# Patient Record
Sex: Male | Born: 1940
Health system: Southern US, Community
[De-identification: ages and names within clinical notes are randomized; demographics above are authoritative.]

## PROBLEM LIST (undated history)

## (undated) DIAGNOSIS — N183 Chronic kidney disease, stage 3 unspecified: Secondary | ICD-10-CM

## (undated) DIAGNOSIS — E785 Hyperlipidemia, unspecified: Secondary | ICD-10-CM

## (undated) DIAGNOSIS — I2510 Atherosclerotic heart disease of native coronary artery without angina pectoris: Secondary | ICD-10-CM

## (undated) DIAGNOSIS — T7840XA Allergy, unspecified, initial encounter: Secondary | ICD-10-CM

## (undated) DIAGNOSIS — I4819 Other persistent atrial fibrillation: Secondary | ICD-10-CM

## (undated) DIAGNOSIS — I10 Essential (primary) hypertension: Secondary | ICD-10-CM

## (undated) DIAGNOSIS — R945 Abnormal results of liver function studies: Secondary | ICD-10-CM

## (undated) DIAGNOSIS — E669 Obesity, unspecified: Secondary | ICD-10-CM

## (undated) DIAGNOSIS — I5042 Chronic combined systolic (congestive) and diastolic (congestive) heart failure: Secondary | ICD-10-CM

## (undated) DIAGNOSIS — S2249XA Multiple fractures of ribs, unspecified side, initial encounter for closed fracture: Secondary | ICD-10-CM

## (undated) DIAGNOSIS — J189 Pneumonia, unspecified organism: Secondary | ICD-10-CM

## (undated) HISTORY — DX: Multiple fractures of ribs, unspecified side, initial encounter for closed fracture: S22.49XA

## (undated) HISTORY — DX: Hyperlipidemia, unspecified: E78.5

## (undated) HISTORY — DX: Atherosclerotic heart disease of native coronary artery without angina pectoris: I25.10

## (undated) HISTORY — DX: Allergy, unspecified, initial encounter: T78.40XA

## (undated) HISTORY — DX: Hypocalcemia: E83.51

## (undated) HISTORY — DX: Pneumonia, unspecified organism: J18.9

## (undated) HISTORY — DX: Obesity, unspecified: E66.9

## (undated) HISTORY — DX: Essential (primary) hypertension: I10

## (undated) HISTORY — DX: Abnormal results of liver function studies: R94.5

---
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
## 1973-05-15 HISTORY — PX: APPENDECTOMY: SHX54

## 1983-05-16 HISTORY — PX: RETINAL LASER PROCEDURE: SHX2339

## 2000-12-06 ENCOUNTER — Encounter: Admission: RE | Admit: 2000-12-06 | Discharge: 2000-12-06 | Payer: Self-pay | Admitting: Family Medicine

## 2000-12-06 ENCOUNTER — Encounter: Payer: Self-pay | Admitting: Family Medicine

## 2004-12-14 ENCOUNTER — Ambulatory Visit: Payer: Self-pay

## 2005-03-28 ENCOUNTER — Ambulatory Visit (HOSPITAL_COMMUNITY): Admission: RE | Admit: 2005-03-28 | Discharge: 2005-03-29 | Payer: Self-pay | Admitting: Ophthalmology

## 2007-10-01 ENCOUNTER — Encounter (INDEPENDENT_AMBULATORY_CARE_PROVIDER_SITE_OTHER): Payer: Self-pay | Admitting: Ophthalmology

## 2007-10-01 ENCOUNTER — Ambulatory Visit (HOSPITAL_COMMUNITY): Admission: RE | Admit: 2007-10-01 | Discharge: 2007-10-02 | Payer: Self-pay | Admitting: Ophthalmology

## 2008-05-15 HISTORY — PX: CATARACT EXTRACTION: SUR2

## 2009-05-23 ENCOUNTER — Emergency Department (HOSPITAL_COMMUNITY): Admission: EM | Admit: 2009-05-23 | Discharge: 2009-05-23 | Payer: Self-pay | Admitting: Emergency Medicine

## 2010-07-27 ENCOUNTER — Encounter: Payer: Self-pay | Admitting: Family Medicine

## 2010-08-02 NOTE — Letter (Signed)
Summary: Records Dated 10-10 thru 7-11/[facility]  Records Dated 10-10 thru 7-11/[facility]   Imported By: Lanelle Bal 07/27/2010 14:19:42  _____________________________________________________________________  External Attachment:    Type:   Image     Comment:   External Document

## 2010-08-04 ENCOUNTER — Ambulatory Visit (INDEPENDENT_AMBULATORY_CARE_PROVIDER_SITE_OTHER): Payer: Medicare Other | Admitting: Family Medicine

## 2010-08-04 ENCOUNTER — Encounter: Payer: Self-pay | Admitting: Family Medicine

## 2010-08-04 DIAGNOSIS — R0989 Other specified symptoms and signs involving the circulatory and respiratory systems: Secondary | ICD-10-CM

## 2010-08-04 DIAGNOSIS — E785 Hyperlipidemia, unspecified: Secondary | ICD-10-CM

## 2010-08-04 DIAGNOSIS — I10 Essential (primary) hypertension: Secondary | ICD-10-CM

## 2010-08-04 DIAGNOSIS — E669 Obesity, unspecified: Secondary | ICD-10-CM

## 2010-08-04 NOTE — Patient Instructions (Addendum)
Diabetes, Type 2 Diabetes is a lasting (chronic) disease. In type 2 diabetes, the pancreas does not make enough insulin (a hormone), and the body does not respond normally to the insulin that is made. This type of diabetes was also previously called adult onset diabetes. About 90% of all those who have diabetes have type 2. It usually occurs after the age of 22 but can occur at any age. CAUSES Unlike type 1 diabetes, which happens because insulin is no longer being made, type 2 diabetes happens because the body is making less insulin and has trouble using the insulin properly. SYMPTOMS  Drinking more than usual.   Urinating more than usual.   Blurred vision.   Dry, itchy skin.   Frequent infection like yeast infections in women.   More tired than usual (fatigue).  TREATMENT  Healthy eating.   Exercise.   Medication, if needed.   Monitoring blood glucose (sugar).   Seeing your caregiver regularly.  HOME CARE INSTRUCTIONS  Check your blood glucose (sugar) at least once daily. More frequent monitoring may be necessary, depending on your medications and on how well your diabetes is controlled. Your caregiver will advise you.   Take your medicine as directed by your caregiver.   Do not smoke.   Make wise food choices. Ask your caregiver for information. Weight loss can improve your diabetes.   Learn about low blood glucose (hypoglycemia) and how to treat it.   Get your eyes checked regularly.   Have a yearly physical exam. Have your blood pressure checked. Get your blood and urine tested.   Wear a pendant or bracelet saying that you have diabetes.   Check your feet every night for sores. Let your caregiver know if you have sores that are not healing.  SEEK MEDICAL CARE IF:  You are having problems keeping your blood glucose at target range.   You feel you might be having problems with your medicines.   You have symptoms of an illness that is not improving after 24  hours.   You have a sore or wound that is not healing.   You notice a change in vision or a new problem with your vision.   You develop a fever of more than 104F.  Document Released: 05/01/2005 Document Re-Released: 05/23/2009 [facility] Patient Information 2011 [place], [place].  Check BP upon arrival home and takes meds, check BP daily for the rest of the week and if it remains elevated over 150/90 call for further instructions Check BS daily and as needed Release of Records Maryruth Hancock Mezawski (sp?) Release of Records Dr Jimmey Ralph, Optometry, last visit.  Release of Records Dr Thayer Headings  Will check insurance and administer Zostavax at next visit.

## 2010-08-05 ENCOUNTER — Encounter: Payer: Self-pay | Admitting: Family Medicine

## 2010-08-05 ENCOUNTER — Other Ambulatory Visit (INDEPENDENT_AMBULATORY_CARE_PROVIDER_SITE_OTHER): Payer: BLUE CROSS/BLUE SHIELD

## 2010-08-05 DIAGNOSIS — J4530 Mild persistent asthma, uncomplicated: Secondary | ICD-10-CM | POA: Insufficient documentation

## 2010-08-05 DIAGNOSIS — T7840XA Allergy, unspecified, initial encounter: Secondary | ICD-10-CM | POA: Insufficient documentation

## 2010-08-05 DIAGNOSIS — E669 Obesity, unspecified: Secondary | ICD-10-CM | POA: Insufficient documentation

## 2010-08-05 DIAGNOSIS — E785 Hyperlipidemia, unspecified: Secondary | ICD-10-CM | POA: Insufficient documentation

## 2010-08-05 DIAGNOSIS — R0989 Other specified symptoms and signs involving the circulatory and respiratory systems: Secondary | ICD-10-CM | POA: Insufficient documentation

## 2010-08-05 DIAGNOSIS — E119 Type 2 diabetes mellitus without complications: Secondary | ICD-10-CM | POA: Insufficient documentation

## 2010-08-05 DIAGNOSIS — I10 Essential (primary) hypertension: Secondary | ICD-10-CM

## 2010-08-05 HISTORY — DX: Other specified symptoms and signs involving the circulatory and respiratory systems: R09.89

## 2010-08-05 MED ORDER — METFORMIN HCL 1000 MG PO TABS: 1000.0000 mg | ORAL_TABLET | Freq: Two times a day (BID) | ORAL | Status: DC

## 2010-08-05 MED ORDER — ALBUTEROL SULFATE HFA 108 (90 BASE) MCG/ACT IN AERS: 2.0000 | INHALATION_SPRAY | RESPIRATORY_TRACT | Status: DC | PRN

## 2010-08-05 MED ORDER — ROSUVASTATIN CALCIUM 40 MG PO TABS: 40.0000 mg | ORAL_TABLET | Freq: Every day | ORAL | Status: DC

## 2010-08-05 MED ORDER — LISINOPRIL-HYDROCHLOROTHIAZIDE 20-12.5 MG PO TABS: 2.0000 | ORAL_TABLET | Freq: Every day | ORAL | Status: DC

## 2010-08-05 MED ORDER — CHOLINE FENOFIBRATE 135 MG PO CPDR: 135.0000 mg | DELAYED_RELEASE_CAPSULE | Freq: Every day | ORAL | Status: DC

## 2010-08-05 MED ORDER — AMLODIPINE BESYLATE 5 MG PO TABS: 5.0000 mg | ORAL_TABLET | Freq: Every day | ORAL | Status: DC

## 2010-08-05 NOTE — Assessment & Plan Note (Signed)
Very infrequent use of Albuterol at this time, will provide refill

## 2010-08-05 NOTE — Assessment & Plan Note (Signed)
Patient did not take his meds this am. He agrees to go home and take his meds and then monitor his BP, notify us if still stays hi. Minimize sodium. Reassess at next visit

## 2010-08-05 NOTE — Assessment & Plan Note (Signed)
Patient asked to consider the DASH diet, maintain a hi fiber, lowfat diet with small, frequent meals and increased exercise

## 2010-08-05 NOTE — Progress Notes (Addendum)
Subjective:    Patient ID: David Huynh, male    DOB: 1940/12/05, 70 y.o.   MRN: [id number]  HPI patient is in today to establish care for a new patient appointment. He has a complicated past medical history which includes hypertension, hyperlipidemia, diabetes and obesity. He had been in generally good health until about 2 weeks ago when he developed increasing nasal congestion, postnasal drip and malaise. His nasal congestion is occasionally productive but generally is not, he has not tried any OTC meds for his symptoms to date. He denies any fevers, headache, chills, chest pain, palpitations, shortness of breath, GI or GU complaints. Patient reports his hemoglobin A1c tends to range in the 6-7 area. He notes his blood sugars tend to be labile some mornings they're good another one if they're above 150 normal he however they are below 150. No recent change in medications immunologist she is not good about following a diabetic diet and will often eat too many carbohydrates. He is not avoiding sodium which controlled his blood pressure but he does take his blood pressure medications regularly. He also not as he does not follow any dietary restrictions as regards his hyperlipidemia although he does try to eat a relatively well-developed light and minimize his saturated fats.    Review of Systems  Constitutional: Negative for fever, chills, activity change, appetite change and fatigue.  HENT: Positive for congestion and postnasal drip. Negative for ear pain, nosebleeds, rhinorrhea, sneezing, neck pain and neck stiffness.   Eyes: Negative for pain and discharge.  Respiratory: Negative for apnea, cough, choking, chest tightness, shortness of breath and wheezing.   Cardiovascular: Negative for chest pain, palpitations and leg swelling.  Gastrointestinal: Negative for nausea, vomiting, abdominal pain, diarrhea, constipation, blood in stool and abdominal distention.  Genitourinary: Negative for dysuria,  urgency, frequency, hematuria, flank pain, enuresis and difficulty urinating.  Musculoskeletal: Negative for myalgias, back pain and arthralgias.  Skin: Negative for color change, pallor and rash.  Neurological: Negative for dizziness, seizures, syncope, light-headedness and headaches.  Psychiatric/Behavioral: Negative for confusion and agitation.   Past Medical History  Diagnosis Date  . Hypertension   . Hyperlipidemia   . Obesity   . Diabetes mellitus     type 2  . Asthma     mild, intermittent  . Allergy     seasonal   Past Surgical History  Procedure Date  . Appendectomy 1975  . Tonsillectomy and adenoidectomy 1947  . Cataract extraction 2010    b/l  . Retinal laser procedure 1985  . History   Social History  . Marital Status: Married    Spouse Name: N/A    Number of Children: N/A  . Years of Education: N/A   Occupational History  . Not on file.   Social History Main Topics  . Smoking status: Never Smoker   . Smokeless tobacco: Never Used  . Alcohol Use: Yes     wine on weekends w/dinner  . Drug Use: No  . Sexually Active: Yes -- Male partner(s)   Other Topics Concern  . Not on file   Social History Narrative  . No narrative on file  No Known Allergies      Objective:   Physical Exam  Constitutional: He is oriented to person, place, and time. He appears well-developed.  HENT:  Head: Normocephalic and atraumatic.  Left Ear: External ear normal.  Nose: Nose normal.  Mouth/Throat: Oropharynx is clear and moist.  Eyes: Conjunctivae and EOM are normal. Pupils  are equal, round, and reactive to light. No scleral icterus.  Neck: Normal range of motion. No JVD present. No thyromegaly present.  Cardiovascular: Normal rate and normal heart sounds.  Exam reveals no gallop.   No murmur heard. Pulses:      Carotid pulses are 0 on the right side, and 1+ on the left side with bruit. Pulmonary/Chest: Effort normal and breath sounds normal. No respiratory  distress. He has no wheezes. He has no rales. He exhibits no tenderness.  Abdominal: Soft. He exhibits no distension and no mass. There is no tenderness. There is no rebound and no guarding.  Musculoskeletal: Normal range of motion. He exhibits no edema and no tenderness.  Lymphadenopathy:    He has no cervical adenopathy.  Neurological: He is alert and oriented to person, place, and time. He has normal reflexes. He displays normal reflexes.  Skin: Skin is warm and dry. No rash noted. No erythema.  Psychiatric: He has a normal mood and affect. Judgment and thought content normal.          Assessment & Plan:  Obesity Patient asked to consider the DASH diet, maintain a hi fiber, lowfat diet with small, frequent meals and increased exercise  Hypertension Patient did not take his meds this am. He agrees to go home and take his meds and then monitor his BP, notify us if still stays hi. Minimize sodium. Reassess at next visit  Hyperlipidemia Patient is encouraged to avoid trans fats and increase exercise. Continue current meds and check FLP next week.  Diabetes mellitus Patient reports good HGBA1Cs ranging in the 6-7 range. Will check HGBA1C next week and reassess  Asthma Very infrequent use of Albuterol at this time, will provide refill

## 2010-08-05 NOTE — Assessment & Plan Note (Signed)
Patient reports good HGBA1Cs ranging in the 6-7 range. Will check HGBA1C next week and reassess

## 2010-08-05 NOTE — Telephone Encounter (Signed)
RX sent to pharmacy per md

## 2010-08-05 NOTE — Assessment & Plan Note (Signed)
Patient with previously undiagnosed carotid bruit on left will check an ultrasound and reevaluate

## 2010-08-05 NOTE — Assessment & Plan Note (Signed)
Patient is encouraged to avoid trans fats and increase exercise. Continue current meds and check FLP next week.

## 2010-08-22 ENCOUNTER — Encounter: Payer: BLUE CROSS/BLUE SHIELD | Admitting: Cardiology

## 2010-08-30 ENCOUNTER — Other Ambulatory Visit: Payer: Self-pay | Admitting: Family Medicine

## 2010-08-30 DIAGNOSIS — R0989 Other specified symptoms and signs involving the circulatory and respiratory systems: Secondary | ICD-10-CM

## 2010-08-31 ENCOUNTER — Encounter (INDEPENDENT_AMBULATORY_CARE_PROVIDER_SITE_OTHER): Payer: Medicare Other | Admitting: Cardiology

## 2010-08-31 DIAGNOSIS — R0989 Other specified symptoms and signs involving the circulatory and respiratory systems: Secondary | ICD-10-CM

## 2010-09-05 ENCOUNTER — Encounter: Payer: Self-pay | Admitting: Family Medicine

## 2010-09-05 ENCOUNTER — Ambulatory Visit: Payer: Medicare Other | Admitting: Family Medicine

## 2010-09-05 ENCOUNTER — Ambulatory Visit (INDEPENDENT_AMBULATORY_CARE_PROVIDER_SITE_OTHER): Payer: Medicare Other | Admitting: Family Medicine

## 2010-09-05 DIAGNOSIS — E119 Type 2 diabetes mellitus without complications: Secondary | ICD-10-CM

## 2010-09-05 DIAGNOSIS — Z0000 Encounter for general adult medical examination without abnormal findings: Secondary | ICD-10-CM

## 2010-09-05 DIAGNOSIS — E669 Obesity, unspecified: Secondary | ICD-10-CM

## 2010-09-05 DIAGNOSIS — R0989 Other specified symptoms and signs involving the circulatory and respiratory systems: Secondary | ICD-10-CM

## 2010-09-05 DIAGNOSIS — N400 Enlarged prostate without lower urinary tract symptoms: Secondary | ICD-10-CM

## 2010-09-05 DIAGNOSIS — T7840XA Allergy, unspecified, initial encounter: Secondary | ICD-10-CM

## 2010-09-05 DIAGNOSIS — E785 Hyperlipidemia, unspecified: Secondary | ICD-10-CM

## 2010-09-05 DIAGNOSIS — I10 Essential (primary) hypertension: Secondary | ICD-10-CM

## 2010-09-05 LAB — CBC WITH DIFFERENTIAL/PLATELET
Eosinophils Absolute: 0.3 10*3/uL (ref 0.0–0.7)
Eosinophils Relative: 2.6 % (ref 0.0–5.0)
MCV: 92.3 fl (ref 78.0–100.0)
Monocytes Absolute: 1 10*3/uL (ref 0.1–1.0)
Neutrophils Relative %: 58.4 % (ref 43.0–77.0)
Platelets: 184 10*3/uL (ref 150.0–400.0)
WBC: 9.9 10*3/uL (ref 4.5–10.5)

## 2010-09-05 LAB — LIPID PANEL
Cholesterol: 156 mg/dL (ref 0–200)
Triglycerides: 290 mg/dL — ABNORMAL HIGH (ref 0.0–149.0)

## 2010-09-05 LAB — RENAL FUNCTION PANEL
Albumin: 4.4 g/dL (ref 3.5–5.2)
BUN: 19 mg/dL (ref 6–23)
CO2: 32 mEq/L (ref 19–32)
Calcium: 9.6 mg/dL (ref 8.4–10.5)
Creatinine, Ser: 1.2 mg/dL (ref 0.4–1.5)

## 2010-09-05 LAB — HEPATIC FUNCTION PANEL
ALT: 93 U/L — ABNORMAL HIGH (ref 0–53)
AST: 79 U/L — ABNORMAL HIGH (ref 0–37)
Total Bilirubin: 0.7 mg/dL (ref 0.3–1.2)

## 2010-09-05 LAB — MICROALBUMIN / CREATININE URINE RATIO: Microalb, Ur: 15 mg/dL — ABNORMAL HIGH (ref 0.0–1.9)

## 2010-09-05 LAB — LDL CHOLESTEROL, DIRECT: Direct LDL: 69.8 mg/dL

## 2010-09-05 LAB — TSH: TSH: 1.47 u[IU]/mL (ref 0.35–5.50)

## 2010-09-05 LAB — HEMOGLOBIN A1C: Hgb A1c MFr Bld: 8.3 % — ABNORMAL HIGH (ref 4.6–6.5)

## 2010-09-05 NOTE — Assessment & Plan Note (Signed)
Well controlled, no change in meds 

## 2010-09-05 NOTE — Assessment & Plan Note (Signed)
No recent flare, has not needed Albuterol recently

## 2010-09-05 NOTE — Assessment & Plan Note (Signed)
Mild and treated with OTC meds prn, no changes

## 2010-09-05 NOTE — Assessment & Plan Note (Signed)
Avoiding trans fats, continue Crestor and check lipid panel

## 2010-09-05 NOTE — Assessment & Plan Note (Signed)
Carotid doppler results reviewed with patient, no concerns identified

## 2010-09-05 NOTE — Patient Instructions (Signed)
Diabetes, Type 2 Diabetes is a lasting (chronic) disease. In type 2 diabetes, the pancreas does not make enough insulin (a hormone), and the body does not respond normally to the insulin that is made. This type of diabetes was also previously called adult onset diabetes. About 90% of all those who have diabetes have type 2. It usually occurs after the age of 40 but can occur at any age. CAUSES Unlike type 1 diabetes, which happens because insulin is no longer being made, type 2 diabetes happens because the body is making less insulin and has trouble using the insulin properly. SYMPTOMS  Drinking more than usual.   Urinating more than usual.   Blurred vision.   Dry, itchy skin.   Frequent infection like yeast infections in women.   More tired than usual (fatigue).  TREATMENT  Healthy eating.   Exercise.   Medication, if needed.   Monitoring blood glucose (sugar).   Seeing your caregiver regularly.  HOME CARE INSTRUCTIONS  Check your blood glucose (sugar) at least once daily. More frequent monitoring may be necessary, depending on your medications and on how well your diabetes is controlled. Your caregiver will advise you.   Take your medicine as directed by your caregiver.   Do not smoke.   Make wise food choices. Ask your caregiver for information. Weight loss can improve your diabetes.   Learn about low blood glucose (hypoglycemia) and how to treat it.   Get your eyes checked regularly.   Have a yearly physical exam. Have your blood pressure checked. Get your blood and urine tested.   Wear a pendant or bracelet saying that you have diabetes.   Check your feet every night for sores. Let your caregiver know if you have sores that are not healing.  SEEK MEDICAL CARE IF:  You are having problems keeping your blood glucose at target range.   You feel you might be having problems with your medicines.   You have symptoms of an illness that is not improving after 24  hours.   You have a sore or wound that is not healing.   You notice a change in vision or a new problem with your vision.   You develop a fever of more than 104.  Document Released: 05/01/2005 Document Re-Released: 05/23/2009 [employer] Patient Information 2011 [employer]. 

## 2010-09-05 NOTE — Progress Notes (Signed)
David Huynh [id number] 04/01/1941 09/05/2010      Progress Note-Follow Up  Subjective  Chief Complaint  Chief Complaint  Patient presents with  . Follow-up    follow up    HPI  Patient is a 70 year old Caucasian male in today for followup on multiple medical problems including diabetes and hypertension. He reports recent good control of his blood sugars his a.m. sugars ranging between 90 and 110. He denies polyuria polydipsia, fatigue. He reports his breathing and asthma well controlled he has not needed his albuterol since his last visit. He denied any chest pain, palpitations, GI or GU complaints. He denies any chest chin that is out of the ordinary. He does have low-grade allergies is not taking any medications for this at this time.  Past Medical History  Diagnosis Date  . Hyperlipidemia   . Obesity   . Allergy     seasonal  . Asthma     mild, intermittent  . Diabetes mellitus     type 2  . Hypertension     Past Surgical History  Procedure Date  . Appendectomy 1975  . Tonsillectomy and adenoidectomy 1947  . Cataract extraction 2010    b/l  . Retinal laser procedure 1985    History reviewed. No pertinent family history.  History   Social History  . Marital Status: Married    Spouse Name: N/A    Number of Children: N/A  . Years of Education: N/A   Occupational History  . Not on file.   Social History Main Topics  . Smoking status: Never Smoker   . Smokeless tobacco: Never Used  . Alcohol Use: Yes     wine on weekends w/dinner  . Drug Use: No  . Sexually Active: Yes -- Male partner(s)   Other Topics Concern  . Not on file   Social History Narrative  . No narrative on file    Current Outpatient Prescriptions on File Prior to Visit  Medication Sig Dispense Refill  . albuterol (VENTOLIN HFA) 108 (90 BASE) MCG/ACT inhaler Inhale 2 puffs into the lungs as needed.  1 Inhaler  3  . amLODipine (NORVASC) 5 MG tablet Take 1 tablet (5 mg total) by  mouth daily.  30 tablet  3  . Choline Fenofibrate (TRILIPIX) 135 MG capsule Take 1 capsule (135 mg total) by mouth daily.  30 capsule  3  . lisinopril-hydrochlorothiazide (PRINZIDE,ZESTORETIC) 20-12.5 MG per tablet Take 2 tablets by mouth daily.  60 tablet  3  . metFORMIN (GLUCOPHAGE) 1000 MG tablet Take 1 tablet (1,000 mg total) by mouth 2 (two) times daily with a meal.  60 tablet  3  . rosuvastatin (CRESTOR) 40 MG tablet Take 1 tablet (40 mg total) by mouth daily.  30 tablet  3    No Known Allergies  Review of Systems  Review of Systems  Constitutional: Negative for fever and malaise/fatigue.  HENT: Negative for congestion.   Eyes: Negative for discharge.  Respiratory: Negative for shortness of breath.   Cardiovascular: Negative for chest pain, palpitations and leg swelling.  Gastrointestinal: Negative for nausea, abdominal pain and diarrhea.  Genitourinary: Negative for dysuria.  Musculoskeletal: Negative for falls.  Skin: Negative for rash.  Neurological: Negative for loss of consciousness and headaches.  Endo/Heme/Allergies: Negative for polydipsia.  Psychiatric/Behavioral: Negative for depression and suicidal ideas. The patient is not nervous/anxious and does not have insomnia.     Objective  BP 134/77  Pulse 84  Temp(Src) 97.9 F (36.6 C) (  Oral)  Ht 6' (1.829 m)  Wt 283 lb 6.4 oz (128.549 kg)  BMI 38.44 kg/m2  SpO2 97%  Physical Exam  Physical Exam  Constitutional: He is oriented to person, place, and time and well-developed, well-nourished, and in no distress. No distress.  HENT:  Head: Normocephalic and atraumatic.  Eyes: Conjunctivae are normal.  Neck: Neck supple. No thyromegaly present.  Cardiovascular: Normal rate, regular rhythm and normal heart sounds.   No murmur heard. Pulmonary/Chest: Effort normal and breath sounds normal. No respiratory distress.  Abdominal: He exhibits no distension and no mass. There is no tenderness.  Musculoskeletal: He  exhibits no edema.  Neurological: He is alert and oriented to person, place, and time.  Skin: Skin is warm.  Psychiatric: Memory, affect and judgment normal.       Assessment & Plan  Obesity Patient has been trying to stay more active and avoiding trans fats, encouraged small, frequent meals  Hypertension Well controlled, no change in meds  Hyperlipidemia Avoiding trans fats, continue Crestor and check lipid panel  Diabetes mellitus Doing well, fasting sugars at some ranging from 90 to 110, avoid simple carbs, small frequent meals. Check a hgba1c  Carotid bruit Carotid doppler results reviewed with patient, no concerns identified  Asthma No recent flare, has not needed Albuterol recently  Allergic state Mild and treated with OTC meds prn, no changes

## 2010-09-05 NOTE — Assessment & Plan Note (Signed)
Doing well, fasting sugars at some ranging from 90 to 110, avoid simple carbs, small frequent meals. Check a hgba1c

## 2010-09-05 NOTE — Assessment & Plan Note (Signed)
Patient has been trying to stay more active and avoiding trans fats, encouraged small, frequent meals

## 2010-09-13 ENCOUNTER — Telehealth: Payer: Self-pay

## 2010-09-13 DIAGNOSIS — K760 Fatty (change of) liver, not elsewhere classified: Secondary | ICD-10-CM

## 2010-09-13 DIAGNOSIS — K769 Liver disease, unspecified: Secondary | ICD-10-CM

## 2010-09-13 DIAGNOSIS — E669 Obesity, unspecified: Secondary | ICD-10-CM

## 2010-09-13 NOTE — Telephone Encounter (Signed)
Patient called back and is unsure if [place] has been done in the recent past, will set up testing

## 2010-09-13 NOTE — Telephone Encounter (Signed)
Pt spoke with Diane and made appt for labs and would now like to proceed with [place]. Please send referral    Result Notes     Notes Recorded by Josph Macho, MA on 09/07/2010 at 9:18 AM Pt informed and states he has already done an ultrasound and everything was fine. Pt doesn't want to proceed with the [place]. ------  Notes Recorded by Danise Edge on 09/06/2010 at 3:27 PM Notify hgba1c is 8.3, minimize simple carbs, increase exercise and add Metformin 500mg  po q noon to his other doses. Cholesterol is still up some, would add Niacin ER 500 mg po qhs with a lowfat snack and a baby aspirin 1/2 hour before. Would also recommend he start MegaRed fish oil caps 1 daily, then recheck fasting labs in 3 months. Finally his LFTs are up some, would recommend a RUQ [place] to look for fatty liver or gallstones and in 1 mn recheck lfts and an acute hepatitis panel. Could make that check an appt to further discuss labs and sugars if he would like.

## 2010-09-16 ENCOUNTER — Other Ambulatory Visit: Payer: Medicare Other

## 2010-09-19 ENCOUNTER — Ambulatory Visit: Payer: Medicare Other | Admitting: Family Medicine

## 2010-09-20 ENCOUNTER — Other Ambulatory Visit: Payer: Self-pay

## 2010-09-20 MED ORDER — METFORMIN HCL 1000 MG PO TABS: 1000.0000 mg | ORAL_TABLET | Freq: Three times a day (TID) | ORAL | Status: DC

## 2010-09-27 NOTE — Op Note (Signed)
NAME:  David Huynh, David Huynh             ACCOUNT NO.:  [id number]   MEDICAL RECORD NO.:  [id number]          PATIENT TYPE:  AMB   LOCATION:  SDS                          FACILITY:  [facility]   PHYSICIAN:  John D. Ashley Royalty, M.D. DATE OF BIRTH:  Dec 05, 1940   DATE OF PROCEDURE:  09/30/2007  DATE OF DISCHARGE:                               OPERATIVE REPORT   ADMISSION DIAGNOSES:  Retained lens material, left eye; dislocated  intraocular lens into the vitreous, left eye; and capsular remnants,  left eye.   PROCEDURES:  Pars plana vitrectomy with 25-gauge system, removal of  intraocular lens from vitreous, placement of secondary intraocular lens  with suture, posterior capsulectomy and gas injection, all in the left  eye.   SURGEON:  Beulah Gandy. Ashley Royalty, M.D..   ASSISTANT:  Rosalie Doctor, MA.   ANESTHESIA:  General.   DETAILS:  After proper endotracheal anesthesia, the usual prep and drape  was performed.  Conjunctival peritomy from 8 o'clock around to 4 o'clock  with blunt and sharp dissection.  Diathermy for hemostasis.  Half-  thickness scleral flaps were raised at 3 o'clock and 9 o'clock in  anticipation of IOL suture, 25-gauge trocars placed tangentially at 10,  2, and infusion at 4 o'clock.  Pars plana vitrectomy was begun just  behind the pupillary axis.  Some capsular remnants were seen and  carefully removed out with the vitreous cutting unit.  The vitrectomy  was carried into the anterior chamber where debris was removed from the  angle.  The vitrectomy was carried posteriorly and vitreous opacities  and debris were encountered and removed.  The intraocular lens was  encountered and it was ensnared in vitreous, this was carefully removed  with the vitreous cutter and the lighted pick.  The lens was freed from  its attachments to the vitreous.  The vitrectomy was stopped and a  diamond knife was used to form a three-layered corneoscleral wound from  10 o'clock to 2 o'clock, 7.5 mm in  length.  The wound was opened into  the anterior chamber with the diamond knife.  The vitrectomy was  restarted and additional vitreous was removed.  The 25-gauge forceps  were used to grasp the intraocular lens and passed it into the anterior  chamber.  It was then removed from the eye with 20-gauge serrated  forceps through the corneoscleral wound.  This was sent for an  identification.  Additional vitrectomy was performed at this time so  that all remnants were removed.  Two 10-0 Prolene sutures were passed  from 3 o'clock to 9 o'clock through the ciliary sulcus behind the iris  and anterior to the capsular remnants.  These sutures were passed, they  were originated and ended up beneath the scleral flaps.  The sutures  were externalized through the corneoscleral wound.  A new intraocular  lens model [employer] CR 70BU power 6.0 D, length 13.5 mm, optic  70.0 mm; serial numbers [id number], expiration date March, 2014, was  brought onto the field.  It was inspected and cleaned.  The sutures were  attached to the eyelets of the  lens.  The lens was passed into the  anterior chamber then into the posterior chamber.  It was dialed into  place and the Prolene sutures were drawn securely beneath the scleral  flaps.  They were knotted and the free ends removed.  The scleral flaps  were allowed to lie on top of the knots.  Additional vitrectomy was  carried out again to ensure that debris was removed from the vitreous  cavity.  Once the vitreous was totally cleaned, the instruments and  trocars were removed.  The wounds were tested and found to be tight.  The corneoscleral wound was closed with four interrupted 10-0 nylon  sutures.  It was tested and found to be tight.  A 210 mL injection of  gas obtained a closing pressure of 10/10 with a Barraquer tonometer.  The conjunctiva was closed with 7-0 chromic suture.  Polymyxin and  gentamicin were irrigated in the tenon space.  Decadron 10  mg was  injected into the lower subconjunctival space.  Marcaine was  injected around the globe for postop pain.  TobraDex ophthalmic  ointment, patch and sheath were placed.  The complications were none.  Operative time, 1 hour 40 minutes.  The patient was awakened and taken  to recovery in satisfactory condition.      Beulah Gandy. Ashley Royalty, M.D.  Electronically Signed     JDM/MEDQ  D:  10/01/2007  T:  10/02/2007  Job:  [id number]

## 2010-09-30 NOTE — Op Note (Signed)
NAME:  David Huynh, David Huynh             ACCOUNT NO.:  [id number]   MEDICAL RECORD NO.:  [id number]          PATIENT TYPE:  AMB   LOCATION:  SDS                          FACILITY:  [facility]   PHYSICIAN:  John D. Ashley Royalty, M.D. 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   PROCEDURE:  Pars plana vitrectomy, membrane peel, retinal photocoagulation,  gas fluid exchange in the right eye.   SURGEON:  Alan Mulder.   ASSISTANT:  Rosalie Doctor.   ANESTHESIA:  General.   DETAILS:  Usual prep and drape, peritomies at 10, 2 and 4 o'clock.  5 mm  infusion port at 4 o'clock with internal verification.  Contact lens ring  anchored into place at 6 and 12 o'clock. Methylcellulose placed on the  corneal surface and the flat contact lens was placed. Pars plana vitrectomy  was begun just behind the cataractous lens. The posterior subcapsular  cataract was seen.  Vitreous membranes were encountered. These were  carefully removed under low suction and rapid cutting for 360 degrees.  Attention was carried to the macular surface where a glistening taut  membrane was seen bridging a gap on the temporal side of the fovea and  causing elevation of the foveal tissue and temporal tissue. There was only a  very slight elevation of this membrane, but it was glistening and was  mobile.  27-gauge pick was used to engage the membrane and strip it from its  attachment to the temporal fovea.  It was then peeled over the macular  region and up to the superior arcade. Once the membrane was pulled outside  of the arcade, it was removed with the vitreous cutter.  Kenalog was then  placed on the macular surface to stain any remaining membranes.  This was  carefully removed with the [place] ophthalmics brush and the diamond dusted  membrane scraper.  All  membranes were carefully removed. Vitrectomy is  carried out to the far periphery and down to the vitreous base for 360  degrees.  The indirect ophthalmoscope laser was used to inspect the retinal  periphery and place two rows of laser in the periphery.  627 burns were  placed with the power 400 milliwatts, 1000 microns each and 0.07 seconds  each and washout procedure was performed of vitreous. The instruments  removed from the eye and 9-0 nylon was used to close the sclerotomy sites.  They were tested and found to be tight.  0.2 mL of air was injected in the  vitreous cavity to reinflate the globe to a pressure of 15 with a Baer  keratometer.  The conjunctiva was closed with wet-field cautery. Polymyxin  and gentamicin were irrigated into Tenon's space, Decadron 10 milligrams was  injected to the lower subconjunctival space. Marcaine was injected around  the globe for postop pain. Tobradex ophthalmic ointment, patch and shield  were placed. The patient is awakened, taken to recovery in satisfactory  condition.  COMPLICATIONS:  None.   DURATION:  1 hour.      Beulah Gandy. Ashley Royalty, M.D.  Electronically Signed     JDM/MEDQ  D:  03/28/2005  T:  03/29/2005  Job:  [id number]

## 2010-10-06 ENCOUNTER — Other Ambulatory Visit: Payer: Medicare Other

## 2010-10-07 ENCOUNTER — Other Ambulatory Visit: Payer: Medicare Other

## 2010-10-17 ENCOUNTER — Ambulatory Visit
Admission: RE | Admit: 2010-10-17 | Discharge: 2010-10-17 | Disposition: A | Discharge status: Home / Self Care | Payer: Medicare Other | Source: Ambulatory Visit | Attending: Family Medicine | Admitting: Family Medicine

## 2010-10-17 DIAGNOSIS — K760 Fatty (change of) liver, not elsewhere classified: Secondary | ICD-10-CM

## 2010-10-18 ENCOUNTER — Other Ambulatory Visit (INDEPENDENT_AMBULATORY_CARE_PROVIDER_SITE_OTHER): Payer: Medicare Other

## 2010-10-18 DIAGNOSIS — R7989 Other specified abnormal findings of blood chemistry: Secondary | ICD-10-CM

## 2010-10-18 LAB — HEPATIC FUNCTION PANEL
ALT: 101 U/L — ABNORMAL HIGH (ref 0–53)
Bilirubin, Direct: 0.2 mg/dL (ref 0.0–0.3)
Total Bilirubin: 0.8 mg/dL (ref 0.3–1.2)

## 2010-11-02 ENCOUNTER — Telehealth: Payer: Self-pay | Admitting: Family Medicine

## 2010-11-02 MED ORDER — METFORMIN HCL 1000 MG PO TABS: 1000.0000 mg | ORAL_TABLET | Freq: Three times a day (TID) | ORAL | Status: DC

## 2010-11-02 NOTE — Telephone Encounter (Signed)
OK to fill with same sig, #90, 5 rf

## 2010-11-02 NOTE — Telephone Encounter (Signed)
Faxed rec'd from [facility] Metformin 1000MG  Tablets

## 2011-01-17 ENCOUNTER — Other Ambulatory Visit: Payer: Self-pay

## 2011-01-17 MED ORDER — LISINOPRIL-HYDROCHLOROTHIAZIDE 20-12.5 MG PO TABS: 2.0000 | ORAL_TABLET | Freq: Every day | ORAL | Status: DC

## 2011-02-08 LAB — BASIC METABOLIC PANEL
CO2: 22
Calcium: 9.1
Chloride: 103
GFR calc Af Amer: >60
Glucose, Bld: 158 — ABNORMAL HIGH
Potassium: 3.6
Sodium: 135

## 2011-02-08 LAB — CBC
Hemoglobin: 14.5
RBC: 4.62
RDW: 12

## 2011-03-22 ENCOUNTER — Telehealth: Payer: Self-pay | Admitting: Family Medicine

## 2011-03-23 NOTE — Telephone Encounter (Signed)
Yes have him do a hgba1c, lipids, hepatic, cbc, tsh, renal, microalb and acute hepatitis panel for hyperlipidemia, DM, elevated LFT's

## 2011-03-23 NOTE — Telephone Encounter (Signed)
Patient wife called to see if patient needs labwork.  Please advise and leave message on patients home telephone voicemail.

## 2011-03-23 NOTE — Telephone Encounter (Signed)
Please advise 

## 2011-03-24 NOTE — Telephone Encounter (Signed)
Patients spouse informed and call transferred to [facility] to schedule

## 2011-03-29 ENCOUNTER — Other Ambulatory Visit: Payer: Medicare Other

## 2011-04-03 ENCOUNTER — Other Ambulatory Visit: Payer: Self-pay

## 2011-04-03 ENCOUNTER — Ambulatory Visit: Payer: Medicare Other | Admitting: Family Medicine

## 2011-04-03 MED ORDER — AMLODIPINE BESYLATE 5 MG PO TABS: 5.0000 mg | ORAL_TABLET | Freq: Every day | ORAL | Status: DC

## 2011-04-17 ENCOUNTER — Other Ambulatory Visit (INDEPENDENT_AMBULATORY_CARE_PROVIDER_SITE_OTHER): Payer: Medicare Other

## 2011-04-17 ENCOUNTER — Other Ambulatory Visit: Payer: Self-pay | Admitting: Family Medicine

## 2011-04-17 DIAGNOSIS — E119 Type 2 diabetes mellitus without complications: Secondary | ICD-10-CM

## 2011-04-17 DIAGNOSIS — I10 Essential (primary) hypertension: Secondary | ICD-10-CM

## 2011-04-17 DIAGNOSIS — R7989 Other specified abnormal findings of blood chemistry: Secondary | ICD-10-CM

## 2011-04-17 DIAGNOSIS — E785 Hyperlipidemia, unspecified: Secondary | ICD-10-CM

## 2011-04-17 DIAGNOSIS — N400 Enlarged prostate without lower urinary tract symptoms: Secondary | ICD-10-CM

## 2011-04-17 LAB — RENAL FUNCTION PANEL
Calcium: 9.4 mg/dL (ref 8.4–10.5)
GFR: 89.88 mL/min (ref >60.00)
Glucose, Bld: 182 mg/dL — ABNORMAL HIGH (ref 70–99)
Potassium: 3.7 mEq/L (ref 3.5–5.1)
Sodium: 134 mEq/L — ABNORMAL LOW (ref 135–145)

## 2011-04-17 LAB — CBC
MCHC: 34.1 g/dL (ref 30.0–36.0)
MCV: 94 fl (ref 78.0–100.0)
RBC: 4.82 Mil/uL (ref 4.22–5.81)
RDW: 12.3 % (ref 11.5–14.6)

## 2011-04-17 LAB — HEPATIC FUNCTION PANEL
ALT: 130 U/L — ABNORMAL HIGH (ref 0–53)
Alkaline Phosphatase: 37 U/L — ABNORMAL LOW (ref 39–117)
Bilirubin, Direct: 0.1 mg/dL (ref 0.0–0.3)
Total Protein: 7.5 g/dL (ref 6.0–8.3)

## 2011-04-17 LAB — LIPID PANEL: HDL: 55.2 mg/dL (ref >39.00)

## 2011-04-17 LAB — TSH: TSH: 0.61 u[IU]/mL (ref 0.35–5.50)

## 2011-04-17 LAB — HEMOGLOBIN A1C: Hgb A1c MFr Bld: 7.3 % — ABNORMAL HIGH (ref 4.6–6.5)

## 2011-04-18 LAB — MICROALBUMIN / CREATININE URINE RATIO
Creatinine,U: 193 mg/dL
Microalb, Ur: 17.6 mg/dL — ABNORMAL HIGH (ref 0.0–1.9)

## 2011-04-24 ENCOUNTER — Ambulatory Visit: Payer: Medicare Other | Admitting: Family Medicine

## 2011-04-26 ENCOUNTER — Ambulatory Visit (INDEPENDENT_AMBULATORY_CARE_PROVIDER_SITE_OTHER): Payer: Medicare Other | Admitting: Family Medicine

## 2011-04-26 ENCOUNTER — Encounter: Payer: Self-pay | Admitting: Family Medicine

## 2011-04-26 VITALS — BP 152/96 | HR 100 | Temp 98.4°F | Ht 72.0 in | Wt 288.0 lb

## 2011-04-26 DIAGNOSIS — Z23 Encounter for immunization: Secondary | ICD-10-CM

## 2011-04-26 DIAGNOSIS — R945 Abnormal results of liver function studies: Secondary | ICD-10-CM

## 2011-04-26 DIAGNOSIS — E785 Hyperlipidemia, unspecified: Secondary | ICD-10-CM

## 2011-04-26 DIAGNOSIS — I10 Essential (primary) hypertension: Secondary | ICD-10-CM

## 2011-04-26 HISTORY — DX: Abnormal results of liver function studies: R94.5

## 2011-04-26 MED ORDER — COLESEVELAM HCL 625 MG PO TABS: 1875.0000 mg | ORAL_TABLET | Freq: Two times a day (BID) | ORAL | Status: DC

## 2011-04-26 NOTE — Assessment & Plan Note (Signed)
Elevated bp today but he did not take his meds today. He reports at home seeing systolics in the 120 to 130 range and diastolics between 70 to 90. Minimize sodium and recheck bp at next visit or sooner if numbers remain elevated on meds

## 2011-04-26 NOTE — Assessment & Plan Note (Signed)
Trending up, minimize alcohol, fatty foods and simple carbs, likely related to fatty liver disease, patient given educational handout and we will recheck levels in 3 months

## 2011-04-26 NOTE — Assessment & Plan Note (Signed)
hgba1c has dropped a point since increasing his metformin, continue same dosing and minimize carbs, calories and monitor sugars

## 2011-04-26 NOTE — Patient Instructions (Signed)
Fatty Liver Fatty liver is the accumulation of fat in liver cells. It is also called hepatosteatosis or steatohepatitis. It is normal for your liver to contain some fat. If fat is more than 5 to 10% of your liver's weight, you have fatty liver.  There are often no symptoms (problems) for years while damage is still occurring. People often learn about their fatty liver when they have medical tests for other reasons. Fat can damage your liver for years or even decades without causing problems. When it becomes severe, it can cause fatigue, weight loss, weakness, and confusion. This makes you more likely to develop more serious liver problems. The liver is the largest organ in the body. It does a lot of work and often gives no warning signs when it is sick until late in a disease. The liver has many important jobs including:  Breaking down foods.   Storing vitamins, iron, and other minerals.   Making proteins.   Making bile for food digestion.   Breaking down many products including medications, alcohol and some poisons.  CAUSES  There are a number of different conditions, medications, and poisons that can cause a fatty liver. Eating too many calories causes fat to build up in the liver. Not processing and breaking fats down normally may also cause this. Certain conditions, such as obesity, diabetes, and high triglycerides also cause this. Most fatty liver patients tend to be middle-aged and over weight.  Some causes of fatty liver are:  Alcohol over consumption.   Malnutrition.   Steroid use.   Valproic acid toxicity.   Obesity.   Cushing's syndrome.   Poisons.   Tetracycline in high dosages.   Pregnancy.   Diabetes.   Hyperlipidemia.   Rapid weight loss.  Some people develop fatty liver even having none of these conditions. SYMPTOMS  Fatty liver most often causes no problems. This is called asymptomatic.  It can be diagnosed with blood tests and also by a liver biopsy.    It is one of the most common causes of minor elevations of liver enzymes on routine blood tests.   Specialized Imaging of the liver using ultrasound, CT (computed tomography) scan, or MRI (magnetic resonance imaging) can suggest a fatty liver but a biopsy is needed to confirm it.   A biopsy involves taking a small sample of liver tissue. This is done by using a needle. It is then looked at under a microscope by a specialist.  TREATMENT  It is important to treat the cause. Simple fatty liver without a medical reason may not need treatment.  Weight loss, fat restriction, and exercise in overweight patients produces inconsistent results but is worth trying.   Fatty liver due to alcohol toxicity may not improve even with stopping drinking.   Good control of diabetes may reduce fatty liver.   Lower your triglycerides through diet, medication or both.   Eat a balanced, healthy diet.   Increase your physical activity.   Get regular checkups from a liver specialist.   There are no medical or surgical treatments for a fatty liver or NASH, but improving your diet and increasing your exercise may help prevent or reverse some of the damage.  PROGNOSIS  Fatty liver may cause no damage or it can lead to an inflammation of the liver. This is, called steatohepatitis. When it is linked to alcohol abuse, it is called alcoholic steatohepatitis. It often is not linked to alcohol. It is then called nonalcoholic steatohepatitis, or NASH. Over  time the liver may become scarred and hardened. This condition is called cirrhosis. Cirrhosis is serious and may lead to liver failure or cancer. NASH is one of the leading causes of cirrhosis. About 10-20% of Americans have fatty liver and a smaller 2-5% has NASH. Document Released: 06/16/2005 Document Revised: 01/11/2011 Document Reviewed: 08/09/2005 [facility] Patient Information 2012 [place], [place].

## 2011-04-26 NOTE — Assessment & Plan Note (Signed)
Numbers have worsened. He has been on the Crestor and Trilipix a while, will add Welchol, he is given samples of both the capsules and the powder to see which one he likes better and he will notify us which he would prefer a prescription for. Avoid trans fats and minimize saturated fats

## 2011-04-26 NOTE — Progress Notes (Signed)
David Huynh [id number] Apr 22, 1941 04/26/2011      Progress Note-Follow Up  Subjective  Chief Complaint  Chief Complaint  Patient presents with  . Follow-up    7 month follow up    HPI  Patient is a 70 year old Caucasian male who is in today for followup of multiple medical problems. He reports he feels well. He's had no recent illness, fevers or breathing difficulties. He denies chest pain, palpitations, reflux, shortness of breath, GI or GU complaints. He does acknowledge she's been doing that job of following his diet recently eating more saturated fat and carbs. He is doing some breast-feeding taste testing for his daughter who writes about food. He has been tolerating his increased Metformin dosin  Past Medical History  Diagnosis Date  . Hyperlipidemia   . Obesity   . Allergy     seasonal  . Asthma     mild, intermittent  . Diabetes mellitus     type 2  . Hypertension   . Liver function study, abnormal 04/26/2011    Past Surgical History  Procedure Date  . Appendectomy 1975  . Tonsillectomy and adenoidectomy 1947  . Cataract extraction 2010    b/l  . Retinal laser procedure 1985    History reviewed. No pertinent family history.  History   Social History  . Marital Status: Married    Spouse Name: N/A    Number of Children: N/A  . Years of Education: N/A   Occupational History  . Not on file.   Social History Main Topics  . Smoking status: Never Smoker   . Smokeless tobacco: Never Used  . Alcohol Use: Yes     wine on weekends w/dinner  . Drug Use: No  . Sexually Active: Yes -- Male partner(s)   Other Topics Concern  . Not on file   Social History Narrative  . No narrative on file    Current Outpatient Prescriptions on File Prior to Visit  Medication Sig Dispense Refill  . albuterol (VENTOLIN HFA) 108 (90 BASE) MCG/ACT inhaler Inhale 2 puffs into the lungs as needed.  1 Inhaler  3  . amLODipine (NORVASC) 5 MG tablet Take 1 tablet (5 mg  total) by mouth daily.  30 tablet  0  . Choline Fenofibrate (TRILIPIX) 135 MG capsule Take 1 capsule (135 mg total) by mouth daily.  30 capsule  3  . lisinopril-hydrochlorothiazide (PRINZIDE,ZESTORETIC) 20-12.5 MG per tablet Take 2 tablets by mouth daily.  60 tablet  3  . metFORMIN (GLUCOPHAGE) 1000 MG tablet Take 1 tablet (1,000 mg total) by mouth 3 (three) times daily with meals.  90 tablet  3  . rosuvastatin (CRESTOR) 40 MG tablet Take 1 tablet (40 mg total) by mouth daily.  30 tablet  3    No Known Allergies  Review of Systems  Review of Systems  Constitutional: Negative for fever and malaise/fatigue.  HENT: Negative for congestion.   Eyes: Negative for discharge.  Respiratory: Negative for shortness of breath.   Cardiovascular: Negative for chest pain, palpitations and leg swelling.  Gastrointestinal: Negative for nausea, abdominal pain and diarrhea.  Genitourinary: Negative for dysuria.  Musculoskeletal: Negative for falls.  Skin: Negative for rash.  Neurological: Negative for loss of consciousness and headaches.  Endo/Heme/Allergies: Negative for polydipsia.  Psychiatric/Behavioral: Negative for depression and suicidal ideas. The patient is not nervous/anxious and does not have insomnia.     Objective  BP 152/96  Pulse 100  Temp(Src) 98.4 F (36.9 C) (Oral)  Ht 6' (1.829 m)  Wt 288 lb (130.636 kg)  BMI 39.06 kg/m2  SpO2 98%  Physical Exam  Physical Exam  Constitutional: He is oriented to person, place, and time and well-developed, well-nourished, and in no distress. No distress.  HENT:  Head: Normocephalic and atraumatic.  Eyes: Conjunctivae are normal.  Neck: Neck supple. No thyromegaly present.  Cardiovascular: Normal rate and regular rhythm.   Murmur heard.      1/6 systolic murmur  Pulmonary/Chest: Effort normal and breath sounds normal. No respiratory distress.  Abdominal: He exhibits no distension and no mass. There is no tenderness.  Musculoskeletal: He  exhibits no edema.  Neurological: He is alert and oriented to person, place, and time.  Skin: Skin is warm.  Psychiatric: Memory, affect and judgment normal.    Lab Results  Component Value Date   TSH 0.61 04/17/2011   Lab Results  Component Value Date   WBC 7.4 04/17/2011   HGB 15.4 04/17/2011   HCT 45.3 04/17/2011   MCV 94.0 04/17/2011   PLT 137.0* 04/17/2011   Lab Results  Component Value Date   CREATININE 0.9 04/17/2011   BUN 14 04/17/2011   NA 134* 04/17/2011   K 3.7 04/17/2011   CL 93* 04/17/2011   CO2 28 04/17/2011   Lab Results  Component Value Date   ALT 130* 04/17/2011   AST 85* 04/17/2011   ALKPHOS 37* 04/17/2011   BILITOT 1.1 04/17/2011   Lab Results  Component Value Date   CHOL 299* 04/17/2011   Lab Results  Component Value Date   HDL 55.20 04/17/2011   No results found for this basename: LDLCALC   Lab Results  Component Value Date   TRIG 374.0* 04/17/2011   Lab Results  Component Value Date   CHOLHDL 5 04/17/2011     Assessment & Plan  Diabetes mellitus hgba1c has dropped a point since increasing his metformin, continue same dosing and minimize carbs, calories and monitor sugars  Hypertension Elevated bp today but he did not take his meds today. He reports at home seeing systolics in the 120 to 130 range and diastolics between 70 to 90. Minimize sodium and recheck bp at next visit or sooner if numbers remain elevated on meds  Hyperlipidemia Numbers have worsened. He has been on the Crestor and Trilipix a while, will add Welchol, he is given samples of both the capsules and the powder to see which one he likes better and he will notify us which he would prefer a prescription for. Avoid trans fats and minimize saturated fats  Liver function study, abnormal Trending up, minimize alcohol, fatty foods and simple carbs, likely related to fatty liver disease, patient given educational handout and we will recheck levels in 3 months

## 2011-06-08 ENCOUNTER — Other Ambulatory Visit: Payer: Self-pay

## 2011-06-08 MED ORDER — AMLODIPINE BESYLATE 5 MG PO TABS: 5.0000 mg | ORAL_TABLET | Freq: Every day | ORAL | Status: DC

## 2011-06-08 MED ORDER — METFORMIN HCL 1000 MG PO TABS: 1000.0000 mg | ORAL_TABLET | Freq: Three times a day (TID) | ORAL | Status: DC

## 2011-06-19 ENCOUNTER — Other Ambulatory Visit: Payer: Self-pay

## 2011-06-19 MED ORDER — LISINOPRIL-HYDROCHLOROTHIAZIDE 20-12.5 MG PO TABS: 2.0000 | ORAL_TABLET | Freq: Every day | ORAL | Status: DC

## 2011-09-11 ENCOUNTER — Other Ambulatory Visit: Payer: Self-pay

## 2011-09-11 MED ORDER — ALBUTEROL SULFATE HFA 108 (90 BASE) MCG/ACT IN AERS: 2.0000 | INHALATION_SPRAY | RESPIRATORY_TRACT | Status: DC | PRN

## 2012-09-27 ENCOUNTER — Encounter (INDEPENDENT_AMBULATORY_CARE_PROVIDER_SITE_OTHER): Payer: Self-pay | Admitting: Ophthalmology

## 2012-10-29 ENCOUNTER — Telehealth: Payer: Self-pay | Admitting: Family Medicine

## 2012-10-29 MED ORDER — ALBUTEROL SULFATE HFA 108 (90 BASE) MCG/ACT IN AERS: 2.0000 | INHALATION_SPRAY | RESPIRATORY_TRACT | Status: DC | PRN

## 2012-10-29 NOTE — Telephone Encounter (Signed)
VENTOLIN HFA CTR 200 PUFFS IN HALE 2 PUFFS BY MOUTH AS NEEDED

## 2012-10-29 NOTE — Telephone Encounter (Signed)
Rx request to pharmacy, 30-day supply only; **Office Visit Needed Prior to Future Refills**SLS

## 2015-01-23 ENCOUNTER — Emergency Department (HOSPITAL_BASED_OUTPATIENT_CLINIC_OR_DEPARTMENT_OTHER): Payer: Medicare Other

## 2015-01-23 ENCOUNTER — Encounter (HOSPITAL_BASED_OUTPATIENT_CLINIC_OR_DEPARTMENT_OTHER): Payer: Self-pay | Admitting: Emergency Medicine

## 2015-01-23 ENCOUNTER — Inpatient Hospital Stay (HOSPITAL_BASED_OUTPATIENT_CLINIC_OR_DEPARTMENT_OTHER)
Admission: EM | Admit: 2015-01-23 | Discharge: 2015-01-29 | DRG: 186 | Disposition: A | Discharge status: Home / Self Care | Payer: Medicare Other | Attending: Surgery | Admitting: Surgery

## 2015-01-23 DIAGNOSIS — S2231XA Fracture of one rib, right side, initial encounter for closed fracture: Secondary | ICD-10-CM

## 2015-01-23 DIAGNOSIS — R0602 Shortness of breath: Secondary | ICD-10-CM | POA: Diagnosis not present

## 2015-01-23 DIAGNOSIS — S2241XA Multiple fractures of ribs, right side, initial encounter for closed fracture: Secondary | ICD-10-CM | POA: Diagnosis present

## 2015-01-23 DIAGNOSIS — Z9181 History of falling: Secondary | ICD-10-CM

## 2015-01-23 DIAGNOSIS — J942 Hemothorax: Secondary | ICD-10-CM | POA: Diagnosis not present

## 2015-01-23 DIAGNOSIS — I714 Abdominal aortic aneurysm, without rupture: Secondary | ICD-10-CM | POA: Diagnosis not present

## 2015-01-23 DIAGNOSIS — E785 Hyperlipidemia, unspecified: Secondary | ICD-10-CM | POA: Diagnosis not present

## 2015-01-23 DIAGNOSIS — S271XXA Traumatic hemothorax, initial encounter: Secondary | ICD-10-CM | POA: Diagnosis present

## 2015-01-23 DIAGNOSIS — J9811 Atelectasis: Secondary | ICD-10-CM | POA: Diagnosis present

## 2015-01-23 DIAGNOSIS — R339 Retention of urine, unspecified: Secondary | ICD-10-CM | POA: Diagnosis not present

## 2015-01-23 DIAGNOSIS — Z9889 Other specified postprocedural states: Secondary | ICD-10-CM

## 2015-01-23 DIAGNOSIS — J939 Pneumothorax, unspecified: Secondary | ICD-10-CM | POA: Diagnosis not present

## 2015-01-23 DIAGNOSIS — E669 Obesity, unspecified: Secondary | ICD-10-CM | POA: Diagnosis present

## 2015-01-23 DIAGNOSIS — I700 Atherosclerosis of aorta: Secondary | ICD-10-CM | POA: Diagnosis present

## 2015-01-23 DIAGNOSIS — E1165 Type 2 diabetes mellitus with hyperglycemia: Secondary | ICD-10-CM | POA: Diagnosis not present

## 2015-01-23 DIAGNOSIS — Z4682 Encounter for fitting and adjustment of non-vascular catheter: Secondary | ICD-10-CM | POA: Diagnosis not present

## 2015-01-23 DIAGNOSIS — J45909 Unspecified asthma, uncomplicated: Secondary | ICD-10-CM | POA: Diagnosis present

## 2015-01-23 DIAGNOSIS — Z6833 Body mass index (BMI) 33.0-33.9, adult: Secondary | ICD-10-CM | POA: Diagnosis not present

## 2015-01-23 DIAGNOSIS — N401 Enlarged prostate with lower urinary tract symptoms: Secondary | ICD-10-CM | POA: Diagnosis present

## 2015-01-23 DIAGNOSIS — R338 Other retention of urine: Secondary | ICD-10-CM | POA: Diagnosis not present

## 2015-01-23 DIAGNOSIS — J90 Pleural effusion, not elsewhere classified: Secondary | ICD-10-CM | POA: Diagnosis not present

## 2015-01-23 DIAGNOSIS — K5730 Diverticulosis of large intestine without perforation or abscess without bleeding: Secondary | ICD-10-CM | POA: Diagnosis present

## 2015-01-23 DIAGNOSIS — I10 Essential (primary) hypertension: Secondary | ICD-10-CM | POA: Diagnosis not present

## 2015-01-23 DIAGNOSIS — R000 Tachycardia, unspecified: Secondary | ICD-10-CM | POA: Diagnosis present

## 2015-01-23 DIAGNOSIS — E119 Type 2 diabetes mellitus without complications: Secondary | ICD-10-CM | POA: Diagnosis present

## 2015-01-23 DIAGNOSIS — Z23 Encounter for immunization: Secondary | ICD-10-CM

## 2015-01-23 DIAGNOSIS — W1830XA Fall on same level, unspecified, initial encounter: Secondary | ICD-10-CM | POA: Diagnosis present

## 2015-01-23 HISTORY — DX: Hemothorax: J94.2

## 2015-01-23 LAB — GLUCOSE, CAPILLARY: Glucose-Capillary: 207 mg/dL — ABNORMAL HIGH (ref 65–99)

## 2015-01-23 LAB — CBC WITH DIFFERENTIAL/PLATELET
Basophils Absolute: 0.1 10*3/uL (ref 0.0–0.1)
Basophils Relative: 1 % (ref 0–1)
EOS ABS: 0.7 10*3/uL (ref 0.0–0.7)
EOS PCT: 7 % — AB (ref 0–5)
HCT: 44 % (ref 39.0–52.0)
HEMOGLOBIN: 14.7 g/dL (ref 13.0–17.0)
LYMPHS ABS: 2.1 10*3/uL (ref 0.7–4.0)
Lymphocytes Relative: 21 % (ref 12–46)
MCH: 30.9 pg (ref 26.0–34.0)
MCHC: 33.4 g/dL (ref 30.0–36.0)
MCV: 92.4 fL (ref 78.0–100.0)
MONOS PCT: 10 % (ref 3–12)
Monocytes Absolute: 1 10*3/uL (ref 0.1–1.0)
Neutro Abs: 6.4 10*3/uL (ref 1.7–7.7)
Neutrophils Relative %: 63 % (ref 43–77)
PLATELETS: 332 10*3/uL (ref 150–400)
RBC: 4.76 MIL/uL (ref 4.22–5.81)
RDW: 11.7 % (ref 11.5–15.5)
WBC: 10.1 10*3/uL (ref 4.0–10.5)

## 2015-01-23 LAB — BASIC METABOLIC PANEL
Anion gap: 10 (ref 5–15)
BUN: 12 mg/dL (ref 6–20)
CHLORIDE: 100 mmol/L — AB (ref 101–111)
CO2: 28 mmol/L (ref 22–32)
CREATININE: 0.85 mg/dL (ref 0.61–1.24)
Calcium: 9.2 mg/dL (ref 8.9–10.3)
GFR calc Af Amer: >60 mL/min (ref >60)
GFR calc non Af Amer: >60 mL/min (ref >60)
Glucose, Bld: 248 mg/dL — ABNORMAL HIGH (ref 65–99)
Potassium: 3.8 mmol/L (ref 3.5–5.1)
SODIUM: 138 mmol/L (ref 135–145)

## 2015-01-23 LAB — TROPONIN I: TROPONIN I: 0.09 ng/mL — AB (ref <0.031)

## 2015-01-23 LAB — CBG MONITORING, ED: GLUCOSE-CAPILLARY: 210 mg/dL — AB (ref 65–99)

## 2015-01-23 LAB — BRAIN NATRIURETIC PEPTIDE: B Natriuretic Peptide: 134.3 pg/mL — ABNORMAL HIGH (ref 0.0–100.0)

## 2015-01-23 MED ORDER — ALBUTEROL SULFATE (2.5 MG/3ML) 0.083% IN NEBU
2.5000 mg | INHALATION_SOLUTION | Freq: Four times a day (QID) | RESPIRATORY_TRACT | Status: DC | PRN
  Administered 2015-01-23 – 2015-01-24 (×2): 2.5 mg via RESPIRATORY_TRACT
  Filled 2015-01-23 (×2): qty 3

## 2015-01-23 MED ORDER — INFLUENZA VAC SPLIT QUAD 0.5 ML IM SUSY
0.5000 mL | PREFILLED_SYRINGE | INTRAMUSCULAR | Status: DC
  Filled 2015-01-23: qty 0.5

## 2015-01-23 MED ORDER — SODIUM CHLORIDE 0.9 % IV SOLN
Freq: Once | INTRAVENOUS | Status: AC
  Administered 2015-01-23: 16:00:00 via INTRAVENOUS

## 2015-01-23 MED ORDER — LABETALOL HCL 5 MG/ML IV SOLN
INTRAVENOUS | Status: AC
  Administered 2015-01-23: 10 mg
  Filled 2015-01-23: qty 4

## 2015-01-23 MED ORDER — IOHEXOL 300 MG/ML  SOLN
25.0000 mL | Freq: Once | INTRAMUSCULAR | Status: AC | PRN
  Administered 2015-01-23: 25 mL via ORAL

## 2015-01-23 MED ORDER — LABETALOL HCL 5 MG/ML IV SOLN
5.0000 mg | INTRAVENOUS | Status: DC | PRN
Start: 2015-01-23 — End: 2015-01-24
  Administered 2015-01-24 (×3): 10 mg via INTRAVENOUS
  Filled 2015-01-23 (×3): qty 4

## 2015-01-23 MED ORDER — IOHEXOL 300 MG/ML  SOLN
100.0000 mL | Freq: Once | INTRAMUSCULAR | Status: AC | PRN
  Administered 2015-01-23: 100 mL via INTRAVENOUS

## 2015-01-23 MED ORDER — DEXTROSE 5 % IV SOLN
1.5000 g | INTRAVENOUS | Status: AC
  Administered 2015-01-24: 1.5 g via INTRAVENOUS
  Filled 2015-01-23 (×2): qty 1.5

## 2015-01-23 MED ORDER — LORAZEPAM 2 MG/ML IJ SOLN
0.5000 mg | Freq: Once | INTRAMUSCULAR | Status: AC
  Administered 2015-01-23: 0.5 mg via INTRAVENOUS
  Filled 2015-01-23: qty 1

## 2015-01-23 MED ORDER — INSULIN ASPART 100 UNIT/ML ~~LOC~~ SOLN
0.0000 [IU] | SUBCUTANEOUS | Status: DC
  Administered 2015-01-23: 2 [IU] via SUBCUTANEOUS
  Administered 2015-01-24: 3 [IU] via SUBCUTANEOUS

## 2015-01-23 MED ORDER — ALBUTEROL SULFATE (2.5 MG/3ML) 0.083% IN NEBU
INHALATION_SOLUTION | RESPIRATORY_TRACT | Status: AC
  Administered 2015-01-23: 2.5 mg via RESPIRATORY_TRACT
  Filled 2015-01-23: qty 3

## 2015-01-23 MED ORDER — ALBUTEROL SULFATE (2.5 MG/3ML) 0.083% IN NEBU
2.5000 mg | INHALATION_SOLUTION | Freq: Once | RESPIRATORY_TRACT | Status: AC
  Administered 2015-01-23: 2.5 mg via RESPIRATORY_TRACT

## 2015-01-23 MED ORDER — PNEUMOCOCCAL VAC POLYVALENT 25 MCG/0.5ML IJ INJ: 0.5000 mL | INJECTION | INTRAMUSCULAR | Status: DC

## 2015-01-23 MED ORDER — ALBUTEROL SULFATE HFA 108 (90 BASE) MCG/ACT IN AERS: 2.0000 | INHALATION_SPRAY | Freq: Four times a day (QID) | RESPIRATORY_TRACT | Status: DC | PRN

## 2015-01-23 NOTE — Consult Note (Signed)
[address]       [place] [address]             [id number]      Cardiothoracic Surgery Consultation  Reason for Consult: Large right pleural effusion with lung collapse Referring Physician: Dr. Jennette Kettle  David Huynh is an 74 y.o. male.  HPI:   The patient is a 74 year old gentleman with hyperlipidemia, hypertension, DM and asthma who fell on right side about 2 weeks ago and had severe right chest wall pain for about a week. He had some wheezing and shortness of breath improved with inhaler. Then about 5 days ago he began having more shortness of breath that has progressed to severe today. He presented to [facility] and CXR and CT chest shows right rib fractures and large right effusion/hemothorax with complete collapse of the lower and middle lobes and partial collapse of the upper lobe.  Past Medical History  Diagnosis Date  . Hyperlipidemia   . Obesity   . Allergy     seasonal  . Asthma     mild, intermittent  . Diabetes mellitus     type 2  . Hypertension   . Liver function study, abnormal 04/26/2011    Past Surgical History  Procedure Laterality Date  . Appendectomy  1975  . Tonsillectomy and adenoidectomy  1947  . Cataract extraction  2010    b/l  . Retinal laser procedure  1985    History reviewed. No pertinent family history.  Social History:  reports that he has never smoked. He has never used smokeless tobacco. He reports that he drinks alcohol. He reports that he does not use illicit drugs.  Allergies: No Known Allergies  Medications:  I have reviewed the patient's current medications. Prior to Admission:  Prescriptions prior to admission  Medication Sig Dispense Refill Last Dose  . acetaminophen (TYLENOL) 500 MG tablet Take 500 mg by mouth 3 (three) times daily as needed (pain).   3 days ago  . albuterol (VENTOLIN HFA) 108 (90 BASE) MCG/ACT inhaler Inhale 2 puffs into the lungs as needed. (Patient taking  differently: Inhale 2 puffs into the lungs every 6 (six) hours as needed for wheezing or shortness of breath. ) 1 Inhaler 0 01/23/2015 at noon  . cetirizine (ZYRTEC) 10 MG tablet Take 10 mg by mouth at bedtime.    01/22/2015 at Unknown time  . CINNAMON PO Take 1 capsule by mouth at bedtime.    2 days ago  . Multiple Vitamin (MULTIVITAMIN WITH MINERALS) TABS tablet Take 1 tablet by mouth at bedtime.    2 days ago  . TURMERIC PO Take 1 capsule by mouth at bedtime.    2 days ago  . colesevelam (WELCHOL) 625 MG tablet Take 3 tablets (1,875 mg total) by mouth 2 (two) times daily with a meal. 72 tablet 0   . [DISCONTINUED] amLODipine (NORVASC) 5 MG tablet Take 1 tablet (5 mg total) by mouth daily. (Patient not taking: Reported on 01/23/2015) 90 tablet 1 Not Taking at Unknown time  . [DISCONTINUED] Choline Fenofibrate (TRILIPIX) 135 MG capsule Take 1 capsule (135 mg total) by mouth daily. (Patient not taking: Reported on 01/23/2015) 30 capsule 3 Not Taking at Unknown time  . [DISCONTINUED] lisinopril-hydrochlorothiazide (PRINZIDE,ZESTORETIC) 20-12.5 MG per tablet Take 2 tablets by mouth daily. (Patient not taking: Reported on 01/23/2015) 60 tablet 3 Not Taking at Unknown time  . [DISCONTINUED] metFORMIN (GLUCOPHAGE) 1000  MG tablet Take 1 tablet (1,000 mg total) by mouth 3 (three) times daily with meals. (Patient not taking: Reported on 01/23/2015) 270 tablet 1 Not Taking at Unknown time  . [DISCONTINUED] rosuvastatin (CRESTOR) 40 MG tablet Take 1 tablet (40 mg total) by mouth daily. (Patient not taking: Reported on 01/23/2015) 30 tablet 3 Not Taking at Unknown time   Scheduled: . [START ON 01/24/2015] cefUROXime (ZINACEF)  IV  1.5 g Intravenous On Call to OR  . [START ON 01/24/2015] Influenza vac split quadrivalent PF  0.5 mL Intramuscular Tomorrow-1000  . insulin aspart  0-9 Units Subcutaneous 6 times per day  . [START ON 01/24/2015] pneumococcal 23 valent vaccine  0.5 mL Intramuscular Tomorrow-1000   Continuous:    PXT:GGYIRSWNI, labetalol Anti-infectives    Start     Dose/Rate Route Frequency Ordered Stop   01/24/15 0600  cefUROXime (ZINACEF) 1.5 g in dextrose 5 % 50 mL IVPB     1.5 g 100 mL/hr over 30 Minutes Intravenous On call to O.R. 01/23/15 2316 01/25/15 0559      Results for orders placed or performed during the hospital encounter of 01/23/15 (from the past 48 hour(s))  CBC with Differential/Platelet     Status: Abnormal   Collection Time: 01/23/15  3:15 PM  Result Value Ref Range   WBC 10.1 4.0 - 10.5 K/uL   RBC 4.76 4.22 - 5.81 MIL/uL   Hemoglobin 14.7 13.0 - 17.0 g/dL   HCT 44.0 39.0 - 52.0 %   MCV 92.4 78.0 - 100.0 fL   MCH 30.9 26.0 - 34.0 pg   MCHC 33.4 30.0 - 36.0 g/dL   RDW 11.7 11.5 - 15.5 %   Platelets 332 150 - 400 K/uL   Neutrophils Relative % 63 43 - 77 %   Neutro Abs 6.4 1.7 - 7.7 K/uL   Lymphocytes Relative 21 12 - 46 %   Lymphs Abs 2.1 0.7 - 4.0 K/uL   Monocytes Relative 10 3 - 12 %   Monocytes Absolute 1.0 0.1 - 1.0 K/uL   Eosinophils Relative 7 (H) 0 - 5 %   Eosinophils Absolute 0.7 0.0 - 0.7 K/uL   Basophils Relative 1 0 - 1 %   Basophils Absolute 0.1 0.0 - 0.1 K/uL  Basic metabolic panel     Status: Abnormal   Collection Time: 01/23/15  3:15 PM  Result Value Ref Range   Sodium 138 135 - 145 mmol/L   Potassium 3.8 3.5 - 5.1 mmol/L   Chloride 100 (L) 101 - 111 mmol/L   CO2 28 22 - 32 mmol/L   Glucose, Bld 248 (H) 65 - 99 mg/dL   BUN 12 6 - 20 mg/dL   Creatinine, Ser 0.85 0.61 - 1.24 mg/dL   Calcium 9.2 8.9 - 10.3 mg/dL   GFR calc non Af Amer >60 >60 mL/min   GFR calc Af Amer >60 >60 mL/min    Comment: (NOTE) The eGFR has been calculated using the CKD EPI equation. This calculation has not been validated in all clinical situations. eGFR's persistently <60 mL/min signify possible Chronic Kidney Disease.    Anion gap 10 5 - 15  Troponin I     Status: Abnormal   Collection Time: 01/23/15  3:15 PM  Result Value Ref Range   Troponin I 0.09 (H) <0.031  ng/mL    Comment:        PERSISTENTLY INCREASED TROPONIN VALUES IN THE RANGE OF 0.04-0.49 ng/mL CAN BE SEEN IN:       -  UNSTABLE ANGINA       -CONGESTIVE HEART FAILURE       -MYOCARDITIS       -CHEST TRAUMA       -ARRYHTHMIAS       -LATE PRESENTING MYOCARDIAL INFARCTION       -COPD   CLINICAL FOLLOW-UP RECOMMENDED.   Brain natriuretic peptide     Status: Abnormal   Collection Time: 01/23/15  3:15 PM  Result Value Ref Range   B Natriuretic Peptide 134.3 (H) 0.0 - 100.0 pg/mL  CBG monitoring, ED     Status: Abnormal   Collection Time: 01/23/15  5:14 PM  Result Value Ref Range   Glucose-Capillary 210 (H) 65 - 99 mg/dL  Glucose, capillary     Status: Abnormal   Collection Time: 01/23/15  9:58 PM  Result Value Ref Range   Glucose-Capillary 207 (H) 65 - 99 mg/dL    Dg Chest 2 View  01/23/2015   CLINICAL DATA:  Shortness of breath for 1 week. History of asthma and diabetes. No reported history of trauma.  EXAM: CHEST  2 VIEW  COMPARISON:  10/01/2007.  FINDINGS: Dense opacity in the RIGHT mid to lower lung zone associated with a fracture of the RIGHT eighth rib laterally. Suspected hemothorax. No visible pneumothorax. Compressive atelectasis at the RIGHT base with or without consolidation. Normal LEFT lung. Cardiomediastinal silhouette may be slightly displaced RIGHT to LEFT. Thoracic atherosclerosis.  The central RIGHT hilar structures are poorly visualized, as is the RIGHT mainstem bronchus, and a central obstructing lesion as a cause for the RIGHT lung opacity is not excluded although is less likely. Consider CT chest with contrast for further evaluation.  IMPRESSION: Acute RIGHT lateral eighth rib fracture, although no reported history of trauma.  Significant Opacity of the RIGHT hemithorax, query hemothorax and compressive atelectasis. See discussion above.  Recommend CT chest with contrast for further evaluation.   Electronically Signed   By: Staci Righter M.D.   On: 01/23/2015 15:06   Ct  Chest W Contrast  01/23/2015   CLINICAL DATA:  Shortness of breath.  Right rib fracture.  EXAM: CT CHEST, ABDOMEN, AND PELVIS WITH CONTRAST  TECHNIQUE: Multidetector CT imaging of the chest, abdomen and pelvis was performed following the standard protocol during bolus administration of intravenous contrast.  CONTRAST:  147mL OMNIPAQUE IOHEXOL 300 MG/ML SOLN, 25mL OMNIPAQUE IOHEXOL 300 MG/ML SOLN  COMPARISON:  Radiograph of same day.  FINDINGS: CT CHEST FINDINGS  Acute mildly displaced fractures are seen involving the lateral portions of the right sixth, seventh and eighth ribs. No pneumothorax is noted. Large right pleural effusion is noted with complete atelectasis of the right lower lobe and right middle lobe. Left lung is clear. This results in mild mediastinal shift to the left. There is no evidence of thoracic aortic dissection or aneurysm. Mildly enlarged lymph nodes are noted in the mediastinum which most likely are reactive or inflammatory in origin as none appear to be greater than 1 cm in size.  CT ABDOMEN AND PELVIS FINDINGS  No gallstones are noted. The liver, spleen and pancreas appear normal. Adrenal glands appear normal. No hydronephrosis or renal obstruction is noted. No renal or ureteral calculi are noted. 4.3 cm exophytic cyst arises posteriorly from the upper pole of left kidney. Right kidney appears normal. Sigmoid diverticulosis is noted without inflammation. Urinary bladder appears normal. No significant adenopathy is noted. Atherosclerosis of abdominal aorta is noted without aneurysm formation.  IMPRESSION: Mildly displaced fractures are seen involving the  lateral portions of the right sixth, seventh and eighth ribs. Large right pleural effusion is noted with complete atelectasis of the right middle and lower lobes. This results in mild mediastinal shift to the left.  Atherosclerosis of abdominal aorta is noted without aneurysm formation.  Sigmoid diverticulosis is noted without inflammation.   No acute abnormality is noted in the abdomen or pelvis.   Electronically Signed   By: Marijo Conception, M.D.   On: 01/23/2015 17:20   Ct Abdomen Pelvis W Contrast  01/23/2015   CLINICAL DATA:  Shortness of breath.  Right rib fracture.  EXAM: CT CHEST, ABDOMEN, AND PELVIS WITH CONTRAST  TECHNIQUE: Multidetector CT imaging of the chest, abdomen and pelvis was performed following the standard protocol during bolus administration of intravenous contrast.  CONTRAST:  161mL OMNIPAQUE IOHEXOL 300 MG/ML SOLN, 61mL OMNIPAQUE IOHEXOL 300 MG/ML SOLN  COMPARISON:  Radiograph of same day.  FINDINGS: CT CHEST FINDINGS  Acute mildly displaced fractures are seen involving the lateral portions of the right sixth, seventh and eighth ribs. No pneumothorax is noted. Large right pleural effusion is noted with complete atelectasis of the right lower lobe and right middle lobe. Left lung is clear. This results in mild mediastinal shift to the left. There is no evidence of thoracic aortic dissection or aneurysm. Mildly enlarged lymph nodes are noted in the mediastinum which most likely are reactive or inflammatory in origin as none appear to be greater than 1 cm in size.  CT ABDOMEN AND PELVIS FINDINGS  No gallstones are noted. The liver, spleen and pancreas appear normal. Adrenal glands appear normal. No hydronephrosis or renal obstruction is noted. No renal or ureteral calculi are noted. 4.3 cm exophytic cyst arises posteriorly from the upper pole of left kidney. Right kidney appears normal. Sigmoid diverticulosis is noted without inflammation. Urinary bladder appears normal. No significant adenopathy is noted. Atherosclerosis of abdominal aorta is noted without aneurysm formation.  IMPRESSION: Mildly displaced fractures are seen involving the lateral portions of the right sixth, seventh and eighth ribs. Large right pleural effusion is noted with complete atelectasis of the right middle and lower lobes. This results in mild  mediastinal shift to the left.  Atherosclerosis of abdominal aorta is noted without aneurysm formation.  Sigmoid diverticulosis is noted without inflammation.  No acute abnormality is noted in the abdomen or pelvis.   Electronically Signed   By: Marijo Conception, M.D.   On: 01/23/2015 17:20    Review of Systems  Constitutional: Negative for fever, chills, weight loss and malaise/fatigue.  HENT: Negative.   Eyes: Negative.   Respiratory: Positive for cough and shortness of breath. Negative for hemoptysis and sputum production.   Cardiovascular: Positive for leg swelling. Negative for chest pain, orthopnea and PND.  Gastrointestinal: Negative.   Genitourinary: Negative.   Musculoskeletal:       Right chest wall pain  Skin: Negative.   Neurological: Negative.   Endo/Heme/Allergies: Negative.   Psychiatric/Behavioral: Negative.    Blood pressure 165/78, pulse 102, temperature 98.2 F (36.8 C), temperature source Oral, resp. rate 36, height 6' (1.829 m), weight 123.7 kg (272 lb 11.3 oz), SpO2 96 %. Physical Exam  Constitutional: He is oriented to person, place, and time. He appears well-developed and well-nourished. No distress.  HENT:  Head: Normocephalic and atraumatic.  Eyes: EOM are normal. Pupils are equal, round, and reactive to light.  Neck: Normal range of motion. Neck supple. No JVD present.  Cardiovascular: Normal rate, regular rhythm, normal heart  sounds and intact distal pulses.   No murmur heard. Respiratory: Effort normal. He exhibits tenderness.  Marked decrease in breath sounds on the right  GI: Soft. Bowel sounds are normal. He exhibits no distension and no mass. There is no tenderness.  Musculoskeletal: Normal range of motion. He exhibits edema.  Lymphadenopathy:    He has no cervical adenopathy.  Neurological: He is alert and oriented to person, place, and time.  Skin: Skin is warm and dry.  Psychiatric: He has a normal mood and affect.   CLINICAL DATA: Shortness  of breath. Right rib fracture.  EXAM: CT CHEST, ABDOMEN, AND PELVIS WITH CONTRAST  TECHNIQUE: Multidetector CT imaging of the chest, abdomen and pelvis was performed following the standard protocol during bolus administration of intravenous contrast.  CONTRAST: 133mL OMNIPAQUE IOHEXOL 300 MG/ML SOLN, 55mL OMNIPAQUE IOHEXOL 300 MG/ML SOLN  COMPARISON: Radiograph of same day.  FINDINGS: CT CHEST FINDINGS  Acute mildly displaced fractures are seen involving the lateral portions of the right sixth, seventh and eighth ribs. No pneumothorax is noted. Large right pleural effusion is noted with complete atelectasis of the right lower lobe and right middle lobe. Left lung is clear. This results in mild mediastinal shift to the left. There is no evidence of thoracic aortic dissection or aneurysm. Mildly enlarged lymph nodes are noted in the mediastinum which most likely are reactive or inflammatory in origin as none appear to be greater than 1 cm in size.  CT ABDOMEN AND PELVIS FINDINGS  No gallstones are noted. The liver, spleen and pancreas appear normal. Adrenal glands appear normal. No hydronephrosis or renal obstruction is noted. No renal or ureteral calculi are noted. 4.3 cm exophytic cyst arises posteriorly from the upper pole of left kidney. Right kidney appears normal. Sigmoid diverticulosis is noted without inflammation. Urinary bladder appears normal. No significant adenopathy is noted. Atherosclerosis of abdominal aorta is noted without aneurysm formation.  IMPRESSION: Mildly displaced fractures are seen involving the lateral portions of the right sixth, seventh and eighth ribs. Large right pleural effusion is noted with complete atelectasis of the right middle and lower lobes. This results in mild mediastinal shift to the left.  Atherosclerosis of abdominal aorta is noted without aneurysm formation.  Sigmoid diverticulosis is noted without  inflammation.  No acute abnormality is noted in the abdomen or pelvis.   Electronically Signed  By: Marijo Conception, M.D.  On: 01/23/2015 17:20   Assessment/Plan:  He has a large right pleural effusion with near complete collapse of the right lung and progressive shortness of breath. He fell and suffered 3 right rib fractures 2 weeks ago and probably had some bleeding in the pleural space followed by progressive effusion. I think the best treatment is right VATS for complete drainage and reexpansion of the lung. I discussed the operative procedure with the patient including alternatives, benefits and risks; including but not limited to bleeding, blood transfusion, infection, recurrent effusion and failure of the lung to completely expand.   David Huynh understands and agrees to proceed.  We will schedule surgery for the morning.  Gaye Pollack 01/23/2015, 11:16 PM

## 2015-01-23 NOTE — ED Notes (Signed)
Call wife when [place] is being transferred [phone] Home [phone] Cell

## 2015-01-23 NOTE — ED Provider Notes (Signed)
CSN: [id number]     Arrival date & time 01/23/15  1415 History  This chart was scribed for David Octave, MD by [name], ED Scribe. This patient was seen in room MH10/MH10 and the patient's care was started 3:08 PM.   Chief Complaint  Patient presents with  . Shortness of Breath   The history is provided by the patient. No language interpreter was used.   HPI Comments: David Huynh is a 74 y.o. male, with a PMhx of asthma, DM, and HTN, who presents to the Emergency Department complaining of gradually worsening, constant SOB onset 5 days ago. Pt reports associated wheezing and BLE edema. He states his SOB is exacerbated with exertion. The pt has asthma and notes it is exacerbated with seasonal allergies. He has used his albuterol inhaler at onset of worsening SOB with no relief of symptoms. Per wife, the pt fell on his right side 2 weeks ago but he denies current pain attributable to the fall. Pt not taking blood thinning medication.The pt has not been hospitalized in the past for asthma exacerbation. He has a history of DM which he is not currently taking medication for. He denies a productive cough, CP, fevers, back pain, abdominal pain, or a history of cardiomyopathies. The pt is not a current smoker. NKDA  Past Medical History  Diagnosis Date  . Hyperlipidemia   . Obesity   . Allergy     seasonal  . Asthma     mild, intermittent  . Diabetes mellitus     type 2  . Hypertension   . Liver function study, abnormal 04/26/2011   Past Surgical History  Procedure Laterality Date  . Appendectomy  1975  . Tonsillectomy and adenoidectomy  1947  . Cataract extraction  2010    b/l  . Retinal laser procedure  1985   No family history on file. Social History  Substance Use Topics  . Smoking status: Never Smoker   . Smokeless tobacco: Never Used  . Alcohol Use: Yes     Comment: wine on weekends w/dinner    Review of Systems  Constitutional: Negative for fever.   Respiratory: Positive for shortness of breath and wheezing. Negative for cough.   Cardiovascular: Positive for leg swelling. Negative for chest pain.  Gastrointestinal: Negative for abdominal pain.  Musculoskeletal: Negative for back pain.  Hematological: Does not bruise/bleed easily.  A complete 10 system review of systems was obtained and is otherwise negative except at noted in the HPI and PMH.  Allergies  Review of patient's allergies indicates no known allergies.  Home Medications   Prior to Admission medications   Medication Sig Start Date End Date Taking? Authorizing Provider  cetirizine (ZYRTEC) 10 MG tablet Take 10 mg by mouth daily.   Yes Historical Provider, MD  albuterol (VENTOLIN HFA) 108 (90 BASE) MCG/ACT inhaler Inhale 2 puffs into the lungs as needed. 10/29/12   Bradd Canary, MD  amLODipine (NORVASC) 5 MG tablet Take 1 tablet (5 mg total) by mouth daily. 06/08/11   Bradd Canary, MD  Choline Fenofibrate (TRILIPIX) 135 MG capsule Take 1 capsule (135 mg total) by mouth daily. 08/05/10   Bradd Canary, MD  colesevelam [facility]) 625 MG tablet Take 3 tablets (1,875 mg total) by mouth 2 (two) times daily with a meal. 04/26/11 04/25/12  Bradd Canary, MD  lisinopril-hydrochlorothiazide (PRINZIDE,ZESTORETIC) 20-12.5 MG per tablet Take 2 tablets by mouth daily. 06/19/11   Bradd Canary, MD  metFORMIN (GLUCOPHAGE)  1000 MG tablet Take 1 tablet (1,000 mg total) by mouth 3 (three) times daily with meals. 06/08/11   Bradd Canary, MD  rosuvastatin (CRESTOR) 40 MG tablet Take 1 tablet (40 mg total) by mouth daily. 08/05/10   Bradd Canary, MD   BP 167/80 mmHg  Pulse 106  Temp(Src) 97.6 F (36.4 C) (Oral)  Ht 6' (1.829 m)  Wt 245 lb (111.131 kg)  BMI 33.22 kg/m2  SpO2 96% Physical Exam  Constitutional: He is oriented to person, place, and time. He appears well-developed and well-nourished. No distress.  HENT:  Head: Normocephalic and atraumatic.  Mouth/Throat: Oropharynx is  clear and moist. No oropharyngeal exudate.  Eyes: Conjunctivae and EOM are normal. Pupils are equal, round, and reactive to light.  Neck: Normal range of motion. Neck supple.  No meningismus.  Cardiovascular: Normal rate, regular rhythm, normal heart sounds and intact distal pulses.   No murmur heard. Pulmonary/Chest: Effort normal and breath sounds normal. No respiratory distress.  Increased work of breathing; dyspneic with conversation; decreased breath sounds right, inferior lung field; 2+ pedal edema.  Abdominal: Soft. There is no tenderness. There is no rebound and no guarding.  Musculoskeletal: Normal range of motion. He exhibits no edema or tenderness.  Neurological: He is alert and oriented to person, place, and time. No cranial nerve deficit. He exhibits normal muscle tone. Coordination normal.  No ataxia on finger to nose bilaterally. No pronator drift. 5/5 strength throughout. CN 2-12 intact. Negative Romberg. Equal grip strength. Sensation intact. Gait is normal.   Skin: Skin is warm.  Psychiatric: He has a normal mood and affect. His behavior is normal.  Nursing note and vitals reviewed.   ED Course  Procedures  DIAGNOSTIC STUDIES: Oxygen Saturation is 96% on RA, adequate by my interpretation.    COORDINATION OF CARE: 3:12 PM Discussed treatment plan with pt. Discussed chest Xray results with pt. Pt acknowledges and agrees to plan.   Labs Review Labs Reviewed  CBC WITH DIFFERENTIAL/PLATELET - Abnormal; Notable for the following:    Eosinophils Relative 7 (*)    All other components within normal limits  BASIC METABOLIC PANEL - Abnormal; Notable for the following:    Chloride 100 (*)    Glucose, Bld 248 (*)    All other components within normal limits  TROPONIN I - Abnormal; Notable for the following:    Troponin I 0.09 (*)    All other components within normal limits  BRAIN NATRIURETIC PEPTIDE - Abnormal; Notable for the following:    B Natriuretic Peptide 134.3 (*)     All other components within normal limits  CBG MONITORING, ED - Abnormal; Notable for the following:    Glucose-Capillary 210 (*)    All other components within normal limits    Imaging Review Dg Chest 2 View  01/23/2015   CLINICAL DATA:  Shortness of breath for 1 week. History of asthma and diabetes. No reported history of trauma.  EXAM: CHEST  2 VIEW  COMPARISON:  10/01/2007.  FINDINGS: Dense opacity in the RIGHT mid to lower lung zone associated with a fracture of the RIGHT eighth rib laterally. Suspected hemothorax. No visible pneumothorax. Compressive atelectasis at the RIGHT base with or without consolidation. Normal LEFT lung. Cardiomediastinal silhouette may be slightly displaced RIGHT to LEFT. Thoracic atherosclerosis.  The central RIGHT hilar structures are poorly visualized, as is the RIGHT mainstem bronchus, and a central obstructing lesion as a cause for the RIGHT lung opacity is not excluded although  is less likely. Consider CT chest with contrast for further evaluation.  IMPRESSION: Acute RIGHT lateral eighth rib fracture, although no reported history of trauma.  Significant Opacity of the RIGHT hemithorax, query hemothorax and compressive atelectasis. See discussion above.  Recommend CT chest with contrast for further evaluation.   Electronically Signed   By: Elsie Stain M.D.   On: 01/23/2015 15:06   Ct Chest W Contrast  01/23/2015   CLINICAL DATA:  Shortness of breath.  Right rib fracture.  EXAM: CT CHEST, ABDOMEN, AND PELVIS WITH CONTRAST  TECHNIQUE: Multidetector CT imaging of the chest, abdomen and pelvis was performed following the standard protocol during bolus administration of intravenous contrast.  CONTRAST:  OMNIPAQUE IOHEXOL 300 MG/ML SOLN, 25mL OMNIPAQUE IOHEXOL 300 MG/ML SOLN  COMPARISON:  Radiograph of same day.  FINDINGS: CT CHEST FINDINGS  Acute mildly displaced fractures are seen involving the lateral portions of the right sixth, seventh and eighth ribs. No  pneumothorax is noted. Large right pleural effusion is noted with complete atelectasis of the right lower lobe and right middle lobe. Left lung is clear. This results in mild mediastinal shift to the left. There is no evidence of thoracic aortic dissection or aneurysm. Mildly enlarged lymph nodes are noted in the mediastinum which most likely are reactive or inflammatory in origin as none appear to be greater than 1 cm in size.  CT ABDOMEN AND PELVIS FINDINGS  No gallstones are noted. The liver, spleen and pancreas appear normal. Adrenal glands appear normal. No hydronephrosis or renal obstruction is noted. No renal or ureteral calculi are noted. 4.3 cm exophytic cyst arises posteriorly from the upper pole of left kidney. Right kidney appears normal. Sigmoid diverticulosis is noted without inflammation. Urinary bladder appears normal. No significant adenopathy is noted. Atherosclerosis of abdominal aorta is noted without aneurysm formation.  IMPRESSION: Mildly displaced fractures are seen involving the lateral portions of the right sixth, seventh and eighth ribs. Large right pleural effusion is noted with complete atelectasis of the right middle and lower lobes. This results in mild mediastinal shift to the left.  Atherosclerosis of abdominal aorta is noted without aneurysm formation.  Sigmoid diverticulosis is noted without inflammation.  No acute abnormality is noted in the abdomen or pelvis.   Electronically Signed   By: Lupita Raider, M.D.   On: 01/23/2015 17:20   Ct Abdomen Pelvis W Contrast  01/23/2015   CLINICAL DATA:  Shortness of breath.  Right rib fracture.  EXAM: CT CHEST, ABDOMEN, AND PELVIS WITH CONTRAST  TECHNIQUE: Multidetector CT imaging of the chest, abdomen and pelvis was performed following the standard protocol during bolus administration of intravenous contrast.  CONTRAST:  OMNIPAQUE IOHEXOL 300 MG/ML SOLN, 25mL OMNIPAQUE IOHEXOL 300 MG/ML SOLN  COMPARISON:  Radiograph of same day.   FINDINGS: CT CHEST FINDINGS  Acute mildly displaced fractures are seen involving the lateral portions of the right sixth, seventh and eighth ribs. No pneumothorax is noted. Large right pleural effusion is noted with complete atelectasis of the right lower lobe and right middle lobe. Left lung is clear. This results in mild mediastinal shift to the left. There is no evidence of thoracic aortic dissection or aneurysm. Mildly enlarged lymph nodes are noted in the mediastinum which most likely are reactive or inflammatory in origin as none appear to be greater than 1 cm in size.  CT ABDOMEN AND PELVIS FINDINGS  No gallstones are noted. The liver, spleen and pancreas appear normal. Adrenal glands appear normal.  No hydronephrosis or renal obstruction is noted. No renal or ureteral calculi are noted. 4.3 cm exophytic cyst arises posteriorly from the upper pole of left kidney. Right kidney appears normal. Sigmoid diverticulosis is noted without inflammation. Urinary bladder appears normal. No significant adenopathy is noted. Atherosclerosis of abdominal aorta is noted without aneurysm formation.  IMPRESSION: Mildly displaced fractures are seen involving the lateral portions of the right sixth, seventh and eighth ribs. Large right pleural effusion is noted with complete atelectasis of the right middle and lower lobes. This results in mild mediastinal shift to the left.  Atherosclerosis of abdominal aorta is noted without aneurysm formation.  Sigmoid diverticulosis is noted without inflammation.  No acute abnormality is noted in the abdomen or pelvis.   Electronically Signed   By: Lupita Raider, M.D.   On: 01/23/2015 17:20   I have personally reviewed and evaluated these images and lab results as part of my medical decision-making.   EKG Interpretation   Date/Time:  Saturday January 23 2015 15:19:18 EDT Ventricular Rate:  110 PR Interval:  152 QRS Duration: 94 QT Interval:  350 QTC Calculation: 473 R Axis:    81 Text Interpretation:  Sinus tachycardia Otherwise normal ECG Rate faster  Confirmed by Fields Oros  MD, Madysin Crisp ([id number]) on 01/23/2015 3:21:34 PM      MDM   Final diagnoses:  Pleural effusion  Rib fracture, right, closed, initial encounter   5 days of worsening shortness of breath unrelieved by albuterol at home. No chest pain or fever. Nonproductive cough. Endorses fall 2 weeks ago onto right side. No blood thinner use.  Increased work of breathing, dyspnea with conversation, no hypoxia. Mild tachycardia. Decreased breath sounds on the right.  Chest x-ray shows a eighth rib fracture with suspected hemothorax.  CXR d/w Dr. Dwain Sarna.  He does not recommend chest tube as this fluid collection is likely 98 weeks old and likely clotted blood. Recommend CT scan, medical admission and thoracic surgery consult as needed.  CT with multiple rib fractures and large effusion/hemothorax with lung collapse.  Admission d.w Dr. Jerral Ralph.  Message left with Dr. Sharee Pimple nurse as he is in OR. Patient remains dyspneic but stable on Melvin without hypoxia and appears stable for transfer to [facility].   CRITICAL CARE Performed by: David Huynh Total critical care time: 45 Critical care time was exclusive of separately billable procedures and treating other patients. Critical care was necessary to treat or prevent imminent or life-threatening deterioration. Critical care was time spent personally by me on the following activities: development of treatment plan with patient and/or surrogate as well as nursing, discussions with consultants, evaluation of patient's response to treatment, examination of patient, obtaining history from patient or surrogate, ordering and performing treatments and interventions, ordering and review of laboratory studies, ordering and review of radiographic studies, pulse oximetry and re-evaluation of patient's condition.  I personally performed the services described in this  documentation, which was scribed in my presence. The recorded information has been reviewed and is accurate.   David Octave, MD 01/24/15 [phone]

## 2015-01-23 NOTE — ED Notes (Signed)
Patient is diabetic. Patient requested blood glucose reading. CBG 210

## 2015-01-23 NOTE — ED Notes (Signed)
Pt c/o increasing SOB over past few days, unrelieved by rescue inhaler.

## 2015-01-23 NOTE — ED Notes (Signed)
New onset pitting edema noted to bilateral ankles and feet.

## 2015-01-23 NOTE — Progress Notes (Signed)
Called by Dr. Nelida Meuse and [facility]. 74 year old male with diabetes, hypertension, history of bronchial asthma (not on inhalers regularly) presented with five-day history of shortness of breath. Not wheezing on exam, x-ray showed a right-sided moderate/large pleural effusion along with a right eighth rib fracture. Patient did fall apparently 2 weeks back. Case was then discussed with trauma surgeon-Dr. Maryjo Rochester did not recommend a chest tube as this was likely 22 weeks old and likely a hemothorax. He recommended a CT chest, and admission to the hospitalist service. CT chest showed mildly displaced fractures of the right sixth, seventh and eighth rib, along with a large pleural effusion. Patient stable except for tachycardia, accepted to a step down bed. Will require cardiothoracic consult, Dr. Manus Gunning will call and give CT surgery heads up. Please call CT surgery when patient gets to [facility].

## 2015-01-23 NOTE — Progress Notes (Signed)
Md notified of new pt.  sbp 200 new orders received. Will continue to monitor David Huynh T

## 2015-01-23 NOTE — H&P (Signed)
[facility] History and Physical  [name]:[id number] DOB: [birth year] DOA: 01/23/2015  Referring physician: EDP PCP: Danise Edge, MD   Chief Complaint: SOB   HPI: David Huynh is a 74 y.o. male with h/o Asthma.  Patient presents to the ED at [facility] with 1-1.5 week onset of gradually worsening SOB.  Is improved somewhat with albuterol inhaler.  2 weeks ago he fell on R side and had severe rib pain after that for about a week.  Pain has resolved but SOB continues to worsen.  Review of Systems: Systems reviewed.  As above, otherwise negative  Past Medical History  Diagnosis Date  . Hyperlipidemia   . Obesity   . Allergy     seasonal  . Asthma     mild, intermittent  . Diabetes mellitus     type 2  . Hypertension   . Liver function study, abnormal 04/26/2011   Past Surgical History  Procedure Laterality Date  . Appendectomy  1975  . Tonsillectomy and adenoidectomy  1947  . Cataract extraction  2010    b/l  . Retinal laser procedure  1985   Social History:  reports that he has never smoked. He has never used smokeless tobacco. He reports that he drinks alcohol. He reports that he does not use illicit drugs.  No Known Allergies  History reviewed. No pertinent family history.   Prior to Admission medications   Medication Sig Start Date End Date Taking? Authorizing Provider  acetaminophen (TYLENOL) 500 MG tablet Take 500 mg by mouth 3 (three) times daily as needed (pain).   Yes Historical Provider, MD  albuterol (VENTOLIN HFA) 108 (90 BASE) MCG/ACT inhaler Inhale 2 puffs into the lungs as needed. Patient taking differently: Inhale 2 puffs into the lungs every 6 (six) hours as needed for wheezing or shortness of breath.  10/29/12  Yes Bradd Canary, MD  cetirizine (ZYRTEC) 10 MG tablet Take 10 mg by mouth at bedtime.    Yes Historical Provider, MD  CINNAMON PO Take 1 capsule by mouth at bedtime.    Yes Historical Provider, MD  Multiple Vitamin  (MULTIVITAMIN WITH MINERALS) TABS tablet Take 1 tablet by mouth at bedtime.    Yes Historical Provider, MD  TURMERIC PO Take 1 capsule by mouth at bedtime.    Yes Historical Provider, MD  colesevelam [facility]) 625 MG tablet Take 3 tablets (1,875 mg total) by mouth 2 (two) times daily with a meal. 04/26/11 04/25/12  Bradd Canary, MD   Physical Exam: Filed Vitals:   01/23/15 2100  BP: 202/113  Pulse: 112  Temp:   Resp: 19    BP 202/113 mmHg  Pulse 112  Temp(Src) 97.4 F (36.3 C) (Oral)  Resp 19  Ht 6' (1.829 m)  Wt 123.7 kg (272 lb 11.3 oz)  BMI 36.98 kg/m2  SpO2 97%  General Appearance:    Alert, oriented, no distress, appears stated age  Head:    Normocephalic, atraumatic  Eyes:    PERRL, EOMI, sclera non-icteric        Nose:   Nares without drainage or epistaxis. Mucosa, turbinates normal  Throat:   Moist mucous membranes. Oropharynx without erythema or exudate.  Neck:   Supple. No carotid bruits.  No thyromegaly.  No lymphadenopathy.   Back:     No CVA tenderness, no spinal tenderness  Lungs:     Diminished on right  Chest wall:    No tenderness to palpitation  Heart:  Regular rate and rhythm without murmurs, gallops, rubs  Abdomen:     Soft, non-tender, nondistended, normal bowel sounds, no organomegaly  Genitalia:    deferred  Rectal:    deferred  Extremities:   No clubbing, cyanosis or edema.  Pulses:   2+ and symmetric all extremities  Skin:   Skin color, texture, turgor normal, no rashes or lesions  Lymph nodes:   Cervical, supraclavicular, and axillary nodes normal  Neurologic:   CNII-XII intact. Normal strength, sensation and reflexes      throughout    Labs on Admission:  Basic Metabolic Panel:  Recent Labs Lab 01/23/15 1515  NA 138  K 3.8  CL 100*  CO2 28  GLUCOSE 248*  BUN 12  CREATININE 0.85  CALCIUM 9.2   Liver Function Tests: No results for input(s): AST, ALT, ALKPHOS, BILITOT, PROT, ALBUMIN in the last 168 hours. No results for  input(s): LIPASE, AMYLASE in the last 168 hours. No results for input(s): AMMONIA in the last 168 hours. CBC:  Recent Labs Lab 01/23/15 1515  WBC 10.1  NEUTROABS 6.4  HGB 14.7  HCT 44.0  MCV 92.4  PLT 332   Cardiac Enzymes:  Recent Labs Lab 01/23/15 1515  TROPONINI 0.09*    BNP (last 3 results) No results for input(s): PROBNP in the last 8760 hours. CBG:  Recent Labs Lab 01/23/15 1714  GLUCAP 210*    Radiological Exams on Admission: Dg Chest 2 View  01/23/2015   CLINICAL DATA:  Shortness of breath for 1 week. History of asthma and diabetes. No reported history of trauma.  EXAM: CHEST  2 VIEW  COMPARISON:  10/01/2007.  FINDINGS: Dense opacity in the RIGHT mid to lower lung zone associated with a fracture of the RIGHT eighth rib laterally. Suspected hemothorax. No visible pneumothorax. Compressive atelectasis at the RIGHT base with or without consolidation. Normal LEFT lung. Cardiomediastinal silhouette may be slightly displaced RIGHT to LEFT. Thoracic atherosclerosis.  The central RIGHT hilar structures are poorly visualized, as is the RIGHT mainstem bronchus, and a central obstructing lesion as a cause for the RIGHT lung opacity is not excluded although is less likely. Consider CT chest with contrast for further evaluation.  IMPRESSION: Acute RIGHT lateral eighth rib fracture, although no reported history of trauma.  Significant Opacity of the RIGHT hemithorax, query hemothorax and compressive atelectasis. See discussion above.  Recommend CT chest with contrast for further evaluation.   Electronically Signed   By: Elsie Stain M.D.   On: 01/23/2015 15:06   Ct Chest W Contrast  01/23/2015   CLINICAL DATA:  Shortness of breath.  Right rib fracture.  EXAM: CT CHEST, ABDOMEN, AND PELVIS WITH CONTRAST  TECHNIQUE: Multidetector CT imaging of the chest, abdomen and pelvis was performed following the standard protocol during bolus administration of intravenous contrast.  CONTRAST:   OMNIPAQUE IOHEXOL 300 MG/ML SOLN, 25mL OMNIPAQUE IOHEXOL 300 MG/ML SOLN  COMPARISON:  Radiograph of same day.  FINDINGS: CT CHEST FINDINGS  Acute mildly displaced fractures are seen involving the lateral portions of the right sixth, seventh and eighth ribs. No pneumothorax is noted. Large right pleural effusion is noted with complete atelectasis of the right lower lobe and right middle lobe. Left lung is clear. This results in mild mediastinal shift to the left. There is no evidence of thoracic aortic dissection or aneurysm. Mildly enlarged lymph nodes are noted in the mediastinum which most likely are reactive or inflammatory in origin as none appear to be greater  than 1 cm in size.  CT ABDOMEN AND PELVIS FINDINGS  No gallstones are noted. The liver, spleen and pancreas appear normal. Adrenal glands appear normal. No hydronephrosis or renal obstruction is noted. No renal or ureteral calculi are noted. 4.3 cm exophytic cyst arises posteriorly from the upper pole of left kidney. Right kidney appears normal. Sigmoid diverticulosis is noted without inflammation. Urinary bladder appears normal. No significant adenopathy is noted. Atherosclerosis of abdominal aorta is noted without aneurysm formation.  IMPRESSION: Mildly displaced fractures are seen involving the lateral portions of the right sixth, seventh and eighth ribs. Large right pleural effusion is noted with complete atelectasis of the right middle and lower lobes. This results in mild mediastinal shift to the left.  Atherosclerosis of abdominal aorta is noted without aneurysm formation.  Sigmoid diverticulosis is noted without inflammation.  No acute abnormality is noted in the abdomen or pelvis.   Electronically Signed   By: Lupita Raider, M.D.   On: 01/23/2015 17:20   Ct Abdomen Pelvis W Contrast  01/23/2015   CLINICAL DATA:  Shortness of breath.  Right rib fracture.  EXAM: CT CHEST, ABDOMEN, AND PELVIS WITH CONTRAST  TECHNIQUE: Multidetector CT  imaging of the chest, abdomen and pelvis was performed following the standard protocol during bolus administration of intravenous contrast.  CONTRAST:  OMNIPAQUE IOHEXOL 300 MG/ML SOLN, 25mL OMNIPAQUE IOHEXOL 300 MG/ML SOLN  COMPARISON:  Radiograph of same day.  FINDINGS: CT CHEST FINDINGS  Acute mildly displaced fractures are seen involving the lateral portions of the right sixth, seventh and eighth ribs. No pneumothorax is noted. Large right pleural effusion is noted with complete atelectasis of the right lower lobe and right middle lobe. Left lung is clear. This results in mild mediastinal shift to the left. There is no evidence of thoracic aortic dissection or aneurysm. Mildly enlarged lymph nodes are noted in the mediastinum which most likely are reactive or inflammatory in origin as none appear to be greater than 1 cm in size.  CT ABDOMEN AND PELVIS FINDINGS  No gallstones are noted. The liver, spleen and pancreas appear normal. Adrenal glands appear normal. No hydronephrosis or renal obstruction is noted. No renal or ureteral calculi are noted. 4.3 cm exophytic cyst arises posteriorly from the upper pole of left kidney. Right kidney appears normal. Sigmoid diverticulosis is noted without inflammation. Urinary bladder appears normal. No significant adenopathy is noted. Atherosclerosis of abdominal aorta is noted without aneurysm formation.  IMPRESSION: Mildly displaced fractures are seen involving the lateral portions of the right sixth, seventh and eighth ribs. Large right pleural effusion is noted with complete atelectasis of the right middle and lower lobes. This results in mild mediastinal shift to the left.  Atherosclerosis of abdominal aorta is noted without aneurysm formation.  Sigmoid diverticulosis is noted without inflammation.  No acute abnormality is noted in the abdomen or pelvis.   Electronically Signed   By: Lupita Raider, M.D.   On: 01/23/2015 17:20    EKG: Independently  reviewed.  Assessment/Plan Principal Problem:   Hemothorax on right Active Problems:   Hypertension   Diabetes mellitus   1. R hemothorax - due to rib fractures and from fall 2 weeks ago 1. Has been slowly worsening since then most likely 2. Now is very large, CTS consulted for possible intervention 3. NPO and only SCDs for DVT PPx 2. HTN - PRN labetalol (patient takes none of his BP meds at home) 3. DM2 - low dose SSI q4h  Dr. Laneta Simmers consulted, he is currently in OR.  Code Status: Full Code  Family Communication: Family at bedside Disposition Plan: Admit to inpatient  Time spent: 70 min  GARDNER, JARED M. [facility] Pager [phone]  If 7AM-7PM, please contact the day team taking care of the patient [email or web address] Password [facility] 01/23/2015, 9:37 PM

## 2015-01-23 NOTE — ED Notes (Signed)
MD at bedside. 

## 2015-01-23 NOTE — ED Notes (Signed)
Patient transported to CT 

## 2015-01-24 ENCOUNTER — Inpatient Hospital Stay (HOSPITAL_COMMUNITY): Payer: Medicare Other | Admitting: Anesthesiology

## 2015-01-24 ENCOUNTER — Inpatient Hospital Stay (HOSPITAL_COMMUNITY): Payer: Medicare Other

## 2015-01-24 ENCOUNTER — Encounter (HOSPITAL_COMMUNITY)
Admission: EM | Disposition: A | Discharge status: Home / Self Care | Payer: Self-pay | Source: Home / Self Care | Attending: Surgery

## 2015-01-24 ENCOUNTER — Encounter (HOSPITAL_COMMUNITY): Payer: Self-pay | Admitting: Anesthesiology

## 2015-01-24 DIAGNOSIS — E1165 Type 2 diabetes mellitus with hyperglycemia: Secondary | ICD-10-CM

## 2015-01-24 DIAGNOSIS — I10 Essential (primary) hypertension: Secondary | ICD-10-CM | POA: Insufficient documentation

## 2015-01-24 DIAGNOSIS — S2231XA Fracture of one rib, right side, initial encounter for closed fracture: Secondary | ICD-10-CM

## 2015-01-24 DIAGNOSIS — J90 Pleural effusion, not elsewhere classified: Secondary | ICD-10-CM | POA: Insufficient documentation

## 2015-01-24 DIAGNOSIS — J942 Hemothorax: Secondary | ICD-10-CM | POA: Diagnosis present

## 2015-01-24 DIAGNOSIS — E785 Hyperlipidemia, unspecified: Secondary | ICD-10-CM | POA: Insufficient documentation

## 2015-01-24 HISTORY — PX: VIDEO ASSISTED THORACOSCOPY (VATS)/THOROCOTOMY: SHX6173

## 2015-01-24 LAB — POCT I-STAT 3, ART BLOOD GAS (G3+)
ACID-BASE EXCESS: 2 mmol/L (ref 0.0–2.0)
ACID-BASE EXCESS: 2 mmol/L (ref 0.0–2.0)
BICARBONATE: 25.4 meq/L — AB (ref 20.0–24.0)
BICARBONATE: 26.6 meq/L — AB (ref 20.0–24.0)
BICARBONATE: 25.7 meq/L — AB (ref 20.0–24.0)
O2 Saturation: 97 %
O2 Saturation: 98 %
O2 Saturation: 97 %
PCO2 ART: 40.5 mmHg (ref 35.0–45.0)
PCO2 ART: 37 mmHg (ref 35.0–45.0)
PH ART: 7.423 (ref 7.350–7.450)
PH ART: 7.448 (ref 7.350–7.450)
PO2 ART: 85 mmHg (ref 80.0–100.0)
PO2 ART: 99 mmHg (ref 80.0–100.0)
PO2 ART: 80 mmHg (ref 80.0–100.0)
Patient temperature: 97.5
Patient temperature: 97.6
TCO2: 27 mmol/L (ref 0–100)
TCO2: 28 mmol/L (ref 0–100)
TCO2: 27 mmol/L (ref 0–100)
pCO2 arterial: 41.7 mmHg (ref 35.0–45.0)
pH, Arterial: 7.39 (ref 7.350–7.450)

## 2015-01-24 LAB — MRSA PCR SCREENING: MRSA by PCR: NEGATIVE

## 2015-01-24 LAB — GLUCOSE, CAPILLARY
GLUCOSE-CAPILLARY: 222 mg/dL — AB (ref 65–99)
Glucose-Capillary: 198 mg/dL — ABNORMAL HIGH (ref 65–99)
Glucose-Capillary: 199 mg/dL — ABNORMAL HIGH (ref 65–99)
Glucose-Capillary: 208 mg/dL — ABNORMAL HIGH (ref 65–99)
Glucose-Capillary: 229 mg/dL — ABNORMAL HIGH (ref 65–99)
Glucose-Capillary: 262 mg/dL — ABNORMAL HIGH (ref 65–99)

## 2015-01-24 LAB — GRAM STAIN

## 2015-01-24 LAB — POCT I-STAT, CHEM 8
BUN: 10 mg/dL (ref 6–20)
CALCIUM ION: 1.13 mmol/L (ref 1.13–1.30)
CHLORIDE: 101 mmol/L (ref 101–111)
CREATININE: 0.7 mg/dL (ref 0.61–1.24)
GLUCOSE: 265 mg/dL — AB (ref 65–99)
HCT: 35 % — ABNORMAL LOW (ref 39.0–52.0)
HEMOGLOBIN: 11.9 g/dL — AB (ref 13.0–17.0)
Potassium: 4.1 mmol/L (ref 3.5–5.1)
Sodium: 139 mmol/L (ref 135–145)
TCO2: 24 mmol/L (ref 0–100)

## 2015-01-24 LAB — CBC
HCT: 44.2 % (ref 39.0–52.0)
HEMOGLOBIN: 15 g/dL (ref 13.0–17.0)
MCH: 31.8 pg (ref 26.0–34.0)
MCHC: 33.9 g/dL (ref 30.0–36.0)
MCV: 93.6 fL (ref 78.0–100.0)
PLATELETS: 333 10*3/uL (ref 150–400)
RBC: 4.72 MIL/uL (ref 4.22–5.81)
RDW: 11.9 % (ref 11.5–15.5)
WBC: 12.5 10*3/uL — ABNORMAL HIGH (ref 4.0–10.5)

## 2015-01-24 LAB — BASIC METABOLIC PANEL
Anion gap: 11 (ref 5–15)
BUN: 7 mg/dL (ref 6–20)
CHLORIDE: 105 mmol/L (ref 101–111)
CO2: 25 mmol/L (ref 22–32)
CREATININE: 0.8 mg/dL (ref 0.61–1.24)
Calcium: 9.3 mg/dL (ref 8.9–10.3)
GFR calc Af Amer: >60 mL/min (ref >60)
GFR calc non Af Amer: >60 mL/min (ref >60)
GLUCOSE: 195 mg/dL — AB (ref 65–99)
POTASSIUM: 3.9 mmol/L (ref 3.5–5.1)
Sodium: 141 mmol/L (ref 135–145)

## 2015-01-24 LAB — SURGICAL PCR SCREEN
MRSA, PCR: NEGATIVE
STAPHYLOCOCCUS AUREUS: NEGATIVE

## 2015-01-24 LAB — ABO/RH: ABO/RH(D): A POS

## 2015-01-24 LAB — PREPARE RBC (CROSSMATCH)

## 2015-01-24 SURGERY — VIDEO ASSISTED THORACOSCOPY (VATS)/THOROCOTOMY
Anesthesia: General | Site: Chest | Laterality: Right

## 2015-01-24 MED ORDER — BUPIVACAINE-EPINEPHRINE (PF) 0.25% -1:200000 IJ SOLN
INTRAMUSCULAR | Status: AC
  Filled 2015-01-24: qty 30

## 2015-01-24 MED ORDER — MORPHINE SULFATE (PF) 4 MG/ML IV SOLN
4.0000 mg | INTRAVENOUS | Status: DC | PRN
  Administered 2015-01-24 – 2015-01-25 (×3): 4 mg via INTRAVENOUS
  Filled 2015-01-24 (×2): qty 1

## 2015-01-24 MED ORDER — DEXMEDETOMIDINE HCL IN NACL 200 MCG/50ML IV SOLN
INTRAVENOUS | Status: DC | PRN
  Administered 2015-01-24: .7 ug/kg/h via INTRAVENOUS

## 2015-01-24 MED ORDER — HYDROMORPHONE HCL 1 MG/ML IJ SOLN
0.2500 mg | INTRAMUSCULAR | Status: DC | PRN
  Administered 2015-01-24 (×4): 0.5 mg via INTRAVENOUS

## 2015-01-24 MED ORDER — HYDROMORPHONE HCL 1 MG/ML IJ SOLN
INTRAMUSCULAR | Status: AC
  Administered 2015-01-24: 0.5 mg via INTRAVENOUS
  Filled 2015-01-24: qty 2

## 2015-01-24 MED ORDER — METOCLOPRAMIDE HCL 5 MG/ML IJ SOLN
10.0000 mg | Freq: Four times a day (QID) | INTRAMUSCULAR | Status: DC
Start: 2015-01-24 — End: 2015-01-28
  Administered 2015-01-24 – 2015-01-28 (×15): 10 mg via INTRAVENOUS
  Filled 2015-01-24 (×19): qty 2

## 2015-01-24 MED ORDER — ALBUTEROL SULFATE (2.5 MG/3ML) 0.083% IN NEBU: 2.5000 mg | INHALATION_SOLUTION | RESPIRATORY_TRACT | Status: DC

## 2015-01-24 MED ORDER — ACETAMINOPHEN 160 MG/5ML PO SOLN: 1000.0000 mg | Freq: Four times a day (QID) | ORAL | Status: DC

## 2015-01-24 MED ORDER — OXYCODONE HCL 5 MG PO TABS
5.0000 mg | ORAL_TABLET | ORAL | Status: DC | PRN
  Administered 2015-01-25 (×4): 5 mg via ORAL
  Filled 2015-01-24 (×4): qty 1

## 2015-01-24 MED ORDER — MORPHINE SULFATE (PF) 4 MG/ML IV SOLN
INTRAVENOUS | Status: AC
  Administered 2015-01-24: 4 mg via INTRAVENOUS
  Filled 2015-01-24: qty 1

## 2015-01-24 MED ORDER — ACETAMINOPHEN 500 MG PO TABS
1000.0000 mg | ORAL_TABLET | Freq: Four times a day (QID) | ORAL | Status: DC
  Administered 2015-01-24 – 2015-01-25 (×3): 1000 mg via ORAL
  Filled 2015-01-24 (×6): qty 2

## 2015-01-24 MED ORDER — ROCURONIUM BROMIDE 100 MG/10ML IV SOLN
INTRAVENOUS | Status: DC | PRN
  Administered 2015-01-24 (×2): 50 mg via INTRAVENOUS

## 2015-01-24 MED ORDER — INSULIN DETEMIR 100 UNIT/ML ~~LOC~~ SOLN
15.0000 [IU] | Freq: Every day | SUBCUTANEOUS | Status: DC
  Administered 2015-01-24: 15 [IU] via SUBCUTANEOUS
  Filled 2015-01-24 (×2): qty 0.15

## 2015-01-24 MED ORDER — SODIUM CHLORIDE 0.9 % IV SOLN
INTRAVENOUS | Status: DC
Start: 2015-01-24 — End: 2015-01-25
  Administered 2015-01-24: 21:00:00 via INTRAVENOUS

## 2015-01-24 MED ORDER — MIDAZOLAM HCL 5 MG/5ML IJ SOLN
INTRAMUSCULAR | Status: DC | PRN
  Administered 2015-01-24: 2 mg via INTRAVENOUS

## 2015-01-24 MED ORDER — IPRATROPIUM-ALBUTEROL 0.5-2.5 (3) MG/3ML IN SOLN
3.0000 mL | RESPIRATORY_TRACT | Status: DC
  Administered 2015-01-24 – 2015-01-25 (×4): 3 mL via RESPIRATORY_TRACT
  Filled 2015-01-24 (×4): qty 3

## 2015-01-24 MED ORDER — DEXMEDETOMIDINE HCL IN NACL 200 MCG/50ML IV SOLN
INTRAVENOUS | Status: AC
  Filled 2015-01-24: qty 50

## 2015-01-24 MED ORDER — IPRATROPIUM BROMIDE 0.02 % IN SOLN: 0.5000 mg | RESPIRATORY_TRACT | Status: DC

## 2015-01-24 MED ORDER — BISACODYL 5 MG PO TBEC
10.0000 mg | DELAYED_RELEASE_TABLET | Freq: Every day | ORAL | Status: DC
  Administered 2015-01-25: 10 mg via ORAL
  Filled 2015-01-24 (×4): qty 2

## 2015-01-24 MED ORDER — LUNG SURGERY BOOK
Freq: Once | Status: AC
  Administered 2015-01-24: 1
  Filled 2015-01-24: qty 1

## 2015-01-24 MED ORDER — SODIUM CHLORIDE 0.9 % IV SOLN
10.0000 mg | INTRAVENOUS | Status: DC | PRN
  Administered 2015-01-24: 50 ug/min via INTRAVENOUS

## 2015-01-24 MED ORDER — CEFUROXIME SODIUM 750 MG IJ SOLR
INTRAMUSCULAR | Status: AC
Start: 2015-01-24 — End: 2015-01-24
  Filled 2015-01-24: qty 750

## 2015-01-24 MED ORDER — DEXTROSE 5 % IV SOLN
1.5000 g | Freq: Two times a day (BID) | INTRAVENOUS | Status: DC
  Administered 2015-01-24: 1.5 g via INTRAVENOUS
  Filled 2015-01-24: qty 1.5

## 2015-01-24 MED ORDER — HYDRALAZINE HCL 20 MG/ML IJ SOLN
10.0000 mg | Freq: Four times a day (QID) | INTRAMUSCULAR | Status: DC | PRN
  Administered 2015-01-26: 10 mg via INTRAVENOUS
  Filled 2015-01-24: qty 1

## 2015-01-24 MED ORDER — SUCCINYLCHOLINE CHLORIDE 20 MG/ML IJ SOLN
INTRAMUSCULAR | Status: DC | PRN
  Administered 2015-01-24: 100 mg via INTRAVENOUS

## 2015-01-24 MED ORDER — PROPOFOL 10 MG/ML IV BOLUS
INTRAVENOUS | Status: DC | PRN
  Administered 2015-01-24: 125 mg via INTRAVENOUS

## 2015-01-24 MED ORDER — LACTATED RINGERS IV SOLN
INTRAVENOUS | Status: DC | PRN
  Administered 2015-01-24: 08:00:00 via INTRAVENOUS

## 2015-01-24 MED ORDER — PROPOFOL 10 MG/ML IV BOLUS
INTRAVENOUS | Status: AC
  Filled 2015-01-24: qty 20

## 2015-01-24 MED ORDER — FENTANYL CITRATE (PF) 100 MCG/2ML IJ SOLN
INTRAMUSCULAR | Status: DC | PRN
  Administered 2015-01-24 (×2): 50 ug via INTRAVENOUS

## 2015-01-24 MED ORDER — ALBUMIN HUMAN 5 % IV SOLN
INTRAVENOUS | Status: DC | PRN
  Administered 2015-01-24: 09:00:00 via INTRAVENOUS

## 2015-01-24 MED ORDER — SENNOSIDES-DOCUSATE SODIUM 8.6-50 MG PO TABS
1.0000 | ORAL_TABLET | Freq: Every day | ORAL | Status: DC
  Administered 2015-01-25 – 2015-01-28 (×4): 1 via ORAL
  Filled 2015-01-24 (×6): qty 1

## 2015-01-24 MED ORDER — DEXMEDETOMIDINE HCL IN NACL 200 MCG/50ML IV SOLN: 0.4000 ug/kg/h | INTRAVENOUS | Status: DC

## 2015-01-24 MED ORDER — POTASSIUM CHLORIDE 10 MEQ/50ML IV SOLN
10.0000 meq | Freq: Every day | INTRAVENOUS | Status: DC | PRN
  Administered 2015-01-25 (×3): 10 meq via INTRAVENOUS
  Filled 2015-01-24 (×3): qty 50

## 2015-01-24 MED ORDER — FENTANYL CITRATE (PF) 250 MCG/5ML IJ SOLN
INTRAMUSCULAR | Status: AC
  Filled 2015-01-24: qty 5

## 2015-01-24 MED ORDER — LIDOCAINE HCL (CARDIAC) 20 MG/ML IV SOLN
INTRAVENOUS | Status: DC | PRN
  Administered 2015-01-24: 50 mg via INTRAVENOUS

## 2015-01-24 MED ORDER — ANTISEPTIC ORAL RINSE SOLUTION (CORINZ)
7.0000 mL | Freq: Four times a day (QID) | OROMUCOSAL | Status: DC
  Administered 2015-01-24 – 2015-01-25 (×3): 7 mL via OROMUCOSAL

## 2015-01-24 MED ORDER — DEXMEDETOMIDINE HCL IN NACL 400 MCG/100ML IV SOLN
0.4000 ug/kg/h | INTRAVENOUS | Status: DC
  Administered 2015-01-24: 0.7 ug/kg/h via INTRAVENOUS
  Administered 2015-01-24: 0.4 ug/kg/h via INTRAVENOUS
  Filled 2015-01-24 (×3): qty 100

## 2015-01-24 MED ORDER — INSULIN ASPART 100 UNIT/ML ~~LOC~~ SOLN
0.0000 [IU] | SUBCUTANEOUS | Status: DC
  Administered 2015-01-24 (×2): 12 [IU] via SUBCUTANEOUS
  Administered 2015-01-25 (×2): 4 [IU] via SUBCUTANEOUS

## 2015-01-24 MED ORDER — MIDAZOLAM HCL 2 MG/2ML IJ SOLN
INTRAMUSCULAR | Status: AC
  Filled 2015-01-24: qty 4

## 2015-01-24 MED ORDER — ONDANSETRON HCL 4 MG/2ML IJ SOLN: 4.0000 mg | Freq: Four times a day (QID) | INTRAMUSCULAR | Status: DC | PRN

## 2015-01-24 MED ORDER — CHLORHEXIDINE GLUCONATE 0.12% ORAL RINSE (MEDLINE KIT)
15.0000 mL | Freq: Two times a day (BID) | OROMUCOSAL | Status: DC
  Administered 2015-01-24: 15 mL via OROMUCOSAL

## 2015-01-24 MED ORDER — 0.9 % SODIUM CHLORIDE (POUR BTL) OPTIME
TOPICAL | Status: DC | PRN
  Administered 2015-01-24: 2000 mL

## 2015-01-24 SURGICAL SUPPLY — 58 items
APPLICATOR TIP EXT COSEAL (VASCULAR PRODUCTS) IMPLANT
CANISTER SUCTION 2500CC (MISCELLANEOUS) ×3 IMPLANT
CATH KIT ON Q 5IN SLV (PAIN MANAGEMENT) IMPLANT
CATH THORACIC 28FR (CATHETERS) IMPLANT
CATH THORACIC 36FR (CATHETERS) IMPLANT
CATH THORACIC 36FR RT ANG (CATHETERS) IMPLANT
CLIP TI MEDIUM 6 (CLIP) ×3 IMPLANT
CONT SPEC 4OZ CLIKSEAL STRL BL (MISCELLANEOUS) ×6 IMPLANT
COVER SURGICAL LIGHT HANDLE (MISCELLANEOUS) ×3 IMPLANT
DERMABOND ADVANCED (GAUZE/BANDAGES/DRESSINGS) ×2
DERMABOND ADVANCED .7 DNX12 (GAUZE/BANDAGES/DRESSINGS) ×1 IMPLANT
DRAPE LAPAROSCOPIC ABDOMINAL (DRAPES) ×3 IMPLANT
DRAPE WARM FLUID 44X44 (DRAPE) ×6 IMPLANT
ELECT BLADE 6.5 EXT (BLADE) ×3 IMPLANT
ELECT REM PT RETURN 9FT ADLT (ELECTROSURGICAL) ×3
ELECTRODE REM PT RTRN 9FT ADLT (ELECTROSURGICAL) ×1 IMPLANT
GAUZE SPONGE 4X4 12PLY STRL (GAUZE/BANDAGES/DRESSINGS) ×3 IMPLANT
GLOVE EUDERMIC 7 POWDERFREE (GLOVE) ×6 IMPLANT
GOWN STRL REUS W/ TWL LRG LVL3 (GOWN DISPOSABLE) ×2 IMPLANT
GOWN STRL REUS W/ TWL XL LVL3 (GOWN DISPOSABLE) ×1 IMPLANT
GOWN STRL REUS W/TWL LRG LVL3 (GOWN DISPOSABLE) ×4
GOWN STRL REUS W/TWL XL LVL3 (GOWN DISPOSABLE) ×2
HEMOSTAT SURGICEL 2X14 (HEMOSTASIS) IMPLANT
KIT BASIN OR (CUSTOM PROCEDURE TRAY) ×3 IMPLANT
KIT ROOM TURNOVER OR (KITS) ×3 IMPLANT
NS IRRIG 1000ML POUR BTL (IV SOLUTION) ×12 IMPLANT
PACK CHEST (CUSTOM PROCEDURE TRAY) ×3 IMPLANT
PAD ARMBOARD 7.5X6 YLW CONV (MISCELLANEOUS) ×6 IMPLANT
SEALANT SURG COSEAL 4ML (VASCULAR PRODUCTS) IMPLANT
SEALANT SURG COSEAL 8ML (VASCULAR PRODUCTS) IMPLANT
SOLUTION ANTI FOG 6CC (MISCELLANEOUS) ×3 IMPLANT
SUT PROLENE 3 0 SH DA (SUTURE) IMPLANT
SUT PROLENE 4 0 RB 1 (SUTURE)
SUT PROLENE 4-0 RB1 .5 CRCL 36 (SUTURE) IMPLANT
SUT SILK  1 MH (SUTURE) ×4
SUT SILK 1 MH (SUTURE) ×2 IMPLANT
SUT SILK 1 TIES 10X30 (SUTURE) IMPLANT
SUT SILK 2 0 SH (SUTURE) IMPLANT
SUT SILK 2 0SH CR/8 30 (SUTURE) IMPLANT
SUT VIC AB 1 CTX 18 (SUTURE) ×3 IMPLANT
SUT VIC AB 1 CTX 36 (SUTURE)
SUT VIC AB 1 CTX36XBRD ANBCTR (SUTURE) IMPLANT
SUT VIC AB 2-0 CTX 36 (SUTURE) ×6 IMPLANT
SUT VIC AB 2-0 UR6 27 (SUTURE) IMPLANT
SUT VIC AB 3-0 MH 27 (SUTURE) IMPLANT
SUT VIC AB 3-0 X1 27 (SUTURE) ×6 IMPLANT
SUT VICRYL 2 TP 1 (SUTURE) IMPLANT
SWAB COLLECTION DEVICE MRSA (MISCELLANEOUS) ×3 IMPLANT
SYSTEM SAHARA CHEST DRAIN RE-I (WOUND CARE) ×3 IMPLANT
TAPE CLOTH 4X10 WHT NS (GAUZE/BANDAGES/DRESSINGS) ×3 IMPLANT
TAPE CLOTH SURG 4X10 WHT LF (GAUZE/BANDAGES/DRESSINGS) ×3 IMPLANT
TIP APPLICATOR SPRAY EXTEND 16 (VASCULAR PRODUCTS) IMPLANT
TOWEL OR 17X24 6PK STRL BLUE (TOWEL DISPOSABLE) ×6 IMPLANT
TOWEL OR 17X26 10 PK STRL BLUE (TOWEL DISPOSABLE) ×6 IMPLANT
TRAP SPECIMEN MUCOUS 40CC (MISCELLANEOUS) ×3 IMPLANT
TRAY FOLEY CATH 14FRSI W/METER (CATHETERS) ×3 IMPLANT
TUBE ANAEROBIC SPECIMEN COL (MISCELLANEOUS) ×3 IMPLANT
WATER STERILE IRR 1000ML POUR (IV SOLUTION) ×3 IMPLANT

## 2015-01-24 NOTE — Anesthesia Procedure Notes (Addendum)
Procedure Name: Intubation Date/Time: 01/24/2015 8:02 AM Performed by: Carmela Rima Pre-anesthesia Checklist: Patient being monitored, Suction available, Patient identified, Emergency Drugs available and Timeout performed Patient Re-evaluated:Patient Re-evaluated prior to inductionOxygen Delivery Method: Circle system utilized Preoxygenation: Pre-oxygenation with 100% oxygen Intubation Type: IV induction and Rapid sequence Ventilation: Mask ventilation without difficulty Laryngoscope Size: Glidescope and 3 Grade View: Grade II Tube type: Oral Tube size: 8.0 mm Number of attempts: 2 Placement Confirmation: breath sounds checked- equal and bilateral,  positive ETCO2 and ETT inserted through vocal cords under direct vision Secured at: 23 cm Tube secured with: Tape Dental Injury: Teeth and Oropharynx as per pre-operative assessment     Procedure Name: Intubation Date/Time: 01/24/2015 8:09 AM Performed by: Carmela Rima Pre-anesthesia Checklist: Timeout performed, Patient being monitored, Suction available, Emergency Drugs available and Patient identified Oxygen Delivery Method: Circle system utilized Endobronchial tube: Left, EBT position confirmed by fiberoptic bronchoscope, Double lumen EBT and EBT position confirmed by auscultation and 41 Fr Intubation method: Tube exchanger used. Placement Confirmation: breath sounds checked- equal and bilateral,  positive ETCO2 and ETT inserted through vocal cords under direct vision Tube secured with: Tape Dental Injury: Teeth and Oropharynx as per pre-operative assessment     Procedure Name: Intubation Date/Time: 01/24/2015 9:30 AM Performed by: Carmela Rima Pre-anesthesia Checklist: Timeout performed, Patient being monitored, Suction available, Emergency Drugs available and Patient identified Patient Re-evaluated:Patient Re-evaluated prior to inductionOxygen Delivery Method: Circle system utilized Laryngoscope Size: Glidescope  (in conjunctioin with tube exchanger) Grade View: Grade II Tube type: Subglottic suction tube Tube size: 8.0 mm Number of attempts: 1 Placement Confirmation: breath sounds checked- equal and bilateral,  positive ETCO2 and ETT inserted through vocal cords under direct vision Secured at: 22 cm Tube secured with: Tape Dental Injury: Teeth and Oropharynx as per pre-operative assessment

## 2015-01-24 NOTE — Op Note (Signed)
CARDIOTHORACIC SURGERY OPERATIVE NOTE:  [name] [id number] 01/24/2015   Preoperative Dx:  Large right hemothorax  Postoperative Dx: same   Procedure: Right video-assisted thoracoscopy, drainage of hemothorax  Surgeon: Dr. Alleen Borne   Assistant: none  Anesthesia: GET   Clinical History:   The patient is a 74 year old gentleman with hyperlipidemia, hypertension, DM and asthma who fell on right side about 2 weeks ago and had severe right chest wall pain for about a week. He had some wheezing and shortness of breath improved with inhaler. Then about 5 days ago he began having more shortness of breath that has progressed to severe today. He presented to [facility] and CXR and CT chest shows right rib fractures and large right effusion/hemothorax with complete collapse of the lower and middle lobes and partial collapse of the upper lobe.  He has a large right pleural effusion with near complete collapse of the right lung and progressive shortness of breath. He fell and suffered 3 right rib fractures 2 weeks ago and probably had some bleeding in the pleural space followed by progressive effusion. I think the best treatment is right VATS for complete drainage and reexpansion of the lung. I discussed the operative procedure with the patient including alternatives, benefits and risks; including but not limited to bleeding, blood transfusion, infection, recurrent effusion and failure of the lung to completely expand. [name] understands and agrees to proceed.  Operative procedure:   The patient was brought to the preoperative holding area. He was in distress with tachypnea, very short of breath and had low oxygen sats. He was therefore taken directly back to the OR and intubated with a single lumen tube by anesthesia. A central line and arterial line were inserted by anesthesia. The proper patient, proper operative side, proper operation were confirmed after  reviewing his history and chest x-ray. The right side of the chest was signed by me. Preoperative intravenous antibiotics were given. The single lumen tube was converted to a double-lumen tube and a Foley catheter was placed in the bladder using sterile technique. Lower extremity sequential compression devices were used. The patient was positioned in the left lateral decubitus position with the right side up. The right side of the chest was prepped with Betadine soap and solution and draped in the usual sterile manner. A timeout was taken and the proper patient, proper operation, and proper operative side were confirmed with nursing and anesthesia staff. A 1 cm incision was made in the midaxillary line at about the eighth intercostal space. The right pleural space was entered bluntly with a hemostat and an 8 mm trocar was inserted. A long suction was inserted and 4L of old bloody fluid was removed. A sample was sent for culture.  The 30 thoracoscope was inserted and the pleural space inspected. The pleural space was unremarkable. All of the fluid was drained and there was no clot. I did not see any displaced rib fracture entering the pleural space. There was some adhesion of the upper lobe to the chest wall laterally and this could have been the site of the previous injury. I did not disturb these adhesions because they appeared vascular. A 28 F chest tube was then inserted through the trocar site and sutured to the skin. The chest tube was connected to Pleur-evac suction. The sponge needle and instrument counts were correct according to the scrub nurse. The patient was then turned into the supine position, the double lumen  tube converted to a single lumen tube and he was transported to the post anesthesia care unit in satisfactory and stable condition.

## 2015-01-24 NOTE — Progress Notes (Signed)
[facility] PROGRESS NOTE  David Huynh ZOX:[id number] DOB: Jun 13, 1940 DOA: 01/23/2015 PCP: Danise Edge, MD  Assessment/Plan: 1-right side CP and hemothorax/pleural effusion: patient right lung almost completely collapsed -plan is for VATS later today -continue oxygen supplementation -supportive care and PRN analgesics -will follow CVTS rec's  2-ribs fractures: will continue PRN pain meds  -will encourage use of IS/ flutter valve  3-HTN: uncontrolled -patient not compliant with antihypertensive drugs at home -counseling about compliance provided -will use PRN hydralazine and labetalol  -resume and adjust antihypertensive drugs when taking PO's again  4-HLD: will resume statins when taking PO's again  5-DM type 2: will check A1c -holding oral hypoglycemic agents while inpatient -will use SSI  Code Status: Full Family Communication: no family at bedside Disposition Plan: to be determine; remains inpatient    Consultants:  CVTS  Procedures:  Right lung VATS: planned for today 9/11  Antibiotics:  None   HPI/Subjective: Complaining of right side CP and with SOB. No fever  Objective: Filed Vitals:   01/24/15 0725  BP: 185/114  Pulse: 96  Temp:   Resp: 30    Intake/Output Summary (Last 24 hours) at 01/24/15 0920 Last data filed at 01/24/15 0917  Gross per 24 hour  Intake    370 ml  Output   4950 ml  Net  -4580 ml   Filed Weights   01/23/15 1426 01/23/15 2058  Weight: 111.131 kg (245 lb) 123.7 kg (272 lb 11.3 oz)    Exam:   General:  Afebrile, complaining of right side chest pain  Cardiovascular: mild sinus tachycardia, no rubs or gallops  Respiratory: tachypneic and with shallow breath, no wheezing, no crackles  Abdomen: soft, NT, ND, positive BS  Musculoskeletal: no edema or cyanosis   Data Reviewed: Basic Metabolic Panel:  Recent Labs Lab 01/23/15 1515 01/24/15 0256  NA 138 141  K 3.8 3.9  CL 100* 105  CO2 28 25   GLUCOSE 248* 195*  BUN 12 7  CREATININE 0.85 0.80  CALCIUM 9.2 9.3   CBC:  Recent Labs Lab 01/23/15 1515 01/24/15 0256  WBC 10.1 12.5*  NEUTROABS 6.4  --   HGB 14.7 15.0  HCT 44.0 44.2  MCV 92.4 93.6  PLT 332 333   Cardiac Enzymes:  Recent Labs Lab 01/23/15 1515  TROPONINI 0.09*   BNP (last 3 results)  Recent Labs  01/23/15 1515  BNP 134.3*   CBG:  Recent Labs Lab 01/23/15 1714 01/23/15 2158 01/23/15 2351 01/24/15 0405 01/24/15 0658  GLUCAP 210* 207* 199* 208* 198*    Recent Results (from the past 240 hour(s))  MRSA PCR Screening     Status: None   Collection Time: 01/23/15 10:54 PM  Result Value Ref Range Status   MRSA by PCR NEGATIVE NEGATIVE Final    Comment:        The GeneXpert MRSA Assay (FDA approved for NASAL specimens only), is one component of a comprehensive MRSA colonization surveillance program. It is not intended to diagnose MRSA infection nor to guide or monitor treatment for MRSA infections.   Surgical pcr screen     Status: None   Collection Time: 01/24/15  4:04 AM  Result Value Ref Range Status   MRSA, PCR NEGATIVE NEGATIVE Final   Staphylococcus aureus NEGATIVE NEGATIVE Final    Comment:        The Xpert SA Assay (FDA approved for NASAL specimens in patients over 71 years of age), is one component of a comprehensive surveillance  program.  Test performance has been validated by [facility] for patients greater than or equal to 75 year old. It is not intended to diagnose infection nor to guide or monitor treatment.      Studies: Dg Chest 2 View  01/23/2015   CLINICAL DATA:  Shortness of breath for 1 week. History of asthma and diabetes. No reported history of trauma.  EXAM: CHEST  2 VIEW  COMPARISON:  10/01/2007.  FINDINGS: Dense opacity in the RIGHT mid to lower lung zone associated with a fracture of the RIGHT eighth rib laterally. Suspected hemothorax. No visible pneumothorax. Compressive atelectasis at the RIGHT  base with or without consolidation. Normal LEFT lung. Cardiomediastinal silhouette may be slightly displaced RIGHT to LEFT. Thoracic atherosclerosis.  The central RIGHT hilar structures are poorly visualized, as is the RIGHT mainstem bronchus, and a central obstructing lesion as a cause for the RIGHT lung opacity is not excluded although is less likely. Consider CT chest with contrast for further evaluation.  IMPRESSION: Acute RIGHT lateral eighth rib fracture, although no reported history of trauma.  Significant Opacity of the RIGHT hemithorax, query hemothorax and compressive atelectasis. See discussion above.  Recommend CT chest with contrast for further evaluation.   Electronically Signed   By: Elsie Stain M.D.   On: 01/23/2015 15:06   Ct Chest W Contrast  01/23/2015   CLINICAL DATA:  Shortness of breath.  Right rib fracture.  EXAM: CT CHEST, ABDOMEN, AND PELVIS WITH CONTRAST  TECHNIQUE: Multidetector CT imaging of the chest, abdomen and pelvis was performed following the standard protocol during bolus administration of intravenous contrast.  CONTRAST:  OMNIPAQUE IOHEXOL 300 MG/ML SOLN, 81mL OMNIPAQUE IOHEXOL 300 MG/ML SOLN  COMPARISON:  Radiograph of same day.  FINDINGS: CT CHEST FINDINGS  Acute mildly displaced fractures are seen involving the lateral portions of the right sixth, seventh and eighth ribs. No pneumothorax is noted. Large right pleural effusion is noted with complete atelectasis of the right lower lobe and right middle lobe. Left lung is clear. This results in mild mediastinal shift to the left. There is no evidence of thoracic aortic dissection or aneurysm. Mildly enlarged lymph nodes are noted in the mediastinum which most likely are reactive or inflammatory in origin as none appear to be greater than 1 cm in size.  CT ABDOMEN AND PELVIS FINDINGS  No gallstones are noted. The liver, spleen and pancreas appear normal. Adrenal glands appear normal. No hydronephrosis or renal  obstruction is noted. No renal or ureteral calculi are noted. 4.3 cm exophytic cyst arises posteriorly from the upper pole of left kidney. Right kidney appears normal. Sigmoid diverticulosis is noted without inflammation. Urinary bladder appears normal. No significant adenopathy is noted. Atherosclerosis of abdominal aorta is noted without aneurysm formation.  IMPRESSION: Mildly displaced fractures are seen involving the lateral portions of the right sixth, seventh and eighth ribs. Large right pleural effusion is noted with complete atelectasis of the right middle and lower lobes. This results in mild mediastinal shift to the left.  Atherosclerosis of abdominal aorta is noted without aneurysm formation.  Sigmoid diverticulosis is noted without inflammation.  No acute abnormality is noted in the abdomen or pelvis.   Electronically Signed   By: Lupita Raider, M.D.   On: 01/23/2015 17:20   Ct Abdomen Pelvis W Contrast  01/23/2015   CLINICAL DATA:  Shortness of breath.  Right rib fracture.  EXAM: CT CHEST, ABDOMEN, AND PELVIS WITH CONTRAST  TECHNIQUE: Multidetector CT imaging of  the chest, abdomen and pelvis was performed following the standard protocol during bolus administration of intravenous contrast.  CONTRAST:  OMNIPAQUE IOHEXOL 300 MG/ML SOLN, 25mL OMNIPAQUE IOHEXOL 300 MG/ML SOLN  COMPARISON:  Radiograph of same day.  FINDINGS: CT CHEST FINDINGS  Acute mildly displaced fractures are seen involving the lateral portions of the right sixth, seventh and eighth ribs. No pneumothorax is noted. Large right pleural effusion is noted with complete atelectasis of the right lower lobe and right middle lobe. Left lung is clear. This results in mild mediastinal shift to the left. There is no evidence of thoracic aortic dissection or aneurysm. Mildly enlarged lymph nodes are noted in the mediastinum which most likely are reactive or inflammatory in origin as none appear to be greater than 1 cm in size.  CT ABDOMEN  AND PELVIS FINDINGS  No gallstones are noted. The liver, spleen and pancreas appear normal. Adrenal glands appear normal. No hydronephrosis or renal obstruction is noted. No renal or ureteral calculi are noted. 4.3 cm exophytic cyst arises posteriorly from the upper pole of left kidney. Right kidney appears normal. Sigmoid diverticulosis is noted without inflammation. Urinary bladder appears normal. No significant adenopathy is noted. Atherosclerosis of abdominal aorta is noted without aneurysm formation.  IMPRESSION: Mildly displaced fractures are seen involving the lateral portions of the right sixth, seventh and eighth ribs. Large right pleural effusion is noted with complete atelectasis of the right middle and lower lobes. This results in mild mediastinal shift to the left.  Atherosclerosis of abdominal aorta is noted without aneurysm formation.  Sigmoid diverticulosis is noted without inflammation.  No acute abnormality is noted in the abdomen or pelvis.   Electronically Signed   By: Lupita Raider, M.D.   On: 01/23/2015 17:20    Scheduled Meds: . cefUROXime (ZINACEF)  IV  1.5 g Intravenous To SS-Surg  . [MAR Hold] Influenza vac split quadrivalent PF  0.5 mL Intramuscular Tomorrow-1000  . [MAR Hold] insulin aspart  0-9 Units Subcutaneous 6 times per day  . [MAR Hold] pneumococcal 23 valent vaccine  0.5 mL Intramuscular Tomorrow-1000   Continuous Infusions:   Principal Problem:   Hemothorax on right Active Problems:   Hypertension   Diabetes mellitus    Time spent: 25 minutes    Vassie Loll  [facility] Pager [phone]. If 7PM-7AM, please contact night-coverage at [email or web address], password [email or web address], 9:20 AM  LOS: 1 day

## 2015-01-24 NOTE — Procedures (Signed)
Extubation Procedure Note  Patient Details:   Name: DEMARCOS PALOMARES DOB: [birth year] MRN: [id number]   Airway Documentation:  Airway 8 mm (Active)  Secured at (cm) 23 cm 01/24/2015  7:36 PM  Measured From Teeth 01/24/2015  7:36 PM  Secured Location Right 01/24/2015  7:36 PM  Secured By [employer] 01/24/2015  7:36 PM  Tube Holder Repositioned Yes 01/24/2015  7:36 PM  Site Condition Dry 01/24/2015  7:36 PM    Evaluation  O2 sats: stable throughout Complications: No apparent complications Patient did tolerate procedure well. Bilateral Breath Sounds: Clear, Diminished Suctioning: Oral, Airway Yes  Patient was extubated to 4 LPM nasal cannula. Patient SPO2 100% and tolerating well. Patient had good cuff leak prior to extubation. NIF was -25 and VC was 3.1 L with good effort. Able to lift head of bed, follow commands. Able to speak name with clear BBS and in upper airways. Patient tolerated being extubated. RN at bedside and working with IS. RT will continue to monitor.   Tyaira Heward M 01/24/2015, 9:12 PM

## 2015-01-24 NOTE — Brief Op Note (Signed)
01/23/2015 - 01/24/2015  9:03 AM  PATIENT:  David Huynh  74 y.o. male  PRE-OPERATIVE DIAGNOSIS:  hemothorax  POST-OPERATIVE DIAGNOSIS:  hemothorax  PROCEDURE:  Procedure(s): VIDEO ASSISTED THORACOSCOPY (VATS) FOR DRAINAGE OF RIGHT HEMOTHORAX (Right)  SURGEON:  Surgeon(s) and Role:    * Alleen Borne, MD - Primary  PHYSICIAN ASSISTANT: none  ASSISTANTS: none   ANESTHESIA:   general  EBL:     BLOOD ADMINISTERED:none  DRAINS: 1 28 F Chest Tube(s) in the right pleural space   LOCAL MEDICATIONS USED:  NONE  SPECIMEN:  Source of Specimen:  right pleural fluid  DISPOSITION OF SPECIMEN:  micro  COUNTS:  YES  TOURNIQUET:  * No tourniquets in log *  DICTATION: .Note written in EPIC  PLAN OF CARE: Admit to inpatient   PATIENT DISPOSITION:  ICU - intubated and hemodynamically stable.   Delay start of Pharmacological VTE agent (>24hrs) due to surgical blood loss or risk of bleeding: yes

## 2015-01-24 NOTE — Anesthesia Preprocedure Evaluation (Addendum)
Anesthesia Evaluation  Patient identified by MRN, date of birth, ID band Patient awake    Reviewed: Allergy & Precautions, H&P , NPO status , Patient's Chart, lab work & pertinent test results  Airway Mallampati: III  TM Distance: >3 FB Neck ROM: Full    Dental no notable dental hx. (+) Teeth Intact, Dental Advisory Given   Pulmonary shortness of breath, asthma ,     + decreased breath sounds unstable     Cardiovascular hypertension,  Rhythm:Regular Rate:Tachycardia     Neuro/Psych negative neurological ROS  negative psych ROS   GI/Hepatic negative GI ROS, Neg liver ROS,   Endo/Other  diabetes, Type 2, Oral Hypoglycemic Agents  Renal/GU negative Renal ROS  negative genitourinary   Musculoskeletal   Abdominal   Peds  Hematology negative hematology ROS (+)   Anesthesia Other Findings   Reproductive/Obstetrics negative OB ROS                            Anesthesia Physical Anesthesia Plan  ASA: III  Anesthesia Plan: General   Post-op Pain Management:    Induction: Intravenous  Airway Management Planned: Double Lumen EBT  Additional Equipment: Arterial line and CVP  Intra-op Plan:   Post-operative Plan: Post-operative intubation/ventilation  Informed Consent: I have reviewed the patients History and Physical, chart, labs and discussed the procedure including the risks, benefits and alternatives for the proposed anesthesia with the patient or authorized representative who has indicated his/her understanding and acceptance.   Dental advisory given  Plan Discussed with: CRNA  Anesthesia Plan Comments:        Anesthesia Quick Evaluation

## 2015-01-24 NOTE — Transfer of Care (Signed)
Immediate Anesthesia Transfer of Care Note  Patient: David Huynh  Procedure(s) Performed: Procedure(s): VIDEO ASSISTED THORACOSCOPY (VATS) FOR DRAINAGE OF RIGHT HEMOTHORAX (Right)  Patient Location: PACU  Anesthesia Type:General  Level of Consciousness: Patient remains intubated per anesthesia plan  Airway & Oxygen Therapy: Patient remains intubated per anesthesia plan and Patient placed on Ventilator (see vital sign flow sheet for setting)  Post-op Assessment: Report given to RN and Post -op Vital signs reviewed and stable  Post vital signs: Reviewed and stable  Last Vitals:  Filed Vitals:   01/24/15 0725  BP: 185/114  Pulse: 96  Temp:   Resp: 30    Complications: No apparent anesthesia complications

## 2015-01-24 NOTE — Anesthesia Postprocedure Evaluation (Signed)
  Anesthesia Post-op Note  Patient: David Huynh  Procedure(s) Performed: Procedure(s): VIDEO ASSISTED THORACOSCOPY (VATS) FOR DRAINAGE OF RIGHT HEMOTHORAX (Right)  Patient Location: ICU  Anesthesia Type:General  Level of Consciousness: sedated and unresponsive  Airway and Oxygen Therapy: Patient remains intubated and on ventilator  Post-op Pain: none  Post-op Assessment: Post-op Vital signs reviewed, Patient's Cardiovascular Status Stable and Respiratory Function Stable  Post-op Vital Signs: Reviewed  Filed Vitals:   01/24/15 1055  BP: 95/52  Pulse: 66  Temp:   Resp: 14    Complications: No apparent anesthesia complications

## 2015-01-24 NOTE — Progress Notes (Signed)
Utilization review completed.  

## 2015-01-24 NOTE — Progress Notes (Signed)
Spoke with Dr. Laneta Simmers about plan for extubation after being told by PACU RN that we were not to wean/extubate. Dr. Laneta Simmers states to proceed with wean and extubation per TCTS rapid wean protocol. Selena Batten, RT notified.   Evern Bio 01/24/2015 6:28 PM

## 2015-01-25 ENCOUNTER — Inpatient Hospital Stay (HOSPITAL_COMMUNITY): Payer: Medicare Other

## 2015-01-25 ENCOUNTER — Encounter (HOSPITAL_COMMUNITY): Payer: Self-pay | Admitting: Surgery

## 2015-01-25 LAB — POCT I-STAT 3, ART BLOOD GAS (G3+)
ACID-BASE EXCESS: 3 mmol/L — AB (ref 0.0–2.0)
Bicarbonate: 27.5 mEq/L — ABNORMAL HIGH (ref 20.0–24.0)
O2 Saturation: 96 %
PH ART: 7.434 (ref 7.350–7.450)
TCO2: 29 mmol/L (ref 0–100)
pCO2 arterial: 40.9 mmHg (ref 35.0–45.0)
pO2, Arterial: 81 mmHg (ref 80.0–100.0)

## 2015-01-25 LAB — GLUCOSE, CAPILLARY
GLUCOSE-CAPILLARY: 166 mg/dL — AB (ref 65–99)
GLUCOSE-CAPILLARY: 184 mg/dL — AB (ref 65–99)
GLUCOSE-CAPILLARY: 188 mg/dL — AB (ref 65–99)
GLUCOSE-CAPILLARY: 82 mg/dL (ref 65–99)
Glucose-Capillary: 261 mg/dL — ABNORMAL HIGH (ref 65–99)
Glucose-Capillary: 105 mg/dL — ABNORMAL HIGH (ref 65–99)
Glucose-Capillary: 127 mg/dL — ABNORMAL HIGH (ref 65–99)

## 2015-01-25 LAB — BASIC METABOLIC PANEL
ANION GAP: 6 (ref 5–15)
BUN: 10 mg/dL (ref 6–20)
CALCIUM: 8.5 mg/dL — AB (ref 8.9–10.3)
CO2: 27 mmol/L (ref 22–32)
Chloride: 108 mmol/L (ref 101–111)
Creatinine, Ser: 0.76 mg/dL (ref 0.61–1.24)
GFR calc Af Amer: >60 mL/min (ref >60)
GLUCOSE: 89 mg/dL (ref 65–99)
Potassium: 3.7 mmol/L (ref 3.5–5.1)
SODIUM: 141 mmol/L (ref 135–145)

## 2015-01-25 LAB — CBC
HCT: 35.6 % — ABNORMAL LOW (ref 39.0–52.0)
Hemoglobin: 12 g/dL — ABNORMAL LOW (ref 13.0–17.0)
MCH: 31 pg (ref 26.0–34.0)
MCHC: 33.7 g/dL (ref 30.0–36.0)
MCV: 92 fL (ref 78.0–100.0)
PLATELETS: 290 10*3/uL (ref 150–400)
RBC: 3.87 MIL/uL — AB (ref 4.22–5.81)
RDW: 11.7 % (ref 11.5–15.5)
WBC: 12.4 10*3/uL — AB (ref 4.0–10.5)

## 2015-01-25 LAB — HEMOGLOBIN A1C
Hgb A1c MFr Bld: 9.9 % — ABNORMAL HIGH (ref 4.8–5.6)
MEAN PLASMA GLUCOSE: 237 mg/dL

## 2015-01-25 MED ORDER — VANCOMYCIN HCL 10 G IV SOLR
2500.0000 mg | Freq: Once | INTRAVENOUS | Status: AC
  Administered 2015-01-25: 2500 mg via INTRAVENOUS
  Filled 2015-01-25 (×2): qty 2500

## 2015-01-25 MED ORDER — DOCUSATE SODIUM 100 MG PO CAPS
200.0000 mg | ORAL_CAPSULE | Freq: Every day | ORAL | Status: DC
Start: 2015-01-25 — End: 2015-01-28
  Administered 2015-01-25 – 2015-01-26 (×2): 200 mg via ORAL
  Filled 2015-01-25 (×2): qty 2

## 2015-01-25 MED ORDER — SODIUM CHLORIDE 0.9 % IJ SOLN
3.0000 mL | Freq: Two times a day (BID) | INTRAMUSCULAR | Status: DC
  Administered 2015-01-25 – 2015-01-27 (×6): 3 mL via INTRAVENOUS

## 2015-01-25 MED ORDER — INSULIN ASPART 100 UNIT/ML ~~LOC~~ SOLN
0.0000 [IU] | Freq: Three times a day (TID) | SUBCUTANEOUS | Status: DC
Start: 2015-01-25 — End: 2015-01-28
  Administered 2015-01-25: 2 [IU] via SUBCUTANEOUS
  Administered 2015-01-25: 4 [IU] via SUBCUTANEOUS
  Administered 2015-01-26: 5 [IU] via SUBCUTANEOUS
  Administered 2015-01-26: 4 [IU] via SUBCUTANEOUS
  Administered 2015-01-26 (×2): 2 [IU] via SUBCUTANEOUS
  Administered 2015-01-27: 4 [IU] via SUBCUTANEOUS
  Administered 2015-01-27 (×2): 2 [IU] via SUBCUTANEOUS
  Administered 2015-01-27: 8 [IU] via SUBCUTANEOUS
  Administered 2015-01-28: 4 [IU] via SUBCUTANEOUS

## 2015-01-25 MED ORDER — SODIUM CHLORIDE 0.9 % IJ SOLN: 3.0000 mL | INTRAMUSCULAR | Status: DC | PRN

## 2015-01-25 MED ORDER — ALBUTEROL SULFATE (2.5 MG/3ML) 0.083% IN NEBU: 2.5000 mg | INHALATION_SOLUTION | RESPIRATORY_TRACT | Status: DC | PRN

## 2015-01-25 MED ORDER — VANCOMYCIN HCL 10 G IV SOLR
1250.0000 mg | Freq: Two times a day (BID) | INTRAVENOUS | Status: DC
  Administered 2015-01-26 – 2015-01-28 (×5): 1250 mg via INTRAVENOUS
  Filled 2015-01-25 (×6): qty 1250

## 2015-01-25 MED ORDER — SODIUM CHLORIDE 0.9 % IV SOLN: 250.0000 mL | INTRAVENOUS | Status: DC | PRN

## 2015-01-25 MED ORDER — TRAMADOL HCL 50 MG PO TABS
50.0000 mg | ORAL_TABLET | ORAL | Status: DC | PRN
  Administered 2015-01-27 – 2015-01-28 (×2): 50 mg via ORAL
  Filled 2015-01-25 (×2): qty 1

## 2015-01-25 MED ORDER — METFORMIN HCL 500 MG PO TABS
500.0000 mg | ORAL_TABLET | Freq: Two times a day (BID) | ORAL | Status: DC
  Administered 2015-01-25 – 2015-01-27 (×6): 500 mg via ORAL
  Filled 2015-01-25 (×9): qty 1

## 2015-01-25 MED ORDER — IPRATROPIUM-ALBUTEROL 0.5-2.5 (3) MG/3ML IN SOLN
3.0000 mL | Freq: Three times a day (TID) | RESPIRATORY_TRACT | Status: DC
  Administered 2015-01-25 – 2015-01-29 (×10): 3 mL via RESPIRATORY_TRACT
  Filled 2015-01-25 (×12): qty 3

## 2015-01-25 NOTE — Progress Notes (Addendum)
TCTS DAILY ICU PROGRESS NOTE                   [address]            [name] [address]          [phone]   1 Day Post-Op Procedure(s) (LRB): VIDEO ASSISTED THORACOSCOPY (VATS) FOR DRAINAGE OF RIGHT HEMOTHORAX (Right)  Total Length of Stay:  LOS: 2 days   Subjective: OOB in chair. Feels much better since surgery, breathing significantly improved. Minimal pain.   Objective: Vital signs in last 24 hours: Temp:  [97.2 F (36.2 C)-97.9 F (36.6 C)] 97.9 F (36.6 C) (09/12 0400) Pulse Rate:  [64-94] 94 (09/12 0700) Cardiac Rhythm:  [-] Normal sinus rhythm (09/12 0600) Resp:  [11-29] 29 (09/12 0700) BP: (83-133)/(44-84) 119/58 mmHg (09/12 0600) SpO2:  [95 %-100 %] 100 % (09/12 0801) Arterial Line BP: (77-166)/(40-79) 156/59 mmHg (09/12 0700) FiO2 (%):  [40 %-50 %] 40 % (09/11 2032) Weight:  [265 lb 10.5 oz (120.5 kg)] 265 lb 10.5 oz (120.5 kg) (09/12 0600)  Filed Weights   01/23/15 1426 01/23/15 2058 01/25/15 0600  Weight: 245 lb (111.131 kg) 272 lb 11.3 oz (123.7 kg) 265 lb 10.5 oz (120.5 kg)    Weight change: 20 lb 10.5 oz (9.369 kg)   Hemodynamic parameters for last 24 hours:    Intake/Output from previous day: 09/11 0701 - 09/12 0700 In: 2108.1 [P.O.:120; I.V.:1598.1; NG/GT:90; IV Piggyback:300] Out: 5490 [Urine:990; Chest Tube:500]  Intake/Output this shift:    Current Meds: Scheduled Meds: . acetaminophen  1,000 mg Oral 4 times per day   Or  . acetaminophen (TYLENOL) oral liquid 160 mg/5 mL  1,000 mg Oral 4 times per day  . antiseptic oral rinse  7 mL Mouth Rinse QID  . bisacodyl  10 mg Oral Daily  . cefUROXime (ZINACEF)  IV  1.5 g Intravenous Q12H  . chlorhexidine gluconate  15 mL Mouth Rinse BID  . insulin aspart  0-24 Units Subcutaneous 6 times per day  . insulin detemir  15 Units Subcutaneous QHS  . ipratropium-albuterol  3 mL Nebulization Q4H WA  . metoCLOPramide (REGLAN) injection  10 mg Intravenous 4 times per day  .  senna-docusate  1 tablet Oral QHS   Continuous Infusions: . sodium chloride 10 mL/hr at 01/25/15 0700  . dexmedetomidine 0.7 mcg/kg/hr (01/24/15 1600)  . dexmedetomidine Stopped (01/24/15 2000)   PRN Meds:.hydrALAZINE, morphine injection, ondansetron (ZOFRAN) IV, oxyCODONE, potassium chloride  Physical Exam: General appearance: alert, cooperative and no distress Heart: regular rate and rhythm Lungs: Slightly decreased BS R base, overall clear Extremities: no edema, redness or tenderness in the calves or thighs Wound: Dressed and dry  Chest tube: No air leak  Lab Results: CBC: Recent Labs  01/24/15 0256 01/24/15 1703 01/25/15 0414  WBC 12.5*  --  12.4*  HGB 15.0 11.9* 12.0*  HCT 44.2 35.0* 35.6*  PLT 333  --  290   BMET:  Recent Labs  01/24/15 0256 01/24/15 1703 01/25/15 0414  NA 141 139 141  K 3.9 4.1 3.7  CL 105 101 108  CO2 25  --  27  GLUCOSE 195* 265* 89  BUN CREATININE 0.80 0.70 0.76  CALCIUM 9.3  --  8.5*    PT/INR: No results for input(s): LABPROT, INR in the last 72 hours. Radiology: Dg Chest Port 1 View  01/25/2015   CLINICAL DATA:  Recent drainage of hemothorax  EXAM: PORTABLE CHEST - 1 VIEW  COMPARISON:  January 24, 2015  FINDINGS: Central catheter tip is in the superior vena cava. Chest tube is present on the right. No appreciable pneumothorax. There is slight atelectasis in the right base. Lungs elsewhere clear. Heart is mildly enlarged with pulmonary vascularity within normal limits, stable. There are mildly displaced rib fractures on the right, stable.  IMPRESSION: Mild right base atelectasis. No appreciable pleural effusion. Tube and catheter positions as described without pneumothorax. There are displaced fractures of the right sixth, seventh, and eighth ribs.   Electronically Signed   By: Bretta Bang III M.D.   On: 01/25/2015 07:56   Dg Chest Port 1 View  01/24/2015   CLINICAL DATA:  Hemothorax  EXAM: PORTABLE CHEST - 1 VIEW   COMPARISON:  01/23/2015  FINDINGS: Significant decrease in right pleural effusion noted with right-sided chest tube in place terminating over the medial right lung apex. Multiple right-sided rib fractures are reidentified. No pneumothorax. Patchy left basilar airspace opacity is noted. Mild cardiomegaly. Trace left pleural effusion or obscuration by soft tissue artifact.  IMPRESSION: Significant decrease in right pleural effusion since previously with chest tube in place.   Electronically Signed   By: Christiana Pellant M.D.   On: 01/24/2015 10:20     Assessment/Plan: S/P Procedure(s) (LRB): VIDEO ASSISTED THORACOSCOPY (VATS) FOR DRAINAGE OF RIGHT HEMOTHORAX (Right)  CXR stable, CT with no air leak. Will place CT to water seal.  Still with around 500 ml output.  Mobliize, work on [employer]. D/c A-line, Foley, decrease IVF.  Transfer to stepdown.  [name],[name] 01/25/2015 8:11 AM   I have seen and examined the patient and agree with the assessment and plan as outlined.  CXR looks good.  Mobilize.  Transfer stepdown.  Purcell Nails 01/25/2015 8:26 AM

## 2015-01-25 NOTE — Progress Notes (Signed)
Pt had episode of rapid hr with attempting to void, denied any SOB or CP. Pt resumed SR/ST without any interventions.

## 2015-01-25 NOTE — Progress Notes (Signed)
Patient ID: David Huynh, male   DOB: 08-08-1940, 74 y.o.   MRN: [id number] EVENING ROUNDS NOTE :     [address]       [place] [address]             [phone]                 1 Day Post-Op Procedure(s) (LRB): VIDEO ASSISTED THORACOSCOPY (VATS) FOR DRAINAGE OF RIGHT HEMOTHORAX (Right)  Total Length of Stay:  LOS: 2 days  BP 129/76 mmHg  Pulse 98  Temp(Src) 98.3 F (36.8 C) (Oral)  Resp 25  Ht 6' (1.829 m)  Wt 265 lb 10.5 oz (120.5 kg)  BMI 36.02 kg/m2  SpO2 96%  .Intake/Output      09/11 0701 - 09/12 0700 09/12 0701 - 09/13 0700   P.O. 120 720   I.V. (mL/kg) 1598.1 (13.3) 60 (0.5)   NG/GT 90    IV Piggyback 300    Total Intake(mL/kg) 2108.1 (17.5) 780 (6.5)   Urine (mL/kg/hr) 990 (0.3) 60 (0)   Emesis/NG output 0 (0)    Other 4000 (1.4)    Stool     Chest Tube 500 (0.2) 100 (0.1)   Total Output 5490 160   Net -3382 +620              Lab Results  Component Value Date   WBC 12.4* 01/25/2015   HGB 12.0* 01/25/2015   HCT 35.6* 01/25/2015   PLT 290 01/25/2015   GLUCOSE 89 01/25/2015   CHOL 299* 04/17/2011   TRIG 374.0* 04/17/2011   HDL 55.20 04/17/2011   LDLDIRECT 199.5 04/17/2011   ALT 130* 04/17/2011   AST 85* 04/17/2011   NA 141 01/25/2015   K 3.7 01/25/2015   CL 108 01/25/2015   CREATININE 0.76 01/25/2015   BUN 10 01/25/2015   CO2 27 01/25/2015   TSH 0.61 04/17/2011   HGBA1C 9.9* 01/24/2015   MICROALBUR 17.6* 04/17/2011   Stable day Foley out,  has not voided yet  Delight Ovens MD  Beeper [phone] Office [phone] 01/25/2015 5:26 PM

## 2015-01-25 NOTE — Progress Notes (Signed)
Called 3S at 22:20 to give report due to bed placement stating room was clean.  I called back at 22:40 and was told nurse is unavailable to take report. I called and asked to speak to charge nurse and he was unable to get report.

## 2015-01-25 NOTE — Progress Notes (Signed)
Inpatient Diabetes Program Recommendations  AACE/ADA: New Consensus Statement on Inpatient Glycemic Control (2013)  Target Ranges:  Prepandial:   less than 140 mg/dL      Peak postprandial:   less than 180 mg/dL (1-2 hours)      Critically ill patients:  140 - 180 mg/dL   Review of Glycemic Control:  Results for JAMION, REAGAN (MRN [id number]) as of 01/25/2015 09:23  Ref. Range 01/24/2015 15:16 01/24/2015 17:02 01/24/2015 19:38 01/25/2015 00:23 01/25/2015 03:52  Glucose-Capillary Latest Ref Range: 65-99 mg/dL 884 (H) 166 (H) 063 (H) 184 (H) 82    Diabetes history: Type 2 diabetes Outpatient Diabetes medications: Metformin 1000 mg tid-Medication reconciliation states that patient is not taking  Current orders for Inpatient glycemic control:  TCTS correction q 4 hours  Please consider checking A1C to determine pre-hospitalization glycemic control.  Consider changing Novolog correction to moderate tid with meals and HS scale.  May need to continue Levemir 15 units daily.   Thanks, Beryl Meager, RN, BC-ADM Inpatient Diabetes Coordinator Pager [phone] (8a-5p)

## 2015-01-25 NOTE — Consult Note (Signed)
ANTIBIOTIC CONSULT NOTE - INITIAL  Pharmacy Consult for vancomycin Indication: lung abscess  No Known Allergies  Patient Measurements: Height: 6' (182.9 cm) Weight: 265 lb 10.5 oz (120.5 kg) IBW/kg (Calculated) : 77.6  Vital Signs: Temp: 98.4 F (36.9 C) (09/12 1100) Temp Source: Oral (09/12 0400) BP: 116/63 mmHg (09/12 1400) Pulse Rate: 105 (09/12 1400) Intake/Output from previous day: 09/11 0701 - 09/12 0700 In: 2108.1 [P.O.:120; I.V.:1598.1; NG/GT:90; IV Piggyback:300] Out: 5490 [Urine:990; Chest Tube:500] Intake/Output from this shift: Total I/O In: 780 [P.O.:720; I.V.:60] Out: 160 [Urine:60; Chest Tube:100]  Labs:  Recent Labs  01/23/15 1515 01/24/15 0256 01/24/15 1703 01/25/15 0414  WBC 10.1 12.5*  --  12.4*  HGB 14.7 15.0 11.9* 12.0*  PLT 332 333  --  290  CREATININE 0.85 0.80 0.70 0.76   Estimated Creatinine Clearance: 110.3 mL/min (by C-G formula based on Cr of 0.76). No results for input(s): VANCOTROUGH, VANCOPEAK, VANCORANDOM, GENTTROUGH, GENTPEAK, GENTRANDOM, TOBRATROUGH, TOBRAPEAK, TOBRARND, AMIKACINPEAK, AMIKACINTROU, AMIKACIN in the last 72 hours.   Microbiology: Recent Results (from the past 720 hour(s))  MRSA PCR Screening     Status: None   Collection Time: 01/23/15 10:54 PM  Result Value Ref Range Status   MRSA by PCR NEGATIVE NEGATIVE Final    Comment:        The GeneXpert MRSA Assay (FDA approved for NASAL specimens only), is one component of a comprehensive MRSA colonization surveillance program. It is not intended to diagnose MRSA infection nor to guide or monitor treatment for MRSA infections.   Surgical pcr screen     Status: None   Collection Time: 01/24/15  4:04 AM  Result Value Ref Range Status   MRSA, PCR NEGATIVE NEGATIVE Final   Staphylococcus aureus NEGATIVE NEGATIVE Final    Comment:        The Xpert SA Assay (FDA approved for NASAL specimens in patients over 46 years of age), is one component of a comprehensive  surveillance program.  Test performance has been validated by [facility] for patients greater than or equal to 65 year old. It is not intended to diagnose infection nor to guide or monitor treatment.   Culture, body fluid-bottle     Status: None (Preliminary result)   Collection Time: 01/24/15  8:53 AM  Result Value Ref Range Status   Specimen Description FLUID PLEURAL RIGHT  Final   Special Requests NONE  Final   Gram Stain   Final    GRAM POSITIVE COCCI IN CLUSTERS IN BOTH AEROBIC AND ANAEROBIC BOTTLES CRITICAL RESULT CALLED TO, READ BACK BY AND VERIFIED WITH: S HARDY,RN AT 4782 01/25/15 BY L BENFIELD    Culture GRAM POSITIVE COCCI  Final   Report Status PENDING  Incomplete  Gram stain     Status: None   Collection Time: 01/24/15  8:53 AM  Result Value Ref Range Status   Specimen Description FLUID PLEURAL RIGHT  Final   Special Requests NONE  Final   Gram Stain   Final    FEW WBC PRESENT,BOTH PMN AND MONONUCLEAR NO ORGANISMS SEEN    Report Status 01/24/2015 FINAL  Final    Medical History: Past Medical History  Diagnosis Date  . Hyperlipidemia   . Obesity   . Allergy     seasonal  . Asthma     mild, intermittent  . Diabetes mellitus     type 2  . Hypertension   . Liver function study, abnormal 04/26/2011   Assessment: 74 yo male admitted with  SOB on 9/10. Gradual onset over 1 - 2 weeks. Recent fall 2 weeks ago with R hemothorax  PMH: HLD, Asthma, DM2, HTN  ID: ABX for lung abscess/infected pleural effusion. WBC 12.4, AF  Vancomycin 9/12>>  Pleural Fluid 9/11>> GPC   Renal: SCr 0.76  Goal of Therapy:  Vancomycin trough level 15-20 mcg/ml  Plan:  -Vancomycin 2500 mg x1 then 1250 mg q 12h -Monitor clinical status, culture results, VT prn  Isaac Bliss, PharmD, [facility] Clinical Pharmacist Pager [phone] 01/25/2015 3:43 PM

## 2015-01-25 NOTE — Progress Notes (Signed)
eLink Physician-Brief Progress Note Patient Name: David Huynh DOB: 12-04-1940 MRN: [id number]   Date of Service  01/25/2015  HPI/Events of Note  reviewing cultures, NOTED cocci clusters pleural space. Reviewed am CVT S noted. Output remains up. Consider addition Vanc   eICU Interventions  D/w CVTS. Will add vanc, they will follow ID and sens pattern and clinical significance     Intervention Category Major Interventions: Infection - evaluation and management  FEINSTEIN,DANIEL J. 01/25/2015, 3:32 PM

## 2015-01-26 ENCOUNTER — Inpatient Hospital Stay (HOSPITAL_COMMUNITY): Payer: Medicare Other

## 2015-01-26 LAB — TYPE AND SCREEN
ABO/RH(D): A POS
Antibody Screen: NEGATIVE
UNIT DIVISION: 0
UNIT DIVISION: 0

## 2015-01-26 LAB — BASIC METABOLIC PANEL
ANION GAP: 10 (ref 5–15)
BUN: 9 mg/dL (ref 6–20)
CALCIUM: 8.6 mg/dL — AB (ref 8.9–10.3)
CO2: 25 mmol/L (ref 22–32)
Chloride: 101 mmol/L (ref 101–111)
Creatinine, Ser: 0.92 mg/dL (ref 0.61–1.24)
GFR calc Af Amer: >60 mL/min (ref >60)
GLUCOSE: 169 mg/dL — AB (ref 65–99)
POTASSIUM: 3.6 mmol/L (ref 3.5–5.1)
SODIUM: 136 mmol/L (ref 135–145)

## 2015-01-26 LAB — CBC
HCT: 40.4 % (ref 39.0–52.0)
Hemoglobin: 13.4 g/dL (ref 13.0–17.0)
MCH: 30.7 pg (ref 26.0–34.0)
MCHC: 33.2 g/dL (ref 30.0–36.0)
MCV: 92.7 fL (ref 78.0–100.0)
PLATELETS: 345 10*3/uL (ref 150–400)
RBC: 4.36 MIL/uL (ref 4.22–5.81)
RDW: 11.8 % (ref 11.5–15.5)
WBC: 11.9 10*3/uL — AB (ref 4.0–10.5)

## 2015-01-26 LAB — GLUCOSE, CAPILLARY
GLUCOSE-CAPILLARY: 144 mg/dL — AB (ref 65–99)
Glucose-Capillary: 175 mg/dL — ABNORMAL HIGH (ref 65–99)
Glucose-Capillary: 160 mg/dL — ABNORMAL HIGH (ref 65–99)
Glucose-Capillary: 217 mg/dL — ABNORMAL HIGH (ref 65–99)

## 2015-01-26 LAB — POCT I-STAT 7, (LYTES, BLD GAS, ICA,H+H)
ACID-BASE DEFICIT: 1 mmol/L (ref 0.0–2.0)
Bicarbonate: 27.5 mEq/L — ABNORMAL HIGH (ref 20.0–24.0)
CALCIUM ION: 1.17 mmol/L (ref 1.13–1.30)
HEMATOCRIT: 44 % (ref 39.0–52.0)
HEMOGLOBIN: 15 g/dL (ref 13.0–17.0)
O2 Saturation: 100 %
POTASSIUM: 4.3 mmol/L (ref 3.5–5.1)
Patient temperature: 36.5
SODIUM: 137 mmol/L (ref 135–145)
TCO2: 29 mmol/L (ref 0–100)
pCO2 arterial: 61.6 mmHg (ref 35.0–45.0)
pH, Arterial: 7.255 — ABNORMAL LOW (ref 7.350–7.450)
pO2, Arterial: 215 mmHg — ABNORMAL HIGH (ref 80.0–100.0)

## 2015-01-26 LAB — POCT I-STAT 4, (NA,K, GLUC, HGB,HCT)
GLUCOSE: 232 mg/dL — AB (ref 65–99)
HCT: 57 % — ABNORMAL HIGH (ref 39.0–52.0)
Hemoglobin: 19.4 g/dL — ABNORMAL HIGH (ref 13.0–17.0)
Potassium: 4.3 mmol/L (ref 3.5–5.1)
Sodium: 139 mmol/L (ref 135–145)

## 2015-01-26 MED ORDER — ROSUVASTATIN CALCIUM 40 MG PO TABS
40.0000 mg | ORAL_TABLET | Freq: Every day | ORAL | Status: DC
  Administered 2015-01-26 – 2015-01-28 (×3): 40 mg via ORAL
  Filled 2015-01-26 (×4): qty 1

## 2015-01-26 MED ORDER — TAMSULOSIN HCL 0.4 MG PO CAPS
0.4000 mg | ORAL_CAPSULE | Freq: Every day | ORAL | Status: DC
  Administered 2015-01-26 – 2015-01-29 (×4): 0.4 mg via ORAL
  Filled 2015-01-26 (×5): qty 1

## 2015-01-26 MED ORDER — HYDROCHLOROTHIAZIDE 12.5 MG PO CAPS
12.5000 mg | ORAL_CAPSULE | Freq: Every day | ORAL | Status: DC
  Administered 2015-01-26 – 2015-01-29 (×4): 12.5 mg via ORAL
  Filled 2015-01-26 (×4): qty 1

## 2015-01-26 MED ORDER — POTASSIUM CHLORIDE CRYS ER 20 MEQ PO TBCR
40.0000 meq | EXTENDED_RELEASE_TABLET | Freq: Once | ORAL | Status: AC
  Administered 2015-01-26: 40 meq via ORAL
  Filled 2015-01-26: qty 2

## 2015-01-26 MED ORDER — AMLODIPINE BESYLATE 5 MG PO TABS
5.0000 mg | ORAL_TABLET | Freq: Every day | ORAL | Status: DC
  Administered 2015-01-26 – 2015-01-29 (×4): 5 mg via ORAL
  Filled 2015-01-26 (×4): qty 1

## 2015-01-26 MED ORDER — LISINOPRIL 10 MG PO TABS
10.0000 mg | ORAL_TABLET | Freq: Every day | ORAL | Status: DC
  Administered 2015-01-26: 10 mg via ORAL
  Filled 2015-01-26 (×2): qty 1

## 2015-01-26 NOTE — Care Management Important Message (Signed)
Important Message  Patient Details  Name: David Huynh MRN: [id number] Date of Birth: 28-Sep-1940   Medicare Important Message Given:  Yes-second notification given    Kyla Balzarine 01/26/2015, 10:51 AM

## 2015-01-26 NOTE — Care Management Note (Signed)
Case Management Note  Patient Details  Name: David Huynh MRN: [id number] Date of Birth: 10/19/1940  Subjective/Objective:         Admitted with SOB, hx of  hyperlipidemia, hypertension, DM and asthma who fell on right side about 2 weeks ago and had severe right chest wall pain for about a week.  He [facility] and CXR and CT chest shows right rib fractures and large right effusion/hemothorax with complete collapse of the lower and middle lobes and partial collapse of the upper lobe. S/P  VIDEO ASSISTED THORACOSCOPY (VATS) FOR DRAINAGE OF RIGHT HEMOTHORAX (Right)  01/24/15.  Action/Plan: Return to home when medically stable. CM to f/u with d/c disposition.  Expected Discharge Date:  01/25/15               Expected Discharge Plan:  Home/Self Care  In-House Referral:     Discharge planning Services  CM Consult  Post Acute Care Choice:    Choice offered to:     DME Arranged:    DME Agency:     HH Arranged:    HH Agency:     Status of Service:  In process, will continue to follow  Medicare Important Message Given:  Yes-second notification given Date Medicare IM Given:    Medicare IM give by:    Date Additional Medicare IM Given:    Additional Medicare Important Message give by:     If discussed at Long Length of Stay Meetings, dates discussed:    Additional Comments: BILLEY TOEWS (Spouse)  [phone]  Gae Gallop [place], [place] [phone] 01/26/2015, 3:19 PM

## 2015-01-26 NOTE — Progress Notes (Addendum)
      [address]       David Huyn[place] [address]             [phone]       2 Days Post-Op Procedure(s) (LRB): VIDEO ASSISTED THORACOSCOPY (VATS) FOR DRAINAGE OF RIGHT HEMOTHORAX (Right)  Subjective: Patient able to urinate small amounts. He was bladder scanned earlier and had 500 cc. He is not having much incisional pain.  Objective: Vital signs in last 24 hours: Temp:  [98.1 F (36.7 C)-98.4 F (36.9 C)] 98.1 F (36.7 C) (09/13 0400) Pulse Rate:  [98-128] 105 (09/13 0405) Cardiac Rhythm:  [-] Normal sinus rhythm (09/13 0405) Resp:  [14-31] 23 (09/13 0405) BP: (116-170)/(63-107) 162/107 mmHg (09/13 0405) SpO2:  [93 %-100 %] 95 % (09/13 0405) Arterial Line BP: (111)/(108) 111/108 mmHg (09/12 0900) Weight:  [266 lb 15.6 oz (121.1 kg)] 266 lb 15.6 oz (121.1 kg) (09/13 0500)     Intake/Output from previous day: 09/12 0701 - 09/13 0700 In: 1350 [P.O.:790; I.V.:60; IV Piggyback:500] Out: 2005 [Urine:1435; Chest Tube:570]   Physical Exam:  Cardiovascular: Tachycardic Pulmonary: Slightly diminished at bases; no rales, wheezes, or rhonchi. Abdomen: Soft, non tender, bowel sounds present. Extremities: SCDs in place Wounds: Clean and dry.  . Chest Tube: to water seal and no air leak  Lab Results: CBC: Recent Labs  01/25/15 0414 01/26/15 0400  WBC 12.4* 11.9*  HGB 12.0* 13.4  HCT 35.6* 40.4  PLT 290 345   BMET:  Recent Labs  01/25/15 0414 01/26/15 0400  NA 141 136  K 3.7 3.6  CL 108 101  CO2 27 25  GLUCOSE 89 169*  BUN 10 9  CREATININE 0.76 0.92  CALCIUM 8.5* 8.6*    PT/INR: No results for input(s): LABPROT, INR in the last 72 hours. ABG:  INR: Will add last result for INR, ABG once components are confirmed Will add last 4 CBG results once components are confirmed  Assessment/Plan:  1. CV - Slightly tachy and hypertensive. Will restart Norvasc and low dose Prinzide. 2.  Pulmonary - Chest tube with 570 cc last 24 hours. Chest tube is  to water seal and there is no air leak. Chest tube to remain for today.On room air. CXR this am shows small, trace right apical pneumothorax, bibasilar atelectasis, and multiple lower right rib fractures. Encourage incentive spirometer. 3. Urinary retention-started Flomax and re inserted foley. He has no prior history of prostate problems. 4. Supplement potassium 5.DM-CBGs 188/127/166. Continue Metformin. 6. Restart Crestor  ZIMMERMAN,DONIELLE MPA-C 01/26/2015,8:33 AM  Foley removed yesterday, had to have replaced , now on flomax,  likely remove chest tube in am I have seen and examined David Huynh and agree with the above assessment  and plan.  Delight Ovens MD Beeper [phone] Office [phone] 01/26/2015 5:26 PM

## 2015-01-27 ENCOUNTER — Inpatient Hospital Stay (HOSPITAL_COMMUNITY): Payer: Medicare Other

## 2015-01-27 LAB — GLUCOSE, CAPILLARY
GLUCOSE-CAPILLARY: 178 mg/dL — AB (ref 65–99)
GLUCOSE-CAPILLARY: 155 mg/dL — AB (ref 65–99)
Glucose-Capillary: 136 mg/dL — ABNORMAL HIGH (ref 65–99)
Glucose-Capillary: 213 mg/dL — ABNORMAL HIGH (ref 65–99)

## 2015-01-27 LAB — CULTURE, BODY FLUID-BOTTLE

## 2015-01-27 LAB — CULTURE, BODY FLUID W GRAM STAIN -BOTTLE

## 2015-01-27 MED ORDER — LISINOPRIL 10 MG PO TABS
20.0000 mg | ORAL_TABLET | Freq: Every day | ORAL | Status: DC
  Administered 2015-01-27 – 2015-01-29 (×3): 20 mg via ORAL
  Filled 2015-01-27: qty 1
  Filled 2015-01-27: qty 2
  Filled 2015-01-27: qty 1

## 2015-01-27 NOTE — Progress Notes (Addendum)
[address]       [name] [address]             [phone]          3 Days Post-Op Procedure(s) (LRB): VIDEO ASSISTED THORACOSCOPY (VATS) FOR DRAINAGE OF RIGHT HEMOTHORAX (Right)  Subjective: Comfortable, no complaints. Breathing much improved. Pain controlled.   Objective: Vital signs in last 24 hours: Patient Vitals for the past 24 hrs:  BP Temp Temp src Pulse Resp SpO2 Weight  01/27/15 0603 (!) 149/88 mmHg - - (!) 141 (!) 22 95 % -  01/27/15 0541 (!) 162/92 mmHg - - (!) 111 (!) 26 97 % -  01/27/15 0526 - 98 F (36.7 C) Oral - - - -  01/27/15 0500 - - - - - - 265 lb 10.5 oz (120.5 kg)  01/26/15 2323 136/74 mmHg - - 96 16 97 % -  01/26/15 2321 - 98.3 F (36.8 C) Oral - - - -  01/26/15 2009 - - - - - 97 % -  01/26/15 1953 133/67 mmHg - - 95 (!) 26 93 % -  01/26/15 1950 - 98.1 F (36.7 C) Oral - - - -  01/26/15 1600 - 98.4 F (36.9 C) Oral - - - -  01/26/15 1431 (!) 155/69 mmHg - - (!) 109 19 96 % -  01/26/15 1428 - - - - - 95 % -  01/26/15 1100 - 98.2 F (36.8 C) Oral - - - -  01/26/15 1039 (!) 160/76 mmHg - - - (!) 25 99 % -  01/26/15 0905 - - - - - 98 % -  01/26/15 0845 (!) 174/98 mmHg 98.1 F (36.7 C) Oral (!) 104 (!) 32 99 % -   Current Weight  01/27/15 265 lb 10.5 oz (120.5 kg)     Intake/Output from previous day: 09/13 0701 - 09/14 0700 In: 740 [P.O.:240; IV Piggyback:500] Out: 3540 [Urine:3000; Chest Tube:540]  CBGs 217-136  PHYSICAL EXAM:  Heart: RRR Lungs: Clear Wound: Dressed and dry Chest tube: No air leak    Lab Results: CBC: Recent Labs  01/25/15 0414 01/26/15 0400  WBC 12.4* 11.9*  HGB 12.0* 13.4  HCT 35.6* 40.4  PLT 290 345   BMET:  Recent Labs  01/25/15 0414 01/26/15 0400  NA 141 136  K 3.7 3.6  CL 108 101  CO2 27 25  GLUCOSE 89 169*  BUN 10 9  CREATININE 0.76 0.92  CALCIUM 8.5* 8.6*    PT/INR: No results for input(s): LABPROT, INR in the last 72 hours.  CXR: FINDINGS: Right IJ  line and right chest tube in stable position. Tiny stable right apical pneumothorax . Mediastinum and hilar structures normal. Cardiomegaly with normal pulmonary vascularity. Lungs are clear of acute infiltrates. No pleural effusion.  IMPRESSION: 1. Right IJ line and right chest tube in stable position. Tiny stable right apical pneumothorax. 2. Cardiomegaly with normal pulmonary vascularity. No focal infiltrate .   Assessment/Plan: S/P Procedure(s) (LRB): VIDEO ASSISTED THORACOSCOPY (VATS) FOR DRAINAGE OF RIGHT HEMOTHORAX (Right) CT output remains high but decreasing, 340->200 over past 2 shifts.  Drainage serous. No air leak and CXR stable. Continue CT for now. Urinary retention- Foley back in for > 24 hrs. Flomax started. Hopefully for voiding trial soon. HTN- BPs still elevated, continue Norvasc, will increase Lisinopril to home dose. DM- sugars ok, back on home meds. Continue ambulation, pulm toilet.   LOS: 4 days  [name],[name] 01/27/2015   Chart reviewed, patient examined, agree with above. His chest tube output is decreasing and tube should be able to come out in the am.  Remove foley in the am to see if he can void. He says that he has BPH but usually does not have any problem voiding at home.

## 2015-01-27 NOTE — Progress Notes (Signed)
UR COMPLETED  

## 2015-01-28 ENCOUNTER — Inpatient Hospital Stay (HOSPITAL_COMMUNITY): Payer: Medicare Other

## 2015-01-28 LAB — GLUCOSE, CAPILLARY
GLUCOSE-CAPILLARY: 124 mg/dL — AB (ref 65–99)
GLUCOSE-CAPILLARY: 203 mg/dL — AB (ref 65–99)
Glucose-Capillary: 180 mg/dL — ABNORMAL HIGH (ref 65–99)
Glucose-Capillary: 193 mg/dL — ABNORMAL HIGH (ref 65–99)

## 2015-01-28 MED ORDER — CIPROFLOXACIN HCL 500 MG PO TABS
500.0000 mg | ORAL_TABLET | Freq: Two times a day (BID) | ORAL | Status: DC
  Administered 2015-01-28 – 2015-01-29 (×3): 500 mg via ORAL
  Filled 2015-01-28 (×5): qty 1

## 2015-01-28 MED ORDER — METFORMIN HCL 500 MG PO TABS
1000.0000 mg | ORAL_TABLET | Freq: Two times a day (BID) | ORAL | Status: DC
  Administered 2015-01-28 – 2015-01-29 (×3): 1000 mg via ORAL
  Filled 2015-01-28 (×5): qty 2

## 2015-01-28 MED ORDER — INSULIN ASPART 100 UNIT/ML ~~LOC~~ SOLN
0.0000 [IU] | Freq: Three times a day (TID) | SUBCUTANEOUS | Status: DC
  Administered 2015-01-28 – 2015-01-29 (×2): 3 [IU] via SUBCUTANEOUS

## 2015-01-28 NOTE — Progress Notes (Addendum)
Report called to [place], RN on 2W. Patient to be transferred to 2W35 via wheelchair on tele. Patient's wife at bedside. Belongings sent with patient.

## 2015-01-28 NOTE — Discharge Summary (Signed)
[address]       [name] [address]             [phone]              Discharge Summary  Name: David Huynh DOB: 10-11-1940 74 y.o. MRN: [id number]   Admission Date: 01/23/2015 Discharge Date: 01/29/2015    Admitting Diagnosis: Right hemothorax Right rib fractures    Discharge Diagnosis:  Right hemothorax Right rib fractures Postoperative urinary retention  Past Medical History  Diagnosis Date  . Hyperlipidemia   . Obesity   . Allergy     seasonal  . Asthma     mild, intermittent  . Diabetes mellitus     type 2  . Hypertension   . Liver function study, abnormal 04/26/2011     Procedures: RIGHT VIDEO ASSISTED THORACOSCOPY  DRAINAGE OF RIGHT HEMOTHORAX  - 01/24/2015    HPI:  The patient is a 74 y.o. male with hyperlipidemia, hypertension, DM and asthma who fell on his right side about 2 weeks ago and had severe right chest wall pain for about a week. He had some wheezing and shortness of breath improved with an inhaler. Then about 5 days ago, he began having more shortness of breath that became severe on the date of admission. He presented to [facility] and chest x-ray and CT of the chest showed right rib fractures and a large right effusion/hemothorax with complete collapse of the lower and middle lobes and partial collapse of the upper lobe.  He was subsequently transferred to [facility] for thoracic surgery evaluation.     Hospital Course:  The patient was admitted to [facility] on 01/23/2015. Dr. Laneta Simmers saw the patient and reviewed his films. It was felt that he would require a right VATS for drainage of the hemothorax.   All risks, benefits and alternatives of surgery were explained in detail, and the patient agreed to proceed. The patient was taken to the operating room and underwent the above procedure.  Approximately 4 liters of bloody fluid was drained from the right chest. The patient tolerated the procedure  well and was transferred to the ICU for further management.  The postoperative course was notable for urinary retention following removal of his Foley catheter.  Another Foley was reinserted and the patient was started on Flomax. This was left in place until postop day 5, then was removed. Since that time, he has been voiding without difficulty.  Chest tube was managed conservatively and was weaned from suction to water seal.  It was ultimately discontinued on postop day 5.  Follow up chest x-rays have remained stable with no pneumothorax.    He has otherwise done well postoperatively. Incisions are healing well.  He is ambulating in the hall without difficulty and tolerating a regular diet.  Intraoperative fluid cultures were positive for coagulase negative staph and the patient was placed on oral Cipro.  I discussed the importance of patient obtaining an appointment as soon as possible with medical doctor. His HGA1C is 9.9, he has multiple medical problems, which include but not limited to, diabetes,hyperlipidemia and hypertension. He has not been compliant with his medications. The patient is overall medically stable on today's date for discharge home.  Latest Imaging Study: EXAM: CHEST 2 VIEW  COMPARISON: 01/28/2015.  FINDINGS: Interim removal of right chest tube. No residual pneumothorax. Mediastinum hilar structures normal. Stable cardiomegaly. Bibasilar subsegmental atelectasis. Small right pleural effusion. No  pneumothorax. Multiple right rib fractures again noted.  IMPRESSION: 1. Interim removal right chest tube. No pneumothorax. Multiple right rib fractures again noted. 2. Basilar subsegmental atelectasis. Small right pleural effusion.   Electronically Signed  By: Maisie Fus Register  On: 01/29/2015 08:11  Recent vital signs:  Filed Vitals:   01/29/15 0606  BP: 162/91  Pulse: 90  Temp: 98.2 F (36.8 C)  Resp: 18    Recent laboratory studies:  CBC:No results for  input(s): WBC, HGB, HCT, PLT in the last 72 hours. BMET: No results for input(s): NA, K, CL, CO2, GLUCOSE, BUN, CREATININE, CALCIUM in the last 72 hours.  PT/INR: No results for input(s): LABPROT, INR in the last 72 hours.   Discharge Medications:     Medication List    STOP taking these medications        colesevelam 625 MG tablet  Commonly known as:  WELCHOL      TAKE these medications        acetaminophen 500 MG tablet  Commonly known as:  TYLENOL  Take 500 mg by mouth 3 (three) times daily as needed (pain).     albuterol 108 (90 BASE) MCG/ACT inhaler  Commonly known as:  VENTOLIN HFA  Inhale 2 puffs into the lungs as needed.     amLODipine 5 MG tablet  Commonly known as:  NORVASC  Take 1 tablet (5 mg total) by mouth daily.     cetirizine 10 MG tablet  Commonly known as:  ZYRTEC  Take 10 mg by mouth at bedtime.     CINNAMON PO  Take 1 capsule by mouth at bedtime.     ciprofloxacin 500 MG tablet  Commonly known as:  CIPRO  Take 1 tablet (500 mg total) by mouth 2 (two) times daily. For 5 days then stop.     lisinopril-hydrochlorothiazide 20-12.5 MG per tablet  Commonly known as:  ZESTORETIC  Take 2 tablets by mouth daily.     metFORMIN 1000 MG tablet  Commonly known as:  GLUCOPHAGE  Take 1 tablet (1,000 mg total) by mouth 2 (two) times daily with a meal.     multivitamin with minerals Tabs tablet  Take 1 tablet by mouth at bedtime.     rosuvastatin 40 MG tablet  Commonly known as:  CRESTOR  Take 1 tablet (40 mg total) by mouth daily at 6 PM.     tamsulosin 0.4 MG Caps capsule  Commonly known as:  FLOMAX  Take 1 capsule (0.4 mg total) by mouth daily. For one month then stop.     traMADol 50 MG tablet  Commonly known as:  ULTRAM  Take 1 tablet (50 mg total) by mouth every 4 (four) hours as needed for moderate pain.     TURMERIC PO  Take 1 capsule by mouth at bedtime.         Discharge Instructions:  The patient is to refrain from driving, heavy  lifting or strenuous activity.  May shower daily and clean incisions with soap and water.  May resume regular diet.   Follow Up: Follow-up Information    Follow up with Alleen Borne, MD On 02/24/2015.   Specialty:  Cardiothoracic Surgery   Why:  PA/LAT CXR to be taken at [facility] (which is in the same building as Dr. Sharee Pimple office) on 02/24/2015 at 11:45 am;Appointment time is at 12:30 pm   Contact information:   [address] [place] [address] [phone]  Follow up with T[facility] RN On 02/04/2015.   Why:  For chest tube suture removal at 10:30      Follow up with Danise Edge, MD.   Specialty:  Family Medicine   Why:  Please make an appointment as soon as possible for medication adjustments and follow up of hypertension, diabetes (HGA1C 9.9), hyperlipidemia   Contact information:   [address] [place] [address] [phone]       Ardelle Balls PA-C 01/29/2015, 8:50 AM

## 2015-01-28 NOTE — Progress Notes (Addendum)
4 Days Post-Op Procedure(s) (LRB): VIDEO ASSISTED THORACOSCOPY (VATS) FOR DRAINAGE OF RIGHT HEMOTHORAX (Right) Subjective:  No complaints. Slept well.  Objective: Vital signs in last 24 hours: Temp:  [97.7 F (36.5 C)-98.3 F (36.8 C)] 98 F (36.7 C) (09/15 0320) Pulse Rate:  [92-99] 92 (09/15 0320) Cardiac Rhythm:  [-] Normal sinus rhythm (09/15 0320) Resp:  [15-27] 24 (09/15 0320) BP: (134-163)/(71-90) 143/80 mmHg (09/15 0320) SpO2:  [95 %-99 %] 98 % (09/15 0320) Weight:  [119.9 kg (264 lb 5.3 oz)] 119.9 kg (264 lb 5.3 oz) (09/15 0320)  Hemodynamic parameters for last 24 hours:    Intake/Output from previous day: 09/14 0701 - 09/15 0700 In: 1220 [P.O.:970; IV Piggyback:250] Out: 3375 [Urine:3325; Chest Tube:50] Intake/Output this shift:    General appearance: alert and cooperative Heart: regular rate and rhythm, S1, S2 normal, no murmur, click, rub or gallop Lungs: clear to auscultation bilaterally Chest tube output low, serous  Lab Results:  Recent Labs  01/26/15 0400  WBC 11.9*  HGB 13.4  HCT 40.4  PLT 345   BMET:  Recent Labs  01/26/15 0400  NA 136  K 3.6  CL 101  CO2 25  GLUCOSE 169*  BUN 9  CREATININE 0.92  CALCIUM 8.6*    PT/INR: No results for input(s): LABPROT, INR in the last 72 hours. ABG    Component Value Date/Time   PHART 7.434 01/25/2015 0350   HCO3 27.5* 01/25/2015 0350   TCO2 29 01/25/2015 0350   ACIDBASEDEF 1.0 01/24/2015 0826   O2SAT 96.0 01/25/2015 0350   CBG (last 3)   Recent Labs  01/27/15 1243 01/27/15 1621 01/27/15 2140  GLUCAP 178* 155* 213*   CXR: ok  Assessment/Plan: S/P Procedure(s) (LRB): VIDEO ASSISTED THORACOSCOPY (VATS) FOR DRAINAGE OF RIGHT HEMOTHORAX (Right)  Doing well  Remove chest tube, central line  Foley is out this am. Continue Flomax and see if he can void today.  Pleural fluid culture from the OR grew Coag neg staph which is surprising since it did not look grossly infected. Will switch  to cipro for another week to cover this.  Transfer to 2W. Possibly home tomorrow if he voids ok.   LOS: 5 days    Alleen Borne 01/28/2015

## 2015-01-29 ENCOUNTER — Inpatient Hospital Stay (HOSPITAL_COMMUNITY): Payer: Medicare Other

## 2015-01-29 LAB — GLUCOSE, CAPILLARY: Glucose-Capillary: 157 mg/dL — ABNORMAL HIGH (ref 65–99)

## 2015-01-29 MED ORDER — AMLODIPINE BESYLATE 5 MG PO TABS
5.0000 mg | ORAL_TABLET | Freq: Every day | ORAL | Status: DC
Start: 2015-01-29 — End: 2015-03-29

## 2015-01-29 MED ORDER — LISINOPRIL-HYDROCHLOROTHIAZIDE 20-12.5 MG PO TABS: 2.0000 | ORAL_TABLET | Freq: Every day | ORAL | Status: DC

## 2015-01-29 MED ORDER — METFORMIN HCL 1000 MG PO TABS: 1000.0000 mg | ORAL_TABLET | Freq: Two times a day (BID) | ORAL | Status: DC

## 2015-01-29 MED ORDER — CIPROFLOXACIN HCL 500 MG PO TABS: 500.0000 mg | ORAL_TABLET | Freq: Two times a day (BID) | ORAL | Status: DC

## 2015-01-29 MED ORDER — TRAMADOL HCL 50 MG PO TABS: 50.0000 mg | ORAL_TABLET | ORAL | Status: DC | PRN

## 2015-01-29 MED ORDER — TAMSULOSIN HCL 0.4 MG PO CAPS: 0.4000 mg | ORAL_CAPSULE | Freq: Every day | ORAL | Status: DC

## 2015-01-29 MED ORDER — ROSUVASTATIN CALCIUM 40 MG PO TABS: 40.0000 mg | ORAL_TABLET | Freq: Every day | ORAL | Status: DC

## 2015-01-29 NOTE — Care Management Important Message (Signed)
Important Message  Patient Details  Name: David Huynh MRN: [id number] Date of Birth: 1941/02/17   Medicare Important Message Given:  Yes-third notification given    Kyla Balzarine 01/29/2015, 11:27 AM

## 2015-01-29 NOTE — Progress Notes (Addendum)
      [address]       [name] [address]             [phone]       5 Days Post-Op Procedure(s) (LRB): VIDEO ASSISTED THORACOSCOPY (VATS) FOR DRAINAGE OF RIGHT HEMOTHORAX (Right)  Subjective: Patient without complaints and hopes to go home.  Objective: Vital signs in last 24 hours: Temp:  [97.6 F (36.4 C)-98.2 F (36.8 C)] 98.2 F (36.8 C) (09/16 0606) Pulse Rate:  [88-107] 90 (09/16 0606) Cardiac Rhythm:  [-] Normal sinus rhythm (09/15 1924) Resp:  [16-20] 18 (09/16 0606) BP: (128-165)/(76-91) 162/91 mmHg (09/16 0606) SpO2:  [95 %-100 %] 98 % (09/16 0606)     Intake/Output from previous day: 09/15 0701 - 09/16 0700 In: 840 [P.O.:840] Out: 1000 [Urine:950; Chest Tube:50]   Physical Exam:  Cardiovascular: Slightly tachycardic Pulmonary: Slightly diminished at right base; no rales, wheezes, or rhonchi. Abdomen: Soft, non tender, bowel sounds present. Extremities: Trace LE edema Wounds: Clean and dry.  . C  Lab Results: CBC:No results for input(s): WBC, HGB, HCT, PLT in the last 72 hours. BMET: No results for input(s): NA, K, CL, CO2, GLUCOSE, BUN, CREATININE, CALCIUM in the last 72 hours.  PT/INR: No results for input(s): LABPROT, INR in the last 72 hours. ABG:  INR: Will add last result for INR, ABG once components are confirmed Will add last 4 CBG results once components are confirmed  Assessment/Plan:  1. CV - Slightly tachy and hypertensive this am. On Norvasc 5 mg daily, HCTZ 12.5 mg daily, and Lisinopril 20 mg daily. Will resume full dose of Zestoretic at discharge. 2.  Pulmonary - On room air. CXR this am shows  trace right apical pneumothorax, bibasilar atelectasis, small right pleural effusion,and multiple lower right rib fractures. Encourage incentive spirometer. 3. Urinary retention-has been on Flomax. Foley removed yesterday. Able to void without difficulties 4.DM-CBGs 193/203/157. Continue Metformin. 5.ID-on Cipro as Staph  (coag negative) in right pleural fluid. Will continue for one week. 6. Discharge this am  ZIMMERMAN,DONIELLE MPA-C 01/29/2015,8:06 AM    Chart reviewed, patient examined, agree with above. He feels well and CXR looks ok. He is passing urine without difficulty. Plan to send home today.

## 2015-01-29 NOTE — Progress Notes (Signed)
01/29/2015 11:09 AM Nursing note D/c avs form, medications already taken today and those due this evening given and explained to patient. Follow up appointments and when to call MD reviewed. RX reviewed. D/c iv. D/c tele. D/c home per orders.  Flippin, Blanchard Kelch

## 2015-01-29 NOTE — Discharge Instructions (Signed)
ACTIVITY:  1.Increase activity slowly. 2.Walk daily and increase frequency and duration as tolerates. 3.May walk up steps. 4.No lifting more than ten pounds for two weeks. 5.No driving for two weeks. 6.Avoid straining. 7.STOP any activity that causes chest pain, shortness of breath, dizziness,sweating,     or excessive weakness. 8.Continue with breathing exercises daily.  DIET:  Diabetic diet and Low fat, Low salt diet   WOUND:  1.May shower. 2.Clean wounds with mild soap and water.  Call the office at [phone] if any problems arise.  Thoracoscopy Care After Refer to this sheet in the next few weeks. These discharge instructions provide you with general information on caring for yourself after you leave the hospital. Your caregiver may also give you specific instructions. Your treatment has been planned according to the most current medical practices available, but unavoidable complications sometimes occur. If you have any problems or questions after discharge, call your caregiver. HOME CARE INSTRUCTIONS   Remove the bandage (dressing) over your chest tube site as directed by your caregiver.  It is normal to be sore for a couple weeks following surgery. See your caregiver if this seems to be getting worse rather than better.  Only take over-the-counter or prescription medicines for pain, discomfort, or fever as directed by your caregiver. It is very important to take pain medicine when you need it so that you will cough and breathe deeply enough to clear mucus (phlegm) and expand your lungs.  If it hurts to cough, hold a pillow against your chest when you cough. This may help with the discomfort. In spite of the discomfort, cough frequently, as this helps protect against getting an infection in your lung (pneumonia).  Taking deep breaths keeps lungs inflated and protects against pneumonia. Most patients will go home with an incentive spirometer that encourages deep  breathing.  You may resume a normal diet and activities as directed.  Use showers for bathing until you see your caregiver, or as instructed.  Change dressings if necessary or as directed.  Avoid lifting or driving until you are instructed otherwise.  Make an appointment to see your caregiver for stitch (suture) or staple removal when instructed.  Do not travel by airplane for 2 weeks after the chest tube is removed. SEEK MEDICAL CARE IF:   You are bleeding from your wounds.  You have redness, swelling, or increasing pain in the wounds.  Your heartbeat feels irregular or very fast.  There is pus coming from your wounds.  There is a bad smell coming from the wound or dressing. SEEK IMMEDIATE MEDICAL CARE IF:   You have a fever.  You develop a rash.  You have difficulty breathing.  You develop any reaction or side effects to medicines given.  You develop lightheadedness or feel faint.  You develop shortness of breath or chest pain. MAKE SURE YOU:   Understand these instructions.  Will watch your condition.  Will get help right away if you are not doing well or get worse. Document Released: 11/18/2004 Document Revised: 07/24/2011 Document Reviewed: 10/19/2010 [facility] Patient Information 2015 [place], [place]. This information is not intended to replace advice given to you by your health care provider. Make sure you discuss any questions you have with your health care provider.

## 2015-02-02 ENCOUNTER — Telehealth: Payer: Self-pay

## 2015-02-02 NOTE — Telephone Encounter (Signed)
Admission Date: 01/23/2015 Discharge Date: 01/29/2015  Reason for admission: Right hemothorax and Right rib fractures  Recommendation for PCP: Follow up with Danise Edge, MD.    Specialty: Family Medicine   Why: Please make an appointment as soon as possible for medication adjustments and follow up of hypertension, diabetes (HGA1C 9.9), hyperlipidemia         Hospital follow up appointment scheduled with Dr. Abner Greenspan on 02/11/14 @ 11:15 am.      Transition Care Management Follow-up Telephone Call  How have you been since you were released from the hospital? Pt states he is improving.  He feels tired, but is to be expected given recent events.  He denies chest pain/tightness, shortness of breath, wheezing, fever, and persistent coughing.  States he has been using incentive spirometer without difficulty.  Incision intact.  States blood sugars have been stabled.       Do you understand why you were in the hospital? yes   Do you understand the discharge instrcutions? yes  Items Reviewed:  Medications reviewed: yes  Allergies reviewed: yes  Dietary changes reviewed: yes  Referrals reviewed: yes, Dr. Laneta Simmers and TCTs Nurse (suture removal)-appointments scheduled.     Functional Questionnaire:   Activities of Daily Living (ADLs):   He states they are independent in the following: ambulation, bathing and hygiene, feeding, continence, grooming, toileting and dressing States they require assistance with the following: n/a   Any transportation issues/concerns?: no   Any patient concerns? no   Confirmed importance and date/time of follow-up visits scheduled: yes   Confirmed with patient if condition begins to worsen call PCP or go to the ER: yes, he stated understanding and agreed.

## 2015-02-02 NOTE — Telephone Encounter (Signed)
Admission Date: 01/23/2015 Discharge Date: 01/29/2015  Reason for admission: Right hemothorax and Right rib fractures   Left a message for call back.

## 2015-02-04 ENCOUNTER — Ambulatory Visit (INDEPENDENT_AMBULATORY_CARE_PROVIDER_SITE_OTHER): Payer: Self-pay | Admitting: *Deleted

## 2015-02-04 DIAGNOSIS — J942 Hemothorax: Secondary | ICD-10-CM

## 2015-02-04 DIAGNOSIS — J9589 Other postprocedural complications and disorders of respiratory system, not elsewhere classified: Principal | ICD-10-CM

## 2015-02-04 DIAGNOSIS — Z4802 Encounter for removal of sutures: Secondary | ICD-10-CM

## 2015-02-05 NOTE — Progress Notes (Signed)
Mr. Theune returns for one suture removal of a previous chest tube site s/p R VATS removal of a hemothorax after a previous fall /fx ribs.  All sites have healed well. He is finishing his antibiotics today.  He will return as scheduled with a chest xray.

## 2015-02-12 ENCOUNTER — Ambulatory Visit (INDEPENDENT_AMBULATORY_CARE_PROVIDER_SITE_OTHER): Payer: Medicare Other | Admitting: Family Medicine

## 2015-02-12 ENCOUNTER — Encounter: Payer: Self-pay | Admitting: Family Medicine

## 2015-02-12 VITALS — BP 130/80 | HR 105 | Temp 98.0°F | Ht 72.0 in | Wt 257.4 lb

## 2015-02-12 DIAGNOSIS — I10 Essential (primary) hypertension: Secondary | ICD-10-CM

## 2015-02-12 DIAGNOSIS — E1165 Type 2 diabetes mellitus with hyperglycemia: Secondary | ICD-10-CM

## 2015-02-12 DIAGNOSIS — E559 Vitamin D deficiency, unspecified: Secondary | ICD-10-CM | POA: Diagnosis not present

## 2015-02-12 DIAGNOSIS — S2241XS Multiple fractures of ribs, right side, sequela: Secondary | ICD-10-CM

## 2015-02-12 DIAGNOSIS — J942 Hemothorax: Secondary | ICD-10-CM

## 2015-02-12 DIAGNOSIS — E785 Hyperlipidemia, unspecified: Secondary | ICD-10-CM

## 2015-02-12 DIAGNOSIS — S2249XA Multiple fractures of ribs, unspecified side, initial encounter for closed fracture: Secondary | ICD-10-CM

## 2015-02-12 DIAGNOSIS — E669 Obesity, unspecified: Secondary | ICD-10-CM

## 2015-02-12 DIAGNOSIS — Z23 Encounter for immunization: Secondary | ICD-10-CM

## 2015-02-12 HISTORY — DX: Hypocalcemia: E83.51

## 2015-02-12 HISTORY — DX: Multiple fractures of ribs, unspecified side, initial encounter for closed fracture: S22.49XA

## 2015-02-12 LAB — COMPREHENSIVE METABOLIC PANEL
ALT: 20 U/L (ref 0–53)
AST: 17 U/L (ref 0–37)
Albumin: 4.2 g/dL (ref 3.5–5.2)
Alkaline Phosphatase: 51 U/L (ref 39–117)
BILIRUBIN TOTAL: 0.4 mg/dL (ref 0.2–1.2)
BUN: 23 mg/dL (ref 6–23)
CO2: 30 meq/L (ref 19–32)
Calcium: 9.7 mg/dL (ref 8.4–10.5)
Chloride: 99 mEq/L (ref 96–112)
Creatinine, Ser: 0.95 mg/dL (ref 0.40–1.50)
GFR: 82.46 mL/min (ref >60.00)
GLUCOSE: 161 mg/dL — AB (ref 70–99)
Potassium: 3.6 mEq/L (ref 3.5–5.1)
SODIUM: 138 meq/L (ref 135–145)
Total Protein: 7.4 g/dL (ref 6.0–8.3)

## 2015-02-12 LAB — LIPID PANEL
CHOL/HDL RATIO: 3
Cholesterol: 132 mg/dL (ref 0–200)
HDL: 46.9 mg/dL (ref >39.00)
LDL Cholesterol: 49 mg/dL (ref 0–99)
NONHDL: 85.07
Triglycerides: 181 mg/dL — ABNORMAL HIGH (ref 0.0–149.0)
VLDL: 36.2 mg/dL (ref 0.0–40.0)

## 2015-02-12 LAB — CBC
HCT: 42.3 % (ref 39.0–52.0)
HEMOGLOBIN: 14.1 g/dL (ref 13.0–17.0)
MCHC: 33.4 g/dL (ref 30.0–36.0)
MCV: 91.3 fl (ref 78.0–100.0)
Platelets: 186 10*3/uL (ref 150.0–400.0)
RBC: 4.63 Mil/uL (ref 4.22–5.81)
RDW: 11.8 % (ref 11.5–15.5)
WBC: 10.4 10*3/uL (ref 4.0–10.5)

## 2015-02-12 LAB — VITAMIN D 25 HYDROXY (VIT D DEFICIENCY, FRACTURES): VITD: 12.78 ng/mL — ABNORMAL LOW (ref 30.00–100.00)

## 2015-02-12 LAB — TSH: TSH: 0.85 u[IU]/mL (ref 0.35–4.50)

## 2015-02-12 MED ORDER — ALBUTEROL SULFATE HFA 108 (90 BASE) MCG/ACT IN AERS: 2.0000 | INHALATION_SPRAY | RESPIRATORY_TRACT | Status: DC | PRN

## 2015-02-12 NOTE — Progress Notes (Signed)
Patient ID: OVEL David Huynh, male   DOB: [birth year], 74 y.o.   MRN: [id number]   Subjective:    Patient ID: David Huynh, male    DOB: [birth year], 74 y.o.   MRN: [id number]  Chief Complaint  Patient presents with  . Hospitalization Follow-up    HPI Patient is in today for hospital follow-up. He was in the hospital for nearly a week secondary to a fall resulting in rib fractures and hemothorax. Feels well today. He reports improvement in his shortness of breath, chest pain and fatigue since returning from hospital. His blood sugars have been averaging between 06/04/1938 since he got home but he was having sugars in the 60s 200s while in the hospital. He was given insulin in the hospital. He denies any other recent illness. He has not been in to see [place] in several years. Denies palp/SOB/HA/congestion/fevers/GI or GU c/o. Taking meds as prescribed  Past Medical History  Diagnosis Date  . Hyperlipidemia   . Obesity   . Allergy     seasonal  . Asthma     mild, intermittent  . Diabetes mellitus     type 2  . Hypertension   . Liver function study, abnormal 04/26/2011  . Multiple rib fractures     Past Surgical History  Procedure Laterality Date  . Appendectomy  1975  . Tonsillectomy and adenoidectomy  1947  . Cataract extraction  2010    b/l  . Retinal laser procedure  1985  . Video assisted thoracoscopy (vats)/thorocotomy Right 01/24/2015    Procedure: VIDEO ASSISTED THORACOSCOPY (VATS) FOR DRAINAGE OF RIGHT HEMOTHORAX;  Surgeon: Alleen Borne, MD;  Location: [facility] OR;  Service: Thoracic;  Laterality: Right;    History reviewed. No pertinent family history.  Social History   Social History  . Marital Status: Married    Spouse Name: N/A  . Number of Children: N/A  . Years of Education: N/A   Occupational History  . Not on file.   Social History Main Topics  . Smoking status: Never Smoker   . Smokeless tobacco: Never Used  . Alcohol Use: Yes     Comment: wine on  weekends w/dinner  . Drug Use: No  . Sexual Activity:    Partners: Female   Other Topics Concern  . Not on file   Social History Narrative    Outpatient Prescriptions Prior to Visit  Medication Sig Dispense Refill  . acetaminophen (TYLENOL) 500 MG tablet Take 500 mg by mouth 3 (three) times daily as needed (pain).    Marland Kitchen amLODipine (NORVASC) 5 MG tablet Take 1 tablet (5 mg total) by mouth daily. 30 tablet 1  . BIOTIN PO Take 1 capsule by mouth daily.    . cetirizine (ZYRTEC) 10 MG tablet Take 10 mg by mouth at bedtime.     Marland Kitchen CINNAMON PO Take 1 capsule by mouth at bedtime.     Marland Kitchen lisinopril-hydrochlorothiazide (ZESTORETIC) 20-12.5 MG per tablet Take 2 tablets by mouth daily. 90 tablet 1  . metFORMIN (GLUCOPHAGE) 1000 MG tablet Take 1 tablet (1,000 mg total) by mouth 2 (two) times daily with a meal. 90 tablet 1  . Multiple Vitamin (MULTIVITAMIN WITH MINERALS) TABS tablet Take 1 tablet by mouth at bedtime.     . rosuvastatin (CRESTOR) 40 MG tablet Take 1 tablet (40 mg total) by mouth daily at 6 PM. 30 tablet 1  . tamsulosin (FLOMAX) 0.4 MG CAPS capsule Take 1 capsule (0.4 mg total) by mouth daily. For  one month then stop. 30 capsule 0  . traMADol (ULTRAM) 50 MG tablet Take 1 tablet (50 mg total) by mouth every 4 (four) hours as needed for moderate pain. 30 tablet 0  . TURMERIC PO Take 1 capsule by mouth at bedtime.     Marland Kitchen albuterol (VENTOLIN HFA) 108 (90 BASE) MCG/ACT inhaler Inhale 2 puffs into the lungs as needed. 1 Inhaler 0  . ciprofloxacin (CIPRO) 500 MG tablet Take 1 tablet (500 mg total) by mouth 2 (two) times daily. For 5 days then stop. 11 tablet 0   No facility-administered medications prior to visit.    No Known Allergies  Review of Systems  Constitutional: Negative for fever and malaise/fatigue.  HENT: Negative for congestion.   Eyes: Negative for discharge.  Respiratory: Negative for shortness of breath.   Cardiovascular: Positive for chest pain. Negative for palpitations  and leg swelling.  Gastrointestinal: Negative for nausea and abdominal pain.  Genitourinary: Negative for dysuria.  Musculoskeletal: Negative for falls.  Skin: Negative for rash.  Neurological: Negative for loss of consciousness and headaches.  Endo/Heme/Allergies: Negative for environmental allergies.  Psychiatric/Behavioral: Negative for depression. The patient is not nervous/anxious.        Objective:    Physical Exam  Constitutional: He is oriented to person, place, and time. He appears well-developed and well-nourished. No distress.  HENT:  Head: Normocephalic and atraumatic.  Eyes: Conjunctivae are normal.  Neck: Neck supple. No thyromegaly present.  Cardiovascular: Normal rate and regular rhythm.  Exam reveals no gallop.   Murmur heard. Pulmonary/Chest: Effort normal and breath sounds normal. No respiratory distress. He has no wheezes.  Abdominal: Soft. Bowel sounds are normal. He exhibits no mass. There is no tenderness.  Musculoskeletal: He exhibits no edema.  Lymphadenopathy:    He has no cervical adenopathy.  Neurological: He is alert and oriented to person, place, and time.  Skin: Skin is warm and dry.  Psychiatric: He has a normal mood and affect. His behavior is normal.  Nursing note and vitals reviewed.   BP 130/80 mmHg  Pulse 105  Temp(Src) 98 F (36.7 C) (Oral)  Ht 6' (1.829 m)  Wt 257 lb 6 oz (116.745 kg)  BMI 34.90 kg/m2  SpO2 97% Wt Readings from Last 3 Encounters:  02/12/15 257 lb 6 oz (116.745 kg)  01/28/15 264 lb 5.3 oz (119.9 kg)  04/26/11 288 lb (130.636 kg)     Lab Results  Component Value Date   WBC 11.9* 01/26/2015   HGB 13.4 01/26/2015   HCT 40.4 01/26/2015   PLT 345 01/26/2015   GLUCOSE 169* 01/26/2015   CHOL 299* 04/17/2011   TRIG 374.0* 04/17/2011   HDL 55.20 04/17/2011   LDLDIRECT 199.5 04/17/2011   ALT 130* 04/17/2011   AST 85* 04/17/2011   NA 136 01/26/2015   K 3.6 01/26/2015   CL 101 01/26/2015   CREATININE 0.92  01/26/2015   BUN 9 01/26/2015   CO2 25 01/26/2015   TSH 0.61 04/17/2011   HGBA1C 9.9* 01/24/2015   MICROALBUR 17.6* 04/17/2011    Lab Results  Component Value Date   TSH 0.61 04/17/2011   Lab Results  Component Value Date   WBC 11.9* 01/26/2015   HGB 13.4 01/26/2015   HCT 40.4 01/26/2015   MCV 92.7 01/26/2015   PLT 345 01/26/2015   Lab Results  Component Value Date   NA 136 01/26/2015   K 3.6 01/26/2015   CO2 25 01/26/2015   GLUCOSE 169* 01/26/2015  BUN 9 01/26/2015   CREATININE 0.92 01/26/2015   BILITOT 1.1 04/17/2011   ALKPHOS 37* 04/17/2011   AST 85* 04/17/2011   ALT 130* 04/17/2011   PROT 7.5 04/17/2011   ALBUMIN 4.5 04/17/2011   ALBUMIN 4.5 04/17/2011   CALCIUM 8.6* 01/26/2015   ANIONGAP 10 01/26/2015   GFR 89.88 04/17/2011   Lab Results  Component Value Date   CHOL 299* 04/17/2011   Lab Results  Component Value Date   HDL 55.20 04/17/2011   No results found for: [facility] Lab Results  Component Value Date   TRIG 374.0* 04/17/2011   Lab Results  Component Value Date   CHOLHDL 5 04/17/2011   Lab Results  Component Value Date   HGBA1C 9.9* 01/24/2015       Assessment & Plan:   Problem List Items Addressed This Visit      Cardiovascular and Mediastinum   Hypertension - Primary (Chronic)    Well controlled, no changes to meds. Encouraged heart healthy diet such as the DASH diet and exercise as tolerated.          I have discontinued Mr. Keeven ciprofloxacin. I am also having him maintain his cetirizine, multivitamin with minerals, CINNAMON PO, TURMERIC PO, acetaminophen, amLODipine, tamsulosin, rosuvastatin, metFORMIN, lisinopril-hydrochlorothiazide, traMADol, BIOTIN PO, and albuterol.  Meds ordered this encounter  Medications  . albuterol (VENTOLIN HFA) 108 (90 BASE) MCG/ACT inhaler    Sig: Inhale 2 puffs into the lungs as needed.    Dispense:  1 Inhaler    Refill:  6     Baird Kay, LPN

## 2015-02-12 NOTE — Assessment & Plan Note (Signed)
Tolerating statin, encouraged heart healthy diet, avoid trans fats, minimize simple carbs and saturated fats. Increase exercise as tolerated. Check today

## 2015-02-12 NOTE — Assessment & Plan Note (Signed)
After fall with rib fracture, accumulated over a week. Feels much better after drainage in hospital has follow up with Dr Laneta Simmers in near future for surveillance will seek care if symptoms return

## 2015-02-12 NOTE — Patient Instructions (Addendum)
Vitamin D 2,000-5,000 IU over the counter daily NOW company brand.   Basic Carbohydrate Counting for Diabetes Mellitus Carbohydrate counting is a method for keeping track of the amount of carbohydrates you eat. Eating carbohydrates naturally increases the level of sugar (glucose) in your blood, so it is important for you to know the amount that is okay for you to have in every meal. Carbohydrate counting helps keep the level of glucose in your blood within normal limits. The amount of carbohydrates allowed is different for every person. A dietitian can help you calculate the amount that is right for you. Once you know the amount of carbohydrates you can have, you can count the carbohydrates in the foods you want to eat. Carbohydrates are found in the following foods:  Grains, such as breads and cereals.  Dried beans and soy products.  Starchy vegetables, such as potatoes, peas, and corn.  Fruit and fruit juices.  Milk and yogurt.  Sweets and snack foods, such as cake, cookies, candy, chips, soft drinks, and fruit drinks. CARBOHYDRATE COUNTING There are two ways to count the carbohydrates in your food. You can use either of the methods or a combination of both. Reading the "Nutrition Facts" on Packaged Food The "Nutrition Facts" is an area that is included on the labels of almost all packaged food and beverages in the [place]. It includes the serving size of that food or beverage and information about the nutrients in each serving of the food, including the grams (g) of carbohydrate per serving.  Decide the number of servings of this food or beverage that you will be able to eat or drink. Multiply that number of servings by the number of grams of carbohydrate that is listed on the label for that serving. The total will be the amount of carbohydrates you will be having when you eat or drink this food or beverage. Learning Standard Serving Sizes of Food When you eat food that is not  packaged or does not include "Nutrition Facts" on the label, you need to measure the servings in order to count the amount of carbohydrates.A serving of most carbohydrate-rich foods contains about 15 g of carbohydrates. The following list includes serving sizes of carbohydrate-rich foods that provide 15 g ofcarbohydrate per serving:   1 slice of bread (1 oz) or 1 six-inch tortilla.    of a hamburger bun or English muffin.  4-6 crackers.   cup unsweetened dry cereal.    cup hot cereal.   cup rice or pasta.    cup mashed potatoes or  of a large baked potato.  1 cup fresh fruit or one small piece of fruit.    cup canned or frozen fruit or fruit juice.  1 cup milk.   cup plain fat-free yogurt or yogurt sweetened with artificial sweeteners.   cup cooked dried beans or starchy vegetable, such as peas, corn, or potatoes.  Decide the number of standard-size servings that you will eat. Multiply that number of servings by 15 (the grams of carbohydrates in that serving). For example, if you eat 2 cups of strawberries, you will have eaten 2 servings and 30 g of carbohydrates (2 servings x 15 g = 30 g). For foods such as soups and casseroles, in which more than one food is mixed in, you will need to count the carbohydrates in each food that is included. EXAMPLE OF CARBOHYDRATE COUNTING Sample Dinner  3 oz chicken breast.   cup of brown rice.  cup of corn.  1 cup milk.   1 cup strawberries with sugar-free whipped topping.  Carbohydrate Calculation Step 1: Identify the foods that contain carbohydrates:   Rice.   Corn.   Milk.   Strawberries. Step 2:Calculate the number of servings eaten of each:   2 servings of rice.   1 serving of corn.   1 serving of milk.   1 serving of strawberries. Step 3: Multiply each of those number of servings by 15 g:   2 servings of rice x 15 g = 30 g.   1 serving of corn x 15 g = 15 g.   1 serving of milk x 15  g = 15 g.   1 serving of strawberries x 15 g = 15 g. Step 4: Add together all of the amounts to find the total grams of carbohydrates eaten: 30 g + 15 g + 15 g + 15 g = 75 g. Document Released: 05/01/2005 Document Revised: 09/15/2013 Document Reviewed: 03/28/2013 [facility] Patient Information 2015 [place], [place]. This information is not intended to replace advice given to you by your health care provider. Make sure you discuss any questions you have with your health care provider.

## 2015-02-12 NOTE — Assessment & Plan Note (Signed)
Encouraged to start Vitamin D 2000 IU daily, recheck level today with Vitamin D level check

## 2015-02-12 NOTE — Assessment & Plan Note (Signed)
hgba1c unacceptable, minimize simple carbs. Increase exercise as tolerated. Continue current meds, was started on Insulin in hospital then switched to Metformin on discharge, sugars ranging from 120 to 140s at home. No changes for now

## 2015-02-12 NOTE — Progress Notes (Signed)
Pre visit review using our clinic review tool, if applicable. No additional management support is needed unless otherwise documented below in the visit note. 

## 2015-02-12 NOTE — Assessment & Plan Note (Signed)
Well controlled, no changes to meds. Encouraged heart healthy diet such as the DASH diet and exercise as tolerated.  °

## 2015-02-12 NOTE — Assessment & Plan Note (Signed)
Encouraged DASH diet, decrease po intake and increase exercise as tolerated. Needs 7-8 hours of sleep nightly. Avoid trans fats, eat small, frequent meals every 4-5 hours with lean proteins, complex carbs and healthy fats. Minimize simple carbs, GMO foods. 

## 2015-02-12 NOTE — Assessment & Plan Note (Signed)
From fall with minimal impact, proceed with Dexa scan and vitamin D check

## 2015-02-19 ENCOUNTER — Other Ambulatory Visit: Payer: Self-pay | Admitting: Surgery

## 2015-02-19 DIAGNOSIS — J942 Hemothorax: Secondary | ICD-10-CM

## 2015-02-24 ENCOUNTER — Encounter: Payer: Self-pay | Admitting: Surgery

## 2015-02-24 ENCOUNTER — Ambulatory Visit (INDEPENDENT_AMBULATORY_CARE_PROVIDER_SITE_OTHER): Payer: Self-pay | Admitting: Surgery

## 2015-02-24 ENCOUNTER — Ambulatory Visit
Admission: RE | Admit: 2015-02-24 | Discharge: 2015-02-24 | Disposition: A | Discharge status: Home / Self Care | Payer: Medicare Other | Source: Ambulatory Visit | Attending: Surgery | Admitting: Surgery

## 2015-02-24 VITALS — BP 153/89 | HR 109 | Resp 20 | Ht 72.0 in | Wt 257.0 lb

## 2015-02-24 DIAGNOSIS — Z09 Encounter for follow-up examination after completed treatment for conditions other than malignant neoplasm: Secondary | ICD-10-CM

## 2015-02-24 DIAGNOSIS — J90 Pleural effusion, not elsewhere classified: Secondary | ICD-10-CM | POA: Diagnosis not present

## 2015-02-24 DIAGNOSIS — J942 Hemothorax: Secondary | ICD-10-CM

## 2015-02-24 DIAGNOSIS — J948 Other specified pleural conditions: Secondary | ICD-10-CM

## 2015-02-24 NOTE — Progress Notes (Signed)
      HPI:  Patient returns for routine postoperative follow-up having undergone right VATS for drainage of a traumatic hemothorax on 01/24/2015. The patient's early postoperative recovery while in the hospital was notable for an uncomplicated postop course. Since hospital discharge the patient reports that he has been feeling fine. His breathing is normal.   Current Outpatient Prescriptions  Medication Sig Dispense Refill  . acetaminophen (TYLENOL) 500 MG tablet Take 500 mg by mouth 3 (three) times daily as needed (pain).    Marland Kitchen albuterol (VENTOLIN HFA) 108 (90 BASE) MCG/ACT inhaler Inhale 2 puffs into the lungs as needed. 1 Inhaler 6  . amLODipine (NORVASC) 5 MG tablet Take 1 tablet (5 mg total) by mouth daily. 30 tablet 1  . BIOTIN PO Take 1 capsule by mouth daily.    . cetirizine (ZYRTEC) 10 MG tablet Take 10 mg by mouth at bedtime.     Marland Kitchen CINNAMON PO Take 1 capsule by mouth at bedtime.     Marland Kitchen lisinopril-hydrochlorothiazide (ZESTORETIC) 20-12.5 MG per tablet Take 2 tablets by mouth daily. 90 tablet 1  . metFORMIN (GLUCOPHAGE) 1000 MG tablet Take 1 tablet (1,000 mg total) by mouth 2 (two) times daily with a meal. 90 tablet 1  . Multiple Vitamin (MULTIVITAMIN WITH MINERALS) TABS tablet Take 1 tablet by mouth at bedtime.     . rosuvastatin (CRESTOR) 40 MG tablet Take 1 tablet (40 mg total) by mouth daily at 6 PM. 30 tablet 1  . tamsulosin (FLOMAX) 0.4 MG CAPS capsule Take 1 capsule (0.4 mg total) by mouth daily. For one month then stop. 30 capsule 0  . traMADol (ULTRAM) 50 MG tablet Take 1 tablet (50 mg total) by mouth every 4 (four) hours as needed for moderate pain. 30 tablet 0  . TURMERIC PO Take 1 capsule by mouth at bedtime.      No current facility-administered medications for this visit.    Physical Exam: BP 153/89 mmHg  Pulse 109  Resp 20  Ht 6' (1.829 m)  Wt 257 lb (116.574 kg)  BMI 34.85 kg/m2  SpO2 98% He looks well. Lung exam is clear. Cardiac exam shows a regular rate  and rhythm with normal heart sounds. Chest incision is healing well     Diagnostic Tests:  CLINICAL DATA: Hemo pneumothorax. Right vats.  EXAM: CHEST 2 VIEW  COMPARISON: 01/29/2015.  FINDINGS: Mediastinum hilar structures are normal. Lungs are clear of acute infiltrates. Stable cardiomegaly with normal pulmonary vascularity. Interim partial resolution of small right pleural effusion. No pneumothorax. Multiple right rib fractures again noted.  IMPRESSION: 1. Interim partial clearing of small right pleural effusion. No pneumothorax.  2. Multiple right rib fractures again noted.   Electronically Signed  By: Maisie Fus Register  On: 02/24/2015 12:29   Impression:  He is recovering well following drainage of his traumatic hemothorax. He can return to normal activity.  Plan:  He does not need to return to see me. He will continue follow with his PCP.   Alleen Borne, MD [facility] [phone]

## 2015-03-18 ENCOUNTER — Other Ambulatory Visit: Payer: Self-pay | Admitting: Physician Assistant

## 2015-03-19 ENCOUNTER — Other Ambulatory Visit: Payer: Self-pay | Admitting: Family Medicine

## 2015-03-19 MED ORDER — METFORMIN HCL 1000 MG PO TABS: 1000.0000 mg | ORAL_TABLET | Freq: Two times a day (BID) | ORAL | Status: DC

## 2015-03-19 MED ORDER — LISINOPRIL-HYDROCHLOROTHIAZIDE 20-12.5 MG PO TABS: 2.0000 | ORAL_TABLET | Freq: Every day | ORAL | Status: DC

## 2015-03-19 NOTE — Addendum Note (Signed)
Addended by: Scharlene Gloss B on: 03/19/2015 05:02 PM   Modules accepted: Orders

## 2015-03-26 ENCOUNTER — Other Ambulatory Visit: Payer: Self-pay | Admitting: Physician Assistant

## 2015-03-29 ENCOUNTER — Telehealth: Payer: Self-pay | Admitting: Family Medicine

## 2015-03-29 MED ORDER — AMLODIPINE BESYLATE 5 MG PO TABS: 5.0000 mg | ORAL_TABLET | Freq: Every day | ORAL | Status: DC

## 2015-03-29 NOTE — Telephone Encounter (Signed)
Pharmacy: [id number] - [place], [place] - [address] [place] [address]  Reason for call: Pt needing refill on amLODipine. It was ordered by a hospital doc/PA. Pt has 2 days left.

## 2015-03-29 NOTE — Telephone Encounter (Signed)
Sent in amlodipine to [facility] in [place] and patient notified.

## 2015-05-21 ENCOUNTER — Ambulatory Visit (INDEPENDENT_AMBULATORY_CARE_PROVIDER_SITE_OTHER): Payer: Medicare Other | Admitting: Family Medicine

## 2015-05-21 ENCOUNTER — Ambulatory Visit (HOSPITAL_BASED_OUTPATIENT_CLINIC_OR_DEPARTMENT_OTHER)
Admission: RE | Admit: 2015-05-21 | Discharge: 2015-05-21 | Disposition: A | Discharge status: Home / Self Care | Payer: Medicare Other | Source: Ambulatory Visit | Attending: Family Medicine | Admitting: Family Medicine

## 2015-05-21 ENCOUNTER — Encounter: Payer: Self-pay | Admitting: Family Medicine

## 2015-05-21 VITALS — BP 132/88 | HR 90 | Temp 98.9°F | Ht 72.0 in | Wt 261.2 lb

## 2015-05-21 DIAGNOSIS — R062 Wheezing: Secondary | ICD-10-CM | POA: Insufficient documentation

## 2015-05-21 DIAGNOSIS — E785 Hyperlipidemia, unspecified: Secondary | ICD-10-CM | POA: Diagnosis not present

## 2015-05-21 DIAGNOSIS — R0989 Other specified symptoms and signs involving the circulatory and respiratory systems: Secondary | ICD-10-CM | POA: Diagnosis not present

## 2015-05-21 DIAGNOSIS — E119 Type 2 diabetes mellitus without complications: Secondary | ICD-10-CM | POA: Diagnosis not present

## 2015-05-21 DIAGNOSIS — J45909 Unspecified asthma, uncomplicated: Secondary | ICD-10-CM | POA: Diagnosis not present

## 2015-05-21 DIAGNOSIS — E1165 Type 2 diabetes mellitus with hyperglycemia: Secondary | ICD-10-CM | POA: Diagnosis not present

## 2015-05-21 DIAGNOSIS — R05 Cough: Secondary | ICD-10-CM | POA: Diagnosis not present

## 2015-05-21 DIAGNOSIS — J4520 Mild intermittent asthma, uncomplicated: Secondary | ICD-10-CM

## 2015-05-21 DIAGNOSIS — E669 Obesity, unspecified: Secondary | ICD-10-CM

## 2015-05-21 DIAGNOSIS — Z23 Encounter for immunization: Secondary | ICD-10-CM | POA: Diagnosis not present

## 2015-05-21 DIAGNOSIS — I10 Essential (primary) hypertension: Secondary | ICD-10-CM | POA: Diagnosis not present

## 2015-05-21 DIAGNOSIS — R7309 Other abnormal glucose: Secondary | ICD-10-CM | POA: Diagnosis not present

## 2015-05-21 LAB — CBC
HEMATOCRIT: 45 % (ref 39.0–52.0)
HEMOGLOBIN: 15.4 g/dL (ref 13.0–17.0)
MCH: 29.7 pg (ref 26.0–34.0)
MCHC: 34.2 g/dL (ref 30.0–36.0)
MCV: 86.9 fL (ref 78.0–100.0)
MPV: 10.1 fL (ref 8.6–12.4)
Platelets: 293 10*3/uL (ref 150–400)
RBC: 5.18 MIL/uL (ref 4.22–5.81)
RDW: 13.6 % (ref 11.5–15.5)
WBC: 9.3 10*3/uL (ref 4.0–10.5)

## 2015-05-21 LAB — HEMOGLOBIN A1C
HEMOGLOBIN A1C: 8.7 % — AB (ref <5.7)
MEAN PLASMA GLUCOSE: 203 mg/dL — AB (ref <117)

## 2015-05-21 LAB — COMPREHENSIVE METABOLIC PANEL
ALBUMIN: 4.6 g/dL (ref 3.6–5.1)
ALK PHOS: 56 U/L (ref 40–115)
ALT: 32 U/L (ref 9–46)
AST: 27 U/L (ref 10–35)
BILIRUBIN TOTAL: 0.7 mg/dL (ref 0.2–1.2)
BUN: 16 mg/dL (ref 7–25)
CALCIUM: 10.1 mg/dL (ref 8.6–10.3)
CO2: 32 mmol/L — ABNORMAL HIGH (ref 20–31)
Chloride: 95 mmol/L — ABNORMAL LOW (ref 98–110)
Creat: 0.98 mg/dL (ref 0.70–1.18)
GLUCOSE: 174 mg/dL — AB (ref 65–99)
POTASSIUM: 4.3 mmol/L (ref 3.5–5.3)
Sodium: 138 mmol/L (ref 135–146)
TOTAL PROTEIN: 7.6 g/dL (ref 6.1–8.1)

## 2015-05-21 LAB — LIPID PANEL
CHOL/HDL RATIO: 4.7 ratio (ref <=5.0)
CHOLESTEROL: 251 mg/dL — AB (ref 125–200)
HDL: 53 mg/dL (ref >=40)
LDL Cholesterol: 158 mg/dL — ABNORMAL HIGH (ref <130)
Triglycerides: 198 mg/dL — ABNORMAL HIGH (ref <150)
VLDL: 40 mg/dL — ABNORMAL HIGH (ref <30)

## 2015-05-21 MED ORDER — FLUTICASONE-SALMETEROL 100-50 MCG/DOSE IN AEPB: 1.0000 | INHALATION_SPRAY | Freq: Two times a day (BID) | RESPIRATORY_TRACT | Status: DC

## 2015-05-21 MED ORDER — ALBUTEROL SULFATE HFA 108 (90 BASE) MCG/ACT IN AERS: 2.0000 | INHALATION_SPRAY | RESPIRATORY_TRACT | Status: DC | PRN

## 2015-05-21 MED ORDER — ZOSTER VACCINE LIVE 19400 UNT/0.65ML ~~LOC~~ SOLR: 0.6500 mL | Freq: Once | SUBCUTANEOUS | Status: DC

## 2015-05-21 NOTE — Progress Notes (Signed)
Pre visit review using our clinic review tool, if applicable. No additional management support is needed unless otherwise documented below in the visit note. 

## 2015-05-21 NOTE — Patient Instructions (Addendum)
Encouraged increased rest and hydration, add probiotics, zinc such as Coldeze or Xicam. Treat fevers as needed. Plain Mucinex, Vitamin C 1000 mg.  Lavendar oil, NOW company, [email or web address]  Asthma Attack Prevention While you may not be able to control the fact that you have asthma, you can take actions to prevent asthma attacks. The best way to prevent asthma attacks is to maintain good control of your asthma. You can achieve this by:  Taking your medicines as directed.  Avoiding things that can irritate your airways or make your asthma symptoms worse (asthma triggers).  Keeping track of how well your asthma is controlled and of any changes in your symptoms.  Responding quickly to worsening asthma symptoms (asthma attack).  Seeking emergency care when it is needed. WHAT ARE SOME WAYS TO PREVENT AN ASTHMA ATTACK? Have a Plan Work with your health care provider to create a written plan for managing and treating your asthma attacks (asthma action plan). This plan includes:  A list of your asthma triggers and how you can avoid them.  Information on when medicines should be taken and when their dosages should be changed.  The use of a device that measures how well your lungs are working (peak flow meter). Monitor Your Asthma Use your peak flow meter and record your results in a journal every day. A drop in your peak flow numbers on one or more days may indicate the start of an asthma attack. This can happen even before you start to feel symptoms. You can prevent an asthma attack from getting worse by following the steps in your asthma action plan. Avoid Asthma Triggers Work with your asthma health care provider to find out what your asthma triggers are. This can be done by:  Allergy testing.  Keeping a journal that notes when asthma attacks occur and the factors that may have contributed to them.  Determining if there are other medical conditions that are making your asthma  worse. Once you have determined your asthma triggers, take steps to avoid them. This may include avoiding excessive or prolonged exposure to:  Dust. Have someone dust and vacuum your home for you once or twice a week. Using a high-efficiency particulate arrestance (HEPA) vacuum is best.  Smoke. This includes campfire smoke, forest fire smoke, and secondhand smoke from tobacco products.  Pet dander. Avoid contact with animals that you know you are allergic to.  Allergens from trees, grasses or pollens. Avoid spending a lot of time outdoors when pollen counts are high, and on very windy days.  Very cold, dry, or humid air.  Mold.  Foods that contain high amounts of sulfites.  Strong odors.  Outdoor air pollutants, such as Museum/gallery exhibitions officer.  Indoor air pollutants, such as aerosol sprays and fumes from household cleaners.  Household pests, including dust mites and cockroaches, and pest droppings.  Certain medicines, including NSAIDs. Always talk to your health care provider before stopping or starting any new medicines. Medicines Take over-the-counter and prescription medicines only as told by your health care provider. Many asthma attacks can be prevented by carefully following your medicine schedule. Taking your medicines correctly is especially important when you cannot avoid certain asthma triggers. Act Quickly If an asthma attack does happen, acting quickly can decrease how severe it is and how long it lasts. Take these steps:   Pay attention to your symptoms. If you are coughing, wheezing, or having difficulty breathing, do not wait to see if your symptoms go away on their own.  Follow your asthma action plan.  If you have followed your asthma action plan and your symptoms are not improving, call your health care provider or seek immediate medical care at the nearest hospital. It is important to note how often you need to use your fast-acting rescue inhaler. If you are using your  rescue inhaler more often, it may mean that your asthma is not under control. Adjusting your asthma treatment plan may help you to prevent future asthma attacks and help you to gain better control of your condition. HOW CAN I PREVENT AN ASTHMA ATTACK WHEN I EXERCISE? Follow advice from your health care provider about whether you should use your fast-acting inhaler before exercising. Many people with asthma experience exercise-induced bronchoconstriction (EIB). This condition often worsens during vigorous exercise in cold, humid, or dry environments. Usually, people with EIB can stay very active by pre-treating with a fast-acting inhaler before exercising.   This information is not intended to replace advice given to you by your health care provider. Make sure you discuss any questions you have with your health care provider.   Document Released: 04/19/2009 Document Revised: 01/20/2015 Document Reviewed: 10/01/2014 Elsevier Interactive Patient Education 2016 [employer] Soak feet in distilled white vinegar and hot water nightly, then use a pumice stone, then apply Vick's vapor rub or Lamisil to toenails nightly   Nail Ringworm A fungal infection of the nail (tinea unguium/onychomycosis) is common. It is common as the visible part of the nail is composed of dead cells which have no blood supply to help prevent infection. It occurs because fungi are everywhere and will pick any opportunity to grow on any dead material. Because nails are very slow growing they require up to 2 years of treatment with anti-fungal medications. The entire nail back to the base is infected. This includes approximately  of the nail which you cannot see. If your caregiver has prescribed a medication by mouth, take it every day and as directed. No progress will be seen for at least 6 to 9 months. Do not be disappointed! Because fungi live on dead cells with little or no exposure to blood supply, medication delivery to the  infection is slow; thus the cure is slow. It is also why you can observe no progress in the first 6 months. The nail becoming cured is the base of the nail, as it has the blood supply. Topical medication such as creams and ointments are usually not effective. Important in successful treatment of nail fungus is closely following the medication regimen that your doctor prescribes. Sometimes you and your caregiver may elect to speed up this process by surgical removal of all the nails. Even this may still require 6 to 9 months of additional oral medications. See your caregiver as directed. Remember there will be no visible improvement for at least 6 months. See your caregiver sooner if other signs of infection (redness and swelling) develop.   This information is not intended to replace advice given to you by your health care provider. Make sure you discuss any questions you have with your health care provider.   Document Released: 04/28/2000 Document Revised: 09/15/2014 Document Reviewed: 11/02/2014 Elsevier Interactive Patient Education [employer]

## 2015-05-22 LAB — TSH: TSH: 1.028 u[IU]/mL (ref 0.350–4.500)

## 2015-05-22 LAB — VITAMIN D 25 HYDROXY (VIT D DEFICIENCY, FRACTURES): VIT D 25 HYDROXY: 41 ng/mL (ref 30–100)

## 2015-05-24 ENCOUNTER — Encounter: Payer: Self-pay | Admitting: Family Medicine

## 2015-05-24 ENCOUNTER — Other Ambulatory Visit: Payer: Self-pay | Admitting: Family Medicine

## 2015-05-24 MED ORDER — GLIMEPIRIDE 1 MG PO TABS: 1.0000 mg | ORAL_TABLET | Freq: Every day | ORAL | Status: DC

## 2015-05-25 ENCOUNTER — Other Ambulatory Visit: Payer: Self-pay | Admitting: Family Medicine

## 2015-05-25 MED ORDER — PITAVASTATIN CALCIUM 4 MG PO TABS: 4.0000 mg | ORAL_TABLET | Freq: Every day | ORAL | Status: DC

## 2015-05-30 ENCOUNTER — Encounter: Payer: Self-pay | Admitting: Family Medicine

## 2015-05-30 NOTE — Assessment & Plan Note (Signed)
controlled, no changes to meds. Encouraged heart healthy diet such as the DASH diet and exercise as tolerated.  

## 2015-05-30 NOTE — Assessment & Plan Note (Signed)
Given refill on Albuterol 

## 2015-05-30 NOTE — Assessment & Plan Note (Signed)
hgba1c acceptable, minimize simple carbs. Increase exercise as tolerated. Continue current meds 

## 2015-05-30 NOTE — Assessment & Plan Note (Signed)
Encouraged DASH diet, decrease po intake and increase exercise as tolerated. Needs 7-8 hours of sleep nightly. Avoid trans fats, eat small, frequent meals every 4-5 hours with lean proteins, complex carbs and healthy fats. Minimize simple carbs 

## 2015-05-30 NOTE — Assessment & Plan Note (Signed)
Tolerating statin, encouraged heart healthy diet, avoid trans fats, minimize simple carbs and saturated fats. Increase exercise as tolerated 

## 2015-05-30 NOTE — Progress Notes (Signed)
Patient ID: David Huynh, male   DOB: 12/25/1940, 75 y.o.   MRN: [id number]   Subjective:    Patient ID: David Huynh, male    DOB: 01/03/1941, 75 y.o.   MRN: [id number]  Chief Complaint  Patient presents with  . Follow-up    HPI Patient is in today for follow-up. Feels well. Sugars have been slightly elevated. No polyuria or polydipsia. No recent hospitalization. Denies CP/palp/SOB/HA/congestion/fevers/GI or GU c/o. Taking meds as prescribed. Is trying to stay active.   Past Medical History  Diagnosis Date  . Hyperlipidemia   . Obesity   . Allergy     seasonal  . Asthma     mild, intermittent  . Diabetes mellitus     type 2  . Hypertension   . Liver function study, abnormal 04/26/2011  . Multiple rib fractures   . Hypocalcemia 02/12/2015  . Fracture of multiple ribs 02/12/2015    Past Surgical History  Procedure Laterality Date  . Appendectomy  1975  . Tonsillectomy and adenoidectomy  1947  . Cataract extraction  2010    b/l  . Retinal laser procedure  1985  . Video assisted thoracoscopy (vats)/thorocotomy Right 01/24/2015    Procedure: VIDEO ASSISTED THORACOSCOPY (VATS) FOR DRAINAGE OF RIGHT HEMOTHORAX;  Surgeon: Alleen Borne, MD;  Location: [facility] OR;  Service: Thoracic;  Laterality: Right;    History reviewed. No pertinent family history.  Social History   Social History  . Marital Status: Married    Spouse Name: N/A  . Number of Children: N/A  . Years of Education: N/A   Occupational History  . Not on file.   Social History Main Topics  . Smoking status: Never Smoker   . Smokeless tobacco: Never Used  . Alcohol Use: Yes     Comment: wine on weekends w/dinner  . Drug Use: No  . Sexual Activity:    Partners: Female   Other Topics Concern  . Not on file   Social History Narrative    Outpatient Prescriptions Prior to Visit  Medication Sig Dispense Refill  . acetaminophen (TYLENOL) 500 MG tablet Take 500 mg by mouth 3 (three) times daily as  needed (pain).    Marland Kitchen amLODipine (NORVASC) 5 MG tablet Take 1 tablet (5 mg total) by mouth daily. 30 tablet 3  . BIOTIN PO Take 1 capsule by mouth daily.    . cetirizine (ZYRTEC) 10 MG tablet Take 10 mg by mouth at bedtime.     Marland Kitchen CINNAMON PO Take 1 capsule by mouth at bedtime.     Marland Kitchen lisinopril-hydrochlorothiazide (ZESTORETIC) 20-12.5 MG tablet Take 2 tablets by mouth daily. 180 tablet 0  . metFORMIN (GLUCOPHAGE) 1000 MG tablet Take 1 tablet (1,000 mg total) by mouth 2 (two) times daily with a meal. 180 tablet 0  . Multiple Vitamin (MULTIVITAMIN WITH MINERALS) TABS tablet Take 1 tablet by mouth at bedtime.     . TURMERIC PO Take 1 capsule by mouth at bedtime.     Marland Kitchen albuterol (VENTOLIN HFA) 108 (90 BASE) MCG/ACT inhaler Inhale 2 puffs into the lungs as needed. 1 Inhaler 6  . rosuvastatin (CRESTOR) 40 MG tablet Take 1 tablet (40 mg total) by mouth daily at 6 PM. 30 tablet 1  . tamsulosin (FLOMAX) 0.4 MG CAPS capsule Take 1 capsule (0.4 mg total) by mouth daily. For one month then stop. 30 capsule 0  . traMADol (ULTRAM) 50 MG tablet Take 1 tablet (50 mg total) by mouth every 4 (  four) hours as needed for moderate pain. 30 tablet 0   No facility-administered medications prior to visit.    No Known Allergies  Review of Systems  Constitutional: Negative for fever, chills and malaise/fatigue.  HENT: Negative for congestion and hearing loss.   Eyes: Negative for discharge.  Respiratory: Negative for cough, sputum production and shortness of breath.   Cardiovascular: Negative for chest pain, palpitations and leg swelling.  Gastrointestinal: Negative for heartburn, nausea, vomiting, abdominal pain, diarrhea, constipation and blood in stool.  Genitourinary: Negative for dysuria, urgency, frequency and hematuria.  Musculoskeletal: Negative for myalgias, back pain and falls.  Skin: Negative for rash.  Neurological: Negative for dizziness, sensory change, loss of consciousness, weakness and headaches.    Endo/Heme/Allergies: Negative for environmental allergies. Does not bruise/bleed easily.  Psychiatric/Behavioral: Negative for depression and suicidal ideas. The patient is not nervous/anxious and does not have insomnia.        Objective:    Physical Exam  Constitutional: He is oriented to person, place, and time. He appears well-developed and well-nourished. No distress.  HENT:  Head: Normocephalic and atraumatic.  Eyes: Conjunctivae are normal.  Neck: Neck supple. No thyromegaly present.  Cardiovascular: Normal rate, regular rhythm and normal heart sounds.   No murmur heard. Pulmonary/Chest: Effort normal and breath sounds normal. No respiratory distress. He has no wheezes.  Abdominal: Soft. Bowel sounds are normal. He exhibits no mass. There is no tenderness.  Musculoskeletal: He exhibits no edema.  Lymphadenopathy:    He has no cervical adenopathy.  Neurological: He is alert and oriented to person, place, and time.  Skin: Skin is warm and dry.  Psychiatric: He has a normal mood and affect. His behavior is normal.    BP 132/88 mmHg  Pulse 90  Temp(Src) 98.9 F (37.2 C) (Oral)  Ht 6' (1.829 m)  Wt 261 lb 4 oz (118.502 kg)  BMI 35.42 kg/m2  SpO2 93% Wt Readings from Last 3 Encounters:  05/21/15 261 lb 4 oz (118.502 kg)  02/24/15 257 lb (116.574 kg)  02/12/15 257 lb 6 oz (116.745 kg)     Lab Results  Component Value Date   WBC 9.3 05/21/2015   HGB 15.4 05/21/2015   HCT 45.0 05/21/2015   PLT 293 05/21/2015   GLUCOSE 174* 05/21/2015   CHOL 251* 05/21/2015   TRIG 198* 05/21/2015   HDL 53 05/21/2015   LDLDIRECT 199.5 04/17/2011   LDLCALC 158* 05/21/2015   ALT 32 05/21/2015   AST 27 05/21/2015   NA 138 05/21/2015   K 4.3 05/21/2015   CL 95* 05/21/2015   CREATININE 0.98 05/21/2015   BUN 16 05/21/2015   CO2 32* 05/21/2015   TSH 1.028 05/21/2015   HGBA1C 8.7* 05/21/2015   MICROALBUR 17.6* 04/17/2011    Lab Results  Component Value Date   TSH 1.028  05/21/2015   Lab Results  Component Value Date   WBC 9.3 05/21/2015   HGB 15.4 05/21/2015   HCT 45.0 05/21/2015   MCV 86.9 05/21/2015   PLT 293 05/21/2015   Lab Results  Component Value Date   NA 138 05/21/2015   K 4.3 05/21/2015   CO2 32* 05/21/2015   GLUCOSE 174* 05/21/2015   BUN 16 05/21/2015   CREATININE 0.98 05/21/2015   BILITOT 0.7 05/21/2015   ALKPHOS 56 05/21/2015   AST 27 05/21/2015   ALT 32 05/21/2015   PROT 7.6 05/21/2015   ALBUMIN 4.6 05/21/2015   CALCIUM 10.1 05/21/2015   ANIONGAP 10 01/26/2015  GFR 82.46 02/12/2015   Lab Results  Component Value Date   CHOL 251* 05/21/2015   Lab Results  Component Value Date   HDL 53 05/21/2015   Lab Results  Component Value Date   LDLCALC 158* 05/21/2015   Lab Results  Component Value Date   TRIG 198* 05/21/2015   Lab Results  Component Value Date   CHOLHDL 4.7 05/21/2015   Lab Results  Component Value Date   HGBA1C 8.7* 05/21/2015       Assessment & Plan:   Problem List Items Addressed This Visit    Diabetes mellitus (HCC) - Primary (Chronic)    hgba1c acceptable, minimize simple carbs. Increase exercise as tolerated. Continue current meds      Relevant Medications   albuterol (VENTOLIN HFA) 108 (90 Base) MCG/ACT inhaler   Other Relevant Orders   TSH (Completed)   CBC (Completed)   Comprehensive metabolic panel (Completed)   Hemoglobin A1c (Completed)   VITAMIN D 25 Hydroxy (Vit-D Deficiency, Fractures) (Completed)   Lipid panel (Completed)   Hyperlipidemia    Tolerating statin, encouraged heart healthy diet, avoid trans fats, minimize simple carbs and saturated fats. Increase exercise as tolerated      Relevant Medications   albuterol (VENTOLIN HFA) 108 (90 Base) MCG/ACT inhaler   Other Relevant Orders   TSH (Completed)   CBC (Completed)   Comprehensive metabolic panel (Completed)   Hemoglobin A1c (Completed)   VITAMIN D 25 Hydroxy (Vit-D Deficiency, Fractures) (Completed)   Lipid  panel (Completed)   Hypertension (Chronic)    controlled, no changes to meds. Encouraged heart healthy diet such as the DASH diet and exercise as tolerated.       Relevant Medications   albuterol (VENTOLIN HFA) 108 (90 Base) MCG/ACT inhaler   Other Relevant Orders   TSH (Completed)   CBC (Completed)   Comprehensive metabolic panel (Completed)   Hemoglobin A1c (Completed)   VITAMIN D 25 Hydroxy (Vit-D Deficiency, Fractures) (Completed)   Lipid panel (Completed)   Hypocalcemia   Relevant Medications   albuterol (VENTOLIN HFA) 108 (90 Base) MCG/ACT inhaler   Other Relevant Orders   TSH (Completed)   CBC (Completed)   Comprehensive metabolic panel (Completed)   Hemoglobin A1c (Completed)   VITAMIN D 25 Hydroxy (Vit-D Deficiency, Fractures) (Completed)   Lipid panel (Completed)   Obesity    Encouraged DASH diet, decrease po intake and increase exercise as tolerated. Needs 7-8 hours of sleep nightly. Avoid trans fats, eat small, frequent meals every 4-5 hours with lean proteins, complex carbs and healthy fats. Minimize simple carbs       Other Visit Diagnoses    Asthma, mild intermittent, uncomplicated        Relevant Medications    albuterol (VENTOLIN HFA) 108 (90 Base) MCG/ACT inhaler    Fluticasone-Salmeterol (ADVAIR) 100-50 MCG/DOSE AEPB    Other Relevant Orders    TSH (Completed)    CBC (Completed)    Comprehensive metabolic panel (Completed)    Hemoglobin A1c (Completed)    VITAMIN D 25 Hydroxy (Vit-D Deficiency, Fractures) (Completed)    Lipid panel (Completed)    Need for viral immunization        Relevant Medications    zoster vaccine live, PF, (ZOSTAVAX) 30160 UNT/0.65ML injection    Other Relevant Orders    TSH (Completed)    CBC (Completed)    Comprehensive metabolic panel (Completed)    Hemoglobin A1c (Completed)    VITAMIN D 25 Hydroxy (Vit-D Deficiency, Fractures) (Completed)  Lipid panel (Completed)    Wheezing        Relevant Orders    DG Chest 2 View  (Completed)    Need for vaccination with 13-polyvalent pneumococcal conjugate vaccine        Relevant Orders    Pneumococcal conjugate vaccine 13-valent (Completed)       I have discontinued Mr. Vantol tamsulosin and traMADol. I am also having him start on zoster vaccine live (PF) and Fluticasone-Salmeterol. Additionally, I am having him maintain his cetirizine, multivitamin with minerals, CINNAMON PO, TURMERIC PO, acetaminophen, BIOTIN PO, lisinopril-hydrochlorothiazide, metFORMIN, amLODipine, cholecalciferol, and albuterol.  Meds ordered this encounter  Medications  . cholecalciferol (VITAMIN D) 1000 units tablet    Sig: Take 1,000 Units by mouth daily.  Marland Kitchen albuterol (VENTOLIN HFA) 108 (90 Base) MCG/ACT inhaler    Sig: Inhale 2 puffs into the lungs as needed.    Dispense:  1 Inhaler    Refill:  6  . zoster vaccine live, PF, (ZOSTAVAX) 16109 UNT/0.65ML injection    Sig: Inject 19,400 Units into the skin once.    Dispense:  1 each    Refill:  0  . Fluticasone-Salmeterol (ADVAIR) 100-50 MCG/DOSE AEPB    Sig: Inhale 1 puff into the lungs 2 (two) times daily.    Dispense:  1 each    Refill:  3     Danise Edge, MD

## 2015-06-20 ENCOUNTER — Other Ambulatory Visit: Payer: Self-pay | Admitting: Family Medicine

## 2015-07-31 ENCOUNTER — Other Ambulatory Visit: Payer: Self-pay | Admitting: Family Medicine

## 2015-09-10 ENCOUNTER — Ambulatory Visit: Payer: Self-pay | Admitting: Family Medicine

## 2016-02-08 ENCOUNTER — Other Ambulatory Visit: Payer: Self-pay | Admitting: Family Medicine

## 2016-02-08 DIAGNOSIS — E785 Hyperlipidemia, unspecified: Secondary | ICD-10-CM

## 2016-02-08 DIAGNOSIS — I10 Essential (primary) hypertension: Secondary | ICD-10-CM

## 2016-02-08 DIAGNOSIS — J4520 Mild intermittent asthma, uncomplicated: Secondary | ICD-10-CM

## 2016-02-08 DIAGNOSIS — E1165 Type 2 diabetes mellitus with hyperglycemia: Secondary | ICD-10-CM

## 2016-02-10 ENCOUNTER — Emergency Department (HOSPITAL_BASED_OUTPATIENT_CLINIC_OR_DEPARTMENT_OTHER): Payer: Medicare Other

## 2016-02-10 ENCOUNTER — Inpatient Hospital Stay (HOSPITAL_BASED_OUTPATIENT_CLINIC_OR_DEPARTMENT_OTHER)
Admission: EM | Admit: 2016-02-10 | Discharge: 2016-02-13 | DRG: 293 | Disposition: A | Discharge status: Home / Self Care | Payer: Medicare Other | Attending: Internal Medicine | Admitting: Internal Medicine

## 2016-02-10 ENCOUNTER — Encounter (HOSPITAL_BASED_OUTPATIENT_CLINIC_OR_DEPARTMENT_OTHER): Payer: Self-pay | Admitting: Respiratory Therapy

## 2016-02-10 DIAGNOSIS — R609 Edema, unspecified: Secondary | ICD-10-CM | POA: Diagnosis not present

## 2016-02-10 DIAGNOSIS — Z9841 Cataract extraction status, right eye: Secondary | ICD-10-CM | POA: Diagnosis not present

## 2016-02-10 DIAGNOSIS — E119 Type 2 diabetes mellitus without complications: Secondary | ICD-10-CM

## 2016-02-10 DIAGNOSIS — Z6839 Body mass index (BMI) 39.0-39.9, adult: Secondary | ICD-10-CM | POA: Diagnosis not present

## 2016-02-10 DIAGNOSIS — I5031 Acute diastolic (congestive) heart failure: Secondary | ICD-10-CM | POA: Diagnosis not present

## 2016-02-10 DIAGNOSIS — I5043 Acute on chronic combined systolic (congestive) and diastolic (congestive) heart failure: Secondary | ICD-10-CM | POA: Diagnosis present

## 2016-02-10 DIAGNOSIS — I10 Essential (primary) hypertension: Secondary | ICD-10-CM

## 2016-02-10 DIAGNOSIS — E785 Hyperlipidemia, unspecified: Secondary | ICD-10-CM | POA: Diagnosis present

## 2016-02-10 DIAGNOSIS — I5021 Acute systolic (congestive) heart failure: Secondary | ICD-10-CM | POA: Diagnosis not present

## 2016-02-10 DIAGNOSIS — I160 Hypertensive urgency: Secondary | ICD-10-CM | POA: Diagnosis not present

## 2016-02-10 DIAGNOSIS — R0600 Dyspnea, unspecified: Secondary | ICD-10-CM | POA: Diagnosis not present

## 2016-02-10 DIAGNOSIS — R0602 Shortness of breath: Secondary | ICD-10-CM | POA: Diagnosis not present

## 2016-02-10 DIAGNOSIS — I119 Hypertensive heart disease without heart failure: Secondary | ICD-10-CM

## 2016-02-10 DIAGNOSIS — Z7984 Long term (current) use of oral hypoglycemic drugs: Secondary | ICD-10-CM

## 2016-02-10 DIAGNOSIS — I428 Other cardiomyopathies: Secondary | ICD-10-CM | POA: Diagnosis present

## 2016-02-10 DIAGNOSIS — R05 Cough: Secondary | ICD-10-CM | POA: Diagnosis not present

## 2016-02-10 DIAGNOSIS — J4530 Mild persistent asthma, uncomplicated: Secondary | ICD-10-CM | POA: Diagnosis present

## 2016-02-10 DIAGNOSIS — I509 Heart failure, unspecified: Secondary | ICD-10-CM | POA: Diagnosis not present

## 2016-02-10 DIAGNOSIS — I110 Hypertensive heart disease with heart failure: Secondary | ICD-10-CM | POA: Diagnosis not present

## 2016-02-10 DIAGNOSIS — Z9842 Cataract extraction status, left eye: Secondary | ICD-10-CM | POA: Diagnosis not present

## 2016-02-10 DIAGNOSIS — E6609 Other obesity due to excess calories: Secondary | ICD-10-CM | POA: Diagnosis present

## 2016-02-10 DIAGNOSIS — E669 Obesity, unspecified: Secondary | ICD-10-CM | POA: Diagnosis present

## 2016-02-10 DIAGNOSIS — J90 Pleural effusion, not elsewhere classified: Secondary | ICD-10-CM

## 2016-02-10 DIAGNOSIS — E1165 Type 2 diabetes mellitus with hyperglycemia: Secondary | ICD-10-CM | POA: Diagnosis present

## 2016-02-10 LAB — CBC WITH DIFFERENTIAL/PLATELET
BASOS PCT: 1 %
Basophils Absolute: 0.1 10*3/uL (ref 0.0–0.1)
EOS ABS: 0.3 10*3/uL (ref 0.0–0.7)
Eosinophils Relative: 4 %
HCT: 44.2 % (ref 39.0–52.0)
HEMOGLOBIN: 14.8 g/dL (ref 13.0–17.0)
Lymphocytes Relative: 24 %
Lymphs Abs: 2.3 10*3/uL (ref 0.7–4.0)
MCH: 30.6 pg (ref 26.0–34.0)
MCHC: 33.5 g/dL (ref 30.0–36.0)
MCV: 91.3 fL (ref 78.0–100.0)
MONO ABS: 0.8 10*3/uL (ref 0.1–1.0)
MONOS PCT: 9 %
NEUTROS PCT: 62 %
Neutro Abs: 6 10*3/uL (ref 1.7–7.7)
PLATELETS: 202 10*3/uL (ref 150–400)
RBC: 4.84 MIL/uL (ref 4.22–5.81)
RDW: 13.1 % (ref 11.5–15.5)
WBC: 9.6 10*3/uL (ref 4.0–10.5)

## 2016-02-10 LAB — COMPREHENSIVE METABOLIC PANEL
ALBUMIN: 4.1 g/dL (ref 3.5–5.0)
ALT: 37 U/L (ref 17–63)
ANION GAP: 6 (ref 5–15)
AST: 28 U/L (ref 15–41)
Alkaline Phosphatase: 53 U/L (ref 38–126)
BUN: 16 mg/dL (ref 6–20)
CALCIUM: 9.3 mg/dL (ref 8.9–10.3)
CHLORIDE: 105 mmol/L (ref 101–111)
CO2: 28 mmol/L (ref 22–32)
Creatinine, Ser: 0.99 mg/dL (ref 0.61–1.24)
GFR calc Af Amer: >60 mL/min (ref >60)
GFR calc non Af Amer: >60 mL/min (ref >60)
GLUCOSE: 223 mg/dL — AB (ref 65–99)
POTASSIUM: 3.7 mmol/L (ref 3.5–5.1)
SODIUM: 139 mmol/L (ref 135–145)
Total Bilirubin: 1.5 mg/dL — ABNORMAL HIGH (ref 0.3–1.2)
Total Protein: 7.2 g/dL (ref 6.5–8.1)

## 2016-02-10 LAB — GLUCOSE, CAPILLARY: Glucose-Capillary: 222 mg/dL — ABNORMAL HIGH (ref 65–99)

## 2016-02-10 LAB — TROPONIN I
Troponin I: 0.04 ng/mL (ref <0.03)
Troponin I: 0.05 ng/mL (ref <0.03)

## 2016-02-10 LAB — BRAIN NATRIURETIC PEPTIDE: B NATRIURETIC PEPTIDE 5: 542.5 pg/mL — AB (ref 0.0–100.0)

## 2016-02-10 MED ORDER — ACETAMINOPHEN 325 MG PO TABS
650.0000 mg | ORAL_TABLET | Freq: Four times a day (QID) | ORAL | Status: DC | PRN
  Administered 2016-02-10 – 2016-02-11 (×2): 650 mg via ORAL
  Filled 2016-02-10 (×2): qty 2

## 2016-02-10 MED ORDER — ALBUTEROL SULFATE (2.5 MG/3ML) 0.083% IN NEBU: 2.5000 mg | INHALATION_SOLUTION | RESPIRATORY_TRACT | Status: DC | PRN

## 2016-02-10 MED ORDER — ACETAMINOPHEN 325 MG PO TABS
650.0000 mg | ORAL_TABLET | Freq: Once | ORAL | Status: AC
  Administered 2016-02-10: 650 mg via ORAL

## 2016-02-10 MED ORDER — LABETALOL HCL 5 MG/ML IV SOLN
10.0000 mg | Freq: Once | INTRAVENOUS | Status: AC
  Administered 2016-02-10: 10 mg via INTRAVENOUS
  Filled 2016-02-10: qty 4

## 2016-02-10 MED ORDER — MOMETASONE FURO-FORMOTEROL FUM 100-5 MCG/ACT IN AERO
2.0000 | INHALATION_SPRAY | Freq: Two times a day (BID) | RESPIRATORY_TRACT | Status: DC
  Administered 2016-02-11 – 2016-02-13 (×4): 2 via RESPIRATORY_TRACT
  Filled 2016-02-10 (×3): qty 8.8

## 2016-02-10 MED ORDER — LISINOPRIL 10 MG PO TABS
20.0000 mg | ORAL_TABLET | Freq: Once | ORAL | Status: AC
  Administered 2016-02-10: 20 mg via ORAL
  Filled 2016-02-10: qty 2

## 2016-02-10 MED ORDER — AMLODIPINE BESYLATE 5 MG PO TABS
5.0000 mg | ORAL_TABLET | Freq: Once | ORAL | Status: AC
  Administered 2016-02-10: 5 mg via ORAL
  Filled 2016-02-10: qty 1

## 2016-02-10 MED ORDER — INSULIN ASPART 100 UNIT/ML ~~LOC~~ SOLN
0.0000 [IU] | Freq: Three times a day (TID) | SUBCUTANEOUS | Status: DC
  Administered 2016-02-11: 3 [IU] via SUBCUTANEOUS
  Administered 2016-02-11: 2 [IU] via SUBCUTANEOUS
  Administered 2016-02-11 – 2016-02-12 (×2): 3 [IU] via SUBCUTANEOUS
  Administered 2016-02-12 (×2): 5 [IU] via SUBCUTANEOUS
  Administered 2016-02-13 (×2): 3 [IU] via SUBCUTANEOUS

## 2016-02-10 MED ORDER — NITROGLYCERIN IN D5W 200-5 MCG/ML-% IV SOLN
0.0000 ug/min | Freq: Once | INTRAVENOUS | Status: AC
  Administered 2016-02-10: 5 ug/min via INTRAVENOUS
  Filled 2016-02-10: qty 250

## 2016-02-10 MED ORDER — HYDRALAZINE HCL 20 MG/ML IJ SOLN
5.0000 mg | Freq: Four times a day (QID) | INTRAMUSCULAR | Status: DC | PRN
  Administered 2016-02-10: 5 mg via INTRAVENOUS
  Filled 2016-02-10: qty 1

## 2016-02-10 MED ORDER — SODIUM CHLORIDE 0.9 % IV SOLN: INTRAVENOUS | Status: DC

## 2016-02-10 MED ORDER — NITROGLYCERIN 0.4 MG SL SUBL
0.4000 mg | SUBLINGUAL_TABLET | SUBLINGUAL | Status: DC | PRN
  Filled 2016-02-10 (×3): qty 1

## 2016-02-10 MED ORDER — ACETAMINOPHEN 325 MG PO TABS
ORAL_TABLET | ORAL | Status: AC
  Administered 2016-02-10: 650 mg via ORAL
  Filled 2016-02-10: qty 2

## 2016-02-10 MED ORDER — LISINOPRIL-HYDROCHLOROTHIAZIDE 20-12.5 MG PO TABS: 2.0000 | ORAL_TABLET | Freq: Every day | ORAL | Status: DC

## 2016-02-10 MED ORDER — ACETAMINOPHEN 650 MG RE SUPP: 650.0000 mg | Freq: Four times a day (QID) | RECTAL | Status: DC | PRN

## 2016-02-10 MED ORDER — INSULIN ASPART 100 UNIT/ML ~~LOC~~ SOLN
0.0000 [IU] | Freq: Every day | SUBCUTANEOUS | Status: DC
  Administered 2016-02-10: 2 [IU] via SUBCUTANEOUS

## 2016-02-10 MED ORDER — NITROGLYCERIN 2 % TD OINT
1.0000 [in_us] | TOPICAL_OINTMENT | Freq: Once | TRANSDERMAL | Status: AC
  Administered 2016-02-10: 1 [in_us] via TOPICAL
  Filled 2016-02-10: qty 1

## 2016-02-10 MED ORDER — POTASSIUM CHLORIDE CRYS ER 20 MEQ PO TBCR
40.0000 meq | EXTENDED_RELEASE_TABLET | Freq: Two times a day (BID) | ORAL | Status: DC
Start: 2016-02-10 — End: 2016-02-13
  Administered 2016-02-10 – 2016-02-12 (×5): 40 meq via ORAL
  Filled 2016-02-10 (×6): qty 2

## 2016-02-10 MED ORDER — PRAVASTATIN SODIUM 20 MG PO TABS
20.0000 mg | ORAL_TABLET | Freq: Every day | ORAL | Status: DC
  Administered 2016-02-11 – 2016-02-12 (×2): 20 mg via ORAL
  Filled 2016-02-10 (×3): qty 1

## 2016-02-10 MED ORDER — FUROSEMIDE 10 MG/ML IJ SOLN
40.0000 mg | Freq: Two times a day (BID) | INTRAMUSCULAR | Status: DC
Start: 2016-02-11 — End: 2016-02-13
  Administered 2016-02-11 – 2016-02-13 (×5): 40 mg via INTRAVENOUS
  Filled 2016-02-10 (×5): qty 4

## 2016-02-10 MED ORDER — SODIUM CHLORIDE 0.9 % IV SOLN
INTRAVENOUS | Status: AC
  Administered 2016-02-10: 21:00:00 via INTRAVENOUS

## 2016-02-10 MED ORDER — FUROSEMIDE 10 MG/ML IJ SOLN
40.0000 mg | Freq: Once | INTRAMUSCULAR | Status: AC
  Administered 2016-02-10: 40 mg via INTRAVENOUS
  Filled 2016-02-10: qty 4

## 2016-02-10 MED ORDER — AMLODIPINE BESYLATE 5 MG PO TABS
5.0000 mg | ORAL_TABLET | Freq: Every day | ORAL | Status: DC
  Administered 2016-02-11: 5 mg via ORAL
  Filled 2016-02-10: qty 1

## 2016-02-10 MED ORDER — LISINOPRIL 40 MG PO TABS
40.0000 mg | ORAL_TABLET | Freq: Every day | ORAL | Status: DC
  Administered 2016-02-11 – 2016-02-13 (×3): 40 mg via ORAL
  Filled 2016-02-10 (×3): qty 1

## 2016-02-10 MED ORDER — ENOXAPARIN SODIUM 40 MG/0.4ML ~~LOC~~ SOLN: 40.0000 mg | Freq: Every day | SUBCUTANEOUS | Status: DC

## 2016-02-10 MED ORDER — HYDROCHLOROTHIAZIDE 25 MG PO TABS
25.0000 mg | ORAL_TABLET | Freq: Every day | ORAL | Status: DC
  Administered 2016-02-11 – 2016-02-12 (×2): 25 mg via ORAL
  Filled 2016-02-10 (×2): qty 1

## 2016-02-10 NOTE — H&P (Signed)
History and Physical  Patient Name: David Huynh     ZOX:[id number]    DOB: 1940-11-19    DOA: 02/10/2016 PCP: Danise Edge, MD   Patient coming from: Home  Chief Complaint: Dyspnea  HPI: David Huynh is a 75 y.o. male with a past medical history significant for HTN, NIDDM who presents with progressive dyspnea for one month.  The patient was in his usual state of health until August, when he had "my typical annual August asthma flare". He started back Advair, used albuterol, and the chest congestion gradually cleared, but he was left with a feeling of shortness of breath with exertion but did not go away. Now over the last 3 weeks he has continued to have this shortness of breath, progressively worsening, not improved with albuterol. He has also noticed some swelling in his legs and increased abdominal girth and shortness of breath with lying flat especially in the last week after using phenylephrine containing OTC cold medicine for his lingering shortness of breath.  Then last night at 3 AM he woke up very short of breath, got up and walked around and set at his desk and gradually this improved. He has had no fever, chills, sputum.   ED course: -Afebrile, heart rate 110s, respirations 20s, blood pressure 187/115, pulse oximetry normal -Na 139, K 3.7, Cr 1.0, WBC 9.6K, Hgb 14.8 -BNP 542 -Troponin 0.04 -Weight 279 (weight at PCPs office in January to 50) -He was given IV furosemide and had some improvement in his symptoms and [facility] were asked to accept in transfer for CHF flare         ROS: Review of Systems  Constitutional: Negative for chills, fever and weight loss.  Respiratory: Positive for shortness of breath. Negative for cough, hemoptysis, sputum production and wheezing.   Cardiovascular: Positive for orthopnea, leg swelling and PND. Negative for chest pain.  All other systems reviewed and are negative.         Past Medical History:  Diagnosis Date  . Allergy      seasonal  . Asthma    mild, intermittent  . Diabetes mellitus    type 2  . Fracture of multiple ribs 02/12/2015  . Hyperlipidemia   . Hypertension   . Hypocalcemia 02/12/2015  . Liver function study, abnormal 04/26/2011  . Multiple rib fractures   . Obesity     Past Surgical History:  Procedure Laterality Date  . APPENDECTOMY  1975  . CATARACT EXTRACTION  2010   b/l  . RETINAL LASER PROCEDURE  1985  . TONSILLECTOMY AND ADENOIDECTOMY  1947  . VIDEO ASSISTED THORACOSCOPY (VATS)/THOROCOTOMY Right 01/24/2015   Procedure: VIDEO ASSISTED THORACOSCOPY (VATS) FOR DRAINAGE OF RIGHT HEMOTHORAX;  Surgeon: Alleen Borne, MD;  Location: [facility] OR;  Service: Thoracic;  Laterality: Right;    Social History: Patient lives with his wife.  The patient walks unasssited, still drives.  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  He does not smoke.    No Known Allergies  Family history: Mother and father were healtyh until they died in their 73s of "heart problems"  Prior to Admission medications   Medication Sig Start Date End Date Taking? Authorizing Provider  fexofenadine (ALLEGRA) 30 MG tablet Take 30 mg by mouth 2 (two) times daily.   Yes Historical Provider, MD  acetaminophen (TYLENOL) 500 MG tablet Take 500 mg by mouth 3 (three) times daily as needed (pain).    Historical Provider, MD  amLODipine (NORVASC) 5 MG  tablet TAKE 1 TABLET(5 MG) BY MOUTH DAILY 08/01/15   Bradd Canary, MD  BIOTIN PO Take 1 capsule by mouth daily.    Historical Provider, MD  cholecalciferol (VITAMIN D) 1000 units tablet Take 1,000 Units by mouth daily.    Historical Provider, MD  CINNAMON PO Take 1 capsule by mouth at bedtime.     Historical Provider, MD  Fluticasone-Salmeterol (ADVAIR) 100-50 MCG/DOSE AEPB Inhale 1 puff into the lungs 2 (two) times daily. 05/21/15   Bradd Canary, MD  glimepiride (AMARYL) 1 MG tablet Take 1 tablet (1 mg total) by mouth daily. 05/24/15   Bradd Canary, MD  lisinopril-hydrochlorothiazide  (PRINZIDE,ZESTORETIC) 20-12.5 MG tablet TAKE 2 TABLETS BY MOUTH DAILY 06/20/15   Bradd Canary, MD  metFORMIN (GLUCOPHAGE) 1000 MG tablet TAKE 1 TABLET(1000 MG) BY MOUTH TWICE DAILY WITH A MEAL 06/20/15   Bradd Canary, MD  Multiple Vitamin (MULTIVITAMIN WITH MINERALS) TABS tablet Take 1 tablet by mouth at bedtime.     Historical Provider, MD  Pitavastatin Calcium (LIVALO) 4 MG TABS Take 1 tablet (4 mg total) by mouth daily. 05/25/15   Bradd Canary, MD  TURMERIC PO Take 1 capsule by mouth at bedtime.     Historical Provider, MD  VENTOLIN HFA 108 (90 Base) MCG/ACT inhaler INHALE 2 PUFFS INTO THE LUNGS AS NEEDED 02/08/16   Bradd Canary, MD  zoster vaccine live, PF, (ZOSTAVAX) 09407 UNT/0.65ML injection Inject 19,400 Units into the skin once. 05/21/15   Bradd Canary, MD       Physical Exam: BP (!) 163/107 (BP Location: Right Arm)   Pulse 100   Temp 98 F (36.7 C) (Oral)   Resp (!) 22   Ht 6' (1.829 m)   Wt 126.9 kg (279 lb 11.2 oz)   SpO2 99%   BMI 37.93 kg/m  General appearance: Well-developed, elderly adult male, alert and in no acute distress.   Eyes: Anicteric, conjunctiva pink, lids and lashes normal. PERRL.    ENT: No nasal deformity, discharge, epistaxis.  Hearing normal. OP moist without lesions.   Neck: No neck masses.  Trachea midline.  No thyromegaly/tenderness. Lymph: No cervical or supraclavicular lymphadenopathy. Skin: Warm and dry.  No jaundice.  No suspicious rashes or lesions. Cardiac: RRR, nl S1-S2, no murmurs appreciated.  Capillary refill is brisk.  JVP elevated to jaw.  Pitting LE edema to knee.  Radial pulses 2+ and symmetric. Respiratory: Normal respiratory rate and rhythm.  CTAB without rales or wheezes. Abdomen: Abdomen soft.  No TTP. No ascites, distension, hepatosplenomegaly.   MSK: No deformities or effusions.  No cyanosis or clubbing. Neuro: Cranial nerves normal.  Sensation intact to light touch. Speech is fluent.  Muscle strength normal.    Psych:  Sensorium intact and responding to questions, attention normal.  Behavior appropriate.  Affect normal.  Judgment and insight appear normal.     Labs on Admission:  I have personally reviewed following labs and imaging studies: CBC:  Recent Labs Lab 02/10/16 1050  WBC 9.6  NEUTROABS 6.0  HGB 14.8  HCT 44.2  MCV 91.3  PLT 202   Basic Metabolic Panel:  Recent Labs Lab 02/10/16 1050  NA 139  K 3.7  CL 105  CO2 28  GLUCOSE 223*  BUN 16  CREATININE 0.99  CALCIUM 9.3   GFR: Estimated Creatinine Clearance: 90.1 mL/min (by C-G formula based on SCr of 0.99 mg/dL).  Liver Function Tests:  Recent Labs Lab 02/10/16 1050  AST 28  ALT 37  ALKPHOS 53  BILITOT 1.5*  PROT 7.2  ALBUMIN 4.1   No results for input(s): LIPASE, AMYLASE in the last 168 hours. No results for input(s): AMMONIA in the last 168 hours. Coagulation Profile: No results for input(s): INR, PROTIME in the last 168 hours. Cardiac Enzymes:  Recent Labs Lab 02/10/16 1050 02/10/16 1430  TROPONINI 0.04* 0.05*   BNP (last 3 results) No results for input(s): PROBNP in the last 8760 hours. HbA1C: No results for input(s): HGBA1C in the last 72 hours. CBG:  Recent Labs Lab 02/10/16 2042  GLUCAP 222*   Lipid Profile: No results for input(s): CHOL, HDL, LDLCALC, TRIG, CHOLHDL, LDLDIRECT in the last 72 hours. Thyroid Function Tests: No results for input(s): TSH, T4TOTAL, FREET4, T3FREE, THYROIDAB in the last 72 hours. Anemia Panel: No results for input(s): VITAMINB12, FOLATE, FERRITIN, TIBC, IRON, RETICCTPCT in the last 72 hours. Sepsis Labs: Invalid input(s): PROCALCITONIN, LACTICIDVEN No results found for this or any previous visit (from the past 240 hour(s)).       Radiological Exams on Admission: Personally reviewed: Dg Chest 2 View  Result Date: 02/10/2016 CLINICAL DATA:  Cough, chest congestion, and shortness of breath for the past 3 weeks. Previous episode of pneumothorax due to rib  fractures. History of asthma. EXAM: CHEST  2 VIEW COMPARISON:  PA and lateral chest x-ray of May 21, 2015 FINDINGS: The lungs are mildly hyperinflated. There is no pneumothorax. The interstitial markings are increased bilaterally. The left hemidiaphragm is partially obscured. There small pleural effusions blunting the posterior costophrenic angles. The cardiac silhouette is mildly enlarged. The pulmonary vascularity is mildly engorged. There is calcification in the wall of the aortic arch. The bony thorax exhibits no acute abnormality. IMPRESSION: CHF with mild interstitial edema. Trace bilateral pleural effusions. No alveolar pneumonia. Underlying reactive airway disease. Electronically Signed   By: David  Swazil[place] M.D.   On: 02/10/2016 11:30    EKG: Independently reviewed. Rate 126, QTc 461.  No significant ST changes.    Assessment/Plan 1. Acute CHF unknown type:    EF unknown at present.  Dry weight ~260 lbs? -Furosemide 40 mg BID -Potassium supplement -Strict ins and outs, daily weights -Daily BMP -Continue ACE inhibitor -Continue statin -Obtain echocardiogram   2. Elevated troponin:  -Trend troponins -Consult cardiology, appreciate recommendations -Full aspirin now and start baby aspirin  3. HTN:  Very hypertensive at admission. -Continue amlodipine for now -Continue lisinopril, HCTZ -Hydralazine when necessary  4. Mild persistent asthma:  -Albuterol when necessary -Continue Dulera  5. NIDDM:  -Hold metformin -Sliding scale with meals -Checking hemoglobin A1c     DVT prophylaxis: Lovenox  Code Status: FULL  Family Communication: None present  Disposition Plan: Anticipate diuresis and close monitoring of renal function.  Cardiology consult for elevated troponin.   Consults called: Cardiology Admission status: INPAITENT, tele       Medical decision making: Patient seen at 10:30 PM on 02/10/2016.  What exists of the patient's chart was reviewed in depth and  summarized above.  Clinical condition: stable.        Alberteen SamChristopher P Danford [facility] Pager [phone]

## 2016-02-10 NOTE — ED Notes (Signed)
States he feels more SOB. Pt assisted to sit up on side of bed. RT at bedside to assess. Lung sounds unchanged. Pt placed on 2L  for comfort. States he feels better sitting on side of bed. Family at bedside. Pt medicated per MD order. Urinal within reach

## 2016-02-10 NOTE — Progress Notes (Signed)
   Patient coming from [facility] for treatment of likely new onset CHF. Patient complaining of 2 weeks of worsening shortness of breath and productive cough. Patient with baseline lower extremity edema. Denies any chest pain. Thumb have mildly elevated troponin with BNP 542 (previous 134), CXR showing cardiomegaly and passive pulmonary congestion with minimal bilateral pleural effusions. No white count and afebrile. Lasix 40 mg IV given with brisk diuresis. Patient found to have significantly elevated blood pressure with narrow pulse pressure. Patient did not take blood pressure medications prior to arrival at the ED. Blood pressure improved after Lasix and administration of home blood pressure medications. Patient now on a nitro drip. Patient accepted as a full inpatient admission to the telemetry floor.   Shelly Flatten, MD [facility] 02/10/2016, 3:45 PM

## 2016-02-10 NOTE — ED Notes (Signed)
Patient transported to X-ray 

## 2016-02-10 NOTE — ED Notes (Signed)
MD at bedside. 

## 2016-02-10 NOTE — ED Provider Notes (Signed)
[facility] Provider Note   CSN: [id number] Arrival date & time: 02/10/16  1000     History   Chief Complaint Chief Complaint  Patient presents with  . Shortness of Breath    HPI David Huynh is a 75 y.o. male.  Patient presenting with worsening shortness of breath. Patient had some shortness of breath the end of August with a productive cough that cleared up in the beginning of September and then last evening had significant increase in shortness of breath. Used his albuterol as well as an epinephrine type inhaler that he got over-the-counter. Patient's had lateral leg swelling for some time now. Symptoms are associated with exertional shortness of breath said no chest pain no chest discomfort.      Past Medical History:  Diagnosis Date  . Allergy    seasonal  . Asthma    mild, intermittent  . Diabetes mellitus    type 2  . Fracture of multiple ribs 02/12/2015  . Hyperlipidemia   . Hypertension   . Hypocalcemia 02/12/2015  . Liver function study, abnormal 04/26/2011  . Multiple rib fractures   . Obesity     Patient Active Problem List   Diagnosis Date Noted  . CHF (congestive heart failure) (HCC) 02/10/2016  . Hypocalcemia 02/12/2015  . Fracture of multiple ribs 02/12/2015  . Other specified forms of effusion, except tuberculous   . Other and unspecified hyperlipidemia   . Hemothorax on right 01/23/2015  . Liver function study, abnormal 04/26/2011  . Carotid bruit 08/05/2010  . Obesity   . Hypertension   . Hyperlipidemia   . Diabetes mellitus (HCC)   . Asthma   . Allergic state     Past Surgical History:  Procedure Laterality Date  . APPENDECTOMY  1975  . CATARACT EXTRACTION  2010   b/l  . RETINAL LASER PROCEDURE  1985  . TONSILLECTOMY AND ADENOIDECTOMY  1947  . VIDEO ASSISTED THORACOSCOPY (VATS)/THOROCOTOMY Right 01/24/2015   Procedure: VIDEO ASSISTED THORACOSCOPY (VATS) FOR DRAINAGE OF RIGHT HEMOTHORAX;  Surgeon: Alleen BorneBryan K Bartle, MD;   Location: [facility];  Service: Thoracic;  Laterality: Right;       Home Medications    Prior to Admission medications   Medication Sig Start Date End Date Taking? Authorizing Provider  fexofenadine (ALLEGRA) 30 MG tablet Take 30 mg by mouth 2 (two) times daily.   Yes Historical Provider, MD  acetaminophen (TYLENOL) 500 MG tablet Take 500 mg by mouth 3 (three) times daily as needed (pain).    Historical Provider, MD  amLODipine (NORVASC) 5 MG tablet TAKE 1 TABLET(5 MG) BY MOUTH DAILY 08/01/15   Bradd CanaryStacey A Blyth, MD  BIOTIN PO Take 1 capsule by mouth daily.    Historical Provider, MD  cholecalciferol (VITAMIN D) 1000 units tablet Take 1,000 Units by mouth daily.    Historical Provider, MD  CINNAMON PO Take 1 capsule by mouth at bedtime.     Historical Provider, MD  Fluticasone-Salmeterol (ADVAIR) 100-50 MCG/DOSE AEPB Inhale 1 puff into the lungs 2 (two) times daily. 05/21/15   Bradd CanaryStacey A Blyth, MD  glimepiride (AMARYL) 1 MG tablet Take 1 tablet (1 mg total) by mouth daily. 05/24/15   Bradd CanaryStacey A Blyth, MD  lisinopril-hydrochlorothiazide (PRINZIDE,ZESTORETIC) 20-12.5 MG tablet TAKE 2 TABLETS BY MOUTH DAILY 06/20/15   Bradd CanaryStacey A Blyth, MD  metFORMIN (GLUCOPHAGE) 1000 MG tablet TAKE 1 TABLET(1000 MG) BY MOUTH TWICE DAILY WITH A MEAL 06/20/15   Bradd CanaryStacey A Blyth, MD  Multiple Vitamin (MULTIVITAMIN  WITH MINERALS) TABS tablet Take 1 tablet by mouth at bedtime.     Historical Provider, MD  Pitavastatin Calcium (LIVALO) 4 MG TABS Take 1 tablet (4 mg total) by mouth daily. 05/25/15   Bradd Canary, MD  TURMERIC PO Take 1 capsule by mouth at bedtime.     Historical Provider, MD  VENTOLIN HFA 108 (90 Base) MCG/ACT inhaler INHALE 2 PUFFS INTO THE LUNGS AS NEEDED 02/08/16   Bradd Canary, MD  zoster vaccine live, PF, (ZOSTAVAX) 13086 UNT/0.65ML injection Inject 19,400 Units into the skin once. 05/21/15   Bradd Canary, MD    Family History No family history on file.  Social History Social History  Substance Use Topics  .  Smoking status: Never Smoker  . Smokeless tobacco: Never Used  . Alcohol use Yes     Comment: wine on weekends w/dinner     Allergies   Review of patient's allergies indicates no known allergies.   Review of Systems Review of Systems  Constitutional: Negative for fever.  HENT: Positive for congestion.   Eyes: Negative for visual disturbance.  Respiratory: Positive for cough and shortness of breath.   Cardiovascular: Positive for leg swelling. Negative for chest pain.  Gastrointestinal: Negative for abdominal pain, nausea and vomiting.  Genitourinary: Negative for dysuria.  Musculoskeletal: Negative for back pain.  Skin: Negative for rash.  Neurological: Negative for headaches.  Hematological: Does not bruise/bleed easily.  Psychiatric/Behavioral: Negative for confusion.     Physical Exam Updated Vital Signs BP 160/91   Pulse 104   Temp 98.4 F (36.9 C) (Oral)   Resp 24   Ht 6' (1.829 m)   Wt 130.6 kg   SpO2 96%   BMI 39.06 kg/m   Physical Exam  Constitutional: He is oriented to person, place, and time. He appears well-developed and well-nourished. No distress.  HENT:  Head: Normocephalic and atraumatic.  Mouth/Throat: Oropharynx is clear and moist.  Eyes: EOM are normal. Pupils are equal, round, and reactive to light.  Neck: Normal range of motion. Neck supple.  Cardiovascular: Regular rhythm and normal heart sounds.   Tachycardic  Pulmonary/Chest: Effort normal and breath sounds normal. No respiratory distress. He has no wheezes. He has no rales.  Abdominal: Soft. Bowel sounds are normal. There is no tenderness.  Musculoskeletal: Normal range of motion. He exhibits edema.  Neurological: He is alert and oriented to person, place, and time. No cranial nerve deficit. He exhibits normal muscle tone. Coordination normal.  Skin: Skin is warm.  Nursing note and vitals reviewed.    ED Treatments / Results  Labs (all labs ordered are listed, but only abnormal  results are displayed) Labs Reviewed  BRAIN NATRIURETIC PEPTIDE - Abnormal; Notable for the following:       Result Value   B Natriuretic Peptide 542.5 (*)    All other components within normal limits  TROPONIN I - Abnormal; Notable for the following:    Troponin I 0.04 (*)    All other components within normal limits  COMPREHENSIVE METABOLIC PANEL - Abnormal; Notable for the following:    Glucose, Bld 223 (*)    Total Bilirubin 1.5 (*)    All other components within normal limits  CBC WITH DIFFERENTIAL/PLATELET  TROPONIN I   Results for orders placed or performed during the hospital encounter of 02/10/16  Brain natriuretic peptide  Result Value Ref Range   B Natriuretic Peptide 542.5 (H) 0.0 - 100.0 pg/mL  Troponin I  Result Value  Ref Range   Troponin I 0.04 (HH) <0.03 ng/mL  CBC with Differential/Platelet  Result Value Ref Range   WBC 9.6 4.0 - 10.5 K/uL   RBC 4.84 4.22 - 5.81 MIL/uL   Hemoglobin 14.8 13.0 - 17.0 g/dL   HCT 91.4 78.2 - 95.6 %   MCV 91.3 78.0 - 100.0 fL   MCH 30.6 26.0 - 34.0 pg   MCHC 33.5 30.0 - 36.0 g/dL   RDW 21.3 08.6 - 57.8 %   Platelets 202 150 - 400 K/uL   Neutrophils Relative % 62 %   Neutro Abs 6.0 1.7 - 7.7 K/uL   Lymphocytes Relative 24 %   Lymphs Abs 2.3 0.7 - 4.0 K/uL   Monocytes Relative 9 %   Monocytes Absolute 0.8 0.1 - 1.0 K/uL   Eosinophils Relative 4 %   Eosinophils Absolute 0.3 0.0 - 0.7 K/uL   Basophils Relative 1 %   Basophils Absolute 0.1 0.0 - 0.1 K/uL  Comprehensive metabolic panel  Result Value Ref Range   Sodium 139 135 - 145 mmol/L   Potassium 3.7 3.5 - 5.1 mmol/L   Chloride 105 101 - 111 mmol/L   CO2 28 22 - 32 mmol/L   Glucose, Bld 223 (H) 65 - 99 mg/dL   BUN 16 6 - 20 mg/dL   Creatinine, Ser 4.69 0.61 - 1.24 mg/dL   Calcium 9.3 8.9 - 62.9 mg/dL   Total Protein 7.2 6.5 - 8.1 g/dL   Albumin 4.1 3.5 - 5.0 g/dL   AST 28 15 - 41 U/L   ALT 37 17 - 63 U/L   Alkaline Phosphatase 53 38 - 126 U/L   Total Bilirubin 1.5  (H) 0.3 - 1.2 mg/dL   GFR calc non Af Amer >60 >60 mL/min   GFR calc Af Amer >60 >60 mL/min   Anion gap 6 5 - 15  Troponin I  Result Value Ref Range   Troponin I 0.05 (HH) <0.03 ng/mL   Yes all that EKG  EKG  EKG Interpretation  Date/Time:  Thursday February 10 2016 10:37:11 EDT Ventricular Rate:  126 PR Interval:    QRS Duration: 102 QT Interval:  318 QTC Calculation: 461 R Axis:   82 Text Interpretation:  Sinus tachycardia Borderline right axis deviation Minimal ST depression, inferior leads Confirmed by Irja Wheless  MD, Xenia Nile ([id number]) on 02/10/2016 10:46:43 AM       Radiology Dg Chest 2 View  Result Date: 02/10/2016 CLINICAL DATA:  Cough, chest congestion, and shortness of breath for the past 3 weeks. Previous episode of pneumothorax due to rib fractures. History of asthma. EXAM: CHEST  2 VIEW COMPARISON:  PA and lateral chest x-ray of May 21, 2015 FINDINGS: The lungs are mildly hyperinflated. There is no pneumothorax. The interstitial markings are increased bilaterally. The left hemidiaphragm is partially obscured. There small pleural effusions blunting the posterior costophrenic angles. The cardiac silhouette is mildly enlarged. The pulmonary vascularity is mildly engorged. There is calcification in the wall of the aortic arch. The bony thorax exhibits no acute abnormality. IMPRESSION: CHF with mild interstitial edema. Trace bilateral pleural effusions. No alveolar pneumonia. Underlying reactive airway disease. Electronically Signed   By: David  [place] M.D.   On: 02/10/2016 11:30    Procedures Procedures (including critical care time)  CRITICAL CARE Performed by: Vanetta Mulders Total critical care time: 30 minutes Critical care time was exclusive of separately billable procedures and treating other patients. Critical care was necessary to treat or prevent imminent or  life-threatening deterioration. Critical care was time spent personally by me on the following activities:  development of treatment plan with patient and/or surrogate as well as nursing, discussions with consultants, evaluation of patient's response to treatment, examination of patient, obtaining history from patient or surrogate, ordering and performing treatments and interventions, ordering and review of laboratory studies, ordering and review of radiographic studies, pulse oximetry and re-evaluation of patient's condition.   Medications Ordered in ED Medications  0.9 %  sodium chloride infusion (not administered)  furosemide (LASIX) injection 40 mg (40 mg Intravenous Given 02/10/16 1224)  amLODipine (NORVASC) tablet 5 mg (5 mg Oral Given 02/10/16 1235)  lisinopril (PRINIVIL,ZESTRIL) tablet 20 mg (20 mg Oral Given 02/10/16 1235)  nitroGLYCERIN 50 mg in dextrose 5 % 250 mL (0.2 mg/mL) infusion (20 mcg/min Intravenous Rate/Dose Change 02/10/16 1504)     Initial Impression / Assessment and Plan / ED Course  I have reviewed the triage vital signs and the nursing notes.  Pertinent labs & imaging results that were available during my care of the patient were reviewed by me and considered in my medical decision making (see chart for details).  Clinical Course   Patient presents with shortness of breath. Patient had the productive cough and some shortness of breath the end of August going in the beginning of September but got better than last evening got significantly worse. He was using his albuterol inhalers which includes an epinephrine inhaler. Patient's had long-term leg swelling. Patient was admitted about a year ago for hemothorax that was originally admitted for bilateral pleural effusions and shortness of breath similar to what we have now. They felt that the hemothorax was traumatic related. Patient's blood pressure was significantly elevated here says he did not take his blood pressure medicine today. Blood pressure is 187/126. Patient's oxygen saturations were fine though they were in the upper 90s  on room air. The patient was eventually started on 2 L of oxygen discuss it made him feel more comfortable. Based on chest x-ray findings which showed small pleural effusions and CHF patient was originally given 40 mg of IV Lasix with excellent diuresis and improvement in his breathing. The blood pressure remains significantly elevated even after giving him his normal blood pressure medicines. Patient started on nitroglycerin drip. Blood pressure is now improving and a systolic of 160 and diastolic of 91. Patient states she's feeling much better.  Discussed with the hospitalist he will be admitted to [facility] telemetry. They will get cardiology involved patient probably needs echocardiogram as well determine what's been going on with the leg swelling long-term and wise developing CHF.  Patients had no chest pain or any chest pressure and a mild elevation in his first troponin of 0.04. Year ago patient had mild elevations in his troponin is well and this may be baseline for him. However repeat troponin was ordered.   Final Clinical Impressions(s) / ED Diagnoses   Final diagnoses:  Congestive heart failure, unspecified congestive heart failure chronicity, unspecified congestive heart failure type [facility])  Essential hypertension  Pleural effusion  Hypertensive urgency    New Prescriptions New Prescriptions   No medications on file     Vanetta Mulders, MD 02/10/16 [phone]

## 2016-02-10 NOTE — ED Triage Notes (Signed)
Pt having sob with productive cough for over one month.  Some swelling in lower extremities.  Pt states that it is not new.  Some exertional sob.  Breathing improves with rest.

## 2016-02-11 ENCOUNTER — Inpatient Hospital Stay (HOSPITAL_COMMUNITY): Payer: Medicare Other

## 2016-02-11 DIAGNOSIS — I110 Hypertensive heart disease with heart failure: Principal | ICD-10-CM

## 2016-02-11 DIAGNOSIS — E6609 Other obesity due to excess calories: Secondary | ICD-10-CM

## 2016-02-11 DIAGNOSIS — I5030 Unspecified diastolic (congestive) heart failure: Secondary | ICD-10-CM

## 2016-02-11 DIAGNOSIS — I5031 Acute diastolic (congestive) heart failure: Secondary | ICD-10-CM

## 2016-02-11 DIAGNOSIS — I10 Essential (primary) hypertension: Secondary | ICD-10-CM

## 2016-02-11 LAB — BASIC METABOLIC PANEL
Anion gap: 5 (ref 5–15)
BUN: 14 mg/dL (ref 6–20)
CHLORIDE: 104 mmol/L (ref 101–111)
CO2: 30 mmol/L (ref 22–32)
CREATININE: 1.02 mg/dL (ref 0.61–1.24)
Calcium: 9.4 mg/dL (ref 8.9–10.3)
GFR calc Af Amer: >60 mL/min (ref >60)
GFR calc non Af Amer: >60 mL/min (ref >60)
GLUCOSE: 204 mg/dL — AB (ref 65–99)
POTASSIUM: 4.3 mmol/L (ref 3.5–5.1)
Sodium: 139 mmol/L (ref 135–145)

## 2016-02-11 LAB — GLUCOSE, CAPILLARY
GLUCOSE-CAPILLARY: 196 mg/dL — AB (ref 65–99)
GLUCOSE-CAPILLARY: 144 mg/dL — AB (ref 65–99)
GLUCOSE-CAPILLARY: 164 mg/dL — AB (ref 65–99)
Glucose-Capillary: 182 mg/dL — ABNORMAL HIGH (ref 65–99)

## 2016-02-11 LAB — D-DIMER, QUANTITATIVE: D-Dimer, Quant: 1.13 ug/mL-FEU — ABNORMAL HIGH (ref 0.00–0.50)

## 2016-02-11 LAB — TROPONIN I
Troponin I: 0.04 ng/mL (ref <0.03)
Troponin I: 0.04 ng/mL (ref <0.03)

## 2016-02-11 LAB — TSH: TSH: 1.542 u[IU]/mL (ref 0.350–4.500)

## 2016-02-11 LAB — MAGNESIUM: Magnesium: 2.1 mg/dL (ref 1.7–2.4)

## 2016-02-11 MED ORDER — ASPIRIN EC 81 MG PO TBEC
81.0000 mg | DELAYED_RELEASE_TABLET | Freq: Every day | ORAL | Status: DC
  Administered 2016-02-12 – 2016-02-13 (×2): 81 mg via ORAL
  Filled 2016-02-11 (×2): qty 1

## 2016-02-11 MED ORDER — IOPAMIDOL (ISOVUE-370) INJECTION 76%
INTRAVENOUS | Status: AC
  Administered 2016-02-11: 80 mL
  Filled 2016-02-11: qty 100

## 2016-02-11 MED ORDER — CARVEDILOL 6.25 MG PO TABS
6.2500 mg | ORAL_TABLET | Freq: Two times a day (BID) | ORAL | Status: DC
  Administered 2016-02-12 – 2016-02-13 (×3): 6.25 mg via ORAL
  Filled 2016-02-11 (×5): qty 1

## 2016-02-11 MED ORDER — AMLODIPINE BESYLATE 10 MG PO TABS
10.0000 mg | ORAL_TABLET | Freq: Every day | ORAL | Status: DC
  Administered 2016-02-12 – 2016-02-13 (×2): 10 mg via ORAL
  Filled 2016-02-11 (×2): qty 1

## 2016-02-11 MED ORDER — ENOXAPARIN SODIUM 60 MG/0.6ML ~~LOC~~ SOLN
60.0000 mg | Freq: Every day | SUBCUTANEOUS | Status: DC
  Administered 2016-02-11 – 2016-02-13 (×3): 60 mg via SUBCUTANEOUS
  Filled 2016-02-11 (×3): qty 0.6

## 2016-02-11 MED ORDER — HYDRALAZINE HCL 20 MG/ML IJ SOLN
10.0000 mg | INTRAMUSCULAR | Status: DC | PRN
  Administered 2016-02-11: 10 mg via INTRAVENOUS
  Filled 2016-02-11: qty 1

## 2016-02-11 MED ORDER — ASPIRIN 325 MG PO TABS
325.0000 mg | ORAL_TABLET | Freq: Once | ORAL | Status: AC
  Administered 2016-02-11: 325 mg via ORAL
  Filled 2016-02-11: qty 1

## 2016-02-11 NOTE — Progress Notes (Signed)
1525 Received a call from CMT pt tachycardic on 120's sustain . Pt found comfortably sitting on chair . Conversant with spouse . Had been in the bathroom before then ' Pt asymptomatic Will monitor

## 2016-02-11 NOTE — Consult Note (Signed)
Cardiology Consult    Patient ID: David Huynh MRN: [id number], DOB/AGE: 1940-06-14   Admit date: 02/10/2016 Date of Consult: 02/11/2016  Primary Physician: Danise EdgeBLYTH, STACEY, MD Reason for Consult: CHF Primary Cardiologist: New  Requesting Provider: Dr. Isidoro Donningai  Patient Profile    David Huynh is a 75 year old male with a past medical history of asthma, DM, HLD, HTN, and obesity. He presented to the ED on 02/10/16 with cough and BLE edema.   History of Present Illness  David Huynh says that for the past few weeks he has had increasing bilateral lower extremity edema. He also notes that his stomach was getting tight and felt solid, eventually he became dyspneic.   He was recently admitted for an asthma exacerbation in August of this year and he initially thought that his SOB was related to his asthma however his inhalers did not help with his increasing SOB. On the night of 02/09/16 he became acutely SOB at 3am, description consistent with PND. His symptoms abated after he got up and walked around for a few minutes.   He tells me that he has a long standing history of hypertension, he notes that in high school he wasn't allow to play football because his blood pressure was too high. He was hypertensive on arrival to the ED, he tells me that he is compliant with all his medications.   David Huynh is a retired [employer], he says that most days he is active but does not have an exercise regimen. He denies ever smoking, he does drink alcohol but usually only wine with dinner. He does not watch his salt intake, he cooks most of his food at home.   Past Medical History   Past Medical History:  Diagnosis Date  . Allergy    seasonal  . Asthma    mild, intermittent  . Diabetes mellitus    type 2  . Fracture of multiple ribs 02/12/2015  . Hyperlipidemia   . Hypertension   . Hypocalcemia 02/12/2015  . Liver function study, abnormal 04/26/2011  . Multiple rib fractures   . Obesity     Past  Surgical History:  Procedure Laterality Date  . APPENDECTOMY  1975  . CATARACT EXTRACTION  2010   b/l  . RETINAL LASER PROCEDURE  1985  . TONSILLECTOMY AND ADENOIDECTOMY  1947  . VIDEO ASSISTED THORACOSCOPY (VATS)/THOROCOTOMY Right 01/24/2015   Procedure: VIDEO ASSISTED THORACOSCOPY (VATS) FOR DRAINAGE OF RIGHT HEMOTHORAX;  Surgeon: Alleen BorneBryan K Bartle, MD;  Location: [facility] OR;  Service: Thoracic;  Laterality: Right;     Allergies  No Known Allergies  Inpatient Medications    . amLODipine  5 mg Oral Daily  . [START ON 02/12/2016] aspirin EC  81 mg Oral Daily  . enoxaparin (LOVENOX) injection  60 mg Subcutaneous Daily  . furosemide  40 mg Intravenous BID  . lisinopril  40 mg Oral Daily   And  . hydrochlorothiazide  25 mg Oral Daily  . insulin aspart  0-15 Units Subcutaneous TID WC  . insulin aspart  0-5 Units Subcutaneous QHS  . iopamidol      . mometasone-formoterol  2 puff Inhalation BID  . potassium chloride  40 mEq Oral BID  . pravastatin  20 mg Oral q1800    Family History    History reviewed. No pertinent family history.  Social History    Social History   Social History  . Marital status: Married    Spouse name: N/A  .  Number of children: N/A  . Years of education: N/A   Occupational History  . Not on file.   Social History Main Topics  . Smoking status: Never Smoker  . Smokeless tobacco: Never Used  . Alcohol use Yes     Comment: wine on weekends w/dinner  . Drug use: No  . Sexual activity: Yes    Partners: Female   Other Topics Concern  . Not on file   Social History Narrative  . No narrative on file     Review of Systems    General:  No chills, fever, night sweats or weight changes.  Cardiovascular:  No chest pain, +dyspnea on exertion, +edema, orthopnea, palpitations, + paroxysmal nocturnal dyspnea. Dermatological: No rash, lesions/masses Respiratory: No cough, dyspnea Urologic: No hematuria, dysuria Abdominal:   No nausea, vomiting, diarrhea,  bright red blood per rectum, melena, or hematemesis Neurologic:  No visual changes, wkns, changes in mental status. All other systems reviewed and are otherwise negative except as noted above.  Physical Exam    Blood pressure (!) 160/114, pulse (!) 122, temperature 97.8 F (36.6 C), temperature source Oral, resp. rate 20, height 6' (1.829 m), weight 278 lb 6.4 oz (126.3 kg), SpO2 97 %.  General: Pleasant, NAD Psych: Normal affect. Neuro: Alert and oriented X 3. Moves all extremities spontaneously. HEENT: Normal  Neck: Supple without bruits or JVD. Lungs:  Resp regular and unlabored, CTA. Heart: RRR no s3, s4, or murmurs. Abdomen: Firm, protuberant abdomen non-tender, non-distended, BS + x 4.  Extremities: No clubbing, cyanosis, generalized  edema. DP/PT/Radials 2+ and equal bilaterally.  Labs     Recent Labs  02/10/16 1050 02/10/16 1430 02/10/16 2255 02/11/16 0412  TROPONINI 0.04* 0.05* 0.04* 0.04*   Lab Results  Component Value Date   WBC 9.6 02/10/2016   HGB 14.8 02/10/2016   HCT 44.2 02/10/2016   MCV 91.3 02/10/2016   PLT 202 02/10/2016    Recent Labs Lab 02/10/16 1050 02/11/16 0412  NA 139 139  K 3.7 4.3  CL 105 104  CO2 28 30  BUN 16 14  CREATININE 0.99 1.02  CALCIUM 9.3 9.4  PROT 7.2  --   BILITOT 1.5*  --   ALKPHOS 53  --   ALT 37  --   AST 28  --   GLUCOSE 223* 204*     Radiology Studies    Dg Chest 2 View  Result Date: 02/10/2016 CLINICAL DATA:  Cough, chest congestion, and shortness of breath for the past 3 weeks. Previous episode of pneumothorax due to rib fractures. History of asthma. EXAM: CHEST  2 VIEW COMPARISON:  PA and lateral chest x-ray of May 21, 2015 FINDINGS: The lungs are mildly hyperinflated. There is no pneumothorax. The interstitial markings are increased bilaterally. The left hemidiaphragm is partially obscured. There small pleural effusions blunting the posterior costophrenic angles. The cardiac silhouette is mildly enlarged.  The pulmonary vascularity is mildly engorged. There is calcification in the wall of the aortic arch. The bony thorax exhibits no acute abnormality. IMPRESSION: CHF with mild interstitial edema. Trace bilateral pleural effusions. No alveolar pneumonia. Underlying reactive airway disease. Electronically Signed   By: David  [place] M.D.   On: 02/10/2016 11:30    EKG & Cardiac Imaging    EKG: Sinus tach  Echocardiogram: pending   Assessment & Plan    1. Presumed acute on chronic diastolic CHF: Patient presents with BLE edema, dyspnea and PND. He says he immediately felt better after a  dose of Lasix, he has had almost 2 L out, after 2nd dose of lasix. Can add beta blocker when is closer to being euvolemic, hold off for now. Would suggest adding Coreg 6.25mg  BID when no longer in acute decompensation.  He should continue on IV Lasix, for today and likely tomorrow. I suspect we have a way to go.   His Echo is pending, will follow results. He will need follow up at our office for CHF.   We discussed diet modifications and the importance of weight loss.   2. Accelerated HTN with diastolic heart failure: Hypertensive, will increase amlodipine to 10mg , continue 40mg  of lisinopril. Will add IV hydralazine 10mg  for SBP > 150.   3. HLD: LDL was 158 in January of this year. He is on Livalo 4mg  outpatient, on pravastatin here, continue same.     Signed, Little Ishikawa, NP 02/11/2016, 1:08 PM Pager: [phone]   I have seen, examined and evaluated the patient this PM along with Suzzette Righter, NP.  After reviewing all the available data and chart, we discussed the patients laboratory, study & physical findings as well as symptoms in detail. I agree with her findings, examination as well as impression recommendations as per our discussion.    75 year old gentleman with subacute presentation for what seems like a new diagnosis of congestive heart failure. He has long-standing hypertension and therefore  presumably with this would be considered to be accelerated hypertension with diastolic heart failure/acute diastolic heart failure.  He feels much better after having received IV Lasix. Stating his breathing is improved and his edema has gone down some. Elevation of dyspnea, edema and then abdominal swelling that led him to come to the emergency room. He still has maybe 2+ pedal edema and distended abdomen with JVD of at least 11-12 cmH20.  I still suspect that he requires additional IV diuresis. Echocardiogram is pending, I would not be surprised based on the extent of right-sided involvement of his heart failure is stigmata that he may have a reduced ejection fraction. Further ischemic evaluation would be recommended if his EF is down indicating systolic and diastolic heart failure.  He also has symptoms that would suggest possible OSA, and this could explain some of his apparent right-sided failure.  I agree with increasing lisinopril dose and amlodipine in order to increase afterload reduction given his ongoing hypertension. Will use IV hydralazine for additional blood pressure control until his standing medications kick in.  He is tachycardic, and would benefit from a beta blocker, however with him being relatively acute in presentation, I would wait at least a diuresis today and then start on a beta blocker tomorrow with carvedilol.   I spent about 35 minutes as addition to the 35-40 minutes spent by Suzzette Righter, NP discussing the diagnosis of congestive heart failure, and the different etiologies etc. We also discussed the potential treatments. Greater than 50% of the time with the patient was in direct consultation.    David Huynh, M.D., M.S. Interventional Cardiologist   Pager # [phone] Phone # [phone] [address] [place], [place] [address]

## 2016-02-11 NOTE — Progress Notes (Signed)
[facility]                                                                              Patient Demographics  David Huynh, is a 75 y.o. male, DOB - 1940/07/21, WJX:[id number]  Admit date - 02/10/2016   Admitting Physician Ozella Rocks, MD  Outpatient Primary MD for the patient is Danise Edge, MD  Outpatient specialists:   LOS - 1  days    Chief Complaint  Patient presents with  . Shortness of Breath       Brief summary    David Huynh is a 75 y.o. male with a past medical history significant for HTN, NIDDM who presented with progressive dyspnea for one month. The patient was in his usual state of health until August, when he had "my typical annual August asthma flare". He started back Advair, albuterol, and the chest congestion gradually cleared, but he was left with a feeling of shortness of breath with exertion but did not go away. Now over the last 3 weeks he has continued to have this shortness of breath, progressively worsening, not improved with albuterol. He has also noticed some swelling in his legs and increased abdominal girth and shortness of breath with lying flat especially in the last week after using phenylephrine containing OTC cold medicine for his lingering shortness of breath.Then a night before the admission, at 3 AM he woke up very short of breath, got up and walked around and set at his desk and gradually this improved. He has had no fever, chills, sputum.  In ED, patient was Afebrile, heart rate 110s, respirations 20s, blood pressure 187/115, pulse oximetry normal -BNP 542, Troponin 0.04. He was given IV furosemide and had some improvement in his symptoms.  Assessment & Plan   Acute CHF unknown type:    - Patient with clinical symptoms off peripheral edema, weight gain, orthopnea, PND, BNP elevated 542, troponin 0.04 - Symptoms improving, continue IV Lasix diuresis -Strict I's and O's and daily weights - Asymmetric swelling in  the lower extremities, check venous Doppler. D-dimer 1.13, obtain CT angiogram of the chest to rule out PE - Follow 2-D echo   Elevated troponin:  -Obtain serial cardiac enzymes, cardiology consult called by the admitting physician.  - 2-D echo pending - Continue aspirin  Accelerated hypertension - Continue amlodipine, lisinopril, HCTZ, hydralazine prn   Mild persistent asthma:  -Albuterol when necessary -Continue Dulera   NIDDM:  -Hold metformin -Sliding scale with meals -Checking hemoglobin A1c  Code Status: Full CODE STATUS DVT Prophylaxis:  Lovenox  Family Communication: Discussed in detail with the patient, all imaging results, lab results explained to the patient  Disposition Plan:   Time Spent in minutes   25 minutes  Procedures:   Consultants:   Cardiology  Antimicrobials:      Medications  Scheduled Meds: . amLODipine  5 mg Oral Daily  . [START ON 02/12/2016] aspirin EC  81 mg Oral Daily  . enoxaparin (LOVENOX) injection  60 mg Subcutaneous Daily  . furosemide  40 mg Intravenous BID  . lisinopril  40 mg Oral Daily   And  .  hydrochlorothiazide  25 mg Oral Daily  . insulin aspart  0-15 Units Subcutaneous TID WC  . insulin aspart  0-5 Units Subcutaneous QHS  . mometasone-formoterol  2 puff Inhalation BID  . potassium chloride  40 mEq Oral BID  . pravastatin  20 mg Oral q1800   Continuous Infusions:  PRN Meds:.acetaminophen **OR** acetaminophen, albuterol, hydrALAZINE   Antibiotics   Anti-infectives    None        Subjective:   David Huynh was seen and examined today.  Per patient shortness of breath is improving, overall improving from yesterday. Patient denies dizziness, chest pain, abdominal pain, N/V/D/C, new weakness, numbess, tingling. No acute events overnight.    Objective:   Vitals:   02/10/16 2200 02/11/16 0425 02/11/16 0810 02/11/16 1159  BP: (!) 160/100 (!) 147/97 (!) 159/104 (!) 160/114  Pulse:  93 (!) 124 (!) 122    Resp:  18 20 20   Temp:  97.7 F (36.5 C) 98.8 F (37.1 C) 97.8 F (36.6 C)  TempSrc:  Oral Oral Oral  SpO2:  93% 100% 97%  Weight:  126.3 kg (278 lb 6.4 oz)    Height:        Intake/Output Summary (Last 24 hours) at 02/11/16 1202 Last data filed at 02/11/16 1005  Gross per 24 hour  Intake              340 ml  Output             2650 ml  Net            -2310 ml     Wt Readings from Last 3 Encounters:  02/11/16 126.3 kg (278 lb 6.4 oz)  05/21/15 118.5 kg (261 lb 4 oz)  02/24/15 116.6 kg (257 lb)     Exam  General: Alert and oriented x 3, NAD  HEENT:  PERRLA, EOMI,  Neck: Supple, +JVD  Cardiovascular: S1 S2 auscultated, tachycardia, no rubs, murmurs or gallops. Regular rate and rhythm.  Respiratory: Decreased breath sounds at the bases  Gastrointestinal: Obese, Soft, nontender, nondistended, + bowel sounds  Ext: no cyanosis clubbing, 1-2+ edema Rt >Lt  Neuro: AAOx3, Cr N's II- XII. Strength 5/5 upper and lower extremities bilaterally  Skin: No rashes  Psych: Normal affect and demeanor, alert and oriented x3    Data Reviewed:  I have personally reviewed following labs and imaging studies  Micro Results No results found for this or any previous visit (from the past 240 hour(s)).  Radiology Reports Dg Chest 2 View  Result Date: 02/10/2016 CLINICAL DATA:  Cough, chest congestion, and shortness of breath for the past 3 weeks. Previous episode of pneumothorax due to rib fractures. History of asthma. EXAM: CHEST  2 VIEW COMPARISON:  PA and lateral chest x-ray of May 21, 2015 FINDINGS: The lungs are mildly hyperinflated. There is no pneumothorax. The interstitial markings are increased bilaterally. The left hemidiaphragm is partially obscured. There small pleural effusions blunting the posterior costophrenic angles. The cardiac silhouette is mildly enlarged. The pulmonary vascularity is mildly engorged. There is calcification in the wall of the aortic arch. The  bony thorax exhibits no acute abnormality. IMPRESSION: CHF with mild interstitial edema. Trace bilateral pleural effusions. No alveolar pneumonia. Underlying reactive airway disease. Electronically Signed   By: David  [place] M.D.   On: 02/10/2016 11:30    Lab Data:  CBC:  Recent Labs Lab 02/10/16 1050  WBC 9.6  NEUTROABS 6.0  HGB 14.8  HCT 44.2  MCV  91.3  PLT 202   Basic Metabolic Panel:  Recent Labs Lab 02/10/16 1050 02/10/16 2255 02/11/16 0412  NA 139  --  139  K 3.7  --  4.3  CL 105  --  104  CO2 28  --  30  GLUCOSE 223*  --  204*  BUN 16  --  14  CREATININE 0.99  --  1.02  CALCIUM 9.3  --  9.4  MG  --  2.1  --    GFR: Estimated Creatinine Clearance: 87.3 mL/min (by C-G formula based on SCr of 1.02 mg/dL). Liver Function Tests:  Recent Labs Lab 02/10/16 1050  AST 28  ALT 37  ALKPHOS 53  BILITOT 1.5*  PROT 7.2  ALBUMIN 4.1   No results for input(s): LIPASE, AMYLASE in the last 168 hours. No results for input(s): AMMONIA in the last 168 hours. Coagulation Profile: No results for input(s): INR, PROTIME in the last 168 hours. Cardiac Enzymes:  Recent Labs Lab 02/10/16 1050 02/10/16 1430 02/10/16 2255 02/11/16 0412  TROPONINI 0.04* 0.05* 0.04* 0.04*   BNP (last 3 results) No results for input(s): PROBNP in the last 8760 hours. HbA1C: No results for input(s): HGBA1C in the last 72 hours. CBG:  Recent Labs Lab 02/10/16 2042 02/11/16 0553 02/11/16 1155  GLUCAP 222* 196* 144*   Lipid Profile: No results for input(s): CHOL, HDL, LDLCALC, TRIG, CHOLHDL, LDLDIRECT in the last 72 hours. Thyroid Function Tests:  Recent Labs  02/10/16 2255  TSH 1.542   Anemia Panel: No results for input(s): VITAMINB12, FOLATE, FERRITIN, TIBC, IRON, RETICCTPCT in the last 72 hours. Urine analysis: No results found for: COLORURINE, APPEARANCEUR, LABSPEC, PHURINE, GLUCOSEU, HGBUR, BILIRUBINUR, KETONESUR, PROTEINUR, UROBILINOGEN, NITRITE,  [place] PartyLEUKOCYTESUR   RAI,RIPUDEEP M.D. [facility] 02/11/2016, 12:02 PM  Pager: [phone] Between 7am to 7pm - call Pager - [phone]  After 7pm go to [email or web address] - password [email or web address]  Call night coverage person covering after 7pm

## 2016-02-11 NOTE — Care Management Note (Signed)
Case Management Note  Patient Details  Name: DESTIN JONASSON MRN: [id number] Date of Birth: April 25, 1941  Subjective/Objective:  Admitted with Acute CHF                  Action/Plan: Patient lives at home with spouse; PCP is Dr Danise Edge; has private insurance with Medicare/ BCBS with prescription drug coverage; pharmacy of choice is [facility]; patient reports no problem getting his medication. He has scales and knows to weigh himself daily; eats a heart healthy diet low in sodium. No DME; No needs identified at this time; CM will continue to follow for DCP  Expected Discharge Date:    possibly 02/14/2016              Expected Discharge Plan:  Home/Self Care  Discharge planning Services  CM Consult  Status of Service:  In process, will continue to follow  Reola Mosher [phone] 02/11/2016, 11:07 AM

## 2016-02-11 NOTE — Progress Notes (Signed)
Received order from Dr. Isidoro Donning  For coreg . Will monitor. Hr remained tachycardic

## 2016-02-11 NOTE — Progress Notes (Signed)
1725 apresoline 10 mg given as PRN for bps 160 as orderd.will moniter

## 2016-02-12 ENCOUNTER — Inpatient Hospital Stay (HOSPITAL_COMMUNITY): Payer: Medicare Other

## 2016-02-12 DIAGNOSIS — I160 Hypertensive urgency: Secondary | ICD-10-CM

## 2016-02-12 DIAGNOSIS — I509 Heart failure, unspecified: Secondary | ICD-10-CM

## 2016-02-12 DIAGNOSIS — R609 Edema, unspecified: Secondary | ICD-10-CM

## 2016-02-12 DIAGNOSIS — R0602 Shortness of breath: Secondary | ICD-10-CM

## 2016-02-12 DIAGNOSIS — I119 Hypertensive heart disease without heart failure: Secondary | ICD-10-CM

## 2016-02-12 DIAGNOSIS — R0600 Dyspnea, unspecified: Secondary | ICD-10-CM

## 2016-02-12 LAB — BASIC METABOLIC PANEL
Anion gap: 8 (ref 5–15)
BUN: 16 mg/dL (ref 6–20)
CALCIUM: 9.4 mg/dL (ref 8.9–10.3)
CO2: 31 mmol/L (ref 22–32)
Chloride: 96 mmol/L — ABNORMAL LOW (ref 101–111)
Creatinine, Ser: 1.18 mg/dL (ref 0.61–1.24)
GFR calc Af Amer: >60 mL/min (ref >60)
GFR, EST NON AFRICAN AMERICAN: 59 mL/min — AB (ref >60)
GLUCOSE: 251 mg/dL — AB (ref 65–99)
Potassium: 4 mmol/L (ref 3.5–5.1)
SODIUM: 135 mmol/L (ref 135–145)

## 2016-02-12 LAB — GLUCOSE, CAPILLARY
GLUCOSE-CAPILLARY: 211 mg/dL — AB (ref 65–99)
GLUCOSE-CAPILLARY: 209 mg/dL — AB (ref 65–99)
Glucose-Capillary: 177 mg/dL — ABNORMAL HIGH (ref 65–99)
Glucose-Capillary: 172 mg/dL — ABNORMAL HIGH (ref 65–99)

## 2016-02-12 LAB — HEMOGLOBIN A1C
HEMOGLOBIN A1C: 8.3 % — AB (ref 4.8–5.6)
MEAN PLASMA GLUCOSE: 192 mg/dL

## 2016-02-12 LAB — ECHOCARDIOGRAM COMPLETE
HEIGHTINCHES: 72 in
Weight: 4361.6 oz

## 2016-02-12 MED ORDER — LORATADINE 10 MG PO TABS
10.0000 mg | ORAL_TABLET | Freq: Every day | ORAL | Status: DC
  Administered 2016-02-12 – 2016-02-13 (×2): 10 mg via ORAL
  Filled 2016-02-12 (×2): qty 1

## 2016-02-12 MED ORDER — PERFLUTREN LIPID MICROSPHERE
1.0000 mL | INTRAVENOUS | Status: AC | PRN
  Administered 2016-02-12: 1 mL via INTRAVENOUS
  Filled 2016-02-12: qty 10

## 2016-02-12 MED ORDER — PERFLUTREN LIPID MICROSPHERE
INTRAVENOUS | Status: AC
  Administered 2016-02-12: 1 mL via INTRAVENOUS
  Filled 2016-02-12: qty 10

## 2016-02-12 MED ORDER — INSULIN GLARGINE 100 UNIT/ML ~~LOC~~ SOLN
10.0000 [IU] | Freq: Every day | SUBCUTANEOUS | Status: DC
  Administered 2016-02-12: 10 [IU] via SUBCUTANEOUS
  Filled 2016-02-12 (×2): qty 0.1

## 2016-02-12 NOTE — Progress Notes (Signed)
  Echocardiogram 2D Echocardiogram with Definity has been performed.  Leta Jungling M 02/12/2016, 10:44 AM

## 2016-02-12 NOTE — Progress Notes (Signed)
Patient Name: David BealsCharles E Huynh Date of Encounter: 02/12/2016  Hospital Problem List     Principal Problem:   Acute CHF (congestive heart failure) (HCC) Active Problems:   Obesity due to excess calories   Type 2 diabetes mellitus without complication, without long-term current use of insulin (HCC)   Mild persistent asthma   Essential hypertension    Patient Profile     Mr. David GrinderCarlson is a 75 year old male with a past medical history of asthma, DM, HLD, HTN, and obesity. He presented to the ED on 02/10/16 with cough and BLE edema.   Subjective   Breathing better but not quite at baseline.   Inpatient Medications    . amLODipine  10 mg Oral Daily  . aspirin EC  81 mg Oral Daily  . carvedilol  6.25 mg Oral BID WC  . enoxaparin (LOVENOX) injection  60 mg Subcutaneous Daily  . furosemide  40 mg Intravenous BID  . lisinopril  40 mg Oral Daily   And  . hydrochlorothiazide  25 mg Oral Daily  . insulin aspart  0-15 Units Subcutaneous TID WC  . insulin aspart  0-5 Units Subcutaneous QHS  . mometasone-formoterol  2 puff Inhalation BID  . potassium chloride  40 mEq Oral BID  . pravastatin  20 mg Oral q1800    Vital Signs    Vitals:   02/11/16 2006 02/12/16 0030 02/12/16 0514 02/12/16 0819  BP: (!) 149/75 (!) 129/91 (!) 146/91   Pulse: (!) 101 93 93   Resp: 18 20 18    Temp: 97.5 F (36.4 C) 97.9 F (36.6 C) 97.6 F (36.4 C)   TempSrc: Oral Oral Oral   SpO2: 100% 100% 100% 98%  Weight:   272 lb 9.6 oz (123.7 kg)   Height:        Intake/Output Summary (Last 24 hours) at 02/12/16 1042 Last data filed at 02/12/16 0900  Gross per 24 hour  Intake             1660 ml  Output             3650 ml  Net            -1990 ml   Filed Weights   02/10/16 2030 02/11/16 0425 02/12/16 0514  Weight: 279 lb 11.2 oz (126.9 kg) 278 lb 6.4 oz (126.3 kg) 272 lb 9.6 oz (123.7 kg)    Physical Exam    GEN: Well nourished, well developed, in  no acute distress.  Neck: Supple, no JVD,  carotid bruits, or masses. Cardiac: RRR, no rubs, or gallops. No clubbing, cyanosis, mild3 edema.  Radials/DP/PT 2+ and equal bilaterally.  Respiratory:  Respirations  regular and unlabored, clear to auscultation bilaterally. GI: Soft, nontender, nondistended, BS + x 4. Neuro:  Strength and sensation are intact.   Labs    CBC  Recent Labs  02/10/16 1050  WBC 9.6  NEUTROABS 6.0  HGB 14.8  HCT 44.2  MCV 91.3  PLT 202   Basic Metabolic Panel  Recent Labs  02/10/16 2255 02/11/16 0412 02/12/16 0316  NA  --  139 135  K  --  4.3 4.0  CL  --  104 96*  CO2  --  30 31  GLUCOSE  --  204* 251*  BUN  --  14 16  CREATININE  --  1.02 1.18  CALCIUM  --  9.4 9.4  MG 2.1  --   --    Liver Function  Tests  Recent Labs  02/10/16 1050  AST 28  ALT 37  ALKPHOS 53  BILITOT 1.5*  PROT 7.2  ALBUMIN 4.1   No results for input(s): LIPASE, AMYLASE in the last 72 hours. Cardiac Enzymes  Recent Labs  02/10/16 1430 02/10/16 2255 02/11/16 0412  TROPONINI 0.05* 0.04* 0.04*   BNP Invalid input(s): POCBNP D-Dimer  Recent Labs  02/11/16 0848  DDIMER 1.13*   Hemoglobin A1C  Recent Labs  02/11/16 0412  HGBA1C 8.3*   Fasting Lipid Panel No results for input(s): CHOL, HDL, LDLCALC, TRIG, CHOLHDL, LDLDIRECT in the last 72 hours. Thyroid Function Tests  Recent Labs  02/10/16 2255  TSH 1.542    Telemetry   NSR with ectopy.    ECG    NA  Radiology    Dg Chest 2 View  Result Date: 02/10/2016 CLINICAL DATA:  Cough, chest congestion, and shortness of breath for the past 3 weeks. Previous episode of pneumothorax due to rib fractures. History of asthma. EXAM: CHEST  2 VIEW COMPARISON:  PA and lateral chest x-ray of May 21, 2015 FINDINGS: The lungs are mildly hyperinflated. There is no pneumothorax. The interstitial markings are increased bilaterally. The left hemidiaphragm is partially obscured. There small pleural effusions blunting the posterior costophrenic  angles. The cardiac silhouette is mildly enlarged. The pulmonary vascularity is mildly engorged. There is calcification in the wall of the aortic arch. The bony thorax exhibits no acute abnormality. IMPRESSION: CHF with mild interstitial edema. Trace bilateral pleural effusions. No alveolar pneumonia. Underlying reactive airway disease. Electronically Signed   By: David  [place] M.D.   On: 02/10/2016 11:30   Ct Angio Chest Pe W Or Wo Contrast  Result Date: 02/11/2016 CLINICAL DATA:  Shortness of Breath EXAM: CT ANGIOGRAPHY CHEST WITH CONTRAST TECHNIQUE: Multidetector CT imaging of the chest was performed using the standard protocol during bolus administration of intravenous contrast. Multiplanar CT image reconstructions and MIPs were obtained to evaluate the vascular anatomy. CONTRAST:  80 mL Isovue 370 nonionic COMPARISON:  Chest radiograph February 10, 2016 FINDINGS: Cardiovascular: There is no demonstrable pulmonary embolus. There is prominence of the ascending thoracic aorta with a measured transverse diameter of 4.1 x 3.8 cm. There is no thoracic dissection. There are scattered foci of atherosclerotic calcification in the aorta. The visualized great vessels are normal except for mild proximal atherosclerotic calcification without significant vessel narrowing. There are scattered foci of coronary artery calcification. Pericardium is not thickened. Heart overall is mildly enlarged. There is prominence of the main pulmonary outflow tract measuring 3.2 cm in diameter, suggesting underlying pulmonary arterial hypertension. Mediastinum/Nodes: Thyroid appears unremarkable. There are multiple small mediastinal lymph nodes throughout the mediastinum. There is a lymph node in the right hilum measuring 1.7 x 1.4 cm. There is a lymph node just to the right of the carina anteriorly measuring 1.7 x 1.4 cm. There is a lymph node anterior to the left carina measuring 1.5 x 1.0 cm. There is a sub- carinal lymph node  measuring 2.1 x 1.7 cm. Lungs/Pleura: There are moderate free-flowing pleural effusions bilaterally. There is bibasilar airspace consolidation. There is mild interstitial edema in the lung bases. Upper Abdomen: Visualized upper abdominal structures appear unremarkable. There is a slight degree of reflux into the inferior vena cava but no reflux into the hepatic veins. Musculoskeletal: There is degenerative change in the thoracic spine with diffuse idiopathic skeletal hyperostosis. There is degenerative change in the sternomanubrial joint. There are no blastic or lytic bone lesions. Review of  the MIP images confirms the above findings. IMPRESSION: No demonstrable pulmonary embolus. Prominence of the ascending thoracic aorta with a measured transverse diameter of 4.1 x 3.8 cm. Recommend annual imaging followup by CTA or MRA. This recommendation follows 2010 ACCF/AHA/AATS/ACR/ASA/SCA/SCAI/SIR/STS/SVM Guidelines for the Diagnosis and Management of Patients with Thoracic Aortic Disease. Circulation. 2010; 121: Z610-R604. No thoracic aortic dissection noted. Foci of coronary artery calcification as well as scattered foci of calcification in the aorta and proximal great vessels. Mild reflux of contrast into the inferior vena cava may indicate a degree of increased right heart pressure. There are bilateral pleural effusions with mild cardiomegaly and mild bibasilar interstitial edema. These are findings indicative of a degree of congestive heart failure. There is airspace consolidation in the lung bases, concerning for pneumonia with superimposed atelectasis in the bases. Multiple prominent lymph nodes of uncertain etiology. Given the changes, this mild adenopathy may be of reactive etiology. There is diffuse idiopathic skeletal hyperostosis in the thoracic spine. Electronically Signed   By: Bretta Bang III M.D.   On: 02/11/2016 13:40    Assessment & Plan    ACUTE ON CHRONIC DIASTOLIC HF:  Echo is pending.   Good  UO and weight is down.    Follow BMET.  Continue Lasix today IV.   HTN:  BP is trending down.  Continue current therapy.   HLD:  Continue his current statin resuming his home med when he is discharged.   Signed, Rollene Rotunda, MD  02/12/2016, 10:42 AM

## 2016-02-12 NOTE — Progress Notes (Signed)
[facility]                                                                              Patient Demographics  David Huynh, is a 75 y.o. male, DOB - [birth year], AYT:[id number]  Admit date - 02/10/2016   Admitting Physician Ozella Rocks, MD  Outpatient Primary MD for the patient is Danise Edge, MD  Outpatient specialists:   LOS - 2  days    Chief Complaint  Patient presents with  . Shortness of Breath       Brief summary    David Huynh is a 75 y.o. male with a past medical history significant for HTN, NIDDM who presented with progressive dyspnea for one month. The patient was in his usual state of health until August, when he had "my typical annual August asthma flare". He started back Advair, albuterol, and the chest congestion gradually cleared, but he was left with a feeling of shortness of breath with exertion but did not go away. Now over the last 3 weeks he has continued to have this shortness of breath, progressively worsening, not improved with albuterol. He has also noticed some swelling in his legs and increased abdominal girth and shortness of breath with lying flat especially in the last week after using phenylephrine containing OTC cold medicine for his lingering shortness of breath.Then a night before the admission, at 3 AM he woke up very short of breath, got up and walked around and set at his desk and gradually this improved. He has had no fever, chills, sputum.  In ED, patient was Afebrile, heart rate 110s, respirations 20s, blood pressure 187/115, pulse oximetry normal -BNP 542, Troponin 0.04. He was given IV furosemide and had some improvement in his symptoms.  Assessment & Plan   Acute CHF unknown type:    - Patient with clinical symptoms off peripheral edema, weight gain, orthopnea, PND, BNP elevated 542, troponin 0.04 - Symptoms improving, continue IV Lasix diuresis - Negative balance of 4.3 L, weight down from 288lbs -> 272 lbs     - CT angiogram of the chest negative for PE, Doppler ultrasound of the lower extremities negative for DVT  - Follow 2-D echo   Elevated troponin:  - cardiology following - 2-D echo pending - Continue aspirin  Accelerated hypertension - improving, Continue amlodipine, lisinopril, HCTZ, hydralazine prn   Mild persistent asthma:  -Albuterol when necessary -Continue Dulera   NIDDM:  -Hold metformin -Sliding scale with meals, added lantus  - hemoglobin A1c 8.3  Code Status: Full CODE STATUS DVT Prophylaxis:  Lovenox  Family Communication: Discussed in detail with the patient, all imaging results, lab results explained to the patient  Disposition Plan:   Time Spent in minutes   25 minutes  Procedures:   Consultants:   Cardiology  Antimicrobials:      Medications  Scheduled Meds: . amLODipine  10 mg Oral Daily  . aspirin EC  81 mg Oral Daily  . carvedilol  6.25 mg Oral BID WC  . enoxaparin (LOVENOX) injection  60 mg Subcutaneous Daily  . furosemide  40 mg Intravenous BID  . lisinopril  40 mg Oral Daily   And  . hydrochlorothiazide  25 mg Oral Daily  . insulin aspart  0-15 Units Subcutaneous TID WC  . insulin aspart  0-5 Units Subcutaneous QHS  . mometasone-formoterol  2 puff Inhalation BID  . potassium chloride  40 mEq Oral BID  . pravastatin  20 mg Oral q1800   Continuous Infusions:  PRN Meds:.acetaminophen **OR** acetaminophen, albuterol, hydrALAZINE   Antibiotics   Anti-infectives    None        Subjective:   David Huynh was seen and examined today.  Per patient shortness of breath is improving. Off O2 today. Patient denies dizziness, chest pain, abdominal pain, N/V/D/C, new weakness, numbess, tingling. No acute events overnight.    Objective:   Vitals:   02/12/16 0030 02/12/16 0514 02/12/16 0819 02/12/16 1100  BP: (!) 129/91 (!) 146/91  (!) 141/93  Pulse: 93 93    Resp: 20 18  18   Temp: 97.9 F (36.6 C) 97.6 F (36.4 C)     TempSrc: Oral Oral    SpO2: 100% 100% 98% 97%  Weight:  123.7 kg (272 lb 9.6 oz)    Height:        Intake/Output Summary (Last 24 hours) at 02/12/16 1236 Last data filed at 02/12/16 0900  Gross per 24 hour  Intake             1660 ml  Output             3650 ml  Net            -1990 ml     Wt Readings from Last 3 Encounters:  02/12/16 123.7 kg (272 lb 9.6 oz)  05/21/15 118.5 kg (261 lb 4 oz)  02/24/15 116.6 kg (257 lb)     Exam  General: Alert and oriented x 3, NAD  HEENT:  ,  Neck: Supple, +JVD  Cardiovascular: S1 S2 clear, RRR  Respiratory: Decreased breath sounds at the bases  Gastrointestinal: Obese, Soft, nontender, nondistended, + bowel sounds  Ext: no cyanosis clubbing, 1-2+ edema Rt >Lt  Neuro:no new deficits  Skin: No rashes  Psych: Normal affect and demeanor, alert and oriented x3    Data Reviewed:  I have personally reviewed following labs and imaging studies  Micro Results No results found for this or any previous visit (from the past 240 hour(s)).  Radiology Reports Dg Chest 2 View  Result Date: 02/10/2016 CLINICAL DATA:  Cough, chest congestion, and shortness of breath for the past 3 weeks. Previous episode of pneumothorax due to rib fractures. History of asthma. EXAM: CHEST  2 VIEW COMPARISON:  PA and lateral chest x-ray of May 21, 2015 FINDINGS: The lungs are mildly hyperinflated. There is no pneumothorax. The interstitial markings are increased bilaterally. The left hemidiaphragm is partially obscured. There small pleural effusions blunting the posterior costophrenic angles. The cardiac silhouette is mildly enlarged. The pulmonary vascularity is mildly engorged. There is calcification in the wall of the aortic arch. The bony thorax exhibits no acute abnormality. IMPRESSION: CHF with mild interstitial edema. Trace bilateral pleural effusions. No alveolar pneumonia. Underlying reactive airway disease. Electronically Signed   By: David  [place]  M.D.   On: 02/10/2016 11:30   Ct Angio Chest Pe W Or Wo Contrast  Result Date: 02/11/2016 CLINICAL DATA:  Shortness of Breath EXAM: CT ANGIOGRAPHY CHEST WITH CONTRAST TECHNIQUE: Multidetector CT imaging of the chest was performed using the standard protocol during bolus administration of intravenous contrast. Multiplanar CT  image reconstructions and MIPs were obtained to evaluate the vascular anatomy. CONTRAST:  80 mL Isovue 370 nonionic COMPARISON:  Chest radiograph February 10, 2016 FINDINGS: Cardiovascular: There is no demonstrable pulmonary embolus. There is prominence of the ascending thoracic aorta with a measured transverse diameter of 4.1 x 3.8 cm. There is no thoracic dissection. There are scattered foci of atherosclerotic calcification in the aorta. The visualized great vessels are normal except for mild proximal atherosclerotic calcification without significant vessel narrowing. There are scattered foci of coronary artery calcification. Pericardium is not thickened. Heart overall is mildly enlarged. There is prominence of the main pulmonary outflow tract measuring 3.2 cm in diameter, suggesting underlying pulmonary arterial hypertension. Mediastinum/Nodes: Thyroid appears unremarkable. There are multiple small mediastinal lymph nodes throughout the mediastinum. There is a lymph node in the right hilum measuring 1.7 x 1.4 cm. There is a lymph node just to the right of the carina anteriorly measuring 1.7 x 1.4 cm. There is a lymph node anterior to the left carina measuring 1.5 x 1.0 cm. There is a sub- carinal lymph node measuring 2.1 x 1.7 cm. Lungs/Pleura: There are moderate free-flowing pleural effusions bilaterally. There is bibasilar airspace consolidation. There is mild interstitial edema in the lung bases. Upper Abdomen: Visualized upper abdominal structures appear unremarkable. There is a slight degree of reflux into the inferior vena cava but no reflux into the hepatic veins. Musculoskeletal:  There is degenerative change in the thoracic spine with diffuse idiopathic skeletal hyperostosis. There is degenerative change in the sternomanubrial joint. There are no blastic or lytic bone lesions. Review of the MIP images confirms the above findings. IMPRESSION: No demonstrable pulmonary embolus. Prominence of the ascending thoracic aorta with a measured transverse diameter of 4.1 x 3.8 cm. Recommend annual imaging followup by CTA or MRA. This recommendation follows 2010 ACCF/AHA/AATS/ACR/ASA/SCA/SCAI/SIR/STS/SVM Guidelines for the Diagnosis and Management of Patients with Thoracic Aortic Disease. Circulation. 2010; 121: W098-J191. No thoracic aortic dissection noted. Foci of coronary artery calcification as well as scattered foci of calcification in the aorta and proximal great vessels. Mild reflux of contrast into the inferior vena cava may indicate a degree of increased right heart pressure. There are bilateral pleural effusions with mild cardiomegaly and mild bibasilar interstitial edema. These are findings indicative of a degree of congestive heart failure. There is airspace consolidation in the lung bases, concerning for pneumonia with superimposed atelectasis in the bases. Multiple prominent lymph nodes of uncertain etiology. Given the changes, this mild adenopathy may be of reactive etiology. There is diffuse idiopathic skeletal hyperostosis in the thoracic spine. Electronically Signed   By: Bretta Bang III M.D.   On: 02/11/2016 13:40    Lab Data:  CBC:  Recent Labs Lab 02/10/16 1050  WBC 9.6  NEUTROABS 6.0  HGB 14.8  HCT 44.2  MCV 91.3  PLT 202   Basic Metabolic Panel:  Recent Labs Lab 02/10/16 1050 02/10/16 2255 02/11/16 0412 02/12/16 0316  NA 139  --  139 135  K 3.7  --  4.3 4.0  CL 105  --  104 96*  CO2 28  --  30 31  GLUCOSE 223*  --  204* 251*  BUN 16  --  14 16  CREATININE 0.99  --  1.02 1.18  CALCIUM 9.3  --  9.4 9.4  MG  --  2.1  --   --     GFR: Estimated Creatinine Clearance: 74.6 mL/min (by C-G formula based on SCr of 1.18 mg/dL). Liver Function  Tests:  Recent Labs Lab 02/10/16 1050  AST 28  ALT 37  ALKPHOS 53  BILITOT 1.5*  PROT 7.2  ALBUMIN 4.1   No results for input(s): LIPASE, AMYLASE in the last 168 hours. No results for input(s): AMMONIA in the last 168 hours. Coagulation Profile: No results for input(s): INR, PROTIME in the last 168 hours. Cardiac Enzymes:  Recent Labs Lab 02/10/16 1050 02/10/16 1430 02/10/16 2255 02/11/16 0412  TROPONINI 0.04* 0.05* 0.04* 0.04*   BNP (last 3 results) No results for input(s): PROBNP in the last 8760 hours. HbA1C:  Recent Labs  02/11/16 0412  HGBA1C 8.3*   CBG:  Recent Labs Lab 02/11/16 1155 02/11/16 1628 02/11/16 2055 02/12/16 0518 02/12/16 1147  GLUCAP 144* 164* 182* 211* 209*   Lipid Profile: No results for input(s): CHOL, HDL, LDLCALC, TRIG, CHOLHDL, LDLDIRECT in the last 72 hours. Thyroid Function Tests:  Recent Labs  02/10/16 2255  TSH 1.542   Anemia Panel: No results for input(s): VITAMINB12, FOLATE, FERRITIN, TIBC, IRON, RETICCTPCT in the last 72 hours. Urine analysis: No results found for: COLORURINE, APPEARANCEUR, LABSPEC, PHURINE, GLUCOSEU, HGBUR, BILIRUBINUR, KETONESUR, PROTEINUR, UROBILINOGEN, NITRITE, [place] PartyLEUKOCYTESUR   RAI,RIPUDEEP M.D. [facility] 02/12/2016, 12:36 PM  Pager: [phone] Between 7am to 7pm - call Pager - [phone]  After 7pm go to [email or web address] - password [email or web address]  Call night coverage person covering after 7pm

## 2016-02-12 NOTE — Progress Notes (Addendum)
VASCULAR LAB PRELIMINARY  PRELIMINARY  PRELIMINARY  PRELIMINARY  Bilateral lower extremity venous duplex completed.    Preliminary report:  There is no DVT or SVT noted in the bilateral lower extremities. There is a Baker's cyst noted in the left lower extremity.   Greta Yung, RVT 02/12/2016, 9:22 AM

## 2016-02-13 DIAGNOSIS — I5021 Acute systolic (congestive) heart failure: Secondary | ICD-10-CM

## 2016-02-13 HISTORY — PX: TRANSTHORACIC ECHOCARDIOGRAM: SHX275

## 2016-02-13 LAB — BASIC METABOLIC PANEL
ANION GAP: 7 (ref 5–15)
BUN: 21 mg/dL — ABNORMAL HIGH (ref 6–20)
CALCIUM: 9.5 mg/dL (ref 8.9–10.3)
CO2: 31 mmol/L (ref 22–32)
CREATININE: 1.24 mg/dL (ref 0.61–1.24)
Chloride: 97 mmol/L — ABNORMAL LOW (ref 101–111)
GFR, EST NON AFRICAN AMERICAN: 56 mL/min — AB (ref >60)
GLUCOSE: 215 mg/dL — AB (ref 65–99)
Potassium: 4.5 mmol/L (ref 3.5–5.1)
Sodium: 135 mmol/L (ref 135–145)

## 2016-02-13 LAB — GLUCOSE, CAPILLARY
Glucose-Capillary: 193 mg/dL — ABNORMAL HIGH (ref 65–99)
Glucose-Capillary: 163 mg/dL — ABNORMAL HIGH (ref 65–99)

## 2016-02-13 MED ORDER — AMLODIPINE BESYLATE 10 MG PO TABS: 10.0000 mg | ORAL_TABLET | Freq: Every day | ORAL | 3 refills | Status: DC

## 2016-02-13 MED ORDER — ASPIRIN 81 MG PO TBEC: 81.0000 mg | DELAYED_RELEASE_TABLET | Freq: Every day | ORAL | 3 refills | Status: DC

## 2016-02-13 MED ORDER — FUROSEMIDE 40 MG PO TABS: 40.0000 mg | ORAL_TABLET | Freq: Every day | ORAL | 3 refills | Status: DC

## 2016-02-13 MED ORDER — GLIMEPIRIDE 4 MG PO TABS: 4.0000 mg | ORAL_TABLET | Freq: Every day | ORAL | 3 refills | Status: DC

## 2016-02-13 MED ORDER — POTASSIUM CHLORIDE CRYS ER 20 MEQ PO TBCR: 40.0000 meq | EXTENDED_RELEASE_TABLET | Freq: Every day | ORAL | Status: DC

## 2016-02-13 MED ORDER — POTASSIUM CHLORIDE CRYS ER 20 MEQ PO TBCR: 20.0000 meq | EXTENDED_RELEASE_TABLET | Freq: Every day | ORAL | 1 refills | Status: DC

## 2016-02-13 MED ORDER — CARVEDILOL 6.25 MG PO TABS: 6.2500 mg | ORAL_TABLET | Freq: Two times a day (BID) | ORAL | 3 refills | Status: DC

## 2016-02-13 MED ORDER — LISINOPRIL 40 MG PO TABS: 40.0000 mg | ORAL_TABLET | Freq: Every day | ORAL | 3 refills | Status: DC

## 2016-02-13 MED ORDER — POTASSIUM CHLORIDE CRYS ER 20 MEQ PO TBCR: 40.0000 meq | EXTENDED_RELEASE_TABLET | Freq: Every day | ORAL | 1 refills | Status: DC

## 2016-02-13 MED ORDER — IPRATROPIUM-ALBUTEROL 0.5-2.5 (3) MG/3ML IN SOLN
RESPIRATORY_TRACT | Status: AC
  Filled 2016-02-13: qty 3

## 2016-02-13 NOTE — Progress Notes (Addendum)
Patient Name: David Huynh Date of Encounter: 02/13/2016  Hospital Problem List     Principal Problem:   Acute systolic HF (heart failure) (HCC) Active Problems:   Obesity due to excess calories   Type 2 diabetes mellitus without complication, without long-term current use of insulin (HCC)   Mild persistent asthma   Essential hypertension   Dyspnea   Hypertensive urgency    Patient Profile     David Huynh is a 75 year old male with a past medical history of asthma, DM, HLD, HTN, and obesity. He presented to the ED on 02/10/16 with cough and BLE edema.   Subjective   No chest pain and breathing OK.    Inpatient Medications    . amLODipine  10 mg Oral Daily  . aspirin EC  81 mg Oral Daily  . carvedilol  6.25 mg Oral BID WC  . enoxaparin (LOVENOX) injection  60 mg Subcutaneous Daily  . furosemide  40 mg Intravenous BID  . insulin aspart  0-15 Units Subcutaneous TID WC  . insulin aspart  0-5 Units Subcutaneous QHS  . insulin glargine  10 Units Subcutaneous QHS  . lisinopril  40 mg Oral Daily  . loratadine  10 mg Oral Daily  . mometasone-formoterol  2 puff Inhalation BID  . potassium chloride  40 mEq Oral BID  . pravastatin  20 mg Oral q1800    Vital Signs    Vitals:   02/13/16 0300 02/13/16 0502 02/13/16 0803 02/13/16 0847  BP:  (!) 144/96 (!) 142/97   Pulse:  94 97   Resp:  18 18   Temp:  97.4 F (36.3 C) 97.8 F (36.6 C)   TempSrc:  Oral Oral   SpO2:  94% 100% 99%  Weight: 271 lb 13.2 oz (123.3 kg)     Height:        Intake/Output Summary (Last 24 hours) at 02/13/16 0855 Last data filed at 02/12/16 2237  Gross per 24 hour  Intake              960 ml  Output             2650 ml  Net            -1690 ml   Filed Weights   02/11/16 0425 02/12/16 0514 02/13/16 0300  Weight: 278 lb 6.4 oz (126.3 kg) 272 lb 9.6 oz (123.7 kg) 271 lb 13.2 oz (123.3 kg)    Physical Exam    GEN: Well nourished, well developed, in  no acute distress.  Neck: Supple, no  JVD, carotid bruits, or masses. Cardiac: RRR, no rubs, or gallops. No clubbing, cyanosis, mild edema.  Radials/DP/PT 2+ and equal bilaterally.  Respiratory:  Respirations  regular and unlabored, clear to auscultation bilaterally. GI: Soft, nontender, nondistended, BS + x 4. Neuro:  Strength and sensation are intact.   Labs    CBC  Recent Labs  02/10/16 1050  WBC 9.6  NEUTROABS 6.0  HGB 14.8  HCT 44.2  MCV 91.3  PLT 202   Basic Metabolic Panel  Recent Labs  02/10/16 2255  02/12/16 0316 02/13/16 0403  NA  --   < > 135 135  K  --   < > 4.0 4.5  CL  --   < > 96* 97*  CO2  --   < > 31 31  GLUCOSE  --   < > 251* 215*  BUN  --   < > 16  21*  CREATININE  --   < > 1.18 1.24  CALCIUM  --   < > 9.4 9.5  MG 2.1  --   --   --   < > = values in this interval not displayed. Liver Function Tests  Recent Labs  02/10/16 1050  AST 28  ALT 37  ALKPHOS 53  BILITOT 1.5*  PROT 7.2  ALBUMIN 4.1   No results for input(s): LIPASE, AMYLASE in the last 72 hours. Cardiac Enzymes  Recent Labs  02/10/16 1430 02/10/16 2255 02/11/16 0412  TROPONINI 0.05* 0.04* 0.04*   BNP Invalid input(s): POCBNP D-Dimer  Recent Labs  02/11/16 0848  DDIMER 1.13*   Hemoglobin A1C  Recent Labs  02/11/16 0412  HGBA1C 8.3*   Fasting Lipid Panel No results for input(s): CHOL, HDL, LDLCALC, TRIG, CHOLHDL, LDLDIRECT in the last 72 hours. Thyroid Function Tests  Recent Labs  02/10/16 2255  TSH 1.542    Telemetry   NSR with ectopy.    ECHO    - Left ventricle: The cavity size was normal. Wall thickness was   increased in a pattern of mild LVH. Systolic function was   moderately reduced. The estimated ejection fraction was in the   range of 35% to 40%. Diffuse hypokinesis. Diastolic function is   abnormal, indeterminate grade. There is evidence of elevated LA   pressure. - Aortic valve: Mildly calcified annulus. Mildly thickened   leaflets. There was mild stenosis. Mean gradient  (S): 10 mm Hg.   Valve area (VTI): 1.51 cm^2. - Mitral valve: Mildly calcified annulus. Mildly thickened leaflets   . There was mild regurgitation. - Left atrium: The atrium was mildly dilated. - Atrial septum: No defect or patent foramen ovale was identified. - Pulmonary arteries: Systolic pressure was mildly to moderately   increased. PA peak pressure: 39 mm Hg (S). - Technically difficult study. Echocontrast was used to enhance   visualization.   Radiology    Dg Chest 2 View  Result Date: 02/10/2016 CLINICAL DATA:  Cough, chest congestion, and shortness of breath for the past 3 weeks. Previous episode of pneumothorax due to rib fractures. History of asthma. EXAM: CHEST  2 VIEW COMPARISON:  PA and lateral chest x-ray of May 21, 2015 FINDINGS: The lungs are mildly hyperinflated. There is no pneumothorax. The interstitial markings are increased bilaterally. The left hemidiaphragm is partially obscured. There small pleural effusions blunting the posterior costophrenic angles. The cardiac silhouette is mildly enlarged. The pulmonary vascularity is mildly engorged. There is calcification in the wall of the aortic arch. The bony thorax exhibits no acute abnormality. IMPRESSION: CHF with mild interstitial edema. Trace bilateral pleural effusions. No alveolar pneumonia. Underlying reactive airway disease. Electronically Signed   By: David  Huynh M.D.   On: 02/10/2016 11:30   Ct Angio Chest Pe W Or Wo Contrast  Result Date: 02/11/2016 CLINICAL DATA:  Shortness of Breath EXAM: CT ANGIOGRAPHY CHEST WITH CONTRAST TECHNIQUE: Multidetector CT imaging of the chest was performed using the standard protocol during bolus administration of intravenous contrast. Multiplanar CT image reconstructions and MIPs were obtained to evaluate the vascular anatomy. CONTRAST:  80 mL Isovue 370 nonionic COMPARISON:  Chest radiograph February 10, 2016 FINDINGS: Cardiovascular: There is no demonstrable pulmonary embolus.  There is prominence of the ascending thoracic aorta with a measured transverse diameter of 4.1 x 3.8 cm. There is no thoracic dissection. There are scattered foci of atherosclerotic calcification in the aorta. The visualized great vessels are normal except  for mild proximal atherosclerotic calcification without significant vessel narrowing. There are scattered foci of coronary artery calcification. Pericardium is not thickened. Heart overall is mildly enlarged. There is prominence of the main pulmonary outflow tract measuring 3.2 cm in diameter, suggesting underlying pulmonary arterial hypertension. Mediastinum/Nodes: Thyroid appears unremarkable. There are multiple small mediastinal lymph nodes throughout the mediastinum. There is a lymph node in the right hilum measuring 1.7 x 1.4 cm. There is a lymph node just to the right of the carina anteriorly measuring 1.7 x 1.4 cm. There is a lymph node anterior to the left carina measuring 1.5 x 1.0 cm. There is a sub- carinal lymph node measuring 2.1 x 1.7 cm. Lungs/Pleura: There are moderate free-flowing pleural effusions bilaterally. There is bibasilar airspace consolidation. There is mild interstitial edema in the lung bases. Upper Abdomen: Visualized upper abdominal structures appear unremarkable. There is a slight degree of reflux into the inferior vena cava but no reflux into the hepatic veins. Musculoskeletal: There is degenerative change in the thoracic spine with diffuse idiopathic skeletal hyperostosis. There is degenerative change in the sternomanubrial joint. There are no blastic or lytic bone lesions. Review of the MIP images confirms the above findings. IMPRESSION: No demonstrable pulmonary embolus. Prominence of the ascending thoracic aorta with a measured transverse diameter of 4.1 x 3.8 cm. Recommend annual imaging followup by CTA or MRA. This recommendation follows 2010 ACCF/AHA/AATS/ACR/ASA/SCA/SCAI/SIR/STS/SVM Guidelines for the Diagnosis and  Management of Patients with Thoracic Aortic Disease. Circulation. 2010; 121: Z610-R604. No thoracic aortic dissection noted. Foci of coronary artery calcification as well as scattered foci of calcification in the aorta and proximal great vessels. Mild reflux of contrast into the inferior vena cava may indicate a degree of increased right heart pressure. There are bilateral pleural effusions with mild cardiomegaly and mild bibasilar interstitial edema. These are findings indicative of a degree of congestive heart failure. There is airspace consolidation in the lung bases, concerning for pneumonia with superimposed atelectasis in the bases. Multiple prominent lymph nodes of uncertain etiology. Given the changes, this mild adenopathy may be of reactive etiology. There is diffuse idiopathic skeletal hyperostosis in the thoracic spine. Electronically Signed   By: Bretta Bang III M.D.   On: 02/11/2016 13:40    Assessment & Plan    ACUTE SYSTOLIC HF:  Echo as above  Good UO and weight is down.  Cardiomyopathy new.  High pretest probability of obstructive CAD as an etiology.  He needs an elective cardiac cath.  I think that it is OK to send him home and get this done this week as an out patient.  The patient understands that risks included but are not limited to stroke (1 in 1000), death (1 in 1000), kidney failure [usually temporary] (1 in 500), bleeding (1 in 200), allergic reaction [possibly serious] (1 in 200).  The patient understands and agrees to proceed.   We will work on the logistics.  I think that he could go home on Lasix 40 mg daily but needs 48 ounce fluid restriction and very low salt.  Daily weights.  Take an extra 40 mg of Lasix if his weight is up two pounds in one day.  We will contact him about the timing of cath.   (I reviewed his echo films.)  HTN:  This is being managed in the context of treating his CHF.  Continue current ACE, beta blocker  HLD:  Continue his current statin resuming  his home med when he is discharged.  Signed, Rollene RotundaJames Tinslee Klare, MD  02/13/2016, 8:55 AM

## 2016-02-13 NOTE — Progress Notes (Signed)
Patient alert and oriented, denies pain, no c/o shortness breath, v/s stable. Iv and tele d/c. D/c instruction explain and given to the patient, all questions answered, pt. Verbalized understanding. Pt. D/c per order.

## 2016-02-13 NOTE — Discharge Summary (Signed)
Physician Discharge Summary   Patient ID: David Huynh MRN: [id number] DOB/AGE: 1941-01-31 75 y.o.  Admit date: 02/10/2016 Discharge date: 02/13/2016  Primary Care Physician:  David Edge, MD  Discharge Diagnoses:      Acute on chronic combined systolic and diastolic CHF . Mild persistent asthma . Essential hypertension . Obesity     Uncontrolled diabetes mellitus   Consults:  Cardiology  Recommendations for Outpatient Follow-up:  1. Recommended to increase Amaryl to 4 mg daily, continue metformin. Follow-up with PCP with log of blood sugars, if no significant improvement, patient may need insulin at that time. 2. Please repeat CBC/BMET at next visit 3. Started on Lasix 40 mg daily. Plan is for cardiac cath later this week, patient will be called by cardiology office  Fluid restriction 1500 mL per 24 hours, low-salt diet, this was explained in detail to the patient  DIET: Low-salt diet, carb modified diet    Allergies:  No Known Allergies   DISCHARGE MEDICATIONS: Current Discharge Medication List    START taking these medications   Details  aspirin EC 81 MG EC tablet Take 1 tablet (81 mg total) by mouth daily. Qty: 30 tablet, Refills: 3    carvedilol (COREG) 6.25 MG tablet Take 1 tablet (6.25 mg total) by mouth 2 (two) times daily with a meal. Qty: 60 tablet, Refills: 3    furosemide (LASIX) 40 MG tablet Take 1 tablet (40 mg total) by mouth daily. Qty: 30 tablet, Refills: 3    lisinopril (PRINIVIL,ZESTRIL) 40 MG tablet Take 1 tablet (40 mg total) by mouth daily. Qty: 30 tablet, Refills: 3    potassium chloride SA (K-DUR,KLOR-CON) 20 MEQ tablet Take 1 tablet (20 mEq total) by mouth daily. Qty: 30 tablet, Refills: 1      CONTINUE these medications which have CHANGED   Details  amLODipine (NORVASC) 10 MG tablet Take 1 tablet (10 mg total) by mouth daily. Qty: 30 tablet, Refills: 3    glimepiride (AMARYL) 4 MG tablet Take 1 tablet (4 mg total) by mouth  daily with breakfast. Qty: 30 tablet, Refills: 3      CONTINUE these medications which have NOT CHANGED   Details  acetaminophen (TYLENOL) 500 MG tablet Take 500 mg by mouth 3 (three) times daily as needed (pain).    cholecalciferol (VITAMIN D) 1000 units tablet Take 1,000 Units by mouth daily.    fexofenadine (ALLEGRA) 30 MG tablet Take 30 mg by mouth daily.     Fluticasone-Salmeterol (ADVAIR) 100-50 MCG/DOSE AEPB Inhale 1 puff into the lungs 2 (two) times daily. Qty: 1 each, Refills: 3    metFORMIN (GLUCOPHAGE) 1000 MG tablet TAKE 1 TABLET(1000 MG) BY MOUTH TWICE DAILY WITH A MEAL Qty: 180 tablet, Refills: 0    Pitavastatin Calcium (LIVALO) 4 MG TABS Take 1 tablet (4 mg total) by mouth daily. Qty: 30 tablet, Refills: 3    VENTOLIN HFA 108 (90 Base) MCG/ACT inhaler INHALE 2 PUFFS INTO THE LUNGS AS NEEDED Qty: 18 g, Refills: 0   Associated Diagnoses: Type 2 diabetes mellitus with hyperglycemia, without long-term current use of insulin (HCC); Essential hypertension; Hypocalcemia; Hyperlipidemia; Asthma, mild intermittent, uncomplicated      STOP taking these medications     lisinopril-hydrochlorothiazide (PRINZIDE,ZESTORETIC) 20-12.5 MG tablet          Brief H and P: For complete details please refer to admission H and P, but in briefCharles E Carlsonis a 75 y.o.malewith a past medical history significant for HTN, NIDDMwho presented with  progressive dyspnea for one month. The patient was in his usual state of health until August, when he had "my typical annual August asthma flare". He started back Advair, albuterol, and the chest congestion gradually cleared, but he was left with a feeling of shortness of breath with exertion but did not go away. Now over the last 3 weeks he has continued to have this shortness of breath, progressively worsening, not improved with albuterol. He has also noticed some swelling in his legs and increased abdominal girth and shortness of breath with  lying flat especially in the last week after using phenylephrine containing OTC cold medicine for his lingering shortness of breath.Then a night before the admission, at 3 AM he woke up very short of breath, got up and walked around and set at his desk and gradually this improved. He has had no fever, chills, sputum.  In ED, patient was Afebrile, heart rate 110s, respirations 20s, blood pressure 187/115,pulse oximetry normal -BNP 542, Troponin 0.04. He was given IV furosemide and had some improvement in his symptoms.   Hospital Course:  Acute systolic and diastolic CHF - EF 35% to 40%, new cardiomyopathy  Patient with clinical symptoms off peripheral edema, weight gain, orthopnea, PND, BNP elevated 542, troponin slightly elevated - Symptoms improving, patient was started on IV Lasix with significant improvement - Negative balance of 6.0 L, weight down from 288lbs -> 271 lbs at the time of discharge  - CT angiogram of the chest negative for PE, Doppler ultrasound of the lower extremities negative for DVT  - Patient was cleared by cardiology to be discharged home on Lasix 40 mg daily, potassium replacement, ACE inhibitor, Coreg, Norvasc -2-D echo showed EF of 35-40% with diffuse hypokinesis, diastolic function is abnormal. Cardiology recommending cardiac cath, will arrange outpatient, discussed with Dr. Antoine Huynh prior to discharge.    Elevated troponin: - 2-D echo 2-D echo showed EF of 35-40% with diffuse hypokinesis, diastolic function is abnormal. Cardiology recommending cardiac cath, - Continue aspirin, Cardiology to follow outpatient in office and cardiac cath  Accelerated hypertension - improving, Placed on Lasix, Coreg, increased Norvasc and ACE inhibitor. HCTZ discontinued   Mild persistent asthma: -Albuterol when necessary -Continue advair   NIDDM:uncontrolled DM - hemoglobin A1c 8.3 - Continue metformin, Amaryl increased to 4 mg daily. Patient has an appointment in 3 weeks  with his primary care physician and will request referral to endocrinology.    Day of Discharge BP (!) 142/97 (BP Location: Right Arm)   Pulse 97   Temp 97.8 F (36.6 C) (Oral)   Resp 18   Ht 6' (1.829 m)   Wt 123.3 kg (271 lb 13.2 oz)   SpO2 99%   BMI 36.87 kg/m   Physical Exam: General: Alert and awake oriented x3 not in any acute distress. HEENT: anicteric sclera, pupils reactive to light and accommodation CVS: S1-S2 clear no murmur rubs or gallops Chest: clear to auscultation bilaterally, no wheezing rales or rhonchi Abdomen: soft nontender, nondistended, normal bowel sounds Extremities: no cyanosis, clubbing, trace edema noted bilaterally Neuro: Cranial nerves II-XII intact, no focal neurological deficits   The results of significant diagnostics from this hospitalization (including imaging, microbiology, ancillary and laboratory) are listed below for reference.    LAB RESULTS: Basic Metabolic Panel:  Recent Labs Lab 02/10/16 2255  02/12/16 0316 02/13/16 0403  NA  --   < > 135 135  K  --   < > 4.0 4.5  CL  --   < >  96* 97*  CO2  --   < > 31 31  GLUCOSE  --   < > 251* 215*  BUN  --   < > 16 21*  CREATININE  --   < > 1.18 1.24  CALCIUM  --   < > 9.4 9.5  MG 2.1  --   --   --   < > = values in this interval not displayed. Liver Function Tests:  Recent Labs Lab 02/10/16 1050  AST 28  ALT 37  ALKPHOS 53  BILITOT 1.5*  PROT 7.2  ALBUMIN 4.1   No results for input(s): LIPASE, AMYLASE in the last 168 hours. No results for input(s): AMMONIA in the last 168 hours. CBC:  Recent Labs Lab 02/10/16 1050  WBC 9.6  NEUTROABS 6.0  HGB 14.8  HCT 44.2  MCV 91.3  PLT 202   Cardiac Enzymes:  Recent Labs Lab 02/10/16 2255 02/11/16 0412  TROPONINI 0.04* 0.04*   BNP: Invalid input(s): POCBNP CBG:  Recent Labs Lab 02/12/16 2224 02/13/16 0649  GLUCAP 172* 193*    Significant Diagnostic Studies:  Dg Chest 2 View  Result Date: 02/10/2016 CLINICAL  DATA:  Cough, chest congestion, and shortness of breath for the past 3 weeks. Previous episode of pneumothorax due to rib fractures. History of asthma. EXAM: CHEST  2 VIEW COMPARISON:  PA and lateral chest x-ray of May 21, 2015 FINDINGS: The lungs are mildly hyperinflated. There is no pneumothorax. The interstitial markings are increased bilaterally. The left hemidiaphragm is partially obscured. There small pleural effusions blunting the posterior costophrenic angles. The cardiac silhouette is mildly enlarged. The pulmonary vascularity is mildly engorged. There is calcification in the wall of the aortic arch. The bony thorax exhibits no acute abnormality. IMPRESSION: CHF with mild interstitial edema. Trace bilateral pleural effusions. No alveolar pneumonia. Underlying reactive airway disease. Electronically Signed   By: David  [place] M.D.   On: 02/10/2016 11:30   Ct Angio Chest Pe W Or Wo Contrast  Result Date: 02/11/2016 CLINICAL DATA:  Shortness of Breath EXAM: CT ANGIOGRAPHY CHEST WITH CONTRAST TECHNIQUE: Multidetector CT imaging of the chest was performed using the standard protocol during bolus administration of intravenous contrast. Multiplanar CT image reconstructions and MIPs were obtained to evaluate the vascular anatomy. CONTRAST:  80 mL Isovue 370 nonionic COMPARISON:  Chest radiograph February 10, 2016 FINDINGS: Cardiovascular: There is no demonstrable pulmonary embolus. There is prominence of the ascending thoracic aorta with a measured transverse diameter of 4.1 x 3.8 cm. There is no thoracic dissection. There are scattered foci of atherosclerotic calcification in the aorta. The visualized great vessels are normal except for mild proximal atherosclerotic calcification without significant vessel narrowing. There are scattered foci of coronary artery calcification. Pericardium is not thickened. Heart overall is mildly enlarged. There is prominence of the main pulmonary outflow tract measuring 3.2  cm in diameter, suggesting underlying pulmonary arterial hypertension. Mediastinum/Nodes: Thyroid appears unremarkable. There are multiple small mediastinal lymph nodes throughout the mediastinum. There is a lymph node in the right hilum measuring 1.7 x 1.4 cm. There is a lymph node just to the right of the carina anteriorly measuring 1.7 x 1.4 cm. There is a lymph node anterior to the left carina measuring 1.5 x 1.0 cm. There is a sub- carinal lymph node measuring 2.1 x 1.7 cm. Lungs/Pleura: There are moderate free-flowing pleural effusions bilaterally. There is bibasilar airspace consolidation. There is mild interstitial edema in the lung bases. Upper Abdomen: Visualized upper abdominal structures  appear unremarkable. There is a slight degree of reflux into the inferior vena cava but no reflux into the hepatic veins. Musculoskeletal: There is degenerative change in the thoracic spine with diffuse idiopathic skeletal hyperostosis. There is degenerative change in the sternomanubrial joint. There are no blastic or lytic bone lesions. Review of the MIP images confirms the above findings. IMPRESSION: No demonstrable pulmonary embolus. Prominence of the ascending thoracic aorta with a measured transverse diameter of 4.1 x 3.8 cm. Recommend annual imaging followup by CTA or MRA. This recommendation follows 2010 ACCF/AHA/AATS/ACR/ASA/SCA/SCAI/SIR/STS/SVM Guidelines for the Diagnosis and Management of Patients with Thoracic Aortic Disease. Circulation. 2010; 121: Z610-R604. No thoracic aortic dissection noted. Foci of coronary artery calcification as well as scattered foci of calcification in the aorta and proximal great vessels. Mild reflux of contrast into the inferior vena cava may indicate a degree of increased right heart pressure. There are bilateral pleural effusions with mild cardiomegaly and mild bibasilar interstitial edema. These are findings indicative of a degree of congestive heart failure. There is airspace  consolidation in the lung bases, concerning for pneumonia with superimposed atelectasis in the bases. Multiple prominent lymph nodes of uncertain etiology. Given the changes, this mild adenopathy may be of reactive etiology. There is diffuse idiopathic skeletal hyperostosis in the thoracic spine. Electronically Signed   By: Bretta Bang III M.D.   On: 02/11/2016 13:40    2D ECHO: Study Conclusions  - Left ventricle: The cavity size was normal. Wall thickness was   increased in a pattern of mild LVH. Systolic function was   moderately reduced. The estimated ejection fraction was in the   range of 35% to 40%. Diffuse hypokinesis. Diastolic function is   abnormal, indeterminate grade. There is evidence of elevated LA   pressure. - Aortic valve: Mildly calcified annulus. Mildly thickened   leaflets. There was mild stenosis. Mean gradient (S): 10 mm Hg.   Valve area (VTI): 1.51 cm^2. - Mitral valve: Mildly calcified annulus. Mildly thickened leaflets   . There was mild regurgitation. - Left atrium: The atrium was mildly dilated. - Atrial septum: No defect or patent foramen ovale was identified. - Pulmonary arteries: Systolic pressure was mildly to moderately   increased. PA peak pressure: 39 mm Hg (S). - Technically difficult study. Echocontrast was used to enhance   visualization.   Disposition and Follow-up: Discharge Instructions    (HEART FAILURE PATIENTS) Call MD:  Anytime you have any of the following symptoms: 1) 3 pound weight gain in 24 hours or 5 pounds in 1 week 2) shortness of breath, with or without a dry hacking cough 3) swelling in the hands, feet or stomach 4) if you have to sleep on extra pillows at night in order to breathe.    Complete by:  As directed    Diet Carb Modified    Complete by:  As directed    Discharge instructions    Complete by:  As directed    Low-salt diet, 48 ounces (1500cc) fluid restriction in a day/24hrs   It is VERY IMPORTANT that you  follow up with a PCP on a regular basis.  Check your blood glucoses before each meal and at bedtime and maintain a log of your readings.  Bring this log with you when you follow up with your PCP so that he or she can adjust your diabetes medications at your follow up visit.   Increase activity slowly    Complete by:  As directed  DISPOSITION: home    DISCHARGE FOLLOW-UP Follow-up Information    [facility] [address] [facility] .   Specialty:  Cardiology Why:  office will call with date and time of cath. If you did not heard anything by Tuesday afternoon --> please call Contact information: [address], [address] [place] [id number] [phone]       David EdgeBLYTH, STACEY, MD. Schedule an appointment as soon as possible for a visit in 10 day(s).   Specialty:  Family Medicine Why:  Please discuss regarding your diabetes medications Contact information: [address] [place] [id number] [phone]            Time spent on Discharge: 35mins   Signed:   RAI,RIPUDEEP M.D. [facility] 02/13/2016, 11:16 AM Pager: [phone]

## 2016-02-14 ENCOUNTER — Telehealth: Payer: Self-pay | Admitting: Behavioral Health

## 2016-02-14 NOTE — Telephone Encounter (Signed)
Transition Care Management Follow-up Telephone Call Admit date: 02/10/2016 Discharge date: 02/13/2016  Primary Care Physician:  Danise Edge, MD  Discharge Diagnoses:      Acute on chronic combined systolic and diastolic CHF . Mild persistent asthma . Essential hypertension . Obesity     Uncontrolled diabetes mellitus   Consults:  Cardiology  Recommendations for Outpatient Follow-up:  1. Recommended to increase Amaryl to 4 mg daily, continue metformin. Follow-up with PCP with log of blood sugars, if no significant improvement, patient may need insulin at that time. 2. Please repeat CBC/BMET at next visit 3. Started on Lasix 40 mg daily. Plan is for cardiac cath later this week, patient will be called by cardiology office  Fluid restriction 1500 mL per 24 hours, low-salt diet, this was explained in detail to the patient    How have you been since you were released from the hospital? Patient stated, "I'm feeling quite well".   Do you understand why you were in the hospital? yes   Do you understand the discharge instructions? yes   Where were you discharged to? Home   Items Reviewed:  Medications reviewed: yes  Allergies reviewed: yes  Dietary changes reviewed: yes, low salt diet & carb modified diet  Referrals reviewed: yes;   1. Recommended to increase Amaryl to 4 mg daily, continue metformin. Follow-up with PCP with log of blood  sugars, if no significant improvement, patient may need insulin at that time.  2. Please repeat CBC/BMET at next visit  3. Started on Lasix 40 mg daily. Plan is for cardiac cath later this week, patient will be called by cardiology  office   Fluid restriction 1500 mL per 24 hours, low-salt diet, this was explained in detail to the patient  Functional Questionnaire:   Activities of Daily Living (ADLs):   He states they are independent in the following: ambulation, bathing and hygiene, feeding, continence, grooming, toileting and  dressing States they require assistance with the following: None   Any transportation issues/concerns?: no   Any patient concerns? no   Confirmed importance and date/time of follow-up visits scheduled: Patient voiced that he made an appointment online yesterday evening. Attempted to confirm this information, but did not see it listed. Informed patient that it appears that the appointment did not save and offered to reschedule, but he declined stating that "he will double check it on his MyChart".  Confirmed with patient if condition begins to worsen call PCP or go to the ER.  Patient was given the office number and encouraged to call back with question or concerns.  : yes

## 2016-02-15 ENCOUNTER — Telehealth: Payer: Self-pay | Admitting: Cardiology

## 2016-02-15 NOTE — Telephone Encounter (Signed)
Called Mr Haymon concerning his cath. Pt is scheduled for a cardiac cath by Dr Herbie Baltimore on 10/5 @ 12 noon. Pt was instructed to be here at 10 am for a 12 noon case on Thursday Oct 5th. Pt was told not to take his lasix the morning of the cath per Dr Elissa Hefty instruction. Pt was also instructed to be NPO after midnight and to bring a overnight bag in case intervention is needed. Pt verbalized understanding and all questions were answered.

## 2016-02-17 ENCOUNTER — Encounter (HOSPITAL_COMMUNITY): Payer: Self-pay | Admitting: *Deleted

## 2016-02-17 ENCOUNTER — Ambulatory Visit (HOSPITAL_COMMUNITY)
Admission: RE | Admit: 2016-02-17 | Discharge: 2016-02-18 | Disposition: A | Discharge status: Home / Self Care | Payer: Medicare Other | Source: Ambulatory Visit | Attending: Cardiology | Admitting: Cardiology

## 2016-02-17 ENCOUNTER — Encounter (HOSPITAL_COMMUNITY)
Admission: RE | Disposition: A | Discharge status: Home / Self Care | Payer: Self-pay | Source: Ambulatory Visit | Attending: Cardiology

## 2016-02-17 DIAGNOSIS — I420 Dilated cardiomyopathy: Secondary | ICD-10-CM | POA: Insufficient documentation

## 2016-02-17 DIAGNOSIS — Z794 Long term (current) use of insulin: Secondary | ICD-10-CM | POA: Insufficient documentation

## 2016-02-17 DIAGNOSIS — J4530 Mild persistent asthma, uncomplicated: Secondary | ICD-10-CM | POA: Insufficient documentation

## 2016-02-17 DIAGNOSIS — Z7982 Long term (current) use of aspirin: Secondary | ICD-10-CM | POA: Insufficient documentation

## 2016-02-17 DIAGNOSIS — I2510 Atherosclerotic heart disease of native coronary artery without angina pectoris: Secondary | ICD-10-CM

## 2016-02-17 DIAGNOSIS — I2584 Coronary atherosclerosis due to calcified coronary lesion: Secondary | ICD-10-CM | POA: Insufficient documentation

## 2016-02-17 DIAGNOSIS — I110 Hypertensive heart disease with heart failure: Secondary | ICD-10-CM | POA: Diagnosis not present

## 2016-02-17 DIAGNOSIS — Z6833 Body mass index (BMI) 33.0-33.9, adult: Secondary | ICD-10-CM | POA: Diagnosis not present

## 2016-02-17 DIAGNOSIS — E669 Obesity, unspecified: Secondary | ICD-10-CM | POA: Diagnosis not present

## 2016-02-17 DIAGNOSIS — I5043 Acute on chronic combined systolic (congestive) and diastolic (congestive) heart failure: Secondary | ICD-10-CM | POA: Diagnosis not present

## 2016-02-17 DIAGNOSIS — E785 Hyperlipidemia, unspecified: Secondary | ICD-10-CM | POA: Insufficient documentation

## 2016-02-17 DIAGNOSIS — I429 Cardiomyopathy, unspecified: Secondary | ICD-10-CM

## 2016-02-17 DIAGNOSIS — Z955 Presence of coronary angioplasty implant and graft: Secondary | ICD-10-CM

## 2016-02-17 DIAGNOSIS — E119 Type 2 diabetes mellitus without complications: Secondary | ICD-10-CM

## 2016-02-17 DIAGNOSIS — I10 Essential (primary) hypertension: Secondary | ICD-10-CM | POA: Diagnosis present

## 2016-02-17 DIAGNOSIS — I255 Ischemic cardiomyopathy: Secondary | ICD-10-CM | POA: Diagnosis present

## 2016-02-17 HISTORY — PX: CARDIAC CATHETERIZATION: SHX172

## 2016-02-17 LAB — PROTIME-INR
INR: 0.98
PROTHROMBIN TIME: 13 s (ref 11.4–15.2)

## 2016-02-17 LAB — GLUCOSE, CAPILLARY
GLUCOSE-CAPILLARY: 134 mg/dL — AB (ref 65–99)
Glucose-Capillary: 93 mg/dL (ref 65–99)
Glucose-Capillary: 159 mg/dL — ABNORMAL HIGH (ref 65–99)

## 2016-02-17 LAB — POCT ACTIVATED CLOTTING TIME
Activated Clotting Time: 197 s
Activated Clotting Time: 340 s

## 2016-02-17 SURGERY — LEFT HEART CATH AND CORONARY ANGIOGRAPHY

## 2016-02-17 MED ORDER — MIDAZOLAM HCL 2 MG/2ML IJ SOLN
INTRAMUSCULAR | Status: AC
  Filled 2016-02-17: qty 2

## 2016-02-17 MED ORDER — SODIUM CHLORIDE 0.9 % IV SOLN: 250.0000 mL | INTRAVENOUS | Status: DC | PRN

## 2016-02-17 MED ORDER — ASPIRIN EC 81 MG PO TBEC
81.0000 mg | DELAYED_RELEASE_TABLET | Freq: Every day | ORAL | Status: DC
  Administered 2016-02-18: 10:00:00 81 mg via ORAL
  Filled 2016-02-17: qty 1

## 2016-02-17 MED ORDER — MOMETASONE FURO-FORMOTEROL FUM 100-5 MCG/ACT IN AERO
2.0000 | INHALATION_SPRAY | Freq: Two times a day (BID) | RESPIRATORY_TRACT | Status: DC
  Administered 2016-02-17 – 2016-02-18 (×2): 2 via RESPIRATORY_TRACT
  Filled 2016-02-17: qty 8.8

## 2016-02-17 MED ORDER — HEPARIN (PORCINE) IN NACL 2-0.9 UNIT/ML-% IJ SOLN
INTRAMUSCULAR | Status: AC
  Filled 2016-02-17: qty 1500

## 2016-02-17 MED ORDER — ONDANSETRON HCL 4 MG/2ML IJ SOLN: 4.0000 mg | Freq: Four times a day (QID) | INTRAMUSCULAR | Status: DC | PRN

## 2016-02-17 MED ORDER — FENTANYL CITRATE (PF) 100 MCG/2ML IJ SOLN
INTRAMUSCULAR | Status: DC | PRN
  Administered 2016-02-17: 50 ug via INTRAVENOUS
  Administered 2016-02-17: 25 ug via INTRAVENOUS

## 2016-02-17 MED ORDER — VERAPAMIL HCL 2.5 MG/ML IV SOLN
INTRAVENOUS | Status: DC | PRN
  Administered 2016-02-17: 10 mL via INTRA_ARTERIAL

## 2016-02-17 MED ORDER — PRAVASTATIN SODIUM 40 MG PO TABS: 80.0000 mg | ORAL_TABLET | Freq: Every day | ORAL | Status: DC

## 2016-02-17 MED ORDER — HEPARIN (PORCINE) IN NACL 2-0.9 UNIT/ML-% IJ SOLN
INTRAMUSCULAR | Status: DC | PRN
  Administered 2016-02-17: 1500 mL

## 2016-02-17 MED ORDER — SODIUM CHLORIDE 0.9% FLUSH: 3.0000 mL | INTRAVENOUS | Status: DC | PRN

## 2016-02-17 MED ORDER — SODIUM CHLORIDE 0.9% FLUSH: 3.0000 mL | Freq: Two times a day (BID) | INTRAVENOUS | Status: DC

## 2016-02-17 MED ORDER — LIDOCAINE HCL (PF) 1 % IJ SOLN
INTRAMUSCULAR | Status: DC | PRN
  Administered 2016-02-17: 2 mL

## 2016-02-17 MED ORDER — CLOPIDOGREL BISULFATE 300 MG PO TABS
ORAL_TABLET | ORAL | Status: DC | PRN
  Administered 2016-02-17: 300 mg via ORAL

## 2016-02-17 MED ORDER — SODIUM CHLORIDE 0.9 % IV SOLN: INTRAVENOUS | Status: AC

## 2016-02-17 MED ORDER — CARVEDILOL 3.125 MG PO TABS
6.2500 mg | ORAL_TABLET | Freq: Two times a day (BID) | ORAL | Status: DC
  Administered 2016-02-17 – 2016-02-18 (×2): 6.25 mg via ORAL
  Filled 2016-02-17 (×2): qty 2

## 2016-02-17 MED ORDER — IOPAMIDOL (ISOVUE-370) INJECTION 76%
INTRAVENOUS | Status: AC
  Filled 2016-02-17: qty 100

## 2016-02-17 MED ORDER — IOPAMIDOL (ISOVUE-370) INJECTION 76%
INTRAVENOUS | Status: DC | PRN
  Administered 2016-02-17: 220 mL via INTRA_ARTERIAL

## 2016-02-17 MED ORDER — LISINOPRIL 40 MG PO TABS
40.0000 mg | ORAL_TABLET | Freq: Every day | ORAL | Status: DC
  Administered 2016-02-18: 40 mg via ORAL
  Filled 2016-02-17: qty 1

## 2016-02-17 MED ORDER — ALBUTEROL SULFATE (2.5 MG/3ML) 0.083% IN NEBU
2.5000 mg | INHALATION_SOLUTION | RESPIRATORY_TRACT | Status: DC | PRN
  Administered 2016-02-17: 2.5 mg via RESPIRATORY_TRACT
  Filled 2016-02-17 (×2): qty 3

## 2016-02-17 MED ORDER — VERAPAMIL HCL 2.5 MG/ML IV SOLN
INTRAVENOUS | Status: AC
  Filled 2016-02-17: qty 2

## 2016-02-17 MED ORDER — ASPIRIN 81 MG PO CHEW: 81.0000 mg | CHEWABLE_TABLET | ORAL | Status: DC

## 2016-02-17 MED ORDER — MIDAZOLAM HCL 2 MG/2ML IJ SOLN
INTRAMUSCULAR | Status: DC | PRN
  Administered 2016-02-17: 1 mg via INTRAVENOUS
  Administered 2016-02-17: 2 mg via INTRAVENOUS

## 2016-02-17 MED ORDER — LIDOCAINE HCL (PF) 1 % IJ SOLN
INTRAMUSCULAR | Status: AC
  Filled 2016-02-17: qty 30

## 2016-02-17 MED ORDER — SODIUM CHLORIDE 0.9 % IV SOLN
INTRAVENOUS | Status: DC
  Administered 2016-02-17: 11:00:00 via INTRAVENOUS

## 2016-02-17 MED ORDER — HEPARIN SODIUM (PORCINE) 1000 UNIT/ML IJ SOLN
INTRAMUSCULAR | Status: DC | PRN
  Administered 2016-02-17: 6000 [IU] via INTRAVENOUS
  Administered 2016-02-17: 4000 [IU] via INTRAVENOUS
  Administered 2016-02-17: 3000 [IU] via INTRAVENOUS

## 2016-02-17 MED ORDER — FUROSEMIDE 40 MG PO TABS
40.0000 mg | ORAL_TABLET | Freq: Every day | ORAL | Status: DC
  Administered 2016-02-18: 40 mg via ORAL
  Filled 2016-02-17: qty 1

## 2016-02-17 MED ORDER — CLOPIDOGREL BISULFATE 75 MG PO TABS
75.0000 mg | ORAL_TABLET | Freq: Every day | ORAL | Status: DC
  Administered 2016-02-18: 75 mg via ORAL
  Filled 2016-02-17: qty 1

## 2016-02-17 MED ORDER — CLOPIDOGREL BISULFATE 300 MG PO TABS
ORAL_TABLET | ORAL | Status: AC
  Filled 2016-02-17: qty 1

## 2016-02-17 MED ORDER — HEPARIN SODIUM (PORCINE) 1000 UNIT/ML IJ SOLN
INTRAMUSCULAR | Status: AC
  Filled 2016-02-17: qty 2

## 2016-02-17 MED ORDER — FENTANYL CITRATE (PF) 100 MCG/2ML IJ SOLN
INTRAMUSCULAR | Status: AC
  Filled 2016-02-17: qty 2

## 2016-02-17 MED ORDER — AMLODIPINE BESYLATE 10 MG PO TABS
10.0000 mg | ORAL_TABLET | Freq: Every day | ORAL | Status: DC
  Administered 2016-02-18: 10:00:00 10 mg via ORAL
  Filled 2016-02-17: qty 1

## 2016-02-17 MED ORDER — ACETAMINOPHEN 325 MG PO TABS: 650.0000 mg | ORAL_TABLET | ORAL | Status: DC | PRN

## 2016-02-17 MED ORDER — IOPAMIDOL (ISOVUE-370) INJECTION 76%
INTRAVENOUS | Status: AC
  Filled 2016-02-17: qty 50

## 2016-02-17 MED ORDER — CLOPIDOGREL BISULFATE 300 MG PO TABS
ORAL_TABLET | ORAL | Status: AC
Start: 2016-02-17 — End: 2016-02-17
  Filled 2016-02-17: qty 1

## 2016-02-17 SURGICAL SUPPLY — 20 items
BALLN EMERGE MR 2.5X20 (BALLOONS) ×2
BALLN ~~LOC~~ EUPHORA RX 4.0X15 (BALLOONS) ×2
BALLOON EMERGE MR 2.5X20 (BALLOONS) ×1 IMPLANT
BALLOON ~~LOC~~ EUPHORA RX 4.0X15 (BALLOONS) ×1 IMPLANT
CATH HEARTRAIL IKARI 6F IR1.0 (CATHETERS) ×2 IMPLANT
CATH INFINITI 5 FR JL3.5 (CATHETERS) ×2 IMPLANT
CATH INFINITI 5FR ANG PIGTAIL (CATHETERS) ×2 IMPLANT
CATH INFINITI JR4 5F (CATHETERS) ×2 IMPLANT
CATH OPTITORQUE TIG 4.0 5F (CATHETERS) ×2 IMPLANT
DEVICE RAD COMP TR BAND LRG (VASCULAR PRODUCTS) ×2 IMPLANT
GLIDESHEATH SLEND A-KIT 6F 22G (SHEATH) ×2 IMPLANT
KIT ENCORE 26 ADVANTAGE (KITS) ×2 IMPLANT
KIT HEART LEFT (KITS) ×2 IMPLANT
PACK CARDIAC CATHETERIZATION (CUSTOM PROCEDURE TRAY) ×2 IMPLANT
STENT PROMUS PREM MR 3.5X28 (Permanent Stent) ×2 IMPLANT
SYR MEDRAD MARK V 150ML (SYRINGE) ×2 IMPLANT
TRANSDUCER W/STOPCOCK (MISCELLANEOUS) ×2 IMPLANT
TUBING CIL FLEX 10 FLL-RA (TUBING) ×2 IMPLANT
WIRE HI TORQ BMW 190CM (WIRE) ×2 IMPLANT
WIRE SAFE-T 1.5MM-J .035X260CM (WIRE) ×2 IMPLANT

## 2016-02-17 NOTE — Interval H&P Note (Signed)
History and Physical Interval Note:  02/17/2016 12:17 PM  David Huynh  has presented today for surgery, with the diagnosis of cardiomyopathy. He was discharged from his original hospitalization with plans for outpatient catheterization. He now presents for his catheterization. Please see discharge summary for full details.  The various methods of treatment have been discussed with the patient and family. After consideration of risks, benefits and other options for treatment, the patient has consented to  Procedure(s): Left Heart Cath and Coronary Angiography (N/A) With Possible Percutaneous Coronary Intervention as a surgical intervention .  The patient's history has been reviewed, patient examined, no change in status, stable for surgery.  I have reviewed the patient's chart and labs.  Questions were answered to the patient's satisfaction.    Cath Lab Visit (complete for each Cath Lab visit)  Clinical Evaluation Leading to the Procedure:   ACS: No.  Non-ACS:    Anginal Classification: CCS III - CHF Sx  Anti-ischemic medical therapy: Maximal Therapy (2 or more classes of medications)  Non-Invasive Test Results: Intermediate-risk stress test findings: cardiac mortality 1-3%/year - New Dx of Cardiomyopathy with EF ~35%  Prior CABG: No previous CABG   [name]

## 2016-02-17 NOTE — H&P (View-Only) (Signed)
 Patient Name: David Huynh Date of Encounter: 02/13/2016  Hospital Problem List     Principal Problem:   Acute systolic HF (heart failure) (HCC) Active Problems:   Obesity due to excess calories   Type 2 diabetes mellitus without complication, without long-term current use of insulin (HCC)   Mild persistent asthma   Essential hypertension   Dyspnea   Hypertensive urgency    Patient Profile     [name] is a 75 year old male with a past medical history of asthma, DM, HLD, HTN, and obesity. He presented to the ED on 02/10/16 with cough and BLE edema.   Subjective   No chest pain and breathing OK.    Inpatient Medications    . amLODipine  10 mg Oral Daily  . aspirin EC  81 mg Oral Daily  . carvedilol  6.25 mg Oral BID WC  . enoxaparin (LOVENOX) injection  60 mg Subcutaneous Daily  . furosemide  40 mg Intravenous BID  . insulin aspart  0-15 Units Subcutaneous TID WC  . insulin aspart  0-5 Units Subcutaneous QHS  . insulin glargine  10 Units Subcutaneous QHS  . lisinopril  40 mg Oral Daily  . loratadine  10 mg Oral Daily  . mometasone-formoterol  2 puff Inhalation BID  . potassium chloride  40 mEq Oral BID  . pravastatin  20 mg Oral q1800    Vital Signs    Vitals:   02/13/16 0300 02/13/16 0502 02/13/16 0803 02/13/16 0847  BP:  (!) 144/96 (!) 142/97   Pulse:  94 97   Resp:  18 18   Temp:  97.4 F (36.3 C) 97.8 F (36.6 C)   TempSrc:  Oral Oral   SpO2:  94% 100% 99%  Weight: 271 lb 13.2 oz (123.3 kg)     Height:        Intake/Output Summary (Last 24 hours) at 02/13/16 0855 Last data filed at 02/12/16 2237  Gross per 24 hour  Intake              960 ml  Output             2650 ml  Net            -1690 ml   Filed Weights   02/11/16 0425 02/12/16 0514 02/13/16 0300  Weight: 278 lb 6.4 oz (126.3 kg) 272 lb 9.6 oz (123.7 kg) 271 lb 13.2 oz (123.3 kg)    Physical Exam    GEN: Well nourished, well developed, in  no acute distress.  Neck: Supple, no  JVD, carotid bruits, or masses. Cardiac: RRR, no rubs, or gallops. No clubbing, cyanosis, mild edema.  Radials/DP/PT 2+ and equal bilaterally.  Respiratory:  Respirations  regular and unlabored, clear to auscultation bilaterally. GI: Soft, nontender, nondistended, BS + x 4. Neuro:  Strength and sensation are intact.   Labs    CBC  Recent Labs  02/10/16 1050  WBC 9.6  NEUTROABS 6.0  HGB 14.8  HCT 44.2  MCV 91.3  PLT 202   Basic Metabolic Panel  Recent Labs  02/10/16 2255  02/12/16 0316 02/13/16 0403  NA  --   < > 135 135  K  --   < > 4.0 4.5  CL  --   < > 96* 97*  CO2  --   < > 31 31  GLUCOSE  --   < > 251* 215*  BUN  --   < > 16   21*  CREATININE  --   < > 1.18 1.24  CALCIUM  --   < > 9.4 9.5  MG 2.1  --   --   --   < > = values in this interval not displayed. Liver Function Tests  Recent Labs  02/10/16 1050  AST 28  ALT 37  ALKPHOS 53  BILITOT 1.5*  PROT 7.2  ALBUMIN 4.1   No results for input(s): LIPASE, AMYLASE in the last 72 hours. Cardiac Enzymes  Recent Labs  02/10/16 1430 02/10/16 2255 02/11/16 0412  TROPONINI 0.05* 0.04* 0.04*   BNP Invalid input(s): POCBNP D-Dimer  Recent Labs  02/11/16 0848  DDIMER 1.13*   Hemoglobin A1C  Recent Labs  02/11/16 0412  HGBA1C 8.3*   Fasting Lipid Panel No results for input(s): CHOL, HDL, LDLCALC, TRIG, CHOLHDL, LDLDIRECT in the last 72 hours. Thyroid Function Tests  Recent Labs  02/10/16 2255  TSH 1.542    Telemetry   NSR with ectopy.    ECHO    - Left ventricle: The cavity size was normal. Wall thickness was   increased in a pattern of mild LVH. Systolic function was   moderately reduced. The estimated ejection fraction was in the   range of 35% to 40%. Diffuse hypokinesis. Diastolic function is   abnormal, indeterminate grade. There is evidence of elevated LA   pressure. - Aortic valve: Mildly calcified annulus. Mildly thickened   leaflets. There was mild stenosis. Mean gradient  (S): 10 mm Hg.   Valve area (VTI): 1.51 cm^2. - Mitral valve: Mildly calcified annulus. Mildly thickened leaflets   . There was mild regurgitation. - Left atrium: The atrium was mildly dilated. - Atrial septum: No defect or patent foramen ovale was identified. - Pulmonary arteries: Systolic pressure was mildly to moderately   increased. PA peak pressure: 39 mm Hg (S). - Technically difficult study. Echocontrast was used to enhance   visualization.   Radiology    Dg Chest 2 View  Result Date: 02/10/2016 CLINICAL DATA:  Cough, chest congestion, and shortness of breath for the past 3 weeks. Previous episode of pneumothorax due to rib fractures. History of asthma. EXAM: CHEST  2 VIEW COMPARISON:  PA and lateral chest x-ray of May 21, 2015 FINDINGS: The lungs are mildly hyperinflated. There is no pneumothorax. The interstitial markings are increased bilaterally. The left hemidiaphragm is partially obscured. There small pleural effusions blunting the posterior costophrenic angles. The cardiac silhouette is mildly enlarged. The pulmonary vascularity is mildly engorged. There is calcification in the wall of the aortic arch. The bony thorax exhibits no acute abnormality. IMPRESSION: CHF with mild interstitial edema. Trace bilateral pleural effusions. No alveolar pneumonia. Underlying reactive airway disease. Electronically Signed   By: David  Jordan M.D.   On: 02/10/2016 11:30   Ct Angio Chest Pe W Or Wo Contrast  Result Date: 02/11/2016 CLINICAL DATA:  Shortness of Breath EXAM: CT ANGIOGRAPHY CHEST WITH CONTRAST TECHNIQUE: Multidetector CT imaging of the chest was performed using the standard protocol during bolus administration of intravenous contrast. Multiplanar CT image reconstructions and MIPs were obtained to evaluate the vascular anatomy. CONTRAST:  80 mL Isovue 370 nonionic COMPARISON:  Chest radiograph February 10, 2016 FINDINGS: Cardiovascular: There is no demonstrable pulmonary embolus.  There is prominence of the ascending thoracic aorta with a measured transverse diameter of 4.1 x 3.8 cm. There is no thoracic dissection. There are scattered foci of atherosclerotic calcification in the aorta. The visualized great vessels are normal except   for mild proximal atherosclerotic calcification without significant vessel narrowing. There are scattered foci of coronary artery calcification. Pericardium is not thickened. Heart overall is mildly enlarged. There is prominence of the main pulmonary outflow tract measuring 3.2 cm in diameter, suggesting underlying pulmonary arterial hypertension. Mediastinum/Nodes: Thyroid appears unremarkable. There are multiple small mediastinal lymph nodes throughout the mediastinum. There is a lymph node in the right hilum measuring 1.7 x 1.4 cm. There is a lymph node just to the right of the carina anteriorly measuring 1.7 x 1.4 cm. There is a lymph node anterior to the left carina measuring 1.5 x 1.0 cm. There is a sub- carinal lymph node measuring 2.1 x 1.7 cm. Lungs/Pleura: There are moderate free-flowing pleural effusions bilaterally. There is bibasilar airspace consolidation. There is mild interstitial edema in the lung bases. Upper Abdomen: Visualized upper abdominal structures appear unremarkable. There is a slight degree of reflux into the inferior vena cava but no reflux into the hepatic veins. Musculoskeletal: There is degenerative change in the thoracic spine with diffuse idiopathic skeletal hyperostosis. There is degenerative change in the sternomanubrial joint. There are no blastic or lytic bone lesions. Review of the MIP images confirms the above findings. IMPRESSION: No demonstrable pulmonary embolus. Prominence of the ascending thoracic aorta with a measured transverse diameter of 4.1 x 3.8 cm. Recommend annual imaging followup by CTA or MRA. This recommendation follows 2010 ACCF/AHA/AATS/ACR/ASA/SCA/SCAI/SIR/STS/SVM Guidelines for the Diagnosis and  Management of Patients with Thoracic Aortic Disease. Circulation. 2010; 121: e266-e369. No thoracic aortic dissection noted. Foci of coronary artery calcification as well as scattered foci of calcification in the aorta and proximal great vessels. Mild reflux of contrast into the inferior vena cava may indicate a degree of increased right heart pressure. There are bilateral pleural effusions with mild cardiomegaly and mild bibasilar interstitial edema. These are findings indicative of a degree of congestive heart failure. There is airspace consolidation in the lung bases, concerning for pneumonia with superimposed atelectasis in the bases. Multiple prominent lymph nodes of uncertain etiology. Given the changes, this mild adenopathy may be of reactive etiology. There is diffuse idiopathic skeletal hyperostosis in the thoracic spine. Electronically Signed   By: William  Woodruff III M.D.   On: 02/11/2016 13:40    Assessment & Plan    ACUTE SYSTOLIC HF:  Echo as above  Good UO and weight is down.  Cardiomyopathy new.  High pretest probability of obstructive CAD as an etiology.  He needs an elective cardiac cath.  I think that it is OK to send him home and get this done this week as an out patient.  The patient understands that risks included but are not limited to stroke (1 in 1000), death (1 in 1000), kidney failure [usually temporary] (1 in 500), bleeding (1 in 200), allergic reaction [possibly serious] (1 in 200).  The patient understands and agrees to proceed.   We will work on the logistics.  I think that he could go home on Lasix 40 mg daily but needs 48 ounce fluid restriction and very low salt.  Daily weights.  Take an extra 40 mg of Lasix if his weight is up two pounds in one day.  We will contact him about the timing of cath.   (I reviewed his echo films.)  HTN:  This is being managed in the context of treating his CHF.  Continue current ACE, beta blocker  HLD:  Continue his current statin resuming  his home med when he is discharged.     Signed, Shikira Folino, MD  02/13/2016, 8:55 AM  

## 2016-02-17 NOTE — Research (Signed)
[facility] Study Informed Consent   Subject Name: David Huynh  Subject met inclusion and exclusion criteria.  The informed consent form, study requirements and expectations were reviewed with the subject and questions and concerns were addressed prior to the signing of the consent form.  The subject verbalized understanding of the trial requirements.  The subject agreed to participate in the trial and signed the informed consent.  The informed consent was obtained prior to performance of any protocol-specific procedures for the subject.  A copy of the signed informed consent was given to the subject and a copy was placed in the subject's medical record.  Blossom Hoops 02/17/2016, 11:03 AM

## 2016-02-17 NOTE — Care Management Note (Signed)
Case Management Note  Patient Details  Name: JAYEL FLADUNG MRN: [id number] Date of Birth: 1941/04/18  Subjective/Objective:  S/p coronary stent intervention, will be on plavix and asa, NCM will cont to follow for dc needs.                  Action/Plan:   Expected Discharge Date:                  Expected Discharge Plan:  Home/Self Care  In-House Referral:     Discharge planning Services  CM Consult  Post Acute Care Choice:    Choice offered to:     DME Arranged:    DME Agency:     HH Arranged:    HH Agency:     Status of Service:  In process, will continue to follow  If discussed at Long Length of Stay Meetings, dates discussed:    Additional Comments:  Leone Haven, RN 02/17/2016, 6:15 PM

## 2016-02-17 NOTE — Progress Notes (Signed)
TR BAND REMOVAL  LOCATION:    right radial  DEFLATED PER PROTOCOL:    Yes.    TIME BAND OFF / DRESSING APPLIED:    1830   SITE UPON ARRIVAL:    Level 0  SITE AFTER BAND REMOVAL:    Level 0  CIRCULATION SENSATION AND MOVEMENT:    Within Normal Limits   Yes.    COMMENTS:   Rechecked site at 1900 with no change in assessment  

## 2016-02-18 ENCOUNTER — Encounter (HOSPITAL_COMMUNITY): Payer: Self-pay | Admitting: Cardiology

## 2016-02-18 ENCOUNTER — Telehealth: Payer: Self-pay | Admitting: Physician Assistant

## 2016-02-18 DIAGNOSIS — I2584 Coronary atherosclerosis due to calcified coronary lesion: Secondary | ICD-10-CM | POA: Diagnosis not present

## 2016-02-18 DIAGNOSIS — I420 Dilated cardiomyopathy: Secondary | ICD-10-CM | POA: Diagnosis not present

## 2016-02-18 DIAGNOSIS — I110 Hypertensive heart disease with heart failure: Secondary | ICD-10-CM | POA: Diagnosis not present

## 2016-02-18 DIAGNOSIS — I2510 Atherosclerotic heart disease of native coronary artery without angina pectoris: Secondary | ICD-10-CM | POA: Diagnosis not present

## 2016-02-18 DIAGNOSIS — I5043 Acute on chronic combined systolic (congestive) and diastolic (congestive) heart failure: Secondary | ICD-10-CM | POA: Diagnosis not present

## 2016-02-18 DIAGNOSIS — E119 Type 2 diabetes mellitus without complications: Secondary | ICD-10-CM | POA: Diagnosis not present

## 2016-02-18 LAB — CBC
HEMATOCRIT: 43.4 % (ref 39.0–52.0)
HEMOGLOBIN: 13.9 g/dL (ref 13.0–17.0)
MCH: 29.8 pg (ref 26.0–34.0)
MCHC: 32 g/dL (ref 30.0–36.0)
MCV: 93.1 fL (ref 78.0–100.0)
Platelets: 167 10*3/uL (ref 150–400)
RBC: 4.66 MIL/uL (ref 4.22–5.81)
RDW: 12.7 % (ref 11.5–15.5)
WBC: 7.7 10*3/uL (ref 4.0–10.5)

## 2016-02-18 LAB — BASIC METABOLIC PANEL
Anion gap: 10 (ref 5–15)
BUN: 17 mg/dL (ref 6–20)
CALCIUM: 9.1 mg/dL (ref 8.9–10.3)
CO2: 25 mmol/L (ref 22–32)
Chloride: 102 mmol/L (ref 101–111)
Creatinine, Ser: 0.99 mg/dL (ref 0.61–1.24)
GFR calc Af Amer: >60 mL/min (ref >60)
GLUCOSE: 127 mg/dL — AB (ref 65–99)
Potassium: 3.8 mmol/L (ref 3.5–5.1)
Sodium: 137 mmol/L (ref 135–145)

## 2016-02-18 LAB — GLUCOSE, CAPILLARY: Glucose-Capillary: 134 mg/dL — ABNORMAL HIGH (ref 65–99)

## 2016-02-18 LAB — PLATELET INHIBITION P2Y12: PLATELET FUNCTION P2Y12: 222 [PRU] (ref 194–418)

## 2016-02-18 MED ORDER — NITROGLYCERIN 0.4 MG SL SUBL: 0.4000 mg | SUBLINGUAL_TABLET | SUBLINGUAL | 2 refills | Status: DC | PRN

## 2016-02-18 MED ORDER — CLOPIDOGREL BISULFATE 75 MG PO TABS: 75.0000 mg | ORAL_TABLET | Freq: Every day | ORAL | 12 refills | Status: DC

## 2016-02-18 NOTE — Progress Notes (Signed)
Patient had 6 beats of VT, patient sleeping asymptomatic.  Will continue to monitor.

## 2016-02-18 NOTE — Telephone Encounter (Signed)
New message      TCM appt on 02-25-16 with Azalee Course per Suzzette Righter.

## 2016-02-18 NOTE — Progress Notes (Signed)
CARDIAC REHAB PHASE I   PRE:  Rate/Rhythm: 94 SR with PVC    BP: sitting 141/77    SaO2:   MODE:  Ambulation: 360 ft   POST:  Rate/Rhythm: 105 ST    BP: sitting 165/90     SaO2:   Pt steady, became tired/DOE at 180 ft. Declined rest but wanted to return to room. Sts this is more SOB than his norm. Has not had breathing tx. Encouraged increasing ex/walking at home and good communication with cardiology. Understands importance of Plavix/asa. He has HF booklet from last week, we reviewed this. Will send referral to [facility].  [id number]   Harriet Masson CES, ACSM 02/18/2016 8:56 AM

## 2016-02-18 NOTE — Progress Notes (Signed)
Patient Name: David Huynh Date of Encounter: 02/18/2016  Primary Cardiologist: Dr. Kristeen Huynh   Hospital Problem List     Principal Problem:   Congestive dilated cardiomyopathy (HCC) - EF ~35% Active Problems:   Obesity due to excess calories   Type 2 diabetes mellitus without complication, without long-term current use of insulin (HCC)   Essential hypertension   Combined systolic and diastolic HF (heart failure) (HCC) - SubAcute   Cardiomyopathy (HCC)     Subjective   Feels well, denies chest pain and SOB.   Inpatient Medications    Scheduled Meds: . amLODipine  10 mg Oral Daily  . aspirin EC  81 mg Oral Daily  . carvedilol  6.25 mg Oral BID WC  . clopidogrel  75 mg Oral Q breakfast  . furosemide  40 mg Oral Daily  . lisinopril  40 mg Oral Daily  . mometasone-formoterol  2 puff Inhalation BID  . pravastatin  80 mg Oral q1800  . sodium chloride flush  3 mL Intravenous Q12H   Continuous Infusions:  PRN Meds:.sodium chloride, acetaminophen, albuterol, ondansetron (ZOFRAN) IV, sodium chloride flush   Vital Signs    Vitals:   02/17/16 1919 02/17/16 2035 02/17/16 2315 02/18/16 0400  BP: 120/77  114/74 110/86  Pulse:    81  Resp: 18  18   Temp: 97.7 F (36.5 C)  97.4 F (36.3 C) 97.3 F (36.3 C)  TempSrc: Oral  Oral Oral  SpO2:  98%  98%  Weight:      Height:        Intake/Output Summary (Last 24 hours) at 02/18/16 0749 Last data filed at 02/18/16 0400  Gross per 24 hour  Intake             1130 ml  Output             1151 ml  Net              -21 ml   Filed Weights   02/17/16 0951  Weight: 250 lb (113.4 kg)    Physical Exam   GEN: Well nourished, well developed, obese male in no acute distress.  HEENT: Grossly normal.  Neck: Supple, no JVD, carotid bruits, or masses. Cardiac: RRR, no murmurs, rubs, or gallops. No clubbing, cyanosis, edema.  Radials/DP/PT 2+ and equal bilaterally.  Respiratory:  Respirations regular and unlabored, clear to  auscultation bilaterally. GI: Soft, nontender, nondistended, BS + x 4. MS: no deformity or atrophy. Skin: warm and dry, no rash. Neuro:  Strength and sensation are intact. Psych: AAOx3.  Normal affect.  Labs    CBC  Recent Labs  02/18/16 0413  WBC 7.7  HGB 13.9  HCT 43.4  MCV 93.1  PLT 167   Basic Metabolic Panel  Recent Labs  02/18/16 0413  NA 137  K 3.8  CL 102  CO2 25  GLUCOSE 127*  BUN 17  CREATININE 0.99  CALCIUM 9.1     Telemetry    NSR - Personally Reviewed  ECG    NSR- Personally Reviewed  Radiology    No results found.   Cardiac Studies   Coronary Stent Intervention  Left Heart Cath and Coronary Angiography    Severe 3 vessel disease: mRCA, OM2 branch, D1- but only 1 PCI target  There is moderate left ventricular systolic dysfunction. The left ventricular ejection fraction is 35-45% by visual estimate.  LV end diastolic pressure is mildly elevated - 19 mmHg  Ost 1st Diag  to 1st Diag lesion, 90 %stenosed. Ostial Lat 2nd Mrg lesion, 80 %stenosed. - Medical management  Prox LAD to Mid LAD lesion, 30 %stenosed. Dist LAD-1 lesion, 35 %stenosed. Dist LAD-2 lesion, 40 %stenosed.  Mid RCA lesion, 85 %stenosed.  Post intervention, there is a 0% residual stenosis.  A STENT PROMUS PREM MR [id number] drug eluting stent was successfully placed.   Although he had three-vessel disease, the only PCI amenable target is the 10 MRCA lesions. These were covered with 1 DES stent.  Otherwise he will be treated medically  Plan:  Based on his reduced ejection fraction mildly elevated LVEDP, I felt it best to monitor overnight early a.m. Discharge.  He will remain on aspirin plus Plavix for minimum one year.  Monitor blood pressure overnight, and consider potentially titrating up beta blocker prior to discharge based on heart rate  He was on Livalo as an outpatient, however probably call was ordered per formulary. Please convert back to Livalo for  discharge   Expected discharge early tomorrow morning    Patient Profile     [name] is a 75 year old male with a past medical history of DM, chronic combined systolic and diastolic CHF (recently diagnosed last month) , HLD, HTN and asthma. Admitted from 02/10/16-02/13/16 for acute systolic and diastolic CHF, discharged with plans to cath on 02/17/16.  Assessment & Plan    1. CAD s/p DES to RCA: Cath report above. Medical management for ostial 1st diagonal 90% stenosed, ostial lateral 2nd marginal 80% stenosis, too small for PCI. Diffuse moderate disease in LAD. Had successful stenting of his mid RCA that was 85% stenosed. Right radial site stable.   He will be on ASA and Plavix. He is on Livalo as an outpatient, will restart at discharge.   2. Chronic combined systolic and diastolic CHF: EF 03-88% by Echo a week ago. LVEDP 19 yesterday. Would continue  40mg  Lasix daily. Continue ACE-I and Coreg. Would add Cleda Daub with reduced EF.   3. Hypertensive heart disease: BP well controlled with current meds.   4. HLD: Continue Livalo at discharge.   Signed, David Huynh  02/18/2016, 7:49 AM   Patient seen, examined. Available data reviewed. Agree with findings, assessment, and plan as outlined by David Huynh. The patient is alert and oriented, no distress. JVP is mildly elevated. Lung fields are clear. Heart is regular rate and rhythm with a 2/6 systolic ejection murmur at the right upper sternal border, the abdomen is soft and obese, nontender. Extremities are without edema.  The patient underwent PCI of the right coronary artery with drug-eluting stent yesterday. He will continue on aspirin and Plavix. Otherwise as outlined above. He has mild volume excess agree with addition of spironolactone. The patient will follow-up with Dr. Herbie Huynh.  David Huynh, M.D. 02/18/2016 9:04 AM

## 2016-02-18 NOTE — Discharge Summary (Signed)
Discharge Summary    Patient ID: David Huynh,  MRN: [id number], DOB/AGE: 05/27/1940 75 y.o.  Admit date: 02/17/2016 Discharge date: 02/18/2016  Primary Care Provider: Danise Edge Primary Cardiologist: Dr. Herbie Baltimore   Discharge Diagnoses    Principal Problem:   Congestive dilated cardiomyopathy (HCC) - EF ~35% Active Problems:   Obesity due to excess calories   Type 2 diabetes mellitus without complication, without long-term current use of insulin (HCC)   Essential hypertension   Combined systolic and diastolic HF (heart failure) (HCC) - SubAcute   Cardiomyopathy (HCC)   Allergies No Known Allergies  Diagnostic Studies/Procedures   Coronary Stent Intervention  Left Heart Cath and Coronary Angiography    Severe 3 vessel disease: mRCA, OM2 branch, D1- but only 1 PCI target  There is moderate left ventricular systolic dysfunction. The left ventricular ejection fraction is 35-45% by visual estimate.  LV end diastolic pressure is mildly elevated - 19 mmHg  Ost 1st Diag to 1st Diag lesion, 90 %stenosed. Ostial Lat 2nd Mrg lesion, 80 %stenosed. - Medical management  Prox LAD to Mid LAD lesion, 30 %stenosed. Dist LAD-1 lesion, 35 %stenosed. Dist LAD-2 lesion, 40 %stenosed.  Mid RCA lesion, 85 %stenosed.  Post intervention, there is a 0% residual stenosis.  A STENT PROMUS PREM MR [id number] drug eluting stent was successfully placed.   Although he had three-vessel disease, the only PCI amenable target is the 10 MRCA lesions. These were covered with 1 DES stent.  Otherwise he will be treated medically  _____________   History of Present Illness   David Huynh is a 75 year old male with a past medical history of asthma, DM, HTN, HLD and obesity. He was admitted on 02/10/16-02/13/16 with cough, found to have acute chronic combined systolic and diastolic CHF. He was discharged with plans to have left heart cath on 02/17/16.  Hospital Course  Full heart catheterization  report above. He had severe 3 vessel disease in the mid RCA, OM2 branch and in his first diagonal. There was only one PCI amenable target, the mid RCA that was treated with a DES.   He was stable post procedure. He will be on DAPT with ASA and Plavix.   He will remain on  Lasix a day, ACE-I, and Coreg. Spironolactone was added to his medication regimen.   His Livalo was continued at discharge. He will follow up in our office in one week. His right radial site was stable without hematoma.   He was seen today by Dr. Excell Seltzer and deemed suitable for discharge.  _____________  Discharge Vitals Blood pressure (!) 141/77, pulse 93, temperature 97.7 F (36.5 C), temperature source Oral, resp. rate (!) 27, height 6' (1.829 m), weight 250 lb (113.4 kg), SpO2 98 %.  Filed Weights   02/17/16 0951  Weight: 250 lb (113.4 kg)    Labs & Radiologic Studies     CBC  Recent Labs  02/18/16 0413  WBC 7.7  HGB 13.9  HCT 43.4  MCV 93.1  PLT 167   Basic Metabolic Panel  Recent Labs  02/18/16 0413  NA 137  K 3.8  CL 102  CO2 25  GLUCOSE 127*  BUN 17  CREATININE 0.99  CALCIUM 9.1    Dg Chest 2 View  Result Date: 02/10/2016 CLINICAL DATA:  Cough, chest congestion, and shortness of breath for the past 3 weeks. Previous episode of pneumothorax due to rib fractures. History of asthma. EXAM: CHEST  2 VIEW COMPARISON:  PA and lateral chest x-ray of May 21, 2015 FINDINGS: The lungs are mildly hyperinflated. There is no pneumothorax. The interstitial markings are increased bilaterally. The left hemidiaphragm is partially obscured. There small pleural effusions blunting the posterior costophrenic angles. The cardiac silhouette is mildly enlarged. The pulmonary vascularity is mildly engorged. There is calcification in the wall of the aortic arch. The bony thorax exhibits no acute abnormality. IMPRESSION: CHF with mild interstitial edema. Trace bilateral pleural effusions. No alveolar pneumonia.  Underlying reactive airway disease. Electronically Signed   By: David  SwazilandJordan M.D.   On: 02/10/2016 11:30   Ct Angio Chest Pe W Or Wo Contrast  Result Date: 02/11/2016 CLINICAL DATA:  Shortness of Breath EXAM: CT ANGIOGRAPHY CHEST WITH CONTRAST TECHNIQUE: Multidetector CT imaging of the chest was performed using the standard protocol during bolus administration of intravenous contrast. Multiplanar CT image reconstructions and MIPs were obtained to evaluate the vascular anatomy. CONTRAST:  80 mL Isovue 370 nonionic COMPARISON:  Chest radiograph February 10, 2016 FINDINGS: Cardiovascular: There is no demonstrable pulmonary embolus. There is prominence of the ascending thoracic aorta with a measured transverse diameter of 4.1 x 3.8 cm. There is no thoracic dissection. There are scattered foci of atherosclerotic calcification in the aorta. The visualized great vessels are normal except for mild proximal atherosclerotic calcification without significant vessel narrowing. There are scattered foci of coronary artery calcification. Pericardium is not thickened. Heart overall is mildly enlarged. There is prominence of the main pulmonary outflow tract measuring 3.2 cm in diameter, suggesting underlying pulmonary arterial hypertension. Mediastinum/Nodes: Thyroid appears unremarkable. There are multiple small mediastinal lymph nodes throughout the mediastinum. There is a lymph node in the right hilum measuring 1.7 x 1.4 cm. There is a lymph node just to the right of the carina anteriorly measuring 1.7 x 1.4 cm. There is a lymph node anterior to the left carina measuring 1.5 x 1.0 cm. There is a sub- carinal lymph node measuring 2.1 x 1.7 cm. Lungs/Pleura: There are moderate free-flowing pleural effusions bilaterally. There is bibasilar airspace consolidation. There is mild interstitial edema in the lung bases. Upper Abdomen: Visualized upper abdominal structures appear unremarkable. There is a slight degree of reflux into  the inferior vena cava but no reflux into the hepatic veins. Musculoskeletal: There is degenerative change in the thoracic spine with diffuse idiopathic skeletal hyperostosis. There is degenerative change in the sternomanubrial joint. There are no blastic or lytic bone lesions. Review of the MIP images confirms the above findings. IMPRESSION: No demonstrable pulmonary embolus. Prominence of the ascending thoracic aorta with a measured transverse diameter of 4.1 x 3.8 cm. Recommend annual imaging followup by CTA or MRA. This recommendation follows 2010 ACCF/AHA/AATS/ACR/ASA/SCA/SCAI/SIR/STS/SVM Guidelines for the Diagnosis and Management of Patients with Thoracic Aortic Disease. Circulation. 2010; 121: O130-Q657: [id number]. No thoracic aortic dissection noted. Foci of coronary artery calcification as well as scattered foci of calcification in the aorta and proximal great vessels. Mild reflux of contrast into the inferior vena cava may indicate a degree of increased right heart pressure. There are bilateral pleural effusions with mild cardiomegaly and mild bibasilar interstitial edema. These are findings indicative of a degree of congestive heart failure. There is airspace consolidation in the lung bases, concerning for pneumonia with superimposed atelectasis in the bases. Multiple prominent lymph nodes of uncertain etiology. Given the changes, this mild adenopathy may be of reactive etiology. There is diffuse idiopathic skeletal hyperostosis in the thoracic spine. Electronically Signed   By: Bretta BangWilliam  Woodruff III M.D.  On: 02/11/2016 13:40    Disposition   Pt is being discharged home today in good condition.  Follow-up Plans & Appointments    Follow-up Information    Azalee Course, [place] Follow up on 02/25/2016.   Specialties:  Cardiology, Radiology Why:  at 1:30pm for hospital follow up with Dr. Elissa Hefty PA Contact information: [address] [place] [address] [phone]          Discharge  Instructions    Amb Referral to Cardiac Rehabilitation    Complete by:  As directed    Diagnosis:   PTCA Coronary Stents     Diet - low sodium heart healthy    Complete by:  As directed    Discharge instructions    Complete by:  As directed    Resume Metformin tomorrow morning  Radial Site Care Refer to this sheet in the next few weeks. These instructions provide you with information on caring for yourself after your procedure. Your caregiver may also give you more specific instructions. Your treatment has been planned according to current medical practices, but problems sometimes occur. Call your caregiver if you have any problems or questions after your procedure. HOME CARE INSTRUCTIONS You may shower the day after the procedure.Remove the bandage (dressing) and gently wash the site with plain soap and water.Gently pat the site dry.  Do not apply powder or lotion to the site.  Do not submerge the affected site in water for 3 to 5 days.  Inspect the site at least twice daily.  Do not flex or bend the affected arm for 24 hours.  No lifting over 5 pounds (2.3 kg) for 5 days after your procedure.  Do not drive home if you are discharged the same day of the procedure. Have someone else drive you.  You may drive 24 hours after the procedure unless otherwise instructed by your caregiver.  What to expect: Any bruising will usually fade within 1 to 2 weeks.  Blood that collects in the tissue (hematoma) may be painful to the touch. It should usually decrease in size and tenderness within 1 to 2 weeks.  SEEK IMMEDIATE MEDICAL CARE IF: You have unusual pain at the radial site.  You have redness, warmth, swelling, or pain at the radial site.  You have drainage (other than a small amount of blood on the dressing).  You have chills.  You have a fever or persistent symptoms for more than 72 hours.  You have a fever and your symptoms suddenly get worse.  Your arm becomes pale, cool, tingly, or  numb.  You have heavy bleeding from the site. Hold pressure on the site.    NO HEAVY LIFTING OR SEXUAL ACTIVITY for 7 DAYS. NO DRIVING for 3-5 DAYS. NO SOAKING BATHS, HOT TUBS, POOLS, ETC., for 7 DAYS   Increase activity slowly    Complete by:  As directed       Discharge Medications   Discharge Medication List as of 02/18/2016 11:55 AM    START taking these medications   Details  clopidogrel (PLAVIX) 75 MG tablet Take 1 tablet (75 mg total) by mouth daily with breakfast., Starting Fri 02/18/2016, Normal    nitroGLYCERIN (NITROSTAT) 0.4 MG SL tablet Place 1 tablet (0.4 mg total) under the tongue every 5 (five) minutes as needed for chest pain., Starting Fri 02/18/2016, Normal      CONTINUE these medications which have NOT CHANGED   Details  acetaminophen (TYLENOL) 500 MG tablet Take 500  mg by mouth 3 (three) times daily as needed (pain)., Until Discontinued, Historical Med    amLODipine (NORVASC) 10 MG tablet Take 1 tablet (10 mg total) by mouth daily., Starting Sun 02/13/2016, Print    aspirin EC 81 MG EC tablet Take 1 tablet (81 mg total) by mouth daily., Starting Sun 02/13/2016, Print    carvedilol (COREG) 6.25 MG tablet Take 1 tablet (6.25 mg total) by mouth 2 (two) times daily with a meal., Starting Sun 02/13/2016, Print    cholecalciferol (VITAMIN D) 1000 units tablet Take 1,000 Units by mouth daily., Historical Med    fexofenadine (ALLEGRA) 30 MG tablet Take 30 mg by mouth daily. , Historical Med    Fluticasone-Salmeterol (ADVAIR) 100-50 MCG/DOSE AEPB Inhale 1 puff into the lungs 2 (two) times daily., Starting Fri 05/21/2015, Normal    furosemide (LASIX) 40 MG tablet Take 1 tablet (40 mg total) by mouth daily., Starting Sun 02/13/2016, Print    glimepiride (AMARYL) 4 MG tablet Take 1 tablet (4 mg total) by mouth daily with breakfast., Starting Sun 02/13/2016, Print    lisinopril (PRINIVIL,ZESTRIL) 40 MG tablet Take 1 tablet (40 mg total) by mouth daily., Starting Sun  02/13/2016, Print    metFORMIN (GLUCOPHAGE) 1000 MG tablet TAKE 1 TABLET(1000 MG) BY MOUTH TWICE DAILY WITH A MEAL, Normal    Pitavastatin Calcium (LIVALO) 4 MG TABS Take 1 tablet (4 mg total) by mouth daily., Starting Tue 05/25/2015, Normal    potassium chloride SA (K-DUR,KLOR-CON) 20 MEQ tablet Take 1 tablet (20 mEq total) by mouth daily., Starting Sun 02/13/2016, Print    VENTOLIN HFA 108 (90 Base) MCG/ACT inhaler INHALE 2 PUFFS INTO THE LUNGS AS NEEDED, Starting Tue 02/08/2016, Normal         Aspirin prescribed at discharge?  Yes High Intensity Statin Prescribed? (Lipitor 40-80mg  or Crestor 20-40mg ): Yes Beta Blocker Prescribed? Yes For EF 40% or less, Was ACEI/ARB Prescribed? Yes ADP Receptor Inhibitor Prescribed? (i.e. Plavix etc.-Includes Medically Managed Patients): Yes For EF <40%, Aldosterone Inhibitor Prescribed? Yes Was EF assessed during THIS hospitalization? No: EF assessed recently  Was Cardiac Rehab II ordered? (Included Medically managed Patients): Yes   Outstanding Labs/Studies     Duration of Discharge Encounter   Greater than 30 minutes including physician time.  Signed, Little Ishikawa NP 02/18/2016, 4:25 PM

## 2016-02-21 MED FILL — Clopidogrel Bisulfate Tab 300 MG (Base Equiv): ORAL | Qty: 1 | Status: AC

## 2016-02-21 NOTE — Telephone Encounter (Signed)
Patient contacted regarding discharge from [facility] on 02/18/16.  Patient understands to follow up with provider Azalee Course on 10/13 at 1:30pm at [facility]. Patient understands discharge instructions? Yes Patient understands medications and regiment? Yes Patient understands to bring all medications to this visit? Yes

## 2016-02-25 ENCOUNTER — Encounter: Payer: Self-pay | Admitting: Physician Assistant

## 2016-02-25 ENCOUNTER — Ambulatory Visit (INDEPENDENT_AMBULATORY_CARE_PROVIDER_SITE_OTHER): Payer: Medicare Other | Admitting: Physician Assistant

## 2016-02-25 VITALS — BP 140/85 | HR 104 | Ht 72.0 in | Wt 273.2 lb

## 2016-02-25 DIAGNOSIS — I10 Essential (primary) hypertension: Secondary | ICD-10-CM | POA: Diagnosis not present

## 2016-02-25 DIAGNOSIS — Z79899 Other long term (current) drug therapy: Secondary | ICD-10-CM | POA: Diagnosis not present

## 2016-02-25 DIAGNOSIS — E119 Type 2 diabetes mellitus without complications: Secondary | ICD-10-CM

## 2016-02-25 DIAGNOSIS — E782 Mixed hyperlipidemia: Secondary | ICD-10-CM

## 2016-02-25 DIAGNOSIS — I255 Ischemic cardiomyopathy: Secondary | ICD-10-CM

## 2016-02-25 DIAGNOSIS — E669 Obesity, unspecified: Secondary | ICD-10-CM

## 2016-02-25 DIAGNOSIS — I2510 Atherosclerotic heart disease of native coronary artery without angina pectoris: Secondary | ICD-10-CM

## 2016-02-25 DIAGNOSIS — I5043 Acute on chronic combined systolic (congestive) and diastolic (congestive) heart failure: Secondary | ICD-10-CM

## 2016-02-25 DIAGNOSIS — E1169 Type 2 diabetes mellitus with other specified complication: Secondary | ICD-10-CM

## 2016-02-25 MED ORDER — FUROSEMIDE 40 MG PO TABS
ORAL_TABLET | ORAL | 9 refills | Status: DC
Start: 2016-02-25 — End: 2016-04-14

## 2016-02-25 MED ORDER — SPIRONOLACTONE 25 MG PO TABS: 25.0000 mg | ORAL_TABLET | Freq: Every day | ORAL | 3 refills | Status: DC

## 2016-02-25 MED ORDER — CARVEDILOL 12.5 MG PO TABS: 12.5000 mg | ORAL_TABLET | Freq: Two times a day (BID) | ORAL | 3 refills | Status: DC

## 2016-02-25 NOTE — Patient Instructions (Addendum)
Medication Instructions:  START- Spironolactone 25 mg daily INCREASE- Carvedilol 12.5 mg twice a day INCREASE- Lasix 40 mg in the morning and 20 mg in the evening  Labwork: BMP, Fasting Lipid Liver  Testing/Procedures: None Ordered  Follow-Up: Your physician recommends that you schedule a follow-up appointment in: 2-4 weeks   Any Other Special Instructions Will Be Listed Below (If Applicable).   If you need a refill on your cardiac medications before your next appointment, please call your pharmacy.   Heart failure prevention instruction: 1. Avoid salt 2. Limit total fluid intake per day to between 32-64 oz  3. Weigh yourself daily, call cardiology if weight increase by more than 3 lbs overnight or 5 lbs in a single week  Please also check you blood pressure once a day roughly 2 hours after morning medication (2 hour is enough time for your body to absorb the blood pressure meds)

## 2016-02-25 NOTE — Progress Notes (Addendum)
Cardiology Office Note    Date:  02/25/2016   ID:  AVYUKTH BONTEMPO, DOB 1941-03-14, MRN [id number]  PCP:  Danise Edge, MD  Cardiologist:  Dr. Herbie Baltimore   Chief Complaint  Patient presents with  . Hospitalization Follow-up    7 day TCM followup after recent cath and PCI to mid RCA, seen for Dr. Herbie Baltimore    History of Present Illness:  David Huynh is a 75 y.o. male with PMH of HTN, HLD, DM, asthma who was initially admitted on 02/10/2016 with cough and bilateral lower extremity edema. 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 Echocardiogram obtained on 02/12/2016 showed EF 35-40%, mild LVH, diffuse hypokinesis, indeterminate diastolic dysfunction, PA peak pressure 39 mmHg. He was treated for acute on chronic combined systolic and diastolic heart failure. He was discharged on 02/13/2016 on 40 mg daily of Lasix with plan for elective cardiac catheterization and a later time. He underwent outpatient cardiac catheterization on 06/20/2015 which showed severe three-vessel disease with 90% ostial D1 stenosis, 80% ostial second marginal stenosis, 40% distal LAD stenosis, 85% mid RCA stenosis treated with 3.5 x 28 mm DES, EF 35-45%. The diagonal and lateral marginal lesion being managed medically. He was placed on aspirin and Plavix with plan to continue for at least one year. Spironolactone was added on top of ACE inhibitor and carvedilol.  He presents today for seven-day transition of care cardiology office visit. The has been doing well since released from hospital. Surprisingly he never had chest pain even prior to the recent cardiac catheterization. EKG today show no significant ST-T wave changes. He did gain roughly 2 pounds since discharge, his weight today is 273 pounds, whereas his baseline dry weight is 271 pounds. He does have 1-2+ pitting edema in the left lower extremity and 1+ pitting edema in the right lower extremity. He has been  compliant with his medication. We have went over the need to be compliant with poor antiplatelet therapy. We also went over his cath film and echocardiogram. The goal for now should be aggressive blood pressure control and improve his overall pumping function of the heart. He is on carvedilol, lisinopril, and Lasix. Although there was some mention of adding spironolactone prior to his recent discharge, however this does not appear to have been done. I have started him on 25 mg daily spironolactone. Given the sign of fluid accumulation, I will also increase his Lasix to 40 mg a.m./20 mg p.m. I will increase his carvedilol to 12.5 mg twice a day for better blood pressure control as his blood pressure today is still elevated at 140/85. Looking over some his older labs, he has not had a fasting lipid panel since January 2017, surprisingly his cholesterol is significantly worse in January compared to September 2016 when most of his cholesterol is well-controlled. He says he has always been on Livalo for the past several years and there has been no medication changes. I will hold off on initiating high-dose Lipitor at this time, we will obtain a fasting lipid panel in about 5 days along with liver function test, if his cholesterol is still uncontrolled, I will switch him to either high-dose Lipitor or Crestor. Given the increasing the Lasix, we will also obtain a basic metabolic panel in 5 days.   Past Medical History:  Diagnosis Date  . Allergy    seasonal  . Asthma    mild, intermittent  . Coronary  artery disease   . Diabetes mellitus    type 2  . Fracture of multiple ribs 02/12/2015  . Hyperlipidemia   . Hypertension   . Hypocalcemia 02/12/2015  . Liver function study, abnormal 04/26/2011  . Multiple rib fractures   . Obesity     Past Surgical History:  Procedure Laterality Date  . APPENDECTOMY  1975  . CARDIAC CATHETERIZATION N/A 02/17/2016   Procedure: Left Heart Cath and Coronary Angiography;   Surgeon: Marykay Lex, MD;  Location: [facility] INVASIVE CV LAB;  Service: Cardiovascular;  Laterality: N/A;  . CARDIAC CATHETERIZATION N/A 02/17/2016   Procedure: Coronary Stent Intervention;  Surgeon: Marykay Lex, MD;  Location: [facility] INVASIVE CV LAB;  Service: Cardiovascular;  Laterality: N/A;  . CATARACT EXTRACTION  2010   b/l  . CORONARY STENT PLACEMENT  02/17/2016   Mid RCA lesion, 85 %stenosed.  Marland Kitchen RETINAL LASER PROCEDURE  1985  . TONSILLECTOMY AND ADENOIDECTOMY  1947  . VIDEO ASSISTED THORACOSCOPY (VATS)/THOROCOTOMY Right 01/24/2015   Procedure: VIDEO ASSISTED THORACOSCOPY (VATS) FOR DRAINAGE OF RIGHT HEMOTHORAX;  Surgeon: Alleen Borne, MD;  Location: [facility] OR;  Service: Thoracic;  Laterality: Right;    Current Medications: Outpatient Medications Prior to Visit  Medication Sig Dispense Refill  . acetaminophen (TYLENOL) 500 MG tablet Take 500 mg by mouth 3 (three) times daily as needed (pain).    Marland Kitchen amLODipine (NORVASC) 10 MG tablet Take 1 tablet (10 mg total) by mouth daily. 30 tablet 3  . aspirin EC 81 MG EC tablet Take 1 tablet (81 mg total) by mouth daily. 30 tablet 3  . cholecalciferol (VITAMIN D) 1000 units tablet Take 1,000 Units by mouth daily.    . clopidogrel (PLAVIX) 75 MG tablet Take 1 tablet (75 mg total) by mouth daily with breakfast. 30 tablet 12  . fexofenadine (ALLEGRA) 30 MG tablet Take 30 mg by mouth daily.     . Fluticasone-Salmeterol (ADVAIR) 100-50 MCG/DOSE AEPB Inhale 1 puff into the lungs 2 (two) times daily. 1 each 3  . glimepiride (AMARYL) 4 MG tablet Take 1 tablet (4 mg total) by mouth daily with breakfast. 30 tablet 3  . lisinopril (PRINIVIL,ZESTRIL) 40 MG tablet Take 1 tablet (40 mg total) by mouth daily. 30 tablet 3  . metFORMIN (GLUCOPHAGE) 1000 MG tablet TAKE 1 TABLET(1000 MG) BY MOUTH TWICE DAILY WITH A MEAL 180 tablet 0  . nitroGLYCERIN (NITROSTAT) 0.4 MG SL tablet Place 1 tablet (0.4 mg total) under the tongue every 5 (five) minutes as needed for chest  pain. 25 tablet 2  . Pitavastatin Calcium (LIVALO) 4 MG TABS Take 1 tablet (4 mg total) by mouth daily. 30 tablet 3  . potassium chloride SA (K-DUR,KLOR-CON) 20 MEQ tablet Take 1 tablet (20 mEq total) by mouth daily. 30 tablet 1  . VENTOLIN HFA 108 (90 Base) MCG/ACT inhaler INHALE 2 PUFFS INTO THE LUNGS AS NEEDED (Patient taking differently: Inhale 2 puffs into the lungs every 6 (six) hours as needed for wheezing or shortness of breath. ) 18 g 0  . carvedilol (COREG) 6.25 MG tablet Take 1 tablet (6.25 mg total) by mouth 2 (two) times daily with a meal. 60 tablet 3  . furosemide (LASIX) 40 MG tablet Take 1 tablet (40 mg total) by mouth daily. 30 tablet 3   No facility-administered medications prior to visit.      Allergies:   Review of patient's allergies indicates no known allergies.   Social History   Social History  .  Marital status: Married    Spouse name: N/A  . Number of children: N/A  . Years of education: N/A   Social History Main Topics  . Smoking status: Never Smoker  . Smokeless tobacco: Never Used  . Alcohol use Yes     Comment: wine on weekends w/dinner  . Drug use: No  . Sexual activity: Yes    Partners: Female   Other Topics Concern  . None   Social History Narrative  . None     Family History:  The patient's family history is not on file.   ROS:   Please see the history of present illness.    ROS All other systems reviewed and are negative.   PHYSICAL EXAM:   VS:  BP 140/85   Pulse (!) 104   Ht 6' (1.829 m)   Wt 273 lb 3.2 oz (123.9 kg)   BMI 37.05 kg/m    GEN: Well nourished, well developed, in no acute distress  HEENT: normal  Neck: no JVD, carotid bruits, or masses Cardiac: RRR; no murmurs, rubs, or gallops. 1-2+ pitting edema on the left and 1+ pitting edema on the right LE Respiratory:  clear to auscultation bilaterally, normal work of breathing GI: soft, nontender, nondistended, + BS MS: no deformity or atrophy  Skin: warm and dry, no  rash Neuro:  Alert and Oriented x 3, Strength and sensation are intact Psych: euthymic mood, full affect  Wt Readings from Last 3 Encounters:  02/25/16 273 lb 3.2 oz (123.9 kg)  02/17/16 250 lb (113.4 kg)  02/13/16 271 lb 13.2 oz (123.3 kg)      Studies/Labs Reviewed:   EKG:  EKG is ordered today.  The ekg ordered today demonstrates Sinus tachycardia with heart rate 104, on otherwise no significant ST-T wave changes.  Recent Labs: 02/10/2016: ALT 37; B Natriuretic Peptide 542.5; Magnesium 2.1; TSH 1.542 02/18/2016: BUN 17; Creatinine, Ser 0.99; Hemoglobin 13.9; Platelets 167; Potassium 3.8; Sodium 137   Lipid Panel    Component Value Date/Time   CHOL 251 (H) 05/21/2015 1520   TRIG 198 (H) 05/21/2015 1520   HDL 53 05/21/2015 1520   CHOLHDL 4.7 05/21/2015 1520   VLDL 40 (H) 05/21/2015 1520   LDLCALC 158 (H) 05/21/2015 1520   LDLDIRECT 199.5 04/17/2011 0820    Additional studies/ records that were reviewed today include:   Echo 02/12/2016 LV EF: 35% -   40%  Study Conclusions  - Left ventricle: The cavity size was normal. Wall thickness was   increased in a pattern of mild LVH. Systolic function was   moderately reduced. The estimated ejection fraction was in the   range of 35% to 40%. Diffuse hypokinesis. Diastolic function is   abnormal, indeterminate grade. There is evidence of elevated LA   pressure. - Aortic valve: Mildly calcified annulus. Mildly thickened   leaflets. There was mild stenosis. Mean gradient (S): 10 mm Hg.   Valve area (VTI): 1.51 cm^2. - Mitral valve: Mildly calcified annulus. Mildly thickened leaflets   . There was mild regurgitation. - Left atrium: The atrium was mildly dilated. - Atrial septum: No defect or patent foramen ovale was identified. - Pulmonary arteries: Systolic pressure was mildly to moderately   increased. PA peak pressure: 39 mm Hg (S). - Technically difficult study. Echocontrast was used to enhance   visualization.   Cath  02/17/2016  Severe 3 vessel disease: mRCA, OM2 branch, D1- but only 1 PCI target  There is moderate left ventricular systolic  dysfunction. The left ventricular ejection fraction is 35-45% by visual estimate.  LV end diastolic pressure is mildly elevated - 19 mmHg  Ost 1st Diag to 1st Diag lesion, 90 %stenosed. Ostial Lat 2nd Mrg lesion, 80 %stenosed. - Medical management  Prox LAD to Mid LAD lesion, 30 %stenosed. Dist LAD-1 lesion, 35 %stenosed. Dist LAD-2 lesion, 40 %stenosed.  Mid RCA lesion, 85 %stenosed.  Post intervention, there is a 0% residual stenosis.  A STENT PROMUS PREM MR [id number] drug eluting stent was successfully placed.   Although he had three-vessel disease, the only PCI amenable target is the 10 MRCA lesions. These were covered with 1 DES stent.  Otherwise he will be treated medically  Plan:  Based on his reduced ejection fraction mildly elevated LVEDP, I felt it best to monitor overnight early a.m. Discharge.  He will remain on aspirin plus Plavix for minimum one year.  Monitor blood pressure overnight, and consider potentially titrating up beta blocker prior to discharge based on heart rate  He was on Livalo as an outpatient, however probably call was ordered per formulary. Please convert back to Livalo for discharge    ASSESSMENT:    1. Coronary artery disease involving native coronary artery of native heart without angina pectoris   2. Medication management   3. Mixed hyperlipidemia   4. Essential hypertension   5. Diabetes mellitus type 2 in obese (HCC)   6. Cardiomyopathy, ischemic EF 35-45%   7. Acute on chronic combined systolic and diastolic CHF (congestive heart failure) (HCC)      PLAN:  In order of problems listed above:  1. CAD: Noted to have three-vessel disease on recent cath after initially presented to the hospital with acute heart failure. He eventually underwent DES to mid RCA, still has 90% ostial D1 that is managed medically.  Continue aspirin and the Plavix.  2. Acute on chronic combined systolic and diastolic heart failure - His lung is clear, he has 1-2+ pitting edema in the left lower extremity, 1+ pitting edema in the right lower extremity, he appears to be fluid overloaded. We'll increase Lasix to 40 mg a.m. and 20 mg p.m. with addition of 25 mg spironolactone daily. Basic metabolic panel in 5 days, follow-up in 2 weeks for reassessment of the floor level.  3. Ischemic cardiomyopathy with EF 35-45%: We'll defer to Dr. Herbie Baltimore to consider a repeat echocardiogram in 3 month.  - Uptitrate carvedilol to 12.5 mg twice a day. Add spironolactone 25 mg daily. Continue lisinopril for LV dysfunction  4. HTN: Blood pressure mildly elevated today, I have increased carvedilol to 12.5 mg twice a day, add spironolactone 25 mg daily, increased Lasix to 40 mg a.m./20 mg p.m. dose. Obtain basic metabolic panel in 5 days  5. HLD: No lipid panel was obtained during recent hospitalization, looking back at previous record, interestingly, his lipid panel obtained in September 2016 was very well-controlled, however a repeat lipid panel obtained in January 2017 showed uncontrolled cholesterol despite the patient stating that he has been on Livalo for years. Plan to obtain fasting lipid panel and LFTs in 5 days for reassessment, he was still high, will consider add high-dose Lipitor or Crestor.  6. DM II: Managed by PCP.    Medication Adjustments/Labs and Tests Ordered: Current medicines are reviewed at length with the patient today.  Concerns regarding medicines are outlined above.  Medication changes, Labs and Tests ordered today are listed in the Patient Instructions below. Patient Instructions  Medication Instructions:  START- Spironolactone 25 mg daily INCREASE- Carvedilol 12.5 mg twice a day INCREASE- Lasix 40 mg in the morning and 20 mg in the evening  Labwork: BMP, Fasting Lipid Liver  Testing/Procedures: None  Ordered  Follow-Up: Your physician recommends that you schedule a follow-up appointment in: 2-4 weeks   Any Other Special Instructions Will Be Listed Below (If Applicable).   If you need a refill on your cardiac medications before your next appointment, please call your pharmacy.   Heart failure prevention instruction: 1. Avoid salt 2. Limit total fluid intake per day to between 32-64 oz  3. Weigh yourself daily, call cardiology if weight increase by more than 3 lbs overnight or 5 lbs in a single week  Please also check you blood pressure once a day roughly 2 hours after morning medication (2 hour is enough time for your body to absorb the blood pressure meds)    Signed, Azalee CourseHao Abigayl Hor, PA  02/25/2016 11:31 PM    [facility] [address], [place], [place]  [id number] Phone: [phone]) [phone]; Fax: [phone]

## 2016-03-03 DIAGNOSIS — Z79899 Other long term (current) drug therapy: Secondary | ICD-10-CM | POA: Diagnosis not present

## 2016-03-03 DIAGNOSIS — E782 Mixed hyperlipidemia: Secondary | ICD-10-CM | POA: Diagnosis not present

## 2016-03-03 LAB — LIPID PANEL
CHOL/HDL RATIO: 4.4 ratio (ref <=5.0)
CHOLESTEROL: 205 mg/dL — AB (ref 125–200)
HDL: 47 mg/dL (ref >=40)
LDL Cholesterol: 125 mg/dL (ref <130)
TRIGLYCERIDES: 165 mg/dL — AB (ref <150)
VLDL: 33 mg/dL — ABNORMAL HIGH (ref <30)

## 2016-03-03 LAB — HEPATIC FUNCTION PANEL
ALBUMIN: 4.4 g/dL (ref 3.6–5.1)
ALK PHOS: 52 U/L (ref 40–115)
ALT: 28 U/L (ref 9–46)
AST: 25 U/L (ref 10–35)
BILIRUBIN TOTAL: 0.7 mg/dL (ref 0.2–1.2)
Bilirubin, Direct: 0.1 mg/dL (ref <=0.2)
Indirect Bilirubin: 0.6 mg/dL (ref 0.2–1.2)
Total Protein: 7.1 g/dL (ref 6.1–8.1)

## 2016-03-03 LAB — BASIC METABOLIC PANEL
BUN: 23 mg/dL (ref 7–25)
CALCIUM: 9.8 mg/dL (ref 8.6–10.3)
CO2: 28 mmol/L (ref 20–31)
CREATININE: 1.12 mg/dL (ref 0.70–1.18)
Chloride: 101 mmol/L (ref 98–110)
Glucose, Bld: 108 mg/dL — ABNORMAL HIGH (ref 65–99)
Potassium: 4.6 mmol/L (ref 3.5–5.3)
SODIUM: 139 mmol/L (ref 135–146)

## 2016-03-06 ENCOUNTER — Telehealth: Payer: Self-pay | Admitting: Physician Assistant

## 2016-03-06 MED ORDER — ATORVASTATIN CALCIUM 80 MG PO TABS: 80.0000 mg | ORAL_TABLET | Freq: Every day | ORAL | 6 refills | Status: DC

## 2016-03-06 NOTE — Telephone Encounter (Signed)
-----   Message from [place], [place] sent at 03/06/2016  2:07 PM EDT ----- kidney function stable on increased dose of diuretic. Electrolyte normal. His cholesterol still high, granted they are improved compare to 9 month ago, recommend change pitavastatin to 80mg  lipitor for better control. Liver function normal.

## 2016-03-06 NOTE — Telephone Encounter (Signed)
Spoke to pt. He reviewed his labs on MyChart and agreed to switch to Lipitor 80 mg daily. Pitavastatin discontinued and Lipitor Rx sent to pharmacy.

## 2016-03-14 ENCOUNTER — Other Ambulatory Visit: Payer: Self-pay | Admitting: Family Medicine

## 2016-03-15 ENCOUNTER — Encounter: Payer: Self-pay | Admitting: Physician Assistant

## 2016-03-15 ENCOUNTER — Ambulatory Visit (INDEPENDENT_AMBULATORY_CARE_PROVIDER_SITE_OTHER): Payer: Medicare Other | Admitting: Physician Assistant

## 2016-03-15 VITALS — BP 135/75 | HR 68 | Ht 72.0 in | Wt 263.4 lb

## 2016-03-15 DIAGNOSIS — I255 Ischemic cardiomyopathy: Secondary | ICD-10-CM

## 2016-03-15 DIAGNOSIS — I25810 Atherosclerosis of coronary artery bypass graft(s) without angina pectoris: Secondary | ICD-10-CM | POA: Diagnosis not present

## 2016-03-15 DIAGNOSIS — I5043 Acute on chronic combined systolic (congestive) and diastolic (congestive) heart failure: Secondary | ICD-10-CM | POA: Diagnosis not present

## 2016-03-15 DIAGNOSIS — E782 Mixed hyperlipidemia: Secondary | ICD-10-CM

## 2016-03-15 DIAGNOSIS — E118 Type 2 diabetes mellitus with unspecified complications: Secondary | ICD-10-CM

## 2016-03-15 DIAGNOSIS — I10 Essential (primary) hypertension: Secondary | ICD-10-CM

## 2016-03-15 LAB — BASIC METABOLIC PANEL
BUN: 29 mg/dL — AB (ref 7–25)
CHLORIDE: 102 mmol/L (ref 98–110)
CO2: 27 mmol/L (ref 20–31)
Calcium: 9.9 mg/dL (ref 8.6–10.3)
Creat: 1.08 mg/dL (ref 0.70–1.18)
GLUCOSE: 118 mg/dL — AB (ref 65–99)
POTASSIUM: 4.7 mmol/L (ref 3.5–5.3)
Sodium: 137 mmol/L (ref 135–146)

## 2016-03-15 NOTE — Patient Instructions (Signed)
Medication Instructions:  Your physician recommends that you continue on your current medications as directed. Please refer to the Current Medication list given to you today.  Labwork: Your physician recommends that you return for lab work in: [facility]  Your physician recommends that you return for lab work in: 6 WEEKS FASTING COMPLETE LIPIDS & LFT  Testing/Procedures: NONE   Follow-Up: Your physician recommends that you schedule a follow-up appointment in: 2 MONTHS with DR [facility]   Any Other Special Instructions Will Be Listed Below (If Applicable).     If you need a refill on your cardiac medications before your next appointment, please call your pharmacy.

## 2016-03-15 NOTE — Progress Notes (Signed)
Cardiology Office Note    Date:  03/15/2016   ID:  REINER LOEWEN, DOB 17-Dec-1940, MRN [id number]  PCP:  Danise Edge, MD  Cardiologist:  Dr. Herbie Baltimore   Chief Complaint  Patient presents with  . Follow-up    seen for Dr. Herbie Baltimore, evaluate fluid status    History of Present Illness:  David Huynh is a 75 y.o. male with PMH of HTN, HLD, DM, asthma who was initially admitted on 02/10/2016 with cough and bilateral lower extremity edema. 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 Echocardiogram obtained on 02/12/2016 showed EF 35-40%, mild LVH, diffuse hypokinesis, indeterminate diastolic dysfunction, PA peak pressure 39 mmHg. He was treated for acute on chronic combined systolic and diastolic heart failure. He was discharged on 02/13/2016 on 40 mg daily of Lasix with plan for elective cardiac catheterization and a later time. He underwent outpatient cardiac catheterization on 06/20/2015 which showed severe three-vessel disease with 90% ostial D1 stenosis, 80% ostial second marginal stenosis, 40% distal LAD stenosis, 85% mid RCA stenosis treated with 3.5 x 28 mm DES, EF 35-45%. The diagonal and lateral marginal lesion being managed medically. He was placed on aspirin and Plavix with plan to continue for at least one year. Spironolactone was added on top of ACE inhibitor and carvedilol.  I last saw the patient on 02/25/2016 as seven-day transition of care follow-up. It was noted patient had increased lower extremity edema. Gent did increase his Lasix to 40 mg a.m./20 mg p.m. I also increased his carvedilol to 12.5 mg twice a day. A follow-up renal function was stable. His lipid profile showed total cholesterol 205, LDL 125, HDL 47. I changed him to high-dose Lipitor instead. He presents today for cardiology office visit. He denies any chest discomfort or shortness of breath. His lower extremity edema has completely resolved. He has no other acute  issues.    Past Medical History:  Diagnosis Date  . Allergy    seasonal  . Asthma    mild, intermittent  . Coronary artery disease   . Diabetes mellitus    type 2  . Fracture of multiple ribs 02/12/2015  . Hyperlipidemia   . Hypertension   . Hypocalcemia 02/12/2015  . Liver function study, abnormal 04/26/2011  . Multiple rib fractures   . Obesity     Past Surgical History:  Procedure Laterality Date  . APPENDECTOMY  1975  . CARDIAC CATHETERIZATION N/A 02/17/2016   Procedure: Left Heart Cath and Coronary Angiography;  Surgeon: Marykay Lex, MD;  Location: [facility] INVASIVE CV LAB;  Service: Cardiovascular;  Laterality: N/A;  . CARDIAC CATHETERIZATION N/A 02/17/2016   Procedure: Coronary Stent Intervention;  Surgeon: Marykay Lex, MD;  Location: [facility] INVASIVE CV LAB;  Service: Cardiovascular;  Laterality: N/A;  . CATARACT EXTRACTION  2010   b/l  . CORONARY STENT PLACEMENT  02/17/2016   Mid RCA lesion, 85 %stenosed.  Marland Kitchen RETINAL LASER PROCEDURE  1985  . TONSILLECTOMY AND ADENOIDECTOMY  1947  . VIDEO ASSISTED THORACOSCOPY (VATS)/THOROCOTOMY Right 01/24/2015   Procedure: VIDEO ASSISTED THORACOSCOPY (VATS) FOR DRAINAGE OF RIGHT HEMOTHORAX;  Surgeon: Alleen Borne, MD;  Location: [facility] OR;  Service: Thoracic;  Laterality: Right;    Current Medications: Outpatient Medications Prior to Visit  Medication Sig Dispense Refill  . acetaminophen (TYLENOL) 500 MG tablet Take 500 mg by mouth 3 (three) times daily as needed (pain).    Marland Kitchen amLODipine (NORVASC) 10 MG  tablet Take 1 tablet (10 mg total) by mouth daily. 30 tablet 3  . aspirin EC 81 MG EC tablet Take 1 tablet (81 mg total) by mouth daily. 30 tablet 3  . atorvastatin (LIPITOR) 80 MG tablet Take 1 tablet (80 mg total) by mouth daily. 30 tablet 6  . carvedilol (COREG) 12.5 MG tablet Take 1 tablet (12.5 mg total) by mouth 2 (two) times daily with a meal. 180 tablet 3  . cholecalciferol (VITAMIN D) 1000 units tablet Take 1,000 Units by mouth  daily.    . clopidogrel (PLAVIX) 75 MG tablet Take 1 tablet (75 mg total) by mouth daily with breakfast. 30 tablet 12  . fexofenadine (ALLEGRA) 30 MG tablet Take 30 mg by mouth daily.     . Fluticasone-Salmeterol (ADVAIR) 100-50 MCG/DOSE AEPB Inhale 1 puff into the lungs 2 (two) times daily. 1 each 3  . furosemide (LASIX) 40 MG tablet Take 40 mg in the morning and 20 mg in the evening 45 tablet 9  . glimepiride (AMARYL) 4 MG tablet Take 1 tablet (4 mg total) by mouth daily with breakfast. 30 tablet 3  . lisinopril (PRINIVIL,ZESTRIL) 40 MG tablet Take 1 tablet (40 mg total) by mouth daily. 30 tablet 3  . metFORMIN (GLUCOPHAGE) 1000 MG tablet TAKE 1 TABLET(1000 MG) BY MOUTH TWICE DAILY WITH A MEAL 180 tablet 0  . nitroGLYCERIN (NITROSTAT) 0.4 MG SL tablet Place 1 tablet (0.4 mg total) under the tongue every 5 (five) minutes as needed for chest pain. 25 tablet 2  . potassium chloride SA (K-DUR,KLOR-CON) 20 MEQ tablet Take 1 tablet (20 mEq total) by mouth daily. 30 tablet 1  . spironolactone (ALDACTONE) 25 MG tablet Take 1 tablet (25 mg total) by mouth daily. 90 tablet 3  . VENTOLIN HFA 108 (90 Base) MCG/ACT inhaler INHALE 2 PUFFS INTO THE LUNGS AS NEEDED (Patient taking differently: Inhale 2 puffs into the lungs every 6 (six) hours as needed for wheezing or shortness of breath. ) 18 g 0   No facility-administered medications prior to visit.      Allergies:   Review of patient's allergies indicates no known allergies.   Social History   Social History  . Marital status: Married    Spouse name: N/A  . Number of children: N/A  . Years of education: N/A   Social History Main Topics  . Smoking status: Never Smoker  . Smokeless tobacco: Never Used  . Alcohol use Yes     Comment: wine on weekends w/dinner  . Drug use: No  . Sexual activity: Yes    Partners: Female   Other Topics Concern  . None   Social History Narrative  . None     Family History:  The patient's family history is not  on file.   ROS:   Please see the history of present illness.    ROS All other systems reviewed and are negative.   PHYSICAL EXAM:   VS:  BP 135/75   Pulse 68   Ht 6' (1.829 m)   Wt 263 lb 6.4 oz (119.5 kg)   SpO2 99%   BMI 35.72 kg/m    GEN: Well nourished, well developed, in no acute distress  HEENT: normal  Neck: no JVD, carotid bruits, or masses Cardiac: RRR; no murmurs, rubs, or gallops,no edema  Respiratory:  clear to auscultation bilaterally, normal work of breathing GI: soft, nontender, nondistended, + BS MS: no deformity or atrophy  Skin: warm and dry, no rash  Neuro:  Alert and Oriented x 3, Strength and sensation are intact Psych: euthymic mood, full affect  Wt Readings from Last 3 Encounters:  03/15/16 263 lb 6.4 oz (119.5 kg)  02/25/16 273 lb 3.2 oz (123.9 kg)  02/17/16 250 lb (113.4 kg)      Studies/Labs Reviewed:   EKG:  EKG is not ordered today.  Recent Labs: 02/10/2016: B Natriuretic Peptide 542.5; Magnesium 2.1; TSH 1.542 02/18/2016: Hemoglobin 13.9; Platelets 167 03/03/2016: ALT 28; BUN 23; Creat 1.12; Potassium 4.6; Sodium 139   Lipid Panel    Component Value Date/Time   CHOL 205 (H) 03/03/2016 1153   TRIG 165 (H) 03/03/2016 1153   HDL 47 03/03/2016 1153   CHOLHDL 4.4 03/03/2016 1153   VLDL 33 (H) 03/03/2016 1153   LDLCALC 125 03/03/2016 1153   LDLDIRECT 199.5 04/17/2011 0820    Additional studies/ records that were reviewed today include:  Echo 02/12/2016 LV EF: 35% - 40%  Study Conclusions  - Left ventricle: The cavity size was normal. Wall thickness was increased in a pattern of mild LVH. Systolic function was moderately reduced. The estimated ejection fraction was in the range of 35% to 40%. Diffuse hypokinesis. Diastolic function is abnormal, indeterminate grade. There is evidence of elevated LA pressure. - Aortic valve: Mildly calcified annulus. Mildly thickened leaflets. There was mild stenosis. Mean gradient  (S): 10 mm Hg. Valve area (VTI): 1.51 cm^2. - Mitral valve: Mildly calcified annulus. Mildly thickened leaflets . There was mild regurgitation. - Left atrium: The atrium was mildly dilated. - Atrial septum: No defect or patent foramen ovale was identified. - Pulmonary arteries: Systolic pressure was mildly to moderately increased. PA peak pressure: 39 mm Hg (S). - Technically difficult study. Echocontrast was used to enhance visualization.   Cath 02/17/2016  Severe 3 vessel disease: mRCA, OM2 branch, D1- but only 1 PCI target  There is moderate left ventricular systolic dysfunction. The left ventricular ejection fraction is 35-45% by visual estimate.  LV end diastolic pressure is mildly elevated - 19 mmHg  Ost 1st Diag to 1st Diag lesion, 90 %stenosed. Ostial Lat 2nd Mrg lesion, 80 %stenosed. - Medical management  Prox LAD to Mid LAD lesion, 30 %stenosed. Dist LAD-1 lesion, 35 %stenosed. Dist LAD-2 lesion, 40 %stenosed.  Mid RCA lesion, 85 %stenosed.  Post intervention, there is a 0% residual stenosis.  A STENT PROMUS PREM MR [id number] drug eluting stent was successfully placed.  Although he had three-vessel disease, the only PCI amenable target is the 10 MRCA lesions. These were covered with 1 DES stent.  Otherwise he will be treated medically  Plan:  Based on his reduced ejection fraction mildly elevated LVEDP, I felt it best to monitor overnight early a.m. Discharge.  He will remain on aspirin plus Plavix for minimum one year.  Monitor blood pressure overnight, and consider potentially titrating up beta blocker prior to discharge based on heart rate  He was on Livalo as an outpatient, however probably call was ordered per formulary. Please convert back to Livalo for discharge      ASSESSMENT:    1. Acute on chronic combined systolic and diastolic heart failure (HCC)   2. Mixed hyperlipidemia   3. Coronary artery disease involving coronary bypass graft  of native heart without angina pectoris   4. Cardiomyopathy, ischemic   5. Essential hypertension   6. Type 2 diabetes mellitus with complication, without long-term current use of insulin (HCC)      PLAN:  In order of  problems listed above:  1. CAD: Noted to have three-vessel disease on recent cath after initially presented to the hospital with acute heart failure. He eventually underwent DES to mid RCA, still has 90% ostial D1 that is managed medically. Continue aspirin and the Plavix  2. Chronic combined systolic and diastolic heart failure: Recently increased his Lasix to 40 mg a.m. and 20 mg p.m. His acute heart failure symptom has completely resolved. We'll continue on current medication. Obtain basic metabolic panel.  3. Ischemic cardiomyopathy with EF 35-45%: Carvedilol 12.5 mg twice a day was titrated last time. Spironolactone spironolactone 25 mg daily. We'll defer to Dr. Herbie BaltimoreHarding to consider repeat echocardiogram in 3 month. Continue lisinopril for LV dysfunction.  4. HTN: Blood pressure 135/75 here, however at home, his blood pressure has been in the 120s systolic.  5. HLD: His Livalo was discontinued in favor of high-dose statin.  6. DM II: managed by PCP    Medication Adjustments/Labs and Tests Ordered: Current medicines are reviewed at length with the patient today.  Concerns regarding medicines are outlined above.  Medication changes, Labs and Tests ordered today are listed in the Patient Instructions below. Patient Instructions  Medication Instructions:  Your physician recommends that you continue on your current medications as directed. Please refer to the Current Medication list given to you today.  Labwork: Your physician [facility]  Your physician recommends that you return for lab work in: 6 WEEKS FASTING COMPLETE LIPIDS & LFT  Testing/Procedures: NONE   Follow-Up: Your physician recommends that you schedule a  follow-up appointment in: [facility]   Any Other Special Instructions Will Be Listed Below (If Applicable).     If you need a refill on your cardiac medications before your next appointment, please call your pharmacy.     Ramond DialSigned, David Huynh, [place]PA  03/15/2016 3:27 PM    [facility] [address], [place], [place]  [address] Phone: [phone]; Fax: [phone]

## 2016-04-14 ENCOUNTER — Other Ambulatory Visit: Payer: Self-pay | Admitting: Physician Assistant

## 2016-04-17 NOTE — Telephone Encounter (Signed)
Please review for refill. Thanks!  

## 2016-04-26 DIAGNOSIS — E782 Mixed hyperlipidemia: Secondary | ICD-10-CM | POA: Diagnosis not present

## 2016-04-27 LAB — LIPID PANEL
CHOL/HDL RATIO: 3.8 ratio (ref <5.0)
Cholesterol: 153 mg/dL (ref <200)
HDL: 40 mg/dL — AB (ref >40)
LDL Cholesterol: 59 mg/dL (ref <100)
Triglycerides: 268 mg/dL — ABNORMAL HIGH (ref <150)
VLDL: 54 mg/dL — AB (ref <30)

## 2016-04-27 LAB — HEPATIC FUNCTION PANEL
ALBUMIN: 4.4 g/dL (ref 3.6–5.1)
ALT: 23 U/L (ref 9–46)
AST: 20 U/L (ref 10–35)
Alkaline Phosphatase: 44 U/L (ref 40–115)
BILIRUBIN TOTAL: 0.6 mg/dL (ref 0.2–1.2)
Bilirubin, Direct: 0.1 mg/dL (ref <=0.2)
Indirect Bilirubin: 0.5 mg/dL (ref 0.2–1.2)
Total Protein: 7.1 g/dL (ref 6.1–8.1)

## 2016-04-28 ENCOUNTER — Other Ambulatory Visit: Payer: Self-pay | Admitting: *Deleted

## 2016-04-28 DIAGNOSIS — Z79899 Other long term (current) drug therapy: Secondary | ICD-10-CM

## 2016-04-28 DIAGNOSIS — E7849 Other hyperlipidemia: Secondary | ICD-10-CM

## 2016-04-28 MED ORDER — POTASSIUM CHLORIDE CRYS ER 20 MEQ PO TBCR: 20.0000 meq | EXTENDED_RELEASE_TABLET | Freq: Every day | ORAL | 1 refills | Status: DC

## 2016-05-04 ENCOUNTER — Other Ambulatory Visit: Payer: Self-pay | Admitting: Family Medicine

## 2016-05-04 DIAGNOSIS — E785 Hyperlipidemia, unspecified: Secondary | ICD-10-CM

## 2016-05-04 DIAGNOSIS — I10 Essential (primary) hypertension: Secondary | ICD-10-CM

## 2016-05-04 DIAGNOSIS — J4520 Mild intermittent asthma, uncomplicated: Secondary | ICD-10-CM

## 2016-05-04 DIAGNOSIS — E1165 Type 2 diabetes mellitus with hyperglycemia: Secondary | ICD-10-CM

## 2016-06-01 ENCOUNTER — Ambulatory Visit: Payer: Medicare Other | Admitting: Cardiology

## 2016-06-22 ENCOUNTER — Other Ambulatory Visit: Payer: Self-pay | Admitting: Family Medicine

## 2016-06-28 ENCOUNTER — Encounter: Payer: Self-pay | Admitting: Cardiology

## 2016-06-28 ENCOUNTER — Ambulatory Visit (INDEPENDENT_AMBULATORY_CARE_PROVIDER_SITE_OTHER): Payer: Medicare Other | Admitting: Cardiology

## 2016-06-28 VITALS — BP 120/68 | HR 69 | Ht 72.0 in | Wt 277.0 lb

## 2016-06-28 DIAGNOSIS — I5042 Chronic combined systolic (congestive) and diastolic (congestive) heart failure: Secondary | ICD-10-CM

## 2016-06-28 DIAGNOSIS — I2510 Atherosclerotic heart disease of native coronary artery without angina pectoris: Secondary | ICD-10-CM | POA: Diagnosis not present

## 2016-06-28 DIAGNOSIS — E785 Hyperlipidemia, unspecified: Secondary | ICD-10-CM

## 2016-06-28 DIAGNOSIS — I255 Ischemic cardiomyopathy: Secondary | ICD-10-CM

## 2016-06-28 DIAGNOSIS — I350 Nonrheumatic aortic (valve) stenosis: Secondary | ICD-10-CM | POA: Insufficient documentation

## 2016-06-28 DIAGNOSIS — I10 Essential (primary) hypertension: Secondary | ICD-10-CM

## 2016-06-28 DIAGNOSIS — I110 Hypertensive heart disease with heart failure: Secondary | ICD-10-CM

## 2016-06-28 DIAGNOSIS — Z79899 Other long term (current) drug therapy: Secondary | ICD-10-CM | POA: Diagnosis not present

## 2016-06-28 MED ORDER — AMLODIPINE BESYLATE 10 MG PO TABS: 10.0000 mg | ORAL_TABLET | Freq: Every day | ORAL | 3 refills | Status: DC

## 2016-06-28 MED ORDER — LISINOPRIL 40 MG PO TABS: 40.0000 mg | ORAL_TABLET | Freq: Every day | ORAL | 3 refills | Status: DC

## 2016-06-28 MED ORDER — CLOPIDOGREL BISULFATE 75 MG PO TABS: 75.0000 mg | ORAL_TABLET | Freq: Every day | ORAL | 3 refills | Status: DC

## 2016-06-28 NOTE — Progress Notes (Signed)
PCP: Danise Edge, MD  Clinic Note: Chief Complaint  Patient presents with  . Follow-up    2 months  . Coronary Artery Disease    Ischemic cardiomyopathy status post MI    HPI: GORO SHOWELL is a 76 y.o. male with a PMH below who presents today for Four-month follow-up for his CAD/ischemic cardiomyopathy with recent PCI to RCA back in October 2017.Marland Kitchen  DAIN JOSH was last seen on October 13 by Mr. Azalee Course, [place] for his TCM follow-up. - wgt was up 2 Lb. -- carvedilol increased to 12.5 mg twice a day. Lasix increased to 40/20 and spironolactone was added.  Recent Hospitalizations:   Admitted on 02/10/2016  -- Sx with Acute combined CHF - Cath with CAD-PCI to RCA.  Studies Reviewed:   2 D Echo: Mild LVH. Moderately reduced EF of 35-40% with diffuse hypokinesis. Indeterminate diastolic function. Likely elevated LA pressures. Mild aortic stenosis (mean gradient 10 mmHg). Mild MR. Mild LA dilation. Mild to moderate PA hypertension with 39 mmHg   Cath 02/17/16: Although he had three-vessel disease the only PCI amenable target is the tandem MRCA lesions. These were covered with 1 DES stent.  Severe 3 vessel disease: mRCA, OM2 branch, D1- but only 1 PCI target  There is moderate left ventricular systolic dysfunction. The left ventricular ejection fraction is 35-45% by visual estimate.  LV end diastolic pressure is mildly elevated - 19 mmHg  Ost 1st Diag to 1st Diag lesion, 90 %stenosed. Ostial Lat 2nd Mrg lesion, 80 %stenosed. - Medical management  Prox LAD to Mid LAD lesion, 30 %stenosed. Dist LAD-1 lesion, 35 %stenosed. Dist LAD-2 lesion, 40 %stenosed.  Mid RCA lesion, 85 %stenosed.  Post intervention, there is a 0% residual stenosis.  A STENT PROMUS PREM MR 3.5X28 drug eluting stent was successfully placed.   Diagnostic Diagram      Post-Intervention Diagram          STENT PROMUS PREM MR 3.5X28 DES          Interval History: Zyaire presents today saying he feels  great him better than he has before. Walks about 3-4 days a week 30-45 minutes at a time. Looks up and down hills without having any real significant dyspnea. He has however gained some weight although he denies any significant edema or PND/orthopnea. His weight is 10 pounds up since last visit. He has not had any recurrent anginal type chest tightness or pressure with rest or exertion. No rapid, irregular heartbeat/palpitations. No syncope/near-syncope or TIA/amaurosis fugax. No bleeding issues, melena, hematochezia or hematuria, epistaxis.   No claudication.  ROS: A comprehensive was performed. Review of Systems  Constitutional: Negative for weight loss (Weight gain).  HENT: Negative for congestion.   Respiratory: Negative for cough and shortness of breath.   Cardiovascular: Positive for leg swelling (Not really).  Gastrointestinal: Negative for abdominal pain and heartburn.  Genitourinary: Negative for frequency.  Musculoskeletal: Positive for joint pain (knees).  Skin: Negative.   Neurological: Negative for dizziness.  Psychiatric/Behavioral: Negative for memory loss. The patient is not nervous/anxious and does not have insomnia.   All other systems reviewed and are negative.   Past Medical History:  Diagnosis Date  . Allergy    seasonal  . Asthma    mild, intermittent  . CAD, multiple vessel    Three-vessel disease involving mid RCA 85% (PCI with DES), OM 2 80% in branch and ostD1 ~90%. Only the mid RCA with PCI target.   Marland Kitchen  Diabetes mellitus    type 2  . Fracture of multiple ribs 02/12/2015  . Hyperlipidemia   . Hypertension   . Hypocalcemia 02/12/2015  . Liver function study, abnormal 04/26/2011  . Multiple rib fractures   . Obesity     Past Surgical History:  Procedure Laterality Date  . APPENDECTOMY  1975  . CARDIAC CATHETERIZATION N/A 02/17/2016   Procedure: Left Heart Cath and Coronary Angiography;  Surgeon: Marykay Lex, MD;  Location: [facility] INVASIVE CV LAB;   Service: Cardiovascular: mRCA 85% (PCI), branch of OM2 80%,  ostD1 90%; EF 35-45%  . CARDIAC CATHETERIZATION N/A 02/17/2016   Procedure: Coronary Stent Intervention;  Surgeon: Marykay Lex, MD;  Location: [facility] INVASIVE CV LAB;  Service: Cardiovascular: mRCA PCI : STENT PROMUS PREM MR 3.5X28 DES  . CATARACT EXTRACTION  2010   b/l  . RETINAL LASER PROCEDURE  1985  . TONSILLECTOMY AND ADENOIDECTOMY  1947  . TRANSTHORACIC ECHOCARDIOGRAM  02/2016   Mild LVH. Moderately reduced EF of 35-40% with diffuse hypokinesis. Indeterminate diastolic function. Likely elevated LA pressures. Mild aortic stenosis (mean gradient 10 mmHg). Mild MR. Mild LA dilation. Mild to moderate PA hypertension with 39 mmHg  . VIDEO ASSISTED THORACOSCOPY (VATS)/THOROCOTOMY Right 01/24/2015   Procedure: VIDEO ASSISTED THORACOSCOPY (VATS) FOR DRAINAGE OF RIGHT HEMOTHORAX;  Surgeon: Alleen Borne, MD;  Location: [facility] OR;  Service: Thoracic;  Laterality: Right;    Current Meds  Medication Sig  . acetaminophen (TYLENOL) 500 MG tablet Take 500 mg by mouth 3 (three) times daily as needed (pain).  Marland Kitchen amLODipine (NORVASC) 10 MG tablet Take 1 tablet (10 mg total) by mouth daily.  Marland Kitchen aspirin EC 81 MG EC tablet Take 1 tablet (81 mg total) by mouth daily.  . carvedilol (COREG) 12.5 MG tablet Take 1 tablet (12.5 mg total) by mouth 2 (two) times daily with a meal.  . cholecalciferol (VITAMIN D) 1000 units tablet Take 1,000 Units by mouth daily.  . clopidogrel (PLAVIX) 75 MG tablet Take 1 tablet (75 mg total) by mouth daily with breakfast.  . fexofenadine (ALLEGRA) 30 MG tablet Take 30 mg by mouth daily.   . Fluticasone-Salmeterol (ADVAIR) 100-50 MCG/DOSE AEPB Inhale 1 puff into the lungs 2 (two) times daily.  . furosemide (LASIX) 40 MG tablet TAKE 1 TABLET IN THE MORNING AND 1/2 TABLET IN THE EVENING  . glimepiride (AMARYL) 1 MG tablet TAKE 1 TABLET(1 MG) BY MOUTH DAILY  . lisinopril (PRINIVIL,ZESTRIL) 40 MG tablet Take 1 tablet (40 mg total)  by mouth daily.  . metFORMIN (GLUCOPHAGE) 1000 MG tablet TAKE 1 TABLET(1000 MG) BY MOUTH TWICE DAILY WITH A MEAL  . nitroGLYCERIN (NITROSTAT) 0.4 MG SL tablet Place 1 tablet (0.4 mg total) under the tongue every 5 (five) minutes as needed for chest pain.  . potassium chloride SA (K-DUR,KLOR-CON) 20 MEQ tablet Take 1 tablet (20 mEq total) by mouth daily.  . VENTOLIN HFA 108 (90 Base) MCG/ACT inhaler INHALE 2 PUFFS INTO THE LUNGS AS NEEDED  . [DISCONTINUED] amLODipine (NORVASC) 10 MG tablet Take 1 tablet (10 mg total) by mouth daily.  . [DISCONTINUED] amLODipine (NORVASC) 10 MG tablet Take 1 tablet (10 mg total) by mouth daily.  . [DISCONTINUED] clopidogrel (PLAVIX) 75 MG tablet Take 1 tablet (75 mg total) by mouth daily with breakfast.  . [DISCONTINUED] lisinopril (PRINIVIL,ZESTRIL) 40 MG tablet Take 1 tablet (40 mg total) by mouth daily.    No Known Allergies  Social History   Social History  .  Marital status: Married    Spouse name: N/A  . Number of children: N/A  . Years of education: N/A   Social History Main Topics  . Smoking status: Never Smoker  . Smokeless tobacco: Never Used  . Alcohol use Yes     Comment: wine on weekends w/dinner  . Drug use: No  . Sexual activity: Yes    Partners: Female   Other Topics Concern  . None   Social History Narrative  . None    family history is not on file. FH - no known CAD, CHF.    Wt Readings from Last 3 Encounters:  06/28/16 125.6 kg (277 lb)  03/15/16 119.5 kg (263 lb 6.4 oz)  02/25/16 123.9 kg (273 lb 3.2 oz)    PHYSICAL EXAM BP 120/68   Pulse 69   Ht 6' (1.829 m)   Wt 125.6 kg (277 lb)   BMI 37.57 kg/m  General appearance: alert, cooperative, appears stated age, no distress and Moderately severely obese. Well groomed. Neck: no adenopathy, no carotid bruit and no JVD Lungs: clear to auscultation bilaterally, normal percussion bilaterally and non-labored Heart: RRR with normal S1 and S2. 2/6 SEM at RUSB. Does radiate  toward the carotids, but not throughout. Nondisplaced PMI. No other rubs or gallops. Abdomen: soft, non-tender; bowel sounds normal; no masses,  no organomegaly; truncal obesity, cannot tell if there is any edema. Extremities: extremities normal, atraumatic, no cyanosis, and edema - trace Pulses: 2+ and symmetric;  Skin: mobility and turgor normal, no evidence of bleeding or bruising and no lesions noted or  Neurologic: Mental status: Alert, oriented, thought content appropriate Cranial nerves: normal (II-XII grossly intact)    Adult ECG Report n/a  Other studies Reviewed: Additional studies/ records that were reviewed today include:  Recent Labs:   Lab Results  Component Value Date   CHOL 153 04/26/2016   HDL 40 (L) 04/26/2016   LDLCALC 59 04/26/2016   LDLDIRECT 199.5 04/17/2011   TRIG 268 (H) 04/26/2016   CHOLHDL 3.8 04/26/2016     ASSESSMENT / PLAN: Problem List Items Addressed This Visit    Aortic stenosis, mild (Chronic)    Can reassess with follow-up echocardiogram. Then depending on the severity, would probably check annually.      Relevant Medications   lisinopril (PRINIVIL,ZESTRIL) 40 MG tablet   amLODipine (NORVASC) 10 MG tablet   Other Relevant Orders   ECHOCARDIOGRAM COMPLETE   Lipid panel   Comprehensive metabolic panel   CAD, multiple vessel (Chronic)    2 vessel involvement, but only rarely one lesion for PCI. Thankfully he seems to be doing feeling quite well since that timeframe. No recurrent anginal or heart failure symptoms. His other lesions are probably not PCI amenable location. He is on aspirin, Plavix as well as carvedilol and lisinopril essentially a target dose. He is also on 10 mg of amlodipine for additional antianginal effect. On high-dose atorvastatin.      Relevant Medications   lisinopril (PRINIVIL,ZESTRIL) 40 MG tablet   amLODipine (NORVASC) 10 MG tablet   Cardiomyopathy, ischemic - Primary (Chronic)    Quite honestly unless his EF  improves, normal expected is ischemic or not he is out of proportion to the level of CAD. He does not have any LAD disease or Main Channel circumflex disease to go along with the RCA disease. Due for follow-up echo now he has been on medications. On stable dose of diuretic but with weight gain we are increasing for short-term.  Relevant Medications   lisinopril (PRINIVIL,ZESTRIL) 40 MG tablet   amLODipine (NORVASC) 10 MG tablet   Other Relevant Orders   ECHOCARDIOGRAM COMPLETE   Lipid panel   Comprehensive metabolic panel   Chronic combined systolic and diastolic CHF, NYHA class 2 (HCC) (Chronic)    He doesn't seem to be volume overloaded with minimal edema, but his weight is definitely gone up. He does indicate that he probably overindulge during Christmas, but this is still higher than I would expect.  PLAN: For now this increase Lasix to 40 mg twice a day for the next week and then reduced back to 40/20.  Recheck 2-D echocardiogram to reassess EF.      Relevant Medications   lisinopril (PRINIVIL,ZESTRIL) 40 MG tablet   amLODipine (NORVASC) 10 MG tablet   Other Relevant Orders   ECHOCARDIOGRAM COMPLETE   Lipid panel   Comprehensive metabolic panel   Essential hypertension (Chronic)    Pretty well-controlled currently medications.      Relevant Medications   lisinopril (PRINIVIL,ZESTRIL) 40 MG tablet   amLODipine (NORVASC) 10 MG tablet   Other Relevant Orders   ECHOCARDIOGRAM COMPLETE   Lipid panel   Comprehensive metabolic panel   Hyperlipidemia with target LDL less than 70 (Chronic)    Doing well on high-dose statin. Labs are being followed by PCP - but he requests that we order lipids b/c of being on Atorvastatin.      Relevant Medications   lisinopril (PRINIVIL,ZESTRIL) 40 MG tablet   amLODipine (NORVASC) 10 MG tablet   Hypertensive heart disease (Chronic)   Relevant Medications   lisinopril (PRINIVIL,ZESTRIL) 40 MG tablet   amLODipine (NORVASC) 10 MG tablet     Other Relevant Orders   ECHOCARDIOGRAM COMPLETE   Lipid panel   Comprehensive metabolic panel    Other Visit Diagnoses    Medication management       Relevant Orders   Lipid panel   Comprehensive metabolic panel   Hyperlipidemia LDL goal <70       Relevant Medications   lisinopril (PRINIVIL,ZESTRIL) 40 MG tablet   amLODipine (NORVASC) 10 MG tablet   Other Relevant Orders   Lipid panel   Comprehensive metabolic panel      Current medicines are reviewed at length with the patient today. (+/- concerns) concerned about weight The following changes have been made: --   Patient Instructions  TAKE LASIX ( FUROSEMIDE ) 40 MG  TWICE A DAY  FOR 1 WEEK THEN RETURN REGULAR DOSE. Sliding scale Lasix: Weigh yourself when you get home, then Daily in the Morning. Your dry weight will be what your scale says on the day you return home.(here is 277 lbs.).   If you gain more than 3 pounds from dry weight: Increase the Lasix dosing to 40 mg in the morning and 40 mg in the afternoon until weight returns to baseline dry weight.    MEDICATION REFILLED  90 DAY    SCHEDULE [address] Your physician has requested that you have an echocardiogram. Echocardiography is a painless test that uses sound waves to create images of your heart. It provides your doctor with information about the size and shape of your heart and how well your heart's chambers and valves are working. This procedure takes approximately one hour. There are no restrictions for this procedure.  LABS CMP ,LIPID DO PRIOR TO YOUR APPOINTMENT WITH PHYSICIAN ASSISTANT - DO NOT EAT OR DRINK THE MORNING OF THE TEST- WILL MAIL LAB  SLIP  Your physician recommends that you schedule a follow-up appointment in 6 MONTHS WITH H. MENG PA    Your physician wants you to follow-up in NOV- DEC 2018 with DR HARDING. You will receive a reminder letter in the mail two months in advance. If you don't receive a letter, please  call our office to schedule the follow-up appointment.   If you need a refill on your cardiac medications before your next appointment, please call your pharmacy.    Studies Ordered:   Orders Placed This Encounter  Procedures  . Lipid panel  . Comprehensive metabolic panel  . ECHOCARDIOGRAM COMPLETE      [name], M.D., M.S. Interventional Cardiologist   Pager # [phone] Phone # [phone] [address] [place], [place] [address]

## 2016-06-28 NOTE — Patient Instructions (Addendum)
TAKE LASIX ( FUROSEMIDE ) 40 MG  TWICE A DAY  FOR 1 WEEK THEN RETURN REGULAR DOSE. Sliding scale Lasix: Weigh yourself when you get home, then Daily in the Morning. Your dry weight will be what your scale says on the day you return home.(here is 277 lbs.).   If you gain more than 3 pounds from dry weight: Increase the Lasix dosing to 40 mg in the morning and 40 mg in the afternoon until weight returns to baseline dry weight.    MEDICATION REFILLED  90 DAY    SCHEDULE [address] Your physician has requested that you have an echocardiogram. Echocardiography is a painless test that uses sound waves to create images of your heart. It provides your doctor with information about the size and shape of your heart and how well your heart's chambers and valves are working. This procedure takes approximately one hour. There are no restrictions for this procedure.  LABS CMP ,LIPID DO PRIOR TO YOUR APPOINTMENT WITH PHYSICIAN ASSISTANT - DO NOT EAT OR DRINK THE MORNING OF THE TEST- WILL MAIL LAB SLIP  Your physician recommends that you schedule a follow-up appointment in 6 MONTHS WITH H. MENG PA    Your physician wants you to follow-up in NOV- DEC 2018 with DR HARDING. You will receive a reminder letter in the mail two months in advance. If you don't receive a letter, please call our office to schedule the follow-up appointment.   If you need a refill on your cardiac medications before your next appointment, please call your pharmacy.

## 2016-06-30 ENCOUNTER — Encounter: Payer: Self-pay | Admitting: Cardiology

## 2016-06-30 DIAGNOSIS — I2510 Atherosclerotic heart disease of native coronary artery without angina pectoris: Secondary | ICD-10-CM | POA: Insufficient documentation

## 2016-06-30 NOTE — Assessment & Plan Note (Signed)
Pretty well-controlled currently medications.

## 2016-06-30 NOTE — Assessment & Plan Note (Signed)
2 vessel involvement, but only rarely one lesion for PCI. Thankfully he seems to be doing feeling quite well since that timeframe. No recurrent anginal or heart failure symptoms. His other lesions are probably not PCI amenable location. He is on aspirin, Plavix as well as carvedilol and lisinopril essentially a target dose. He is also on 10 mg of amlodipine for additional antianginal effect. On high-dose atorvastatin.

## 2016-06-30 NOTE — Assessment & Plan Note (Signed)
We discussed DASH & Mediterranean diet. Watching carbohydrate intake as well as fatty foods. Continue exercise.

## 2016-06-30 NOTE — Assessment & Plan Note (Addendum)
Doing well on high-dose statin. Labs are being followed by PCP - but he requests that we order lipids b/c of being on Atorvastatin.

## 2016-06-30 NOTE — Assessment & Plan Note (Signed)
He doesn't seem to be volume overloaded with minimal edema, but his weight is definitely gone up. He does indicate that he probably overindulge during Christmas, but this is still higher than I would expect.  PLAN: For now this increase Lasix to 40 mg twice a day for the next week and then reduced back to 40/20.  Recheck 2-D echocardiogram to reassess EF.

## 2016-06-30 NOTE — Assessment & Plan Note (Signed)
Quite honestly unless his EF improves, normal expected is ischemic or not he is out of proportion to the level of CAD. He does not have any LAD disease or Main Channel circumflex disease to go along with the RCA disease. Due for follow-up echo now he has been on medications. On stable dose of diuretic but with weight gain we are increasing for short-term.

## 2016-06-30 NOTE — Assessment & Plan Note (Signed)
Can reassess with follow-up echocardiogram. Then depending on the severity, would probably check annually.

## 2016-07-02 IMAGING — DX DG CHEST 2V
2 series · 2 of 2 positions shown · non-contrast
Comparison: 01/28/2015.

CLINICAL DATA: Asthma.  Chest tube removal.

EXAM:
CHEST  2 VIEW

[w chest pa]
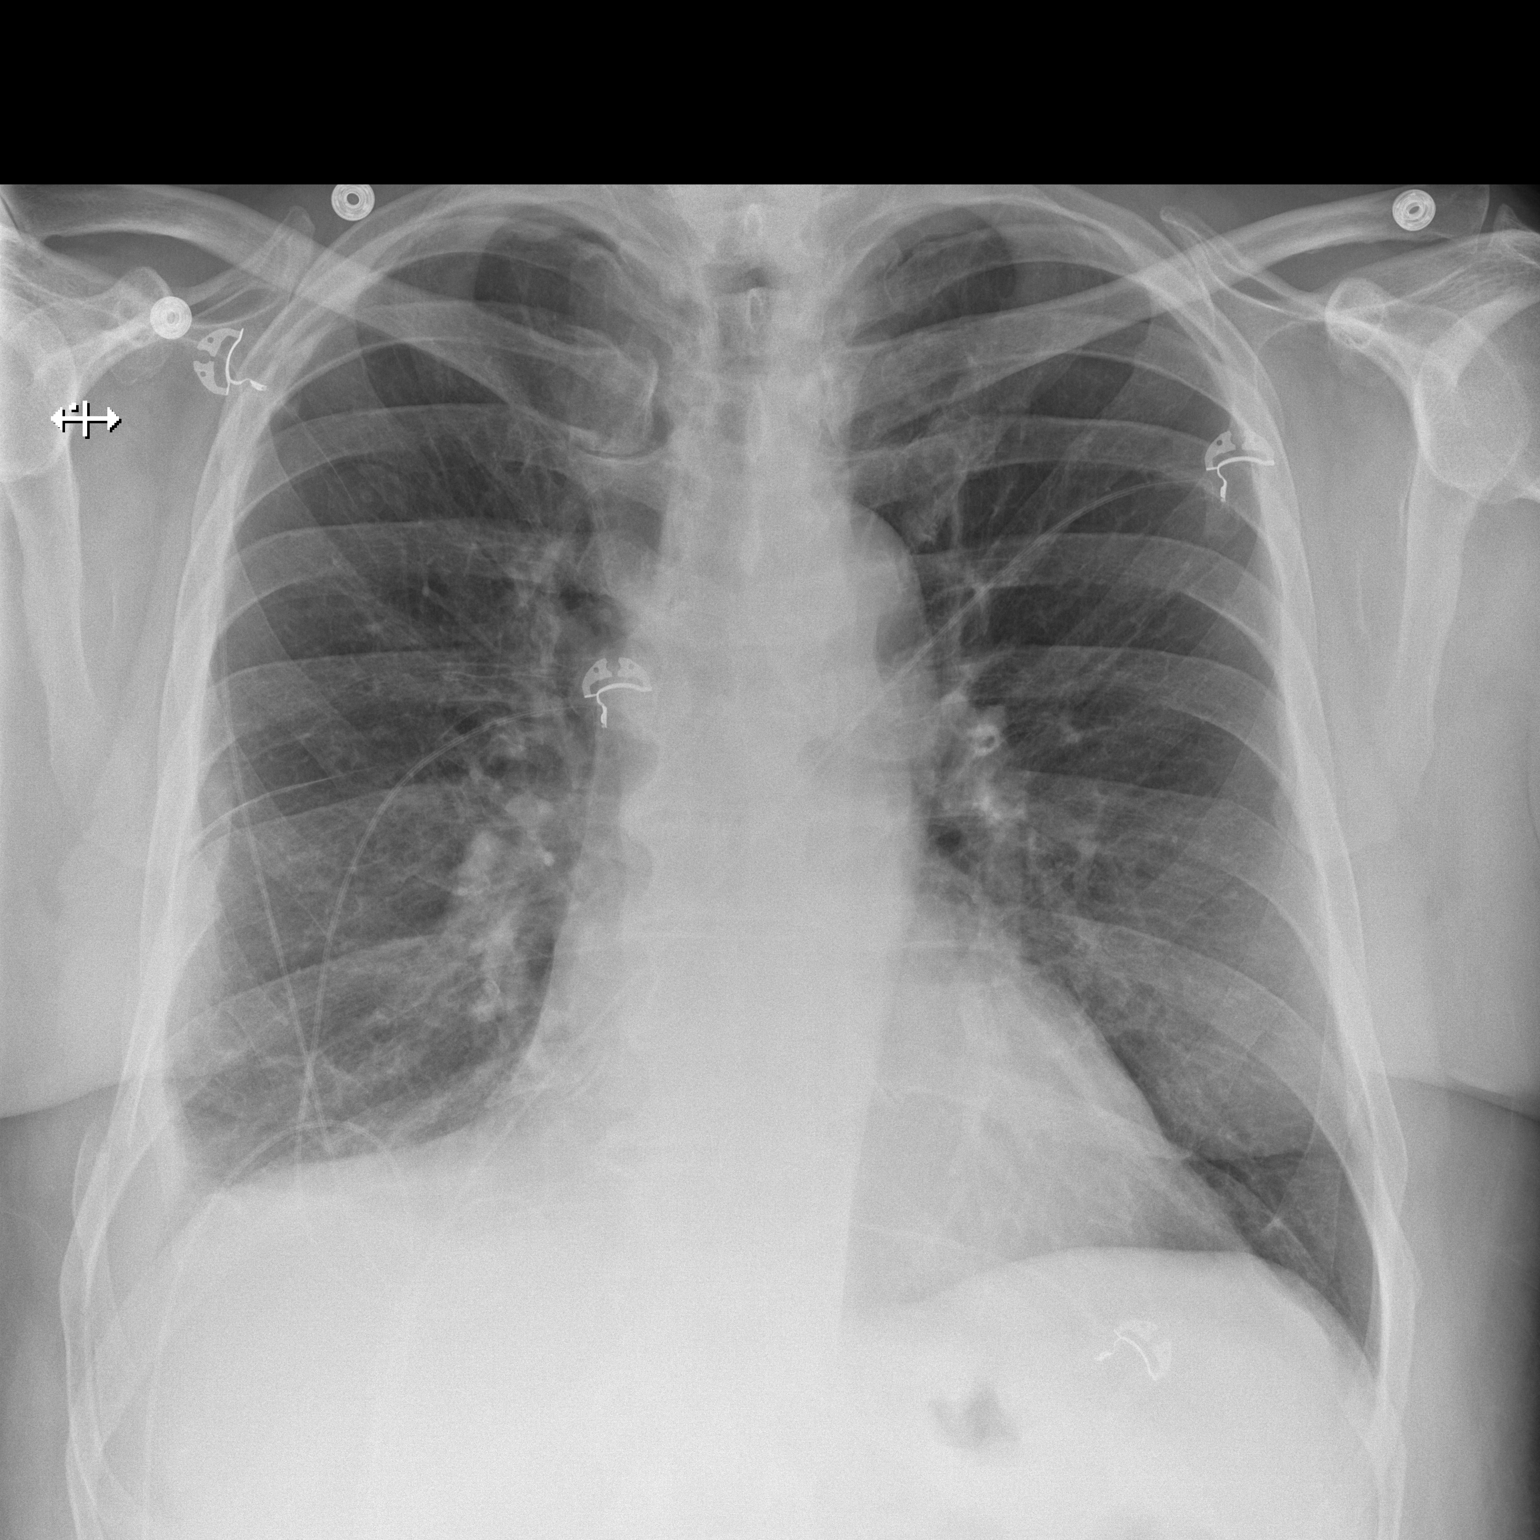

[w chest lat]
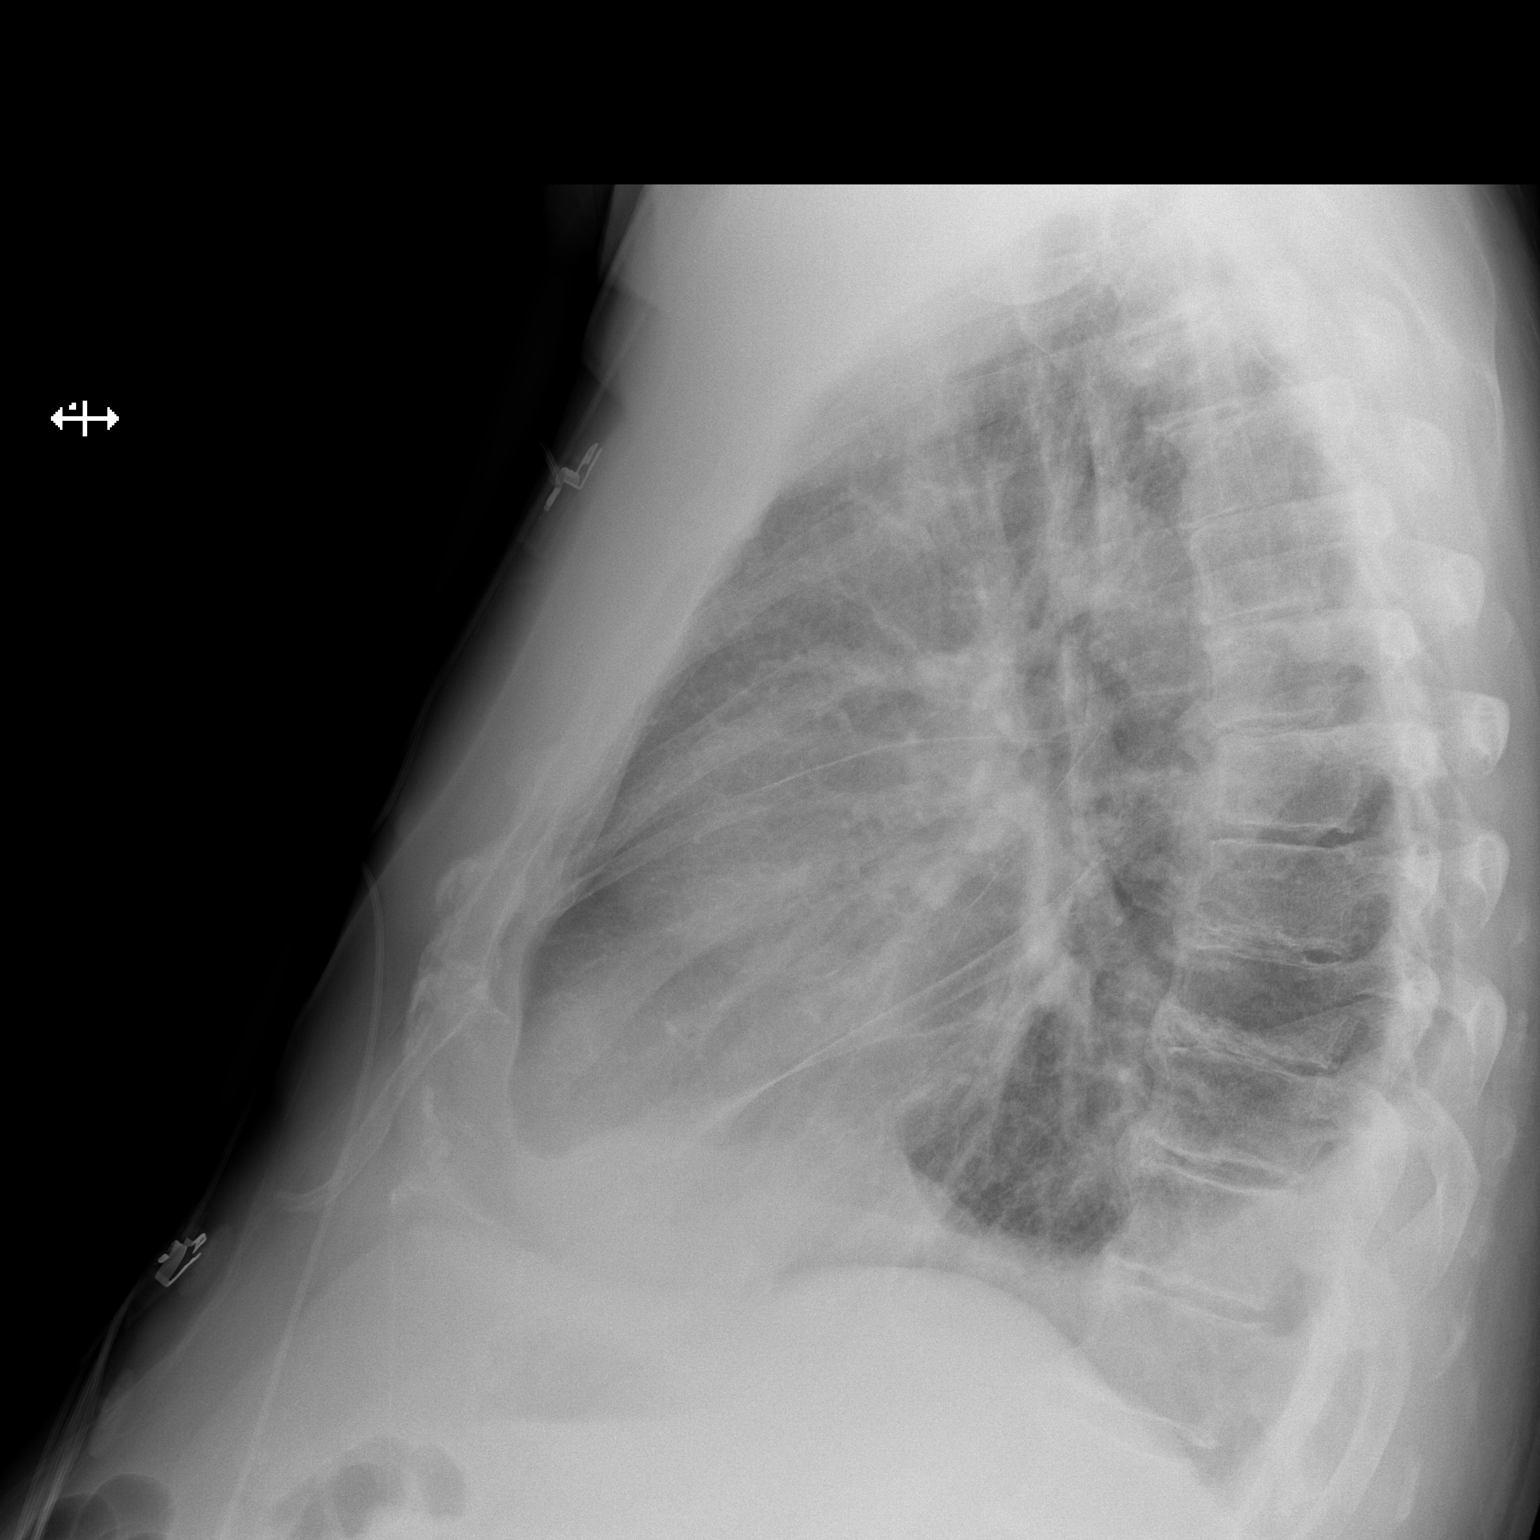

[2 of 2 positions shown; findings below may reference images not displayed]

FINDINGS: Interim removal of right chest tube. No residual pneumothorax.
Mediastinum hilar structures normal. Stable cardiomegaly. Bibasilar
subsegmental atelectasis. Small right pleural effusion. No
pneumothorax. Multiple right rib fractures again noted.
IMPRESSION: 1. Interim removal right chest tube. No pneumothorax. Multiple right
rib fractures again noted.
2. Basilar subsegmental atelectasis.  Small right pleural effusion.

## 2016-07-03 ENCOUNTER — Encounter (HOSPITAL_COMMUNITY): Payer: Self-pay | Admitting: Family Medicine

## 2016-07-17 ENCOUNTER — Other Ambulatory Visit: Payer: Self-pay

## 2016-07-17 ENCOUNTER — Ambulatory Visit (HOSPITAL_COMMUNITY): Payer: Medicare Other | Attending: Cardiovascular Disease

## 2016-07-17 DIAGNOSIS — I5042 Chronic combined systolic (congestive) and diastolic (congestive) heart failure: Secondary | ICD-10-CM

## 2016-07-17 DIAGNOSIS — I340 Nonrheumatic mitral (valve) insufficiency: Secondary | ICD-10-CM | POA: Diagnosis not present

## 2016-07-17 DIAGNOSIS — I350 Nonrheumatic aortic (valve) stenosis: Secondary | ICD-10-CM | POA: Diagnosis not present

## 2016-07-17 DIAGNOSIS — I10 Essential (primary) hypertension: Secondary | ICD-10-CM | POA: Diagnosis not present

## 2016-07-17 DIAGNOSIS — I255 Ischemic cardiomyopathy: Secondary | ICD-10-CM

## 2016-07-17 DIAGNOSIS — I110 Hypertensive heart disease with heart failure: Secondary | ICD-10-CM

## 2016-07-17 DIAGNOSIS — I501 Left ventricular failure: Secondary | ICD-10-CM | POA: Diagnosis not present

## 2016-07-17 LAB — ECHOCARDIOGRAM COMPLETE
E/e' ratio: 12.07
EWDT: 264 ms
FS: 29 % (ref 28–44)
IVS/LV PW RATIO, ED: 0.91
LA ID, A-P, ES: 40 mm
LA diam end sys: 40 mm
LA diam index: 1.63 cm/m2
LAVOL: 55.2 mL
LAVOLA4C: 38.3 mL
LAVOLIN: 22.5 mL/m2
LDCA: 4.15 cm2
LV E/e'average: 12.07
LV TDI E'MEDIAL: 4.7
LV e' LATERAL: 5.51 cm/s
LVEEMED: 12.07
LVOTD: 23 mm
MV Dec: 264
MV pk A vel: 83.4 m/s
MV pk E vel: 66.5 m/s
PW: 11.2 mm — AB (ref 0.6–1.1)
RV LATERAL S' VELOCITY: 11.2 cm/s
TDI e' lateral: 5.51

## 2016-07-17 MED ORDER — PERFLUTREN LIPID MICROSPHERE
1.0000 mL | INTRAVENOUS | Status: AC | PRN
  Administered 2016-07-17: 2 mL via INTRAVENOUS

## 2016-07-19 ENCOUNTER — Telehealth: Payer: Self-pay | Admitting: *Deleted

## 2016-07-19 ENCOUNTER — Other Ambulatory Visit: Payer: Self-pay | Admitting: Family Medicine

## 2016-07-19 DIAGNOSIS — I10 Essential (primary) hypertension: Secondary | ICD-10-CM

## 2016-07-19 DIAGNOSIS — J4520 Mild intermittent asthma, uncomplicated: Secondary | ICD-10-CM

## 2016-07-19 DIAGNOSIS — E1165 Type 2 diabetes mellitus with hyperglycemia: Secondary | ICD-10-CM

## 2016-07-19 DIAGNOSIS — E785 Hyperlipidemia, unspecified: Secondary | ICD-10-CM

## 2016-07-19 NOTE — Telephone Encounter (Signed)
Pt declined to schedule AWV and follow-up w/ PCP at time of call. Stated he did not have his calendar and would call back to schedule.

## 2016-11-16 ENCOUNTER — Telehealth: Payer: Self-pay | Admitting: *Deleted

## 2016-11-16 DIAGNOSIS — I350 Nonrheumatic aortic (valve) stenosis: Secondary | ICD-10-CM

## 2016-11-16 DIAGNOSIS — E785 Hyperlipidemia, unspecified: Secondary | ICD-10-CM

## 2016-11-16 DIAGNOSIS — I5042 Chronic combined systolic (congestive) and diastolic (congestive) heart failure: Secondary | ICD-10-CM

## 2016-11-16 DIAGNOSIS — I110 Hypertensive heart disease with heart failure: Secondary | ICD-10-CM

## 2016-11-16 DIAGNOSIS — I10 Essential (primary) hypertension: Secondary | ICD-10-CM

## 2016-11-16 DIAGNOSIS — I255 Ischemic cardiomyopathy: Secondary | ICD-10-CM

## 2016-11-16 DIAGNOSIS — Z79899 Other long term (current) drug therapy: Secondary | ICD-10-CM

## 2016-11-16 NOTE — Telephone Encounter (Signed)
Mail letter and labslip  

## 2016-11-16 NOTE — Telephone Encounter (Signed)
-----   Message from Tobin Chad, RN sent at 06/28/2016  4:39 PM EST ----- NEED LABS  CMP ,LIPID  DONE 5-6 MONTHS DUE IN July OR AUG 2018 BEFORE APPT. WITH MENG P.A. WILL MAIL IN July 2018

## 2016-11-30 ENCOUNTER — Other Ambulatory Visit: Payer: Self-pay | Admitting: Family Medicine

## 2016-11-30 DIAGNOSIS — J4520 Mild intermittent asthma, uncomplicated: Secondary | ICD-10-CM

## 2016-11-30 DIAGNOSIS — E1165 Type 2 diabetes mellitus with hyperglycemia: Secondary | ICD-10-CM

## 2016-11-30 DIAGNOSIS — I10 Essential (primary) hypertension: Secondary | ICD-10-CM

## 2016-11-30 DIAGNOSIS — E785 Hyperlipidemia, unspecified: Secondary | ICD-10-CM

## 2016-12-21 ENCOUNTER — Other Ambulatory Visit: Payer: Self-pay | Admitting: Family Medicine

## 2016-12-21 DIAGNOSIS — I10 Essential (primary) hypertension: Secondary | ICD-10-CM

## 2016-12-21 DIAGNOSIS — E785 Hyperlipidemia, unspecified: Secondary | ICD-10-CM

## 2016-12-21 DIAGNOSIS — E1165 Type 2 diabetes mellitus with hyperglycemia: Secondary | ICD-10-CM

## 2016-12-21 DIAGNOSIS — J4520 Mild intermittent asthma, uncomplicated: Secondary | ICD-10-CM

## 2016-12-25 DIAGNOSIS — Z79899 Other long term (current) drug therapy: Secondary | ICD-10-CM | POA: Diagnosis not present

## 2016-12-25 DIAGNOSIS — I10 Essential (primary) hypertension: Secondary | ICD-10-CM | POA: Diagnosis not present

## 2016-12-25 DIAGNOSIS — I110 Hypertensive heart disease with heart failure: Secondary | ICD-10-CM | POA: Diagnosis not present

## 2016-12-25 DIAGNOSIS — I255 Ischemic cardiomyopathy: Secondary | ICD-10-CM | POA: Diagnosis not present

## 2016-12-25 DIAGNOSIS — I5042 Chronic combined systolic (congestive) and diastolic (congestive) heart failure: Secondary | ICD-10-CM | POA: Diagnosis not present

## 2016-12-25 DIAGNOSIS — I350 Nonrheumatic aortic (valve) stenosis: Secondary | ICD-10-CM | POA: Diagnosis not present

## 2016-12-25 DIAGNOSIS — E785 Hyperlipidemia, unspecified: Secondary | ICD-10-CM | POA: Diagnosis not present

## 2016-12-26 ENCOUNTER — Other Ambulatory Visit: Payer: Self-pay

## 2016-12-26 ENCOUNTER — Telehealth: Payer: Self-pay | Admitting: Cardiology

## 2016-12-26 DIAGNOSIS — E875 Hyperkalemia: Secondary | ICD-10-CM

## 2016-12-26 DIAGNOSIS — E119 Type 2 diabetes mellitus without complications: Secondary | ICD-10-CM

## 2016-12-26 LAB — LIPID PANEL
CHOL/HDL RATIO: 7.4 ratio — AB (ref <5.0)
Cholesterol: 274 mg/dL — ABNORMAL HIGH (ref <200)
HDL: 37 mg/dL — ABNORMAL LOW (ref >40)
Triglycerides: 517 mg/dL — ABNORMAL HIGH (ref <150)

## 2016-12-26 LAB — COMPREHENSIVE METABOLIC PANEL
ALK PHOS: 48 U/L (ref 40–115)
ALT: 24 U/L (ref 9–46)
AST: 18 U/L (ref 10–35)
Albumin: 4.2 g/dL (ref 3.6–5.1)
BUN: 47 mg/dL — AB (ref 7–25)
CHLORIDE: 101 mmol/L (ref 98–110)
CO2: 24 mmol/L (ref 20–32)
Calcium: 9.5 mg/dL (ref 8.6–10.3)
Creat: 1.76 mg/dL — ABNORMAL HIGH (ref 0.70–1.18)
GLUCOSE: 181 mg/dL — AB (ref 65–99)
POTASSIUM: 5.4 mmol/L — AB (ref 3.5–5.3)
SODIUM: 136 mmol/L (ref 135–146)
Total Bilirubin: 0.4 mg/dL (ref 0.2–1.2)
Total Protein: 6.8 g/dL (ref 6.1–8.1)

## 2016-12-26 LAB — LDL CHOLESTEROL, DIRECT: LDL DIRECT: 133 mg/dL — AB (ref <100)

## 2016-12-26 MED ORDER — ATORVASTATIN CALCIUM 80 MG PO TABS: 80.0000 mg | ORAL_TABLET | Freq: Every day | ORAL | 3 refills | Status: DC

## 2016-12-26 NOTE — Telephone Encounter (Signed)
Returned call to patient lab results given. 

## 2016-12-26 NOTE — Telephone Encounter (Signed)
Returning your call. °

## 2016-12-29 DIAGNOSIS — E875 Hyperkalemia: Secondary | ICD-10-CM | POA: Diagnosis not present

## 2016-12-29 DIAGNOSIS — E119 Type 2 diabetes mellitus without complications: Secondary | ICD-10-CM | POA: Diagnosis not present

## 2016-12-29 LAB — BASIC METABOLIC PANEL
BUN / CREAT RATIO: 20 (ref 10–24)
BUN: 25 mg/dL (ref 8–27)
CO2: 22 mmol/L (ref 20–29)
CREATININE: 1.24 mg/dL (ref 0.76–1.27)
Calcium: 9.8 mg/dL (ref 8.6–10.2)
Chloride: 99 mmol/L (ref 96–106)
GFR calc Af Amer: 65 mL/min/{1.73_m2} (ref >59)
GFR, EST NON AFRICAN AMERICAN: 57 mL/min/{1.73_m2} — AB (ref >59)
Glucose: 226 mg/dL — ABNORMAL HIGH (ref 65–99)
Potassium: 5.1 mmol/L (ref 3.5–5.2)
SODIUM: 135 mmol/L (ref 134–144)

## 2016-12-30 LAB — HEMOGLOBIN A1C
Est. average glucose Bld gHb Est-mCnc: 206 mg/dL
Hgb A1c MFr Bld: 8.8 % — ABNORMAL HIGH (ref 4.8–5.6)

## 2017-01-01 MED ORDER — GLIMEPIRIDE 1 MG PO TABS: 1.0000 mg | ORAL_TABLET | Freq: Two times a day (BID) | ORAL | 3 refills | Status: DC

## 2017-01-01 NOTE — Addendum Note (Signed)
Addended by: Crissie Sickles A on: 01/01/2017 03:03 PM   Modules accepted: Orders

## 2017-01-02 ENCOUNTER — Other Ambulatory Visit: Payer: Self-pay | Admitting: Physician Assistant

## 2017-01-02 NOTE — Telephone Encounter (Signed)
This is Dr. Harding's pt. °

## 2017-01-02 NOTE — Telephone Encounter (Signed)
Please review for refill. Thanks!  

## 2017-01-08 ENCOUNTER — Other Ambulatory Visit: Payer: Self-pay | Admitting: Family Medicine

## 2017-01-09 ENCOUNTER — Other Ambulatory Visit: Payer: Self-pay | Admitting: Family Medicine

## 2017-01-09 ENCOUNTER — Encounter: Payer: Self-pay | Admitting: Physician Assistant

## 2017-01-09 ENCOUNTER — Ambulatory Visit (INDEPENDENT_AMBULATORY_CARE_PROVIDER_SITE_OTHER): Payer: Medicare Other | Admitting: Physician Assistant

## 2017-01-09 VITALS — BP 130/76 | HR 88 | Ht 72.0 in | Wt 272.8 lb

## 2017-01-09 DIAGNOSIS — E119 Type 2 diabetes mellitus without complications: Secondary | ICD-10-CM

## 2017-01-09 DIAGNOSIS — Z79899 Other long term (current) drug therapy: Secondary | ICD-10-CM

## 2017-01-09 DIAGNOSIS — I10 Essential (primary) hypertension: Secondary | ICD-10-CM | POA: Diagnosis not present

## 2017-01-09 DIAGNOSIS — I2510 Atherosclerotic heart disease of native coronary artery without angina pectoris: Secondary | ICD-10-CM | POA: Diagnosis not present

## 2017-01-09 DIAGNOSIS — E785 Hyperlipidemia, unspecified: Secondary | ICD-10-CM | POA: Diagnosis not present

## 2017-01-09 DIAGNOSIS — I255 Ischemic cardiomyopathy: Secondary | ICD-10-CM

## 2017-01-09 MED ORDER — METFORMIN HCL 1000 MG PO TABS: 1000.0000 mg | ORAL_TABLET | Freq: Two times a day (BID) | ORAL | 0 refills | Status: DC

## 2017-01-09 MED ORDER — CARVEDILOL 25 MG PO TABS: 25.0000 mg | ORAL_TABLET | Freq: Two times a day (BID) | ORAL | 3 refills | Status: DC

## 2017-01-09 NOTE — Patient Instructions (Signed)
Medication Instructions:  STOP- Potassium and Spironolactone INCREASE- Carvedilol 25 mg twice a day   If you need a refill on your cardiac medications before your next appointment, please call your pharmacy.  Labwork: BMP in 2-3 weeks and fasting Lipid Liver in 6-8 weeks HERE IN OUR OFFICE AT [facility]  Testing/Procedures: None Ordered  Follow-Up: Your physician wants you to follow-up in: December with Dr Herbie Baltimore.    Thank you for choosing [facility] at [facility]!!

## 2017-01-09 NOTE — Progress Notes (Signed)
Cardiology Office Note    Date:  01/10/2017   ID:  [name], DOB 18-Mar-1941, MRN [id number]  PCP:  Bradd Canary, MD  Cardiologist:  Dr. Herbie Baltimore  Chief Complaint  Patient presents with  . Follow-up    seen for Dr. Herbie Baltimore.     History of Present Illness:  David Huynh is a 76 y.o. male with PMH of HTN, HLD, DM, asthma who was initially admitted on 02/10/2016 with cough and bilateral lower extremity edema. 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 Echocardiogram obtained on 02/12/2016 showed EF 35-40%, mild LVH, diffuse hypokinesis, indeterminate diastolic dysfunction, PA peak pressure 39 mmHg. He was treated for acute on chronic combined systolic and diastolic heart failure. He was discharged on 02/13/2016 on 40 mg daily of Lasix with plan for elective cardiac catheterization and a later time. He underwent outpatient cardiac catheterization on 06/20/2015 which showed severe three-vessel disease with 90% ostial D1 stenosis, 80% ostial second marginal stenosis, 40% distal LAD stenosis, 85% mid RCA stenosis treated with 3.5 x 28 mm DES, EF 35-45%. The diagonal and lateral marginal lesion being managed medically. He was placed on aspirin and Plavix with plan to continue for at least one year. Spironolactone was added on top of ACE inhibitor and carvedilol.  I last saw the patient on 03/15/2016, his diuretic was increased to 40 mg a.m./20 mg p.m. of Lasix. I also increased his carvedilol to 12.5 mg twice a day and switched him to Lipitor 80 mg daily. He was last seen by Dr. Herbie Baltimore in February 2018. Repeat echocardiogram obtained in March 2018 showed EF 55-60%, grade 1 DD, trivial mitral regurgitation. Recent lab work obtained on 12/25/2016 showed renal function significantly worsened. He also had elevated potassium level. Therefore spironolactone and a sample was held, lisinopril was later restarted. Repeat lab work obtained on 12/29/2016  showed renal function has improved. Lipid profile obtained recently however showed cholesterol 274, HDL 37, LDL unable to be calculated, triglyceride 517.  He presents today for cardiology office visit. He did hold potassium, Lasix, spironolactone and metformin for a week before rechecking his renal function which did improve. However since then he has restarted on all of them. I will discontinue the spironolactone and potassium supplement today. He is back on Lasix and metformin. I will repeat a basic metabolic panel in 2-3 weeks. Otherwise, he says he missed read the label his medication bottles and thought his Plavix was Lipitor, therefore he think he may not have been taking the Lipitor for the past 6 months. We will give him another trial on the Lipitor and recheck fasting lipid panel in 6-8 weeks. He will follow-up with Dr. Herbie Baltimore in December.    Past Medical History:  Diagnosis Date  . Allergy    seasonal  . Asthma    mild, intermittent  . CAD, multiple vessel    Three-vessel disease involving mid RCA 85% (PCI with DES), OM 2 80% in branch and ostD1 ~90%. Only the mid RCA with PCI target.   . Diabetes mellitus    type 2  . Fracture of multiple ribs 02/12/2015  . Hyperlipidemia   . Hypertension   . Hypocalcemia 02/12/2015  . Liver function study, abnormal 04/26/2011  . Multiple rib fractures   . Obesity     Past Surgical History:  Procedure Laterality Date  . APPENDECTOMY  1975  . CARDIAC CATHETERIZATION N/A 02/17/2016   Procedure: Left  Heart Cath and Coronary Angiography;  Surgeon: Marykay Lex, MD;  Location: [facility] INVASIVE CV LAB;  Service: Cardiovascular: mRCA 85% (PCI), branch of OM2 80%,  ostD1 90%; EF 35-45%  . CARDIAC CATHETERIZATION N/A 02/17/2016   Procedure: Coronary Stent Intervention;  Surgeon: Marykay Lex, MD;  Location: [facility] INVASIVE CV LAB;  Service: Cardiovascular: mRCA PCI : STENT PROMUS PREM MR 3.5X28 DES  . CATARACT EXTRACTION  2010   b/l  . RETINAL LASER  PROCEDURE  1985  . TONSILLECTOMY AND ADENOIDECTOMY  1947  . TRANSTHORACIC ECHOCARDIOGRAM  02/2016   Mild LVH. Moderately reduced EF of 35-40% with diffuse hypokinesis. Indeterminate diastolic function. Likely elevated LA pressures. Mild aortic stenosis (mean gradient 10 mmHg). Mild MR. Mild LA dilation. Mild to moderate PA hypertension with 39 mmHg  . VIDEO ASSISTED THORACOSCOPY (VATS)/THOROCOTOMY Right 01/24/2015   Procedure: VIDEO ASSISTED THORACOSCOPY (VATS) FOR DRAINAGE OF RIGHT HEMOTHORAX;  Surgeon: Alleen Borne, MD;  Location: [facility] OR;  Service: Thoracic;  Laterality: Right;    Current Medications: Outpatient Medications Prior to Visit  Medication Sig Dispense Refill  . acetaminophen (TYLENOL) 500 MG tablet Take 500 mg by mouth 3 (three) times daily as needed (pain).    . ADVAIR DISKUS 100-50 MCG/DOSE AEPB INHALE 1 PUFF INTO THE LUNGS TWICE DAILY 1 each 0  . amLODipine (NORVASC) 10 MG tablet Take 1 tablet (10 mg total) by mouth daily. 90 tablet 3  . aspirin EC 81 MG EC tablet Take 1 tablet (81 mg total) by mouth daily. 30 tablet 3  . atorvastatin (LIPITOR) 80 MG tablet Take 1 tablet (80 mg total) by mouth daily. 90 tablet 3  . cholecalciferol (VITAMIN D) 1000 units tablet Take 1,000 Units by mouth daily.    . clopidogrel (PLAVIX) 75 MG tablet Take 1 tablet (75 mg total) by mouth daily with breakfast. 90 tablet 3  . fexofenadine (ALLEGRA) 30 MG tablet Take 30 mg by mouth daily.     . furosemide (LASIX) 40 MG tablet TAKE 1 TABLET IN THE MORNING AND 1/2 TABLET IN THE EVENING 45 tablet 0  . glimepiride (AMARYL) 1 MG tablet Take 1 tablet (1 mg total) by mouth 2 (two) times daily. 60 tablet 3  . lisinopril (PRINIVIL,ZESTRIL) 40 MG tablet Take 1 tablet (40 mg total) by mouth daily. 90 tablet 3  . metFORMIN (GLUCOPHAGE) 1000 MG tablet Take 1 tablet (1,000 mg total) by mouth 2 (two) times daily with a meal. 60 tablet 0  . nitroGLYCERIN (NITROSTAT) 0.4 MG SL tablet Place 1 tablet (0.4 mg total)  under the tongue every 5 (five) minutes as needed for chest pain. 25 tablet 2  . VENTOLIN HFA 108 (90 Base) MCG/ACT inhaler INHALE 2 PUFFS INTO THE LUNGS AS NEEDED 18 g 0  . carvedilol (COREG) 12.5 MG tablet TAKE 1 TABLET BY MOUTH TWICE DAILY WITH A MEAL 180 tablet 0  . furosemide (LASIX) 40 MG tablet TAKE 1 TABLET IN THE MORNING AND 1/2 TABLET IN THE EVENING 135 tablet 2  . potassium chloride SA (K-DUR,KLOR-CON) 20 MEQ tablet Take 1 tablet (20 mEq total) by mouth daily. 90 tablet 1  . spironolactone (ALDACTONE) 25 MG tablet Take 1 tablet (25 mg total) by mouth daily. 90 tablet 3   No facility-administered medications prior to visit.      Allergies:   Patient has no known allergies.   Social History   Social History  . Marital status: Married    Spouse name: N/A  .  Number of children: N/A  . Years of education: N/A   Social History Main Topics  . Smoking status: Never Smoker  . Smokeless tobacco: Never Used  . Alcohol use Yes     Comment: wine on weekends w/dinner  . Drug use: No  . Sexual activity: Yes    Partners: Female   Other Topics Concern  . None   Social History Narrative  . None     Family History:  The patient's family history is not on file.   ROS:   Please see the history of present illness.    ROS All other systems reviewed and are negative.   PHYSICAL EXAM:   VS:  BP 130/76   Pulse 88   Ht 6' (1.829 m)   Wt 272 lb 12.8 oz (123.7 kg)   BMI 37.00 kg/m    GEN: Well nourished, well developed, in no acute distress  HEENT: normal  Neck: no JVD, carotid bruits, or masses Cardiac: RRR; no murmurs, rubs, or gallops,no edema  Respiratory:  clear to auscultation bilaterally, normal work of breathing GI: soft, nontender, nondistended, + BS MS: no deformity or atrophy  Skin: warm and dry, no rash Neuro:  Alert and Oriented x 3, Strength and sensation are intact Psych: euthymic mood, full affect  Wt Readings from Last 3 Encounters:  01/09/17 272 lb 12.8  oz (123.7 kg)  06/28/16 277 lb (125.6 kg)  03/15/16 263 lb 6.4 oz (119.5 kg)      Studies/Labs Reviewed:   EKG:  EKG is not ordered today.    Recent Labs: 02/10/2016: B Natriuretic Peptide 542.5; Magnesium 2.1; TSH 1.542 02/18/2016: Hemoglobin 13.9; Platelets 167 12/25/2016: ALT 24 12/29/2016: BUN 25; Creatinine, Ser 1.24; Potassium 5.1; Sodium 135   Lipid Panel    Component Value Date/Time   CHOL 274 (H) 12/25/2016 1409   TRIG 517 (H) 12/25/2016 1409   HDL 37 (L) 12/25/2016 1409   CHOLHDL 7.4 (H) 12/25/2016 1409   VLDL NOT CALC 12/25/2016 1409   LDLCALC NOT CALC 12/25/2016 1409   LDLDIRECT 133 (H) 12/25/2016 1409    Additional studies/ records that were reviewed today include:   Echo 02/12/2016 LV EF: 35% -   40%  Study Conclusions  - Left ventricle: The cavity size was normal. Wall thickness was   increased in a pattern of mild LVH. Systolic function was   moderately reduced. The estimated ejection fraction was in the   range of 35% to 40%. Diffuse hypokinesis. Diastolic function is   abnormal, indeterminate grade. There is evidence of elevated LA   pressure. - Aortic valve: Mildly calcified annulus. Mildly thickened   leaflets. There was mild stenosis. Mean gradient (S): 10 mm Hg.   Valve area (VTI): 1.51 cm^2. - Mitral valve: Mildly calcified annulus. Mildly thickened leaflets   . There was mild regurgitation. - Left atrium: The atrium was mildly dilated. - Atrial septum: No defect or patent foramen ovale was identified. - Pulmonary arteries: Systolic pressure was mildly to moderately   increased. PA peak pressure: 39 mm Hg (S). - Technically difficult study. Echocontrast was used to enhance   visualization.    Cath 02/17/2016 Conclusion     Severe 3 vessel disease: mRCA, OM2 branch, D1- but only 1 PCI target  There is moderate left ventricular systolic dysfunction. The left ventricular ejection fraction is 35-45% by visual estimate.  LV end diastolic  pressure is mildly elevated - 19 mmHg  Ost 1st Diag to 1st Diag lesion,  90 %stenosed. Ostial Lat 2nd Mrg lesion, 80 %stenosed. - Medical management  Prox LAD to Mid LAD lesion, 30 %stenosed. Dist LAD-1 lesion, 35 %stenosed. Dist LAD-2 lesion, 40 %stenosed.  Mid RCA lesion, 85 %stenosed.  Post intervention, there is a 0% residual stenosis.  A STENT PROMUS PREM MR 3.5X28 drug eluting stent was successfully placed.   Although he had three-vessel disease, the only PCI amenable target is the 10 MRCA lesions. These were covered with 1 DES stent.  Otherwise he will be treated medically  Plan:  Based on his reduced ejection fraction mildly elevated LVEDP, I felt it best to monitor overnight early a.m. Discharge.  He will remain on aspirin plus Plavix for minimum one year.  Monitor blood pressure overnight, and consider potentially titrating up beta blocker prior to discharge based on heart rate  He was on Livalo as an outpatient, however probably call was ordered per formulary. Please convert back to Livalo for discharge   Expected discharge early tomorrow morning     Echo 07/17/2016 LV EF: 55% -   60%  Study Conclusions  - Left ventricle: The cavity size was normal. There was mild   concentric hypertrophy. Systolic function was normal. The   estimated ejection fraction was in the range of 55% to 60%. Wall   motion was normal; there were no regional wall motion   abnormalities. Doppler parameters are consistent with abnormal   left ventricular relaxation (grade 1 diastolic dysfunction).   Doppler parameters are consistent with indeterminate ventricular   filling pressure. - Aortic valve: Transvalvular velocity was within the normal range.   There was no stenosis. There was no regurgitation. - Mitral valve: Transvalvular velocity was within the normal range.   There was no evidence for stenosis. There was trivial   regurgitation. - Right ventricle: The cavity size was  normal. Wall thickness was   normal. Systolic function was normal. - Right atrium: The atrium was mildly dilated. - Atrial septum: No defect or patent foramen ovale was identified   by color flow Doppler. - Tricuspid valve: There was no regurgitation.  Impressions:  - Compared with echo 01/2016, systolic function has improved. No   aortic stenosis noted.   ASSESSMENT:    1. Coronary artery disease involving native coronary artery of native heart without angina pectoris   2. Medication management   3. Hyperlipidemia with target LDL less than 70   4. Essential hypertension   5. Controlled type 2 diabetes mellitus without complication, without long-term current use of insulin (HCC)      PLAN:  In order of problems listed above:  1. CAD: He denies any recent chest discomfort or shortness of breath. He did have LV dysfunction in 2017, however after being placed on appropriate heart failure medication, her EF completely normalized. Recently however he had acute kidney injury, spironolactone, potassium supplement, lisinopril and metformin were all held for one week before lab was repeated. Renal function improved afterward. Instead discontinuing spironolactone and potassium supplement, he has restarted on all medications. I will discontinue both spironolactone and potassium supplement today. He will need a repeat basic metabolic panel in about 2 weeks.  2. Hypertension: Given the discontinuation of spironolactone, I will increase his carvedilol to 25 mg twice a day.  3. Hyperlipidemia: Cholesterol poorly controlled, recent lipid panel obtained on 12/25/2016 showed cholesterol 274, HDL 37, triglyceride 517, LDL unable to be calculated. Direct LDL 133. He says he have this read the label and thinking his Plavix is  the Lipitor, therefore he may not have taken the Lipitor for six-month. He will restart again and recheck the lab work in 2 months. If lipid panel still shows uncontrolled cholesterol,  I will refer him to our lipid clinic for consideration of PCSK 9 inhibitor.  4. DM 2: Managed by primary care provider. Hemoglobin A1c recently was 8.8, uncontrolled    Medication Adjustments/Labs and Tests Ordered: Current medicines are reviewed at length with the patient today.  Concerns regarding medicines are outlined above.  Medication changes, Labs and Tests ordered today are listed in the Patient Instructions below. Patient Instructions  Medication Instructions:  STOP- Potassium and Spironolactone INCREASE- Carvedilol 25 mg twice a day   If you need a refill on your cardiac medications before your next appointment, please call your pharmacy.  Labwork: BMP in 2-3 weeks and fasting Lipid Liver in 6-8 weeks HERE IN OUR OFFICE AT [facility]  Testing/Procedures: None Ordered  Follow-Up: Your physician wants you to follow-up in: December with Dr Herbie Baltimore.    Thank you for choosing [facility] at [employer], [place]  01/10/2017 8:32 AM    [facility] [address] [place], [place], [place]  [address] Phone: [phone]; Fax: [phone]

## 2017-01-10 ENCOUNTER — Encounter: Payer: Self-pay | Admitting: Physician Assistant

## 2017-01-15 ENCOUNTER — Encounter: Payer: Self-pay | Admitting: Physician Assistant

## 2017-02-02 DIAGNOSIS — Z79899 Other long term (current) drug therapy: Secondary | ICD-10-CM | POA: Diagnosis not present

## 2017-02-02 LAB — BASIC METABOLIC PANEL
BUN / CREAT RATIO: 20 (ref 10–24)
BUN: 25 mg/dL (ref 8–27)
CO2: 25 mmol/L (ref 20–29)
CREATININE: 1.28 mg/dL — AB (ref 0.76–1.27)
Calcium: 9.2 mg/dL (ref 8.6–10.2)
Chloride: 101 mmol/L (ref 96–106)
GFR calc Af Amer: 63 mL/min/{1.73_m2} (ref >59)
GFR, EST NON AFRICAN AMERICAN: 54 mL/min/{1.73_m2} — AB (ref >59)
GLUCOSE: 170 mg/dL — AB (ref 65–99)
Potassium: 4.2 mmol/L (ref 3.5–5.2)
Sodium: 141 mmol/L (ref 134–144)

## 2017-04-02 DIAGNOSIS — E785 Hyperlipidemia, unspecified: Secondary | ICD-10-CM | POA: Diagnosis not present

## 2017-04-02 DIAGNOSIS — Z79899 Other long term (current) drug therapy: Secondary | ICD-10-CM | POA: Diagnosis not present

## 2017-04-02 LAB — LIPID PANEL
CHOL/HDL RATIO: 4.2 ratio (ref 0.0–5.0)
CHOLESTEROL TOTAL: 155 mg/dL (ref 100–199)
HDL: 37 mg/dL — AB (ref >39)
LDL CALC: 84 mg/dL (ref 0–99)
TRIGLYCERIDES: 168 mg/dL — AB (ref 0–149)
VLDL Cholesterol Cal: 34 mg/dL (ref 5–40)

## 2017-04-02 LAB — HEPATIC FUNCTION PANEL
ALT: 27 IU/L (ref 0–44)
AST: 21 IU/L (ref 0–40)
Albumin: 4.2 g/dL (ref 3.5–4.8)
Alkaline Phosphatase: 75 IU/L (ref 39–117)
BILIRUBIN, DIRECT: 0.16 mg/dL (ref 0.00–0.40)
Bilirubin Total: 0.4 mg/dL (ref 0.0–1.2)
Total Protein: 6.6 g/dL (ref 6.0–8.5)

## 2017-04-25 ENCOUNTER — Ambulatory Visit: Payer: Medicare Other | Admitting: Cardiology

## 2017-05-24 ENCOUNTER — Ambulatory Visit: Payer: Medicare Other | Admitting: Cardiology

## 2017-06-28 ENCOUNTER — Ambulatory Visit: Payer: Medicare Other | Admitting: Cardiology

## 2017-07-27 ENCOUNTER — Ambulatory Visit: Payer: Medicare Other | Admitting: Cardiology

## 2018-03-25 ENCOUNTER — Other Ambulatory Visit: Payer: Self-pay

## 2018-03-25 ENCOUNTER — Encounter (HOSPITAL_BASED_OUTPATIENT_CLINIC_OR_DEPARTMENT_OTHER): Payer: Self-pay

## 2018-03-25 ENCOUNTER — Emergency Department (HOSPITAL_BASED_OUTPATIENT_CLINIC_OR_DEPARTMENT_OTHER): Payer: Medicare Other

## 2018-03-25 ENCOUNTER — Inpatient Hospital Stay (HOSPITAL_BASED_OUTPATIENT_CLINIC_OR_DEPARTMENT_OTHER)
Admission: EM | Admit: 2018-03-25 | Discharge: 2018-04-04 | DRG: 246 | Disposition: A | Discharge status: Home / Self Care | Payer: Medicare Other | Attending: Cardiology | Admitting: Cardiology

## 2018-03-25 DIAGNOSIS — Z6838 Body mass index (BMI) 38.0-38.9, adult: Secondary | ICD-10-CM

## 2018-03-25 DIAGNOSIS — E872 Acidosis, unspecified: Secondary | ICD-10-CM

## 2018-03-25 DIAGNOSIS — I4891 Unspecified atrial fibrillation: Secondary | ICD-10-CM | POA: Diagnosis present

## 2018-03-25 DIAGNOSIS — Z79899 Other long term (current) drug therapy: Secondary | ICD-10-CM

## 2018-03-25 DIAGNOSIS — Z23 Encounter for immunization: Secondary | ICD-10-CM | POA: Diagnosis not present

## 2018-03-25 DIAGNOSIS — N183 Chronic kidney disease, stage 3 (moderate): Secondary | ICD-10-CM | POA: Diagnosis present

## 2018-03-25 DIAGNOSIS — N179 Acute kidney failure, unspecified: Secondary | ICD-10-CM | POA: Diagnosis present

## 2018-03-25 DIAGNOSIS — I5023 Acute on chronic systolic (congestive) heart failure: Secondary | ICD-10-CM | POA: Diagnosis not present

## 2018-03-25 DIAGNOSIS — J4520 Mild intermittent asthma, uncomplicated: Secondary | ICD-10-CM | POA: Diagnosis present

## 2018-03-25 DIAGNOSIS — E1165 Type 2 diabetes mellitus with hyperglycemia: Secondary | ICD-10-CM | POA: Diagnosis not present

## 2018-03-25 DIAGNOSIS — Z955 Presence of coronary angioplasty implant and graft: Secondary | ICD-10-CM | POA: Diagnosis not present

## 2018-03-25 DIAGNOSIS — E669 Obesity, unspecified: Secondary | ICD-10-CM | POA: Diagnosis present

## 2018-03-25 DIAGNOSIS — I350 Nonrheumatic aortic (valve) stenosis: Secondary | ICD-10-CM | POA: Diagnosis present

## 2018-03-25 DIAGNOSIS — I255 Ischemic cardiomyopathy: Secondary | ICD-10-CM | POA: Diagnosis not present

## 2018-03-25 DIAGNOSIS — Z7984 Long term (current) use of oral hypoglycemic drugs: Secondary | ICD-10-CM

## 2018-03-25 DIAGNOSIS — R140 Abdominal distension (gaseous): Secondary | ICD-10-CM | POA: Diagnosis present

## 2018-03-25 DIAGNOSIS — Z9841 Cataract extraction status, right eye: Secondary | ICD-10-CM | POA: Diagnosis not present

## 2018-03-25 DIAGNOSIS — I959 Hypotension, unspecified: Secondary | ICD-10-CM | POA: Diagnosis not present

## 2018-03-25 DIAGNOSIS — E1122 Type 2 diabetes mellitus with diabetic chronic kidney disease: Secondary | ICD-10-CM | POA: Diagnosis not present

## 2018-03-25 DIAGNOSIS — J302 Other seasonal allergic rhinitis: Secondary | ICD-10-CM | POA: Diagnosis present

## 2018-03-25 DIAGNOSIS — E876 Hypokalemia: Secondary | ICD-10-CM | POA: Diagnosis not present

## 2018-03-25 DIAGNOSIS — E785 Hyperlipidemia, unspecified: Secondary | ICD-10-CM | POA: Diagnosis present

## 2018-03-25 DIAGNOSIS — Z9114 Patient's other noncompliance with medication regimen: Secondary | ICD-10-CM

## 2018-03-25 DIAGNOSIS — Z7982 Long term (current) use of aspirin: Secondary | ICD-10-CM | POA: Diagnosis not present

## 2018-03-25 DIAGNOSIS — Z452 Encounter for adjustment and management of vascular access device: Secondary | ICD-10-CM

## 2018-03-25 DIAGNOSIS — I5043 Acute on chronic combined systolic (congestive) and diastolic (congestive) heart failure: Secondary | ICD-10-CM

## 2018-03-25 DIAGNOSIS — I25119 Atherosclerotic heart disease of native coronary artery with unspecified angina pectoris: Secondary | ICD-10-CM | POA: Diagnosis present

## 2018-03-25 DIAGNOSIS — R05 Cough: Secondary | ICD-10-CM | POA: Diagnosis not present

## 2018-03-25 DIAGNOSIS — R0602 Shortness of breath: Secondary | ICD-10-CM | POA: Diagnosis not present

## 2018-03-25 DIAGNOSIS — I2510 Atherosclerotic heart disease of native coronary artery without angina pectoris: Secondary | ICD-10-CM | POA: Diagnosis present

## 2018-03-25 DIAGNOSIS — I248 Other forms of acute ischemic heart disease: Secondary | ICD-10-CM | POA: Diagnosis present

## 2018-03-25 DIAGNOSIS — I130 Hypertensive heart and chronic kidney disease with heart failure and stage 1 through stage 4 chronic kidney disease, or unspecified chronic kidney disease: Secondary | ICD-10-CM | POA: Diagnosis present

## 2018-03-25 DIAGNOSIS — I4819 Other persistent atrial fibrillation: Principal | ICD-10-CM | POA: Diagnosis present

## 2018-03-25 DIAGNOSIS — Z9842 Cataract extraction status, left eye: Secondary | ICD-10-CM | POA: Diagnosis not present

## 2018-03-25 DIAGNOSIS — I10 Essential (primary) hypertension: Secondary | ICD-10-CM | POA: Diagnosis present

## 2018-03-25 DIAGNOSIS — J4530 Mild persistent asthma, uncomplicated: Secondary | ICD-10-CM | POA: Diagnosis present

## 2018-03-25 HISTORY — DX: Chronic combined systolic (congestive) and diastolic (congestive) heart failure: I50.42

## 2018-03-25 HISTORY — DX: Acidosis: E87.2

## 2018-03-25 HISTORY — DX: Other persistent atrial fibrillation: I48.19

## 2018-03-25 HISTORY — DX: Unspecified atrial fibrillation: I48.91

## 2018-03-25 HISTORY — DX: Chronic kidney disease, stage 3 (moderate): N18.3

## 2018-03-25 HISTORY — DX: Chronic kidney disease, stage 3 unspecified: N18.30

## 2018-03-25 HISTORY — DX: Acidosis, unspecified: E87.20

## 2018-03-25 LAB — I-STAT CG4 LACTIC ACID, ED
LACTIC ACID, VENOUS: 2.72 mmol/L — AB (ref 0.5–1.9)
Lactic Acid, Venous: 1.69 mmol/L (ref 0.5–1.9)

## 2018-03-25 LAB — CBC WITH DIFFERENTIAL/PLATELET
ABS IMMATURE GRANULOCYTES: 0.03 10*3/uL (ref 0.00–0.07)
Basophils Absolute: 0.1 10*3/uL (ref 0.0–0.1)
Basophils Relative: 1 %
Eosinophils Absolute: 0.1 10*3/uL (ref 0.0–0.5)
Eosinophils Relative: 1 %
HEMATOCRIT: 47 % (ref 39.0–52.0)
HEMOGLOBIN: 14.9 g/dL (ref 13.0–17.0)
IMMATURE GRANULOCYTES: 0 %
LYMPHS ABS: 1.6 10*3/uL (ref 0.7–4.0)
LYMPHS PCT: 17 %
MCH: 29 pg (ref 26.0–34.0)
MCHC: 31.7 g/dL (ref 30.0–36.0)
MCV: 91.4 fL (ref 80.0–100.0)
MONO ABS: 0.8 10*3/uL (ref 0.1–1.0)
MONOS PCT: 9 %
NEUTROS ABS: 6.8 10*3/uL (ref 1.7–7.7)
NEUTROS PCT: 72 %
PLATELETS: 254 10*3/uL (ref 150–400)
RBC: 5.14 MIL/uL (ref 4.22–5.81)
RDW: 13.1 % (ref 11.5–15.5)
WBC: 9.3 10*3/uL (ref 4.0–10.5)
nRBC: 0 % (ref 0.0–0.2)

## 2018-03-25 LAB — RAPID URINE DRUG SCREEN, HOSP PERFORMED
Amphetamines: NOT DETECTED
BARBITURATES: NOT DETECTED
Benzodiazepines: NOT DETECTED
Cocaine: NOT DETECTED
OPIATES: NOT DETECTED
TETRAHYDROCANNABINOL: NOT DETECTED

## 2018-03-25 LAB — URINALYSIS, ROUTINE W REFLEX MICROSCOPIC
Bilirubin Urine: NEGATIVE
GLUCOSE, UA: 100 mg/dL — AB
KETONES UR: NEGATIVE mg/dL
LEUKOCYTES UA: NEGATIVE
Nitrite: NEGATIVE
PROTEIN: 100 mg/dL — AB
Specific Gravity, Urine: >1.03 — ABNORMAL HIGH (ref 1.005–1.030)
pH: 5.5 (ref 5.0–8.0)

## 2018-03-25 LAB — COMPREHENSIVE METABOLIC PANEL
ALBUMIN: 4.1 g/dL (ref 3.5–5.0)
ALT: 25 U/L (ref 0–44)
ANION GAP: 13 (ref 5–15)
AST: 27 U/L (ref 15–41)
Alkaline Phosphatase: 70 U/L (ref 38–126)
BILIRUBIN TOTAL: 2 mg/dL — AB (ref 0.3–1.2)
BUN: 25 mg/dL — AB (ref 8–23)
CHLORIDE: 101 mmol/L (ref 98–111)
CO2: 23 mmol/L (ref 22–32)
Calcium: 9.2 mg/dL (ref 8.9–10.3)
Creatinine, Ser: 1.51 mg/dL — ABNORMAL HIGH (ref 0.61–1.24)
GFR calc Af Amer: 50 mL/min — ABNORMAL LOW (ref >60)
GFR calc non Af Amer: 43 mL/min — ABNORMAL LOW (ref >60)
GLUCOSE: 262 mg/dL — AB (ref 70–99)
POTASSIUM: 3.7 mmol/L (ref 3.5–5.1)
SODIUM: 137 mmol/L (ref 135–145)
TOTAL PROTEIN: 7.6 g/dL (ref 6.5–8.1)

## 2018-03-25 LAB — MAGNESIUM: MAGNESIUM: 2 mg/dL (ref 1.7–2.4)

## 2018-03-25 LAB — HEPARIN LEVEL (UNFRACTIONATED): HEPARIN UNFRACTIONATED: 1.24 [IU]/mL — AB (ref 0.30–0.70)

## 2018-03-25 LAB — APTT: aPTT: 39 seconds — ABNORMAL HIGH (ref 24–36)

## 2018-03-25 LAB — URINALYSIS, MICROSCOPIC (REFLEX)

## 2018-03-25 LAB — TROPONIN I
Troponin I: 0.05 ng/mL (ref <0.03)
Troponin I: 0.04 ng/mL (ref <0.03)

## 2018-03-25 LAB — TSH: TSH: 1.685 u[IU]/mL (ref 0.350–4.500)

## 2018-03-25 LAB — GLUCOSE, CAPILLARY
GLUCOSE-CAPILLARY: 240 mg/dL — AB (ref 70–99)
Glucose-Capillary: 233 mg/dL — ABNORMAL HIGH (ref 70–99)

## 2018-03-25 LAB — BRAIN NATRIURETIC PEPTIDE: B Natriuretic Peptide: 381.9 pg/mL — ABNORMAL HIGH (ref 0.0–100.0)

## 2018-03-25 MED ORDER — FUROSEMIDE 10 MG/ML IJ SOLN
INTRAMUSCULAR | Status: AC
  Administered 2018-03-25: 40 mg via INTRAVENOUS
  Filled 2018-03-25: qty 4

## 2018-03-25 MED ORDER — DILTIAZEM LOAD VIA INFUSION
20.0000 mg | Freq: Once | INTRAVENOUS | Status: AC
  Administered 2018-03-25: 20 mg via INTRAVENOUS
  Filled 2018-03-25: qty 20

## 2018-03-25 MED ORDER — MOMETASONE FURO-FORMOTEROL FUM 100-5 MCG/ACT IN AERO
2.0000 | INHALATION_SPRAY | Freq: Two times a day (BID) | RESPIRATORY_TRACT | Status: DC
  Administered 2018-03-26 – 2018-04-04 (×17): 2 via RESPIRATORY_TRACT
  Filled 2018-03-25 (×3): qty 8.8

## 2018-03-25 MED ORDER — ATORVASTATIN CALCIUM 80 MG PO TABS
80.0000 mg | ORAL_TABLET | Freq: Every day | ORAL | Status: DC
  Administered 2018-03-26 – 2018-04-03 (×9): 80 mg via ORAL
  Filled 2018-03-25 (×9): qty 1

## 2018-03-25 MED ORDER — NITROGLYCERIN 0.4 MG SL SUBL: 0.4000 mg | SUBLINGUAL_TABLET | SUBLINGUAL | Status: DC | PRN

## 2018-03-25 MED ORDER — HEPARIN (PORCINE) 25000 UT/250ML-% IV SOLN: 1500.0000 [IU]/h | INTRAVENOUS | Status: DC

## 2018-03-25 MED ORDER — SODIUM CHLORIDE 0.9% FLUSH
3.0000 mL | Freq: Two times a day (BID) | INTRAVENOUS | Status: DC
  Administered 2018-03-25 – 2018-04-02 (×7): 3 mL via INTRAVENOUS

## 2018-03-25 MED ORDER — INFLUENZA VAC SPLIT HIGH-DOSE 0.5 ML IM SUSY
0.5000 mL | PREFILLED_SYRINGE | INTRAMUSCULAR | Status: AC
  Administered 2018-03-29: 0.5 mL via INTRAMUSCULAR
  Filled 2018-03-25: qty 0.5

## 2018-03-25 MED ORDER — METOPROLOL TARTRATE 25 MG PO TABS
25.0000 mg | ORAL_TABLET | Freq: Four times a day (QID) | ORAL | Status: DC
  Administered 2018-03-25 – 2018-03-26 (×3): 25 mg via ORAL
  Filled 2018-03-25 (×3): qty 1

## 2018-03-25 MED ORDER — SODIUM CHLORIDE 0.9 % IV SOLN
250.0000 mL | INTRAVENOUS | Status: DC | PRN
  Administered 2018-03-29: 13:00:00 via INTRAVENOUS

## 2018-03-25 MED ORDER — ONDANSETRON HCL 4 MG/2ML IJ SOLN: 4.0000 mg | Freq: Four times a day (QID) | INTRAMUSCULAR | Status: DC | PRN

## 2018-03-25 MED ORDER — FUROSEMIDE 10 MG/ML IJ SOLN
40.0000 mg | INTRAMUSCULAR | Status: AC
  Administered 2018-03-25: 40 mg via INTRAVENOUS
  Filled 2018-03-25: qty 4

## 2018-03-25 MED ORDER — PNEUMOCOCCAL VAC POLYVALENT 25 MCG/0.5ML IJ INJ
0.5000 mL | INJECTION | INTRAMUSCULAR | Status: AC
  Administered 2018-03-29: 0.5 mL via INTRAMUSCULAR
  Filled 2018-03-25: qty 0.5

## 2018-03-25 MED ORDER — DILTIAZEM HCL 100 MG IV SOLR
INTRAVENOUS | Status: AC
  Administered 2018-03-25: 100 mg
  Filled 2018-03-25: qty 100

## 2018-03-25 MED ORDER — APIXABAN 5 MG PO TABS
5.0000 mg | ORAL_TABLET | Freq: Two times a day (BID) | ORAL | Status: DC
  Administered 2018-03-25: 5 mg via ORAL
  Filled 2018-03-25: qty 2

## 2018-03-25 MED ORDER — ACETAMINOPHEN 500 MG PO TABS
500.0000 mg | ORAL_TABLET | Freq: Three times a day (TID) | ORAL | Status: DC | PRN
  Administered 2018-03-29 (×2): 500 mg via ORAL
  Filled 2018-03-25 (×2): qty 1

## 2018-03-25 MED ORDER — SODIUM CHLORIDE 0.9% FLUSH: 3.0000 mL | INTRAVENOUS | Status: DC | PRN

## 2018-03-25 MED ORDER — INSULIN ASPART 100 UNIT/ML ~~LOC~~ SOLN
0.0000 [IU] | Freq: Three times a day (TID) | SUBCUTANEOUS | Status: DC
  Administered 2018-03-26 (×2): 2 [IU] via SUBCUTANEOUS
  Administered 2018-03-26 – 2018-03-27 (×2): 3 [IU] via SUBCUTANEOUS
  Administered 2018-03-27: 1 [IU] via SUBCUTANEOUS
  Administered 2018-03-27 – 2018-03-28 (×2): 2 [IU] via SUBCUTANEOUS
  Administered 2018-03-28 – 2018-03-29 (×2): 1 [IU] via SUBCUTANEOUS
  Administered 2018-03-30 (×2): 2 [IU] via SUBCUTANEOUS
  Administered 2018-03-31: 1 [IU] via SUBCUTANEOUS
  Administered 2018-03-31: 2 [IU] via SUBCUTANEOUS
  Administered 2018-03-31: 1 [IU] via SUBCUTANEOUS
  Administered 2018-04-01 – 2018-04-02 (×2): 3 [IU] via SUBCUTANEOUS
  Administered 2018-04-03: 2 [IU] via SUBCUTANEOUS
  Administered 2018-04-04: 1 [IU] via SUBCUTANEOUS

## 2018-03-25 MED ORDER — FUROSEMIDE 10 MG/ML IJ SOLN
40.0000 mg | Freq: Once | INTRAMUSCULAR | Status: AC
  Administered 2018-03-25: 40 mg via INTRAVENOUS
  Filled 2018-03-25: qty 4

## 2018-03-25 MED ORDER — DILTIAZEM HCL 100 MG IV SOLR
5.0000 mg/h | INTRAVENOUS | Status: DC
  Administered 2018-03-25: 5 mg/h via INTRAVENOUS
  Filled 2018-03-25: qty 100

## 2018-03-25 MED ORDER — LORATADINE 10 MG PO TABS
10.0000 mg | ORAL_TABLET | Freq: Every day | ORAL | Status: DC
  Administered 2018-03-25 – 2018-04-04 (×11): 10 mg via ORAL
  Filled 2018-03-25 (×11): qty 1

## 2018-03-25 MED ORDER — ASPIRIN EC 81 MG PO TBEC
81.0000 mg | DELAYED_RELEASE_TABLET | Freq: Every day | ORAL | Status: DC
  Administered 2018-03-25 – 2018-03-27 (×3): 81 mg via ORAL
  Filled 2018-03-25 (×3): qty 1

## 2018-03-25 MED ORDER — HEPARIN (PORCINE) 25000 UT/250ML-% IV SOLN
1400.0000 [IU]/h | INTRAVENOUS | Status: DC
  Administered 2018-03-25 – 2018-03-26 (×3): 1500 [IU]/h via INTRAVENOUS
  Filled 2018-03-25 (×2): qty 250

## 2018-03-25 MED ORDER — LEVALBUTEROL HCL 0.63 MG/3ML IN NEBU
0.6300 mg | INHALATION_SOLUTION | Freq: Three times a day (TID) | RESPIRATORY_TRACT | Status: DC | PRN
  Administered 2018-03-29 – 2018-03-31 (×2): 0.63 mg via RESPIRATORY_TRACT
  Filled 2018-03-25 (×2): qty 3

## 2018-03-25 MED ORDER — HEPARIN BOLUS VIA INFUSION: 5000.0000 [IU] | Freq: Once | INTRAVENOUS | Status: DC

## 2018-03-25 MED ORDER — DILTIAZEM HCL-DEXTROSE 100-5 MG/100ML-% IV SOLN (PREMIX)
5.0000 mg/h | INTRAVENOUS | Status: DC
  Administered 2018-03-25: 5 mg/h via INTRAVENOUS
  Filled 2018-03-25: qty 100

## 2018-03-25 MED ORDER — GLIMEPIRIDE 1 MG PO TABS
1.0000 mg | ORAL_TABLET | Freq: Two times a day (BID) | ORAL | Status: DC
  Administered 2018-03-25 – 2018-04-04 (×20): 1 mg via ORAL
  Filled 2018-03-25 (×21): qty 1

## 2018-03-25 MED ORDER — LEVALBUTEROL TARTRATE 45 MCG/ACT IN AERO: 2.0000 | INHALATION_SPRAY | Freq: Four times a day (QID) | RESPIRATORY_TRACT | Status: DC | PRN

## 2018-03-25 NOTE — ED Notes (Signed)
Report called to carelink. ETA 20 minutes

## 2018-03-25 NOTE — Progress Notes (Signed)
ANTICOAGULATION CONSULT NOTE - Initial Consult  Pharmacy Consult for heparin Indication: atrial fibrillation  No Known Allergies  Patient Measurements: Height: 6' (182.9 cm) Weight: 250 lb (113.4 kg) IBW/kg (Calculated) : 77.6 Heparin Dosing Weight: 102kg  Vital Signs: Temp: 97.6 F (36.4 C) (11/11 1205) Temp Source: Oral (11/11 1205) BP: 116/86 (11/11 1433) Pulse Rate: 72 (11/11 1433)  Labs: Recent Labs    03/25/18 1225  HGB 14.9  HCT 47.0  PLT 254  CREATININE 1.51*  TROPONINI 0.05*    Estimated Creatinine Clearance: 54.1 mL/min (A) (by C-G formula based on SCr of 1.51 mg/dL (H)).   Medical History: Past Medical History:  Diagnosis Date  . Allergy    seasonal  . Asthma    mild, intermittent  . CAD, multiple vessel    Three-vessel disease involving mid RCA 85% (PCI with DES), OM 2 80% in branch and ostD1 ~90%. Only the mid RCA with PCI target.   . Diabetes mellitus    type 2  . Fracture of multiple ribs 02/12/2015  . Hyperlipidemia   . Hypertension   . Hypocalcemia 02/12/2015  . Liver function study, abnormal 04/26/2011  . Multiple rib fractures   . Obesity     Medications:  Scheduled:    Assessment: 76 YOM presenting with SOB, in afib in ED, no AC PTA and CBC wnl. Goal of Therapy:  Heparin level 0.3-0.7 units/ml Monitor platelets by anticoagulation protocol: Yes   Plan:  Heparin 5000 units, and gtt at 1500 units/hr 8 hour heparin level to confirm Daily CBC, heparin level, s/s bleeding  Daylene Posey, PharmD Clinical Pharmacist Please check AMION for all [facility] Pharmacy numbers 03/25/2018 2:52 PM

## 2018-03-25 NOTE — H&P (Addendum)
Cardiology History & Physical    Patient ID: David Huynh: [id number], DOB: June 16, 1940 Date of Encounter: 03/25/2018, 7:37 PM Primary Physician: Bradd Canary, MD Primary Cardiologist: [name], MD Primary Electrophysiologist:  None  Chief Complaint: shortness of breath Reason for Admission: atrial fib RVR, possible CHF Requesting MD: Dr. Clayborne Dana  HPI: David Huynh is a 77 y.o. male with history of CAD, chronic combined CHF, HTN, HLD, DM, CKD III, asthma who was admitted with worsening SOB and atrial fib RVR. He stopped taking all of his medications about a week ago in hopes he would feel better.  He was previously admitted in 2017 with cough and lower extremity edema. Echocardiogram obtained on 02/12/2016 showed EF 35-40%, mild LVH, diffuse hypokinesis, indeterminate diastolic dysfunction, PA peak pressure 39 mmHg. He was treated for combined CHF. He was discharged on Lasix with plan for OP elective cath at a later time. He underwent outpatient cardiac catheterization on 06/20/2015 which showed severe three-vessel disease with 90% ostial D1 stenosis, 80% ostial second marginal stenosis, 40% distal LAD stenosis, 85% mid RCA stenosis - the only PCI amenable target appeared to be the RCA lesion treated with DES, EF 35-45%. The diagonal and lateral marginal lesion being managed medically. He was placed on aspirin and Plavix with plan to continue for at least one year. He has been maintained on both since that time. Repeat echo 07/2016 showed normalization of LV function with EF 55-60%, grade 1 DD; LA was normal in size.  About 2.5 weeks ago he developed worsening SOB with a cough productive of yellow phlegm for several days. He used an old albuterol inhaler with some relief. The phlegm resolved but he was left with a persistent dry cough. Over the last week he began to feel worse again with development of orthopnea. This last week, he felt so bad with poor appetite so he stopped  taking all his medications except began over-using an OTC Primatene inhaler around the clock in hopes of feeling better (inhaled epinephrine). He tried to avoid seeking medical attention but symptoms got so bad that he came to [facility] where he was found to be in rapid afib of unknown duration. He is not acutely aware of palpitations. He denies any CP. He thinks he's up about 10lb but not really sure. BP labile from 111/82 to 163/113. ER doctor spoke with Dr. Anne Fu on-call who advised 20mg  diltiazem bolus with diltiazem drip, and initiation of Eliquis. He was also given 40mg  IV Lasix. Labs reveal BNP 381, troponin 0.05, lactic acidosis 2.72 normalized to 1.69 after above measures, glucose 262, BUN 25, Cr 1.51, normal CBC. UDS negative. UA concentrated with no leuks or nitrite, blood cx pending. CXR - bilateral interstitial densities suggesting mild edema, small right pleural effusion. Last OP Cr last 12/2016 1.28.  Past Medical History:  Diagnosis Date  . Allergy    seasonal  . Asthma    mild, intermittent  . CAD, multiple vessel    Three-vessel disease involving mid RCA 85% (PCI with DES), OM 2 80% in Tammela Bales and ostD1 ~90%. Only the mid RCA with PCI target.   . Chronic combined systolic and diastolic CHF (congestive heart failure) (HCC)   . CKD (chronic kidney disease), stage III (HCC)   . Diabetes mellitus    type 2  . Fracture of multiple ribs 02/12/2015  . Hyperlipidemia   . Hypertension   . Hypocalcemia 02/12/2015  . Liver function study, abnormal 04/26/2011  .  Multiple rib fractures   . Obesity      Surgical History:  Past Surgical History:  Procedure Laterality Date  . APPENDECTOMY  1975  . CARDIAC CATHETERIZATION N/A 02/17/2016   Procedure: Left Heart Cath and Coronary Angiography;  Surgeon: Marykay Lex, MD;  Location: [facility] INVASIVE CV LAB;  Service: Cardiovascular: mRCA 85% (PCI), Jyasia Markoff of OM2 80%,  ostD1 90%; EF 35-45%  . CARDIAC CATHETERIZATION N/A 02/17/2016   Procedure:  Coronary Stent Intervention;  Surgeon: Marykay Lex, MD;  Location: [facility] INVASIVE CV LAB;  Service: Cardiovascular: mRCA PCI : STENT PROMUS PREM MR 3.5X28 DES  . CATARACT EXTRACTION  2010   b/l  . RETINAL LASER PROCEDURE  1985  . TONSILLECTOMY AND ADENOIDECTOMY  1947  . TRANSTHORACIC ECHOCARDIOGRAM  02/2016   Mild LVH. Moderately reduced EF of 35-40% with diffuse hypokinesis. Indeterminate diastolic function. Likely elevated LA pressures. Mild aortic stenosis (mean gradient 10 mmHg). Mild MR. Mild LA dilation. Mild to moderate PA hypertension with 39 mmHg  . VIDEO ASSISTED THORACOSCOPY (VATS)/THOROCOTOMY Right 01/24/2015   Procedure: VIDEO ASSISTED THORACOSCOPY (VATS) FOR DRAINAGE OF RIGHT HEMOTHORAX;  Surgeon: Alleen Borne, MD;  Location: [facility] OR;  Service: Thoracic;  Laterality: Right;     Home Meds: Prior to Admission medications   Medication Sig Start Date End Date Taking? Authorizing Provider  acetaminophen (TYLENOL) 500 MG tablet Take 500 mg by mouth 3 (three) times daily as needed (pain).    [provider]  ADVAIR DISKUS 100-50 MCG/DOSE AEPB INHALE 1 PUFF INTO THE LUNGS TWICE DAILY 07/20/16   Bradd Canary, MD  amLODipine (NORVASC) 10 MG tablet Take 1 tablet (10 mg total) by mouth daily. 06/28/16   Marykay Lex, MD  aspirin EC 81 MG EC tablet Take 1 tablet (81 mg total) by mouth daily. 02/13/16   Rai, Ripudeep Kirtland Bouchard, MD  atorvastatin (LIPITOR) 80 MG tablet Take 1 tablet (80 mg total) by mouth daily. 12/26/16 03/26/17  Marykay Lex, MD  carvedilol (COREG) 25 MG tablet Take 1 tablet (25 mg total) by mouth 2 (two) times daily with a meal. 01/09/17   Azalee Course, PA  cholecalciferol (VITAMIN D) 1000 units tablet Take 1,000 Units by mouth daily.    [provider]  clopidogrel (PLAVIX) 75 MG tablet Take 1 tablet (75 mg total) by mouth daily with breakfast. 06/28/16   Marykay Lex, MD  fexofenadine (ALLEGRA) 30 MG tablet Take 30 mg by mouth daily.     [provider]  furosemide (LASIX) 40 MG tablet TAKE 1 TABLET IN THE MORNING AND 1/2 TABLET IN THE EVENING 04/17/16   Azalee Course, PA  glimepiride (AMARYL) 1 MG tablet Take 1 tablet (1 mg total) by mouth 2 (two) times daily. 01/01/17   Bradd Canary, MD  lisinopril (PRINIVIL,ZESTRIL) 40 MG tablet Take 1 tablet (40 mg total) by mouth daily. 06/28/16   Marykay Lex, MD  metFORMIN (GLUCOPHAGE) 1000 MG tablet Take 1 tablet (1,000 mg total) by mouth 2 (two) times daily with a meal. 01/09/17   Bradd Canary, MD  nitroGLYCERIN (NITROSTAT) 0.4 MG SL tablet Place 1 tablet (0.4 mg total) under the tongue every 5 (five) minutes as needed for chest pain. 02/18/16   Little Ishikawa, NP  VENTOLIN HFA 108 [phone] Base) MCG/ACT inhaler INHALE 2 PUFFS INTO THE LUNGS AS NEEDED 11/30/16   Bradd Canary, MD    Allergies: No Known Allergies  Social History   Socioeconomic  History  . Marital status: Married    Spouse name: Not on file  . Number of children: Not on file  . Years of education: Not on file  . Highest education level: Not on file  Occupational History  . Not on file  Social Needs  . Financial resource strain: Not on file  . Food insecurity:    Worry: Not on file    Inability: Not on file  . Transportation needs:    Medical: Not on file    Non-medical: Not on file  Tobacco Use  . Smoking status: Never Smoker  . Smokeless tobacco: Never Used  Substance and Sexual Activity  . Alcohol use: Yes    Comment: rare use now  . Drug use: No  . Sexual activity: Not on file  Lifestyle  . Physical activity:    Days per week: Not on file    Minutes per session: Not on file  . Stress: Not on file  Relationships  . Social connections:    Talks on phone: Not on file    Gets together: Not on file    Attends religious service: Not on file    Active member of club or organization: Not on file    Attends meetings of clubs or organizations: Not on file    Relationship status: Not on file  . Intimate partner  violence:    Fear of current or ex partner: Not on file    Emotionally abused: Not on file    Physically abused: Not on file    Forced sexual activity: Not on file  Other Topics Concern  . Not on file  Social History Narrative  . Not on file     Family History  Problem Relation Age of Onset  . CAD Neg Hx    Review of Systems: No fevers or chills. Afebrile in ER. All other systems reviewed and are otherwise negative except as noted above.  Labs:   Lab Results  Component Value Date   WBC 9.3 03/25/2018   HGB 14.9 03/25/2018   HCT 47.0 03/25/2018   MCV 91.4 03/25/2018   PLT 254 03/25/2018    Recent Labs  Lab 03/25/18 1225  NA 137  K 3.7  CL 101  CO2 23  BUN 25*  CREATININE 1.51*  CALCIUM 9.2  PROT 7.6  BILITOT 2.0*  ALKPHOS 70  ALT 25  AST 27  GLUCOSE 262*   Recent Labs    03/25/18 1225  TROPONINI 0.05*   Lab Results  Component Value Date   CHOL 155 04/02/2017   HDL 37 (L) 04/02/2017   LDLCALC 84 04/02/2017   TRIG 168 (H) 04/02/2017   Lab Results  Component Value Date   DDIMER 1.13 (H) 02/11/2016    Radiology/Studies:  Dg Chest 2 View  Result Date: 03/25/2018 CLINICAL DATA:  Shortness of breath a tachycardia. Productive cough for 3 weeks. EXAM: CHEST - 2 VIEW COMPARISON:  Chest radiographs 02/10/2016 and CTA 02/11/2016 FINDINGS: The cardiac silhouette remains mildly enlarged. The interstitial markings are mildly increased bilaterally in a symmetric fashion, slightly greater than on the prior radiographs. A small right pleural effusion is slightly larger than on the prior radiographs. No pneumothorax is identified. Old bilateral rib fractures are noted. IMPRESSION: Bilateral interstitial densities suggesting mild edema. Small right pleural effusion. Electronically Signed   By: Sebastian Ache M.D.   On: 03/25/2018 13:16   Wt Readings from Last 3 Encounters:  03/25/18 129.4 kg  01/09/17 123.7  kg  06/28/16 125.6 kg    EKG: atrial fib RVR 169bpm with  old anterior infarct and nonspecific ST-T changes, lower voltage  Physical Exam: Blood pressure (!) 138/126, pulse (!) 107, temperature (!) 97.3 F (36.3 C), temperature source Oral, resp. rate 20, height 6' (1.829 m), weight 129.4 kg, SpO2 100 %. Body mass index is 38.69 kg/m. General: Obese WM in no acute distress. Becomes visibly SOB with movement of leaning over and taking socks off Head: Normocephalic, atraumatic, sclera non-icteric, no xanthomas, nares are without discharge.  Neck: Negative for carotid bruits. JVD mod elevated. Lungs: Decreased BS throughout but most notably at bases. No wheezing, rales, or rhonchi. Breathing is unlabored. Heart: Rapid, irregular with S1 S2. No murmurs, rubs, or gallops appreciated. Abdomen: Round with slight distention with normoactive bowel sounds. No hepatomegaly. No rebound/guarding. No obvious abdominal masses. Msk:  Strength and tone appear normal for age. Extremities: No clubbing or cyanosis. 1+ stiff BLE edema.  Distal pedal pulses are 2+ and equal bilaterally. Neuro: Alert and oriented X 3. No focal deficit. No facial asymmetry. Moves all extremities spontaneously. Psych:  Responds to questions appropriately with a normal affect.    Assessment and Plan   1. New onset atrial fib RVR - unclear duration given symptoms for several weeks. Not clear what precipitated what here - AF leading to CHF or vice versa. Certainly not helped by patient's use of OTC inhaled epinephrine or lack of medication over the last week. Discussed dx with patient. Given his degree of dyspnea/orthopnea there is concern that he may have dropped his EF again. D/w Dr. Wyline Mood. Instead of going right to Eliquis would prefer to use heparin per pharmacy while we cycle trops and await echocardiogram. Will continue diltiazem, but add back beta blocker in the form of metoprolol with plan to wean dilt off. Check TSH and mag. CHADSVASC is 5. If rates remain challenging or EF has dropped,  may need to consider earlier restoration of NSR during admission.  2. Acute on chronic combined CHF - will initiate Lasix 40mg  IV BID. Hold ACEI for now given AKI. Follow I/O's, daily weights. Will need repetition of HF education when less acute.  3. CAD with mildly elevated troponin - no chest pain. Last cath 2017 with multivessel disease. Hold Plavix given plan for initiation of NOAC this admission (and dose given earlier today) but continue ASA. If recurrent LV dysfunction is noted or troponin rises, may need to consider repeat cath this admission. Resume statin.  4. Lactic acidosis - suspect demand-related. Normalized on recheck.  4. AKI on CKD stage III - suspect driven by cardiorenal/perfusion from above medical issues. Hold off on ACEI/spiro but follow with diuresis. Consider renal US if remains elevated above baseline.  5. Hyperglycemia/diabetes mellitus - continue glimepiride but hold metformin given lactic acidosis. Add SSI.  6. HTN - follow BP with above measures.  7. Asthma - continue Advair. Add Xopenex inhalers in place of albuterol. No infiltrate on CXR. If SOB does not improve with above measures can consider titration of pulm regimen but as of right now, no active wheezing noted.   Severity of Illness: The appropriate patient status for this patient is OBSERVATION. Observation status is judged to be reasonable and necessary in order to provide the required intensity of service to ensure the patient's safety. The patient's presenting symptoms, physical exam findings, and initial radiographic and laboratory data in the context of their medical condition is felt to place them at decreased risk  for further clinical deterioration. Furthermore, it is anticipated that the patient will be medically stable for discharge from the hospital within 2 midnights of admission. The following factors support the patient status of observation.   " The patient's presenting symptoms include SOB,  swelling " The physical exam findings include edema, tachycardia. " The initial radiographic and laboratory data are noted above. Elevated lactate, BNP, Cr, BUN, troponin.     For questions or updates, please contact [facility] Please consult [email or web address] for contact info under Cardiology/STEMI.  Signed, Laurann Montana, PA-C 03/25/2018, 7:37 PM  Attending note  Patient seen and discussed with PA Dunn, I agree with her documentation above.   77 yo male history of HTN, HL, DM2, asthma, chronic systolic HF with LVEF now normalized, CAD s/p 02/2016 DES to RCA and medically managed OM2 and D1 disease, DM2, HL presented to [facility] with SOB  In ER found to be in afib with RVR, started on cardizem gtt and eliquis 5mg  bid. Some evidence of volume overload, given IV lasix 40mg  x 1 in ER.    K 3.7 Cr 1.51 WBC 9.3 Hgb 14.9 Plt 254 BNP 381 Lactic acid 2.7 -->1.7 Trop 0.05--> CXR bilateral interstitial densities suggesting mild edema, mild right effusion   New onset afib. He received IV dilt x20, he is on dilt gtt and has been started on eliquis. Has been on oral coreg at home, would start oral lopressor and wean dilt gtt as tolerated. Once rates controlled repeat echo, if recurrent drop in LVEF would consolidate to Toprol. HOld other home bp meds while titrating av nodal agents.  Continue eliquis for CHADS2Vasc score of (CHF, HTN, Age x2, DM, CAD) of 6. Mild trop in setting of afib with RVR and CHF, nonspecific findings, has had mild trops on previous admissions, follow trend for now. Some fluid overload likely exacerbated by afib with RVR, he received IV lasix in 40mg  ER this afternoon, redose lasix 40mg  IV tonight and reassess in AM. Add TSH and Mg to AM labs.  Dina Rich MD

## 2018-03-25 NOTE — Progress Notes (Signed)
ANTICOAGULATION CONSULT NOTE   Pharmacy Consult for heparin Indication: atrial fibrillation  No Known Allergies  Patient Measurements: Height: 6' (182.9 cm) Weight: 285 lb 4.8 oz (129.4 kg) IBW/kg (Calculated) : 77.6 Heparin Dosing Weight: 102kg  Vital Signs: Temp: 97.3 F (36.3 C) (11/11 1927) Temp Source: Oral (11/11 1927) BP: 138/126 (11/11 1927) Pulse Rate: 107 (11/11 1927)  Labs: Recent Labs    03/25/18 1225  HGB 14.9  HCT 47.0  PLT 254  CREATININE 1.51*  TROPONINI 0.05*    Estimated Creatinine Clearance: 57.9 mL/min (A) (by C-G formula based on SCr of 1.51 mg/dL (H)).   Medical History: Past Medical History:  Diagnosis Date  . Allergy    seasonal  . Asthma    mild, intermittent  . CAD, multiple vessel    Three-vessel disease involving mid RCA 85% (PCI with DES), OM 2 80% in branch and ostD1 ~90%. Only the mid RCA with PCI target.   . Chronic combined systolic and diastolic CHF (congestive heart failure) (HCC)   . CKD (chronic kidney disease), stage III (HCC)   . Diabetes mellitus    type 2  . Fracture of multiple ribs 02/12/2015  . Hyperlipidemia   . Hypertension   . Hypocalcemia 02/12/2015  . Liver function study, abnormal 04/26/2011  . Multiple rib fractures   . Obesity     Medications:  Scheduled:  . aspirin EC  81 mg Oral Daily  . [START ON 03/26/2018] atorvastatin  80 mg Oral q1800  . glimepiride  1 mg Oral BID  . [START ON 03/26/2018] insulin aspart  0-9 Units Subcutaneous TID WC  . loratadine  10 mg Oral Daily  . metoprolol tartrate  25 mg Oral Q6H  . mometasone-formoterol  2 puff Inhalation BID  . [START ON 03/26/2018] pneumococcal 23 valent vaccine  0.5 mL Intramuscular Tomorrow-1000  . sodium chloride flush  3 mL Intravenous Q12H    Assessment: 77 YOM presenting with SOB, in afib in [facility], no AC PTA. Consult placed to start heparin infusion; however, plan changed to receive apixaban (last dose on 11/11@1526 ).   Transfer to [facility] - plan  changed to using heparin infusion to allow cycling of troponin and awaiting ECHO results to determine cardiac plans. Hgb 14.9, plt 254. No s/sx of bleeding. Troponin 0.05.   Given recent apixaban dose will monitor both aPTT and heparin level until correlate.   Goal of Therapy:  Heparin level 0.3-0.7 units/ml APTT: 66-102 sec Monitor platelets by anticoagulation protocol: Yes   Plan:  Start heparin infusion at 1500 units/hr on 11/12@0330  8 hour heparin level/aPTT to confirm Daily CBC, heparin level/aPTT, s/s bleeding  Girard Cooter, PharmD Clinical Pharmacist  Pager: [phone] Phone: [phone] Please check AMION for all [facility] numbers 03/25/2018 8:58 PM

## 2018-03-25 NOTE — ED Notes (Signed)
Critical Lacitc Acid 2.72 reported to [place], [employer]

## 2018-03-25 NOTE — ED Notes (Addendum)
Patient transported to x-ray. ?

## 2018-03-25 NOTE — ED Triage Notes (Signed)
C/o SOB, prod cough x 3 weeks-NAD-to triage in w/c

## 2018-03-25 NOTE — ED Provider Notes (Signed)
[facility] EMERGENCY DEPARTMENT Provider Note   CSN: [id number] Arrival date & time: 03/25/18  1158     History   Chief Complaint Chief Complaint  Patient presents with  . Shortness of Breath    HPI ENOS MUHL is a 77 y.o. male.  HPI   CADEL STAIRS is a 77 y.o. male, with a history of CAD, DM, HTN, hyperlipidemia, presenting to the ED with shortness of breath for approximately the last 3 weeks.  States he had a week of productive cough and fatigue without fever or other symptoms. Shortness of breath continued and seemed to worsen.  Still has an intermittent dry cough. He did have lower extremity edema, but this is since improved.  He began using Primatene Mist 3 weeks ago with last dose this morning around 7AM. Denies fever/chills, syncope, chest pain, abdominal pain, N/V/D, diaphoresis, palpitations, lower extremity pain or color change, or any other complaints.   Past Medical History:  Diagnosis Date  . Allergy    seasonal  . Asthma    mild, intermittent  . CAD, multiple vessel    Three-vessel disease involving mid RCA 85% (PCI with DES), OM 2 80% in branch and ostD1 ~90%. Only the mid RCA with PCI target.   . Diabetes mellitus    type 2  . Fracture of multiple ribs 02/12/2015  . Hyperlipidemia   . Hypertension   . Hypocalcemia 02/12/2015  . Liver function study, abnormal 04/26/2011  . Multiple rib fractures   . Obesity     Patient Active Problem List   Diagnosis Date Noted  . A-fib (HCC) 03/25/2018  . CAD, multiple vessel   . Aortic stenosis, mild 06/28/2016  . Cardiomyopathy, ischemic 02/17/2016  . Chronic combined systolic and diastolic CHF, NYHA class 2 (HCC) 02/17/2016  . Dyspnea   . Hypertensive heart disease   . Hypocalcemia 02/12/2015  . Fracture of multiple ribs 02/12/2015  . Other specified forms of effusion, except tuberculous   . Essential hypertension   . Other and unspecified hyperlipidemia   . Hemothorax on right  01/23/2015  . Liver function study, abnormal 04/26/2011  . Carotid bruit 08/05/2010  . Obesity, Class II, BMI 35-39.9, with comorbidity   . Hyperlipidemia with target LDL less than 70   . Type 2 diabetes mellitus without complication, without long-term current use of insulin (HCC)   . Mild persistent asthma   . Allergic state     Past Surgical History:  Procedure Laterality Date  . APPENDECTOMY  1975  . CARDIAC CATHETERIZATION N/A 02/17/2016   Procedure: Left Heart Cath and Coronary Angiography;  Surgeon: Marykay Lex, MD;  Location: [facility] INVASIVE CV LAB;  Service: Cardiovascular: mRCA 85% (PCI), branch of OM2 80%,  ostD1 90%; EF 35-45%  . CARDIAC CATHETERIZATION N/A 02/17/2016   Procedure: Coronary Stent Intervention;  Surgeon: Marykay Lex, MD;  Location: [facility] INVASIVE CV LAB;  Service: Cardiovascular: mRCA PCI : STENT PROMUS PREM MR 3.5X28 DES  . CATARACT EXTRACTION  2010   b/l  . RETINAL LASER PROCEDURE  1985  . TONSILLECTOMY AND ADENOIDECTOMY  1947  . TRANSTHORACIC ECHOCARDIOGRAM  02/2016   Mild LVH. Moderately reduced EF of 35-40% with diffuse hypokinesis. Indeterminate diastolic function. Likely elevated LA pressures. Mild aortic stenosis (mean gradient 10 mmHg). Mild MR. Mild LA dilation. Mild to moderate PA hypertension with 39 mmHg  . VIDEO ASSISTED THORACOSCOPY (VATS)/THOROCOTOMY Right 01/24/2015   Procedure: VIDEO ASSISTED THORACOSCOPY (VATS) FOR DRAINAGE OF RIGHT HEMOTHORAX;  Surgeon: Alleen Borne, MD;  Location: [facility] OR;  Service: Thoracic;  Laterality: Right;        Home Medications    Prior to Admission medications   Medication Sig Start Date End Date Taking? Authorizing Provider  acetaminophen (TYLENOL) 500 MG tablet Take 500 mg by mouth 3 (three) times daily as needed (pain).    [provider]  ADVAIR DISKUS 100-50 MCG/DOSE AEPB INHALE 1 PUFF INTO THE LUNGS TWICE DAILY 07/20/16   Bradd Canary, MD  amLODipine (NORVASC) 10 MG tablet Take 1 tablet (10 mg  total) by mouth daily. 06/28/16   Marykay Lex, MD  aspirin EC 81 MG EC tablet Take 1 tablet (81 mg total) by mouth daily. 02/13/16   Rai, Ripudeep Kirtland Bouchard, MD  atorvastatin (LIPITOR) 80 MG tablet Take 1 tablet (80 mg total) by mouth daily. 12/26/16 03/26/17  Marykay Lex, MD  carvedilol (COREG) 25 MG tablet Take 1 tablet (25 mg total) by mouth 2 (two) times daily with a meal. 01/09/17   Azalee Course, PA  cholecalciferol (VITAMIN D) 1000 units tablet Take 1,000 Units by mouth daily.    [provider]  clopidogrel (PLAVIX) 75 MG tablet Take 1 tablet (75 mg total) by mouth daily with breakfast. 06/28/16   Marykay Lex, MD  fexofenadine (ALLEGRA) 30 MG tablet Take 30 mg by mouth daily.     [provider]  furosemide (LASIX) 40 MG tablet TAKE 1 TABLET IN THE MORNING AND 1/2 TABLET IN THE EVENING 04/17/16   Azalee Course, PA  glimepiride (AMARYL) 1 MG tablet Take 1 tablet (1 mg total) by mouth 2 (two) times daily. 01/01/17   Bradd Canary, MD  lisinopril (PRINIVIL,ZESTRIL) 40 MG tablet Take 1 tablet (40 mg total) by mouth daily. 06/28/16   Marykay Lex, MD  metFORMIN (GLUCOPHAGE) 1000 MG tablet Take 1 tablet (1,000 mg total) by mouth 2 (two) times daily with a meal. 01/09/17   Bradd Canary, MD  nitroGLYCERIN (NITROSTAT) 0.4 MG SL tablet Place 1 tablet (0.4 mg total) under the tongue every 5 (five) minutes as needed for chest pain. 02/18/16   Little Ishikawa, NP  VENTOLIN HFA 108 [phone] Base) MCG/ACT inhaler INHALE 2 PUFFS INTO THE LUNGS AS NEEDED 11/30/16   Bradd Canary, MD    Family History No family history on file.  Social History Social History   Tobacco Use  . Smoking status: Never Smoker  . Smokeless tobacco: Never Used  Substance Use Topics  . Alcohol use: Yes    Comment: occ  . Drug use: No     Allergies   Patient has no known allergies.   Review of Systems Review of Systems  Constitutional: Positive for fatigue and fever. Negative for chills.  Respiratory:  Positive for cough and shortness of breath.   Cardiovascular: Negative for chest pain and palpitations.  Gastrointestinal: Negative for abdominal pain, diarrhea, nausea and vomiting.  Musculoskeletal: Negative for back pain.  Neurological: Negative for dizziness and syncope.  All other systems reviewed and are negative.    Physical Exam Updated Vital Signs BP (!) 135/106 (BP Location: Right Arm)   Pulse (!) 150   Temp 97.6 F (36.4 C) (Oral)   Resp (!) 28   Ht 6' (1.829 m)   Wt 113.4 kg   SpO2 95%   BMI 33.91 kg/m   Physical Exam  Constitutional: He appears well-developed and well-nourished. No distress.  HENT:  Head: Normocephalic and  atraumatic.  Eyes: Conjunctivae are normal.  Neck: Neck supple.  Cardiovascular: Normal heart sounds and intact distal pulses. An irregularly irregular rhythm present. Tachycardia present.  Pulmonary/Chest: Breath sounds normal. Tachypnea noted. He is in respiratory distress.  Abdominal: Soft. There is no tenderness. There is no guarding.  Musculoskeletal: He exhibits no edema.  Lymphadenopathy:    He has no cervical adenopathy.  Neurological: He is alert.  Skin: Skin is warm and dry. He is not diaphoretic.  Psychiatric: He has a normal mood and affect. His behavior is normal.  Nursing note and vitals reviewed.    ED Treatments / Results  Labs (all labs ordered are listed, but only abnormal results are displayed) Labs Reviewed  COMPREHENSIVE METABOLIC PANEL - Abnormal; Notable for the following components:      Result Value   Glucose, Bld 262 (*)    BUN 25 (*)    Creatinine, Ser 1.51 (*)    Total Bilirubin 2.0 (*)    GFR calc non Af Amer 43 (*)    GFR calc Af Amer 50 (*)    All other components within normal limits  BRAIN NATRIURETIC PEPTIDE - Abnormal; Notable for the following components:   B Natriuretic Peptide 381.9 (*)    All other components within normal limits  TROPONIN I - Abnormal; Notable for the following components:    Troponin I 0.05 (*)    All other components within normal limits  I-STAT CG4 LACTIC ACID, ED - Abnormal; Notable for the following components:   Lactic Acid, Venous 2.72 (*)    All other components within normal limits  CULTURE, BLOOD (ROUTINE X 2)  CULTURE, BLOOD (ROUTINE X 2)  CBC WITH DIFFERENTIAL/PLATELET  URINALYSIS, ROUTINE W REFLEX MICROSCOPIC  RAPID URINE DRUG SCREEN, HOSP PERFORMED  I-STAT CG4 LACTIC ACID, ED    EKG EKG Interpretation  Date/Time:  Monday March 25 2018 12:17:46 EST Ventricular Rate:  169 PR Interval:    QRS Duration: 94 QT Interval:  291 QTC Calculation: 488 R Axis:   90 Text Interpretation:  Atrial fibrillation with rapid V-rate Anterior infarct, old AFib and RVR new since 2017 Confirmed by Marily Memos [phone]) on 03/25/2018 4:03:44 PM   Radiology Dg Chest 2 View  Result Date: 03/25/2018 CLINICAL DATA:  Shortness of breath a tachycardia. Productive cough for 3 weeks. EXAM: CHEST - 2 VIEW COMPARISON:  Chest radiographs 02/10/2016 and CTA 02/11/2016 FINDINGS: The cardiac silhouette remains mildly enlarged. The interstitial markings are mildly increased bilaterally in a symmetric fashion, slightly greater than on the prior radiographs. A small right pleural effusion is slightly larger than on the prior radiographs. No pneumothorax is identified. Old bilateral rib fractures are noted. IMPRESSION: Bilateral interstitial densities suggesting mild edema. Small right pleural effusion. Electronically Signed   By: Sebastian Ache M.D.   On: 03/25/2018 13:16    Procedures .Critical Care Performed by: Anselm Pancoast, PA-C Authorized by: Anselm Pancoast, PA-C   Critical care provider statement:    Critical care time (minutes):  35   Critical care time was exclusive of:  Separately billable procedures and treating other patients   Critical care was necessary to treat or prevent imminent or life-threatening deterioration of the following conditions: Afib with RVR.    Critical care was time spent personally by me on the following activities:  Development of treatment plan with patient or surrogate, discussions with consultants, evaluation of patient's response to treatment, examination of patient, obtaining history from patient or surrogate, review of  old charts, re-evaluation of patient's condition, pulse oximetry, ordering and review of radiographic studies, ordering and review of laboratory studies and ordering and performing treatments and interventions   I assumed direction of critical care for this patient from another provider in my specialty: no     (including critical care time)  This patients CHA2DS2-VASc Score and unadjusted Ischemic Stroke Rate (% per year) is equal to 9.7 % stroke rate/year from a score of 6  Above score calculated as 1 point each if present [CHF, HTN, DM, Vascular=MI/PAD/Aortic Plaque, Age if 65-74, or Male] Above score calculated as 2 points each if present [Age > 75, or Stroke/TIA/TE]   Medications Ordered in ED Medications  diltiazem (CARDIZEM) 1 mg/mL load via infusion 20 mg (20 mg Intravenous Bolus from Bag 03/25/18 1245)    And  diltiazem (CARDIZEM) 100 mg in dextrose 5 % 100 mL (1 mg/mL) infusion (7.5 mg/hr Intravenous Rate/Dose Change 03/25/18 1403)  apixaban (ELIQUIS) tablet 5 mg (5 mg Oral Given 03/25/18 1526)  diltiazem (CARDIZEM) 100 MG injection (100 mg  Given 03/25/18 1243)  furosemide (LASIX) injection 40 mg (40 mg Intravenous Given 03/25/18 1528)     Initial Impression / Assessment and Plan / ED Course  I have reviewed the triage vital signs and the nursing notes.  Pertinent labs & imaging results that were available during my care of the patient were reviewed by me and considered in my medical decision making (see chart for details).  Clinical Course as of Mar 25 1616  Mon Mar 25, 2018  1314 States his breathing feels much better.   [SJ]  1500 Spoke with Dr. Anne Fu, cardiologist.  He will admit the  patient at [facility]. Advises to start the patient on 5 mg Eliquis twice daily rather than heparin. Start 40 mg IV Lasix.Increase Cardizem drip from 7.5 to 10 since patient's heart rate is still a little high (approx. 110-120 just prior to our conversation).   [SJ]    Clinical Course User Index [SJ] Joy, Shawn C, PA-C   Patient presents with shortness of breath.  Noted to be in A. fib RVR, no history of A. Fib.  Improved after successful rate control.  Admitted to [facility] via cardiology for further management.  Findings and plan of care discussed with Marily Memos, MD. Dr. Clayborne Dana personally evaluated and examined this patient.  Vitals:   03/25/18 1433 03/25/18 1500 03/25/18 1515 03/25/18 1556  BP: 116/86 114/66 (!) 123/91 (!) 139/111  Pulse: 72 (!) 116 (!) 101 (!) 110  Resp: 18 20 (!) 22 20  Temp:      TempSrc:      SpO2: 97% 96% 96% 100%  Weight:      Height:         Final Clinical Impressions(s) / ED Diagnoses   Final diagnoses:  Atrial fibrillation with rapid ventricular response [facility])    ED Discharge Orders    None       Concepcion Living 03/25/18 1618    Mesner, Barbara Cower, MD 03/25/18 1639

## 2018-03-25 NOTE — ED Provider Notes (Signed)
Medical screening examination/treatment/procedure(s) were conducted as a shared visit with non-physician practitioner(s) and myself.  I personally evaluated the patient during the encounter.  77 year old male here with new onset atrial fibrillation with rapid ventricular response after taking the medicine and over-the-counter called Primatene which is basically inhaled epinephrine.  States that he ran of his albuterol and was using this for shortness of breath and asthma exacerbation had a couple weeks ago but had persistent worsening shortness of breath was using a lot of this medication try to help it get better.  His blood pressure here is normal.  He has some lower extremity swelling and mild crackles in the bases but no other evidence of overt heart failure or instability.  Plan will be to start him on diltiazem drip and infusion and admitted to the hospital.  EKG Interpretation  Date/Time:  Monday March 25 2018 12:17:46 EST Ventricular Rate:  169 PR Interval:    QRS Duration: 94 QT Interval:  291 QTC Calculation: 488 R Axis:   90 Text Interpretation:  Atrial fibrillation with rapid V-rate Anterior infarct, old AFib and RVR new since 2017 Confirmed by Marily Memos [phone]) on 03/25/2018 4:03:44 PM      Dalylah Ramey, Barbara Cower, MD 03/25/18 1641

## 2018-03-25 NOTE — Plan of Care (Signed)

## 2018-03-25 NOTE — ED Notes (Signed)
ED Provider at bedside. 

## 2018-03-26 ENCOUNTER — Inpatient Hospital Stay: Payer: Self-pay

## 2018-03-26 ENCOUNTER — Inpatient Hospital Stay (HOSPITAL_COMMUNITY): Payer: Medicare Other

## 2018-03-26 ENCOUNTER — Ambulatory Visit (HOSPITAL_COMMUNITY): Payer: Medicare Other

## 2018-03-26 DIAGNOSIS — I340 Nonrheumatic mitral (valve) insufficiency: Secondary | ICD-10-CM | POA: Diagnosis not present

## 2018-03-26 DIAGNOSIS — E119 Type 2 diabetes mellitus without complications: Secondary | ICD-10-CM | POA: Diagnosis not present

## 2018-03-26 DIAGNOSIS — E1165 Type 2 diabetes mellitus with hyperglycemia: Secondary | ICD-10-CM | POA: Diagnosis present

## 2018-03-26 DIAGNOSIS — I25119 Atherosclerotic heart disease of native coronary artery with unspecified angina pectoris: Secondary | ICD-10-CM | POA: Diagnosis not present

## 2018-03-26 DIAGNOSIS — E1122 Type 2 diabetes mellitus with diabetic chronic kidney disease: Secondary | ICD-10-CM | POA: Diagnosis not present

## 2018-03-26 DIAGNOSIS — I110 Hypertensive heart disease with heart failure: Secondary | ICD-10-CM | POA: Diagnosis not present

## 2018-03-26 DIAGNOSIS — I4819 Other persistent atrial fibrillation: Secondary | ICD-10-CM | POA: Diagnosis not present

## 2018-03-26 DIAGNOSIS — I350 Nonrheumatic aortic (valve) stenosis: Secondary | ICD-10-CM | POA: Diagnosis present

## 2018-03-26 DIAGNOSIS — I255 Ischemic cardiomyopathy: Secondary | ICD-10-CM | POA: Diagnosis present

## 2018-03-26 DIAGNOSIS — N183 Chronic kidney disease, stage 3 (moderate): Secondary | ICD-10-CM | POA: Diagnosis not present

## 2018-03-26 DIAGNOSIS — J9811 Atelectasis: Secondary | ICD-10-CM | POA: Diagnosis not present

## 2018-03-26 DIAGNOSIS — I248 Other forms of acute ischemic heart disease: Secondary | ICD-10-CM | POA: Diagnosis not present

## 2018-03-26 DIAGNOSIS — I959 Hypotension, unspecified: Secondary | ICD-10-CM | POA: Diagnosis not present

## 2018-03-26 DIAGNOSIS — E876 Hypokalemia: Secondary | ICD-10-CM | POA: Diagnosis present

## 2018-03-26 DIAGNOSIS — I4891 Unspecified atrial fibrillation: Secondary | ICD-10-CM | POA: Diagnosis not present

## 2018-03-26 DIAGNOSIS — I5023 Acute on chronic systolic (congestive) heart failure: Secondary | ICD-10-CM | POA: Diagnosis not present

## 2018-03-26 DIAGNOSIS — R140 Abdominal distension (gaseous): Secondary | ICD-10-CM | POA: Diagnosis not present

## 2018-03-26 DIAGNOSIS — I480 Paroxysmal atrial fibrillation: Secondary | ICD-10-CM | POA: Diagnosis not present

## 2018-03-26 DIAGNOSIS — Z452 Encounter for adjustment and management of vascular access device: Secondary | ICD-10-CM | POA: Diagnosis not present

## 2018-03-26 DIAGNOSIS — I509 Heart failure, unspecified: Secondary | ICD-10-CM | POA: Diagnosis not present

## 2018-03-26 DIAGNOSIS — I2510 Atherosclerotic heart disease of native coronary artery without angina pectoris: Secondary | ICD-10-CM | POA: Diagnosis not present

## 2018-03-26 DIAGNOSIS — Z9842 Cataract extraction status, left eye: Secondary | ICD-10-CM | POA: Diagnosis not present

## 2018-03-26 DIAGNOSIS — N179 Acute kidney failure, unspecified: Secondary | ICD-10-CM | POA: Diagnosis not present

## 2018-03-26 DIAGNOSIS — J4520 Mild intermittent asthma, uncomplicated: Secondary | ICD-10-CM | POA: Diagnosis present

## 2018-03-26 DIAGNOSIS — I5043 Acute on chronic combined systolic (congestive) and diastolic (congestive) heart failure: Secondary | ICD-10-CM | POA: Diagnosis not present

## 2018-03-26 DIAGNOSIS — I130 Hypertensive heart and chronic kidney disease with heart failure and stage 1 through stage 4 chronic kidney disease, or unspecified chronic kidney disease: Secondary | ICD-10-CM | POA: Diagnosis not present

## 2018-03-26 DIAGNOSIS — J302 Other seasonal allergic rhinitis: Secondary | ICD-10-CM | POA: Diagnosis present

## 2018-03-26 DIAGNOSIS — Z9841 Cataract extraction status, right eye: Secondary | ICD-10-CM | POA: Diagnosis not present

## 2018-03-26 DIAGNOSIS — E872 Acidosis: Secondary | ICD-10-CM | POA: Diagnosis not present

## 2018-03-26 DIAGNOSIS — I482 Chronic atrial fibrillation, unspecified: Secondary | ICD-10-CM | POA: Diagnosis not present

## 2018-03-26 DIAGNOSIS — E785 Hyperlipidemia, unspecified: Secondary | ICD-10-CM | POA: Diagnosis not present

## 2018-03-26 DIAGNOSIS — Z23 Encounter for immunization: Secondary | ICD-10-CM | POA: Diagnosis not present

## 2018-03-26 DIAGNOSIS — Z955 Presence of coronary angioplasty implant and graft: Secondary | ICD-10-CM | POA: Diagnosis not present

## 2018-03-26 DIAGNOSIS — Z7982 Long term (current) use of aspirin: Secondary | ICD-10-CM | POA: Diagnosis not present

## 2018-03-26 LAB — TROPONIN I
Troponin I: 0.04 ng/mL (ref <0.03)
Troponin I: 0.03 ng/mL (ref <0.03)

## 2018-03-26 LAB — COOXEMETRY PANEL
CARBOXYHEMOGLOBIN: 1.7 % — AB (ref 0.5–1.5)
Methemoglobin: 1 % (ref 0.0–1.5)
O2 SAT: 64.6 %
Total hemoglobin: 14.6 g/dL (ref 12.0–16.0)

## 2018-03-26 LAB — BASIC METABOLIC PANEL
ANION GAP: 12 (ref 5–15)
BUN: 25 mg/dL — ABNORMAL HIGH (ref 8–23)
CALCIUM: 8.7 mg/dL — AB (ref 8.9–10.3)
CO2: 22 mmol/L (ref 22–32)
CREATININE: 1.62 mg/dL — AB (ref 0.61–1.24)
Chloride: 102 mmol/L (ref 98–111)
GFR calc Af Amer: 46 mL/min — ABNORMAL LOW (ref >60)
GFR, EST NON AFRICAN AMERICAN: 40 mL/min — AB (ref >60)
GLUCOSE: 234 mg/dL — AB (ref 70–99)
Potassium: 3.7 mmol/L (ref 3.5–5.1)
Sodium: 136 mmol/L (ref 135–145)

## 2018-03-26 LAB — ECHOCARDIOGRAM COMPLETE
Height: 72 in
Weight: 4567.93 oz

## 2018-03-26 LAB — GLUCOSE, CAPILLARY
GLUCOSE-CAPILLARY: 232 mg/dL — AB (ref 70–99)
GLUCOSE-CAPILLARY: 176 mg/dL — AB (ref 70–99)
GLUCOSE-CAPILLARY: 184 mg/dL — AB (ref 70–99)
GLUCOSE-CAPILLARY: 172 mg/dL — AB (ref 70–99)

## 2018-03-26 LAB — CBC
HEMATOCRIT: 45.8 % (ref 39.0–52.0)
HEMOGLOBIN: 14 g/dL (ref 13.0–17.0)
MCH: 28 pg (ref 26.0–34.0)
MCHC: 30.6 g/dL (ref 30.0–36.0)
MCV: 91.6 fL (ref 80.0–100.0)
Platelets: 249 10*3/uL (ref 150–400)
RBC: 5 MIL/uL (ref 4.22–5.81)
RDW: 12.9 % (ref 11.5–15.5)
WBC: 9.2 10*3/uL (ref 4.0–10.5)
nRBC: 0 % (ref 0.0–0.2)

## 2018-03-26 LAB — APTT: APTT: 74 s — AB (ref 24–36)

## 2018-03-26 LAB — HEPARIN LEVEL (UNFRACTIONATED): Heparin Unfractionated: 0.96 IU/mL — ABNORMAL HIGH (ref 0.30–0.70)

## 2018-03-26 MED ORDER — METOPROLOL TARTRATE 25 MG PO TABS
25.0000 mg | ORAL_TABLET | Freq: Two times a day (BID) | ORAL | Status: DC
  Administered 2018-03-26 – 2018-03-27 (×2): 25 mg via ORAL
  Filled 2018-03-26 (×2): qty 1

## 2018-03-26 MED ORDER — PERFLUTREN LIPID MICROSPHERE
INTRAVENOUS | Status: AC
  Administered 2018-03-26: 2 mL via INTRAVENOUS
  Filled 2018-03-26: qty 10

## 2018-03-26 MED ORDER — FUROSEMIDE 10 MG/ML IJ SOLN: 40.0000 mg | Freq: Two times a day (BID) | INTRAMUSCULAR | Status: DC

## 2018-03-26 MED ORDER — FUROSEMIDE 10 MG/ML IJ SOLN
80.0000 mg | Freq: Two times a day (BID) | INTRAMUSCULAR | Status: DC
  Administered 2018-03-26 – 2018-03-27 (×2): 80 mg via INTRAVENOUS
  Filled 2018-03-26 (×2): qty 8

## 2018-03-26 MED ORDER — PERFLUTREN LIPID MICROSPHERE
1.0000 mL | INTRAVENOUS | Status: AC | PRN
  Administered 2018-03-26: 2 mL via INTRAVENOUS
  Filled 2018-03-26: qty 10

## 2018-03-26 NOTE — Progress Notes (Signed)
ANTICOAGULATION CONSULT NOTE   Pharmacy Consult for heparin Indication: atrial fibrillation  No Known Allergies  Patient Measurements: Height: 6' (182.9 cm) Weight: 285 lb 7.9 oz (129.5 kg) IBW/kg (Calculated) : 77.6 Heparin Dosing Weight: 102kg  Vital Signs: Temp: 97.4 F (36.3 C) (11/12 0623) Temp Source: Oral (11/12 0623) BP: 86/60 (11/12 1053) Pulse Rate: 81 (11/12 0918)  Labs: Recent Labs    03/25/18 1225 03/25/18 2018 03/25/18 2247 03/26/18 0238 03/26/18 0714 03/26/18 1135  HGB 14.9  --   --   --  14.0  --   HCT 47.0  --   --   --  45.8  --   PLT 254  --   --   --  249  --   APTT  --   --  39*  --   --  74*  HEPARINUNFRC  --   --  1.24*  --   --  0.96*  CREATININE 1.51*  --   --  1.62*  --   --   TROPONINI 0.05* 0.04*  --  0.04* 0.03*  --     Estimated Creatinine Clearance: 54 mL/min (A) (by C-G formula based on SCr of 1.62 mg/dL (H)).   Medical History: Past Medical History:  Diagnosis Date  . Allergy    seasonal  . Asthma    mild, intermittent  . CAD, multiple vessel    Three-vessel disease involving mid RCA 85% (PCI with DES), OM 2 80% in branch and ostD1 ~90%. Only the mid RCA with PCI target.   . Chronic combined systolic and diastolic CHF (congestive heart failure) (HCC)   . CKD (chronic kidney disease), stage III (HCC)   . Diabetes mellitus    type 2  . Fracture of multiple ribs 02/12/2015  . Hyperlipidemia   . Hypertension   . Hypocalcemia 02/12/2015  . Liver function study, abnormal 04/26/2011  . Multiple rib fractures   . Obesity     Medications:  Scheduled:  . aspirin EC  81 mg Oral Daily  . atorvastatin  80 mg Oral q1800  . glimepiride  1 mg Oral BID  . Influenza vac split quadrivalent PF  0.5 mL Intramuscular Tomorrow-1000  . insulin aspart  0-9 Units Subcutaneous TID WC  . loratadine  10 mg Oral Daily  . metoprolol tartrate  25 mg Oral BID  . mometasone-formoterol  2 puff Inhalation BID  . pneumococcal 23 valent vaccine  0.5  mL Intramuscular Tomorrow-1000  . sodium chloride flush  3 mL Intravenous Q12H    Assessment: 77 YOM presenting with SOB, in afib in MCHP, no AC PTA. Consult placed to start heparin infusion; however, plan changed to receive apixaban (last dose on 11/11@1526 ).   No anticoag PTA. In Afib on arrival, started on Eliquis (last dose 11/11 at 1530) but now transitioning to heparin for possible cath. CBC stable. Heparin level falsely elevated from Eliquis dose and aPTT therapeutic at 74.   Goal of Therapy:  Heparin level 0.3-0.7 units/ml APTT: 66-102 sec Monitor platelets by anticoagulation protocol: Yes   Plan:  Continue heparin gtt at 1,500 units/hr Monitor daily heparin level, CBC, s/s of bleed  Enzo Bi, PharmD, BCPS Clinical Pharmacist Phone number [phone] 03/26/2018 12:06 PM

## 2018-03-26 NOTE — Progress Notes (Addendum)
I discussed echo results with Dr. Gala Romney given drop in EF and hemodynamics today. He suggests to place PICC to measure co-ox to assess if patient may be low output. MD reviewed Cr and confirms PICC would be warranted in this case. Orders placed but may not be this PM per IV team. Spoke with nurse who will update patient. She also indicates pt has been resistant to using urinal so I/O's may not be accurate. Nurse has continued to reinforce importance of using. BP has come back up this afternoon and HR remains stable. I signed out case to Dr. Mayford Knife who agrees that now that BP is elevated, would suggest another trial of diuresis. Will re-initiate Lasix 40mg  IV BID. Dayna Dunn PA-C

## 2018-03-26 NOTE — Progress Notes (Signed)
PICC line in place. Co-ox drawn and resulted. First CVP 21.

## 2018-03-26 NOTE — Progress Notes (Addendum)
Progress Note  Patient Name: David Huynh Date of Encounter: 03/26/2018  Primary Cardiologist: [name], MD  Subjective   He states overnight he was able to lay more flat for the first time in weeks. Legs are still swollen but improved. UOP and wt have not changed much in EMR but he does report good UOP. He states 3 weeks ago he could see his ankles. Still SOB with activity. He wants to go home (and mentions this several times today) but understands the need for ongoing workup. He does admit about an hour ago he began feeling more nauseated. Remains unaware of his afib. No CP. Rate much improved, now 70s-80s. Dilt weaned to 5.  Inpatient Medications    Scheduled Meds: . aspirin EC  81 mg Oral Daily  . atorvastatin  80 mg Oral q1800  . glimepiride  1 mg Oral BID  . Influenza vac split quadrivalent PF  0.5 mL Intramuscular Tomorrow-1000  . insulin aspart  0-9 Units Subcutaneous TID WC  . loratadine  10 mg Oral Daily  . metoprolol tartrate  25 mg Oral Q6H  . mometasone-formoterol  2 puff Inhalation BID  . pneumococcal 23 valent vaccine  0.5 mL Intramuscular Tomorrow-1000  . sodium chloride flush  3 mL Intravenous Q12H   Continuous Infusions: . sodium chloride    . dilTIAZem HCl-Dextrose 5 mg/hr (03/25/18 2123)  . heparin 1,500 Units/hr (03/26/18 0326)   PRN Meds: sodium chloride, acetaminophen, levalbuterol, nitroGLYCERIN, ondansetron (ZOFRAN) IV, sodium chloride flush   Vital Signs    Vitals:   03/25/18 1927 03/26/18 0623 03/26/18 0803 03/26/18 0918  BP: (!) 138/126 (!) 130/99 (!) 104/91   Pulse: (!) 107 89 99 81  Resp:    18  Temp: (!) 97.3 F (36.3 C) (!) 97.4 F (36.3 C)    TempSrc: Oral Oral    SpO2: 100% 100%  96%  Weight:  129.5 kg    Height:        Intake/Output Summary (Last 24 hours) at 03/26/2018 1015 Last data filed at 03/26/2018 0908 Gross per 24 hour  Intake 744.26 ml  Output 675 ml  Net 69.26 ml   Filed Weights   03/25/18 1209  03/25/18 1818 03/26/18 0623  Weight: 113.4 kg 129.4 kg 129.5 kg    Telemetry    Atrial fib HR currently 70s - Personally Reviewed  Physical Exam   GEN: No acute distress.  HEENT: Normocephalic, atraumatic, sclera non-icteric. Neck: No JVD or bruits. Cardiac: Irregularly irregular, rate controlled, no murmurs, rubs, or gallops.  Radials/DP/PT 1+ and equal bilaterally.  Respiratory: Diminished BS throughout but particularly at R base. No wheezes, rales or rhonchi. Breathing is unlabored. GI: Soft, nontender, non-distended, BS +x 4. MS: no deformity. Extremities: No clubbing or cyanosis. 1+ BLE pitting edema. Distal pedal pulses are 2+ and equal bilaterally. Neuro:  AAOx3. Follows commands. Psych:  Responds to questions appropriately with a normal affect.  Labs    Chemistry Recent Labs  Lab 03/25/18 1225 03/26/18 0238  NA 137 136  K 3.7 3.7  CL 101 102  CO2 23 22  GLUCOSE 262* 234*  BUN 25* 25*  CREATININE 1.51* 1.62*  CALCIUM 9.2 8.7*  PROT 7.6  --   ALBUMIN 4.1  --   AST 27  --   ALT 25  --   ALKPHOS 70  --   BILITOT 2.0*  --   GFRNONAA 43* 40*  GFRAA 50* 46*  ANIONGAP 13 12  Hematology Recent Labs  Lab 03/25/18 1225 03/26/18 0714  WBC 9.3 9.2  RBC 5.14 5.00  HGB 14.9 14.0  HCT 47.0 45.8  MCV 91.4 91.6  MCH 29.0 28.0  MCHC 31.7 30.6  RDW 13.1 12.9  PLT 254 249    Cardiac Enzymes Recent Labs  Lab 03/25/18 1225 03/25/18 2018 03/26/18 0238 03/26/18 0714  TROPONINI 0.05* 0.04* 0.04* 0.03*   No results for input(s): TROPIPOC in the last 168 hours.   BNP Recent Labs  Lab 03/25/18 1225  BNP 381.9*     DDimer No results for input(s): DDIMER in the last 168 hours.   Radiology    Dg Chest 2 View  Result Date: 03/25/2018 CLINICAL DATA:  Shortness of breath a tachycardia. Productive cough for 3 weeks. EXAM: CHEST - 2 VIEW COMPARISON:  Chest radiographs 02/10/2016 and CTA 02/11/2016 FINDINGS: The cardiac silhouette remains mildly enlarged.  The interstitial markings are mildly increased bilaterally in a symmetric fashion, slightly greater than on the prior radiographs. A small right pleural effusion is slightly larger than on the prior radiographs. No pneumothorax is identified. Old bilateral rib fractures are noted. IMPRESSION: Bilateral interstitial densities suggesting mild edema. Small right pleural effusion. Electronically Signed   By: Sebastian Ache M.D.   On: 03/25/2018 13:16   Patient Profile     77 y.o. male with history of CAD (multivessel, only vessel amenable for PCI was RCA s/p DES 06/2015), chronic combined CHF (EF 35-40% in 06/2015, improved to 55-60% 07/2016), HTN, HLD, DM, CKD III, asthma who was admitted with worsening SOB and atrial fib RVR. He stopped taking all of his medications about a week ago in hopes he would feel better and began using an Primatene (OTC epinephrine) inhaler. Workup consistent with CHF, mild AKI on CKD, mildly elevated troponin.  Assessment & Plan    1. New onset atrial fib RVR - unclear duration given symptoms for several weeks. Not clear what precipitated what here - AF leading to CHF or vice versa. This was certainly not helped by patient's use of OTC inhaled epinephrine or lack of medication over the last week. Will stop diltiazem. Will discuss plan for consolidation of metoprolol with MD, as well as plan for anticoagulation. Last night given patient's WOB we were concerned that he may rule in for MI or have recurrent significant LV dysfunction so elected to go with Eliquis. Troponins remained mildly flat. If EF has dropped, may need to consider earlier restoration of NSR during admission. Addendum: SBP noted in followup to be 89/66, confirmed manually. Reviewed with Dr. Anne Fu and plan to hold off further Lasix and decrease metoprolol to BID. Will f/u up echo.  2. Acute on chronic combined CHF - dry weight not clear. HE was in the 260s-270s in 2018, not seen this year. Weight is 285 this admission.  2D echo pending. Will review plans for further diuresis with MD given his elevated creatinine. Hold ACEI/ARB/spiro given AK. Narrowed pulse pressure noted.  3. CAD with mildly elevated troponin - no chest pain. Last cath 2017 with multivessel disease. Hold Plavix given plan for initiation of NOAC this admission (and dose given earlier today) but continue ASA. If recurrent LV dysfunction is noted or troponin rises, may need to consider repeat cath this admission. Resume statin.  4. Lactic acidosis - suspect demand-related. Normalized on recheck.  4. AKI on CKD stage III - suspect driven by cardiorenal/perfusion from above medical issues. Hold off on ACEI/spiro but follow with diuresis. Consider renal  US if remains elevated above baseline.  5. Hyperglycemia/diabetes mellitus - continue glimepiride but hold metformin given lactic acidosis. Continue SSI.  6. HTN - follow BP with above measures.  7. Asthma - Advair resumed. No infiltrate on CXR. Nebs ordered for PRN but did not get - breathing improved with cardiac measures.  For questions or updates, please contact [facility] Please consult [email or web address] for contact info under Cardiology/STEMI.  Signed, Laurann Montana, PA-C 03/26/2018, 10:15 AM    Personally seen and examined. Agree with above.  Presumed acute systolic heart failure, new onset atrial fibrillation with rapid ventricular response, morbid obesity, diabetes with hypertension  I am concerned that his current constellation of symptoms may be associated with a return of decreased ejection fraction or dilated cardiomyopathy.  Awaiting echocardiogram.  His atrial fibrillation with rapid ventricular response is now under much better control.  This is new onset atrial fibrillation for him.  Currently we have him on IV heparin until we decide whether or not he needs cardiac catheterization.  In 2017 he did have three-vessel disease with PCI only to RCA, if ejection fraction once  again is quite low, we could certainly consider repeat catheterization to see if there is any evidence of advancement.  Of course, no ACE inhibitor because of hypotension at this time.  No Entresto.  No spironolactone.  On physical exam clearly he is volume overloaded with 3+ pitting edema bilateral lower extremities and JVD up to his jawline.  Irregularly irregular heart rate currently in the 70s much better control.  Abdomen is protuberant obese, nonacute.  Neurologic nonfocal.  At times jovial with me.  His breathing does appear to be improved from yesterday evening but not at baseline.  BNP was in the 300 range, could be falsely lowered by his obesity.  We are continuing with his statin and aspirin combination.  Lactic acidosis could be from hypoperfusion.  Because his hypotension is present currently with some nausea which could be from low output, we will hold off on further diuresis at this point and pull back on his metoprolol to 25 mg twice daily.  Atrial fibrillation seems to be better.  Medication compliance has been an issue.  Continue to encourage.  We will switch him to inpatient status.  He is likely to be here more than 2 midnights.  He is requiring IV medication.  High risk.  Donato Schultz, MD

## 2018-03-26 NOTE — Progress Notes (Signed)
Patient complaining of nausea; BP checked and found to be 89/79; 86/60 on manual recheck. Patient helped back to bed. IV cardizem stopped per order. Ronie Spies PA on floor at this time and made aware. BP rechecked at 1156; 113/86 at this time.

## 2018-03-26 NOTE — Progress Notes (Signed)
Per Ronie Spies PA: spoke with Dr. Gala Romney, lasix to be re-initiated at 80mg  IV BID instead of 40mg .

## 2018-03-26 NOTE — Progress Notes (Signed)
Peripherally Inserted Central Catheter/Midline Placement  The IV Nurse has discussed with the patient and/or persons authorized to consent for the patient, the purpose of this procedure and the potential benefits and risks involved with this procedure.  The benefits include less needle sticks, lab draws from the catheter, and the patient may be discharged home with the catheter. Risks include, but not limited to, infection, bleeding, blood clot (thrombus formation), and puncture of an artery; nerve damage and irregular heartbeat and possibility to perform a PICC exchange if needed/ordered by physician.  Alternatives to this procedure were also discussed.  Bard Power PICC patient education guide, fact sheet on infection prevention and patient information card has been provided to patient /or left at bedside.    PICC/Midline Placement Documentation  PICC Double Lumen 03/26/18 Right Basilic 42 cm 0 cm (Active)  Indication for Insertion or Continuance of Line Vasoactive infusions 03/26/2018  9:20 PM  Exposed Catheter (cm) 0 cm 03/26/2018  9:20 PM  Site Assessment Clean;Dry;Intact 03/26/2018  9:20 PM  Lumen #1 Status Flushed;Saline locked;Blood return noted 03/26/2018  9:20 PM  Lumen #2 Status Flushed;Saline locked;Blood return noted 03/26/2018  9:20 PM  Dressing Type Transparent 03/26/2018  9:20 PM  Dressing Status Clean;Intact;Dry;Antimicrobial disc in place 03/26/2018  9:20 PM  Dressing Change Due 04/02/18 03/26/2018  9:20 PM       Ethelda Chick 03/26/2018, 9:21 PM

## 2018-03-26 NOTE — Progress Notes (Signed)
Inpatient Diabetes Program Recommendations  AACE/ADA: New Consensus Statement on Inpatient Glycemic Control (2015)  Target Ranges:  Prepandial:   less than 140 mg/dL      Peak postprandial:   less than 180 mg/dL (1-2 hours)      Critically ill patients:  140 - 180 mg/dL   Lab Results  Component Value Date   GLUCAP 176 (H) 03/26/2018   HGBA1C 8.8 (H) 12/29/2016    Review of Glycemic Control Results for David Huynh, David Huynh (MRN [id number]) as of 03/26/2018 14:18  Ref. Range 03/25/2018 21:12 03/26/2018 07:39 03/26/2018 11:18  Glucose-Capillary Latest Ref Range: 70 - 99 mg/dL 943 (H) 200 (H) 379 (H)   Diabetes history: Type 2 DM Outpatient Diabetes medications: Metformin 1000 mg BID, Amaryl 1 mg BID Current orders for Inpatient glycemic control: Amaryl 1 mg BID, Novolog 0-9 units TID  Inpatient Diabetes Program Recommendations:    If to remain inpatient:  Noted last A1C was from 12/2017, consider repeating A1C.   Consider adding Levemir 10 units QHS.    Thanks, Lujean Rave, MSN, RNC-OB Diabetes Coordinator [phone] (8a-5p)

## 2018-03-26 NOTE — Progress Notes (Signed)
Pt concerned about placement of PICC line and frustrated that he had not seen a cardiologist. He said he would rather wait until after Thanksgiving to continue with treatment. I paged Charna Busman from cardiology, and she came to the bedside to speak to patient. IV nurse at bedside now placing PICC.

## 2018-03-26 NOTE — Progress Notes (Signed)
  Echocardiogram 2D Echocardiogram has been performed.  Roosvelt Maser F 03/26/2018, 2:36 PM

## 2018-03-27 LAB — GLUCOSE, CAPILLARY
GLUCOSE-CAPILLARY: 172 mg/dL — AB (ref 70–99)
GLUCOSE-CAPILLARY: 208 mg/dL — AB (ref 70–99)
Glucose-Capillary: 128 mg/dL — ABNORMAL HIGH (ref 70–99)
Glucose-Capillary: 211 mg/dL — ABNORMAL HIGH (ref 70–99)

## 2018-03-27 LAB — CBC
HEMATOCRIT: 41.7 % (ref 39.0–52.0)
HEMOGLOBIN: 13.2 g/dL (ref 13.0–17.0)
MCH: 29 pg (ref 26.0–34.0)
MCHC: 31.7 g/dL (ref 30.0–36.0)
MCV: 91.6 fL (ref 80.0–100.0)
NRBC: 0 % (ref 0.0–0.2)
Platelets: 223 10*3/uL (ref 150–400)
RBC: 4.55 MIL/uL (ref 4.22–5.81)
RDW: 12.9 % (ref 11.5–15.5)
WBC: 8.8 10*3/uL (ref 4.0–10.5)

## 2018-03-27 LAB — BASIC METABOLIC PANEL
ANION GAP: 9 (ref 5–15)
Anion gap: 11 (ref 5–15)
BUN: 31 mg/dL — AB (ref 8–23)
BUN: 37 mg/dL — ABNORMAL HIGH (ref 8–23)
CHLORIDE: 102 mmol/L (ref 98–111)
CHLORIDE: 102 mmol/L (ref 98–111)
CO2: 26 mmol/L (ref 22–32)
CO2: 23 mmol/L (ref 22–32)
CREATININE: 1.72 mg/dL — AB (ref 0.61–1.24)
Calcium: 8.4 mg/dL — ABNORMAL LOW (ref 8.9–10.3)
Calcium: 8.8 mg/dL — ABNORMAL LOW (ref 8.9–10.3)
Creatinine, Ser: 1.93 mg/dL — ABNORMAL HIGH (ref 0.61–1.24)
GFR calc non Af Amer: 32 mL/min — ABNORMAL LOW (ref >60)
GFR, EST AFRICAN AMERICAN: 43 mL/min — AB (ref >60)
GFR, EST AFRICAN AMERICAN: 37 mL/min — AB (ref >60)
GFR, EST NON AFRICAN AMERICAN: 37 mL/min — AB (ref >60)
GLUCOSE: 134 mg/dL — AB (ref 70–99)
Glucose, Bld: 198 mg/dL — ABNORMAL HIGH (ref 70–99)
POTASSIUM: 3.1 mmol/L — AB (ref 3.5–5.1)
Potassium: 4.2 mmol/L (ref 3.5–5.1)
SODIUM: 136 mmol/L (ref 135–145)
Sodium: 137 mmol/L (ref 135–145)

## 2018-03-27 LAB — MAGNESIUM: Magnesium: 2.2 mg/dL (ref 1.7–2.4)

## 2018-03-27 LAB — APTT: APTT: 93 s — AB (ref 24–36)

## 2018-03-27 LAB — COOXEMETRY PANEL
Carboxyhemoglobin: 1.3 % (ref 0.5–1.5)
METHEMOGLOBIN: 1.7 % — AB (ref 0.0–1.5)
O2 Saturation: 65.9 %
TOTAL HEMOGLOBIN: 13.6 g/dL (ref 12.0–16.0)

## 2018-03-27 LAB — HEPARIN LEVEL (UNFRACTIONATED): Heparin Unfractionated: 0.74 IU/mL — ABNORMAL HIGH (ref 0.30–0.70)

## 2018-03-27 MED ORDER — POTASSIUM CHLORIDE CRYS ER 20 MEQ PO TBCR
40.0000 meq | EXTENDED_RELEASE_TABLET | Freq: Every day | ORAL | Status: DC
  Administered 2018-03-27 – 2018-03-30 (×5): 40 meq via ORAL
  Filled 2018-03-27 (×6): qty 2

## 2018-03-27 MED ORDER — AMIODARONE HCL IN DEXTROSE 360-4.14 MG/200ML-% IV SOLN
60.0000 mg/h | INTRAVENOUS | Status: AC
  Administered 2018-03-27 (×2): 60 mg/h via INTRAVENOUS
  Filled 2018-03-27 (×2): qty 200

## 2018-03-27 MED ORDER — SPIRONOLACTONE 12.5 MG HALF TABLET
12.5000 mg | ORAL_TABLET | Freq: Every day | ORAL | Status: DC
  Administered 2018-03-27: 12.5 mg via ORAL
  Filled 2018-03-27 (×2): qty 1

## 2018-03-27 MED ORDER — POTASSIUM CHLORIDE CRYS ER 20 MEQ PO TBCR
40.0000 meq | EXTENDED_RELEASE_TABLET | Freq: Once | ORAL | Status: AC
  Administered 2018-03-27: 40 meq via ORAL

## 2018-03-27 MED ORDER — AMIODARONE HCL IN DEXTROSE 360-4.14 MG/200ML-% IV SOLN
30.0000 mg/h | INTRAVENOUS | Status: DC
  Administered 2018-03-27 – 2018-04-02 (×10): 30 mg/h via INTRAVENOUS
  Filled 2018-03-27 (×14): qty 200

## 2018-03-27 MED ORDER — FUROSEMIDE 10 MG/ML IJ SOLN: 80.0000 mg | Freq: Three times a day (TID) | INTRAMUSCULAR | Status: DC

## 2018-03-27 MED ORDER — FUROSEMIDE 10 MG/ML IJ SOLN
15.0000 mg/h | INTRAVENOUS | Status: DC
  Administered 2018-03-27: 12 mg/h via INTRAVENOUS
  Administered 2018-03-29 – 2018-03-30 (×2): 15 mg/h via INTRAVENOUS
  Filled 2018-03-27 (×8): qty 25

## 2018-03-27 MED ORDER — AMIODARONE LOAD VIA INFUSION
150.0000 mg | Freq: Once | INTRAVENOUS | Status: AC
  Administered 2018-03-27: 150 mg via INTRAVENOUS
  Filled 2018-03-27: qty 83.34

## 2018-03-27 MED ORDER — APIXABAN 5 MG PO TABS
5.0000 mg | ORAL_TABLET | Freq: Two times a day (BID) | ORAL | Status: DC
  Administered 2018-03-27 – 2018-04-02 (×13): 5 mg via ORAL
  Filled 2018-03-27 (×13): qty 1

## 2018-03-27 NOTE — Progress Notes (Signed)
ANTICOAGULATION CONSULT NOTE   Pharmacy Consult for heparin Indication: atrial fibrillation  No Known Allergies  Patient Measurements: Height: 6' (182.9 cm) Weight: 285 lb 7.9 oz (129.5 kg) IBW/kg (Calculated) : 77.6 Heparin Dosing Weight: 102kg  Vital Signs: Temp: 97.6 F (36.4 C) (11/13 0722) Temp Source: Oral (11/13 0722) BP: 129/104 (11/13 0722) Pulse Rate: 91 (11/13 0722)  Labs: Recent Labs    03/25/18 1225 03/25/18 2018 03/25/18 2247 03/26/18 0238 03/26/18 0714 03/26/18 1135 03/27/18 0457 03/27/18 0557  HGB 14.9  --   --   --  14.0  --  13.2  --   HCT 47.0  --   --   --  45.8  --  41.7  --   PLT 254  --   --   --  249  --  223  --   APTT  --   --  39*  --   --  74* 93*  --   HEPARINUNFRC  --   --  1.24*  --   --  0.96*  --  0.74*  CREATININE 1.51*  --   --  1.62*  --   --  1.72*  --   TROPONINI 0.05* 0.04*  --  0.04* 0.03*  --   --   --     Estimated Creatinine Clearance: 50.9 mL/min (A) (by C-G formula based on SCr of 1.72 mg/dL (H)).   Medical History: Past Medical History:  Diagnosis Date  . Allergy    seasonal  . Asthma    mild, intermittent  . CAD, multiple vessel    Three-vessel disease involving mid RCA 85% (PCI with DES), OM 2 80% in branch and ostD1 ~90%. Only the mid RCA with PCI target.   . Chronic combined systolic and diastolic CHF (congestive heart failure) (HCC)   . CKD (chronic kidney disease), stage III (HCC)   . Diabetes mellitus    type 2  . Fracture of multiple ribs 02/12/2015  . Hyperlipidemia   . Hypertension   . Hypocalcemia 02/12/2015  . Liver function study, abnormal 04/26/2011  . Multiple rib fractures   . Obesity     Medications:  Scheduled:  . aspirin EC  81 mg Oral Daily  . atorvastatin  80 mg Oral q1800  . furosemide  80 mg Intravenous BID  . glimepiride  1 mg Oral BID  . Influenza vac split quadrivalent PF  0.5 mL Intramuscular Tomorrow-1000  . insulin aspart  0-9 Units Subcutaneous TID WC  . loratadine  10 mg  Oral Daily  . metoprolol tartrate  25 mg Oral BID  . mometasone-formoterol  2 puff Inhalation BID  . pneumococcal 23 valent vaccine  0.5 mL Intramuscular Tomorrow-1000  . sodium chloride flush  3 mL Intravenous Q12H    Assessment: 77 YOM presenting with SOB, in afib in [facility]P, no AC PTA. Consult placed to start heparin infusion; however, plan changed to receive apixaban (last dose on 11/11@1526 ).   No anticoag PTA. In Afib on arrival, started on Eliquis (last dose 11/11 at 1530) but now transitioning to heparin for possible cath. Heparin level may still be falsely elevated at 0.74 and aPTT ok at 93. CBC stable.   Goal of Therapy:  Heparin level 0.3-0.7 units/ml APTT: 66-102 sec Monitor platelets by anticoagulation protocol: Yes   Plan:  Decrease heparin gtt slightly to 1,400 units/hr Monitor daily aPTT / heparin level, CBC, s/s of bleed  Enzo Bi, PharmD, BCPS Clinical Pharmacist Phone number [phone] 03/27/2018  7:49 AM

## 2018-03-27 NOTE — Progress Notes (Addendum)
K 3.1. Cr climbing. Will give x 1 now. Spoke to Dr. Anne Fu who agrees CHF consult would be helpful. Reviewed with Dr. Shirlee Latch who agrees to see. I also updated patient of echo results and plan and spoke with nurse. I also raised concern to Dr. Shirlee Latch that I'm not sure patient has true insight into how sick he is - he seems to minimize his dyspnea. Last night he told nursing he needed to go home and get ready for Thanksgiving but would come back after the holiday. I've had to discuss with him several times that he would require several days of evaluation and management. He's also been noncompliant with urinal per nursing.  Laquasha Groome PA-C

## 2018-03-27 NOTE — Consult Note (Addendum)
Advanced Heart Failure Team Consult Note   Primary Physician: Bradd Canary, MD PCP-Cardiologist:  [name], MD  Reason for Consultation: Acute systolic HF  HPI:    David Huynh is seen today for evaluation of acute systolic HF at the request of Dr Wyline Mood.   David Huynh is a 77 y.o. male with a history of CAD, chronic combined HF, HTN, HLD, DM, CKD 3, and asthma.  No cardiology follow up in >1 year.  He has had progressive increasing SOB over the last 2-3 weeks. He says he had an asthma attack and has felt poorly since then. +cough with yellow sputum, now dry cough. No fever or chills. Denies palpitations. He stopped taking medications 1 week ago because he felt so fatigued and SOB. Denies and CP. Occasional dizziness after ambulating. Poor appetite. He has some BLE edema and orthopnea. He has not been weighing at home. No tobacco or drug use. Rare ETOH use. Lives at home with his wife in [place]. 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 No family hx of CAD or CHF.   Presented to [facility] 03/25/18 with SOB. Found to be in afib RVR. He was started on diltiazem drip and Eliquis.   Pertinent admission labs: Na 137, K 3.7, creatinine 1.51, mag 2.0, BNP 381, troponin 0.05 > 0.04, WBC 9.3, lactic acid 2.72 > 1.69, hemoglobin 14.9, TSH 1.685  CXR: bilateral interstitial densities suggesting mild edema, small right pleural effusion  He has been given 80 mg IV lasix BID with sluggish diuresis (unsure if everything is being recorded). Creatinine trending up, 1.51>1.62>1.72. Weight is unchanged.   He remains in afib 90-120s. SBP 110-140s. On heparin drip (received 1 dose of Eliquis on 11/11). Dilt is off.  PICC line placed with concerns for low ouput. Coox is 66%. CVP 23-24.  He feels less SOB. Denies any CP. Wants to know a timeline and be involved in his plan of care.   Echo 03/26/18: - Left ventricle: The cavity size was normal. Wall thickness was   increased in a pattern of  mild LVH. Systolic function was   moderately reduced. The estimated ejection fraction was in the   range of 35% to 40%. Moderate diffuse hypokinesis with no   identifiable regional variations. Atrial fibrillation lessens the   specificty of the findings, but Doppler parameters are consistent   with restrictive physiology, indicative of decreased left   ventricular diastolic compliance and/or increased left atrial   pressure. Acoustic contrast opacification revealed no evidence   ofthrombus. - Mitral valve: Calcified annulus. There was moderate regurgitation   directed centrally. - Left atrium: The atrium was severely dilated. - Right ventricle: The cavity size was moderately to severely   dilated. Systolic function was moderately to severely reduced. - Right atrium: The atrium was severely dilated. - Pulmonary arteries: Systolic pressure was mildly increased. PA   peak pressure: 38 mm Hg (S).  Echo 07/17/16: EF 55-60%, grade 1 DD  LHC 02/17/16:  Severe 3 vessel disease: mRCA, OM2 branch, D1- but only 1 PCI target  There is moderate left ventricular systolic dysfunction. The left ventricular ejection fraction is 35-45% by visual estimate.  LV end diastolic pressure is mildly elevated - 19 mmHg  Ost 1st Diag to 1st Diag lesion, 90 %stenosed. Ostial Lat 2nd Mrg lesion, 80 %stenosed. - Medical management  Prox LAD to Mid LAD lesion, 30 %stenosed. Dist LAD-1 lesion, 35 %stenosed. Dist LAD-2 lesion, 40 %stenosed.  Mid RCA lesion,  85 %stenosed.  Post intervention, there is a 0% residual stenosis.  A STENT PROMUS PREM MR 3.5X28 drug eluting stent was successfully placed. Although he had three-vessel disease, the only PCI amenable target is the 10 MRCA lesions. These were covered with 1 DES stent. Otherwise he will be treated medically  Echo 02/12/16: EF 35-40%  Review of Systems: [y] = yes,  = no   General: Weight gain [ y]; Weight loss ; Anorexia Cove.Etienne ]; Fatigue Cove.Etienne ]; Fever ;  Chills ; Weakness [ y]  Cardiac: Chest pain/pressure ; Resting SOB ; Exertional SOB Cove.Etienne ]; Orthopnea Cove.Etienne ]; Pedal Edema Cove.Etienne ]; Palpitations ; Syncope ; Presyncope ; Paroxysmal nocturnal dyspnea[ ]   Pulmonary: Cough Cove.Etienne ]; Wheezing[ ] ; Hemoptysis[ ] ; Sputum ; Snoring [ y]  GI: Vomiting[ ] ; Dysphagia[ ] ; Melena[ ] ; Hematochezia ; Heartburn[ ] ; Abdominal pain ; Constipation ; Diarrhea ; BRBPR   GU: Hematuria[ ] ; Dysuria ; Nocturia[ ]   Vascular: Pain in legs with walking ; Pain in feet with lying flat ; Non-healing sores ; Stroke ; TIA ; Slurred speech ;  Neuro: Headaches[ ] ; Vertigo[ ] ; Seizures[ ] ; Paresthesias[ ] ;Blurred vision ; Diplopia ; Vision changes   Ortho/Skin: Arthritis ; Joint pain ; Muscle pain ; Joint swelling ; Back Pain ; Rash   Psych: Depression[ ] ; Anxiety[ ]   Heme: Bleeding problems ; Clotting disorders ; Anemia   Endocrine: Diabetes ; Thyroid dysfunction[ ]   Home Medications Prior to Admission medications   Medication Sig Start Date End Date Taking? Authorizing Provider  acetaminophen (TYLENOL) 500 MG tablet Take 500 mg by mouth every 8 (eight) hours as needed (for pain or headaches).    Yes [provider]  amLODipine (NORVASC) 10 MG tablet Take 1 tablet (10 mg total) by mouth daily. 06/28/16  Yes Marykay Lex, MD  atorvastatin (LIPITOR) 80 MG tablet Take 1 tablet (80 mg total) by mouth daily. 12/26/16 03/25/18 Yes Marykay Lex, MD  carvedilol (COREG) 25 MG tablet Take 1 tablet (25 mg total) by mouth 2 (two) times daily with a meal. 01/09/17  Yes Azalee Course, PA  cholecalciferol (VITAMIN D) 1000 units tablet Take 1,000 Units by mouth daily.   Yes [provider]  clopidogrel (PLAVIX) 75 MG tablet Take 1 tablet (75 mg total) by mouth daily with breakfast. 06/28/16  Yes Marykay Lex, MD  EPINEPHrine (PRIMATENE MIST) 0.125 MG/ACT AERO Inhale 2 puffs into the lungs 5  (five) times daily as needed (for shortness of breath).   Yes [provider]  Fexofenadine HCl (ALLEGRA PO) Take 1 tablet by mouth daily as needed (for allergies).   Yes [provider]  furosemide (LASIX) 40 MG tablet TAKE 1 TABLET IN THE MORNING AND 1/2 TABLET IN THE EVENING Patient taking differently: Take 20-40 mg by mouth See admin instructions. Take 40 mg by mouth in the morning and 20 mg in the evening 04/17/16  Yes Azalee Course, PA  glimepiride (AMARYL) 1 MG tablet Take 1 tablet (1 mg total) by mouth 2 (two) times daily. 01/01/17  Yes Bradd Canary, MD  lisinopril (PRINIVIL,ZESTRIL) 40 MG tablet Take 1 tablet (40 mg total) by mouth daily. 06/28/16  Yes Marykay Lex, MD  metFORMIN (GLUCOPHAGE) 1000 MG tablet Take 1 tablet (1,000 mg total)  by mouth 2 (two) times daily with a meal. 01/09/17  Yes Bradd Canary, MD  nitroGLYCERIN (NITROSTAT) 0.4 MG SL tablet Place 1 tablet (0.4 mg total) under the tongue every 5 (five) minutes as needed for chest pain. 02/18/16  Yes Little Ishikawa, NP  VENTOLIN HFA 108 (90 Base) MCG/ACT inhaler INHALE 2 PUFFS INTO THE LUNGS AS NEEDED Patient not taking: Reported on 03/25/2018 11/30/16   Bradd Canary, MD    Past Medical History: Past Medical History:  Diagnosis Date  . Allergy    seasonal  . Asthma    mild, intermittent  . CAD, multiple vessel    Three-vessel disease involving mid RCA 85% (PCI with DES), OM 2 80% in branch and ostD1 ~90%. Only the mid RCA with PCI target.   . Chronic combined systolic and diastolic CHF (congestive heart failure) (HCC)   . CKD (chronic kidney disease), stage III (HCC)   . Diabetes mellitus    type 2  . Fracture of multiple ribs 02/12/2015  . Hyperlipidemia   . Hypertension   . Hypocalcemia 02/12/2015  . Liver function study, abnormal 04/26/2011  . Multiple rib fractures   . Obesity     Past Surgical History: Past Surgical History:  Procedure Laterality Date  . APPENDECTOMY  1975  . CARDIAC  CATHETERIZATION N/A 02/17/2016   Procedure: Left Heart Cath and Coronary Angiography;  Surgeon: Marykay Lex, MD;  Location: [facility] INVASIVE CV LAB;  Service: Cardiovascular: mRCA 85% (PCI), branch of OM2 80%,  ostD1 90%; EF 35-45%  . CARDIAC CATHETERIZATION N/A 02/17/2016   Procedure: Coronary Stent Intervention;  Surgeon: Marykay Lex, MD;  Location: [facility] INVASIVE CV LAB;  Service: Cardiovascular: mRCA PCI : STENT PROMUS PREM MR 3.5X28 DES  . CATARACT EXTRACTION  2010   b/l  . RETINAL LASER PROCEDURE  1985  . TONSILLECTOMY AND ADENOIDECTOMY  1947  . TRANSTHORACIC ECHOCARDIOGRAM  02/2016   Mild LVH. Moderately reduced EF of 35-40% with diffuse hypokinesis. Indeterminate diastolic function. Likely elevated LA pressures. Mild aortic stenosis (mean gradient 10 mmHg). Mild MR. Mild LA dilation. Mild to moderate PA hypertension with 39 mmHg  . VIDEO ASSISTED THORACOSCOPY (VATS)/THOROCOTOMY Right 01/24/2015   Procedure: VIDEO ASSISTED THORACOSCOPY (VATS) FOR DRAINAGE OF RIGHT HEMOTHORAX;  Surgeon: Alleen Borne, MD;  Location: [facility] OR;  Service: Thoracic;  Laterality: Right;    Family History: Family History  Problem Relation Age of Onset  . CAD Neg Hx     Social History: Social History   Socioeconomic History  . Marital status: Married    Spouse name: Not on file  . Number of children: Not on file  . Years of education: Not on file  . Highest education level: Not on file  Occupational History  . Not on file  Social Needs  . Financial resource strain: Not on file  . Food insecurity:    Worry: Not on file    Inability: Not on file  . Transportation needs:    Medical: Not on file    Non-medical: Not on file  Tobacco Use  . Smoking status: Never Smoker  . Smokeless tobacco: Never Used  Substance and Sexual Activity  . Alcohol use: Yes    Comment: rare use now  . Drug use: No  . Sexual activity: Not on file  Lifestyle  . Physical activity:    Days per week: Not on file    Minutes  per session: Not on file  . Stress:  Not on file  Relationships  . Social connections:    Talks on phone: Not on file    Gets together: Not on file    Attends religious service: Not on file    Active member of club or organization: Not on file    Attends meetings of clubs or organizations: Not on file    Relationship status: Not on file  Other Topics Concern  . Not on file  Social History Narrative  . Not on file    Allergies:  No Known Allergies  Objective:    Vital Signs:   Temp:  [97.2 F (36.2 C)-97.6 F (36.4 C)] 97.6 F (36.4 C) (11/13 0722) Pulse Rate:  [67-121] 117 (11/13 0903) Resp:  [16-18] 18 (11/13 0722) BP: (86-146)/(60-104) 144/91 (11/13 0903) SpO2:  [91 %-100 %] 96 % (11/13 0903) Weight:  [129.5 kg] 129.5 kg (11/13 0459) Last BM Date: 03/26/18  Weight change: Filed Weights   03/25/18 1818 03/26/18 0623 03/27/18 0459  Weight: 129.4 kg 129.5 kg 129.5 kg    Intake/Output:   Intake/Output Summary (Last 24 hours) at 03/27/2018 1003 Last data filed at 03/27/2018 0900 Gross per 24 hour  Intake 1401.11 ml  Output 800 ml  Net 601.11 ml      Physical Exam    General: No resp difficulty HEENT: normal Neck: supple. JVP to jaw. Carotids 2+ bilat; no bruits. No lymphadenopathy or thyromegaly appreciated. Cor: PMI nondisplaced. Tachy, irregular. No rubs, gallops or murmurs. Lungs: distant breath sounds Abdomen: soft, nontender, ++distended. No hepatosplenomegaly. No bruits or masses. Good bowel sounds. Extremities: no cyanosis, clubbing, rash, 2-3+ edema into thighs. Warm.  Neuro: alert & orientedx3, cranial nerves grossly intact. moves all 4 extremities w/o difficulty. Affect pleasant   Telemetry   Afib 100-140s. Personally reviewed.   EKG    EKG 11/11: Afib RVR 161 bpm.   Labs   Basic Metabolic Panel: Recent Labs  Lab 03/25/18 1225 03/25/18 2018 03/26/18 0238 03/27/18 0457  NA 137  --  136 137  K 3.7  --  3.7 3.1*  CL 101  --  102 102    CO2 23  --  22 26  GLUCOSE 262*  --  234* 134*  BUN 25*  --  25* 31*  CREATININE 1.51*  --  1.62* 1.72*  CALCIUM 9.2  --  8.7* 8.4*  MG  --  2.0  --   --     Liver Function Tests: Recent Labs  Lab 03/25/18 1225  AST 27  ALT 25  ALKPHOS 70  BILITOT 2.0*  PROT 7.6  ALBUMIN 4.1   No results for input(s): LIPASE, AMYLASE in the last 168 hours. No results for input(s): AMMONIA in the last 168 hours.  CBC: Recent Labs  Lab 03/25/18 1225 03/26/18 0714 03/27/18 0457  WBC 9.3 9.2 8.8  NEUTROABS 6.8  --   --   HGB 14.9 14.0 13.2  HCT 47.0 45.8 41.7  MCV 91.4 91.6 91.6  PLT 254 249 223    Cardiac Enzymes: Recent Labs  Lab 03/25/18 1225 03/25/18 2018 03/26/18 0238 03/26/18 0714  TROPONINI 0.05* 0.04* 0.04* 0.03*    BNP: BNP (last 3 results) Recent Labs    03/25/18 1225  BNP 381.9*    ProBNP (last 3 results) No results for input(s): PROBNP in the last 8760 hours.   CBG: Recent Labs  Lab 03/26/18 0739 03/26/18 1118 03/26/18 1625 03/26/18 2145 03/27/18 0721  GLUCAP 232* 176* 184* 172* 128*  Coagulation Studies: No results for input(s): LABPROT, INR in the last 72 hours.   Imaging   Dg Chest Port 1 View  Result Date: 03/26/2018 CLINICAL DATA:  PICC line placement. EXAM: PORTABLE CHEST 1 VIEW COMPARISON:  Radiographs of March 25, 2018. FINDINGS: Stable cardiomegaly. No pneumothorax is noted. Interval placement of right-sided PICC line with distal tip in expected position of the SVC. Left lung is clear. Increased right basilar atelectasis or infiltrate is noted with associated pleural effusion. Bony thorax is unremarkable. IMPRESSION: Interval placement of right-sided PICC line with tip in expected position of the SVC. Increased right basilar atelectasis or infiltrate with associated pleural effusion. Electronically Signed   By: Lupita Raider, M.D.   On: 03/26/2018 21:41   [place] Ekg Site Rite  Result Date: 03/26/2018 If Site Rite image not  attached, placement could not be confirmed due to current cardiac rhythm.     Medications:     Current Medications: . aspirin EC  81 mg Oral Daily  . atorvastatin  80 mg Oral q1800  . furosemide  80 mg Intravenous BID  . glimepiride  1 mg Oral BID  . Influenza vac split quadrivalent PF  0.5 mL Intramuscular Tomorrow-1000  . insulin aspart  0-9 Units Subcutaneous TID WC  . loratadine  10 mg Oral Daily  . metoprolol tartrate  25 mg Oral BID  . mometasone-formoterol  2 puff Inhalation BID  . pneumococcal 23 valent vaccine  0.5 mL Intramuscular Tomorrow-1000  . sodium chloride flush  3 mL Intravenous Q12H     Infusions: . sodium chloride    . heparin 1,400 Units/hr (03/27/18 0849)       Patient Profile   David Huynh is a 77 y.o. male with a history of CAD, chronic combined HF, HTN, HLD, DM, CKD 3, and asthma.  He presented to [facility] with SOB. Found to be in afib RVR. Repeat echo showed reduced EF to 35-40% (prior echo EF 55-60%).  Assessment/Plan   1. Acute systolic HF. Unclear etiology. Previously low EF due to ICM, but improved after PCI.  - Echo 02/12/16: EF 35-40%. He had PCI in 02/2016. Echo 07/17/16: EF 55-60%, grade 1 DD. Echo 03/26/18 showed reduced EF to 35-40%. Could be secondary to afib RVR +/- ?new blockage  - Volume markedly overloaded. Weight is up at least 10 lbs. - Sluggish diuresis with 80 mg IV lasix BID. Weights unchanged. Creatinine trending up 1.5 > 1.7. CVP 23-24 on my check. - Currently on lasix 80 mg IV BID with sluggish UOP. Increase to 80 mg IV TID. May need lasix drip. Anticipate switching to torsemide once ready for PO.  - PICC line placed. Coox normal 64%, but he seems to be low output in setting of afib with fatigue, anorexia, sluggish diuresis, and worsening creatinine.  - At home, he is on coreg and lisinopril.  - Would like to eventually add spiro and Entresto. Will hold off for now with creat 1.7 and ?need for LHC.  - May need  repeat R/LHC  2. New Afib RVR - HR 100-140s. Probably contributing to lower EF and ?low output - Would not use diltiazem with low EF. - Hold BB for now with concerns for low output - Start amio drip with bolus.  - Can probably switch heparin drip to Eliquis. Will discuss with Dr Shirlee Latch. - Will likely need TEE/DCCV.   3. CKD 3 - Monitor BMET closely with diuresis - Creatinine trending up slowly 1.5 >  1.6 > 1.7  4. CAD with PCI 2017 to mRCA - Continue ASA, statin. If he starts Eliquis and CAD is stable, can come off ASA. - Troponin trend flat. - Denies any CP. - May need repeat LHC with now lower EF. Creatinine too high currently at 1.7  5. DM - Consider jardiance or forsega   6. HTN - SBP 110-140s.   7. Abdominal distension - Make need to check [place] for ascites - AST/ALT normal on admit  8. Hypokalemia - K 3.1. Give additional supp   Medication concerns reviewed with patient and pharmacy team. Barriers identified: none at this time.   Length of Stay: 1  Alford Highland, NP  03/27/2018, 10:03 AM  Advanced Heart Failure Team Pager [phone] (M-F; 7a - 4p)  Please contact [facility] for night-coverage after hours (4p -7a ) and weekends on [email or web address]  Patient seen with NP, agree with the above note.   Patient had EF 35-40% in 9/17, had cath with DES to RCA then repeat echo with EF up to 55-60% by 3/18.  No cardiology followup for about a year.  About 3 wks ago, developed dyspnea and thought he was having an asthma flare.  No chest pain.  He started taking Primatene (inhaled epinephrine).  Finally dyspnea became so bad that he went to ER, found to be in atrial fibrillation with RVR.  Echo showed EF back down to 35-40% with moderate-severely dilated RV and moderate-severe RV systolic dysfunction.  HR remains elevated in 110s-120s, now off diltiazem gtt.  CVP > 20 with co-ox 66%.  He has not diuresed well, creatinine up to 1.7.    On exam, JVP 16 cm, clear lungs, irregular  S1S2, 1+ edema to thighs.  Mild abdominal distention but soft.   1. Acute systolic CHF: Prior ischemic cardiomyopathy, improved after PCI.  Echo this admission with EF back down to 35-40% and moderate-severe RV dysfunction.  He has had no chest pain, only dyspnea.  TnI only slightly elevated around 0.04 with no trend.  Creatinine up to 1.7.  Cannot rule out worsening coronary disease, but do not think acute or recent ACS.  It is possible that this is a tachycardia-mediated CMP (also has significant RV dysfunction), not sure how long he has been in atrial fibrillation as he does not feel palpitations.  On exam, he is volume overloaded with CVP > 20.  Co-ox preserved at 66%.  - Control HR with IV amiodarone.  - He got Lasix 80 mg IV this morning, will start Lasix gtt 12 mg/hr.  - I think we need to get him out of atrial fibrillation. Will plan TEE-guided DCCV after 5 doses of Eliquis (see below).  - He will need catheterization eventually.  As above, I doubt he has had recent ACS.  Therefore, I do not think cath is an urgency. As creatinine is up to 1.7 and we do not know if it will continue to rise, plan to go ahead and cardiovert him.  Can then do right/left heart cath a month or so post-DCCV when it is relatively safe to pause anticoagulation for a couple of days.  - Add spironolactone 12.5 mg daily.  - No ACEi/ARB/Entresto yet with elevated creatinine.  2. Atrial fibrillation: Noted for the first time this admission.  Not sure how long he has been in afib as he does not feel palpitations. Has been short of breath for around 3 wks.  As above, concerned for possible tachy-mediated CMP.   -  Start amiodarone gtt for rate control (HR still high).  - Eliquis 5 mg bid.  - After 5th dose of Eliquis (Friday), will plan TEE-DCCV. - In future, atrial fibrillation ablation would be consideration.  3. AKI: ?Baseline.  Creatinine up from 1.5 since admitted.  Follow closely with diuresis, creatinine may improve  with fall in renal venous pressure.   4. CAD: History of DES to RCA in 9/17.  This admission, TnI mildly elevated at 0.04 with no trend => suspect demand ischemia with volume overload.  As above, doubt ACS but cannot rule out worsening of CAD over the last couple of years.   - Can stop ASA with Eliquis use.  - Continue statin.  - As above, should have cath eventually given fall in EF.  However, with rise in creatinine and no chest pain, will hold off acutely. Plan to wait 1 month post-DCCV then can hold anticoagulation for [facility]   [name] 03/27/2018 12:47 PM

## 2018-03-27 NOTE — Care Management (Signed)
#    5.   S/W CHRISTINE @ [facility] #           [phone]   ELIQUIS  5 MG BID  COVER- YES CO-PAY-$ 30.00 TIER-  NO PRIROA PPROVAL- NO  PREFERRED PHARMACY : YES [facility]

## 2018-03-28 LAB — COOXEMETRY PANEL
Carboxyhemoglobin: 1.3 % (ref 0.5–1.5)
Carboxyhemoglobin: 1.3 % (ref 0.5–1.5)
METHEMOGLOBIN: 0.9 % (ref 0.0–1.5)
Methemoglobin: 1.1 % (ref 0.0–1.5)
O2 Saturation: 46.7 %
O2 Saturation: 45.8 %
TOTAL HEMOGLOBIN: 13.9 g/dL (ref 12.0–16.0)
TOTAL HEMOGLOBIN: 12.8 g/dL (ref 12.0–16.0)

## 2018-03-28 LAB — BASIC METABOLIC PANEL
Anion gap: 12 (ref 5–15)
BUN: 34 mg/dL — AB (ref 8–23)
CHLORIDE: 99 mmol/L (ref 98–111)
CO2: 25 mmol/L (ref 22–32)
Calcium: 8.6 mg/dL — ABNORMAL LOW (ref 8.9–10.3)
Creatinine, Ser: 1.85 mg/dL — ABNORMAL HIGH (ref 0.61–1.24)
GFR calc Af Amer: 39 mL/min — ABNORMAL LOW (ref >60)
GFR calc non Af Amer: 34 mL/min — ABNORMAL LOW (ref >60)
GLUCOSE: 220 mg/dL — AB (ref 70–99)
POTASSIUM: 3.6 mmol/L (ref 3.5–5.1)
Sodium: 136 mmol/L (ref 135–145)

## 2018-03-28 LAB — HEPARIN LEVEL (UNFRACTIONATED)

## 2018-03-28 LAB — CBC
HEMATOCRIT: 43.2 % (ref 39.0–52.0)
HEMOGLOBIN: 13.5 g/dL (ref 13.0–17.0)
MCH: 29.2 pg (ref 26.0–34.0)
MCHC: 31.3 g/dL (ref 30.0–36.0)
MCV: 93.5 fL (ref 80.0–100.0)
Platelets: 226 10*3/uL (ref 150–400)
RBC: 4.62 MIL/uL (ref 4.22–5.81)
RDW: 13 % (ref 11.5–15.5)
WBC: 9 10*3/uL (ref 4.0–10.5)
nRBC: 0 % (ref 0.0–0.2)

## 2018-03-28 LAB — MAGNESIUM: MAGNESIUM: 2.3 mg/dL (ref 1.7–2.4)

## 2018-03-28 LAB — APTT: aPTT: 41 seconds — ABNORMAL HIGH (ref 24–36)

## 2018-03-28 LAB — GLUCOSE, CAPILLARY
GLUCOSE-CAPILLARY: 119 mg/dL — AB (ref 70–99)
GLUCOSE-CAPILLARY: 171 mg/dL — AB (ref 70–99)
Glucose-Capillary: 136 mg/dL — ABNORMAL HIGH (ref 70–99)
Glucose-Capillary: 168 mg/dL — ABNORMAL HIGH (ref 70–99)

## 2018-03-28 MED ORDER — SPIRONOLACTONE 25 MG PO TABS
25.0000 mg | ORAL_TABLET | Freq: Every day | ORAL | Status: DC
  Administered 2018-03-28 – 2018-04-04 (×8): 25 mg via ORAL
  Filled 2018-03-28 (×8): qty 1

## 2018-03-28 MED ORDER — POTASSIUM CHLORIDE CRYS ER 20 MEQ PO TBCR
40.0000 meq | EXTENDED_RELEASE_TABLET | Freq: Once | ORAL | Status: AC
  Administered 2018-03-28: 40 meq via ORAL

## 2018-03-28 MED ORDER — METOLAZONE 5 MG PO TABS
2.5000 mg | ORAL_TABLET | Freq: Once | ORAL | Status: AC
  Administered 2018-03-28: 2.5 mg via ORAL
  Filled 2018-03-28: qty 1

## 2018-03-28 MED ORDER — DIGOXIN 125 MCG PO TABS
0.1250 mg | ORAL_TABLET | Freq: Every day | ORAL | Status: DC
  Administered 2018-03-28 – 2018-04-04 (×8): 0.125 mg via ORAL
  Filled 2018-03-28 (×9): qty 1

## 2018-03-28 NOTE — Progress Notes (Signed)
Received consult to assess PICC site. Gauze noted under transparent dressing. Site clean dry and intact. Discussed with Shanda Bumps, Charge RN: dressing should be changed after 48hours. Do not reapply gauze if no bleeding. Place new biopatch and securement device.  Please contact IV Team with any questions.

## 2018-03-28 NOTE — Progress Notes (Addendum)
  Advanced Heart Failure Rounding Note  PCP-Cardiologist: David Harding, MD   Subjective:    Started on amio drip and eliquis yesterday. Remains in Afib 100-120s.   Started on spiro yesterday.  Started on lasix drip @ 12 mg/hr with improvement in UOP. I/O neg only -550 mls. Weight down 4 lbs. Coox today 46%. Creatinine trending up 1.7 > 1.8. CVP 20-21.  Lasix drip increase to 15 mg/hr this am and ordered for metolazone.  SBP 110-130s. Repeat coox pending.  Feels okay. Denies SOB, but looks uncomfortable. Up all night urinating. Poor appetite.  Objective:   Weight Range: 127.8 kg Body mass index is 38.21 kg/m.   Vital Signs:   Temp:  [97.4 F (36.3 C)-98.1 F (36.7 C)] 97.7 F (36.5 C) (11/14 0754) Pulse Rate:  [87-122] 115 (11/14 0754) BP: (104-139)/(75-113) 124/87 (11/14 0754) SpO2:  [91 %-99 %] 97 % (11/14 0819) Weight:  [127.8 kg] 127.8 kg (11/14 0403) Last BM Date: 03/27/18  Weight change: Filed Weights   03/26/18 0623 03/27/18 0459 03/28/18 0403  Weight: 129.5 kg 129.5 kg 127.8 kg    Intake/Output:   Intake/Output Summary (Last 24 hours) at 03/28/2018 1015 Last data filed at 03/28/2018 0700 Gross per 24 hour  Intake 1387.79 ml  Output 2300 ml  Net -912.21 ml      Physical Exam    General:  No resp difficulty HEENT: Normal Neck: Supple. JVP to jaw. Carotids 2+ bilat; no bruits. No lymphadenopathy or thyromegaly appreciated. Cor: PMI nondisplaced. Tachy, irregular. No rubs, gallops or murmurs. Lungs: Clear Abdomen: Soft, nontender, ++distended, but soft. No hepatosplenomegaly. No bruits or masses. Good bowel sounds. Extremities: No cyanosis, clubbing, rash, BLE 1+ edema into thighs.  Neuro: Alert & orientedx3, cranial nerves grossly intact. moves all 4 extremities w/o difficulty. Affect pleasant   Telemetry   Afib 100-120s. Personally reviewed.   EKG    No new tracings.   Labs    CBC Recent Labs    03/25/18 1225  03/27/18 0457  03/28/18 0452  WBC 9.3   < > 8.8 9.0  NEUTROABS 6.8  --   --   --   HGB 14.9   < > 13.2 13.5  HCT 47.0   < > 41.7 43.2  MCV 91.4   < > 91.6 93.5  PLT 254   < > 223 226   < > = values in this interval not displayed.   Basic Metabolic Panel Recent Labs    03/27/18 0557 03/27/18 1556 03/28/18 0452  NA  --  136 136  K  --  4.2 3.6  CL  --  102 99  CO2  --  23 25  GLUCOSE  --  198* 220*  BUN  --  37* 34*  CREATININE  --  1.93* 1.85*  CALCIUM  --  8.8* 8.6*  MG 2.2  --  2.3   Liver Function Tests Recent Labs    03/25/18 1225  AST 27  ALT 25  ALKPHOS 70  BILITOT 2.0*  PROT 7.6  ALBUMIN 4.1   No results for input(s): LIPASE, AMYLASE in the last 72 hours. Cardiac Enzymes Recent Labs    03/25/18 2018 03/26/18 0238 03/26/18 0714  TROPONINI 0.04* 0.04* 0.03*    BNP: BNP (last 3 results) Recent Labs    03/25/18 1225  BNP 381.9*    ProBNP (last 3 results) No results for input(s): PROBNP in the last 8760 hours.   D-Dimer No results for input(s): DDIMER   in the last 72 hours. Hemoglobin A1C No results for input(s): HGBA1C in the last 72 hours. Fasting Lipid Panel No results for input(s): CHOL, HDL, LDLCALC, TRIG, CHOLHDL, LDLDIRECT in the last 72 hours. Thyroid Function Tests Recent Labs    03/25/18 2018  TSH 1.685    Other results:   Imaging     No results found.   Medications:     Scheduled Medications: . apixaban  5 mg Oral BID  . atorvastatin  80 mg Oral q1800  . glimepiride  1 mg Oral BID  . Influenza vac split quadrivalent PF  0.5 mL Intramuscular Tomorrow-1000  . insulin aspart  0-9 Units Subcutaneous TID WC  . loratadine  10 mg Oral Daily  . metolazone  2.5 mg Oral Once  . mometasone-formoterol  2 puff Inhalation BID  . pneumococcal 23 valent vaccine  0.5 mL Intramuscular Tomorrow-1000  . potassium chloride  40 mEq Oral Daily  . sodium chloride flush  3 mL Intravenous Q12H  . spironolactone  25 mg Oral Daily     Infusions: .  sodium chloride    . amiodarone 30 mg/hr (03/28/18 0130)  . furosemide (LASIX) infusion 15 mg/hr (03/28/18 0917)     PRN Medications:  sodium chloride, acetaminophen, levalbuterol, nitroGLYCERIN, ondansetron (ZOFRAN) IV, sodium chloride flush    Patient Profile   David Huynh is a 77 y.o. male with a history of CAD, chronic combined HF, HTN, HLD, DM, CKD 3, and asthma.  He presented to [facility] with SOB. Found to be in afib RVR. Repeat echo showed reduced EF to 35-40% (prior echo EF 55-60%).  Assessment/Plan   1. Acute systolic HF. Unclear etiology. Previously low EF due to ICM, but improved after PCI.  - Echo 02/12/16: EF 35-40%. He had PCI in 02/2016. Echo 07/17/16: EF 55-60%, grade 1 DD. Echo 03/26/18 showed reduced EF to 35-40%. Could be secondary to afib RVR +/- ?new blockage  - Volume overloaded. CVP 20-21. Coox 47%. Recheck pending.  - Continue lasix drip 15 mg/hr. Ordered for metolazone 2.5 mg x1 today. Creatinine trending up 1.7 > 1.8. Anticipate switching to torsemide once ready for PO.  - Increase spiro to 25 mg daily - Would like to add Entresto eventually. Will hold off for now with worsening creatinine.  - May need repeat R/LHC  2. New Afib RVR - HR 100-140s. Probably contributing to lower EF and ?low output - Would not use diltiazem with low EF. - Hold BB for now with concerns for low output - Continue amio drip. Now on Eliquis. - Plan for TEE/DCCV Friday afternoon after 5th dose Eliquis.   3. AKI on CKD 3 - Monitor BMET closely with diuresis - Creatinine trending up slowly 1.5 > 1.6 > 1.7 > 1.85  4. CAD with PCI 2017 to mRCA - Continue ASA, statin. If he starts Eliquis and CAD is stable, can come off ASA. - Troponin trend flat. - No s/s ischemia. - May need repeat LHC with now lower EF. Creatinine too high currently at 1.85. Would time about a month after DCCV.   5. DM - Consider jardiance or forsega. No change.   6. HTN - SBP  110-130s  7. Abdominal distension - Make need to check US for ascites. No change. - AST/ALT normal on admit  8. Hypokalemia - K 3.6. Continue supp. Increased spiro as above.   Medication concerns reviewed with patient and pharmacy team. Barriers identified: none at this time.      Length of Stay: 2  Ashley M Smith, NP  03/28/2018, 10:15 AM  Advanced Heart Failure Team Pager [phone] (M-F; 7a - 4p)  Please contact [facility] for night-coverage after hours (4p -7a ) and weekends on [email or web address]  Patient seen with NP, agree with the above note.   HR remains elevated 110s-120s in atrial fibrillation on amiodarone gtt.   Weight is down with diuresis, not sure I/Os are all recorded.  Creatinine slightly higher at 1.8.  CVP still elevated > 20.  He says that he feels ok.   On exam, JVP 16 cm, clear lungs, irregular S1S2, 1+ edema to thighs.  Mild abdominal distention but soft.   1. Acute systolic CHF: Prior ischemic cardiomyopathy, improved after PCI.  Echo this admission with EF back down to 35-40% and moderate-severe RV dysfunction.  He has had no chest pain, only dyspnea.  TnI only slightly elevated around 0.04 with no trend.  Creatinine up to 1.7.  Cannot rule out worsening coronary disease, but do not think acute or recent ACS.  It is possible that this is a tachycardia-mediated CMP (also has significant RV dysfunction), not sure how long he has been in atrial fibrillation as he does not feel palpitations.  On exam, he is volume overloaded with CVP > 20.  Co-ox lower today at 46%.  - Would avoid milrinone at this point with rapid atrial fibrillation.  Creatinine fairly stable, so will start digoxin.  May have to stop if creatinine continues to rise.   - Control HR with IV amiodarone.  - Increase Lasix gtt to 15 mg/hr and will give a dose of metolazone 2.5 x 1.   - I think we need to get him out of atrial fibrillation. Will plan TEE-guided DCCV after 5 doses of Eliquis (see  below).  - He will need catheterization eventually.  As above, I doubt he has had recent ACS.  Therefore, I do not think cath is an urgency. As creatinine is up to 1.8 and we do not know if it will continue to rise, plan to go ahead and cardiovert him.  Can then do right/left heart cath a month or so post-DCCV when it is relatively safe to pause anticoagulation for a couple of days.  - Can increase spironolactone to 25 mg daily.  - No ACEi/ARB/Entresto yet with elevated creatinine.  2. Atrial fibrillation: Noted for the first time this admission.  Not sure how long he has been in afib as he does not feel palpitations. Has been short of breath for around 3 wks.  As above, concerned for possible tachy-mediated CMP.   - Continue amiodarone gtt for rate control (HR still high).  - Eliquis 5 mg bid.  - After 5th dose of Eliquis (Friday), will plan TEE-DCCV. - In future, atrial fibrillation ablation would be consideration.  3. AKI: ?Baseline.  Creatinine up from 1.5 since admitted.  Follow closely with diuresis, creatinine may improve with fall in renal venous pressure.  CVP very high, need to push diuresis with RV failure.  4. CAD: History of DES to RCA in 9/17.  This admission, TnI mildly elevated at 0.04 with no trend => suspect demand ischemia with volume overload.  As above, doubt ACS but cannot rule out worsening of CAD over the last couple of years.   - Can stop ASA with Eliquis use.  - Continue statin.  - As above, should have cath eventually given fall in EF.  However, with rise in creatinine and no   chest pain, will hold off acutely. Plan to wait 1 month post-DCCV then can hold anticoagulation for R/LHC.     03/28/2018 2:51 PM   

## 2018-03-28 NOTE — H&P (View-Only) (Signed)
Advanced Heart Failure Rounding Note  PCP-Cardiologist: [name], MD   Subjective:    Started on amio drip and eliquis yesterday. Remains in Afib 100-120s.   Started on spiro yesterday.  Started on lasix drip @ 12 mg/hr with improvement in UOP. I/O neg only -550 mls. Weight down 4 lbs. Coox today 46%. Creatinine trending up 1.7 > 1.8. CVP 20-21.  Lasix drip increase to 15 mg/hr this am and ordered for metolazone.  SBP 110-130s. Repeat coox pending.  Feels okay. Denies SOB, but looks uncomfortable. Up all night urinating. Poor appetite.  Objective:   Weight Range: 127.8 kg Body mass index is 38.21 kg/m.   Vital Signs:   Temp:  [97.4 F (36.3 C)-98.1 F (36.7 C)] 97.7 F (36.5 C) (11/14 0754) Pulse Rate:  [87-122] 115 (11/14 0754) BP: (104-139)/(75-113) 124/87 (11/14 0754) SpO2:  [91 %-99 %] 97 % (11/14 0819) Weight:  [127.8 kg] 127.8 kg (11/14 0403) Last BM Date: 03/27/18  Weight change: Filed Weights   03/26/18 0623 03/27/18 0459 03/28/18 0403  Weight: 129.5 kg 129.5 kg 127.8 kg    Intake/Output:   Intake/Output Summary (Last 24 hours) at 03/28/2018 1015 Last data filed at 03/28/2018 0700 Gross per 24 hour  Intake 1387.79 ml  Output 2300 ml  Net -912.21 ml      Physical Exam    General:  No resp difficulty HEENT: Normal Neck: Supple. JVP to jaw. Carotids 2+ bilat; no bruits. No lymphadenopathy or thyromegaly appreciated. Cor: PMI nondisplaced. Tachy, irregular. No rubs, gallops or murmurs. Lungs: Clear Abdomen: Soft, nontender, ++distended, but soft. No hepatosplenomegaly. No bruits or masses. Good bowel sounds. Extremities: No cyanosis, clubbing, rash, BLE 1+ edema into thighs.  Neuro: Alert & orientedx3, cranial nerves grossly intact. moves all 4 extremities w/o difficulty. Affect pleasant   Telemetry   Afib 100-120s. Personally reviewed.   EKG    No new tracings.   Labs    CBC Recent Labs    03/25/18 1225  03/27/18 0457  03/28/18 0452  WBC 9.3   < > 8.8 9.0  NEUTROABS 6.8  --   --   --   HGB 14.9   < > 13.2 13.5  HCT 47.0   < > 41.7 43.2  MCV 91.4   < > 91.6 93.5  PLT 254   < > 223 226   < > = values in this interval not displayed.   Basic Metabolic Panel Recent Labs    56/21/30 0557 03/27/18 1556 03/28/18 0452  NA  --  136 136  K  --  4.2 3.6  CL  --  102 99  CO2  --  23 25  GLUCOSE  --  198* 220*  BUN  --  37* 34*  CREATININE  --  1.93* 1.85*  CALCIUM  --  8.8* 8.6*  MG 2.2  --  2.3   Liver Function Tests Recent Labs    03/25/18 1225  AST 27  ALT 25  ALKPHOS 70  BILITOT 2.0*  PROT 7.6  ALBUMIN 4.1   No results for input(s): LIPASE, AMYLASE in the last 72 hours. Cardiac Enzymes Recent Labs    03/25/18 2018 03/26/18 0238 03/26/18 0714  TROPONINI 0.04* 0.04* 0.03*    BNP: BNP (last 3 results) Recent Labs    03/25/18 1225  BNP 381.9*    ProBNP (last 3 results) No results for input(s): PROBNP in the last 8760 hours.   D-Dimer No results for input(s): DDIMER  in the last 72 hours. Hemoglobin A1C No results for input(s): HGBA1C in the last 72 hours. Fasting Lipid Panel No results for input(s): CHOL, HDL, LDLCALC, TRIG, CHOLHDL, LDLDIRECT in the last 72 hours. Thyroid Function Tests Recent Labs    03/25/18 2018  TSH 1.685    Other results:   Imaging     No results found.   Medications:     Scheduled Medications: . apixaban  5 mg Oral BID  . atorvastatin  80 mg Oral q1800  . glimepiride  1 mg Oral BID  . Influenza vac split quadrivalent PF  0.5 mL Intramuscular Tomorrow-1000  . insulin aspart  0-9 Units Subcutaneous TID WC  . loratadine  10 mg Oral Daily  . metolazone  2.5 mg Oral Once  . mometasone-formoterol  2 puff Inhalation BID  . pneumococcal 23 valent vaccine  0.5 mL Intramuscular Tomorrow-1000  . potassium chloride  40 mEq Oral Daily  . sodium chloride flush  3 mL Intravenous Q12H  . spironolactone  25 mg Oral Daily     Infusions: .  sodium chloride    . amiodarone 30 mg/hr (03/28/18 0130)  . furosemide (LASIX) infusion 15 mg/hr (03/28/18 0917)     PRN Medications:  sodium chloride, acetaminophen, levalbuterol, nitroGLYCERIN, ondansetron (ZOFRAN) IV, sodium chloride flush    Patient Profile   David Huynh is a 77 y.o. male with a history of CAD, chronic combined HF, HTN, HLD, DM, CKD 3, and asthma.  He presented to [facility] with SOB. Found to be in afib RVR. Repeat echo showed reduced EF to 35-40% (prior echo EF 55-60%).  Assessment/Plan   1. Acute systolic HF. Unclear etiology. Previously low EF due to ICM, but improved after PCI.  - Echo 02/12/16: EF 35-40%. He had PCI in 02/2016. Echo 07/17/16: EF 55-60%, grade 1 DD. Echo 03/26/18 showed reduced EF to 35-40%. Could be secondary to afib RVR +/- ?new blockage  - Volume overloaded. CVP 20-21. Coox 47%. Recheck pending.  - Continue lasix drip 15 mg/hr. Ordered for metolazone 2.5 mg x1 today. Creatinine trending up 1.7 > 1.8. Anticipate switching to torsemide once ready for PO.  - Increase spiro to 25 mg daily - Would like to add Entresto eventually. Will hold off for now with worsening creatinine.  - May need repeat R/LHC  2. New Afib RVR - HR 100-140s. Probably contributing to lower EF and ?low output - Would not use diltiazem with low EF. - Hold BB for now with concerns for low output - Continue amio drip. Now on Eliquis. - Plan for TEE/DCCV Friday afternoon after 5th dose Eliquis.   3. AKI on CKD 3 - Monitor BMET closely with diuresis - Creatinine trending up slowly 1.5 > 1.6 > 1.7 > 1.85  4. CAD with PCI 2017 to mRCA - Continue ASA, statin. If he starts Eliquis and CAD is stable, can come off ASA. - Troponin trend flat. - No s/s ischemia. - May need repeat LHC with now lower EF. Creatinine too high currently at 1.85. Would time about a month after DCCV.   5. DM - Consider jardiance or forsega. No change.   6. HTN - SBP  110-130s  7. Abdominal distension - Make need to check [place] for ascites. No change. - AST/ALT normal on admit  8. Hypokalemia - K 3.6. Continue supp. Increased spiro as above.   Medication concerns reviewed with patient and pharmacy team. Barriers identified: none at this time.  Length of Stay: 2  Alford Highland, NP  03/28/2018, 10:15 AM  Advanced Heart Failure Team Pager [phone] (M-F; 7a - 4p)  Please contact [facility] for night-coverage after hours (4p -7a ) and weekends on [email or web address]  Patient seen with NP, agree with the above note.   HR remains elevated 110s-120s in atrial fibrillation on amiodarone gtt.   Weight is down with diuresis, not sure I/Os are all recorded.  Creatinine slightly higher at 1.8.  CVP still elevated > 20.  He says that he feels ok.   On exam, JVP 16 cm, clear lungs, irregular S1S2, 1+ edema to thighs.  Mild abdominal distention but soft.   1. Acute systolic CHF: Prior ischemic cardiomyopathy, improved after PCI.  Echo this admission with EF back down to 35-40% and moderate-severe RV dysfunction.  He has had no chest pain, only dyspnea.  TnI only slightly elevated around 0.04 with no trend.  Creatinine up to 1.7.  Cannot rule out worsening coronary disease, but do not think acute or recent ACS.  It is possible that this is a tachycardia-mediated CMP (also has significant RV dysfunction), not sure how long he has been in atrial fibrillation as he does not feel palpitations.  On exam, he is volume overloaded with CVP > 20.  Co-ox lower today at 46%.  - Would avoid milrinone at this point with rapid atrial fibrillation.  Creatinine fairly stable, so will start digoxin.  May have to stop if creatinine continues to rise.   - Control HR with IV amiodarone.  - Increase Lasix gtt to 15 mg/hr and will give a dose of metolazone 2.5 x 1.   - I think we need to get him out of atrial fibrillation. Will plan TEE-guided DCCV after 5 doses of Eliquis (see  below).  - He will need catheterization eventually.  As above, I doubt he has had recent ACS.  Therefore, I do not think cath is an urgency. As creatinine is up to 1.8 and we do not know if it will continue to rise, plan to go ahead and cardiovert him.  Can then do right/left heart cath a month or so post-DCCV when it is relatively safe to pause anticoagulation for a couple of days.  - Can increase spironolactone to 25 mg daily.  - No ACEi/ARB/Entresto yet with elevated creatinine.  2. Atrial fibrillation: Noted for the first time this admission.  Not sure how long he has been in afib as he does not feel palpitations. Has been short of breath for around 3 wks.  As above, concerned for possible tachy-mediated CMP.   - Continue amiodarone gtt for rate control (HR still high).  - Eliquis 5 mg bid.  - After 5th dose of Eliquis (Friday), will plan TEE-DCCV. - In future, atrial fibrillation ablation would be consideration.  3. AKI: ?Baseline.  Creatinine up from 1.5 since admitted.  Follow closely with diuresis, creatinine may improve with fall in renal venous pressure.  CVP very high, need to push diuresis with RV failure.  4. CAD: History of DES to RCA in 9/17.  This admission, TnI mildly elevated at 0.04 with no trend => suspect demand ischemia with volume overload.  As above, doubt ACS but cannot rule out worsening of CAD over the last couple of years.   - Can stop ASA with Eliquis use.  - Continue statin.  - As above, should have cath eventually given fall in EF.  However, with rise in creatinine and no  chest pain, will hold off acutely. Plan to wait 1 month post-DCCV then can hold anticoagulation for [facility].   [name] 03/28/2018 2:51 PM

## 2018-03-28 NOTE — Plan of Care (Signed)
Cardiac Monitor. Plan TEE and CV tomorrow

## 2018-03-28 NOTE — Care Management Note (Addendum)
Case Management Note  Patient Details  Name: David Huynh MRN: [id number] Date of Birth: Apr 01, 1941  Subjective/Objective: Pt presented for Atrial Fib- remains on IV Amio gtt and IV Lasix gtt-has PICC. PTA Independent from home with support of wife. Benefits Check completed for Eliquis-pt will need Eliquis 30 day free card before transitions home if the medications are not sent [facility].              Action/Plan: Per patient he will not need HH services @ this time. CM will continue to monitor for additional needs.   Expected Discharge Date:                Expected Discharge Plan:  Home/Self Care  In-House Referral:  NA  Discharge planning Services  CM Consult, Medication Assistance  Post Acute Care Choice:  NA Choice offered to:  NA  DME Arranged:  N/A DME Agency:  NA  HH Arranged:  NA HH Agency:  NA  Status of Service:  Completed, signed off  If discussed at Long Length of Stay Meetings, dates discussed:    Additional Comments: 1551 04-02-18 Tomi Bamberger, RN,BSN [phone] CM did provide patient with 30 day free Eliquis card 04-01-18. CM will continue to monitor for additional disposition needs.  Gala Lewandowsky, RN 03/28/2018, 3:23 PM

## 2018-03-28 NOTE — Progress Notes (Signed)
Dressing changed 03/28/18 @ 2251 per IV team's orders.

## 2018-03-29 ENCOUNTER — Inpatient Hospital Stay (HOSPITAL_COMMUNITY): Payer: Medicare Other | Admitting: Certified Registered"

## 2018-03-29 ENCOUNTER — Encounter (HOSPITAL_COMMUNITY)
Admission: EM | Disposition: A | Discharge status: Home / Self Care | Payer: Self-pay | Source: Home / Self Care | Attending: Cardiology

## 2018-03-29 ENCOUNTER — Inpatient Hospital Stay (HOSPITAL_COMMUNITY): Payer: Medicare Other

## 2018-03-29 ENCOUNTER — Encounter (HOSPITAL_COMMUNITY): Payer: Self-pay

## 2018-03-29 DIAGNOSIS — I340 Nonrheumatic mitral (valve) insufficiency: Secondary | ICD-10-CM

## 2018-03-29 HISTORY — PX: CARDIOVERSION: SHX1299

## 2018-03-29 HISTORY — PX: TEE WITHOUT CARDIOVERSION: SHX5443

## 2018-03-29 LAB — CBC WITH DIFFERENTIAL/PLATELET
ABS IMMATURE GRANULOCYTES: 0.02 10*3/uL (ref 0.00–0.07)
BASOS ABS: 0.1 10*3/uL (ref 0.0–0.1)
BASOS PCT: 1 %
Eosinophils Absolute: 0.1 10*3/uL (ref 0.0–0.5)
Eosinophils Relative: 1 %
HCT: 42.9 % (ref 39.0–52.0)
Hemoglobin: 13.5 g/dL (ref 13.0–17.0)
IMMATURE GRANULOCYTES: 0 %
Lymphocytes Relative: 19 %
Lymphs Abs: 1.8 10*3/uL (ref 0.7–4.0)
MCH: 28.9 pg (ref 26.0–34.0)
MCHC: 31.5 g/dL (ref 30.0–36.0)
MCV: 91.9 fL (ref 80.0–100.0)
Monocytes Absolute: 1.1 10*3/uL — ABNORMAL HIGH (ref 0.1–1.0)
Monocytes Relative: 12 %
NEUTROS ABS: 6 10*3/uL (ref 1.7–7.7)
NEUTROS PCT: 67 %
NRBC: 0 % (ref 0.0–0.2)
PLATELETS: 221 10*3/uL (ref 150–400)
RBC: 4.67 MIL/uL (ref 4.22–5.81)
RDW: 13 % (ref 11.5–15.5)
WBC: 9 10*3/uL (ref 4.0–10.5)

## 2018-03-29 LAB — BASIC METABOLIC PANEL
ANION GAP: 12 (ref 5–15)
BUN: 32 mg/dL — ABNORMAL HIGH (ref 8–23)
CALCIUM: 8.7 mg/dL — AB (ref 8.9–10.3)
CO2: 29 mmol/L (ref 22–32)
Chloride: 94 mmol/L — ABNORMAL LOW (ref 98–111)
Creatinine, Ser: 1.71 mg/dL — ABNORMAL HIGH (ref 0.61–1.24)
GFR calc Af Amer: 43 mL/min — ABNORMAL LOW (ref >60)
GFR, EST NON AFRICAN AMERICAN: 37 mL/min — AB (ref >60)
Glucose, Bld: 279 mg/dL — ABNORMAL HIGH (ref 70–99)
POTASSIUM: 3.6 mmol/L (ref 3.5–5.1)
SODIUM: 135 mmol/L (ref 135–145)

## 2018-03-29 LAB — GLUCOSE, CAPILLARY
GLUCOSE-CAPILLARY: 145 mg/dL — AB (ref 70–99)
GLUCOSE-CAPILLARY: 204 mg/dL — AB (ref 70–99)
Glucose-Capillary: 111 mg/dL — ABNORMAL HIGH (ref 70–99)
Glucose-Capillary: 99 mg/dL (ref 70–99)

## 2018-03-29 LAB — COOXEMETRY PANEL
Carboxyhemoglobin: 1.2 % (ref 0.5–1.5)
Methemoglobin: 1.6 % — ABNORMAL HIGH (ref 0.0–1.5)
O2 SAT: 62.2 %
TOTAL HEMOGLOBIN: 14 g/dL (ref 12.0–16.0)

## 2018-03-29 LAB — MAGNESIUM: MAGNESIUM: 2.3 mg/dL (ref 1.7–2.4)

## 2018-03-29 LAB — APTT: aPTT: 39 seconds — ABNORMAL HIGH (ref 24–36)

## 2018-03-29 LAB — HEPARIN LEVEL (UNFRACTIONATED)

## 2018-03-29 SURGERY — ECHOCARDIOGRAM, TRANSESOPHAGEAL
Anesthesia: General

## 2018-03-29 MED ORDER — PROPOFOL 10 MG/ML IV BOLUS
INTRAVENOUS | Status: DC | PRN
  Administered 2018-03-29 (×2): 25 mg via INTRAVENOUS

## 2018-03-29 MED ORDER — METOLAZONE 5 MG PO TABS
2.5000 mg | ORAL_TABLET | Freq: Once | ORAL | Status: AC
  Administered 2018-03-29: 2.5 mg via ORAL
  Filled 2018-03-29: qty 1

## 2018-03-29 MED ORDER — POTASSIUM CHLORIDE CRYS ER 20 MEQ PO TBCR
20.0000 meq | EXTENDED_RELEASE_TABLET | Freq: Once | ORAL | Status: AC
  Administered 2018-03-29: 20 meq via ORAL
  Filled 2018-03-29: qty 1

## 2018-03-29 MED ORDER — PROPOFOL 500 MG/50ML IV EMUL
INTRAVENOUS | Status: DC | PRN
  Administered 2018-03-29: 150 ug/kg/min via INTRAVENOUS

## 2018-03-29 MED ORDER — PROPOFOL 10 MG/ML IV BOLUS: INTRAVENOUS | Status: DC | PRN

## 2018-03-29 MED ORDER — AMIODARONE LOAD VIA INFUSION
150.0000 mg | Freq: Once | INTRAVENOUS | Status: AC
  Administered 2018-03-29: 150 mg via INTRAVENOUS
  Filled 2018-03-29: qty 83.34

## 2018-03-29 NOTE — Progress Notes (Signed)
   03/29/18 1714  Vitals  BP 122/71  MAP (mmHg) 87  ECG Heart Rate (!) 132  Pt spontaneously converted back to afib after successful DCCV. Notified NP Brion Aliment with cardiology. Pt asymptomatic. Orders placed for amiodarone bolus. I will continue to monitor pt closely.   Leary Roca, RN

## 2018-03-29 NOTE — Progress Notes (Addendum)
Advanced Heart Failure Rounding Note  PCP-Cardiologist: [name], MD   Subjective:    Started on amio drip, eliquis, and sprio 03/27/18. Remains in Afib 100-120s.   Remains on lasix gtt at 15 mg/hr. - 3.7 L weight shows down 12 lbs. Coox 62.2% CVP 12-13  Feeling much better today. Denies SOB. UOP much improved. Appetite coming around.   Objective:   Weight Range: 122.2 kg Body mass index is 36.54 kg/m.   Vital Signs:   Temp:  [97.5 F (36.4 C)-98 F (36.7 C)] 97.6 F (36.4 C) (11/15 0729) Pulse Rate:  [57-108] 105 (11/15 0729) Resp:  [18-20] 18 (11/15 0434) BP: (109-138)/(71-109) 135/109 (11/15 0729) SpO2:  [96 %-100 %] 98 % (11/15 0729) Weight:  [122.2 kg] 122.2 kg (11/15 0434) Last BM Date: 03/27/18  Weight change: Filed Weights   03/27/18 0459 03/28/18 0403 03/29/18 0434  Weight: 129.5 kg 127.8 kg 122.2 kg    Intake/Output:   Intake/Output Summary (Last 24 hours) at 03/29/2018 0931 Last data filed at 03/29/2018 0500 Gross per 24 hour  Intake 1003.04 ml  Output 4725 ml  Net -3721.96 ml      Physical Exam    General: NAD  HEENT: Normal Neck: Supple. JVP to jaw.. Carotids 2+ bilat; no bruits. No thyromegaly or nodule noted. Cor: PMI nondisplaced. Tachy, irregular. No M/G/R noted Lungs: CTAB, normal effort. Abdomen: Soft, non-tender, non-distended, no HSM. No bruits or masses. +BS  Extremities: No cyanosis, clubbing, or rash. Trace to 1+ edema above knees.  Neuro: Alert & orientedx3, cranial nerves grossly intact. moves all 4 extremities w/o difficulty. Affect pleasant   Telemetry   Afib 110-130s, personally reviewed.   EKG    No new tracings.    Labs    CBC Recent Labs    03/28/18 0452 03/29/18 0214  WBC 9.0 9.0  NEUTROABS  --  6.0  HGB 13.5 13.5  HCT 43.2 42.9  MCV 93.5 91.9  PLT 226 221   Basic Metabolic Panel Recent Labs    87/56/43 0452 03/29/18 0214  NA 136 135  K 3.6 3.6  CL 99 94*  CO2 25 29  GLUCOSE 220*  279*  BUN 34* 32*  CREATININE 1.85* 1.71*  CALCIUM 8.6* 8.7*  MG 2.3 2.3   Liver Function Tests No results for input(s): AST, ALT, ALKPHOS, BILITOT, PROT, ALBUMIN in the last 72 hours. No results for input(s): LIPASE, AMYLASE in the last 72 hours. Cardiac Enzymes No results for input(s): CKTOTAL, CKMB, CKMBINDEX, TROPONINI in the last 72 hours.  BNP: BNP (last 3 results) Recent Labs    03/25/18 1225  BNP 381.9*    ProBNP (last 3 results) No results for input(s): PROBNP in the last 8760 hours.   D-Dimer No results for input(s): DDIMER in the last 72 hours. Hemoglobin A1C No results for input(s): HGBA1C in the last 72 hours. Fasting Lipid Panel No results for input(s): CHOL, HDL, LDLCALC, TRIG, CHOLHDL, LDLDIRECT in the last 72 hours. Thyroid Function Tests No results for input(s): TSH, T4TOTAL, T3FREE, THYROIDAB in the last 72 hours.  Invalid input(s): FREET3  Other results:   Imaging    No results found.   Medications:     Scheduled Medications: . apixaban  5 mg Oral BID  . atorvastatin  80 mg Oral q1800  . digoxin  0.125 mg Oral Daily  . glimepiride  1 mg Oral BID  . insulin aspart  0-9 Units Subcutaneous TID WC  . loratadine  10  mg Oral Daily  . mometasone-formoterol  2 puff Inhalation BID  . potassium chloride  40 mEq Oral Daily  . sodium chloride flush  3 mL Intravenous Q12H  . spironolactone  25 mg Oral Daily    Infusions: . sodium chloride    . amiodarone 30 mg/hr (03/29/18 0854)  . furosemide (LASIX) infusion 15 mg/hr (03/29/18 0517)    PRN Medications: sodium chloride, acetaminophen, levalbuterol, nitroGLYCERIN, ondansetron (ZOFRAN) IV, sodium chloride flush    Patient Profile   David Huynh is a 77 y.o. male with a history of CAD, chronic combined HF, HTN, HLD, DM, CKD 3, and asthma.  He presented to [facility] with SOB. Found to be in afib RVR. Repeat echo showed reduced EF to 35-40% (prior echo EF  55-60%).  Assessment/Plan   1. Acute systolic CHF: Prior ischemic cardiomyopathy, improved after PCI.  Echo this admission with EF back down to 35-40% and moderate-severe RV dysfunction.  He has had no chest pain, only dyspnea.  TnI only slightly elevated around 0.04 with no trend.  Creatinine up 1.7 this am. Cannot rule out worsening coronary disease, but do not think acute or recent ACS.  It is possible that this is a tachycardia-mediated CMP (also has significant RV dysfunction), not sure how long he has been in atrial fibrillation as he does not feel palpitations.   - Coox 62% this am with better diuresis.  - JVP remains elevated.  - Would avoid milrinone at this point with rapid atrial fibrillation.   - Continue digoxin 0.125 mg daily.  - Control HR with IV amiodarone.  - Continue Lasix gtt 15 mg/hr for now with good response and improvement of Cr.  - CVP 12-13. Will give metolazone 2.5 mg  - Plan TEE-guided DCCV after 5 doses of Eliquis (see below).  - He will need catheterization eventually. See discussion below.  - Continue spironolactone to 25 mg daily.  - No ACEi/ARB/Entresto yet with elevated creatinine.  2. Atrial fibrillation: Noted for the first time this admission.  Not sure how long he has been in afib as he does not feel palpitations. Has been short of breath for around 3 wks.  As above, concerned for possible tachy-mediated CMP.   - Continue amiodarone gtt for rate control (HR still high).  - Eliquis 5 mg BID - Has recevied 5 total doses. Plan TEE-DCCV this pm.  - In future, atrial fibrillation ablation would be consideration. Discussed this am.  3. AKI: ?Baseline.  - Creatinine up to 1.71 from 1.5 since admitted.  Down with diuresis overnight.  - Continue to follow closely with RV failure.  4. CAD: History of DES to RCA in 9/17.  This admission, TnI mildly elevated at 0.04 with no trend => suspect demand ischemia with volume overload.  As above, doubt ACS but cannot rule  out worsening of CAD over the last couple of years.   - No ASA with stable CAD on Eliquis - Continue statin.  - As above, should have cath eventually given fall in EF.  However, with rise in creatinine and no chest pain, will hold off acutely. Plan to wait 1 month post-DCCV then can hold anticoagulation for [facility].   Medication concerns reviewed with patient and pharmacy team. Barriers identified: None at this time.   Length of Stay: 3  Luane School  03/29/2018, 9:31 AM  Advanced Heart Failure Team Pager [phone] (M-F; 7a - 4p)  Please contact [facility] for night-coverage after  hours (4p -7a ) and weekends on [email or web address]  Patient seen with NP, agree with the above note.   Patient underwent TEE-guided DCCV today.  TEE showed EF in the 30-35% range.  He was in NSR in 80s-90s with PACs post-procedure.    On exam, JVP 12 cm, clear lungs, irregular S1S2 => regular, 1+ edema to thighs. Mild abdominal distention but soft.   1. Acute systolic CHF: Prior ischemic cardiomyopathy, improved after PCI. Echo this admission with EF back down to 35-40% and moderate-severe RV dysfunction. He has had no chest pain, only dyspnea. TnI only slightly elevated around 0.04 with no trend. Creatinine up to 1.7. Cannot rule out worsening coronary disease, but do not think acute or recent ACS. It is possible that this is a tachycardia-mediated CMP (also has significant RV dysfunction), not sure how long he has been in atrial fibrillation as he does not feel palpitations.  He diuresed well yesterday, CVP down to 12-13.  Creatinine stable at 1.71, co-ox 62%.  Now back in NSR.   - Continue Lasix gtt at 15 mg/hr and will give a dose of metolazone 2.5 x 1 again today.  May be able to transition to po tomorrow.    - He will need catheterization eventually. As above, I doubt he has had recent ACS. Therefore, I do not think cath is an urgency. As creatinine has been elevated, I cardioverted him  without cathing. Can do right/left heart cath a month or so post-DCCV when it is relatively safe to pause anticoagulation for a couple of days.  - Continue spironolactone to 25 mg daily.  - Digoxin begun with initial low co-ox.  - If creatinine lower tomorrow, would add low dose losartan.  - No ACEi/ARB/Entresto yet with elevated creatinine.  2. Atrial fibrillation: Noted for the first time this admission. Not sure how long he has been in afib as he does not feel palpitations. Has been short of breath for around 3 wks. As above, concerned for possible tachy-mediated CMP. Now s/p TEE-guided DCCV.  - Continue amiodarone gtt today, transition to po tomorrow.  - Eliquis 5 mg bid.  - In future, atrial fibrillation ablation would be consideration.  3. AKI: ?Baseline. Creatinine up from 1.5 since admitted. Follow closely with diuresis, creatinine may improve with fall in renal venous pressure. Creatinine down to 1.7 with diuresis.  4. CAD: History of DES to RCA in 9/17. This admission, TnI mildly elevated at 0.04 with no trend =>suspect demand ischemia with volume overload. As above, doubt ACS but cannot rule out worsening of CAD over the last couple of years.  - Can stop ASA with Eliquis use.  - Continue statin.  - As above, should have cath eventually given fall in EF. However, with rise in creatinine and no chest pain, will hold off acutely. Plan to wait 1 month post-DCCV then can hold anticoagulation for [facility].   [name] 03/29/2018 1:43 PM

## 2018-03-29 NOTE — Care Management Important Message (Signed)
Important Message  Patient Details  Name: David Huynh MRN: [id number] Date of Birth: 07/16/1940   Medicare Important Message Given:  Yes    Dorena Bodo 03/29/2018, 4:21 PM

## 2018-03-29 NOTE — Progress Notes (Addendum)
Pt had an 8 beat run of v tach. This is unusual for him. Last K was 3.6 with 40 mEq daily replacement. On lasix gtt. Daily output totaling 4350 ml. Concerned about K.  Paged on call Chakravartti. Will continue to monitor.   Drew morning labs early. Will check for results and new orders.

## 2018-03-29 NOTE — Progress Notes (Signed)
Pt spontaneously converted back to afib after successful DCCV. Notified NP Brion Aliment with cardiology. Pt asymptomatic. Orders placed for amiodarone bolus. I will continue to monitor pt closely.   Leary Roca, RN

## 2018-03-29 NOTE — Discharge Instructions (Signed)

## 2018-03-29 NOTE — CV Procedure (Signed)
Procedure: TEE  Sedation: Per anesthesiology  Indication: Atrial fibrillation  Findings: Please see echo section for full report.  Mildly dilated LV with mild LV hypertrophy.  EF 30-35%, diffuse hypokinesis.  Normal RV size and systolic function.  Mild left atrial enlargement, no LA appendage thrombus.  Mild right atrial enlargement.  No ASD or PFO by color doppler.  Trivial TR.  Mild mitral regurgitation.  Mildly calcified, trileaflet aortic valve with trivial regurgitation and no stenosis.  Normal caliber thoracic aorta with grade 3 plaque in the descending thoracic aorta.   May proceed to DCCV.   David Huynh 03/29/2018 1:37 PM

## 2018-03-29 NOTE — Anesthesia Preprocedure Evaluation (Addendum)
Anesthesia Evaluation  Patient identified by MRN, date of birth, ID band Patient awake    Reviewed: Allergy & Precautions, NPO status , Patient's Chart, lab work & pertinent test results, reviewed documented beta blocker date and time   Airway Mallampati: III  TM Distance: >3 FB Neck ROM: Full    Dental no notable dental hx.    Pulmonary asthma ,    Pulmonary exam normal        Cardiovascular hypertension, Pt. on medications and Pt. on home beta blockers + CAD, + Cardiac Stents and +CHF  + dysrhythmias Atrial Fibrillation  Rhythm:Irregular  TTE 03/26/18: Study Conclusions  - Left ventricle: The cavity size was normal. Wall thickness was   increased in a pattern of mild LVH. Systolic function was   moderately reduced. The estimated ejection fraction was in the   range of 35% to 40%. Moderate diffuse hypokinesis with no   identifiable regional variations. Atrial fibrillation lessens the   specificty of the findings, but Doppler parameters are consistent   with restrictive physiology, indicative of decreased left   ventricular diastolic compliance and/or increased left atrial   pressure. Acoustic contrast opacification revealed no evidence   ofthrombus. - Mitral valve: Calcified annulus. There was moderate regurgitation   directed centrally. - Left atrium: The atrium was severely dilated. - Right ventricle: The cavity size was moderately to severely   dilated. Systolic function was moderately to severely reduced. - Right atrium: The atrium was severely dilated. - Pulmonary arteries: Systolic pressure was mildly increased. PA   peak pressure: 38 mm Hg (S).   Neuro/Psych negative neurological ROS  negative psych ROS   GI/Hepatic negative GI ROS, Neg liver ROS,   Endo/Other  diabetes, Oral Hypoglycemic Agents  Renal/GU Renal InsufficiencyRenal disease  negative genitourinary   Musculoskeletal negative musculoskeletal  ROS (+)   Abdominal   Peds  Hematology negative hematology ROS (+)   Anesthesia Other Findings   Reproductive/Obstetrics                            Anesthesia Physical Anesthesia Plan  ASA: III  Anesthesia Plan: General   Post-op Pain Management:    Induction: Intravenous  PONV Risk Score and Plan: 2 and Propofol infusion, TIVA and Treatment may vary due to age or medical condition  Airway Management Planned: Natural Airway, Nasal Cannula, Simple Face Mask and Mask  Additional Equipment: None  Intra-op Plan:   Post-operative Plan:   Informed Consent: I have reviewed the patients History and Physical, chart, labs and discussed the procedure including the risks, benefits and alternatives for the proposed anesthesia with the patient or authorized representative who has indicated his/her understanding and acceptance.     Plan Discussed with:   Anesthesia Plan Comments:         Anesthesia Quick Evaluation

## 2018-03-29 NOTE — Transfer of Care (Signed)
Immediate Anesthesia Transfer of Care Note  Patient: David Huynh  Procedure(s) Performed: TRANSESOPHAGEAL ECHOCARDIOGRAM (TEE) (N/A ) CARDIOVERSION (N/A )  Patient Location: Endoscopy Unit  Anesthesia Type:MAC  Level of Consciousness: drowsy  Airway & Oxygen Therapy: Patient Spontanous Breathing and Patient connected to nasal cannula oxygen  Post-op Assessment: Report given to RN and Post -op Vital signs reviewed and stable  Post vital signs: Reviewed and stable  Last Vitals:  Vitals Value Taken Time  BP    Temp    Pulse    Resp    SpO2      Last Pain:  Vitals:   03/29/18 1202  TempSrc: Oral  PainSc: 0-No pain      Patients Stated Pain Goal: 0 (74/16/38 4536)  Complications: No apparent anesthesia complications

## 2018-03-29 NOTE — Interval H&P Note (Signed)
History and Physical Interval Note:  03/29/2018 1:22 PM  David Huynh  has presented today for surgery, with the diagnosis of ATRIAL FIBRILLATION  The various methods of treatment have been discussed with the patient and family. After consideration of risks, benefits and other options for treatment, the patient has consented to  Procedure(s): TRANSESOPHAGEAL ECHOCARDIOGRAM (TEE) (N/A) CARDIOVERSION (N/A) as a surgical intervention .  The patient's history has been reviewed, patient examined, no change in status, stable for surgery.  I have reviewed the patient's chart and labs.  Questions were answered to the patient's satisfaction.     Demetre Monaco [employer]

## 2018-03-29 NOTE — Procedures (Signed)
Electrical Cardioversion Procedure Note David Huynh [id number] 11-Feb-1941  Procedure: Electrical Cardioversion Indications:  Atrial Fibrillation  Procedure Details Consent: Risks of procedure as well as the alternatives and risks of each were explained to the (patient/caregiver).  Consent for procedure obtained. Time Out: Verified patient identification, verified procedure, site/side was marked, verified correct patient position, special equipment/implants available, medications/allergies/relevent history reviewed, required imaging and test results available.  Performed  Patient placed on cardiac monitor, pulse oximetry, supplemental oxygen as necessary.  Sedation given: Propofol per anesthesiology Pacer pads placed anterior and posterior chest.  Cardioverted 1 time(s).  Cardioverted at 200J.  Evaluation Findings: Post procedure EKG shows: NSR Complications: None Patient did tolerate procedure well.   David Huynh 03/29/2018, 1:37 PM

## 2018-03-29 NOTE — Progress Notes (Signed)
  Echocardiogram 2D Echocardiogram has been performed.  Kihanna Kamiya L Androw 03/29/2018, 1:48 PM

## 2018-03-30 DIAGNOSIS — I480 Paroxysmal atrial fibrillation: Secondary | ICD-10-CM

## 2018-03-30 LAB — GLUCOSE, CAPILLARY
GLUCOSE-CAPILLARY: 131 mg/dL — AB (ref 70–99)
Glucose-Capillary: 161 mg/dL — ABNORMAL HIGH (ref 70–99)
Glucose-Capillary: 100 mg/dL — ABNORMAL HIGH (ref 70–99)
Glucose-Capillary: 191 mg/dL — ABNORMAL HIGH (ref 70–99)

## 2018-03-30 LAB — CBC WITH DIFFERENTIAL/PLATELET
ABS IMMATURE GRANULOCYTES: 0.03 10*3/uL (ref 0.00–0.07)
BASOS ABS: 0.1 10*3/uL (ref 0.0–0.1)
BASOS PCT: 1 %
Eosinophils Absolute: 0.1 10*3/uL (ref 0.0–0.5)
Eosinophils Relative: 1 %
HCT: 43.6 % (ref 39.0–52.0)
Hemoglobin: 13.7 g/dL (ref 13.0–17.0)
IMMATURE GRANULOCYTES: 0 %
LYMPHS ABS: 1.3 10*3/uL (ref 0.7–4.0)
Lymphocytes Relative: 13 %
MCH: 28.5 pg (ref 26.0–34.0)
MCHC: 31.4 g/dL (ref 30.0–36.0)
MCV: 90.8 fL (ref 80.0–100.0)
MONOS PCT: 12 %
Monocytes Absolute: 1.2 10*3/uL — ABNORMAL HIGH (ref 0.1–1.0)
NEUTROS PCT: 73 %
NRBC: 0 % (ref 0.0–0.2)
Neutro Abs: 7.4 10*3/uL (ref 1.7–7.7)
Platelets: 234 10*3/uL (ref 150–400)
RBC: 4.8 MIL/uL (ref 4.22–5.81)
RDW: 12.7 % (ref 11.5–15.5)
WBC: 10 10*3/uL (ref 4.0–10.5)

## 2018-03-30 LAB — MAGNESIUM: MAGNESIUM: 2.1 mg/dL (ref 1.7–2.4)

## 2018-03-30 LAB — CULTURE, BLOOD (ROUTINE X 2)
Culture: NO GROWTH
Culture: NO GROWTH
SPECIAL REQUESTS: ADEQUATE
Special Requests: ADEQUATE

## 2018-03-30 LAB — BASIC METABOLIC PANEL
ANION GAP: 13 (ref 5–15)
BUN: 30 mg/dL — ABNORMAL HIGH (ref 8–23)
CALCIUM: 8.9 mg/dL (ref 8.9–10.3)
CO2: 32 mmol/L (ref 22–32)
Chloride: 88 mmol/L — ABNORMAL LOW (ref 98–111)
Creatinine, Ser: 1.75 mg/dL — ABNORMAL HIGH (ref 0.61–1.24)
GFR calc non Af Amer: 36 mL/min — ABNORMAL LOW (ref >60)
GFR, EST AFRICAN AMERICAN: 42 mL/min — AB (ref >60)
GLUCOSE: 269 mg/dL — AB (ref 70–99)
POTASSIUM: 3.2 mmol/L — AB (ref 3.5–5.1)
Sodium: 133 mmol/L — ABNORMAL LOW (ref 135–145)

## 2018-03-30 LAB — COOXEMETRY PANEL
Carboxyhemoglobin: 1.3 % (ref 0.5–1.5)
Methemoglobin: 1.7 % — ABNORMAL HIGH (ref 0.0–1.5)
O2 Saturation: 60.9 %
Total hemoglobin: 14.1 g/dL (ref 12.0–16.0)

## 2018-03-30 MED ORDER — POTASSIUM CHLORIDE CRYS ER 20 MEQ PO TBCR
40.0000 meq | EXTENDED_RELEASE_TABLET | Freq: Once | ORAL | Status: AC
  Administered 2018-03-30: 40 meq via ORAL
  Filled 2018-03-30: qty 2

## 2018-03-30 MED ORDER — RANOLAZINE ER 500 MG PO TB12
500.0000 mg | ORAL_TABLET | Freq: Two times a day (BID) | ORAL | Status: DC
  Administered 2018-03-30 – 2018-04-04 (×11): 500 mg via ORAL
  Filled 2018-03-30 (×11): qty 1

## 2018-03-30 MED ORDER — POTASSIUM CHLORIDE CRYS ER 20 MEQ PO TBCR
40.0000 meq | EXTENDED_RELEASE_TABLET | Freq: Once | ORAL | Status: AC
  Administered 2018-03-30 (×2): 40 meq via ORAL
  Filled 2018-03-30: qty 2

## 2018-03-30 NOTE — Progress Notes (Addendum)
Advanced Heart Failure Rounding Note  PCP-Cardiologist: [name], MD   Subjective:    Underwent DC-CV on 11/15. Was successful but now back in AF. Remains on IV amio   Remains on lasix gtt at 15 mg/hr. Weight down another 10 pounds CVP 9-10  Denies CP or SOB. No orthopnea or PND.   Objective:   Weight Range: 117.8 kg Body mass index is 35.22 kg/m.   Vital Signs:   Temp:  [97.3 F (36.3 C)-97.7 F (36.5 C)] 97.5 F (36.4 C) (11/16 0503) Pulse Rate:  [69-121] 100 (11/16 0503) Resp:  [18-27] 18 (11/16 0503) BP: (93-144)/(62-118) 135/83 (11/16 0503) SpO2:  [91 %-99 %] 96 % (11/16 0844) Weight:  [117.8 kg] 117.8 kg (11/16 0503) Last BM Date: 03/25/18  Weight change: Filed Weights   03/28/18 0403 03/29/18 0434 03/30/18 0503  Weight: 127.8 kg 122.2 kg 117.8 kg    Intake/Output:   Intake/Output Summary (Last 24 hours) at 03/30/2018 1140 Last data filed at 03/30/2018 0800 Gross per 24 hour  Intake 1137 ml  Output -  Net 1137 ml      Physical Exam    General:  Well appearing. No resp difficulty HEENT: normal Neck: supple. JVP 10 Carotids 2+ bilat; no bruits. No lymphadenopathy or thryomegaly appreciated. Cor: PMI nondisplaced. Irregular rate & rhythm. No rubs, gallops or murmurs. Lungs: clear Abdomen: soft, nontender, nondistended. No hepatosplenomegaly. No bruits or masses. Good bowel sounds. Extremities: no cyanosis, clubbing, rash, 2+ edema Neuro: alert & orientedx3, cranial nerves grossly intact. moves all 4 extremities w/o difficulty. Affect pleasant  Telemetry   Afib 110-120s, personally reviewed.   EKG    No new tracings.    Labs    CBC Recent Labs    03/29/18 0214 03/30/18 0603  WBC 9.0 10.0  NEUTROABS 6.0 7.4  HGB 13.5 13.7  HCT 42.9 43.6  MCV 91.9 90.8  PLT 221 234   Basic Metabolic Panel Recent Labs    15/17/61 0214 03/30/18 0603  NA 135 133*  K 3.6 3.2*  CL 94* 88*  CO2 29 32  GLUCOSE 279* 269*  BUN 32* 30*    CREATININE 1.71* 1.75*  CALCIUM 8.7* 8.9  MG 2.3 2.1   Liver Function Tests No results for input(s): AST, ALT, ALKPHOS, BILITOT, PROT, ALBUMIN in the last 72 hours. No results for input(s): LIPASE, AMYLASE in the last 72 hours. Cardiac Enzymes No results for input(s): CKTOTAL, CKMB, CKMBINDEX, TROPONINI in the last 72 hours.  BNP: BNP (last 3 results) Recent Labs    03/25/18 1225  BNP 381.9*    ProBNP (last 3 results) No results for input(s): PROBNP in the last 8760 hours.   D-Dimer No results for input(s): DDIMER in the last 72 hours. Hemoglobin A1C No results for input(s): HGBA1C in the last 72 hours. Fasting Lipid Panel No results for input(s): CHOL, HDL, LDLCALC, TRIG, CHOLHDL, LDLDIRECT in the last 72 hours. Thyroid Function Tests No results for input(s): TSH, T4TOTAL, T3FREE, THYROIDAB in the last 72 hours.  Invalid input(s): FREET3  Other results:   Imaging    No results found.   Medications:     Scheduled Medications: . apixaban  5 mg Oral BID  . atorvastatin  80 mg Oral q1800  . digoxin  0.125 mg Oral Daily  . glimepiride  1 mg Oral BID  . insulin aspart  0-9 Units Subcutaneous TID WC  . loratadine  10 mg Oral Daily  . mometasone-formoterol  2 puff  Inhalation BID  . potassium chloride  40 mEq Oral Daily  . sodium chloride flush  3 mL Intravenous Q12H  . spironolactone  25 mg Oral Daily    Infusions: . sodium chloride    . amiodarone 30 mg/hr (03/29/18 0854)  . furosemide (LASIX) infusion 15 mg/hr (03/29/18 0517)    PRN Medications: sodium chloride, acetaminophen, levalbuterol, nitroGLYCERIN, ondansetron (ZOFRAN) IV, sodium chloride flush    Patient Profile   David Huynh is a 77 y.o. male with a history of CAD, chronic combined HF, HTN, HLD, DM, CKD 3, and asthma.  He presented to [facility] with SOB. Found to be in afib RVR. Repeat echo showed reduced EF to 35-40% (prior echo EF 55-60%).  Assessment/Plan   1. Acute  systolic CHF: Prior ischemic cardiomyopathy, improved after PCI.  Echo this admission with EF back down to 35-40% and moderate-severe RV dysfunction.  He has had no chest pain, only dyspnea.  TnI only slightly elevated around 0.04 with no trend.  Creatinine up 1.7 this am. Cannot rule out worsening coronary disease, but do not think acute or recent ACS.  It is possible that this is a tachycardia-mediated CMP (also has significant RV dysfunction), not sure how long he has been in atrial fibrillation as he does not feel palpitations.   - Coox 61% - Diuresing well. Weight down another 10 pounds (26 pounds total). CVP 9-10. Continue lasix gtt at 15 for 1 more day. - Continue digoxin 0.125 mg daily.  - Control HR with IV amiodarone.  - Back in AF after DC-CV yesterday. Continue amio. Add Ranexa. May need EP input regarding RFA.  - He will need catheterization eventually. See discussion below.  - Continue spironolactone to 25 mg daily.  - No ACEi/ARB/Entresto yet with elevated creatinine.  - Place TED hose 2. Atrial fibrillation: Noted for the first time this admission.  Not sure how long he has been in afib as he does not feel palpitations. Has been short of breath for around 3 wks.  As above, concerned for possible tachy-mediated CMP.  Had successful DC-CV on 11/15. Now back in AF - Continue amiodarone gtt. Add Ranexa and can consider repeat DC-CV Monday or Tuesday. . - Eliquis 5 mg BID - May need EP input regarding RFA.  3. AKI: ?Baseline.  - Creatinine up to 1.71 from 1.5 since admitted.  Stable at 1.75 today - Continue to follow closely with RV failure and diuresis .  4. CAD: History of DES to RCA in 9/17.  This admission, TnI mildly elevated at 0.04 with no trend => suspect demand ischemia with volume overload.  As above, doubt ACS but cannot rule out worsening of CAD over the last couple of years.   - No ASA with stable CAD on Eliquis - Continue statin.  - As above, should have cath eventually  given fall in EF.  However, with rise in creatinine and no chest pain, will hold off acutely. Plan to wait 1 month post-DCCV then can hold anticoagulation for [facility].  5. Hypokalemia - will supp.   Medication concerns reviewed with patient and pharmacy team. Barriers identified: None at this time.   Length of Stay: 4  Arvilla Meres, MD  03/30/2018, 11:40 AM  Advanced Heart Failure Team Pager [phone] (M-F; 7a - 4p)  Please contact [facility] for night-coverage after hours (4p -7a ) and weekends on [email or web address]

## 2018-03-31 LAB — MAGNESIUM: Magnesium: 2.4 mg/dL (ref 1.7–2.4)

## 2018-03-31 LAB — CBC WITH DIFFERENTIAL/PLATELET
Abs Immature Granulocytes: 0.02 10*3/uL (ref 0.00–0.07)
Basophils Absolute: 0 10*3/uL (ref 0.0–0.1)
Basophils Relative: 1 %
EOS PCT: 2 %
Eosinophils Absolute: 0.1 10*3/uL (ref 0.0–0.5)
HEMATOCRIT: 45.9 % (ref 39.0–52.0)
HEMOGLOBIN: 14.6 g/dL (ref 13.0–17.0)
Immature Granulocytes: 0 %
LYMPHS ABS: 1.9 10*3/uL (ref 0.7–4.0)
LYMPHS PCT: 22 %
MCH: 28.6 pg (ref 26.0–34.0)
MCHC: 31.8 g/dL (ref 30.0–36.0)
MCV: 90 fL (ref 80.0–100.0)
MONO ABS: 1.1 10*3/uL — AB (ref 0.1–1.0)
Monocytes Relative: 13 %
Neutro Abs: 5.4 10*3/uL (ref 1.7–7.7)
Neutrophils Relative %: 62 %
Platelets: 226 10*3/uL (ref 150–400)
RBC: 5.1 MIL/uL (ref 4.22–5.81)
RDW: 12.7 % (ref 11.5–15.5)
WBC: 8.6 10*3/uL (ref 4.0–10.5)
nRBC: 0 % (ref 0.0–0.2)

## 2018-03-31 LAB — BASIC METABOLIC PANEL
Anion gap: 13 (ref 5–15)
BUN: 30 mg/dL — AB (ref 8–23)
CHLORIDE: 91 mmol/L — AB (ref 98–111)
CO2: 32 mmol/L (ref 22–32)
CREATININE: 1.95 mg/dL — AB (ref 0.61–1.24)
Calcium: 9.3 mg/dL (ref 8.9–10.3)
GFR calc Af Amer: 37 mL/min — ABNORMAL LOW (ref >60)
GFR calc non Af Amer: 32 mL/min — ABNORMAL LOW (ref >60)
Glucose, Bld: 147 mg/dL — ABNORMAL HIGH (ref 70–99)
Potassium: 4.2 mmol/L (ref 3.5–5.1)
Sodium: 136 mmol/L (ref 135–145)

## 2018-03-31 LAB — COOXEMETRY PANEL
CARBOXYHEMOGLOBIN: 1.3 % (ref 0.5–1.5)
Methemoglobin: 1.7 % — ABNORMAL HIGH (ref 0.0–1.5)
O2 SAT: 51.8 %
Total hemoglobin: 13.8 g/dL (ref 12.0–16.0)

## 2018-03-31 LAB — GLUCOSE, CAPILLARY
Glucose-Capillary: 130 mg/dL — ABNORMAL HIGH (ref 70–99)
Glucose-Capillary: 197 mg/dL — ABNORMAL HIGH (ref 70–99)
Glucose-Capillary: 138 mg/dL — ABNORMAL HIGH (ref 70–99)
Glucose-Capillary: 199 mg/dL — ABNORMAL HIGH (ref 70–99)

## 2018-03-31 MED ORDER — POTASSIUM CHLORIDE CRYS ER 20 MEQ PO TBCR
40.0000 meq | EXTENDED_RELEASE_TABLET | Freq: Every day | ORAL | Status: DC
  Administered 2018-03-31: 40 meq via ORAL
  Filled 2018-03-31: qty 2

## 2018-03-31 NOTE — Progress Notes (Addendum)
Advanced Heart Failure Rounding Note  PCP-Cardiologist: [name], MD   Subjective:    Underwent DC-CV on 11/15. Was successful but reverted back to AF soon afterward.   Remains on IV amio. Ranexa added 11/16.   Was diuresing on IV lasix gtt at 15. Weight down 31 pounds. Lasix stopped this am with rise in creatinine 1.75-> 1.95   Co-ox 52% today. Feels good. HR varies from 85-145. No orthopnea or PND.   Objective:   Weight Range: 115.3 kg Body mass index is 34.49 kg/m.   Vital Signs:   Temp:  [97.5 F (36.4 C)-97.7 F (36.5 C)] 97.5 F (36.4 C) (11/17 1126) Pulse Rate:  [62-115] 113 (11/17 1126) Resp:  [15-20] 18 (11/17 1126) BP: (103-126)/(55-103) 113/81 (11/17 1126) SpO2:  [92 %-98 %] 93 % (11/17 1000) Weight:  [115.3 kg] 115.3 kg (11/17 0433) Last BM Date: 03/29/18(pt stated)  Weight change: Filed Weights   03/29/18 0434 03/30/18 0503 03/31/18 0433  Weight: 122.2 kg 117.8 kg 115.3 kg    Intake/Output:   Intake/Output Summary (Last 24 hours) at 03/31/2018 1420 Last data filed at 03/31/2018 0500 Gross per 24 hour  Intake 1362.77 ml  Output 1200 ml  Net 162.77 ml      Physical Exam    General:  Sitting in bed No resp difficulty HEENT: normal Neck: supple. JVP flat Carotids 2+ bilat; no bruits. No lymphadenopathy or thryomegaly appreciated. Cor: PMI nondisplaced. Irregular tachy. No rubs, gallops or murmurs. Lungs: clear Abdomen: obese soft, nontender, nondistended. No hepatosplenomegaly. No bruits or masses. Good bowel sounds. Extremities: no cyanosis, clubbing, rash, edema Neuro: alert & orientedx3, cranial nerves grossly intact. moves all 4 extremities w/o difficulty. Affect pleasant  Telemetry   Afib 85-145, personally reviewed.   EKG    No new tracings.    Labs    CBC Recent Labs    03/30/18 0603 03/31/18 0513  WBC 10.0 8.6  NEUTROABS 7.4 5.4  HGB 13.7 14.6  HCT 43.6 45.9  MCV 90.8 90.0  PLT 234 226   Basic Metabolic  Panel Recent Labs    03/30/18 0603 03/31/18 0513  NA 133* 136  K 3.2* 4.2  CL 88* 91*  CO2 32 32  GLUCOSE 269* 147*  BUN 30* 30*  CREATININE 1.75* 1.95*  CALCIUM 8.9 9.3  MG 2.1 2.4   Liver Function Tests No results for input(s): AST, ALT, ALKPHOS, BILITOT, PROT, ALBUMIN in the last 72 hours. No results for input(s): LIPASE, AMYLASE in the last 72 hours. Cardiac Enzymes No results for input(s): CKTOTAL, CKMB, CKMBINDEX, TROPONINI in the last 72 hours.  BNP: BNP (last 3 results) Recent Labs    03/25/18 1225  BNP 381.9*    ProBNP (last 3 results) No results for input(s): PROBNP in the last 8760 hours.   D-Dimer No results for input(s): DDIMER in the last 72 hours. Hemoglobin A1C No results for input(s): HGBA1C in the last 72 hours. Fasting Lipid Panel No results for input(s): CHOL, HDL, LDLCALC, TRIG, CHOLHDL, LDLDIRECT in the last 72 hours. Thyroid Function Tests No results for input(s): TSH, T4TOTAL, T3FREE, THYROIDAB in the last 72 hours.  Invalid input(s): FREET3  Other results:   Imaging    No results found.   Medications:     Scheduled Medications: . apixaban  5 mg Oral BID  . atorvastatin  80 mg Oral q1800  . digoxin  0.125 mg Oral Daily  . glimepiride  1 mg Oral BID  . insulin  aspart  0-9 Units Subcutaneous TID WC  . loratadine  10 mg Oral Daily  . mometasone-formoterol  2 puff Inhalation BID  . potassium chloride  40 mEq Oral Daily  . ranolazine  500 mg Oral BID  . sodium chloride flush  3 mL Intravenous Q12H  . spironolactone  25 mg Oral Daily    Infusions: . sodium chloride    . amiodarone 30 mg/hr (03/31/18 0500)    PRN Medications: sodium chloride, acetaminophen, levalbuterol, nitroGLYCERIN, ondansetron (ZOFRAN) IV, sodium chloride flush    Patient Profile   David Huynh is a 77 y.o. male with a history of CAD, chronic combined HF, HTN, HLD, DM, CKD 3, and asthma.  He presented to [facility] with SOB. Found to be  in afib RVR. Repeat echo showed reduced EF to 35-40% (prior echo EF 55-60%).  Assessment/Plan   1. Acute systolic CHF: Prior ischemic cardiomyopathy, improved after PCI.  Echo this admission with EF back down to 35-40% and moderate-severe RV dysfunction.  He has had no chest pain, only dyspnea.  TnI only slightly elevated around 0.04 with no trend.  Cannot rule out worsening coronary disease, but do not think acute or recent ACS.  It is possible that this is a tachycardia-mediated CMP (also has significant RV dysfunction), not sure how long he has been in atrial fibrillation as he does not feel palpitations.  Will need cath in 4 weeks or so when we can stop AC safely.  - Coox 61%-> 52%. Will follow. Suspect volume depleted.  - Has diuresed well. Weight down 31 pounds total. Lasix drip stopped today due to rising creatinine - Continue digoxin 0.125 mg daily.  - Continue spironolactone 25 mg daily.  - No ACEi/ARB/Entresto yet with elevated creatinine. No b-blocker with low output - Continue TED hose 2. Atrial fibrillation: Noted for the first time this admission.  Not sure how long he has been in afib as he does not feel palpitations. Has been short of breath for around 3 wks.  As above, concerned for possible tachy-mediated CMP.  Had successful DC-CV on 11/15. Now back in AF - Continue amiodarone gtt. Ranexa added and can consider repeat DC-CV Monday or Tuesday. Will make NPO after MN - Eliquis 5 mg BID - May need EP input regarding RFA.  3. AKI: ?Baseline.  - Creatinine up 1.5->1.7->1.95 - As above. Hold lasix.  4. CAD: History of DES to RCA in 9/17.  This admission, TnI mildly elevated at 0.04 with no trend => suspect demand ischemia with volume overload.  As above, doubt ACS but cannot rule out worsening of CAD over the last couple of years.   - No ASA with stable CAD on Eliquis - Continue statin.  - As above, should have cath eventually given fall in EF.  However, with rise in creatinine  and no chest pain, will hold off acutely. Plan to wait 1 month post-DCCV then can hold anticoagulation for [facility].  5. Hypokalemia - supped  6. DM2 - Ideally would get off glimepiride with CAD and switch to metformin +/- SGLT2 once renal function more stable.   Medication concerns reviewed with patient and pharmacy team. Barriers identified: None at this time.   Length of Stay: 5  Arvilla Meres, MD  03/31/2018, 2:20 PM  Advanced Heart Failure Team Pager [phone] (M-F; 7a - 4p)  Please contact [facility] for night-coverage after hours (4p -7a ) and weekends on [email or web address]

## 2018-04-01 ENCOUNTER — Encounter (HOSPITAL_COMMUNITY): Payer: Self-pay | Admitting: Cardiology

## 2018-04-01 LAB — CBC WITH DIFFERENTIAL/PLATELET
Abs Immature Granulocytes: 0.04 10*3/uL (ref 0.00–0.07)
BASOS ABS: 0.1 10*3/uL (ref 0.0–0.1)
Basophils Relative: 1 %
EOS ABS: 0.2 10*3/uL (ref 0.0–0.5)
EOS PCT: 2 %
HEMATOCRIT: 47.3 % (ref 39.0–52.0)
Hemoglobin: 14.6 g/dL (ref 13.0–17.0)
Immature Granulocytes: 0 %
Lymphocytes Relative: 22 %
Lymphs Abs: 2.2 10*3/uL (ref 0.7–4.0)
MCH: 28 pg (ref 26.0–34.0)
MCHC: 30.9 g/dL (ref 30.0–36.0)
MCV: 90.8 fL (ref 80.0–100.0)
MONO ABS: 1.2 10*3/uL — AB (ref 0.1–1.0)
MONOS PCT: 11 %
NRBC: 0 % (ref 0.0–0.2)
Neutro Abs: 6.5 10*3/uL (ref 1.7–7.7)
Neutrophils Relative %: 64 %
Platelets: 226 10*3/uL (ref 150–400)
RBC: 5.21 MIL/uL (ref 4.22–5.81)
RDW: 12.9 % (ref 11.5–15.5)
WBC: 10.2 10*3/uL (ref 4.0–10.5)

## 2018-04-01 LAB — GLUCOSE, CAPILLARY
GLUCOSE-CAPILLARY: 118 mg/dL — AB (ref 70–99)
GLUCOSE-CAPILLARY: 223 mg/dL — AB (ref 70–99)
Glucose-Capillary: 130 mg/dL — ABNORMAL HIGH (ref 70–99)
Glucose-Capillary: 123 mg/dL — ABNORMAL HIGH (ref 70–99)

## 2018-04-01 LAB — BASIC METABOLIC PANEL
Anion gap: 10 (ref 5–15)
BUN: 38 mg/dL — AB (ref 8–23)
CO2: 31 mmol/L (ref 22–32)
CREATININE: 1.86 mg/dL — AB (ref 0.61–1.24)
Calcium: 9 mg/dL (ref 8.9–10.3)
Chloride: 95 mmol/L — ABNORMAL LOW (ref 98–111)
GFR calc Af Amer: 39 mL/min — ABNORMAL LOW (ref >60)
GFR calc non Af Amer: 34 mL/min — ABNORMAL LOW (ref >60)
GLUCOSE: 144 mg/dL — AB (ref 70–99)
Potassium: 4.1 mmol/L (ref 3.5–5.1)
Sodium: 136 mmol/L (ref 135–145)

## 2018-04-01 LAB — COOXEMETRY PANEL
Carboxyhemoglobin: 1.3 % (ref 0.5–1.5)
METHEMOGLOBIN: 1.5 % (ref 0.0–1.5)
O2 Saturation: 62.1 %
Total hemoglobin: 15.1 g/dL (ref 12.0–16.0)

## 2018-04-01 LAB — MAGNESIUM: Magnesium: 2.5 mg/dL — ABNORMAL HIGH (ref 1.7–2.4)

## 2018-04-01 MED ORDER — SODIUM CHLORIDE 0.9% FLUSH
3.0000 mL | Freq: Two times a day (BID) | INTRAVENOUS | Status: DC
  Administered 2018-04-01 – 2018-04-03 (×3): 3 mL via INTRAVENOUS

## 2018-04-01 MED ORDER — SODIUM CHLORIDE 0.9% FLUSH: 3.0000 mL | INTRAVENOUS | Status: DC | PRN

## 2018-04-01 MED ORDER — SODIUM CHLORIDE 0.9 % IV SOLN: 250.0000 mL | INTRAVENOUS | Status: DC

## 2018-04-01 MED ORDER — TORSEMIDE 20 MG PO TABS
40.0000 mg | ORAL_TABLET | Freq: Every day | ORAL | Status: DC
  Administered 2018-04-01 – 2018-04-04 (×4): 40 mg via ORAL
  Filled 2018-04-01 (×4): qty 2

## 2018-04-01 MED ORDER — HYDROCORTISONE 1 % EX CREA
1.0000 "application " | TOPICAL_CREAM | Freq: Three times a day (TID) | CUTANEOUS | Status: DC | PRN
  Filled 2018-04-01: qty 28

## 2018-04-01 NOTE — H&P (View-Only) (Signed)
Patient ID: David Huynh, male   DOB: 10/27/1940, 77 y.o.   MRN: [id number]     Advanced Heart Failure Rounding Note  PCP-Cardiologist: David Harding, MD   Subjective:    Underwent DC-CV on 11/15. Was successful but reverted back to AF soon afterward.   Remains on IV amio. Ranexa added 11/16.   Creatinine down some 1.95 => 1.86.  Diuretics stopped yesterday. CVP 9 today with co-ox 62%.   No dyspnea walking.  HR in 110s currently.   Objective:   Weight Range: 114.6 kg Body mass index is 34.27 kg/m.   Vital Signs:   Temp:  [97.5 F (36.4 C)-98 F (36.7 C)] 97.6 F (36.4 C) (11/18 0726) Pulse Rate:  [62-113] 85 (11/18 0726) Resp:  [15-21] 17 (11/18 0649) BP: (103-133)/(39-103) 133/92 (11/18 0726) SpO2:  [93 %-99 %] 95 % (11/18 0726) Weight:  [114.6 kg] 114.6 kg (11/18 0649) Last BM Date: 03/29/18  Weight change: Filed Weights   03/30/18 0503 03/31/18 0433 04/01/18 0649  Weight: 117.8 kg 115.3 kg 114.6 kg    Intake/Output:   Intake/Output Summary (Last 24 hours) at 04/01/2018 0746 Last data filed at 04/01/2018 0556 Gross per 24 hour  Intake 1356.81 ml  Output 225 ml  Net 1131.81 ml      Physical Exam    General: NAD Neck: JVP 9-10 cm, no thyromegaly or thyroid nodule.  Lungs: Clear to auscultation bilaterally with normal respiratory effort. CV: Nondisplaced PMI.  Heart mildly tachy, irregular S1/S2, no S3/S4, no murmur.  1+ ankle edema.   Abdomen: Soft, nontender, no hepatosplenomegaly, no distention.  Skin: Intact without lesions or rashes.  Neurologic: Alert and oriented x 3.  Psych: Normal affect. Extremities: No clubbing or cyanosis.  HEENT: Normal.    Telemetry   Afib 110s, personally reviewed.   EKG    No new tracings.    Labs    CBC Recent Labs    03/31/18 0513 04/01/18 0500  WBC 8.6 10.2  NEUTROABS 5.4 6.5  HGB 14.6 14.6  HCT 45.9 47.3  MCV 90.0 90.8  PLT 226 226   Basic Metabolic Panel Recent Labs    03/31/18 0513  04/01/18 0500  NA 136 136  K 4.2 4.1  CL 91* 95*  CO2 32 31  GLUCOSE 147* 144*  BUN 30* 38*  CREATININE 1.95* 1.86*  CALCIUM 9.3 9.0  MG 2.4 2.5*   Liver Function Tests No results for input(s): AST, ALT, ALKPHOS, BILITOT, PROT, ALBUMIN in the last 72 hours. No results for input(s): LIPASE, AMYLASE in the last 72 hours. Cardiac Enzymes No results for input(s): CKTOTAL, CKMB, CKMBINDEX, TROPONINI in the last 72 hours.  BNP: BNP (last 3 results) Recent Labs    03/25/18 1225  BNP 381.9*    ProBNP (last 3 results) No results for input(s): PROBNP in the last 8760 hours.   D-Dimer No results for input(s): DDIMER in the last 72 hours. Hemoglobin A1C No results for input(s): HGBA1C in the last 72 hours. Fasting Lipid Panel No results for input(s): CHOL, HDL, LDLCALC, TRIG, CHOLHDL, LDLDIRECT in the last 72 hours. Thyroid Function Tests No results for input(s): TSH, T4TOTAL, T3FREE, THYROIDAB in the last 72 hours.  Invalid input(s): FREET3  Other results:   Imaging    No results found.   Medications:     Scheduled Medications: . apixaban  5 mg Oral BID  . atorvastatin  80 mg Oral q1800  . digoxin  0.125 mg Oral Daily  .   glimepiride  1 mg Oral BID  . insulin aspart  0-9 Units Subcutaneous TID WC  . loratadine  10 mg Oral Daily  . mometasone-formoterol  2 puff Inhalation BID  . ranolazine  500 mg Oral BID  . sodium chloride flush  3 mL Intravenous Q12H  . sodium chloride flush  3 mL Intravenous Q12H  . spironolactone  25 mg Oral Daily  . torsemide  40 mg Oral Daily    Infusions: . sodium chloride    . sodium chloride    . amiodarone 30 mg/hr (04/01/18 0556)    PRN Medications: sodium chloride, acetaminophen, levalbuterol, nitroGLYCERIN, ondansetron (ZOFRAN) IV, sodium chloride flush, sodium chloride flush    Patient Profile   David Huynh is a 77 y.o. male with a history of CAD, chronic combined HF, HTN, HLD, DM, CKD 3, and asthma.  He  presented to [facility] with SOB. Found to be in afib RVR. Repeat echo showed reduced EF to 35-40% (prior echo EF 55-60%).  Assessment/Plan   1. Acute systolic CHF: Prior ischemic cardiomyopathy, improved after PCI.  Echo this admission with EF back down to 35-40% and moderate-severe RV dysfunction.  He has had no chest pain, only dyspnea.  TnI only slightly elevated around 0.04 with no trend.  Cannot rule out worsening coronary disease, but do not think acute or recent ACS.  It is possible that this is a tachycardia-mediated CMP (also has significant RV dysfunction), not sure how long he has been in atrial fibrillation as he does not feel palpitations.  Will need cath in 4 weeks or so when we can stop AC safely. Co-ox better at 62%, CVP 9.  Creatinine down to 1.86.  - Can start torsemide 40 mg daily today.  - Continue digoxin 0.125 mg daily.  - Continue spironolactone 25 mg daily.  - No ACEi/ARB/Entresto yet with elevated creatinine. No b-blocker with low output - Continue TED hose 2. Atrial fibrillation: Noted for the first time this admission.  Not sure how long he has been in afib as he does not feel palpitations. Has been short of breath for around 3 wks.  As above, concerned for possible tachy-mediated CMP.  Had successful DC-CV on 11/15. Now back in AF.  On amiodarone gtt + ranolazine.  - Continue amiodarone gtt. Ranexa added.   - Will repeat DCCV today.  Discussed risks/benefits with patient and he agrees to procedure.  - Eliquis 5 mg BID - Will eventually need EP input regarding RFA.  3. AKI: ?Baseline. Creatinine 1.5->1.7->1.95 -> 1.86.  4. CAD: History of DES to RCA in 9/17.  This admission, TnI mildly elevated at 0.04 with no trend => suspect demand ischemia with volume overload.  As above, doubt ACS but cannot rule out worsening of CAD over the last couple of years.   - No ASA with stable CAD on Eliquis - Continue statin.  - As above, should have cath eventually given fall in EF.   However, with rise in creatinine and no chest pain, will hold off acutely. Plan to wait 1 month post-DCCV then can hold anticoagulation for [facility].  6. DM2: Ideally would get off glimepiride with CAD and switch to metformin +/- SGLT2 once renal function more stable.   Medication concerns reviewed with patient and pharmacy team. Barriers identified: None at this time.   Length of Stay: 6  Aubry Rankin, MD  04/01/2018, 7:46 AM  Advanced Heart Failure Team Pager [phone] (M-F; 7a - 4p)  Please   contact [facility] for night-coverage after hours (4p -7a ) and weekends on [email or web address]   

## 2018-04-01 NOTE — Anesthesia Postprocedure Evaluation (Signed)
Anesthesia Post Note  Patient: David Huynh  Procedure(s) Performed: TRANSESOPHAGEAL ECHOCARDIOGRAM (TEE) (N/A ) CARDIOVERSION (N/A )     Patient location during evaluation: PACU Anesthesia Type: General Level of consciousness: awake and alert Pain management: pain level controlled Vital Signs Assessment: post-procedure vital signs reviewed and stable Respiratory status: spontaneous breathing, nonlabored ventilation and respiratory function stable Cardiovascular status: blood pressure returned to baseline and stable Postop Assessment: no apparent nausea or vomiting Anesthetic complications: no    Last Vitals:  Vitals:   04/01/18 0649 04/01/18 0726  BP: 115/81 (!) 133/92  Pulse: (!) 102 85  Resp: 17   Temp: 36.7 C 36.4 C  SpO2: 97% 95%    Last Pain:  Vitals:   04/01/18 0726  TempSrc: Oral  PainSc:    Pain Goal: Patients Stated Pain Goal: 0 (03/29/18 2010)               Lucretia Kern

## 2018-04-01 NOTE — Progress Notes (Signed)
Patient ID: David Huynh, male   DOB: November 05, 1940, 77 y.o.   MRN: [id number]     Advanced Heart Failure Rounding Note  PCP-Cardiologist: [name], MD   Subjective:    Underwent DC-CV on 11/15. Was successful but reverted back to AF soon afterward.   Remains on IV amio. Ranexa added 11/16.   Creatinine down some 1.95 => 1.86.  Diuretics stopped yesterday. CVP 9 today with co-ox 62%.   No dyspnea walking.  HR in 110s currently.   Objective:   Weight Range: 114.6 kg Body mass index is 34.27 kg/m.   Vital Signs:   Temp:  [97.5 F (36.4 C)-98 F (36.7 C)] 97.6 F (36.4 C) (11/18 0726) Pulse Rate:  [62-113] 85 (11/18 0726) Resp:  [15-21] 17 (11/18 0649) BP: (103-133)/(39-103) 133/92 (11/18 0726) SpO2:  [93 %-99 %] 95 % (11/18 0726) Weight:  [114.6 kg] 114.6 kg (11/18 0649) Last BM Date: 03/29/18  Weight change: Filed Weights   03/30/18 0503 03/31/18 0433 04/01/18 0649  Weight: 117.8 kg 115.3 kg 114.6 kg    Intake/Output:   Intake/Output Summary (Last 24 hours) at 04/01/2018 0746 Last data filed at 04/01/2018 0556 Gross per 24 hour  Intake 1356.81 ml  Output 225 ml  Net 1131.81 ml      Physical Exam    General: NAD Neck: JVP 9-10 cm, no thyromegaly or thyroid nodule.  Lungs: Clear to auscultation bilaterally with normal respiratory effort. CV: Nondisplaced PMI.  Heart mildly tachy, irregular S1/S2, no S3/S4, no murmur.  1+ ankle edema.   Abdomen: Soft, nontender, no hepatosplenomegaly, no distention.  Skin: Intact without lesions or rashes.  Neurologic: Alert and oriented x 3.  Psych: Normal affect. Extremities: No clubbing or cyanosis.  HEENT: Normal.    Telemetry   Afib 110s, personally reviewed.   EKG    No new tracings.    Labs    CBC Recent Labs    03/31/18 0513 04/01/18 0500  WBC 8.6 10.2  NEUTROABS 5.4 6.5  HGB 14.6 14.6  HCT 45.9 47.3  MCV 90.0 90.8  PLT 226 226   Basic Metabolic Panel Recent Labs    40/98/11 0513  04/01/18 0500  NA 136 136  K 4.2 4.1  CL 91* 95*  CO2 32 31  GLUCOSE 147* 144*  BUN 30* 38*  CREATININE 1.95* 1.86*  CALCIUM 9.3 9.0  MG 2.4 2.5*   Liver Function Tests No results for input(s): AST, ALT, ALKPHOS, BILITOT, PROT, ALBUMIN in the last 72 hours. No results for input(s): LIPASE, AMYLASE in the last 72 hours. Cardiac Enzymes No results for input(s): CKTOTAL, CKMB, CKMBINDEX, TROPONINI in the last 72 hours.  BNP: BNP (last 3 results) Recent Labs    03/25/18 1225  BNP 381.9*    ProBNP (last 3 results) No results for input(s): PROBNP in the last 8760 hours.   D-Dimer No results for input(s): DDIMER in the last 72 hours. Hemoglobin A1C No results for input(s): HGBA1C in the last 72 hours. Fasting Lipid Panel No results for input(s): CHOL, HDL, LDLCALC, TRIG, CHOLHDL, LDLDIRECT in the last 72 hours. Thyroid Function Tests No results for input(s): TSH, T4TOTAL, T3FREE, THYROIDAB in the last 72 hours.  Invalid input(s): FREET3  Other results:   Imaging    No results found.   Medications:     Scheduled Medications: . apixaban  5 mg Oral BID  . atorvastatin  80 mg Oral q1800  . digoxin  0.125 mg Oral Daily  .  glimepiride  1 mg Oral BID  . insulin aspart  0-9 Units Subcutaneous TID WC  . loratadine  10 mg Oral Daily  . mometasone-formoterol  2 puff Inhalation BID  . ranolazine  500 mg Oral BID  . sodium chloride flush  3 mL Intravenous Q12H  . sodium chloride flush  3 mL Intravenous Q12H  . spironolactone  25 mg Oral Daily  . torsemide  40 mg Oral Daily    Infusions: . sodium chloride    . sodium chloride    . amiodarone 30 mg/hr (04/01/18 0556)    PRN Medications: sodium chloride, acetaminophen, levalbuterol, nitroGLYCERIN, ondansetron (ZOFRAN) IV, sodium chloride flush, sodium chloride flush    Patient Profile   David Huynh is a 77 y.o. male with a history of CAD, chronic combined HF, HTN, HLD, DM, CKD 3, and asthma.  He  presented to [facility] with SOB. Found to be in afib RVR. Repeat echo showed reduced EF to 35-40% (prior echo EF 55-60%).  Assessment/Plan   1. Acute systolic CHF: Prior ischemic cardiomyopathy, improved after PCI.  Echo this admission with EF back down to 35-40% and moderate-severe RV dysfunction.  He has had no chest pain, only dyspnea.  TnI only slightly elevated around 0.04 with no trend.  Cannot rule out worsening coronary disease, but do not think acute or recent ACS.  It is possible that this is a tachycardia-mediated CMP (also has significant RV dysfunction), not sure how long he has been in atrial fibrillation as he does not feel palpitations.  Will need cath in 4 weeks or so when we can stop AC safely. Co-ox better at 62%, CVP 9.  Creatinine down to 1.86.  - Can start torsemide 40 mg daily today.  - Continue digoxin 0.125 mg daily.  - Continue spironolactone 25 mg daily.  - No ACEi/ARB/Entresto yet with elevated creatinine. No b-blocker with low output - Continue TED hose 2. Atrial fibrillation: Noted for the first time this admission.  Not sure how long he has been in afib as he does not feel palpitations. Has been short of breath for around 3 wks.  As above, concerned for possible tachy-mediated CMP.  Had successful DC-CV on 11/15. Now back in AF.  On amiodarone gtt + ranolazine.  - Continue amiodarone gtt. Ranexa added.   - Will repeat DCCV today.  Discussed risks/benefits with patient and he agrees to procedure.  - Eliquis 5 mg BID - Will eventually need EP input regarding RFA.  3. AKI: ?Baseline. Creatinine 1.5->1.7->1.95 -> 1.86.  4. CAD: History of DES to RCA in 9/17.  This admission, TnI mildly elevated at 0.04 with no trend => suspect demand ischemia with volume overload.  As above, doubt ACS but cannot rule out worsening of CAD over the last couple of years.   - No ASA with stable CAD on Eliquis - Continue statin.  - As above, should have cath eventually given fall in EF.   However, with rise in creatinine and no chest pain, will hold off acutely. Plan to wait 1 month post-DCCV then can hold anticoagulation for [facility].  6. DM2: Ideally would get off glimepiride with CAD and switch to metformin +/- SGLT2 once renal function more stable.   Medication concerns reviewed with patient and pharmacy team. Barriers identified: None at this time.   Length of Stay: 6  [name], MD  04/01/2018, 7:46 AM  Advanced Heart Failure Team Pager [phone] (M-F; 7a - 4p)  Please  contact [facility] for night-coverage after hours (4p -7a ) and weekends on [email or web address]

## 2018-04-01 NOTE — Anesthesia Preprocedure Evaluation (Addendum)
Anesthesia Evaluation  Patient identified by MRN, date of birth, ID band Patient awake    Reviewed: Allergy & Precautions, NPO status , Patient's Chart, lab work & pertinent test results  Airway Mallampati: II  TM Distance: >3 FB Neck ROM: Full    Dental  (+) Dental Advisory Given   Pulmonary asthma ,    breath sounds clear to auscultation       Cardiovascular hypertension, Pt. on medications and Pt. on home beta blockers + CAD, + Cardiac Stents and +CHF  + dysrhythmias Atrial Fibrillation  Rhythm:Irregular Rate:Tachycardia  TTE 03/26/18: Study Conclusions  - Left ventricle: The cavity size was normal. Wall thickness was  increased in a pattern of mild LVH. Systolic function was  moderately reduced. The estimated ejection fraction was in the range of 35% to 40%. Moderate diffuse hypokinesis with no  identifiable regional variations. Atrial fibrillation lessens the specificty of the findings, but Doppler parameters are consistentwith restrictive physiology, indicative of decreased leftventricular diastolic compliance and/or increased left atrialpressure. Acoustic contrast opacification revealed no evidence ofthrombus. - Mitral valve: Calcified annulus. There was moderate regurgitation directed centrally. - Left atrium: The atrium was severely dilated. - Right ventricle: The cavity size was moderately to severely dilated. Systolic function was moderately to severely reduced. - Right atrium: The atrium was severely dilated. - Pulmonary arteries: Systolic pressure was mildly increased. PA  peak pressure: 38 mm Hg (S).   Neuro/Psych negative neurological ROS  negative psych ROS   GI/Hepatic negative GI ROS, Neg liver ROS,   Endo/Other  diabetes, Oral Hypoglycemic Agents  Renal/GU Renal InsufficiencyRenal disease  negative genitourinary   Musculoskeletal negative musculoskeletal ROS (+)   Abdominal   Peds   Hematology negative hematology ROS (+)   Anesthesia Other Findings   Reproductive/Obstetrics                            Anesthesia Physical  Anesthesia Plan  ASA: III  Anesthesia Plan: General   Post-op Pain Management:    Induction: Intravenous  PONV Risk Score and Plan: 2 and Treatment may vary due to age or medical condition  Airway Management Planned: Mask and Natural Airway  Additional Equipment: None  Intra-op Plan:   Post-operative Plan:   Informed Consent: I have reviewed the patients History and Physical, chart, labs and discussed the procedure including the risks, benefits and alternatives for the proposed anesthesia with the patient or authorized representative who has indicated his/her understanding and acceptance.   Dental advisory given  Plan Discussed with: CRNA  Anesthesia Plan Comments:        Anesthesia Quick Evaluation

## 2018-04-02 ENCOUNTER — Inpatient Hospital Stay (HOSPITAL_COMMUNITY): Payer: Medicare Other | Admitting: Anesthesiology

## 2018-04-02 ENCOUNTER — Encounter (HOSPITAL_COMMUNITY)
Admission: EM | Disposition: A | Discharge status: Home / Self Care | Payer: Self-pay | Source: Home / Self Care | Attending: Cardiology

## 2018-04-02 ENCOUNTER — Telehealth: Payer: Self-pay

## 2018-04-02 ENCOUNTER — Encounter (HOSPITAL_COMMUNITY): Payer: Self-pay

## 2018-04-02 DIAGNOSIS — I4891 Unspecified atrial fibrillation: Secondary | ICD-10-CM

## 2018-04-02 HISTORY — PX: CARDIOVERSION: SHX1299

## 2018-04-02 LAB — GLUCOSE, CAPILLARY
Glucose-Capillary: 113 mg/dL — ABNORMAL HIGH (ref 70–99)
Glucose-Capillary: 213 mg/dL — ABNORMAL HIGH (ref 70–99)
Glucose-Capillary: 115 mg/dL — ABNORMAL HIGH (ref 70–99)
Glucose-Capillary: 112 mg/dL — ABNORMAL HIGH (ref 70–99)

## 2018-04-02 LAB — BASIC METABOLIC PANEL
ANION GAP: 12 (ref 5–15)
BUN: 36 mg/dL — AB (ref 8–23)
CO2: 30 mmol/L (ref 22–32)
CREATININE: 1.88 mg/dL — AB (ref 0.61–1.24)
Calcium: 9 mg/dL (ref 8.9–10.3)
Chloride: 94 mmol/L — ABNORMAL LOW (ref 98–111)
GFR calc Af Amer: 38 mL/min — ABNORMAL LOW (ref >60)
GFR, EST NON AFRICAN AMERICAN: 33 mL/min — AB (ref >60)
Glucose, Bld: 141 mg/dL — ABNORMAL HIGH (ref 70–99)
Potassium: 3.8 mmol/L (ref 3.5–5.1)
Sodium: 136 mmol/L (ref 135–145)

## 2018-04-02 LAB — COOXEMETRY PANEL
CARBOXYHEMOGLOBIN: 1.3 % (ref 0.5–1.5)
METHEMOGLOBIN: 1.6 % — AB (ref 0.0–1.5)
O2 Saturation: 58.2 %
TOTAL HEMOGLOBIN: 14.9 g/dL (ref 12.0–16.0)

## 2018-04-02 LAB — CBC WITH DIFFERENTIAL/PLATELET
Abs Immature Granulocytes: 0.05 10*3/uL (ref 0.00–0.07)
BASOS PCT: 1 %
Basophils Absolute: 0.1 10*3/uL (ref 0.0–0.1)
EOS ABS: 0.2 10*3/uL (ref 0.0–0.5)
EOS PCT: 2 %
HEMATOCRIT: 46.4 % (ref 39.0–52.0)
Hemoglobin: 14.5 g/dL (ref 13.0–17.0)
Immature Granulocytes: 1 %
Lymphocytes Relative: 22 %
Lymphs Abs: 2.1 10*3/uL (ref 0.7–4.0)
MCH: 28.2 pg (ref 26.0–34.0)
MCHC: 31.3 g/dL (ref 30.0–36.0)
MCV: 90.3 fL (ref 80.0–100.0)
MONOS PCT: 11 %
Monocytes Absolute: 1.1 10*3/uL — ABNORMAL HIGH (ref 0.1–1.0)
NEUTROS PCT: 63 %
Neutro Abs: 6.3 10*3/uL (ref 1.7–7.7)
PLATELETS: 207 10*3/uL (ref 150–400)
RBC: 5.14 MIL/uL (ref 4.22–5.81)
RDW: 13 % (ref 11.5–15.5)
WBC: 9.8 10*3/uL (ref 4.0–10.5)
nRBC: 0 % (ref 0.0–0.2)

## 2018-04-02 LAB — MAGNESIUM: MAGNESIUM: 2.5 mg/dL — AB (ref 1.7–2.4)

## 2018-04-02 SURGERY — CARDIOVERSION
Anesthesia: General

## 2018-04-02 MED ORDER — SODIUM CHLORIDE 0.9 % IV SOLN: INTRAVENOUS | Status: DC

## 2018-04-02 MED ORDER — CARVEDILOL 3.125 MG PO TABS
3.1250 mg | ORAL_TABLET | Freq: Two times a day (BID) | ORAL | Status: DC
  Administered 2018-04-02 – 2018-04-03 (×3): 3.125 mg via ORAL
  Filled 2018-04-02 (×3): qty 1

## 2018-04-02 MED ORDER — ASPIRIN 81 MG PO CHEW
81.0000 mg | CHEWABLE_TABLET | ORAL | Status: AC
  Administered 2018-04-03: 81 mg via ORAL
  Filled 2018-04-02: qty 1

## 2018-04-02 MED ORDER — PROPOFOL 10 MG/ML IV BOLUS
INTRAVENOUS | Status: DC | PRN
  Administered 2018-04-02: 10 mg via INTRAVENOUS
  Administered 2018-04-02: 60 mg via INTRAVENOUS
  Administered 2018-04-02: 20 mg via INTRAVENOUS

## 2018-04-02 MED ORDER — LIDOCAINE 2% (20 MG/ML) 5 ML SYRINGE
INTRAMUSCULAR | Status: DC | PRN
  Administered 2018-04-02: 20 mg via INTRAVENOUS

## 2018-04-02 MED ORDER — SODIUM CHLORIDE 0.9% FLUSH
3.0000 mL | Freq: Two times a day (BID) | INTRAVENOUS | Status: DC
  Administered 2018-04-02: 3 mL via INTRAVENOUS

## 2018-04-02 MED ORDER — SODIUM CHLORIDE 0.9 % IV SOLN: 250.0000 mL | INTRAVENOUS | Status: DC | PRN

## 2018-04-02 MED ORDER — SODIUM CHLORIDE 0.9% FLUSH: 3.0000 mL | INTRAVENOUS | Status: DC | PRN

## 2018-04-02 NOTE — Progress Notes (Signed)
Patient ID: David Huynh, male   DOB: 09-04-1940, 77 y.o.   MRN: [id number]     Advanced Heart Failure Rounding Note  PCP-Cardiologist: [name], MD   Subjective:    Underwent DC-CV on 11/15. Was successful but reverted back to AF soon afterward.  DCCV today but did not convert to NSR at all.   Remains on IV amiodarone. Ranexa added 11/16. HR in 100s.   Creatinine 1.95 => 1.86 => 1.88.  He is on po torsemide with CVP 9-10 and co-ox 58%.    No dyspnea walking.     Objective:   Weight Range: 113.5 kg Body mass index is 33.95 kg/m.   Vital Signs:   Temp:  [97.4 F (36.3 C)-98.5 F (36.9 C)] 98.5 F (36.9 C) (11/19 0750) Pulse Rate:  [45-195] 82 (11/19 0750) Resp:  [9-27] 17 (11/19 0750) BP: (98-137)/(59-99) 125/99 (11/19 0750) SpO2:  [92 %-100 %] 92 % (11/19 0750) Weight:  [113.5 kg] 113.5 kg (11/19 0454) Last BM Date: 04/01/18  Weight change: Filed Weights   03/31/18 0433 04/01/18 0649 04/02/18 0454  Weight: 115.3 kg 114.6 kg 113.5 kg    Intake/Output:   Intake/Output Summary (Last 24 hours) at 04/02/2018 0754 Last data filed at 04/02/2018 0750 Gross per 24 hour  Intake 1181.21 ml  Output 300 ml  Net 881.21 ml      Physical Exam    General: NAD Neck: JVP 9-10 cm, no thyromegaly or thyroid nodule.  Lungs: Clear to auscultation bilaterally with normal respiratory effort. CV: Nondisplaced PMI.  Heart mildly tachy, irregular S1/S2, no S3/S4, no murmur.  1+ ankle edema.   Abdomen: Soft, nontender, no hepatosplenomegaly, no distention.  Skin: Intact without lesions or rashes.  Neurologic: Alert and oriented x 3.  Psych: Normal affect. Extremities: No clubbing or cyanosis.  HEENT: Normal.    Telemetry   Afib 110s, personally reviewed.   EKG    No new tracings.    Labs    CBC Recent Labs    04/01/18 0500 04/02/18 0434  WBC 10.2 9.8  NEUTROABS 6.5 6.3  HGB 14.6 14.5  HCT 47.3 46.4  MCV 90.8 90.3  PLT 226 207   Basic Metabolic  Panel Recent Labs    04/01/18 0500 04/02/18 0434  NA 136 136  K 4.1 3.8  CL 95* 94*  CO2 31 30  GLUCOSE 144* 141*  BUN 38* 36*  CREATININE 1.86* 1.88*  CALCIUM 9.0 9.0  MG 2.5* 2.5*   Liver Function Tests No results for input(s): AST, ALT, ALKPHOS, BILITOT, PROT, ALBUMIN in the last 72 hours. No results for input(s): LIPASE, AMYLASE in the last 72 hours. Cardiac Enzymes No results for input(s): CKTOTAL, CKMB, CKMBINDEX, TROPONINI in the last 72 hours.  BNP: BNP (last 3 results) Recent Labs    03/25/18 1225  BNP 381.9*    ProBNP (last 3 results) No results for input(s): PROBNP in the last 8760 hours.   D-Dimer No results for input(s): DDIMER in the last 72 hours. Hemoglobin A1C No results for input(s): HGBA1C in the last 72 hours. Fasting Lipid Panel No results for input(s): CHOL, HDL, LDLCALC, TRIG, CHOLHDL, LDLDIRECT in the last 72 hours. Thyroid Function Tests No results for input(s): TSH, T4TOTAL, T3FREE, THYROIDAB in the last 72 hours.  Invalid input(s): FREET3  Other results:   Imaging    No results found.   Medications:     Scheduled Medications: . [MAR Hold] apixaban  5 mg Oral BID  . [  MAR Hold] atorvastatin  80 mg Oral q1800  . carvedilol  3.125 mg Oral BID WC  . [MAR Hold] digoxin  0.125 mg Oral Daily  . [MAR Hold] glimepiride  1 mg Oral BID  . [MAR Hold] insulin aspart  0-9 Units Subcutaneous TID WC  . [MAR Hold] loratadine  10 mg Oral Daily  . [MAR Hold] mometasone-formoterol  2 puff Inhalation BID  . [MAR Hold] ranolazine  500 mg Oral BID  . [MAR Hold] sodium chloride flush  3 mL Intravenous Q12H  . [MAR Hold] sodium chloride flush  3 mL Intravenous Q12H  . [MAR Hold] spironolactone  25 mg Oral Daily  . [MAR Hold] torsemide  40 mg Oral Daily    Infusions: . [MAR Hold] sodium chloride    . sodium chloride    . sodium chloride    . amiodarone 30 mg/hr (04/02/18 0600)    PRN Medications: [MAR Hold] sodium chloride, [MAR Hold]  acetaminophen, [MAR Hold] hydrocortisone cream, [MAR Hold] levalbuterol, [MAR Hold] nitroGLYCERIN, [MAR Hold] ondansetron (ZOFRAN) IV, [MAR Hold] sodium chloride flush, [MAR Hold] sodium chloride flush    Patient Profile   David Huynh is a 77 y.o. male with a history of CAD, chronic combined HF, HTN, HLD, DM, CKD 3, and asthma.  He presented to [facility] with SOB. Found to be in afib RVR. Repeat echo showed reduced EF to 35-40% (prior echo EF 55-60%).  Assessment/Plan   1. Acute systolic CHF: Prior ischemic cardiomyopathy, improved after PCI.  Echo this admission with EF back down to 35-40% and moderate-severe RV dysfunction.  He has had no chest pain, only dyspnea.  TnI only slightly elevated around 0.04 with no trend.  Cannot rule out worsening coronary disease, but do not think acute or recent ACS.  It is possible that this is a tachycardia-mediated CMP (also has significant RV dysfunction), not sure how long he has been in atrial fibrillation as he does not feel palpitations.  Will need cath in 4 weeks or so when we can stop AC safely. Co-ox relatively stable at 58%, CVP 9-10.  Creatinine 1.88. - Continue torsemide 40 mg daily.   - Continue digoxin 0.125 mg daily.  - Continue spironolactone 25 mg daily.  - No ACEi/ARB/Entresto yet with elevated creatinine.  - Add low dose Coreg 3.125 mg bid.  - Continue TED hose - Need to get him out of atrial fibrillation ideally but failed DCCV x 2 now.  2. Atrial fibrillation: Noted for the first time this admission.  Not sure how long he has been in afib as he does not feel palpitations. Has been short of breath for around 3 wks.  As above, concerned for possible tachy-mediated CMP, HR still 100s-110s.  Had successful DC-CV on 11/15 but then back in atrial fibrillation.  Ranolazine added, but failed DCCV again today.  - Currently on ranolazine + amiodarone.  Will ask EP to weigh in at this point.  With difficulty with rate control, may end  up needing ablation/BiV pacing. Suspect LA may be too large to expect successful atrial fibrillation ablation.  - Eliquis 5 mg BID 3. AKI: ?Baseline. Creatinine 1.5->1.7->1.95 -> 1.86 => 1.88.  4. CAD: History of DES to RCA in 9/17.  This admission, TnI mildly elevated at 0.04 with no trend => suspect demand ischemia with volume overload.  As above, doubt ACS but cannot rule out worsening of CAD over the last couple of years.   - No ASA with  stable CAD on Eliquis - Continue statin.  - As above, should have cath eventually given fall in EF.  However, with rise in creatinine and no chest pain, will hold off acutely. Plan to wait 1 month post-DCCV then can hold anticoagulation for [facility].  6. DM2: Ideally would get off glimepiride with CAD and switch to metformin +/- SGLT2 once renal function more stable.   Medication concerns reviewed with patient and pharmacy team. Barriers identified: None at this time.   Length of Stay: 7  [name], MD  04/02/2018, 7:54 AM  Advanced Heart Failure Team Pager [phone] (M-F; 7a - 4p)  Please contact [facility] for night-coverage after hours (4p -7a ) and weekends on [email or web address]

## 2018-04-02 NOTE — Anesthesia Postprocedure Evaluation (Signed)
Anesthesia Post Note  Patient: David Huynh  Procedure(s) Performed: CARDIOVERSION (N/A )     Patient location during evaluation: PACU Anesthesia Type: General Level of consciousness: awake and alert Pain management: pain level controlled Vital Signs Assessment: post-procedure vital signs reviewed and stable Respiratory status: spontaneous breathing, nonlabored ventilation, respiratory function stable and patient connected to nasal cannula oxygen Cardiovascular status: blood pressure returned to baseline and stable Postop Assessment: no apparent nausea or vomiting Anesthetic complications: no    Last Vitals:  Vitals:   04/02/18 0847 04/02/18 1247  BP: (!) 117/93 114/81  Pulse: 96 (!) 108  Resp: 18   Temp:  36.7 C  SpO2: 99% 95%    Last Pain:  Vitals:   04/02/18 1247  TempSrc: Oral  PainSc:                  Kennieth Rad

## 2018-04-02 NOTE — Anesthesia Procedure Notes (Signed)
Procedure Name: General with mask airway Date/Time: 04/02/2018 7:38 AM Performed by: White, Cordella Register, CRNA Pre-anesthesia Checklist: Patient identified, Emergency Drugs available, Suction available, Patient being monitored and Timeout performed Patient Re-evaluated:Patient Re-evaluated prior to induction Oxygen Delivery Method: Ambu bag Preoxygenation: Pre-oxygenation with 100% oxygen Induction Type: IV induction

## 2018-04-02 NOTE — Consult Note (Addendum)
ELECTROPHYSIOLOGY CONSULT NOTE    Patient ID: David Huynh MRN: [id number], DOB/AGE: Mar 03, 1941 77 y.o.  Admit date: 03/25/2018 Date of Consult: 04/02/2018  Primary Physician: Bradd Canary, MD Primary Cardiologist: Herbie [place] Heart Failure: Shirlee Latch Electrophysiologist: Etai Copado (new this admission)  Patient Profile: David Huynh is a 77 y.o. male with a history of CAD, chronic combined heart failure, HTN, diabetes, CKD, and asthma who is being seen today for the evaluation of AF at the request of Dr Shirlee Latch.  HPI:  David Huynh is a 77 y.o. male with the above past medical history. He has known CAD with a prior cardiomyopathy that resolved post intervention.  Since then, he has done fairly well without significant cardiac issues. 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 He reports that he developed congestion and cough a couple of weeks ago that he attributed to allergies as the symptoms were similar to what he has had in the past.  He was out of his albuterol inhaler and got Primatine mist over the counter for congestion.  His symptoms of shortness of breath worsened and he presented to the ER for further evaluation.  He was found to be in AF with RVR, acute on chronic systolic heart failure with recurrently depressed EF.  He underwent cardioversion and maintained SR for several hours but then reverted back to AF. He has been loaded with IV amiodarone and underwent repeat cardioversion today which failed to terminate atrial fibrillation. EP has been asked to evaluate for treatment options.  Echo this admission demonstrated EF 35-40%, moderate diffuse hypokinesis, RV moderately to severely dilated, LA 55.   He feels well today and denies chest pain, palpitations, dyspnea, PND, orthopnea, nausea, vomiting, dizziness, syncope, edema, weight gain, or early satiety. He never had  palpitations.   Past Medical History:  Diagnosis Date  . Allergy    seasonal  . Asthma    mild, intermittent  . CAD, multiple vessel    Three-vessel disease involving mid RCA 85% (PCI with DES), OM 2 80% in branch and ostD1 ~90%. Only the mid RCA with PCI target.   . Chronic combined systolic and diastolic CHF (congestive heart failure) (HCC)   . CKD (chronic kidney disease), stage III (HCC)   . Diabetes mellitus    type 2  . Fracture of multiple ribs 02/12/2015  . Hyperlipidemia   . Hypertension   . Hypocalcemia 02/12/2015  . Liver function study, abnormal 04/26/2011  . Multiple rib fractures   . Obesity   . Persistent atrial fibrillation      Surgical History:  Past Surgical History:  Procedure Laterality Date  . APPENDECTOMY  1975  . CARDIAC CATHETERIZATION N/A 02/17/2016   Procedure: Left Heart Cath and Coronary Angiography;  Surgeon: Marykay Lex, MD;  Location: [facility] INVASIVE CV LAB;  Service: Cardiovascular: mRCA 85% (PCI), branch of OM2 80%,  ostD1 90%; EF 35-45%  . CARDIAC CATHETERIZATION N/A 02/17/2016   Procedure: Coronary Stent Intervention;  Surgeon: Marykay Lex, MD;  Location: [facility] INVASIVE CV LAB;  Service: Cardiovascular: mRCA PCI : STENT PROMUS PREM MR 3.5X28 DES  . CARDIOVERSION N/A 03/29/2018   Procedure: CARDIOVERSION;  Surgeon: Laurey Morale, MD;  Location: [facility] ENDOSCOPY;  Service: Cardiovascular;  Laterality: N/A;  . CATARACT EXTRACTION  2010   b/l  . RETINAL LASER PROCEDURE  1985  . TEE WITHOUT CARDIOVERSION N/A 03/29/2018  Procedure: TRANSESOPHAGEAL ECHOCARDIOGRAM (TEE);  Surgeon: Laurey Morale, MD;  Location: [facility] ENDOSCOPY;  Service: Cardiovascular;  Laterality: N/A;  . TONSILLECTOMY AND ADENOIDECTOMY  1947  . TRANSTHORACIC ECHOCARDIOGRAM  02/2016   Mild LVH. Moderately reduced EF of 35-40% with diffuse hypokinesis. Indeterminate diastolic function. Likely elevated LA pressures. Mild aortic stenosis (mean gradient 10 mmHg). Mild MR. Mild LA  dilation. Mild to moderate PA hypertension with 39 mmHg  . VIDEO ASSISTED THORACOSCOPY (VATS)/THOROCOTOMY Right 01/24/2015   Procedure: VIDEO ASSISTED THORACOSCOPY (VATS) FOR DRAINAGE OF RIGHT HEMOTHORAX;  Surgeon: Alleen Borne, MD;  Location: [facility] OR;  Service: Thoracic;  Laterality: Right;     Medications Prior to Admission  Medication Sig Dispense Refill Last Dose  . acetaminophen (TYLENOL) 500 MG tablet Take 500 mg by mouth every 8 (eight) hours as needed (for pain or headaches).    Unk at [employer]  . amLODipine (NORVASC) 10 MG tablet Take 1 tablet (10 mg total) by mouth daily. 90 tablet 3 Past Week at Unknown time  . atorvastatin (LIPITOR) 80 MG tablet Take 1 tablet (80 mg total) by mouth daily. 90 tablet 3 Past Week at Unknown time  . carvedilol (COREG) 25 MG tablet Take 1 tablet (25 mg total) by mouth 2 (two) times daily with a meal. 180 tablet 3 Past Week at Unknown time  . cholecalciferol (VITAMIN D) 1000 units tablet Take 1,000 Units by mouth daily.   Past Week at Unknown time  . clopidogrel (PLAVIX) 75 MG tablet Take 1 tablet (75 mg total) by mouth daily with breakfast. 90 tablet 3 Past Week at Unknown time  . EPINEPHrine (PRIMATENE MIST) 0.125 MG/ACT AERO Inhale 2 puffs into the lungs 5 (five) times daily as needed (for shortness of breath).   03/25/2018 at am  . Fexofenadine HCl (ALLEGRA PO) Take 1 tablet by mouth daily as needed (for allergies).   Unk at [employer]  . furosemide (LASIX) 40 MG tablet TAKE 1 TABLET IN THE MORNING AND 1/2 TABLET IN THE EVENING (Patient taking differently: Take 20-40 mg by mouth See admin instructions. Take 40 mg by mouth in the morning and 20 mg in the evening) 45 tablet 0 Past Week at Unknown time  . glimepiride (AMARYL) 1 MG tablet Take 1 tablet (1 mg total) by mouth 2 (two) times daily. 60 tablet 3 Past Week at Unknown time  . lisinopril (PRINIVIL,ZESTRIL) 40 MG tablet Take 1 tablet (40 mg total) by mouth daily. 90 tablet 3 Past Week at Unknown time  . metFORMIN  (GLUCOPHAGE) 1000 MG tablet Take 1 tablet (1,000 mg total) by mouth 2 (two) times daily with a meal. 60 tablet 0 Past Week at Unknown time  . nitroGLYCERIN (NITROSTAT) 0.4 MG SL tablet Place 1 tablet (0.4 mg total) under the tongue every 5 (five) minutes as needed for chest pain. 25 tablet 2 Unk at [employer]  . VENTOLIN HFA 108 (90 Base) MCG/ACT inhaler INHALE 2 PUFFS INTO THE LUNGS AS NEEDED (Patient not taking: Reported on 03/25/2018) 18 g 0 Not Taking at Unknown time    Inpatient Medications:  . apixaban  5 mg Oral BID  . atorvastatin  80 mg Oral q1800  . carvedilol  3.125 mg Oral BID WC  . digoxin  0.125 mg Oral Daily  . glimepiride  1 mg Oral BID  . insulin aspart  0-9 Units Subcutaneous TID WC  . loratadine  10 mg Oral Daily  . mometasone-formoterol  2 puff Inhalation BID  . ranolazine  500 mg Oral BID  . sodium chloride flush  3 mL Intravenous Q12H  . sodium chloride flush  3 mL Intravenous Q12H  . spironolactone  25 mg Oral Daily  . torsemide  40 mg Oral Daily    Allergies: No Known Allergies  Social History   Socioeconomic History  . Marital status: Married    Spouse name: Not on file  . Number of children: Not on file  . Years of education: Not on file  . Highest education level: Not on file  Occupational History  . Not on file  Social Needs  . Financial resource strain: Not on file  . Food insecurity:    Worry: Not on file    Inability: Not on file  . Transportation needs:    Medical: Not on file    Non-medical: Not on file  Tobacco Use  . Smoking status: Never Smoker  . Smokeless tobacco: Never Used  Substance and Sexual Activity  . Alcohol use: Yes    Comment: rare use now  . Drug use: No  . Sexual activity: Not on file  Lifestyle  . Physical activity:    Days per week: Not on file    Minutes per session: Not on file  . Stress: Not on file  Relationships  . Social connections:    Talks on phone: Not on file    Gets together: Not on file    Attends  religious service: Not on file    Active member of club or organization: Not on file    Attends meetings of clubs or organizations: Not on file    Relationship status: Not on file  . Intimate partner violence:    Fear of current or ex partner: Not on file    Emotionally abused: Not on file    Physically abused: Not on file    Forced sexual activity: Not on file  Other Topics Concern  . Not on file  Social History Narrative  . Not on file     Family History  Problem Relation Age of Onset  . CAD Neg Hx      Review of Systems: All other systems reviewed and are otherwise negative except as noted above.  Physical Exam: Vitals:   04/02/18 0750 04/02/18 0800 04/02/18 0810 04/02/18 0847  BP: (!) 125/99 122/90 98/61 (!) 117/93  Pulse: 82 (!) 52 100 96  Resp: 17 (!) 22 (!) 22 18  Temp: 98.5 F (36.9 C)     TempSrc: Oral     SpO2: 92% 97% 97% 99%  Weight:      Height:        GEN- The patient is elderly appearing, alert and oriented x 3 today.   HEENT: normocephalic, atraumatic; sclera clear, conjunctiva pink; hearing intact; oropharynx clear; neck supple Lungs- Clear to ausculation bilaterally, normal work of breathing.  No wheezes, rales, rhonchi Heart- Tachycardic irregular rate and rhythm  GI- soft, non-tender, non-distended, bowel sounds present Extremities- no clubbing, cyanosis, or edema  MS- no significant deformity or atrophy Skin- warm and dry, no rash or lesion Psych- euthymic mood, full affect Neuro- strength and sensation are intact  Labs:   Lab Results  Component Value Date   WBC 9.8 04/02/2018   HGB 14.5 04/02/2018   HCT 46.4 04/02/2018   MCV 90.3 04/02/2018   PLT 207 04/02/2018    Recent Labs  Lab 04/02/18 0434  NA 136  K 3.8  CL 94*  CO2 30  BUN  36*  CREATININE 1.88*  CALCIUM 9.0  GLUCOSE 141*      Radiology/Studies: Dg Chest 2 View  Result Date: 03/25/2018 CLINICAL DATA:  Shortness of breath a tachycardia. Productive cough for 3 weeks.  EXAM: CHEST - 2 VIEW COMPARISON:  Chest radiographs 02/10/2016 and CTA 02/11/2016 FINDINGS: The cardiac silhouette remains mildly enlarged. The interstitial markings are mildly increased bilaterally in a symmetric fashion, slightly greater than on the prior radiographs. A small right pleural effusion is slightly larger than on the prior radiographs. No pneumothorax is identified. Old bilateral rib fractures are noted. IMPRESSION: Bilateral interstitial densities suggesting mild edema. Small right pleural effusion. Electronically Signed   By: Sebastian Ache M.D.   On: 03/25/2018 13:16   Dg Chest Port 1 View  Result Date: 03/26/2018 CLINICAL DATA:  PICC line placement. EXAM: PORTABLE CHEST 1 VIEW COMPARISON:  Radiographs of March 25, 2018. FINDINGS: Stable cardiomegaly. No pneumothorax is noted. Interval placement of right-sided PICC line with distal tip in expected position of the SVC. Left lung is clear. Increased right basilar atelectasis or infiltrate is noted with associated pleural effusion. Bony thorax is unremarkable. IMPRESSION: Interval placement of right-sided PICC line with tip in expected position of the SVC. Increased right basilar atelectasis or infiltrate with associated pleural effusion. Electronically Signed   By: Lupita Raider, M.D.   On: 03/26/2018 21:41   [place] Ekg Site Rite  Result Date: 03/26/2018 If Site Rite image not attached, placement could not be confirmed due to current cardiac rhythm.   EKG:AF, rate 139 (personally reviewed)  TELEMETRY: AF, V rates 100-120's (personally reviewed)  Assessment/Plan: 1.  Newly diagnosed but likely persistent atrial fibrillation/recurrent cardiomyopathy/acute on chronic systolic heart failure Much improved with diuresis He remains in AF with RVR despite loading with IV amiodarone and cardioversion failed to terminate AF today His LA is severely enlarged and I suspect he has been in AF for several months but asymptomatic It would also be  prudent to rule out progression of CAD as a cause for recurrent cardiomyopathy.  Dr Johney Frame and Dr Shirlee Latch to discuss timing of cath.  Options are to continue amiodarone load and reattempt cardioversion in a few weeks with eventual likely AF ablation vs pacemaker and AVN ablation. As he is symptomatically much improved, I would favor continued loading of amiodarone and reattempt cardioversion in a couple of weeks. CHADS2VASC is 6 - continue Eliquis long term  Dr Johney Frame to see later today  For questions or updates, please contact [facility] Please consult [email or web address] for contact info under Cardiology/STEMI.  Signed, Gypsy Balsam, NP 04/02/2018 11:31 AM  I have seen, examined the patient, and reviewed the above assessment and plan.  Changes to above are made where necessary.  On exam, iRRR.  EF is now depressed, possibly due to afib.  I do think that we should proceed with cath to further evaluate CAD as another possible cause of his reduced EF.  Hold eliquis for cath tomorrow.  Prior cath is reviewed.  If he is felt to have surgical disease, could consider MAZE and LAA at time of CABG.  If medical management or PCI is advised, then we will continue oral amiodarone load after discharge with plans for cardioversion in 4 weeks.  Co Sign: Hillis Range, MD 04/02/2018 5:06 PM

## 2018-04-02 NOTE — Telephone Encounter (Signed)
Called patient to schedule Medicare AWV but was informed that the patient is in the hospital at this time and will call when he is home

## 2018-04-02 NOTE — Procedures (Signed)
Electrical Cardioversion Procedure Note ANDREAZ KALMBACH [id number] [birth year]  Procedure: Electrical Cardioversion Indications:  Atrial Fibrillation  Procedure Details Consent: Risks of procedure as well as the alternatives and risks of each were explained to the (patient/caregiver).  Consent for procedure obtained. Time Out: Verified patient identification, verified procedure, site/side was marked, verified correct patient position, special equipment/implants available, medications/allergies/relevent history reviewed, required imaging and test results available.  Performed  Patient placed on cardiac monitor, pulse oximetry, supplemental oxygen as necessary.  Sedation given: Propofol per anesthesiology Pacer pads placed anterior and posterior chest.  Cardioverted 3 times, with sternal pressure the 2nd two times.  Cardioverted at 200J.  Evaluation Findings: Post procedure EKG shows: Atrial Fibrillation Complications: None Patient did tolerate procedure well.   [name] 04/02/2018, 7:50 AM

## 2018-04-02 NOTE — Transfer of Care (Signed)
Immediate Anesthesia Transfer of Care Note  Patient: David Huynh  Procedure(s) Performed: CARDIOVERSION (N/A )  Patient Location: Endoscopy Unit  Anesthesia Type:General  Level of Consciousness: drowsy and responds to stimulation  Airway & Oxygen Therapy: Patient Spontanous Breathing  Post-op Assessment: Report given to RN and Post -op Vital signs reviewed and stable  Post vital signs: Reviewed and stable  Last Vitals:  Vitals Value Taken Time  BP 98/83 04/02/2018  7:48 AM  Temp    Pulse 100 04/02/2018  7:49 AM  Resp 19 04/02/2018  7:49 AM  SpO2 95 % 04/02/2018  7:49 AM    Last Pain:  Vitals:   04/02/18 0705  TempSrc: Oral  PainSc: 0-No pain      Patients Stated Pain Goal: 0 (03/29/18 2010)  Complications: No apparent anesthesia complications

## 2018-04-02 NOTE — Progress Notes (Signed)
Patient has hospital follow-up appt in the [facility] on 04/10/18 at 1000 AM.

## 2018-04-02 NOTE — Interval H&P Note (Signed)
History and Physical Interval Note:  04/02/2018 7:49 AM  David Huynh  has presented today for surgery, with the diagnosis of a fib  The various methods of treatment have been discussed with the patient and family. After consideration of risks, benefits and other options for treatment, the patient has consented to  Procedure(s): CARDIOVERSION (N/A) as a surgical intervention .  The patient's history has been reviewed, patient examined, no change in status, stable for surgery.  I have reviewed the patient's chart and labs.  Questions were answered to the patient's satisfaction.     Laiyla Slagel [employer]

## 2018-04-03 ENCOUNTER — Encounter (HOSPITAL_COMMUNITY): Payer: Self-pay | Admitting: Cardiology

## 2018-04-03 ENCOUNTER — Encounter (HOSPITAL_COMMUNITY)
Admission: EM | Disposition: A | Discharge status: Home / Self Care | Payer: Self-pay | Source: Home / Self Care | Attending: Cardiology

## 2018-04-03 DIAGNOSIS — I25119 Atherosclerotic heart disease of native coronary artery with unspecified angina pectoris: Secondary | ICD-10-CM

## 2018-04-03 DIAGNOSIS — I509 Heart failure, unspecified: Secondary | ICD-10-CM

## 2018-04-03 HISTORY — PX: RIGHT/LEFT HEART CATH AND CORONARY ANGIOGRAPHY: CATH118266

## 2018-04-03 HISTORY — PX: CORONARY STENT INTERVENTION: CATH118234

## 2018-04-03 LAB — POCT I-STAT 3, VENOUS BLOOD GAS (G3P V)
ACID-BASE EXCESS: 4 mmol/L — AB (ref 0.0–2.0)
ACID-BASE EXCESS: 4 mmol/L — AB (ref 0.0–2.0)
BICARBONATE: 29 mmol/L — AB (ref 20.0–28.0)
BICARBONATE: 30 mmol/L — AB (ref 20.0–28.0)
O2 Saturation: 61 %
O2 Saturation: 60 %
PCO2 VEN: 47.6 mmHg (ref 44.0–60.0)
TCO2: 31 mmol/L (ref 22–32)
TCO2: 30 mmol/L (ref 22–32)
pCO2, Ven: 45.3 mmHg (ref 44.0–60.0)
pH, Ven: 7.415 (ref 7.250–7.430)
pH, Ven: 7.407 (ref 7.250–7.430)
pO2, Ven: 31 mmHg — CL (ref 32.0–45.0)
pO2, Ven: 31 mmHg — CL (ref 32.0–45.0)

## 2018-04-03 LAB — BASIC METABOLIC PANEL
ANION GAP: 13 (ref 5–15)
BUN: 32 mg/dL — ABNORMAL HIGH (ref 8–23)
CO2: 27 mmol/L (ref 22–32)
Calcium: 8.9 mg/dL (ref 8.9–10.3)
Chloride: 95 mmol/L — ABNORMAL LOW (ref 98–111)
Creatinine, Ser: 1.85 mg/dL — ABNORMAL HIGH (ref 0.61–1.24)
GFR calc Af Amer: 39 mL/min — ABNORMAL LOW (ref >60)
GFR, EST NON AFRICAN AMERICAN: 34 mL/min — AB (ref >60)
Glucose, Bld: 118 mg/dL — ABNORMAL HIGH (ref 70–99)
POTASSIUM: 4.4 mmol/L (ref 3.5–5.1)
Sodium: 135 mmol/L (ref 135–145)

## 2018-04-03 LAB — GLUCOSE, CAPILLARY
GLUCOSE-CAPILLARY: 218 mg/dL — AB (ref 70–99)
Glucose-Capillary: 108 mg/dL — ABNORMAL HIGH (ref 70–99)
Glucose-Capillary: 170 mg/dL — ABNORMAL HIGH (ref 70–99)
Glucose-Capillary: 114 mg/dL — ABNORMAL HIGH (ref 70–99)

## 2018-04-03 LAB — COOXEMETRY PANEL
Carboxyhemoglobin: 1.7 % — ABNORMAL HIGH (ref 0.5–1.5)
Methemoglobin: 0.8 % (ref 0.0–1.5)
O2 Saturation: 69.8 %
Total hemoglobin: 15.3 g/dL (ref 12.0–16.0)

## 2018-04-03 LAB — POCT ACTIVATED CLOTTING TIME
Activated Clotting Time: 290 seconds
Activated Clotting Time: 742 seconds
Activated Clotting Time: 268 seconds

## 2018-04-03 LAB — DIGOXIN LEVEL: Digoxin Level: <0.2 ng/mL — ABNORMAL LOW (ref 0.8–2.0)

## 2018-04-03 LAB — MAGNESIUM: MAGNESIUM: 2.4 mg/dL (ref 1.7–2.4)

## 2018-04-03 SURGERY — RIGHT/LEFT HEART CATH AND CORONARY ANGIOGRAPHY
Anesthesia: LOCAL

## 2018-04-03 MED ORDER — HEPARIN (PORCINE) IN NACL 1000-0.9 UT/500ML-% IV SOLN
INTRAVENOUS | Status: AC
  Filled 2018-04-03: qty 500

## 2018-04-03 MED ORDER — SODIUM CHLORIDE 0.9 % IV SOLN: INTRAVENOUS | Status: DC

## 2018-04-03 MED ORDER — VERAPAMIL HCL 2.5 MG/ML IV SOLN
INTRAVENOUS | Status: DC | PRN
  Administered 2018-04-03: 10 mL via INTRA_ARTERIAL

## 2018-04-03 MED ORDER — MIDAZOLAM HCL 2 MG/2ML IJ SOLN
INTRAMUSCULAR | Status: DC | PRN
  Administered 2018-04-03 (×2): 1 mg via INTRAVENOUS

## 2018-04-03 MED ORDER — SODIUM CHLORIDE 0.9% FLUSH: 3.0000 mL | INTRAVENOUS | Status: DC | PRN

## 2018-04-03 MED ORDER — IOHEXOL 350 MG/ML SOLN
INTRAVENOUS | Status: DC | PRN
  Administered 2018-04-03: 60 mL via INTRA_ARTERIAL

## 2018-04-03 MED ORDER — AMIODARONE HCL 200 MG PO TABS
400.0000 mg | ORAL_TABLET | Freq: Two times a day (BID) | ORAL | Status: DC
  Administered 2018-04-03 – 2018-04-04 (×3): 400 mg via ORAL
  Filled 2018-04-03 (×3): qty 2

## 2018-04-03 MED ORDER — CLOPIDOGREL BISULFATE 300 MG PO TABS
ORAL_TABLET | ORAL | Status: DC | PRN
  Administered 2018-04-03: 600 mg via ORAL

## 2018-04-03 MED ORDER — MIDAZOLAM HCL 2 MG/2ML IJ SOLN
INTRAMUSCULAR | Status: AC
  Filled 2018-04-03: qty 2

## 2018-04-03 MED ORDER — ONDANSETRON HCL 4 MG/2ML IJ SOLN: 4.0000 mg | Freq: Four times a day (QID) | INTRAMUSCULAR | Status: DC | PRN

## 2018-04-03 MED ORDER — LABETALOL HCL 5 MG/ML IV SOLN: 10.0000 mg | INTRAVENOUS | Status: AC | PRN

## 2018-04-03 MED ORDER — NITROGLYCERIN 1 MG/10 ML FOR IR/CATH LAB
INTRA_ARTERIAL | Status: DC | PRN
  Administered 2018-04-03 (×2): 200 ug via INTRACORONARY
  Administered 2018-04-03: 100 ug via INTRACORONARY

## 2018-04-03 MED ORDER — CLOPIDOGREL BISULFATE 300 MG PO TABS
ORAL_TABLET | ORAL | Status: AC
  Filled 2018-04-03: qty 1

## 2018-04-03 MED ORDER — HEPARIN (PORCINE) IN NACL 1000-0.9 UT/500ML-% IV SOLN
INTRAVENOUS | Status: DC | PRN
  Administered 2018-04-03 (×2): 500 mL

## 2018-04-03 MED ORDER — HEPARIN SODIUM (PORCINE) 1000 UNIT/ML IJ SOLN
INTRAMUSCULAR | Status: DC | PRN
  Administered 2018-04-03: 5500 [IU] via INTRAVENOUS

## 2018-04-03 MED ORDER — ACETAMINOPHEN 325 MG PO TABS: 650.0000 mg | ORAL_TABLET | ORAL | Status: DC | PRN

## 2018-04-03 MED ORDER — FENTANYL CITRATE (PF) 100 MCG/2ML IJ SOLN
INTRAMUSCULAR | Status: DC | PRN
  Administered 2018-04-03 (×2): 25 ug via INTRAVENOUS

## 2018-04-03 MED ORDER — SODIUM CHLORIDE 0.9% FLUSH: 10.0000 mL | INTRAVENOUS | Status: DC | PRN

## 2018-04-03 MED ORDER — CLOPIDOGREL BISULFATE 75 MG PO TABS
75.0000 mg | ORAL_TABLET | Freq: Every day | ORAL | Status: DC
  Administered 2018-04-04: 75 mg via ORAL
  Filled 2018-04-03: qty 1

## 2018-04-03 MED ORDER — CARVEDILOL 3.125 MG PO TABS
6.2500 mg | ORAL_TABLET | Freq: Two times a day (BID) | ORAL | Status: DC
  Administered 2018-04-04: 6.25 mg via ORAL
  Filled 2018-04-03: qty 2

## 2018-04-03 MED ORDER — DIAZEPAM 5 MG PO TABS: 5.0000 mg | ORAL_TABLET | Freq: Four times a day (QID) | ORAL | Status: DC | PRN

## 2018-04-03 MED ORDER — SODIUM CHLORIDE 0.9 % IV SOLN: 250.0000 mL | INTRAVENOUS | Status: DC | PRN

## 2018-04-03 MED ORDER — ANGIOPLASTY BOOK
Freq: Once | Status: AC
  Administered 2018-04-03: 21:00:00 1
  Filled 2018-04-03: qty 1

## 2018-04-03 MED ORDER — LIDOCAINE HCL (PF) 1 % IJ SOLN
INTRAMUSCULAR | Status: DC | PRN
  Administered 2018-04-03: 5 mL

## 2018-04-03 MED ORDER — ASPIRIN 81 MG PO CHEW
81.0000 mg | CHEWABLE_TABLET | Freq: Every day | ORAL | Status: DC
  Administered 2018-04-04: 81 mg via ORAL
  Filled 2018-04-03: qty 1

## 2018-04-03 MED ORDER — FENTANYL CITRATE (PF) 100 MCG/2ML IJ SOLN
INTRAMUSCULAR | Status: AC
  Filled 2018-04-03: qty 2

## 2018-04-03 MED ORDER — NITROGLYCERIN 1 MG/10 ML FOR IR/CATH LAB
INTRA_ARTERIAL | Status: AC
  Filled 2018-04-03: qty 10

## 2018-04-03 MED ORDER — HYDRALAZINE HCL 20 MG/ML IJ SOLN: 5.0000 mg | INTRAMUSCULAR | Status: AC | PRN

## 2018-04-03 MED ORDER — APIXABAN 5 MG PO TABS
5.0000 mg | ORAL_TABLET | Freq: Two times a day (BID) | ORAL | Status: DC
  Administered 2018-04-04: 06:00:00 5 mg via ORAL
  Filled 2018-04-03: qty 1

## 2018-04-03 MED ORDER — VERAPAMIL HCL 2.5 MG/ML IV SOLN
INTRAVENOUS | Status: AC
  Filled 2018-04-03: qty 2

## 2018-04-03 MED ORDER — SODIUM CHLORIDE 0.9% FLUSH
3.0000 mL | Freq: Two times a day (BID) | INTRAVENOUS | Status: DC
  Administered 2018-04-03 – 2018-04-04 (×2): 3 mL via INTRAVENOUS

## 2018-04-03 MED ORDER — IOHEXOL 350 MG/ML SOLN
INTRAVENOUS | Status: DC | PRN
  Administered 2018-04-03: 80 mL via INTRA_ARTERIAL

## 2018-04-03 MED ORDER — HEPARIN SODIUM (PORCINE) 1000 UNIT/ML IJ SOLN
INTRAMUSCULAR | Status: DC | PRN
  Administered 2018-04-03: 7000 [IU] via INTRAVENOUS
  Administered 2018-04-03: 1500 [IU] via INTRAVENOUS

## 2018-04-03 MED ORDER — LIDOCAINE HCL (PF) 1 % IJ SOLN
INTRAMUSCULAR | Status: AC
  Filled 2018-04-03: qty 30

## 2018-04-03 MED ORDER — MIDAZOLAM HCL 2 MG/2ML IJ SOLN
INTRAMUSCULAR | Status: DC | PRN
  Administered 2018-04-03: 0.5 mg via INTRAVENOUS

## 2018-04-03 MED ORDER — ATORVASTATIN CALCIUM 80 MG PO TABS: 80.0000 mg | ORAL_TABLET | Freq: Every day | ORAL | Status: DC

## 2018-04-03 SURGICAL SUPPLY — 19 items
BALLN SAPPHIRE ~~LOC~~ 3.25X10 (BALLOONS) ×2 IMPLANT
BALLN WOLVERINE 2.50X10 (BALLOONS) ×2
BALLOON WOLVERINE 2.50X10 (BALLOONS) ×1 IMPLANT
CATH BALLN WEDGE 5F 110CM (CATHETERS) ×2 IMPLANT
CATH INFINITI 5FR MULTPACK ANG (CATHETERS) ×2 IMPLANT
CATH VISTA GUIDE 6FR XB3.5 (CATHETERS) ×2 IMPLANT
DEVICE RAD COMP TR BAND LRG (VASCULAR PRODUCTS) ×2 IMPLANT
GLIDESHEATH SLEND SS 6F .021 (SHEATH) ×2 IMPLANT
GUIDEWIRE .025 260CM (WIRE) ×2 IMPLANT
GUIDEWIRE INQWIRE 1.5J.035X260 (WIRE) ×1 IMPLANT
INQWIRE 1.5J .035X260CM (WIRE) ×2
KIT ENCORE 26 ADVANTAGE (KITS) ×2 IMPLANT
KIT HEART LEFT (KITS) ×2 IMPLANT
PACK CARDIAC CATHETERIZATION (CUSTOM PROCEDURE TRAY) ×2 IMPLANT
SHEATH GLIDE SLENDER 4/5FR (SHEATH) ×2 IMPLANT
STENT SIERRA 3.00 X 12 MM (Permanent Stent) ×2 IMPLANT
TRANSDUCER W/STOPCOCK (MISCELLANEOUS) ×2 IMPLANT
TUBING CIL FLEX 10 FLL-RA (TUBING) ×2 IMPLANT
WIRE MINAMO 190 (WIRE) ×2 IMPLANT

## 2018-04-03 NOTE — H&P (View-Only) (Signed)
Patient ID: David Huynh, male   DOB: 04/10/1941, 77 y.o.   MRN: [id number]     Advanced Heart Failure Rounding Note  PCP-Cardiologist: David Harding, MD   Subjective:    Underwent DC-CV on 11/15. Was successful but reverted back to AF soon afterward.  DCCV 11/19 but did not convert to NSR at all.   Remains on IV amiodarone. Ranexa added 11/16. HR in 80s-100s.   Creatinine 1.95 => 1.86 => 1.88 => 1.85.  He is on po torsemide with CVP 10 range and co-ox 70%.    No dyspnea walking, feels good.     Objective:   Weight Range: 114.7 kg Body mass index is 34.29 kg/m.   Vital Signs:   Temp:  [97.4 F (36.3 C)-98.3 F (36.8 C)] 98.3 F (36.8 C) (11/20 0725) Pulse Rate:  [75-114] 114 (11/20 0725) Resp:  [16-18] 18 (11/20 0434) BP: (99-114)/(63-85) 110/85 (11/20 0725) SpO2:  [94 %-96 %] 96 % (11/20 0434) Weight:  [114.7 kg] 114.7 kg (11/20 0434) Last BM Date: 04/02/18  Weight change: Filed Weights   04/01/18 0649 04/02/18 0454 04/03/18 0434  Weight: 114.6 kg 113.5 kg 114.7 kg    Intake/Output:   Intake/Output Summary (Last 24 hours) at 04/03/2018 0925 Last data filed at 04/03/2018 0434 Gross per 24 hour  Intake 680 ml  Output 1200 ml  Net -520 ml      Physical Exam    General: NAD Neck: JVP 9-10 cm, no thyromegaly or thyroid nodule.  Lungs: Clear to auscultation bilaterally with normal respiratory effort. CV: Nondisplaced PMI.  Heart irregular S1/S2, no S3/S4, no murmur.  No peripheral edema.   Abdomen: Soft, nontender, no hepatosplenomegaly, no distention.  Skin: Intact without lesions or rashes.  Neurologic: Alert and oriented x 3.  Psych: Normal affect. Extremities: No clubbing or cyanosis.  HEENT: Normal.    Telemetry   Afib 80s-100s, personally reviewed.   EKG    No new tracings.    Labs    CBC Recent Labs    04/01/18 0500 04/02/18 0434  WBC 10.2 9.8  NEUTROABS 6.5 6.3  HGB 14.6 14.5  HCT 47.3 46.4  MCV 90.8 90.3  PLT 226 207    Basic Metabolic Panel Recent Labs    04/02/18 0434 04/03/18 0232  NA 136 135  K 3.8 4.4  CL 94* 95*  CO2 30 27  GLUCOSE 141* 118*  BUN 36* 32*  CREATININE 1.88* 1.85*  CALCIUM 9.0 8.9  MG 2.5* 2.4   Liver Function Tests No results for input(s): AST, ALT, ALKPHOS, BILITOT, PROT, ALBUMIN in the last 72 hours. No results for input(s): LIPASE, AMYLASE in the last 72 hours. Cardiac Enzymes No results for input(s): CKTOTAL, CKMB, CKMBINDEX, TROPONINI in the last 72 hours.  BNP: BNP (last 3 results) Recent Labs    03/25/18 1225  BNP 381.9*    ProBNP (last 3 results) No results for input(s): PROBNP in the last 8760 hours.   D-Dimer No results for input(s): DDIMER in the last 72 hours. Hemoglobin A1C No results for input(s): HGBA1C in the last 72 hours. Fasting Lipid Panel No results for input(s): CHOL, HDL, LDLCALC, TRIG, CHOLHDL, LDLDIRECT in the last 72 hours. Thyroid Function Tests No results for input(s): TSH, T4TOTAL, T3FREE, THYROIDAB in the last 72 hours.  Invalid input(s): FREET3  Other results:   Imaging    No results found.   Medications:     Scheduled Medications: . amiodarone  400 mg Oral BID  .   atorvastatin  80 mg Oral q1800  . carvedilol  6.25 mg Oral BID WC  . digoxin  0.125 mg Oral Daily  . glimepiride  1 mg Oral BID  . insulin aspart  0-9 Units Subcutaneous TID WC  . loratadine  10 mg Oral Daily  . mometasone-formoterol  2 puff Inhalation BID  . ranolazine  500 mg Oral BID  . sodium chloride flush  3 mL Intravenous Q12H  . sodium chloride flush  3 mL Intravenous Q12H  . sodium chloride flush  3 mL Intravenous Q12H  . spironolactone  25 mg Oral Daily  . torsemide  40 mg Oral Daily    Infusions: . sodium chloride    . sodium chloride    . sodium chloride    . sodium chloride      PRN Medications: sodium chloride, sodium chloride, acetaminophen, hydrocortisone cream, levalbuterol, nitroGLYCERIN, ondansetron (ZOFRAN) IV, sodium  chloride flush, sodium chloride flush, sodium chloride flush    Patient Profile   David Huynh is a 77 y.o. male with a history of CAD, chronic combined HF, HTN, HLD, DM, CKD 3, and asthma.  He presented to [facility] with SOB. Found to be in afib RVR. Repeat echo showed reduced EF to 35-40% (prior echo EF 55-60%).  Assessment/Plan   1. Acute systolic CHF: Prior ischemic cardiomyopathy, improved after PCI.  Echo this admission with EF back down to 35-40% and moderate-severe RV dysfunction.  He has had no chest pain, only dyspnea.  TnI only slightly elevated around 0.04 with no trend.  Cannot rule out worsening coronary disease, but do not think acute or recent ACS.  It is possible that this is a tachycardia-mediated CMP (also has significant RV dysfunction), not sure how long he has been in atrial fibrillation as he does not feel palpitations. Co-ox stable at 70%, CVP around 10.  Creatinine 1.85. - Continue torsemide 40 mg daily. RHC will be done today, may increase dose based on findings.   - Continue digoxin 0.125 mg daily.  - Continue spironolactone 25 mg daily.  - No ACEi/ARB/Entresto yet with elevated creatinine.  - Increase Coreg to 6.25 mg bid.   - Continue TED hose - Need to get him out of atrial fibrillation ideally but failed DCCV x 2 now.  - I discussed situation with David Huynh, will plan coronary angiography today to rule out CAD as cause of worsening HF (versus tachy-mediated from afib).   2. Atrial fibrillation: Noted for the first time this admission.  Not sure how long he has been in afib as he does not feel palpitations. Has been short of breath for around 3 wks.  As above, concerned for possible tachy-mediated CMP, HR still 100s-110s.  Had successful DC-CV on 11/15 but then back in atrial fibrillation.  Ranolazine added, but failed DCCV again 11/19.  - Currently on ranolazine + amiodarone, will transition to po amiodarone.  - Discussed with David Huynh: plan cath  today as above to rule out CAD as cause of worsening HF (vs tachy-mediated). Eliquis on hold for today.  If surgical disease, would aim for CABG + Maze/LAA clip.  If no significant disease or PCI, would continue to load amiodarone for another 2-3 weeks then repeat DCCV.  If fails again, will re-address future steps with EP.  3. AKI: ?Baseline. Creatinine 1.5->1.7->1.95 -> 1.86 => 1.88 => 1.85.  4. CAD: History of DES to RCA in 9/17.  This admission, TnI mildly elevated at 0.04 with no   trend => suspect demand ischemia with volume overload.  As above, doubt ACS but cannot rule out worsening of CAD over the last couple of years.   - No ASA with stable CAD on Eliquis - Continue statin.  - As above, will plan RHC/LHC today to assess filling pressures and for CAD as cause of worsening EF/CHF.  I discussed risks/benefits with patient and he agrees to proceed.  Will need to minimize contrast with CKD.  6. DM2: Ideally would get off glimepiride with CAD and switch to metformin +/- SGLT2 once renal function more stable.   Length of Stay: 8  David Self, MD  04/03/2018, 9:25 AM  Advanced Heart Failure Team Pager [phone] (M-F; 7a - 4p)  Please contact [facility] for night-coverage after hours (4p -7a ) and weekends on [email or web address]   

## 2018-04-03 NOTE — Progress Notes (Signed)
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 This nurse stay at beside and assist patient with urinal as well as with his dinner. Patient has blood pressure cuff to left lower leg, continues to move his leg and unable to obtain an accurate blood pressure reading at this time, will attempt again when patient is not agitated. No other S/S of distress noted or complaints voiced at this time, Patient's stent care is at bedside, call bell is in reach and bed alarm activated. Patient's belongings from [address] brought as well.

## 2018-04-03 NOTE — Progress Notes (Signed)
TR BAND REMOVAL  LOCATION:    right radial  DEFLATED PER PROTOCOL:    Yes.    TIME BAND OFF / DRESSING APPLIED:    21:30   SITE UPON ARRIVAL:    Level 0  SITE AFTER BAND REMOVAL:    Level 0  CIRCULATION SENSATION AND MOVEMENT:    Within Normal Limits   Yes.    COMMENTS:   Post TR band instructions given. Pt tolerated well. 

## 2018-04-03 NOTE — Consult Note (Signed)
            [facility] CM Primary Care Navigator  04/03/2018  David Huynh Mar 19, 1941 [id number]   Went to see patient at the bedside to identify possible discharge needs but he is off the unit in the cath lab for a procedure at this time. (right/ left cardiac catheterization and stent intervention)  Will attempt to see patient at another time when he is available in the room.    Addendum (04/04/18):  Went back to see patient at the bedside to identify possible discharge needs but he was discharged home per RN report.  Per MD note, patient presented to [facility] with shortness of breath, was found to be in atrial fibrillation with rapid ventricular response. His repeat echo showed reduced EF to 35-40% [prior echo EF 55-60%]. (acute on chronic systolic HF, atrial fibrillation [new this admit], acute kidney injury, coronary artery disease, DM II)  Patient has discharge instruction to follow-up with cardiology on 04/10/18 (patient is followed by [facility]).  Primary care provider's office is listed as providing transition of care (TOC) follow-up.  Primary care provider's officewascalled(spoke to Lupita Leash for Karen)to notifyofpatient'sdischarge and need for post hospital follow-up and transition of care (TOC) and to informof patient's health issues needing close follow-up (mainlyHF, new Afib, DM with A1c of 8.8). Made aware to refer patient to [facility] care management if deemed necessary and appropriate foranyservices.   For additional questions please contact:  Karin Golden A. Arlee Bossard, BSN, RN-BC [facility] PRIMARY CARE Navigator Cell: [phone]

## 2018-04-03 NOTE — Interval H&P Note (Signed)
History and Physical Interval Note:  04/03/2018 2:48 PM  David Huynh  has presented today for surgery, with the diagnosis of cp  The various methods of treatment have been discussed with the patient and family. After consideration of risks, benefits and other options for treatment, the patient has consented to  Procedure(s): RIGHT/LEFT HEART CATH AND CORONARY ANGIOGRAPHY (N/A) as a surgical intervention .  The patient's history has been reviewed, patient examined, no change in status, stable for surgery.  I have reviewed the patient's chart and labs.  Questions were answered to the patient's satisfaction.     Darvin Dials [employer]

## 2018-04-03 NOTE — Progress Notes (Signed)
Patient ID: David Huynh, male   DOB: 1941/04/14, 77 y.o.   MRN: [id number]     Advanced Heart Failure Rounding Note  PCP-Cardiologist: [name], MD   Subjective:    Underwent DC-CV on 11/15. Was successful but reverted back to AF soon afterward.  DCCV 11/19 but did not convert to NSR at all.   Remains on IV amiodarone. Ranexa added 11/16. HR in 80s-100s.   Creatinine 1.95 => 1.86 => 1.88 => 1.85.  He is on po torsemide with CVP 10 range and co-ox 70%.    No dyspnea walking, feels good.     Objective:   Weight Range: 114.7 kg Body mass index is 34.29 kg/m.   Vital Signs:   Temp:  [97.4 F (36.3 C)-98.3 F (36.8 C)] 98.3 F (36.8 C) (11/20 0725) Pulse Rate:  [75-114] 114 (11/20 0725) Resp:  [16-18] 18 (11/20 0434) BP: (99-114)/(63-85) 110/85 (11/20 0725) SpO2:  [94 %-96 %] 96 % (11/20 0434) Weight:  [114.7 kg] 114.7 kg (11/20 0434) Last BM Date: 04/02/18  Weight change: Filed Weights   04/01/18 0649 04/02/18 0454 04/03/18 0434  Weight: 114.6 kg 113.5 kg 114.7 kg    Intake/Output:   Intake/Output Summary (Last 24 hours) at 04/03/2018 0925 Last data filed at 04/03/2018 0434 Gross per 24 hour  Intake 680 ml  Output 1200 ml  Net -520 ml      Physical Exam    General: NAD Neck: JVP 9-10 cm, no thyromegaly or thyroid nodule.  Lungs: Clear to auscultation bilaterally with normal respiratory effort. CV: Nondisplaced PMI.  Heart irregular S1/S2, no S3/S4, no murmur.  No peripheral edema.   Abdomen: Soft, nontender, no hepatosplenomegaly, no distention.  Skin: Intact without lesions or rashes.  Neurologic: Alert and oriented x 3.  Psych: Normal affect. Extremities: No clubbing or cyanosis.  HEENT: Normal.    Telemetry   Afib 80s-100s, personally reviewed.   EKG    No new tracings.    Labs    CBC Recent Labs    04/01/18 0500 04/02/18 0434  WBC 10.2 9.8  NEUTROABS 6.5 6.3  HGB 14.6 14.5  HCT 47.3 46.4  MCV 90.8 90.3  PLT 226 207    Basic Metabolic Panel Recent Labs    40/98/11 0434 04/03/18 0232  NA 136 135  K 3.8 4.4  CL 94* 95*  CO2 30 27  GLUCOSE 141* 118*  BUN 36* 32*  CREATININE 1.88* 1.85*  CALCIUM 9.0 8.9  MG 2.5* 2.4   Liver Function Tests No results for input(s): AST, ALT, ALKPHOS, BILITOT, PROT, ALBUMIN in the last 72 hours. No results for input(s): LIPASE, AMYLASE in the last 72 hours. Cardiac Enzymes No results for input(s): CKTOTAL, CKMB, CKMBINDEX, TROPONINI in the last 72 hours.  BNP: BNP (last 3 results) Recent Labs    03/25/18 1225  BNP 381.9*    ProBNP (last 3 results) No results for input(s): PROBNP in the last 8760 hours.   D-Dimer No results for input(s): DDIMER in the last 72 hours. Hemoglobin A1C No results for input(s): HGBA1C in the last 72 hours. Fasting Lipid Panel No results for input(s): CHOL, HDL, LDLCALC, TRIG, CHOLHDL, LDLDIRECT in the last 72 hours. Thyroid Function Tests No results for input(s): TSH, T4TOTAL, T3FREE, THYROIDAB in the last 72 hours.  Invalid input(s): FREET3  Other results:   Imaging    No results found.   Medications:     Scheduled Medications: . amiodarone  400 mg Oral BID  .  atorvastatin  80 mg Oral q1800  . carvedilol  6.25 mg Oral BID WC  . digoxin  0.125 mg Oral Daily  . glimepiride  1 mg Oral BID  . insulin aspart  0-9 Units Subcutaneous TID WC  . loratadine  10 mg Oral Daily  . mometasone-formoterol  2 puff Inhalation BID  . ranolazine  500 mg Oral BID  . sodium chloride flush  3 mL Intravenous Q12H  . sodium chloride flush  3 mL Intravenous Q12H  . sodium chloride flush  3 mL Intravenous Q12H  . spironolactone  25 mg Oral Daily  . torsemide  40 mg Oral Daily    Infusions: . sodium chloride    . sodium chloride    . sodium chloride    . sodium chloride      PRN Medications: sodium chloride, sodium chloride, acetaminophen, hydrocortisone cream, levalbuterol, nitroGLYCERIN, ondansetron (ZOFRAN) IV, sodium  chloride flush, sodium chloride flush, sodium chloride flush    Patient Profile   GYASI COVER is a 77 y.o. male with a history of CAD, chronic combined HF, HTN, HLD, DM, CKD 3, and asthma.  He presented to [facility] with SOB. Found to be in afib RVR. Repeat echo showed reduced EF to 35-40% (prior echo EF 55-60%).  Assessment/Plan   1. Acute systolic CHF: Prior ischemic cardiomyopathy, improved after PCI.  Echo this admission with EF back down to 35-40% and moderate-severe RV dysfunction.  He has had no chest pain, only dyspnea.  TnI only slightly elevated around 0.04 with no trend.  Cannot rule out worsening coronary disease, but do not think acute or recent ACS.  It is possible that this is a tachycardia-mediated CMP (also has significant RV dysfunction), not sure how long he has been in atrial fibrillation as he does not feel palpitations. Co-ox stable at 70%, CVP around 10.  Creatinine 1.85. - Continue torsemide 40 mg daily. RHC will be done today, may increase dose based on findings.   - Continue digoxin 0.125 mg daily.  - Continue spironolactone 25 mg daily.  - No ACEi/ARB/Entresto yet with elevated creatinine.  - Increase Coreg to 6.25 mg bid.   - Continue TED hose - Need to get him out of atrial fibrillation ideally but failed DCCV x 2 now.  - I discussed situation with Dr. Johney Frame, will plan coronary angiography today to rule out CAD as cause of worsening HF (versus tachy-mediated from afib).   2. Atrial fibrillation: Noted for the first time this admission.  Not sure how long he has been in afib as he does not feel palpitations. Has been short of breath for around 3 wks.  As above, concerned for possible tachy-mediated CMP, HR still 100s-110s.  Had successful DC-CV on 11/15 but then back in atrial fibrillation.  Ranolazine added, but failed DCCV again 11/19.  - Currently on ranolazine + amiodarone, will transition to po amiodarone.  - Discussed with Dr. Johney Frame: plan cath  today as above to rule out CAD as cause of worsening HF (vs tachy-mediated). Eliquis on hold for today.  If surgical disease, would aim for CABG + Maze/LAA clip.  If no significant disease or PCI, would continue to load amiodarone for another 2-3 weeks then repeat DCCV.  If fails again, will re-address future steps with EP.  3. AKI: ?Baseline. Creatinine 1.5->1.7->1.95 -> 1.86 => 1.88 => 1.85.  4. CAD: History of DES to RCA in 9/17.  This admission, TnI mildly elevated at 0.04 with no  trend => suspect demand ischemia with volume overload.  As above, doubt ACS but cannot rule out worsening of CAD over the last couple of years.   - No ASA with stable CAD on Eliquis - Continue statin.  - As above, will plan RHC/LHC today to assess filling pressures and for CAD as cause of worsening EF/CHF.  I discussed risks/benefits with patient and he agrees to proceed.  Will need to minimize contrast with CKD.  6. DM2: Ideally would get off glimepiride with CAD and switch to metformin +/- SGLT2 once renal function more stable.   Length of Stay: 8  [name], MD  04/03/2018, 9:25 AM  Advanced Heart Failure Team Pager [phone] (M-F; 7a - 4p)  Please contact [facility] for night-coverage after hours (4p -7a ) and weekends on [email or web address]

## 2018-04-04 ENCOUNTER — Encounter (HOSPITAL_COMMUNITY): Payer: Self-pay | Admitting: Cardiology

## 2018-04-04 ENCOUNTER — Other Ambulatory Visit (HOSPITAL_COMMUNITY): Payer: Self-pay | Admitting: Pharmacist

## 2018-04-04 ENCOUNTER — Encounter (HOSPITAL_COMMUNITY): Payer: Self-pay

## 2018-04-04 ENCOUNTER — Other Ambulatory Visit (HOSPITAL_COMMUNITY): Payer: Self-pay

## 2018-04-04 DIAGNOSIS — I4891 Unspecified atrial fibrillation: Secondary | ICD-10-CM

## 2018-04-04 DIAGNOSIS — I482 Chronic atrial fibrillation, unspecified: Secondary | ICD-10-CM

## 2018-04-04 LAB — BASIC METABOLIC PANEL
ANION GAP: 10 (ref 5–15)
BUN: 30 mg/dL — ABNORMAL HIGH (ref 8–23)
CHLORIDE: 99 mmol/L (ref 98–111)
CO2: 28 mmol/L (ref 22–32)
Calcium: 8.5 mg/dL — ABNORMAL LOW (ref 8.9–10.3)
Creatinine, Ser: 1.79 mg/dL — ABNORMAL HIGH (ref 0.61–1.24)
GFR, EST AFRICAN AMERICAN: 41 mL/min — AB (ref >60)
GFR, EST NON AFRICAN AMERICAN: 35 mL/min — AB (ref >60)
Glucose, Bld: 122 mg/dL — ABNORMAL HIGH (ref 70–99)
POTASSIUM: 3.9 mmol/L (ref 3.5–5.1)
SODIUM: 137 mmol/L (ref 135–145)

## 2018-04-04 LAB — CBC
HCT: 46.3 % (ref 39.0–52.0)
Hemoglobin: 14.3 g/dL (ref 13.0–17.0)
MCH: 27.5 pg (ref 26.0–34.0)
MCHC: 30.9 g/dL (ref 30.0–36.0)
MCV: 89 fL (ref 80.0–100.0)
NRBC: 0 % (ref 0.0–0.2)
PLATELETS: 248 10*3/uL (ref 150–400)
RBC: 5.2 MIL/uL (ref 4.22–5.81)
RDW: 12.9 % (ref 11.5–15.5)
WBC: 9 10*3/uL (ref 4.0–10.5)

## 2018-04-04 LAB — GLUCOSE, CAPILLARY
Glucose-Capillary: 111 mg/dL — ABNORMAL HIGH (ref 70–99)
Glucose-Capillary: 123 mg/dL — ABNORMAL HIGH (ref 70–99)

## 2018-04-04 LAB — COOXEMETRY PANEL
Carboxyhemoglobin: 1.6 % — ABNORMAL HIGH (ref 0.5–1.5)
Methemoglobin: 1 % (ref 0.0–1.5)
O2 SAT: 57.3 %
Total hemoglobin: 14.5 g/dL (ref 12.0–16.0)

## 2018-04-04 LAB — MAGNESIUM: Magnesium: 2.5 mg/dL — ABNORMAL HIGH (ref 1.7–2.4)

## 2018-04-04 MED ORDER — RANOLAZINE ER 500 MG PO TB12: 500.0000 mg | ORAL_TABLET | Freq: Two times a day (BID) | ORAL | 5 refills | Status: DC

## 2018-04-04 MED ORDER — TORSEMIDE 20 MG PO TABS: 60.0000 mg | ORAL_TABLET | Freq: Every day | ORAL | 5 refills | Status: DC

## 2018-04-04 MED ORDER — TORSEMIDE 20 MG PO TABS: 60.0000 mg | ORAL_TABLET | Freq: Every day | ORAL | Status: DC

## 2018-04-04 MED ORDER — FLUTICASONE-SALMETEROL 100-50 MCG/DOSE IN AEPB: 1.0000 | INHALATION_SPRAY | Freq: Two times a day (BID) | RESPIRATORY_TRACT | 0 refills | Status: DC

## 2018-04-04 MED ORDER — TORSEMIDE 20 MG PO TABS
20.0000 mg | ORAL_TABLET | Freq: Once | ORAL | Status: AC
  Administered 2018-04-04: 20 mg via ORAL
  Filled 2018-04-04: qty 1

## 2018-04-04 MED ORDER — ASPIRIN 81 MG PO CHEW: 81.0000 mg | CHEWABLE_TABLET | Freq: Every day | ORAL | 0 refills | Status: DC

## 2018-04-04 MED ORDER — APIXABAN 5 MG PO TABS: 5.0000 mg | ORAL_TABLET | Freq: Two times a day (BID) | ORAL | 5 refills | Status: DC

## 2018-04-04 MED ORDER — SPIRONOLACTONE 25 MG PO TABS: 25.0000 mg | ORAL_TABLET | Freq: Every day | ORAL | 5 refills | Status: DC

## 2018-04-04 MED ORDER — FEXOFENADINE HCL 30 MG PO TABS: 30.0000 mg | ORAL_TABLET | Freq: Every day | ORAL | 5 refills | Status: DC

## 2018-04-04 MED ORDER — TORSEMIDE 20 MG PO TABS: 40.0000 mg | ORAL_TABLET | Freq: Every day | ORAL | 5 refills | Status: DC

## 2018-04-04 MED ORDER — AMIODARONE HCL 400 MG PO TABS: 400.0000 mg | ORAL_TABLET | Freq: Two times a day (BID) | ORAL | 3 refills | Status: DC

## 2018-04-04 MED ORDER — CARVEDILOL 6.25 MG PO TABS: 6.2500 mg | ORAL_TABLET | Freq: Two times a day (BID) | ORAL | 5 refills | Status: DC

## 2018-04-04 MED ORDER — DIGOXIN 125 MCG PO TABS: 0.1250 mg | ORAL_TABLET | Freq: Every day | ORAL | 5 refills | Status: DC

## 2018-04-04 NOTE — Progress Notes (Addendum)
Patient ID: David Huynh, male   DOB: 1940-11-11, 78 y.o.   MRN: [id number]     Advanced Heart Failure Rounding Note  PCP-Cardiologist: [name], MD   Subjective:    Underwent DC-CV on 11/15. Was successful but reverted back to AF soon afterward.  DCCV 11/19 but did not convert to NSR at all.   Now on PO amio. Ranexa added 11/16. Remains in afib 80-100s  Creatinine 1.95 => 1.86 => 1.88 => 1.85 => 1.79.  Remains on torsemide. Coox 57%. CVP not working today.   S/P PCI to prox left circ 11/20. Back on Eliquis + plavix.   Denies CP, SOB , or dizziness. SBP 100-130s  LHC 04/03/18: Left Main  Short, no significant disease.  Left Anterior Descending  Diffuse disease in the LAD. 40% proximal stenosis, 50% mid vessel stenosis, 50% distal LAD stenosis.  Left Circumflex  80-85% stenosis in the proximal LCx prior to the take-off of a large branching OM1. The superior branch of OM1 has a 90% ostial/proximal stenosis (small to moderate vessel). The AV LCx is smaller in caliber than the OM1.  Right Coronary Artery  Patent RCA stent, luminal irregularities.   RHC 04/03/18: RA mean 10 RV 40/8 PA 45/21, mean 28 PCWP mean 18 AO 100/59 Oxygen saturations: PA 61% AO 96% Cardiac Output (Fick) 4.54  Cardiac Index (Fick) 1.93  1. Mildly elevated left and right heart filling pressures.  2. Low cardiac output, but measured by Fick and oxygen saturations fluctuated significantly.  3. 80-85% proximal LCx stenosis, large vessel.  90% ostial/proximal stenosis of the a branch of OM1.  This branch is relatively small in caliber, will not intervene.  Will plan PCI to proximal LCx given large territory at risk.   Objective:   Weight Range: 115 kg Body mass index is 34.38 kg/m.   Vital Signs:   Temp:  [97.3 F (36.3 C)-97.9 F (36.6 C)] 97.6 F (36.4 C) (11/21 0225) Pulse Rate:  [39-126] 92 (11/21 0225) Resp:  [8-29] 19 (11/21 0225) BP: (102-165)/(69-105) 105/72 (11/21 0225) SpO2:   [86 %-99 %] 99 % (11/21 0225) Weight:  [762 kg] 115 kg (11/21 0225) Last BM Date: 04/01/18  Weight change: Filed Weights   04/02/18 0454 04/03/18 0434 04/04/18 0225  Weight: 113.5 kg 114.7 kg 115 kg    Intake/Output:   Intake/Output Summary (Last 24 hours) at 04/04/2018 0805 Last data filed at 04/04/2018 0615 Gross per 24 hour  Intake 886.32 ml  Output 1500 ml  Net -613.68 ml      Physical Exam    General: No resp difficulty. HEENT: Normal Neck: Supple. JVP 5-6. Carotids 2+ bilat; no bruits. No thyromegaly or nodule noted. Cor: PMI nondisplaced. IRR, No M/G/R noted Lungs: CTAB, normal effort. Abdomen: Soft, non-tender, non-distended, no HSM. No bruits or masses. +BS  Extremities: No cyanosis, clubbing, or rash. R and LLE no edema. Right radial with small, soft knot.  Neuro: Alert & orientedx3, cranial nerves grossly intact. moves all 4 extremities w/o difficulty. Affect pleasant  Telemetry   Afib 80-100s. personally reviewed.   EKG    No new tracings.    Labs    CBC Recent Labs    04/02/18 0434 04/04/18 0337  WBC 9.8 9.0  NEUTROABS 6.3  --   HGB 14.5 14.3  HCT 46.4 46.3  MCV 90.3 89.0  PLT 207 248   Basic Metabolic Panel Recent Labs    26/33/35 0232 04/04/18 0337  NA 135 137  K  4.4 3.9  CL 95* 99  CO2 27 28  GLUCOSE 118* 122*  BUN 32* 30*  CREATININE 1.85* 1.79*  CALCIUM 8.9 8.5*  MG 2.4 2.5*   Liver Function Tests No results for input(s): AST, ALT, ALKPHOS, BILITOT, PROT, ALBUMIN in the last 72 hours. No results for input(s): LIPASE, AMYLASE in the last 72 hours. Cardiac Enzymes No results for input(s): CKTOTAL, CKMB, CKMBINDEX, TROPONINI in the last 72 hours.  BNP: BNP (last 3 results) Recent Labs    03/25/18 1225  BNP 381.9*    ProBNP (last 3 results) No results for input(s): PROBNP in the last 8760 hours.   D-Dimer No results for input(s): DDIMER in the last 72 hours. Hemoglobin A1C No results for input(s): HGBA1C in the  last 72 hours. Fasting Lipid Panel No results for input(s): CHOL, HDL, LDLCALC, TRIG, CHOLHDL, LDLDIRECT in the last 72 hours. Thyroid Function Tests No results for input(s): TSH, T4TOTAL, T3FREE, THYROIDAB in the last 72 hours.  Invalid input(s): FREET3  Other results:   Imaging    No results found.   Medications:     Scheduled Medications: . amiodarone  400 mg Oral BID  . apixaban  5 mg Oral BID  . aspirin  81 mg Oral Daily  . atorvastatin  80 mg Oral q1800  . carvedilol  6.25 mg Oral BID WC  . clopidogrel  75 mg Oral Q breakfast  . digoxin  0.125 mg Oral Daily  . glimepiride  1 mg Oral BID  . insulin aspart  0-9 Units Subcutaneous TID WC  . loratadine  10 mg Oral Daily  . mometasone-formoterol  2 puff Inhalation BID  . ranolazine  500 mg Oral BID  . sodium chloride flush  3 mL Intravenous Q12H  . sodium chloride flush  3 mL Intravenous Q12H  . sodium chloride flush  3 mL Intravenous Q12H  . spironolactone  25 mg Oral Daily  . torsemide  40 mg Oral Daily    Infusions: . sodium chloride    . sodium chloride    . sodium chloride Stopped (04/04/18 0000)  . sodium chloride      PRN Medications: sodium chloride, sodium chloride, acetaminophen, diazepam, hydrocortisone cream, levalbuterol, nitroGLYCERIN, ondansetron (ZOFRAN) IV, sodium chloride flush, sodium chloride flush, sodium chloride flush, sodium chloride flush    Patient Profile   David Huynh is a 77 y.o. male with a history of CAD, chronic combined HF, HTN, HLD, DM, CKD 3, and asthma.  He presented to [facility] with SOB. Found to be in afib RVR. Repeat echo showed reduced EF to 35-40% (prior echo EF 55-60%).  Assessment/Plan   1. Acute systolic CHF: Prior ischemic cardiomyopathy, improved after PCI.  Echo this admission with EF back down to 35-40% and moderate-severe RV dysfunction.  He has had no chest pain, only dyspnea.  TnI only slightly elevated around 0.04 with no trend.  Cannot rule  out worsening coronary disease, but do not think acute or recent ACS.  It is possible that this is a tachycardia-mediated CMP (also has significant RV dysfunction), not sure how long he has been in atrial fibrillation as he does not feel palpitations. Co-ox stable at 57%, CVP not working, but volume looks stable on exam.  Creatinine 1.79. - Continue torsemide 40 mg daily.  - Continue digoxin 0.125 mg daily.  - Continue spironolactone 25 mg daily.  - No ACEi/ARB/Entresto yet with elevated creatinine.  - Continue Coreg 6.25 mg bid.  Will  not increase today with marginal coox.  - Continue TED hose - Need to get him out of atrial fibrillation ideally but failed DCCV x 2 now. - Dr Shirlee Latch discussed with Dr Johney Frame and planned [facility] yesterday. Now s/p PCI to left circ. See below.  2. Atrial fibrillation: Noted for the first time this admission.  Not sure how long he has been in afib as he does not feel palpitations. Has been short of breath for around 3 wks.  As above, concerned for possible tachy-mediated CMP, HR still 100s-110s.  Had successful DC-CV on 11/15 but then back in atrial fibrillation.  Ranolazine added, but failed DCCV again 11/19.  - Currently on ranolazine + amiodarone, will transition to po amiodarone.  - Discussed with Dr. Johney Frame: Had LHC yesterday with PCI to prox left circ. May be cause of worsening HF (vs tachy-mediated). Eliquis resumed this am. Plan to continue to load amiodarone for another 2-3 weeks then repeat DCCV.  If fails again, will re-address future steps with EP.  3. AKI: ?Baseline. Creatinine 1.5->1.7->1.95 -> 1.86 => 1.88 => 1.85 => 1.79.  4. CAD: History of DES to RCA in 9/17.  This admission, TnI mildly elevated at 0.04 with no trend => suspect demand ischemia with volume overload.  LHC yesterday. Now s/p PCI to prox left circ. On plavix + Eliquis. No s/s ischemia.  - No ASA with stable CAD on Eliquis - Continue statin.  6. DM2: Ideally would get off glimepiride with CAD  and switch to metformin +/- SGLT2 once renal function more stable. No change.   Can probably DC today with planned DCCV in 2-3 weeks. Will discuss with Dr Shirlee Latch.   Length of Stay: [address], NP  04/04/2018, 8:05 AM  Advanced Heart Failure Team Pager [phone] (M-F; 7a - 4p)  Please contact [facility] for night-coverage after hours (4p -7a ) and weekends on [email or web address]  Patient seen with NP, agree with the above note.    CVP 11-12 today with co-ox 57%.  He feels good, wants to go home.  No dyspnea walking in hall. Had PCI yesterday with DES to LCx.    On exam, no edema.  JVP 8-9 cm.  Clear lungs.  Irregular S1S2.   I will let him go home today.  He will be on Eliquis + Plavix + ASA 81 x 1 month then will stop ASA.  He will need Plavix at least 6 months, can continue for a year if stable.   With some volume excess still, will increase torsemide to 60 mg daily for home.   He will continue amiodarone + ranexa.  In 2 wks, I will re-attempt DCCV.  Will need TEE as Eliquis was held for cath.  If this fails, will discuss further steps with Dr. Johney Frame.   He needs followup in CHF clinic.  Meds for home: amiodarone 400 mg bid x 1 week then 200 mg bid x 1 week then 200 mg daily, ASA 81 x 1 month only, Plavix 75 mg daily, Coreg 6.25 mg bid, ranolazine 500 mg bid, digoxin 0.125 daily, torsemide 60 mg daily, spironolactone 25 mg daily, atorvastatin 80 mg daily  [name] 04/04/2018 10:32 AM

## 2018-04-04 NOTE — Care Management (Signed)
#   3.   S/W SASHA  @ [facility]  RX # [phone]  1. RANEXA   500 MG BID COVER- YES CO-PAY- $ 30.00 TIER- 3 DRUG PRIOR APPROVAL- NO  2. RANOLAZINE  500 MG BID COVER- YES CO-PAY- $ 30.00 TIER-  3 DRUG PRIOR APPROVAL- NO  PREFERRED PHARMACY : YES [facility]  AND [facility] M/O 90 DAY SUPPLY FOR  M/O OR RETAIL $ 90.00

## 2018-04-04 NOTE — Progress Notes (Signed)
CARDIAC REHAB PHASE I   PRE:  Rate/Rhythm: 105 Afib  BP:  Sitting: 138/90      SaO2: 95 RA  MODE:  Ambulation: 500 ft    139 peak HR  POST:  Rate/Rhythm: 108 Afib  BP:  Sitting: 131/76    SaO2: 97 RA   Pt ambulated 512ft in hallway assist of one with gait belt and front wheel walker. Pt denies CP, does c/o some SOB. Pt educated on importance of Eliquis, ASA, Plavix, statin, and NTG. Stent card at bedside. Pt given heart healthy, diabetic, and low sodium diets. Heart failure book given to pt and reviewed. Reviewed restrictions and exercise guidelines. Reinforced importance of med adherence, and listening to his symptoms. Will refer to CRP II [place], unsure if pt will attend.   [id number] Reynold Bowen, RN BSN 04/04/2018 9:03 AM

## 2018-04-04 NOTE — Discharge Summary (Addendum)
Advanced Heart Failure Discharge Note  Discharge Summary   Patient ID: David Huynh MRN: [id number], DOB/AGE: 1940-12-22 77 y.o. Admit date: 03/25/2018 D/C date:     04/04/2018   Primary Discharge Diagnoses:  1. Acute on chonic systolic HF 2. Atrial fibrillation (new this admit) - DCCV failed x2 - On amio 400 mg BID x 1 week, then 200 mg BID x1 week, then 200 mg daily. Also on Ranexa - Repeat DCCV 12/6 3. AKI on CKD 4. CAD - S/p PCI to prox left circ 04/03/18 - On plavix + Eliquis + ASA. Drop ASA after 30 days.  5. DM2  Hospital Course:  David Ickes Carlsonis a 77 y.o.malewith a history of CAD, chronic combined HF, HTN, HLD, DM, CKD 3, and asthma.  He presented to [facility] with SOB. Found to be in afib RVR. Repeat echo showed reduced EF to 35-40% (prior echo EF 55-60%). He was started on IV amio. Failed DCCV x 2. EP consulted and added ranexa in addition to amiodarone. LHC completed and underwent PCI to prox left circ. Amio switched to PO after appropriate time. Will set up for repeat TEE/DCCV as an outpatient in 2 weeks.   1. Acute systolic CHF: Prior ischemic cardiomyopathy, improved after PCI. Echo this admission with EF back down to 35-40% and moderate-severe RV dysfunction. He has had no chest pain, only dyspnea. TnI only slightly elevated around 0.04 with no trend. Cannot rule out worsening coronary disease, but do not think acute or recent ACS. It is possible that this is a tachycardia-mediated CMP (also has significant RV dysfunction), not sure how long he has been in atrial fibrillation as he does not feel palpitations. Diuresed with IV lasix, then transitioned to torsemide 60 mg daily. HF medications optimized.  - Continue digoxin 0.125 mg daily.  - Continue spironolactone 25 mg daily.  - No ACEi/ARB/Entresto yet with elevated creatinine.  - Continue Coreg 6.25 mg bid.  - Need to get him out of atrial fibrillation ideally but failed DCCV x 2 now. - Dr Shirlee Latch  discussed with Dr Johney Frame and planned [facility] yesterday. Now s/p PCI to left circ. See below.  2. Atrial fibrillation: Noted for the first time this admission. Not sure how long he has been in afib as he does not feel palpitations. Has been short of breath for around 3 wks. As above, concerned for possible tachy-mediated CMP, HR still 100s-110s. Had successful DC-CV on 11/15 but then back in atrial fibrillation.  Ranolazine added, but failed DCCV again 11/19.  - Currently on ranolazine+ amio 400 mg BID. Will decrease to 200 mg BID x 1 week, then 200 mg daily. - Eliquis was held for cath. Will need to repeat TEE prior to DCCV because of this. - He is scheduled for TEE/DCCV 12/6 at 1 pm with Dr Shirlee Latch 3. AKI: ?Baseline. Monitored closely. Creatinine peaked at 1.95. 1.79 on day of DC 4. CAD: History of DES to RCA in 9/17. This admission, TnI mildly elevated at 0.04 with no trend =>suspect demand ischemia with volume overload. Underwent LHC 11/20. Now s/p PCI to prox left circ.  - On ASA + plavix + Eliquis.Can drop ASA after 30 days - Continue statin.  6. DM2: Ideally would get off glimepiride with CAD and switch to metformin +/- SGLT2 once renal function more stable.   He will be followed closely in HF clinic, with appointment as below. He has been set up for TEE/DCCV on 12/6.  Discharge Weight Range:  253 lbs Discharge Vitals: Blood pressure (!) 123/98, pulse 89, temperature 97.7 F (36.5 C), temperature source Oral, resp. rate (!) 22, height 6' (1.829 m), weight 115 kg, SpO2 97 %.  Labs: Lab Results  Component Value Date   WBC 9.0 04/04/2018   HGB 14.3 04/04/2018   HCT 46.3 04/04/2018   MCV 89.0 04/04/2018   PLT 248 04/04/2018    Recent Labs  Lab 04/04/18 0337  NA 137  K 3.9  CL 99  CO2 28  BUN 30*  CREATININE 1.79*  CALCIUM 8.5*  GLUCOSE 122*   Lab Results  Component Value Date   CHOL 155 04/02/2017   HDL 37 (L) 04/02/2017   LDLCALC 84 04/02/2017   TRIG 168 (H) 04/02/2017     BNP (last 3 results) Recent Labs    03/25/18 1225  BNP 381.9*    ProBNP (last 3 results) No results for input(s): PROBNP in the last 8760 hours.   Diagnostic Studies/Procedures   LHC 04/03/18: Left Main  Short, no significant disease.  Left Anterior Descending  Diffuse disease in the LAD. 40% proximal stenosis, 50% mid vessel stenosis, 50% distal LAD stenosis.  Left Circumflex  80-85% stenosis in the proximal LCx prior to the take-off of a large branching OM1. The superior branch of OM1 has a 90% ostial/proximal stenosis (small to moderate vessel). The AV LCx is smaller in caliber than the OM1.  Right Coronary Artery  Patent RCA stent, luminal irregularities.   RHC 04/03/18: RA mean 10 RV 40/8 PA 45/21, mean 28 PCWP mean 18 AO 100/59 Oxygen saturations: PA 61% AO 96% Cardiac Output (Fick) 4.54  Cardiac Index (Fick) 1.93  1. Mildly elevated left and right heart filling pressures.  2. Low cardiac output, but measured by Fick and oxygen saturations fluctuated significantly.  3. 80-85% proximal LCx stenosis, large vessel. 90% ostial/proximal stenosis of the a branch of OM1. This branch is relatively small in caliber, will not intervene. Will plan PCI to proximal LCx given large territory at risk.    Discharge Medications   Allergies as of 04/04/2018   No Known Allergies     Medication List    STOP taking these medications   amLODipine 10 MG tablet Commonly known as:  NORVASC   furosemide 40 MG tablet Commonly known as:  LASIX   lisinopril 40 MG tablet Commonly known as:  PRINIVIL,ZESTRIL   PRIMATENE MIST 0.125 MG/ACT Aero Generic drug:  EPINEPHrine   VENTOLIN HFA 108 (90 Base) MCG/ACT inhaler Generic drug:  albuterol     TAKE these medications   acetaminophen 500 MG tablet Commonly known as:  TYLENOL Take 500 mg by mouth every 8 (eight) hours as needed (for pain or headaches).   amiodarone 400 MG tablet Commonly known as:  PACERONE Take 1  tablet (400 mg total) by mouth 2 (two) times daily.   apixaban 5 MG Tabs tablet Commonly known as:  ELIQUIS Take 1 tablet (5 mg total) by mouth 2 (two) times daily.   aspirin 81 MG chewable tablet Chew 1 tablet (81 mg total) by mouth daily. Take for 30 days after stent, then can stop and continue plavix and Eliquis Start taking on:  04/05/2018   atorvastatin 80 MG tablet Commonly known as:  LIPITOR Take 1 tablet (80 mg total) by mouth daily.   carvedilol 6.25 MG tablet Commonly known as:  COREG Take 1 tablet (6.25 mg total) by mouth 2 (two) times daily with a meal. What changed:  medication strength  how much to take   cholecalciferol 1000 units tablet Commonly known as:  VITAMIN D Take 1,000 Units by mouth daily.   clopidogrel 75 MG tablet Commonly known as:  PLAVIX Take 1 tablet (75 mg total) by mouth daily with breakfast.   digoxin 0.125 MG tablet Commonly known as:  LANOXIN Take 1 tablet (0.125 mg total) by mouth daily.   fexofenadine 30 MG tablet Commonly known as:  ALLEGRA Take 1 tablet (30 mg total) by mouth daily. What changed:  Another medication with the same name was removed. Continue taking this medication, and follow the directions you see here.   Fluticasone-Salmeterol 100-50 MCG/DOSE Aepb Commonly known as:  ADVAIR Inhale 1 puff into the lungs 2 (two) times daily. What changed:  See the new instructions.   glimepiride 1 MG tablet Commonly known as:  AMARYL Take 1 tablet (1 mg total) by mouth 2 (two) times daily.   metFORMIN 1000 MG tablet Commonly known as:  GLUCOPHAGE Take 1 tablet (1,000 mg total) by mouth 2 (two) times daily with a meal.   nitroGLYCERIN 0.4 MG SL tablet Commonly known as:  NITROSTAT Place 1 tablet (0.4 mg total) under the tongue every 5 (five) minutes as needed for chest pain.   ranolazine 500 MG 12 hr tablet Commonly known as:  RANEXA Take 1 tablet (500 mg total) by mouth 2 (two) times daily.   spironolactone 25 MG  tablet Commonly known as:  ALDACTONE Take 1 tablet (25 mg total) by mouth daily. Start taking on:  04/05/2018   torsemide 20 MG tablet Commonly known as:  DEMADEX Take 3 tablets (60 mg total) by mouth daily. Start taking on:  04/05/2018       Disposition   The patient will be discharged in stable condition to home. Discharge Instructions    (HEART FAILURE PATIENTS) Call MD:  Anytime you have any of the following symptoms: 1) 3 pound weight gain in 24 hours or 5 pounds in 1 week 2) shortness of breath, with or without a dry hacking cough 3) swelling in the hands, feet or stomach 4) if you have to sleep on extra pillows at night in order to breathe.   Complete by:  As directed    Amb Referral to Cardiac Rehabilitation   Complete by:  As directed    Diagnosis:  Coronary Stents   Call MD for:  persistant dizziness or light-headedness   Complete by:  As directed    Call MD for:  redness, tenderness, or signs of infection (pain, swelling, redness, odor or green/yellow discharge around incision site)   Complete by:  As directed    Diet - low sodium heart healthy   Complete by:  As directed    Heart Failure patients record your daily weight using the same scale at the same time of day   Complete by:  As directed    Increase activity slowly   Complete by:  As directed    STOP any activity that causes chest pain, shortness of breath, dizziness, sweating, or exessive weakness   Complete by:  As directed      Follow-up Information    [facility]. Go on 04/10/2018.   Specialty:  Cardiology Why:  at 1000 AM in the Advanced Heart Failure CLinic.  Please bring all medications to appt.  gate code is 1800 for November. Contact information: [address] [id number] mc [place] [address] [phone]  Duration of Discharge Encounter: Greater than 35 minutes   Signed, Alford Highland, NP 04/04/2018, 11:08  AM

## 2018-04-05 ENCOUNTER — Telehealth: Payer: Self-pay

## 2018-04-05 ENCOUNTER — Other Ambulatory Visit (HOSPITAL_COMMUNITY): Payer: Self-pay

## 2018-04-05 ENCOUNTER — Encounter (HOSPITAL_COMMUNITY): Payer: Self-pay

## 2018-04-05 MED ORDER — CLOPIDOGREL BISULFATE 75 MG PO TABS: 75.0000 mg | ORAL_TABLET | Freq: Every day | ORAL | 3 refills | Status: DC

## 2018-04-05 MED ORDER — TORSEMIDE 20 MG PO TABS: 60.0000 mg | ORAL_TABLET | Freq: Every day | ORAL | 5 refills | Status: DC

## 2018-04-05 NOTE — Care Management Note (Signed)
Case Management Note  Patient Details  Name: David Huynh MRN: [id number] Date of Birth: 10-30-1940  Subjective/Objective:   NCM gave patient the 30 day savings card for eliqius and gave him the co pay amt for eliquis/ranexa which is 30.00.                   Action/Plan: DC to home .  Expected Discharge Date:  04/04/18               Expected Discharge Plan:  Home/Self Care  In-House Referral:  NA  Discharge planning Services  CM Consult, Medication Assistance  Post Acute Care Choice:  NA Choice offered to:  NA  DME Arranged:  N/A DME Agency:  NA  HH Arranged:  NA HH Agency:  NA  Status of Service:  Completed, signed off  If discussed at Long Length of Stay Meetings, dates discussed:    Additional Comments:  Leone Haven, RN 04/05/2018, 9:13 AM

## 2018-04-05 NOTE — Telephone Encounter (Signed)
Copied from CRM [id number]. Topic: Quick Communication - See Telephone Encounter >> Apr 04, 2018  3:26 PM Waymon Amato wrote: [facility] is calling to let Dr. Abner Greenspan know that pt should be calling to schedule a TOC and a  hospital follow concerning diabetes and if pt is needing help with managing things at home to call [phone]    -Dr. Abner Greenspan  patient has been gone for 2 years and was being seen by another provider not in the system.  Please advise

## 2018-04-07 NOTE — Telephone Encounter (Signed)
He can reestablish if he so chooses but it is up to him. If he contacts [place] to schedule appointment

## 2018-04-09 ENCOUNTER — Telehealth (HOSPITAL_COMMUNITY): Payer: Self-pay

## 2018-04-09 NOTE — Progress Notes (Signed)
Advanced Heart Failure Clinic Note   PCP: Blyth, Stacey A, MD PCP-Cardiologist: David Harding, MD  HF: Dr McLean  HPI: David Huynh is a 77 y.o. male with a history of CAD, chronic combined HF, HTN, HLD, DM, CKD 3, and asthma. Patient had EF 35-40% in 9/17, had cath with DES to RCA then repeat echo with EF up to 55-60% by 3/18.   Admitted 03/2018 with SOB x 3 weeks. No cardiology follow up in >1 year. Found to be in Afib RVR. EF down to 35-40% (previous echo 55-50%). Poor diuresis with IV lasix, so HF team consulted. He was started on amio drip. PICC line placed with CVP markedly elevated and Coox marginal. Held off on inotropes with concern for making afib worse. Started on lasix drip. He started eliquis and had TEE/DCCV 11/15. Was converted to NSR, but shortly after went back into AF. Ranexa added. Attempted repeat DCCV 11/19, but was unsuccessful. EP consulted and recommended repeat LHC prior to repeat DCCV (postponed originally due to need for Eliquis with DCCV). Had LHC 11/20 with PCI to prox left circ. Planned to continue amio load with DCCV in 2 weeks after DC. Diuresed 30+ lbs and transitioned to torsemide 60 mg daily. DC weight 253 lbs.   He returns today for post hospital follow up with his wife. Overall doing well. Denies SOB except with stairs. Fatigued after walking a lot. Denies orthopnea, PND, or edema. Denies CP, palpitations, or dizziness. No cough. No bleeding on Eliquis. No missed doses of medications. Limits fluid and salt intake. Cooks most of his own meals. Appetite improving. Weights ~251 lbs at home. Not interested in cardiac rehab due to how far away it is from his house.   Scheduled for TEE/DCCV 12/6 with Dr McLean.   SH: No tobacco or drug use. Rare ETOH use. Lives at home with his wife in [place]. 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  FH: No family hx of CAD or CHF.   Review of systems complete and found to be negative unless listed in HPI.   Past Medical History:    Diagnosis Date  . Allergy    seasonal  . Asthma    mild, intermittent  . CAD, multiple vessel    Three-vessel disease involving mid RCA 85% (PCI with DES), OM 2 80% in branch and ostD1 ~90%. Only the mid RCA with PCI target.   . Chronic combined systolic and diastolic CHF (congestive heart failure) (HCC)   . CKD (chronic kidney disease), stage III (HCC)   . Diabetes mellitus    type 2  . Fracture of multiple ribs 02/12/2015  . Hyperlipidemia   . Hypertension   . Hypocalcemia 02/12/2015  . Liver function study, abnormal 04/26/2011  . Multiple rib fractures   . Obesity   . Persistent atrial fibrillation     Current Outpatient Medications  Medication Sig Dispense Refill  . acetaminophen (TYLENOL) 500 MG tablet Take 500 mg by mouth every 8 (eight) hours as needed (for pain or headaches).     . amiodarone (PACERONE) 400 MG tablet Take 1 tablet (400 mg total) by mouth 2 (two) times daily. 60 tablet 3  . apixaban (ELIQUIS) 5 MG TABS tablet Take 1 tablet (5 mg total) by mouth 2 (two) times daily. 60 tablet 5  . aspirin 81 MG chewable tablet Chew 1 tablet (81 mg total) by mouth daily. Take for 30 days after stent, then can stop and continue plavix and Eliquis 30 tablet 0  .   carvedilol (COREG) 6.25 MG tablet Take 1 tablet (6.25 mg total) by mouth 2 (two) times daily with a meal. 60 tablet 5  . cholecalciferol (VITAMIN D) 1000 units tablet Take 1,000 Units by mouth daily.    . clopidogrel (PLAVIX) 75 MG tablet Take 1 tablet (75 mg total) by mouth daily with breakfast. 90 tablet 3  . digoxin (LANOXIN) 0.125 MG tablet Take 1 tablet (0.125 mg total) by mouth daily. 30 tablet 5  . fexofenadine (ALLEGRA) 30 MG tablet Take 1 tablet (30 mg total) by mouth daily. 30 tablet 5  . Fluticasone-Salmeterol (ADVAIR DISKUS) 100-50 MCG/DOSE AEPB Inhale 1 puff into the lungs 2 (two) times daily. 1 each 0  . glimepiride (AMARYL) 1 MG tablet Take 1 tablet (1 mg total) by mouth 2 (two) times daily. 60 tablet 3  .  metFORMIN (GLUCOPHAGE) 1000 MG tablet Take 1 tablet (1,000 mg total) by mouth 2 (two) times daily with a meal. 60 tablet 0  . nitroGLYCERIN (NITROSTAT) 0.4 MG SL tablet Place 1 tablet (0.4 mg total) under the tongue every 5 (five) minutes as needed for chest pain. 25 tablet 2  . ranolazine (RANEXA) 500 MG 12 hr tablet Take 1 tablet (500 mg total) by mouth 2 (two) times daily. 60 tablet 5  . spironolactone (ALDACTONE) 25 MG tablet Take 1 tablet (25 mg total) by mouth daily. 30 tablet 5  . torsemide (DEMADEX) 20 MG tablet Take 3 tablets (60 mg total) by mouth daily. 90 tablet 5  . atorvastatin (LIPITOR) 80 MG tablet Take 1 tablet (80 mg total) by mouth daily. 90 tablet 3   No current facility-administered medications for this encounter.     No Known Allergies    Social History   Socioeconomic History  . Marital status: Married    Spouse name: Not on file  . Number of children: Not on file  . Years of education: Not on file  . Highest education level: Not on file  Occupational History  . Not on file  Social Needs  . Financial resource strain: Not on file  . Food insecurity:    Worry: Not on file    Inability: Not on file  . Transportation needs:    Medical: Not on file    Non-medical: Not on file  Tobacco Use  . Smoking status: Never Smoker  . Smokeless tobacco: Never Used  Substance and Sexual Activity  . Alcohol use: Yes    Comment: rare use now  . Drug use: No  . Sexual activity: Not on file  Lifestyle  . Physical activity:    Days per week: Not on file    Minutes per session: Not on file  . Stress: Not on file  Relationships  . Social connections:    Talks on phone: Not on file    Gets together: Not on file    Attends religious service: Not on file    Active member of club or organization: Not on file    Attends meetings of clubs or organizations: Not on file    Relationship status: Not on file  . Intimate partner violence:    Fear of current or ex partner: Not  on file    Emotionally abused: Not on file    Physically abused: Not on file    Forced sexual activity: Not on file  Other Topics Concern  . Not on file  Social History Narrative  . Not on file      Family History    Problem Relation Age of Onset  . CAD Neg Hx     Vitals:   04/10/18 0930  BP: 102/70  Pulse: 94  SpO2: 97%  Weight: 113.8 kg (250 lb 12.8 oz)   Wt Readings from Last 3 Encounters:  04/10/18 113.8 kg (250 lb 12.8 oz)  04/04/18 115 kg (253 lb 8.5 oz)  01/09/17 123.7 kg (272 lb 12.8 oz)     PHYSICAL EXAM: General:  Well appearing. No respiratory difficulty HEENT: normal Neck: supple. no JVD. Carotids 2+ bilat; no bruits. No lymphadenopathy or thyromegaly appreciated. Cor: PMI nondisplaced. Irregular rate & rhythm. No rubs, gallops or murmurs. Lungs: clear Abdomen: soft, nontender, nondistended. No hepatosplenomegaly. No bruits or masses. Good bowel sounds. Extremities: no cyanosis, clubbing, rash, edema. Bruising to right wrist (from cath). No hematoma. Neuro: alert & oriented x 3, cranial nerves grossly intact. moves all 4 extremities w/o difficulty. Affect pleasant.  ECG: Afib 85 bpm   ASSESSMENT & PLAN:  1. Chronic systolic CHF: Prior ischemic cardiomyopathy, improved after PCI. Echo 03/2018 with EF back down to 35-40% and moderate-severe RV dysfunction. It is possible that this is a tachycardia-mediated CMP vs new ischemia (also has significant RV dysfunction).  - NYHA II - Volume status stable on exam.  - Continue torsemide 60 mg daily. Check BMET today. - Continue digoxin 0.125 mg daily. Check dig level today.  - Continue spironolactone 25 mg daily.  - No ACEi/ARB/Entresto yet with elevated creatinine.  -ContinueCoreg 6.25 mg bid. - Discussed importance of daily weights and limiting fluid and salt intake. Discussed slowly working up activity level.  2. Atrial fibrillation: As above, concerned for possible tachy-mediated CMP. Had successful  DC-CV on 11/15 but then back in atrial fibrillation.Ranolazine added, but failed DCCV again 11/19.  - Currently on ranolazine+ amio 400 mg BID. Decrease amio to 200 mg BID x 1 week, then 200 mg daily. Check TFTs and LFTs today - Eliquis was held for cath. Will need to repeat TEE prior to DCCV because of this. - He is scheduled for TEE/DCCV 12/6 at 1 pm with Dr McLean. No missed doses of Eliquis.  3. CKD: With recent AKI. ?Baseline. BMET today 4. CAD: History of DES to RCA in 9/17. Underwent LHC 11/20 with PCI to prox left circ.  - On ASA + plavix + Eliquis.Can drop ASA after 30 days (05/03/18) - Continue statin.  - No s/s ischemia.  6. DM2: Ideally would get off glimepiride with CAD and switch to metformin +/- SGLT2 once renal function more stable.BMET today. Follow up with PCP.  Decrease amio to 200 mg BID x 1 week, then 200 mg daily CMET, TFTs, dig level today Follow up in 3 weeks after DCCV.  Providencia Hottenstein M Andrewjames Weirauch, NP 04/10/18   Greater than 50% of the 25 minute visit was spent in counseling/coordination of care regarding disease state education, salt/fluid restriction, sliding scale diuretics, and medication compliance.   

## 2018-04-09 NOTE — Telephone Encounter (Signed)
Pt insurance is active and benefits verified through [employer]. Co-pay $0.00, DED $185.00/$185.00 met, out of pocket $0.00/$0.00 met, co-insurance 20%.  No pre-authorization required. Passport, 04/09/18 @ 2:55PM, REF# [id number]  2ndary insurance is active and benefits verified through [facility]. Co-pay $0.00, DED $0.00/$0.00 met, out of pocket $0.00/$0.00 met, co-insurance 0%.  No pre-authorization required. Passport, 04/09/18 @ 2:57PM, REF# [id number]  Will contact patient to see if he is interested in the Cardiac Rehab Program. If interested, patient will need to complete follow up appt. Once completed, patient will be contacted for scheduling upon review by the RN Navigator.

## 2018-04-09 NOTE — Telephone Encounter (Signed)
Attempted to call patient in regards to Cardiac Rehab - LM on VM 

## 2018-04-09 NOTE — H&P (View-Only) (Signed)
Advanced Heart Failure Clinic Note   PCP: Bradd Canary, MD PCP-Cardiologist: [name], MD  HF: Dr Shirlee Latch  HPI: David Huynh is a 77 y.o. male with a history of CAD, chronic combined HF, HTN, HLD, DM, CKD 3, and asthma. Patient had EF 35-40% in 9/17, had cath with DES to RCA then repeat echo with EF up to 55-60% by 3/18.   Admitted 03/2018 with SOB x 3 weeks. No cardiology follow up in >1 year. Found to be in Afib RVR. EF down to 35-40% (previous echo 55-50%). Poor diuresis with IV lasix, so HF team consulted. He was started on amio drip. PICC line placed with CVP markedly elevated and Coox marginal. Held off on inotropes with concern for making afib worse. Started on lasix drip. He started eliquis and had TEE/DCCV 11/15. Was converted to NSR, but shortly after went back into AF. Ranexa added. Attempted repeat DCCV 11/19, but was unsuccessful. EP consulted and recommended repeat LHC prior to repeat DCCV (postponed originally due to need for Eliquis with DCCV). Had [facility] 11/20 with PCI to prox left circ. Planned to continue amio load with DCCV in 2 weeks after DC. Diuresed 30+ lbs and transitioned to torsemide 60 mg daily. DC weight 253 lbs.   He returns today for post hospital follow up with his wife. Overall doing well. Denies SOB except with stairs. Fatigued after walking a lot. Denies orthopnea, PND, or edema. Denies CP, palpitations, or dizziness. No cough. No bleeding on Eliquis. No missed doses of medications. Limits fluid and salt intake. Cooks most of his own meals. Appetite improving. Weights ~251 lbs at home. Not interested in cardiac rehab due to how far away it is from his house.   Scheduled for TEE/DCCV 12/6 with Dr Shirlee Latch.   SH: No tobacco or drug use. Rare ETOH use. Lives at home with his wife in [place]. 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  FH: No family hx of CAD or CHF.   Review of systems complete and found to be negative unless listed in HPI.   Past Medical History:    Diagnosis Date  . Allergy    seasonal  . Asthma    mild, intermittent  . CAD, multiple vessel    Three-vessel disease involving mid RCA 85% (PCI with DES), OM 2 80% in branch and ostD1 ~90%. Only the mid RCA with PCI target.   . Chronic combined systolic and diastolic CHF (congestive heart failure) (HCC)   . CKD (chronic kidney disease), stage III (HCC)   . Diabetes mellitus    type 2  . Fracture of multiple ribs 02/12/2015  . Hyperlipidemia   . Hypertension   . Hypocalcemia 02/12/2015  . Liver function study, abnormal 04/26/2011  . Multiple rib fractures   . Obesity   . Persistent atrial fibrillation     Current Outpatient Medications  Medication Sig Dispense Refill  . acetaminophen (TYLENOL) 500 MG tablet Take 500 mg by mouth every 8 (eight) hours as needed (for pain or headaches).     Marland Kitchen amiodarone (PACERONE) 400 MG tablet Take 1 tablet (400 mg total) by mouth 2 (two) times daily. 60 tablet 3  . apixaban (ELIQUIS) 5 MG TABS tablet Take 1 tablet (5 mg total) by mouth 2 (two) times daily. 60 tablet 5  . aspirin 81 MG chewable tablet Chew 1 tablet (81 mg total) by mouth daily. Take for 30 days after stent, then can stop and continue plavix and Eliquis 30 tablet 0  .  carvedilol (COREG) 6.25 MG tablet Take 1 tablet (6.25 mg total) by mouth 2 (two) times daily with a meal. 60 tablet 5  . cholecalciferol (VITAMIN D) 1000 units tablet Take 1,000 Units by mouth daily.    . clopidogrel (PLAVIX) 75 MG tablet Take 1 tablet (75 mg total) by mouth daily with breakfast. 90 tablet 3  . digoxin (LANOXIN) 0.125 MG tablet Take 1 tablet (0.125 mg total) by mouth daily. 30 tablet 5  . fexofenadine (ALLEGRA) 30 MG tablet Take 1 tablet (30 mg total) by mouth daily. 30 tablet 5  . Fluticasone-Salmeterol (ADVAIR DISKUS) 100-50 MCG/DOSE AEPB Inhale 1 puff into the lungs 2 (two) times daily. 1 each 0  . glimepiride (AMARYL) 1 MG tablet Take 1 tablet (1 mg total) by mouth 2 (two) times daily. 60 tablet 3  .  metFORMIN (GLUCOPHAGE) 1000 MG tablet Take 1 tablet (1,000 mg total) by mouth 2 (two) times daily with a meal. 60 tablet 0  . nitroGLYCERIN (NITROSTAT) 0.4 MG SL tablet Place 1 tablet (0.4 mg total) under the tongue every 5 (five) minutes as needed for chest pain. 25 tablet 2  . ranolazine (RANEXA) 500 MG 12 hr tablet Take 1 tablet (500 mg total) by mouth 2 (two) times daily. 60 tablet 5  . spironolactone (ALDACTONE) 25 MG tablet Take 1 tablet (25 mg total) by mouth daily. 30 tablet 5  . torsemide (DEMADEX) 20 MG tablet Take 3 tablets (60 mg total) by mouth daily. 90 tablet 5  . atorvastatin (LIPITOR) 80 MG tablet Take 1 tablet (80 mg total) by mouth daily. 90 tablet 3   No current facility-administered medications for this encounter.     No Known Allergies    Social History   Socioeconomic History  . Marital status: Married    Spouse name: Not on file  . Number of children: Not on file  . Years of education: Not on file  . Highest education level: Not on file  Occupational History  . Not on file  Social Needs  . Financial resource strain: Not on file  . Food insecurity:    Worry: Not on file    Inability: Not on file  . Transportation needs:    Medical: Not on file    Non-medical: Not on file  Tobacco Use  . Smoking status: Never Smoker  . Smokeless tobacco: Never Used  Substance and Sexual Activity  . Alcohol use: Yes    Comment: rare use now  . Drug use: No  . Sexual activity: Not on file  Lifestyle  . Physical activity:    Days per week: Not on file    Minutes per session: Not on file  . Stress: Not on file  Relationships  . Social connections:    Talks on phone: Not on file    Gets together: Not on file    Attends religious service: Not on file    Active member of club or organization: Not on file    Attends meetings of clubs or organizations: Not on file    Relationship status: Not on file  . Intimate partner violence:    Fear of current or ex partner: Not  on file    Emotionally abused: Not on file    Physically abused: Not on file    Forced sexual activity: Not on file  Other Topics Concern  . Not on file  Social History Narrative  . Not on file      Family History  Problem Relation Age of Onset  . CAD Neg Hx     Vitals:   04/10/18 0930  BP: 102/70  Pulse: 94  SpO2: 97%  Weight: 113.8 kg (250 lb 12.8 oz)   Wt Readings from Last 3 Encounters:  04/10/18 113.8 kg (250 lb 12.8 oz)  04/04/18 115 kg (253 lb 8.5 oz)  01/09/17 123.7 kg (272 lb 12.8 oz)     PHYSICAL EXAM: General:  Well appearing. No respiratory difficulty HEENT: normal Neck: supple. no JVD. Carotids 2+ bilat; no bruits. No lymphadenopathy or thyromegaly appreciated. Cor: PMI nondisplaced. Irregular rate & rhythm. No rubs, gallops or murmurs. Lungs: clear Abdomen: soft, nontender, nondistended. No hepatosplenomegaly. No bruits or masses. Good bowel sounds. Extremities: no cyanosis, clubbing, rash, edema. Bruising to right wrist (from cath). No hematoma. Neuro: alert & oriented x 3, cranial nerves grossly intact. moves all 4 extremities w/o difficulty. Affect pleasant.  ECG: Afib 85 bpm   ASSESSMENT & PLAN:  1. Chronic systolic CHF: Prior ischemic cardiomyopathy, improved after PCI. Echo 03/2018 with EF back down to 35-40% and moderate-severe RV dysfunction. It is possible that this is a tachycardia-mediated CMP vs new ischemia (also has significant RV dysfunction).  - NYHA II - Volume status stable on exam.  - Continue torsemide 60 mg daily. Check BMET today. - Continue digoxin 0.125 mg daily. Check dig level today.  - Continue spironolactone 25 mg daily.  - No ACEi/ARB/Entresto yet with elevated creatinine.  -ContinueCoreg 6.25 mg bid. - Discussed importance of daily weights and limiting fluid and salt intake. Discussed slowly working up activity level.  2. Atrial fibrillation: As above, concerned for possible tachy-mediated CMP. Had successful  DC-CV on 11/15 but then back in atrial fibrillation.Ranolazine added, but failed DCCV again 11/19.  - Currently on ranolazine+ amio 400 mg BID. Decrease amio to 200 mg BID x 1 week, then 200 mg daily. Check TFTs and LFTs today - Eliquis was held for cath. Will need to repeat TEE prior to DCCV because of this. - He is scheduled for TEE/DCCV 12/6 at 1 pm with Dr Shirlee Latch. No missed doses of Eliquis.  3. CKD: With recent AKI. ?Baseline. BMET today 4. CAD: History of DES to RCA in 9/17. Underwent LHC 11/20 with PCI to prox left circ.  - On ASA + plavix + Eliquis.Can drop ASA after 30 days (05/03/18) - Continue statin.  - No s/s ischemia.  6. DM2: Ideally would get off glimepiride with CAD and switch to metformin +/- SGLT2 once renal function more stable.BMET today. Follow up with PCP.  Decrease amio to 200 mg BID x 1 week, then 200 mg daily CMET, TFTs, dig level today Follow up in 3 weeks after DCCV.  Alford Highland, NP 04/10/18   Greater than 50% of the 25 minute visit was spent in counseling/coordination of care regarding disease state education, salt/fluid restriction, sliding scale diuretics, and medication compliance.

## 2018-04-09 NOTE — Telephone Encounter (Signed)
Pt returned CR phone call and stated he is not interested in the program at this time.  Closed referral

## 2018-04-10 ENCOUNTER — Encounter (HOSPITAL_COMMUNITY): Payer: Self-pay

## 2018-04-10 ENCOUNTER — Ambulatory Visit (HOSPITAL_COMMUNITY)
Admission: RE | Admit: 2018-04-10 | Discharge: 2018-04-10 | Disposition: A | Discharge status: Home / Self Care | Payer: Medicare Other | Source: Ambulatory Visit | Attending: Internal Medicine | Admitting: Internal Medicine

## 2018-04-10 ENCOUNTER — Other Ambulatory Visit: Payer: Self-pay

## 2018-04-10 VITALS — BP 102/70 | HR 94 | Wt 250.8 lb

## 2018-04-10 DIAGNOSIS — I5022 Chronic systolic (congestive) heart failure: Secondary | ICD-10-CM | POA: Diagnosis not present

## 2018-04-10 DIAGNOSIS — I2510 Atherosclerotic heart disease of native coronary artery without angina pectoris: Secondary | ICD-10-CM | POA: Diagnosis not present

## 2018-04-10 DIAGNOSIS — I4891 Unspecified atrial fibrillation: Secondary | ICD-10-CM | POA: Diagnosis not present

## 2018-04-10 DIAGNOSIS — I130 Hypertensive heart and chronic kidney disease with heart failure and stage 1 through stage 4 chronic kidney disease, or unspecified chronic kidney disease: Secondary | ICD-10-CM | POA: Diagnosis not present

## 2018-04-10 DIAGNOSIS — Z8249 Family history of ischemic heart disease and other diseases of the circulatory system: Secondary | ICD-10-CM | POA: Insufficient documentation

## 2018-04-10 DIAGNOSIS — Z955 Presence of coronary angioplasty implant and graft: Secondary | ICD-10-CM | POA: Insufficient documentation

## 2018-04-10 DIAGNOSIS — Z7901 Long term (current) use of anticoagulants: Secondary | ICD-10-CM | POA: Insufficient documentation

## 2018-04-10 DIAGNOSIS — I10 Essential (primary) hypertension: Secondary | ICD-10-CM | POA: Diagnosis not present

## 2018-04-10 DIAGNOSIS — Z7984 Long term (current) use of oral hypoglycemic drugs: Secondary | ICD-10-CM | POA: Insufficient documentation

## 2018-04-10 DIAGNOSIS — Z7902 Long term (current) use of antithrombotics/antiplatelets: Secondary | ICD-10-CM | POA: Diagnosis not present

## 2018-04-10 DIAGNOSIS — E119 Type 2 diabetes mellitus without complications: Secondary | ICD-10-CM | POA: Diagnosis not present

## 2018-04-10 DIAGNOSIS — I255 Ischemic cardiomyopathy: Secondary | ICD-10-CM | POA: Diagnosis not present

## 2018-04-10 DIAGNOSIS — E1122 Type 2 diabetes mellitus with diabetic chronic kidney disease: Secondary | ICD-10-CM | POA: Diagnosis not present

## 2018-04-10 DIAGNOSIS — Z7982 Long term (current) use of aspirin: Secondary | ICD-10-CM | POA: Insufficient documentation

## 2018-04-10 DIAGNOSIS — E785 Hyperlipidemia, unspecified: Secondary | ICD-10-CM | POA: Insufficient documentation

## 2018-04-10 DIAGNOSIS — Z79899 Other long term (current) drug therapy: Secondary | ICD-10-CM | POA: Diagnosis not present

## 2018-04-10 DIAGNOSIS — N183 Chronic kidney disease, stage 3 unspecified: Secondary | ICD-10-CM

## 2018-04-10 DIAGNOSIS — J45909 Unspecified asthma, uncomplicated: Secondary | ICD-10-CM | POA: Diagnosis not present

## 2018-04-10 DIAGNOSIS — I482 Chronic atrial fibrillation, unspecified: Secondary | ICD-10-CM | POA: Diagnosis not present

## 2018-04-10 DIAGNOSIS — E669 Obesity, unspecified: Secondary | ICD-10-CM | POA: Insufficient documentation

## 2018-04-10 DIAGNOSIS — I5042 Chronic combined systolic (congestive) and diastolic (congestive) heart failure: Secondary | ICD-10-CM | POA: Diagnosis not present

## 2018-04-10 LAB — COMPREHENSIVE METABOLIC PANEL
ALBUMIN: 3.6 g/dL (ref 3.5–5.0)
ALT: 62 U/L — ABNORMAL HIGH (ref 0–44)
AST: 47 U/L — AB (ref 15–41)
Alkaline Phosphatase: 90 U/L (ref 38–126)
Anion gap: 13 (ref 5–15)
BUN: 44 mg/dL — ABNORMAL HIGH (ref 8–23)
CHLORIDE: 97 mmol/L — AB (ref 98–111)
CO2: 23 mmol/L (ref 22–32)
CREATININE: 2.05 mg/dL — AB (ref 0.61–1.24)
Calcium: 9.3 mg/dL (ref 8.9–10.3)
GFR calc non Af Amer: 31 mL/min — ABNORMAL LOW (ref >60)
GFR, EST AFRICAN AMERICAN: 35 mL/min — AB (ref >60)
GLUCOSE: 296 mg/dL — AB (ref 70–99)
Potassium: 4.4 mmol/L (ref 3.5–5.1)
SODIUM: 133 mmol/L — AB (ref 135–145)
Total Bilirubin: 1.2 mg/dL (ref 0.3–1.2)
Total Protein: 7.5 g/dL (ref 6.5–8.1)

## 2018-04-10 LAB — DIGOXIN LEVEL: DIGOXIN LVL: 1 ng/mL (ref 0.8–2.0)

## 2018-04-10 LAB — T4, FREE: Free T4: 1.1 ng/dL (ref 0.82–1.77)

## 2018-04-10 NOTE — Patient Instructions (Addendum)
Decrease Amiodarone to 200mg  twice daily for 1 week, then decrease Amiodarone to 200mg  daily.  Stop Aspirin 05/03/2018.  Routine lab work today. Will notify you of abnormal results  Follow up in 3 weeks.

## 2018-04-11 LAB — T3, FREE: T3, Free: 2.1 pg/mL (ref 2.0–4.4)

## 2018-04-19 ENCOUNTER — Ambulatory Visit (HOSPITAL_COMMUNITY): Payer: Medicare Other | Admitting: Anesthesiology

## 2018-04-19 ENCOUNTER — Ambulatory Visit (HOSPITAL_COMMUNITY)
Admission: RE | Admit: 2018-04-19 | Discharge: 2018-04-19 | Disposition: A | Discharge status: Home / Self Care | Payer: Medicare Other | Source: Ambulatory Visit | Attending: Cardiology | Admitting: Cardiology

## 2018-04-19 ENCOUNTER — Other Ambulatory Visit: Payer: Self-pay

## 2018-04-19 ENCOUNTER — Telehealth (HOSPITAL_COMMUNITY): Payer: Self-pay | Admitting: Cardiology

## 2018-04-19 ENCOUNTER — Encounter (HOSPITAL_COMMUNITY): Payer: Self-pay | Admitting: *Deleted

## 2018-04-19 ENCOUNTER — Ambulatory Visit (HOSPITAL_BASED_OUTPATIENT_CLINIC_OR_DEPARTMENT_OTHER): Payer: Medicare Other

## 2018-04-19 ENCOUNTER — Encounter (HOSPITAL_COMMUNITY)
Admission: RE | Disposition: A | Discharge status: Home / Self Care | Payer: Self-pay | Source: Ambulatory Visit | Attending: Cardiology

## 2018-04-19 DIAGNOSIS — E1122 Type 2 diabetes mellitus with diabetic chronic kidney disease: Secondary | ICD-10-CM | POA: Insufficient documentation

## 2018-04-19 DIAGNOSIS — I4891 Unspecified atrial fibrillation: Secondary | ICD-10-CM

## 2018-04-19 DIAGNOSIS — J45909 Unspecified asthma, uncomplicated: Secondary | ICD-10-CM | POA: Insufficient documentation

## 2018-04-19 DIAGNOSIS — I255 Ischemic cardiomyopathy: Secondary | ICD-10-CM | POA: Diagnosis not present

## 2018-04-19 DIAGNOSIS — N183 Chronic kidney disease, stage 3 (moderate): Secondary | ICD-10-CM | POA: Insufficient documentation

## 2018-04-19 DIAGNOSIS — I5042 Chronic combined systolic (congestive) and diastolic (congestive) heart failure: Secondary | ICD-10-CM | POA: Insufficient documentation

## 2018-04-19 DIAGNOSIS — Z955 Presence of coronary angioplasty implant and graft: Secondary | ICD-10-CM | POA: Insufficient documentation

## 2018-04-19 DIAGNOSIS — I2510 Atherosclerotic heart disease of native coronary artery without angina pectoris: Secondary | ICD-10-CM | POA: Diagnosis not present

## 2018-04-19 DIAGNOSIS — Z7984 Long term (current) use of oral hypoglycemic drugs: Secondary | ICD-10-CM | POA: Diagnosis not present

## 2018-04-19 DIAGNOSIS — Z79899 Other long term (current) drug therapy: Secondary | ICD-10-CM | POA: Diagnosis not present

## 2018-04-19 DIAGNOSIS — E669 Obesity, unspecified: Secondary | ICD-10-CM | POA: Insufficient documentation

## 2018-04-19 DIAGNOSIS — I5043 Acute on chronic combined systolic (congestive) and diastolic (congestive) heart failure: Secondary | ICD-10-CM

## 2018-04-19 DIAGNOSIS — Z7902 Long term (current) use of antithrombotics/antiplatelets: Secondary | ICD-10-CM | POA: Diagnosis not present

## 2018-04-19 DIAGNOSIS — Z7901 Long term (current) use of anticoagulants: Secondary | ICD-10-CM | POA: Insufficient documentation

## 2018-04-19 DIAGNOSIS — Z6834 Body mass index (BMI) 34.0-34.9, adult: Secondary | ICD-10-CM | POA: Diagnosis not present

## 2018-04-19 DIAGNOSIS — I110 Hypertensive heart disease with heart failure: Secondary | ICD-10-CM | POA: Diagnosis not present

## 2018-04-19 DIAGNOSIS — I051 Rheumatic mitral insufficiency: Secondary | ICD-10-CM | POA: Diagnosis not present

## 2018-04-19 DIAGNOSIS — Z7982 Long term (current) use of aspirin: Secondary | ICD-10-CM | POA: Insufficient documentation

## 2018-04-19 DIAGNOSIS — N179 Acute kidney failure, unspecified: Secondary | ICD-10-CM | POA: Insufficient documentation

## 2018-04-19 DIAGNOSIS — I340 Nonrheumatic mitral (valve) insufficiency: Secondary | ICD-10-CM | POA: Diagnosis not present

## 2018-04-19 DIAGNOSIS — I5022 Chronic systolic (congestive) heart failure: Secondary | ICD-10-CM

## 2018-04-19 DIAGNOSIS — I130 Hypertensive heart and chronic kidney disease with heart failure and stage 1 through stage 4 chronic kidney disease, or unspecified chronic kidney disease: Secondary | ICD-10-CM | POA: Diagnosis not present

## 2018-04-19 DIAGNOSIS — Z7951 Long term (current) use of inhaled steroids: Secondary | ICD-10-CM | POA: Insufficient documentation

## 2018-04-19 DIAGNOSIS — E785 Hyperlipidemia, unspecified: Secondary | ICD-10-CM | POA: Diagnosis not present

## 2018-04-19 HISTORY — PX: TEE WITHOUT CARDIOVERSION: SHX5443

## 2018-04-19 HISTORY — PX: CARDIOVERSION: SHX1299

## 2018-04-19 LAB — BASIC METABOLIC PANEL
Anion gap: 15 (ref 5–15)
BUN: 57 mg/dL — ABNORMAL HIGH (ref 8–23)
CO2: 25 mmol/L (ref 22–32)
Calcium: 10.2 mg/dL (ref 8.9–10.3)
Chloride: 94 mmol/L — ABNORMAL LOW (ref 98–111)
Creatinine, Ser: 2.41 mg/dL — ABNORMAL HIGH (ref 0.61–1.24)
GFR calc Af Amer: 29 mL/min — ABNORMAL LOW (ref >60)
GFR calc non Af Amer: 25 mL/min — ABNORMAL LOW (ref >60)
Glucose, Bld: 285 mg/dL — ABNORMAL HIGH (ref 70–99)
Potassium: 5.5 mmol/L — ABNORMAL HIGH (ref 3.5–5.1)
Sodium: 134 mmol/L — ABNORMAL LOW (ref 135–145)

## 2018-04-19 LAB — GLUCOSE, CAPILLARY
Glucose-Capillary: 299 mg/dL — ABNORMAL HIGH (ref 70–99)
Glucose-Capillary: 249 mg/dL — ABNORMAL HIGH (ref 70–99)
Glucose-Capillary: 226 mg/dL — ABNORMAL HIGH (ref 70–99)

## 2018-04-19 LAB — DIGOXIN LEVEL: Digoxin Level: 2.2 ng/mL — ABNORMAL HIGH (ref 0.8–2.0)

## 2018-04-19 SURGERY — ECHOCARDIOGRAM, TRANSESOPHAGEAL
Anesthesia: Monitor Anesthesia Care

## 2018-04-19 MED ORDER — TORSEMIDE 20 MG PO TABS: 40.0000 mg | ORAL_TABLET | Freq: Every day | ORAL | 5 refills | Status: DC

## 2018-04-19 MED ORDER — PROPOFOL 500 MG/50ML IV EMUL
INTRAVENOUS | Status: DC | PRN
  Administered 2018-04-19: 100 ug/kg/min via INTRAVENOUS

## 2018-04-19 MED ORDER — SODIUM CHLORIDE 0.9 % IV SOLN
INTRAVENOUS | Status: DC
  Administered 2018-04-19: 500 mL via INTRAVENOUS

## 2018-04-19 MED ORDER — SODIUM CHLORIDE 0.9 % IV SOLN
INTRAVENOUS | Status: DC | PRN
  Administered 2018-04-19: 14:00:00 via INTRAVENOUS

## 2018-04-19 MED ORDER — PROPOFOL 10 MG/ML IV BOLUS
INTRAVENOUS | Status: DC | PRN
  Administered 2018-04-19: 50 mg via INTRAVENOUS

## 2018-04-19 MED ORDER — INSULIN ASPART 100 UNIT/ML ~~LOC~~ SOLN
SUBCUTANEOUS | Status: DC | PRN
  Administered 2018-04-19: 8 [IU] via SUBCUTANEOUS

## 2018-04-19 NOTE — Procedures (Signed)
Electrical Cardioversion Procedure Note David Huynh [id number] [birth year]  Procedure: Electrical Cardioversion Indications:  Atrial Fibrillation  Procedure Details Consent: Risks of procedure as well as the alternatives and risks of each were explained to the (patient/caregiver).  Consent for procedure obtained. Time Out: Verified patient identification, verified procedure, site/side was marked, verified correct patient position, special equipment/implants available, medications/allergies/relevent history reviewed, required imaging and test results available.  Performed  Patient placed on cardiac monitor, pulse oximetry, supplemental oxygen as necessary.  Sedation given: Propofol per anesthesiology Pacer pads placed anterior and posterior chest.  Cardioverted 3 times, using sternal pressure with the 2nd and 3rd cardioversions.  Cardioverted at 200J.  Evaluation Findings: Post procedure EKG shows: NSR Complications: None Patient did tolerate procedure well.   [name] 04/19/2018, 1:58 PM

## 2018-04-19 NOTE — Anesthesia Procedure Notes (Signed)
Procedure Name: MAC Date/Time: 04/19/2018 1:33 PM Performed by: Leonor Liv, CRNA Pre-anesthesia Checklist: Patient identified, Emergency Drugs available, Suction available, Patient being monitored and Timeout performed Oxygen Delivery Method: Nasal cannula Airway Equipment and Method: Bite block Placement Confirmation: positive ETCO2 Dental Injury: Teeth and Oropharynx as per pre-operative assessment

## 2018-04-19 NOTE — Anesthesia Preprocedure Evaluation (Addendum)
Anesthesia Evaluation  Patient identified by MRN, date of birth, ID band Patient awake    Reviewed: Allergy & Precautions, NPO status , Patient's Chart, lab work & pertinent test results  Airway Mallampati: II  TM Distance: >3 FB Neck ROM: Full    Dental no notable dental hx. (+) Teeth Intact   Pulmonary asthma ,    Pulmonary exam normal breath sounds clear to auscultation       Cardiovascular hypertension, Pt. on medications + CAD and +CHF  Normal cardiovascular exam Rhythm:Regular Rate:Normal     Neuro/Psych negative neurological ROS  negative psych ROS   GI/Hepatic negative GI ROS, Neg liver ROS,   Endo/Other  diabetes  Renal/GU Renal disease     Musculoskeletal negative musculoskeletal ROS (+)   Abdominal (+) + obese,   Peds  Hematology negative hematology ROS (+)   Anesthesia Other Findings   Reproductive/Obstetrics                             Anesthesia Physical Anesthesia Plan  ASA: IV  Anesthesia Plan: MAC   Post-op Pain Management:    Induction: Intravenous  PONV Risk Score and Plan: Treatment may vary due to age or medical condition, Dexamethasone and Ondansetron  Airway Management Planned: Oral ETT and Natural Airway  Additional Equipment:   Intra-op Plan:   Post-operative Plan:   Informed Consent: I have reviewed the patients History and Physical, chart, labs and discussed the procedure including the risks, benefits and alternatives for the proposed anesthesia with the patient or authorized representative who has indicated his/her understanding and acceptance.   Dental advisory given  Plan Discussed with:   Anesthesia Plan Comments:         Anesthesia Quick Evaluation

## 2018-04-19 NOTE — CV Procedure (Signed)
Procedure: TEE  Indication: Atrial fibrillation  Sedation: Per anesthesiology  Findings: Please see echo section for full report.  Normal LV size with mild LV hypertrophy.  EF 40%, diffuse hypokinesis.  Normal RV size and systolic function.  Moderate left atrial enlargement with no LA appendage thrombus.  Mild right atrium enlargement.  No ASD/PFO by color doppler.  Trileaflet, moderately calcified aortic valve.  Sclerosis without significant stenosis. Trivial aortic insufficiency.  Mild mitral regurgitation.  Normal caliber thoracic aorta with grade 3 plaque in the descending thoracic aorta.   May proceed to DCCV.   [name] 04/19/2018 1:57 PM

## 2018-04-19 NOTE — Telephone Encounter (Signed)
Pt aware via Ashley,NP

## 2018-04-19 NOTE — Discharge Instructions (Signed)
TEE  YOU HAD AN CARDIAC PROCEDURE TODAY: Refer to the procedure report and other information in the discharge instructions given to you for any specific questions about what was found during the examination. If this information does not answer your questions, please call [facility] office at [phone] to clarify.   DIET: Your first meal following the procedure should be a light meal and then it is ok to progress to your normal diet. A half-sandwich or bowl of soup is an example of a good first meal. Heavy or fried foods are harder to digest and may make you feel nauseous or bloated. Drink plenty of fluids but you should avoid alcoholic beverages for 24 hours. If you had a esophageal dilation, please see attached instructions for diet.   ACTIVITY: Your care partner should take you home directly after the procedure. You should plan to take it easy, moving slowly for the rest of the day. You can resume normal activity the day after the procedure however YOU SHOULD NOT DRIVE, use power tools, machinery or perform tasks that involve climbing or major physical exertion for 24 hours (because of the sedation medicines used during the test).   SYMPTOMS TO REPORT IMMEDIATELY: A cardiologist can be reached at any hour. Please call [phone] for any of the following symptoms:  Vomiting of blood or coffee ground material  New, significant abdominal pain  New, significant chest pain or pain under the shoulder blades  Painful or persistently difficult swallowing  New shortness of breath  Black, tarry-looking or red, bloody stools  FOLLOW UP:  Please also call with any specific questions about appointments or follow up tests.   Monitored Anesthesia Care Anesthesia is a term that refers to techniques, procedures, and medicines that help a person stay safe and comfortable during a medical procedure. Monitored anesthesia care, or sedation, is one type of anesthesia. Your anesthesia specialist may  recommend sedation if you will be having a procedure that does not require you to be unconscious, such as:  Cataract surgery.  A dental procedure.  A biopsy.  A colonoscopy.  During the procedure, you may receive a medicine to help you relax (sedative). There are three levels of sedation:  Mild sedation. At this level, you may feel awake and relaxed. You will be able to follow directions.  Moderate sedation. At this level, you will be sleepy. You may not remember the procedure.  Deep sedation. At this level, you will be asleep. You will not remember the procedure.  The more medicine you are given, the deeper your level of sedation will be. Depending on how you respond to the procedure, the anesthesia specialist may change your level of sedation or the type of anesthesia to fit your needs. An anesthesia specialist will monitor you closely during the procedure. Let your health care provider know about:  Any allergies you have.  All medicines you are taking, including vitamins, herbs, eye drops, creams, and over-the-counter medicines.  Any use of steroids (by mouth or as a cream).  Any problems you or family members have had with sedatives and anesthetic medicines.  Any blood disorders you have.  Any surgeries you have had.  Any medical conditions you have, such as sleep apnea.  Whether you are pregnant or may be pregnant.  Any use of cigarettes, alcohol, or street drugs. What are the risks? Generally, this is a safe procedure. However, problems may occur, including:  Getting too much medicine (oversedation).  Nausea.  Allergic reaction to medicines.  Trouble breathing. If this happens, a breathing tube may be used to help with breathing. It will be removed when you are awake and breathing on your own.  Heart trouble.  Lung trouble.  Before the procedure Staying hydrated Follow instructions from your health care provider about hydration, which may include:  Up to  2 hours before the procedure - you may continue to drink clear liquids, such as water, clear fruit juice, black coffee, and plain tea.  Eating and drinking restrictions Follow instructions from your health care provider about eating and drinking, which may include:  8 hours before the procedure - stop eating heavy meals or foods such as meat, fried foods, or fatty foods.  6 hours before the procedure - stop eating light meals or foods, such as toast or cereal.  6 hours before the procedure - stop drinking milk or drinks that contain milk.  2 hours before the procedure - stop drinking clear liquids.  Medicines Ask your health care provider about:  Changing or stopping your regular medicines. This is especially important if you are taking diabetes medicines or blood thinners.  Taking medicines such as aspirin and ibuprofen. These medicines can thin your blood. Do not take these medicines before your procedure if your health care provider instructs you not to.  Tests and exams  You will have a physical exam.  You may have blood tests done to show: ? How well your kidneys and liver are working. ? How well your blood can clot.  General instructions  Plan to have someone take you home from the hospital or clinic.  If you will be going home right after the procedure, plan to have someone with you for 24 hours.  What happens during the procedure?  Your blood pressure, heart rate, breathing, level of pain and overall condition will be monitored.  An IV tube will be inserted into one of your veins.  Your anesthesia specialist will give you medicines as needed to keep you comfortable during the procedure. This may mean changing the level of sedation.  The procedure will be performed. After the procedure  Your blood pressure, heart rate, breathing rate, and blood oxygen level will be monitored until the medicines you were given have worn off.  Do not drive for 24 hours if you  received a sedative.  You may: ? Feel sleepy, clumsy, or nauseous. ? Feel forgetful about what happened after the procedure. ? Have a sore throat if you had a breathing tube during the procedure. ? Vomit. This information is not intended to replace advice given to you by your health care provider. Make sure you discuss any questions you have with your health care provider. Document Released: 01/25/2005 Document Revised: 10/08/2015 Document Reviewed: 08/22/2015 Elsevier Interactive Patient Education  2018 [employer].  Electrical Cardioversion, Care After This sheet gives you information about how to care for yourself after your procedure. Your health care provider may also give you more specific instructions. If you have problems or questions, contact your health care provider. What can I expect after the procedure? After the procedure, it is common to have:  Some redness on the skin where the shocks were given.  Follow these instructions at home:  Do not drive for 24 hours if you were given a medicine to help you relax (sedative).  Take over-the-counter and prescription medicines only as told by your health care provider.  Ask your health care provider how to check your pulse. Check it often.  Rest for 48 hours after the procedure or as told by your health care provider.  Avoid or limit your caffeine use as told by your health care provider. Contact a health care provider if:  You feel like your heart is beating too quickly or your pulse is not regular.  You have a serious muscle cramp that does not go away. Get help right away if:  You have discomfort in your chest.  You are dizzy or you feel faint.  You have trouble breathing or you are short of breath.  Your speech is slurred.  You have trouble moving an arm or leg on one side of your body.  Your fingers or toes turn cold or blue. This information is not intended to replace advice given to you by your health care  provider. Make sure you discuss any questions you have with your health care provider. Document Released: 02/19/2013 Document Revised: 12/03/2015 Document Reviewed: 11/05/2015 Elsevier Interactive Patient Education  [employer].

## 2018-04-19 NOTE — Transfer of Care (Signed)
Immediate Anesthesia Transfer of Care Note  Patient: David Huynh  Procedure(s) Performed: TRANSESOPHAGEAL ECHOCARDIOGRAM (TEE) (N/A ) CARDIOVERSION (N/A )  Patient Location: Endoscopy Unit  Anesthesia Type:MAC  Level of Consciousness: awake, alert  and oriented  Airway & Oxygen Therapy: Patient Spontanous Breathing and Patient connected to nasal cannula oxygen  Post-op Assessment: Report given to RN, Post -op Vital signs reviewed and stable and Patient moving all extremities  Post vital signs: Reviewed and stable  Last Vitals:  Vitals Value Taken Time  BP    Temp    Pulse    Resp    SpO2      Last Pain:  Vitals:   04/19/18 1051  TempSrc: Oral  PainSc: 0-No pain         Complications: No apparent anesthesia complications

## 2018-04-19 NOTE — Anesthesia Postprocedure Evaluation (Signed)
Anesthesia Post Note  Patient: FIN HUPP  Procedure(s) Performed: TRANSESOPHAGEAL ECHOCARDIOGRAM (TEE) (N/A ) CARDIOVERSION (N/A )     Patient location during evaluation: Endoscopy Anesthesia Type: MAC Level of consciousness: awake and alert Pain management: pain level controlled Vital Signs Assessment: post-procedure vital signs reviewed and stable Respiratory status: spontaneous breathing, nonlabored ventilation, respiratory function stable and patient connected to nasal cannula oxygen Cardiovascular status: stable and blood pressure returned to baseline Postop Assessment: no apparent nausea or vomiting Anesthetic complications: no    Last Vitals:  Vitals:   04/19/18 1425 04/19/18 1435  BP: 131/72 118/65  Pulse: 72 67  Resp: 15 16  Temp:    SpO2: 99% 97%    Last Pain:  Vitals:   04/19/18 1435  TempSrc:   PainSc: 0-No pain                 Barnet Glasgow

## 2018-04-19 NOTE — Telephone Encounter (Signed)
-----   Message from Alford Highland, NP sent at 04/19/2018 12:21 PM EST ----- Creatinine up to 2.4 and K is up to 5.5. Please have him stop spiro. Make sure he is not taking any K supps and is staying away from high potassium foods and Mrs dash. Decrease torsemide to 40 mg daily. Dig level is also elevated. Have him stop digoxin. Repeat BMET Monday to make sure potassium is coming down. Thank you.

## 2018-04-19 NOTE — Interval H&P Note (Signed)
History and Physical Interval Note:  04/19/2018 1:41 PM  David Huynh  has presented today for surgery, with the diagnosis of AFIB  The various methods of treatment have been discussed with the patient and family. After consideration of risks, benefits and other options for treatment, the patient has consented to  Procedure(s): TRANSESOPHAGEAL ECHOCARDIOGRAM (TEE) (N/A) CARDIOVERSION (N/A) as a surgical intervention .  The patient's history has been reviewed, patient examined, no change in status, stable for surgery.  I have reviewed the patient's chart and labs.  Questions were answered to the patient's satisfaction.     [name] [employer]

## 2018-04-22 ENCOUNTER — Other Ambulatory Visit (HOSPITAL_COMMUNITY): Payer: Medicare Other

## 2018-04-22 ENCOUNTER — Ambulatory Visit (HOSPITAL_COMMUNITY)
Admission: RE | Admit: 2018-04-22 | Discharge: 2018-04-22 | Disposition: A | Discharge status: Home / Self Care | Payer: Medicare Other | Source: Ambulatory Visit | Attending: Cardiology | Admitting: Cardiology

## 2018-04-22 ENCOUNTER — Encounter (HOSPITAL_COMMUNITY): Payer: Self-pay | Admitting: Cardiology

## 2018-04-22 DIAGNOSIS — I5022 Chronic systolic (congestive) heart failure: Secondary | ICD-10-CM | POA: Insufficient documentation

## 2018-04-22 LAB — BASIC METABOLIC PANEL
Anion gap: 13 (ref 5–15)
BUN: 40 mg/dL — ABNORMAL HIGH (ref 8–23)
CO2: 25 mmol/L (ref 22–32)
Calcium: 9.1 mg/dL (ref 8.9–10.3)
Chloride: 97 mmol/L — ABNORMAL LOW (ref 98–111)
Creatinine, Ser: 1.81 mg/dL — ABNORMAL HIGH (ref 0.61–1.24)
GFR calc Af Amer: 41 mL/min — ABNORMAL LOW (ref >60)
GFR calc non Af Amer: 36 mL/min — ABNORMAL LOW (ref >60)
Glucose, Bld: 324 mg/dL — ABNORMAL HIGH (ref 70–99)
Potassium: 4.6 mmol/L (ref 3.5–5.1)
Sodium: 135 mmol/L (ref 135–145)

## 2018-05-01 ENCOUNTER — Encounter (HOSPITAL_COMMUNITY): Payer: Self-pay

## 2018-05-01 ENCOUNTER — Ambulatory Visit (HOSPITAL_COMMUNITY)
Admission: RE | Admit: 2018-05-01 | Discharge: 2018-05-01 | Disposition: A | Discharge status: Home / Self Care | Payer: Medicare Other | Source: Ambulatory Visit | Attending: Cardiology | Admitting: Cardiology

## 2018-05-01 VITALS — BP 134/98 | HR 81 | Wt 251.8 lb

## 2018-05-01 DIAGNOSIS — I255 Ischemic cardiomyopathy: Secondary | ICD-10-CM | POA: Insufficient documentation

## 2018-05-01 DIAGNOSIS — Z7984 Long term (current) use of oral hypoglycemic drugs: Secondary | ICD-10-CM | POA: Insufficient documentation

## 2018-05-01 DIAGNOSIS — E1122 Type 2 diabetes mellitus with diabetic chronic kidney disease: Secondary | ICD-10-CM | POA: Diagnosis not present

## 2018-05-01 DIAGNOSIS — I5042 Chronic combined systolic (congestive) and diastolic (congestive) heart failure: Secondary | ICD-10-CM | POA: Diagnosis not present

## 2018-05-01 DIAGNOSIS — Z79899 Other long term (current) drug therapy: Secondary | ICD-10-CM | POA: Diagnosis not present

## 2018-05-01 DIAGNOSIS — I2510 Atherosclerotic heart disease of native coronary artery without angina pectoris: Secondary | ICD-10-CM | POA: Insufficient documentation

## 2018-05-01 DIAGNOSIS — Z955 Presence of coronary angioplasty implant and graft: Secondary | ICD-10-CM | POA: Insufficient documentation

## 2018-05-01 DIAGNOSIS — Z7982 Long term (current) use of aspirin: Secondary | ICD-10-CM | POA: Insufficient documentation

## 2018-05-01 DIAGNOSIS — N179 Acute kidney failure, unspecified: Secondary | ICD-10-CM | POA: Diagnosis not present

## 2018-05-01 DIAGNOSIS — E875 Hyperkalemia: Secondary | ICD-10-CM | POA: Diagnosis not present

## 2018-05-01 DIAGNOSIS — I482 Chronic atrial fibrillation, unspecified: Secondary | ICD-10-CM

## 2018-05-01 DIAGNOSIS — Z7902 Long term (current) use of antithrombotics/antiplatelets: Secondary | ICD-10-CM | POA: Insufficient documentation

## 2018-05-01 DIAGNOSIS — N183 Chronic kidney disease, stage 3 unspecified: Secondary | ICD-10-CM

## 2018-05-01 DIAGNOSIS — E785 Hyperlipidemia, unspecified: Secondary | ICD-10-CM | POA: Insufficient documentation

## 2018-05-01 DIAGNOSIS — J45909 Unspecified asthma, uncomplicated: Secondary | ICD-10-CM | POA: Insufficient documentation

## 2018-05-01 DIAGNOSIS — I4891 Unspecified atrial fibrillation: Secondary | ICD-10-CM | POA: Insufficient documentation

## 2018-05-01 DIAGNOSIS — I10 Essential (primary) hypertension: Secondary | ICD-10-CM

## 2018-05-01 DIAGNOSIS — I130 Hypertensive heart and chronic kidney disease with heart failure and stage 1 through stage 4 chronic kidney disease, or unspecified chronic kidney disease: Secondary | ICD-10-CM | POA: Insufficient documentation

## 2018-05-01 DIAGNOSIS — E669 Obesity, unspecified: Secondary | ICD-10-CM | POA: Diagnosis not present

## 2018-05-01 DIAGNOSIS — Z7901 Long term (current) use of anticoagulants: Secondary | ICD-10-CM | POA: Diagnosis not present

## 2018-05-01 DIAGNOSIS — I5022 Chronic systolic (congestive) heart failure: Secondary | ICD-10-CM

## 2018-05-01 LAB — COMPREHENSIVE METABOLIC PANEL
ALT: 26 U/L (ref 0–44)
AST: 21 U/L (ref 15–41)
Albumin: 3.5 g/dL (ref 3.5–5.0)
Alkaline Phosphatase: 70 U/L (ref 38–126)
Anion gap: 19 — ABNORMAL HIGH (ref 5–15)
BUN: 27 mg/dL — AB (ref 8–23)
CO2: 23 mmol/L (ref 22–32)
Calcium: 9 mg/dL (ref 8.9–10.3)
Chloride: 94 mmol/L — ABNORMAL LOW (ref 98–111)
Creatinine, Ser: 1.35 mg/dL — ABNORMAL HIGH (ref 0.61–1.24)
GFR calc Af Amer: 59 mL/min — ABNORMAL LOW (ref >60)
GFR calc non Af Amer: 51 mL/min — ABNORMAL LOW (ref >60)
Glucose, Bld: 377 mg/dL — ABNORMAL HIGH (ref 70–99)
Potassium: 4.2 mmol/L (ref 3.5–5.1)
Sodium: 136 mmol/L (ref 135–145)
Total Bilirubin: 0.8 mg/dL (ref 0.3–1.2)
Total Protein: 6.8 g/dL (ref 6.5–8.1)

## 2018-05-01 MED ORDER — CARVEDILOL 12.5 MG PO TABS: 12.5000 mg | ORAL_TABLET | Freq: Two times a day (BID) | ORAL | 6 refills | Status: DC

## 2018-05-01 NOTE — Progress Notes (Signed)
Advanced Heart Failure Clinic Note   PCP: Bradd Canary, MD PCP-Cardiologist: [name], MD  HF: Dr Shirlee Latch  HPI: David Huynh is a 77 y.o. male with a history of CAD, chronic combined HF, HTN, HLD, DM, CKD 3, and asthma. Patient had EF 35-40% in 9/17, had cath with DES to RCA then repeat echo with EF up to 55-60% by 3/18.   Admitted 03/2018 with SOB x 3 weeks. No cardiology follow up in >1 year. Found to be in Afib RVR. EF down to 35-40% (previous echo 55-50%). Poor diuresis with IV lasix, so HF team consulted. He was started on amio drip. PICC line placed with CVP markedly elevated and Coox marginal. Held off on inotropes with concern for making afib worse. Started on lasix drip. He started eliquis and had TEE/DCCV 11/15. Was converted to NSR, but shortly after went back into AF. Ranexa added. Attempted repeat DCCV 11/19, but was unsuccessful. EP consulted and recommended repeat LHC prior to repeat DCCV (postponed originally due to need for Eliquis with DCCV). Had [facility] 11/20 with PCI to prox left circ. Planned to continue amio load with DCCV in 2 weeks after DC. Diuresed 30+ lbs and transitioned to torsemide 60 mg daily. DC weight 253 lbs.   He returns today for HF follow up. Last visit digoxin stopped with elevated level. Creatinine was up to 2.4 and K 5.5, so spiro was stopped and torsemide was decreased to 40 mg daily. Recheck bloodwork much improved. S/p TEE/DCCV with Dr Shirlee Latch on 12/6. TEE showed EF 40%. Overall doing much better. He feels a lot more alert in NSR. He denies SOB with walking, only mild with stairs. Denies edema, orthopnea, or PND. No CP or dizziness. Taking all medications. Limits salt, but had some nachos recently. Limits fluid intake. Weights stable at home ~247 lbs. SBP 120s on home checks. HR 70-80s at home  [facility]: No tobacco or drug use. Rare ETOH use. Lives at home with his wife in [place]. 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  FH: No family hx of CAD or CHF.   Review of  systems complete and found to be negative unless listed in HPI.   Past Medical History:  Diagnosis Date  . Allergy    seasonal  . Asthma    mild, intermittent  . CAD, multiple vessel    Three-vessel disease involving mid RCA 85% (PCI with DES), OM 2 80% in branch and ostD1 ~90%. Only the mid RCA with PCI target.   . Chronic combined systolic and diastolic CHF (congestive heart failure) (HCC)   . CKD (chronic kidney disease), stage III (HCC)   . Diabetes mellitus    type 2  . Fracture of multiple ribs 02/12/2015  . Hyperlipidemia   . Hypertension   . Hypocalcemia 02/12/2015  . Liver function study, abnormal 04/26/2011  . Multiple rib fractures   . Obesity   . Persistent atrial fibrillation     Current Outpatient Medications  Medication Sig Dispense Refill  . amiodarone (PACERONE) 400 MG tablet Take 200 mg by mouth daily.    Marland Kitchen apixaban (ELIQUIS) 5 MG TABS tablet Take 1 tablet (5 mg total) by mouth 2 (two) times daily. 60 tablet 5  . aspirin 81 MG chewable tablet Chew 1 tablet (81 mg total) by mouth daily. Take for 30 days after stent, then can stop and continue plavix and Eliquis 30 tablet 0  . atorvastatin (LIPITOR) 80 MG tablet Take 1 tablet (80 mg total) by mouth  daily. 90 tablet 3  . carvedilol (COREG) 6.25 MG tablet Take 1 tablet (6.25 mg total) by mouth 2 (two) times daily with a meal. 60 tablet 5  . cholecalciferol (VITAMIN D) 1000 units tablet Take 1,000 Units by mouth daily.    . clopidogrel (PLAVIX) 75 MG tablet Take 1 tablet (75 mg total) by mouth daily with breakfast. 90 tablet 3  . fexofenadine (ALLEGRA) 30 MG tablet Take 1 tablet (30 mg total) by mouth daily. 30 tablet 5  . Fluticasone-Salmeterol (ADVAIR DISKUS) 100-50 MCG/DOSE AEPB Inhale 1 puff into the lungs 2 (two) times daily. 1 each 0  . glimepiride (AMARYL) 1 MG tablet Take 1 tablet (1 mg total) by mouth 2 (two) times daily. (Patient taking differently: Take 1 mg by mouth daily with breakfast. ) 60 tablet 3  .  metFORMIN (GLUCOPHAGE) 1000 MG tablet Take 1 tablet (1,000 mg total) by mouth 2 (two) times daily with a meal. 60 tablet 0  . ranolazine (RANEXA) 500 MG 12 hr tablet Take 1 tablet (500 mg total) by mouth 2 (two) times daily. 60 tablet 5  . torsemide (DEMADEX) 20 MG tablet Take 2 tablets (40 mg total) by mouth daily. 90 tablet 5  . acetaminophen (TYLENOL) 500 MG tablet Take 500 mg by mouth every 8 (eight) hours as needed (for pain or headaches).     . nitroGLYCERIN (NITROSTAT) 0.4 MG SL tablet Place 1 tablet (0.4 mg total) under the tongue every 5 (five) minutes as needed for chest pain. (Patient not taking: Reported on 05/01/2018) 25 tablet 2   No current facility-administered medications for this encounter.     No Known Allergies    Social History   Socioeconomic History  . Marital status: Married    Spouse name: Not on file  . Number of children: Not on file  . Years of education: Not on file  . Highest education level: Not on file  Occupational History  . Not on file  Social Needs  . Financial resource strain: Not on file  . Food insecurity:    Worry: Not on file    Inability: Not on file  . Transportation needs:    Medical: Not on file    Non-medical: Not on file  Tobacco Use  . Smoking status: Never Smoker  . Smokeless tobacco: Never Used  Substance and Sexual Activity  . Alcohol use: Yes    Comment: rare use now  . Drug use: No  . Sexual activity: Not on file  Lifestyle  . Physical activity:    Days per week: Not on file    Minutes per session: Not on file  . Stress: Not on file  Relationships  . Social connections:    Talks on phone: Not on file    Gets together: Not on file    Attends religious service: Not on file    Active member of club or organization: Not on file    Attends meetings of clubs or organizations: Not on file    Relationship status: Not on file  . Intimate partner violence:    Fear of current or ex partner: Not on file    Emotionally  abused: Not on file    Physically abused: Not on file    Forced sexual activity: Not on file  Other Topics Concern  . Not on file  Social History Narrative  . Not on file      Family History  Problem Relation Age of Onset  .  CAD Neg Hx     Vitals:   05/01/18 1442  BP: (!) 134/98  Pulse: 81  SpO2: 97%  Weight: 114.2 kg (251 lb 12.8 oz)   Wt Readings from Last 3 Encounters:  05/01/18 114.2 kg (251 lb 12.8 oz)  04/10/18 113.8 kg (250 lb 12.8 oz)  04/04/18 115 kg (253 lb 8.5 oz)     PHYSICAL EXAM: General: Well appearing. No resp difficulty. HEENT: Normal Neck: Supple. JVP 5-6. Carotids 2+ bilat; no bruits. No thyromegaly or nodule noted. Cor: PMI nondisplaced. IRR, No M/G/R noted Lungs: CTAB, normal effort. Abdomen: Soft, non-tender, non-distended, no HSM. No bruits or masses. +BS  Extremities: No cyanosis, clubbing, or rash. R and LLE no edema.  Neuro: Alert & orientedx3, cranial nerves grossly intact. moves all 4 extremities w/o difficulty. Affect pleasant  ECG: NSR 81 bpm. Personally reviewed  ASSESSMENT & PLAN:  1. Chronic systolic CHF: Prior ischemic cardiomyopathy, improved after PCI. Echo 03/2018 with EF back down to 35-40% and moderate-severe RV dysfunction. It is possible that this is a tachycardia-mediated CMP vs new ischemia (also has significant RV dysfunction). EF 40% on TEE 04/2018 - NYHA II - Volume status stable.  - Continue torsemide 40 mg daily. Recheck CMET today. - Off spiro with hyperkalemia to 5.5. Recheck today. Hopefully can add back soon - No ACEi/ARB/Entresto yet with elevated creatinine.  -IncreaseCoreg to 12.5 mg bid. - Discussed importance of daily weights and limiting fluid and salt intake. Discussed slowly working up activity level.  2. Atrial fibrillation: As above, concerned for possible tachy-mediated CMP. Had successful DC-CV on 11/15 but then back in atrial fibrillation.Ranolazine added, but failed DCCV again 11/19. DCCV  12/6/1/9. EKG today shows NSR - Currently on ranolazine 500 mg BID - Continue amio 200 mg daily. LFTs mildly up last check. TFTs normal 03/2018. Recheck LFTs today.  - Continue Eliquis 5 mg BID. Denies bleeding. 3. CKD: With recent AKI. Baseline seems to be 1.7-1.9  - Creatinine 1.81, K 4.6 (hemolyzed) on 12/9 4. CAD: History of DES to RCA in 9/17. Underwent LHC 11/20 with PCI to prox left circ.  - On ASA + plavix + Eliquis.Can drop ASA after 30 days (05/03/18). Discussed today.  - Continue statin.  - No s/s ischemia.  6. DM2: Ideally would get off glimepiride with CAD and switch to metformin +/- SGLT2 once renal function more stable. Follow up with PCP.  Increase coreg to 12.5 mg BID. Check CMET. Follow up 4-6 weeks.   Alford Highland, NP 05/01/18   Greater than 50% of the 25 minute visit was spent in counseling/coordination of care regarding disease state education, salt/fluid restriction, sliding scale diuretics, and medication compliance.

## 2018-05-01 NOTE — Patient Instructions (Signed)
INCREASE Coreg to 12.5mg  (1 tab) twice a day.  Labs today We will only contact you if something comes back abnormal or we need to make some changes. Otherwise no news is good news!  Your physician recommends that you schedule a follow-up appointment in: 4 weeks with NP/PA clinic  Do the following things EVERYDAY: 1) Weigh yourself in the morning before breakfast. Write it down and keep it in a log. 2) Take your medicines as prescribed 3) Eat low salt foods-Limit salt (sodium) to 2000 mg per day.  4) Stay as active as you can everyday 5) Limit all fluids for the day to less than 2 liters

## 2018-05-29 ENCOUNTER — Ambulatory Visit (HOSPITAL_COMMUNITY)
Admission: RE | Admit: 2018-05-29 | Discharge: 2018-05-29 | Disposition: A | Discharge status: Home / Self Care | Payer: Medicare Other | Source: Ambulatory Visit | Attending: Cardiology | Admitting: Cardiology

## 2018-05-29 ENCOUNTER — Encounter (HOSPITAL_COMMUNITY): Payer: Self-pay

## 2018-05-29 ENCOUNTER — Other Ambulatory Visit: Payer: Self-pay

## 2018-05-29 ENCOUNTER — Encounter (HOSPITAL_COMMUNITY): Payer: Medicare Other

## 2018-05-29 VITALS — BP 142/104 | HR 78 | Wt 262.0 lb

## 2018-05-29 DIAGNOSIS — N183 Chronic kidney disease, stage 3 unspecified: Secondary | ICD-10-CM

## 2018-05-29 DIAGNOSIS — I509 Heart failure, unspecified: Secondary | ICD-10-CM | POA: Diagnosis present

## 2018-05-29 DIAGNOSIS — Z955 Presence of coronary angioplasty implant and graft: Secondary | ICD-10-CM | POA: Diagnosis not present

## 2018-05-29 DIAGNOSIS — M25569 Pain in unspecified knee: Secondary | ICD-10-CM | POA: Diagnosis not present

## 2018-05-29 DIAGNOSIS — E1122 Type 2 diabetes mellitus with diabetic chronic kidney disease: Secondary | ICD-10-CM | POA: Insufficient documentation

## 2018-05-29 DIAGNOSIS — Z7901 Long term (current) use of anticoagulants: Secondary | ICD-10-CM | POA: Insufficient documentation

## 2018-05-29 DIAGNOSIS — E785 Hyperlipidemia, unspecified: Secondary | ICD-10-CM | POA: Insufficient documentation

## 2018-05-29 DIAGNOSIS — Z7984 Long term (current) use of oral hypoglycemic drugs: Secondary | ICD-10-CM | POA: Insufficient documentation

## 2018-05-29 DIAGNOSIS — I255 Ischemic cardiomyopathy: Secondary | ICD-10-CM | POA: Insufficient documentation

## 2018-05-29 DIAGNOSIS — Z7902 Long term (current) use of antithrombotics/antiplatelets: Secondary | ICD-10-CM | POA: Diagnosis not present

## 2018-05-29 DIAGNOSIS — N179 Acute kidney failure, unspecified: Secondary | ICD-10-CM | POA: Diagnosis not present

## 2018-05-29 DIAGNOSIS — I5042 Chronic combined systolic (congestive) and diastolic (congestive) heart failure: Secondary | ICD-10-CM | POA: Diagnosis not present

## 2018-05-29 DIAGNOSIS — I4891 Unspecified atrial fibrillation: Secondary | ICD-10-CM | POA: Diagnosis not present

## 2018-05-29 DIAGNOSIS — I2510 Atherosclerotic heart disease of native coronary artery without angina pectoris: Secondary | ICD-10-CM | POA: Diagnosis not present

## 2018-05-29 DIAGNOSIS — I480 Paroxysmal atrial fibrillation: Secondary | ICD-10-CM | POA: Diagnosis not present

## 2018-05-29 DIAGNOSIS — I10 Essential (primary) hypertension: Secondary | ICD-10-CM

## 2018-05-29 DIAGNOSIS — J45909 Unspecified asthma, uncomplicated: Secondary | ICD-10-CM | POA: Insufficient documentation

## 2018-05-29 DIAGNOSIS — Z79899 Other long term (current) drug therapy: Secondary | ICD-10-CM | POA: Diagnosis not present

## 2018-05-29 DIAGNOSIS — Z7982 Long term (current) use of aspirin: Secondary | ICD-10-CM | POA: Diagnosis not present

## 2018-05-29 DIAGNOSIS — Z452 Encounter for adjustment and management of vascular access device: Secondary | ICD-10-CM | POA: Diagnosis not present

## 2018-05-29 DIAGNOSIS — I5022 Chronic systolic (congestive) heart failure: Secondary | ICD-10-CM | POA: Diagnosis not present

## 2018-05-29 DIAGNOSIS — I130 Hypertensive heart and chronic kidney disease with heart failure and stage 1 through stage 4 chronic kidney disease, or unspecified chronic kidney disease: Secondary | ICD-10-CM | POA: Insufficient documentation

## 2018-05-29 MED ORDER — CARVEDILOL 12.5 MG PO TABS: 18.7500 mg | ORAL_TABLET | Freq: Two times a day (BID) | ORAL | 6 refills | Status: DC

## 2018-05-29 NOTE — Patient Instructions (Addendum)
INCREASE Coreg to 18.75mg  (1.5 tab) twice a day  Your physician has requested that you have an echocardiogram. Echocardiography is a painless test that uses sound waves to create images of your heart. It provides your doctor with information about the size and shape of your heart and how well your heart's chambers and valves are working. This procedure takes approximately one hour. There are no restrictions for this procedure.  Your physician recommends that you schedule a follow-up appointment in: 2 months with Dr. Shirlee Latch and an ECHO

## 2018-05-29 NOTE — Progress Notes (Signed)
Advanced Heart Failure Clinic Note   PCP: Bradd Canary, MD PCP-Cardiologist: [name], MD  HF: Dr Shirlee Latch  HPI: David Huynh is a 78 y.o. male with a history of CAD, chronic combined HF, HTN, HLD, DM, CKD 3, and asthma. Patient had EF 35-40% in 9/17, had cath with DES to RCA then repeat echo with EF up to 55-60% by 3/18.   Admitted 03/2018 with SOB x 3 weeks. No cardiology follow up in >1 year. Found to be in Afib RVR. EF down to 35-40% (previous echo 55-50%). Poor diuresis with IV lasix, so HF team consulted. He was started on amio drip. PICC line placed with CVP markedly elevated and Coox marginal. Held off on inotropes with concern for making afib worse. Started on lasix drip. He started eliquis and had TEE/DCCV 11/15. Was converted to NSR, but shortly after went back into AF. Ranexa added. Attempted repeat DCCV 11/19, but was unsuccessful. EP consulted and recommended repeat LHC prior to repeat DCCV (postponed originally due to need for Eliquis with DCCV). Had [facility] 11/20 with PCI to prox left circ. Planned to continue amio load with DCCV in 2 weeks after DC. Diuresed 30+ lbs and transitioned to torsemide 60 mg daily. DC weight 253 lbs.    S/p TEE/DCCV with Dr Shirlee Latch on 12/6. TEE showed EF 40%.   Today he returns for HF follow up. Last visit carvedilol was increased to 12.5 mg twice a day.  Overall feeling fine. Denies SOB/PND/Orthopnea. SBP at home has been in the 120s. No chest pain. No bleeding problems. Not exercising due to knee pain.  Appetite ok.Following low salt diet.  No fever or chills. No bleeding problems. Weight at home 253-255 pounds. Taking all medications.  SH: No tobacco or drug use. Rare ETOH use. Lives at home with his wife in [place]. 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  FH: No family hx of CAD or CHF.   Review of systems complete and found to be negative unless listed in HPI.   Past Medical History:  Diagnosis Date  . Allergy    seasonal  . Asthma    mild,  intermittent  . CAD, multiple vessel    Three-vessel disease involving mid RCA 85% (PCI with DES), OM 2 80% in branch and ostD1 ~90%. Only the mid RCA with PCI target.   . Chronic combined systolic and diastolic CHF (congestive heart failure) (HCC)   . CKD (chronic kidney disease), stage III (HCC)   . Diabetes mellitus    type 2  . Fracture of multiple ribs 02/12/2015  . Hyperlipidemia   . Hypertension   . Hypocalcemia 02/12/2015  . Liver function study, abnormal 04/26/2011  . Multiple rib fractures   . Obesity   . Persistent atrial fibrillation     Current Outpatient Medications  Medication Sig Dispense Refill  . acetaminophen (TYLENOL) 500 MG tablet Take 500 mg by mouth every 8 (eight) hours as needed (for pain or headaches).     Marland Kitchen amiodarone (PACERONE) 400 MG tablet Take 200 mg by mouth daily.    Marland Kitchen apixaban (ELIQUIS) 5 MG TABS tablet Take 1 tablet (5 mg total) by mouth 2 (two) times daily. 60 tablet 5  . aspirin 81 MG chewable tablet Chew 1 tablet (81 mg total) by mouth daily. Take for 30 days after stent, then can stop and continue plavix and Eliquis 30 tablet 0  . atorvastatin (LIPITOR) 80 MG tablet Take 1 tablet (80 mg total) by mouth  daily. 90 tablet 3  . carvedilol (COREG) 12.5 MG tablet Take 1 tablet (12.5 mg total) by mouth 2 (two) times daily with a meal. 30 tablet 6  . cholecalciferol (VITAMIN D) 1000 units tablet Take 1,000 Units by mouth daily.    . clopidogrel (PLAVIX) 75 MG tablet Take 1 tablet (75 mg total) by mouth daily with breakfast. 90 tablet 3  . fexofenadine (ALLEGRA) 30 MG tablet Take 1 tablet (30 mg total) by mouth daily. 30 tablet 5  . Fluticasone-Salmeterol (ADVAIR DISKUS) 100-50 MCG/DOSE AEPB Inhale 1 puff into the lungs 2 (two) times daily. 1 each 0  . glimepiride (AMARYL) 1 MG tablet Take 1 tablet (1 mg total) by mouth 2 (two) times daily. (Patient taking differently: Take 1 mg by mouth daily with breakfast. ) 60 tablet 3  . metFORMIN (GLUCOPHAGE) 1000 MG  tablet Take 1 tablet (1,000 mg total) by mouth 2 (two) times daily with a meal. 60 tablet 0  . ranolazine (RANEXA) 500 MG 12 hr tablet Take 1 tablet (500 mg total) by mouth 2 (two) times daily. 60 tablet 5  . torsemide (DEMADEX) 20 MG tablet Take 2 tablets (40 mg total) by mouth daily. 90 tablet 5  . nitroGLYCERIN (NITROSTAT) 0.4 MG SL tablet Place 1 tablet (0.4 mg total) under the tongue every 5 (five) minutes as needed for chest pain. (Patient not taking: Reported on 05/01/2018) 25 tablet 2   No current facility-administered medications for this encounter.     No Known Allergies    Social History   Socioeconomic History  . Marital status: Married    Spouse name: Not on file  . Number of children: Not on file  . Years of education: Not on file  . Highest education level: Not on file  Occupational History  . Not on file  Social Needs  . Financial resource strain: Not on file  . Food insecurity:    Worry: Not on file    Inability: Not on file  . Transportation needs:    Medical: Not on file    Non-medical: Not on file  Tobacco Use  . Smoking status: Never Smoker  . Smokeless tobacco: Never Used  Substance and Sexual Activity  . Alcohol use: Yes    Comment: rare use now  . Drug use: No  . Sexual activity: Not on file  Lifestyle  . Physical activity:    Days per week: Not on file    Minutes per session: Not on file  . Stress: Not on file  Relationships  . Social connections:    Talks on phone: Not on file    Gets together: Not on file    Attends religious service: Not on file    Active member of club or organization: Not on file    Attends meetings of clubs or organizations: Not on file    Relationship status: Not on file  . Intimate partner violence:    Fear of current or ex partner: Not on file    Emotionally abused: Not on file    Physically abused: Not on file    Forced sexual activity: Not on file  Other Topics Concern  . Not on file  Social History  Narrative  . Not on file      Family History  Problem Relation Age of Onset  . CAD Neg Hx     Vitals:   05/29/18 1400  BP: (!) 142/104  Pulse: 78  SpO2: 93%  Weight: 118.8  kg (262 lb)   Wt Readings from Last 3 Encounters:  05/29/18 118.8 kg (262 lb)  05/01/18 114.2 kg (251 lb 12.8 oz)  04/10/18 113.8 kg (250 lb 12.8 oz)     PHYSICAL EXAM: General:  Well appearing. No resp difficulty. Walked in the clinic without difficulty.  HEENT: normal Neck: supple. no JVD. Carotids 2+ bilat; no bruits. No lymphadenopathy or thryomegaly appreciated. Cor: PMI nondisplaced. Regular rate & rhythm. No rubs, gallops or murmurs. Lungs: clear Abdomen: soft, nontender, nondistended. No hepatosplenomegaly. No bruits or masses. Good bowel sounds. Extremities: no cyanosis, clubbing, rash, edema Neuro: alert & orientedx3, cranial nerves grossly intact. moves all 4 extremities w/o difficulty. Affect pleasant   ECG: NSR 74 bpm   ASSESSMENT & PLAN:  1. Chronic systolic CHF: Prior ischemic cardiomyopathy, improved after PCI. Echo 03/2018 with EF back down to 35-40% and moderate-severe RV dysfunction. It is possible that this is a tachycardia-mediated CMP vs new ischemia (also has significant RV dysfunction). EF 40% on TEE 04/2018 - NYHA II. Volume status stable despite weight gain. Continue torsemide 40 mg daily.  - No ACEi/ARB/Entresto/Spiro  elevated creatinine and potassium .  -Increase coreg to 18.75 mg twice a day. . - Plan to repeat ECHO next in a few months.   2. Atrial fibrillation: As above, concerned for possible tachy-mediated CMP. Had successful DC-CV on 11/15 but then back in atrial fibrillation.Ranolazine added, but failed DCCV again 11/19. DCCV 12/6/1/9.  LFTs stable 05/01/18.  -Maintaining SR.  - Currently on ranolazine 500 mg BID - Continue amio 200 mg daily. - Continue Eliquis 5 mg BID. Denies bleeding.  3. CKD: With recent AKI. Baseline seems to be 1.7-1.9  Recent BMET  was stable.   4. CAD: History of DES to RCA in 9/17. Underwent LHC 11/20 with PCI to prox left circ.  - On ASA + plavix + Eliquis.Can drop ASA after 30 days (05/03/18). Discussed today.  - Continue statin.  -No s/s ischemia.   5. DM2: Ideally would get off glimepiride with CAD and switch to metformin +/- SGLT2 once renal function more stable. Follow up with PCP.  6. HTN Elevated BP today. Increase carvedilol 18.75 mg twice a day.  Continue all other labs.   Follow up 2 months with Dr Shirlee Latch and ECHO. Discussed EKG and medication changes.    Tonye Becket, NP 05/29/18

## 2018-07-30 ENCOUNTER — Ambulatory Visit (HOSPITAL_BASED_OUTPATIENT_CLINIC_OR_DEPARTMENT_OTHER)
Admission: RE | Admit: 2018-07-30 | Discharge: 2018-07-30 | Disposition: A | Discharge status: Home / Self Care | Payer: Medicare Other | Source: Ambulatory Visit | Attending: Cardiology | Admitting: Cardiology

## 2018-07-30 ENCOUNTER — Ambulatory Visit (HOSPITAL_COMMUNITY)
Admission: RE | Admit: 2018-07-30 | Discharge: 2018-07-30 | Disposition: A | Discharge status: Home / Self Care | Payer: Medicare Other | Source: Ambulatory Visit | Attending: Family Medicine | Admitting: Family Medicine

## 2018-07-30 ENCOUNTER — Other Ambulatory Visit: Payer: Self-pay

## 2018-07-30 ENCOUNTER — Encounter (HOSPITAL_COMMUNITY): Payer: Self-pay | Admitting: Cardiology

## 2018-07-30 VITALS — BP 132/80 | HR 81 | Wt 278.4 lb

## 2018-07-30 DIAGNOSIS — N183 Chronic kidney disease, stage 3 unspecified: Secondary | ICD-10-CM

## 2018-07-30 DIAGNOSIS — Z7901 Long term (current) use of anticoagulants: Secondary | ICD-10-CM | POA: Diagnosis not present

## 2018-07-30 DIAGNOSIS — Z79899 Other long term (current) drug therapy: Secondary | ICD-10-CM | POA: Insufficient documentation

## 2018-07-30 DIAGNOSIS — I130 Hypertensive heart and chronic kidney disease with heart failure and stage 1 through stage 4 chronic kidney disease, or unspecified chronic kidney disease: Secondary | ICD-10-CM | POA: Diagnosis not present

## 2018-07-30 DIAGNOSIS — E1122 Type 2 diabetes mellitus with diabetic chronic kidney disease: Secondary | ICD-10-CM | POA: Insufficient documentation

## 2018-07-30 DIAGNOSIS — I5022 Chronic systolic (congestive) heart failure: Secondary | ICD-10-CM | POA: Diagnosis not present

## 2018-07-30 DIAGNOSIS — I5042 Chronic combined systolic (congestive) and diastolic (congestive) heart failure: Secondary | ICD-10-CM

## 2018-07-30 DIAGNOSIS — I082 Rheumatic disorders of both aortic and tricuspid valves: Secondary | ICD-10-CM | POA: Diagnosis not present

## 2018-07-30 DIAGNOSIS — Z955 Presence of coronary angioplasty implant and graft: Secondary | ICD-10-CM | POA: Insufficient documentation

## 2018-07-30 DIAGNOSIS — Z7902 Long term (current) use of antithrombotics/antiplatelets: Secondary | ICD-10-CM | POA: Diagnosis not present

## 2018-07-30 DIAGNOSIS — I480 Paroxysmal atrial fibrillation: Secondary | ICD-10-CM

## 2018-07-30 DIAGNOSIS — Z7984 Long term (current) use of oral hypoglycemic drugs: Secondary | ICD-10-CM | POA: Diagnosis not present

## 2018-07-30 DIAGNOSIS — I2510 Atherosclerotic heart disease of native coronary artery without angina pectoris: Secondary | ICD-10-CM

## 2018-07-30 DIAGNOSIS — E785 Hyperlipidemia, unspecified: Secondary | ICD-10-CM | POA: Insufficient documentation

## 2018-07-30 DIAGNOSIS — I255 Ischemic cardiomyopathy: Secondary | ICD-10-CM | POA: Diagnosis not present

## 2018-07-30 DIAGNOSIS — I4891 Unspecified atrial fibrillation: Secondary | ICD-10-CM

## 2018-07-30 LAB — CBC
HEMATOCRIT: 41 % (ref 39.0–52.0)
Hemoglobin: 13.4 g/dL (ref 13.0–17.0)
MCH: 30.9 pg (ref 26.0–34.0)
MCHC: 32.7 g/dL (ref 30.0–36.0)
MCV: 94.7 fL (ref 80.0–100.0)
Platelets: 189 10*3/uL (ref 150–400)
RBC: 4.33 MIL/uL (ref 4.22–5.81)
RDW: 13.7 % (ref 11.5–15.5)
WBC: 7.2 10*3/uL (ref 4.0–10.5)
nRBC: 0 % (ref 0.0–0.2)

## 2018-07-30 LAB — COMPREHENSIVE METABOLIC PANEL
ALT: 25 U/L (ref 0–44)
AST: 21 U/L (ref 15–41)
Albumin: 4 g/dL (ref 3.5–5.0)
Alkaline Phosphatase: 63 U/L (ref 38–126)
Anion gap: 11 (ref 5–15)
BUN: 23 mg/dL (ref 8–23)
CO2: 26 mmol/L (ref 22–32)
Calcium: 9.7 mg/dL (ref 8.9–10.3)
Chloride: 104 mmol/L (ref 98–111)
Creatinine, Ser: 1.29 mg/dL — ABNORMAL HIGH (ref 0.61–1.24)
GFR calc Af Amer: >60 mL/min (ref >60)
GFR calc non Af Amer: 53 mL/min — ABNORMAL LOW (ref >60)
Glucose, Bld: 194 mg/dL — ABNORMAL HIGH (ref 70–99)
Potassium: 3.9 mmol/L (ref 3.5–5.1)
Sodium: 141 mmol/L (ref 135–145)
Total Bilirubin: 0.9 mg/dL (ref 0.3–1.2)
Total Protein: 7.2 g/dL (ref 6.5–8.1)

## 2018-07-30 LAB — TSH: TSH: 2.054 u[IU]/mL (ref 0.350–4.500)

## 2018-07-30 NOTE — Progress Notes (Signed)
  Echocardiogram 2D Echocardiogram has been performed.  David Huynh M 07/30/2018, 1:40 PM

## 2018-07-30 NOTE — Patient Instructions (Signed)
Labs done today  You have been referred to Dr Johney Frame at [facility], they will call you to schedule  Your physician recommends that you schedule a follow-up appointment in: 3 months

## 2018-07-31 NOTE — Progress Notes (Signed)
Advanced Heart Failure Clinic Note   PCP: Bradd Canary, MD  Cardiologist: [name], MD  HPI: David Huynh is a 78 y.o. male with a history of CAD, chronic systolic CHF, HTN, HLD, DM, CKD 3, and asthma. Patient had EF 35-40% in 9/17, had cath with DES to RCA then repeat echo with EF up to 55-60% by 3/18.   Admitted 03/2018 with SOB x 3 weeks. No cardiology follow up in >1 year. Found to be in atrial fibrillation with RVR. EF down to 35-40%. Poor diuresis with IV lasix, so HF team consulted. He was started on amiodarone drip. PICC line placed with CVP markedly elevated and Coox marginal. Held off on inotropes with concern for making afib worse. Started on lasix drip. He started eliquis and had TEE/DCCV 03/29/18. Was converted to NSR, but shortly after went back into AF. Ranexa added. Attempted repeat DCCV 04/02/18, but was unsuccessful. EP consulted and recommended repeat LHC prior to repeat DCCV. Had [facility] 04/03/18 with PCI to proximal LCx.  He then had TEE-DCCV on 04/19/18 back to NSR. EF on TEE was 40%.    Echo was done today and reviewed, EF 50% with diffuse hypokinesis, normal RV, mild AS and mild MR.    He returns for followup of CHF, CAD, and atrial fibrillation.  He is in NSR today. Weight is up, but he thinks this is caloric due to increased appetite.  No significant exertional dyspnea.  No chest pain.  No BRBPR/melena.  No lightheadedness or palpitations.   ECG (personally reviewed): NSR, QTc 462, poor RWP.   Labs (12/19): K 4.2, creatinine 1.35, LFTs normal  SH: No tobacco or drug use. Rare ETOH use. Lives at home with his wife in [place]. 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  FH: No family hx of CAD or CHF.   Review of systems complete and found to be negative unless listed in HPI.   PMH: 1. CKD: Stage 3.  2. Atrial fibrillation: Paroxysmal, poorly tolerated with history of tachycardia-mediated CMP.   - DCCV x 2 in 11/19 and once in 12/19.  3. Type II diabetes.  4. HTN  5. Hyperlipidemia.  6. CAD: DES to mid RCA in 2017.   - LHC (11/19): 50% mLAD, 50% dLAD, 80-85% proximal LCx stenosis, OM1 small vessel with 90% stenois. PCI to proximal LCx.  7. Chronic systolic CHF: Suspect this was primarily tachy-cardia mediated.  - Echo (11/19): EF 35-40%, moderate to severely decreased RV systolic function.  - TEE (12/19): EF 40%, diffuse hypokinesis, normal RV size and systolic function.  - Echo (3/20): EF 50%, normal RV, mild MR, mild AS with mean gradient 14 mmHg. 8. Aortic stenosis: Mild by 3/20 echo.    Current Outpatient Medications  Medication Sig Dispense Refill  . acetaminophen (TYLENOL) 500 MG tablet Take 500 mg by mouth every 8 (eight) hours as needed (for pain or headaches).     Marland Kitchen amiodarone (PACERONE) 400 MG tablet Take 200 mg by mouth daily.    Marland Kitchen apixaban (ELIQUIS) 5 MG TABS tablet Take 1 tablet (5 mg total) by mouth 2 (two) times daily. 60 tablet 5  . atorvastatin (LIPITOR) 80 MG tablet Take 1 tablet (80 mg total) by mouth daily. 90 tablet 3  . carvedilol (COREG) 12.5 MG tablet Take 1.5 tablets (18.75 mg total) by mouth 2 (two) times daily with a meal. 90 tablet 6  . cholecalciferol (VITAMIN D) 1000 units tablet Take 1,000 Units by mouth daily.    Marland Kitchen  clopidogrel (PLAVIX) 75 MG tablet Take 1 tablet (75 mg total) by mouth daily with breakfast. 90 tablet 3  . fexofenadine (ALLEGRA) 30 MG tablet Take 1 tablet (30 mg total) by mouth daily. 30 tablet 5  . Fluticasone-Salmeterol (ADVAIR DISKUS) 100-50 MCG/DOSE AEPB Inhale 1 puff into the lungs 2 (two) times daily. 1 each 0  . glimepiride (AMARYL) 1 MG tablet Take 1 tablet (1 mg total) by mouth 2 (two) times daily. (Patient taking differently: Take 1 mg by mouth daily with breakfast. ) 60 tablet 3  . metFORMIN (GLUCOPHAGE) 1000 MG tablet Take 1 tablet (1,000 mg total) by mouth 2 (two) times daily with a meal. 60 tablet 0  . nitroGLYCERIN (NITROSTAT) 0.4 MG SL tablet Place 1 tablet (0.4 mg total) under the tongue  every 5 (five) minutes as needed for chest pain. 25 tablet 2  . ranolazine (RANEXA) 500 MG 12 hr tablet Take 1 tablet (500 mg total) by mouth 2 (two) times daily. 60 tablet 5  . torsemide (DEMADEX) 20 MG tablet Take 2 tablets (40 mg total) by mouth daily. 90 tablet 5   No current facility-administered medications for this encounter.     No Known Allergies   Vitals:   07/30/18 1405  BP: 132/80  Pulse: 81  SpO2: 94%  Weight: 126.3 kg (278 lb 6 oz)   Wt Readings from Last 3 Encounters:  07/30/18 126.3 kg (278 lb 6 oz)  05/29/18 118.8 kg (262 lb)  05/01/18 114.2 kg (251 lb 12.8 oz)   PHYSICAL EXAM: General: NAD Neck: No JVD, no thyromegaly or thyroid nodule.  Lungs: Clear to auscultation bilaterally with normal respiratory effort. CV: Nondisplaced PMI.  Heart regular S1/S2, no S3/S4, no murmur.  No peripheral edema.  No carotid bruit.  Normal pedal pulses.  Abdomen: Soft, nontender, no hepatosplenomegaly, no distention.  Skin: Intact without lesions or rashes.  Neurologic: Alert and oriented x 3.  Psych: Normal affect. Extremities: No clubbing or cyanosis.  HEENT: Normal.   ASSESSMENT & PLAN:  1. Chronic systolic CHF: Prior ischemic cardiomyopathy, improved after PCI in 2017. Echo 03/2018 with EF back down to 35-40% and moderate-severe RV dysfunction. This was in the setting of atrial fibrillation with RVR. EF 40% on TEE 04/2018.  Echo was done today, showing EF up to 50%.  I suspect that he primarily had a tachycardia-mediated CMP.  He is not volume overloaded on exam, NYHA class II  - Continue torsemide 40 mg daily.  BMET today.  - No ACEi/ARB/Entresto/spironolactone for now with elevated creatinine and improved EF.  -Continue Coreg 18.75 mg bid.  2. Atrial fibrillation: As above, concerned for possible tachy-mediated CMP. Had successful DC-CV on 03/29/18 but then back in atrial fibrillation.Ranolazine added, but failed DCCV again 04/02/18. DCCV 04/19/18 was successful and  he remains in NSR on amiodarone + ranolazine. - Currently on ranolazine 500 mg BID, QTc acceptable on today's ECG.  - Continue amio 200 mg daily.  Check LFTs/TSH today.  He will need a regular eye exam with amiodarone use.  - Continue Eliquis 5 mg BID. CBC today.  - Atria are smaller on echo today and EF is higher than in the past. I think we need to evaluate him for atrial fibrillation ablation.  I will refer him to Dr. Johney Frame for consideration.  3. CKD: Stage 3 . BMET today.  4. CAD: History of DES to RCA in 9/17. Underwent LHC 04/03/18 with PCI to prox left circ. No chest pain.  -  He remains on Plavix + Eliquis.Stop Plavix in 11/9 and continue Eliquis.  - Continue statin.   5. DM2: Ideally would get off glimepiride with CAD and switch to metformin +/- SGLT2 inhibitor. Follow up with PCP.  Followup in 3 months.     [name], MD 07/31/18

## 2018-09-25 ENCOUNTER — Telehealth: Payer: Self-pay

## 2018-10-01 NOTE — Telephone Encounter (Signed)
Spoke with pt regarding appt on 10/04/18. Pt was advise to check vitals prior to appt. Pt concerns were address. 

## 2018-10-04 ENCOUNTER — Encounter: Payer: Self-pay | Admitting: Internal Medicine

## 2018-10-04 ENCOUNTER — Telehealth (INDEPENDENT_AMBULATORY_CARE_PROVIDER_SITE_OTHER): Payer: Medicare Other | Admitting: Internal Medicine

## 2018-10-04 VITALS — BP 122/78 | HR 75 | Ht 72.0 in | Wt 267.0 lb

## 2018-10-04 DIAGNOSIS — I2510 Atherosclerotic heart disease of native coronary artery without angina pectoris: Secondary | ICD-10-CM | POA: Diagnosis not present

## 2018-10-04 DIAGNOSIS — I5022 Chronic systolic (congestive) heart failure: Secondary | ICD-10-CM

## 2018-10-04 DIAGNOSIS — I4819 Other persistent atrial fibrillation: Secondary | ICD-10-CM | POA: Diagnosis not present

## 2018-10-04 NOTE — Progress Notes (Signed)
Electrophysiology TeleHealth Note   Due to national recommendations of social distancing due to COVID 19, an audio/video telehealth visit is felt to be most appropriate for this patient at this time.  See MyChart message from today for the patient's consent to telehealth for [name].   Date:  10/04/2018   ID:  David Huynh, DOB 1940/05/23, MRN [id number]  Location: patient's home  Provider location: [place]  Evaluation Performed: Follow-up visit  PCP:  Bradd Canary, MD  Cardiologist:  [name], MD  Electrophysiologist:  Dr Johney Frame  Chief Complaint:  afib  History of Present Illness:    David Huynh is a 78 y.o. male who presents via audio/video conferencing for a telehealth visit today. I saw him in November during his hospitalization for afib and CHF.  He has done very well since discharge.  He remains in sinus rhythm with amiodarone.  He denies side effects and is pleased with his currently health state. Today, he denies symptoms of palpitations, chest pain, shortness of breath,  lower extremity edema, dizziness, presyncope, or syncope.  The patient is otherwise without complaint today.  The patient denies symptoms of fevers, chills, cough, or new SOB worrisome for COVID 19.  Past Medical History:  Diagnosis Date  . Allergy    seasonal  . Asthma    mild, intermittent  . CAD, multiple vessel    Three-vessel disease involving mid RCA 85% (PCI with DES), OM 2 80% in branch and ostD1 ~90%. Only the mid RCA with PCI target.   . Chronic combined systolic and diastolic CHF (congestive heart failure) (HCC)   . CKD (chronic kidney disease), stage III (HCC)   . Diabetes mellitus    type 2  . Fracture of multiple ribs 02/12/2015  . Hyperlipidemia   . Hypertension   . Hypocalcemia 02/12/2015  . Liver function study, abnormal 04/26/2011  . Multiple rib fractures   . Obesity   . Persistent atrial fibrillation     Past Surgical History:  Procedure  Laterality Date  . APPENDECTOMY  1975  . CARDIAC CATHETERIZATION N/A 02/17/2016   Procedure: Left Heart Cath and Coronary Angiography;  Surgeon: Marykay Lex, MD;  Location: [facility] INVASIVE CV LAB;  Service: Cardiovascular: mRCA 85% (PCI), branch of OM2 80%,  ostD1 90%; EF 35-45%  . CARDIAC CATHETERIZATION N/A 02/17/2016   Procedure: Coronary Stent Intervention;  Surgeon: Marykay Lex, MD;  Location: [facility] INVASIVE CV LAB;  Service: Cardiovascular: mRCA PCI : STENT PROMUS PREM MR 3.5X28 DES  . CARDIOVERSION N/A 03/29/2018   Procedure: CARDIOVERSION;  Surgeon: Laurey Morale, MD;  Location: [facility] ENDOSCOPY;  Service: Cardiovascular;  Laterality: N/A;  . CARDIOVERSION N/A 04/02/2018   Procedure: CARDIOVERSION;  Surgeon: Laurey Morale, MD;  Location: [facility] ENDOSCOPY;  Service: Cardiovascular;  Laterality: N/A;  . CARDIOVERSION N/A 04/19/2018   Procedure: CARDIOVERSION;  Surgeon: Laurey Morale, MD;  Location: [facility] ENDOSCOPY;  Service: Cardiovascular;  Laterality: N/A;  . CATARACT EXTRACTION  2010   b/l  . CORONARY STENT INTERVENTION N/A 04/03/2018   Procedure: CORONARY STENT INTERVENTION;  Surgeon: Lennette Bihari, MD;  Location: [facility] INVASIVE CV LAB;  Service: Cardiovascular;  Laterality: N/A;  . RETINAL LASER PROCEDURE  1985  . RIGHT/LEFT HEART CATH AND CORONARY ANGIOGRAPHY N/A 04/03/2018   Procedure: RIGHT/LEFT HEART CATH AND CORONARY ANGIOGRAPHY;  Surgeon: Laurey Morale, MD;  Location: [facility] INVASIVE CV LAB;  Service: Cardiovascular;  Laterality: N/A;  . TEE WITHOUT CARDIOVERSION  N/A 03/29/2018   Procedure: TRANSESOPHAGEAL ECHOCARDIOGRAM (TEE);  Surgeon: Laurey Morale, MD;  Location: [facility] ENDOSCOPY;  Service: Cardiovascular;  Laterality: N/A;  . TEE WITHOUT CARDIOVERSION N/A 04/19/2018   Procedure: TRANSESOPHAGEAL ECHOCARDIOGRAM (TEE);  Surgeon: Laurey Morale, MD;  Location: [facility] ENDOSCOPY;  Service: Cardiovascular;  Laterality: N/A;  . TONSILLECTOMY AND ADENOIDECTOMY  1947  . TRANSTHORACIC  ECHOCARDIOGRAM  02/2016   Mild LVH. Moderately reduced EF of 35-40% with diffuse hypokinesis. Indeterminate diastolic function. Likely elevated LA pressures. Mild aortic stenosis (mean gradient 10 mmHg). Mild MR. Mild LA dilation. Mild to moderate PA hypertension with 39 mmHg  . VIDEO ASSISTED THORACOSCOPY (VATS)/THOROCOTOMY Right 01/24/2015   Procedure: VIDEO ASSISTED THORACOSCOPY (VATS) FOR DRAINAGE OF RIGHT HEMOTHORAX;  Surgeon: Alleen Borne, MD;  Location: [facility] OR;  Service: Thoracic;  Laterality: Right;    Current Outpatient Medications  Medication Sig Dispense Refill  . acetaminophen (TYLENOL) 500 MG tablet Take 500 mg by mouth every 8 (eight) hours as needed (for pain or headaches).     Marland Kitchen amiodarone (PACERONE) 400 MG tablet Take 200 mg by mouth daily.    Marland Kitchen apixaban (ELIQUIS) 5 MG TABS tablet Take 1 tablet (5 mg total) by mouth 2 (two) times daily. 60 tablet 5  . atorvastatin (LIPITOR) 80 MG tablet Take 1 tablet (80 mg total) by mouth daily. 90 tablet 3  . carvedilol (COREG) 12.5 MG tablet Take 1.5 tablets (18.75 mg total) by mouth 2 (two) times daily with a meal. 90 tablet 6  . cholecalciferol (VITAMIN D) 1000 units tablet Take 1,000 Units by mouth daily.    . clopidogrel (PLAVIX) 75 MG tablet Take 1 tablet (75 mg total) by mouth daily with breakfast. 90 tablet 3  . fexofenadine (ALLEGRA) 30 MG tablet Take 1 tablet (30 mg total) by mouth daily. 30 tablet 5  . Fluticasone-Salmeterol (ADVAIR DISKUS) 100-50 MCG/DOSE AEPB Inhale 1 puff into the lungs 2 (two) times daily. (Patient taking differently: Inhale 1 puff into the lungs as needed (shortness of breath). ) 1 each 0  . glimepiride (AMARYL) 1 MG tablet Take 1 tablet (1 mg total) by mouth 2 (two) times daily. (Patient taking differently: Take 1 mg by mouth daily with breakfast. ) 60 tablet 3  . metFORMIN (GLUCOPHAGE) 1000 MG tablet Take 1 tablet (1,000 mg total) by mouth 2 (two) times daily with a meal. 60 tablet 0  . nitroGLYCERIN  (NITROSTAT) 0.4 MG SL tablet Place 1 tablet (0.4 mg total) under the tongue every 5 (five) minutes as needed for chest pain. 25 tablet 2  . ranolazine (RANEXA) 500 MG 12 hr tablet Take 1 tablet (500 mg total) by mouth 2 (two) times daily. 60 tablet 5  . torsemide (DEMADEX) 20 MG tablet Take 2 tablets (40 mg total) by mouth daily. 90 tablet 5   No current facility-administered medications for this visit.     Allergies:   Patient has no known allergies.   Social History:  The patient  reports that he has never smoked. He has never used smokeless tobacco. He reports current alcohol use. He reports that he does not use drugs.   Family History:  + HTN  ROS:  Please see the history of present illness.   All other systems are personally reviewed and negative.    Exam:    Vital Signs:  BP 122/78   Pulse 75   Ht 6' (1.829 m)   Wt 267 lb (121.1 kg)   BMI 36.21 kg/m  Well appearing, alert and conversant, regular work of breathing,  good skin color Eyes- anicteric, neuro- grossly intact, skin- no apparent rash or lesions or cyanosis, mouth- oral mucosa is pink   Labs/Other Tests and Data Reviewed:    Recent Labs: 03/25/2018: B Natriuretic Peptide 381.9 04/04/2018: Magnesium 2.5 07/30/2018: ALT 25; BUN 23; Creatinine, Ser 1.29; Hemoglobin 13.4; Platelets 189; Potassium 3.9; Sodium 141; TSH 2.054   Wt Readings from Last 3 Encounters:  10/04/18 267 lb (121.1 kg)  07/30/18 278 lb 6 oz (126.3 kg)  05/29/18 262 lb (118.8 kg)     Other studies personally reviewed: Additional studies/ records that were reviewed today include: my prior note, Dr Kathlyn [place] notes, prior echos  Review of the above records today demonstrates: as above    ASSESSMENT & PLAN:    1.  Persistent afib The patient has symptomatic, recurrent atrial fibrillation.  He has significant atriopathy. he has failed medical therapy with amidoarone.  Fortunately, he has been able to remain in sinus rhythm over the past few  months Chads2vasc score is 6.  he is anticoagulated with eliquis . Therapeutic strategies for afib including medicine and ablation were discussed in detail with the patient today. Risk, benefits, and alternatives to EP study and radiofrequency ablation for afib were also discussed in detail today. These risks include but are not limited to stroke, bleeding, vascular damage, tamponade, perforation, damage to the esophagus, lungs, and other structures, pulmonary vein stenosis, worsening renal function, and death. The patient understands these risk and wishes to avoid ablation at this time.  He would prefer to continue amiodarone going forward. He will contact my office if he decides to reconsider ablation.  2. Chronic systolic dysfunction/ tachycardia mediated CM Clinically improved with sinus rhythm  3. COVID 19 screen The patient denies symptoms of COVID 19 at this time.  The importance of social distancing was discussed today.  Follow-up with Dr Shirlee Latch as scheduled  I will see as needed  Current medicines are reviewed at length with the patient today.   The patient does not have concerns regarding his medicines.  The following changes were made today:  none   Patient Risk:  after full review of this patients clinical status, I feel that they are at moderate risk at this time.  Today, I have spent 20 minutes with the patient with telehealth technology discussing afib .    Randolm Idol, MD  10/04/2018 2:09 PM     [facility] [address] [place] [address] [phone] (office) [phone] (fax)

## 2018-10-09 ENCOUNTER — Other Ambulatory Visit (HOSPITAL_COMMUNITY): Payer: Self-pay

## 2018-10-09 MED ORDER — RANOLAZINE ER 500 MG PO TB12: 500.0000 mg | ORAL_TABLET | Freq: Two times a day (BID) | ORAL | 5 refills | Status: DC

## 2018-10-10 ENCOUNTER — Other Ambulatory Visit (HOSPITAL_COMMUNITY): Payer: Self-pay

## 2018-10-14 ENCOUNTER — Other Ambulatory Visit (HOSPITAL_COMMUNITY): Payer: Self-pay

## 2018-10-14 MED ORDER — APIXABAN 5 MG PO TABS: 5.0000 mg | ORAL_TABLET | Freq: Two times a day (BID) | ORAL | 5 refills | Status: DC

## 2018-10-21 ENCOUNTER — Encounter (HOSPITAL_COMMUNITY): Payer: Self-pay

## 2018-10-21 ENCOUNTER — Encounter (HOSPITAL_COMMUNITY): Payer: Medicare Other | Admitting: Cardiology

## 2019-01-25 DIAGNOSIS — Z23 Encounter for immunization: Secondary | ICD-10-CM | POA: Diagnosis not present

## 2019-06-14 ENCOUNTER — Ambulatory Visit: Payer: Medicare Other

## 2019-06-19 ENCOUNTER — Ambulatory Visit: Payer: Medicare Other

## 2019-06-22 ENCOUNTER — Ambulatory Visit: Payer: Medicare Other | Attending: Internal Medicine

## 2019-06-22 DIAGNOSIS — Z23 Encounter for immunization: Secondary | ICD-10-CM | POA: Insufficient documentation

## 2019-06-22 NOTE — Progress Notes (Signed)
   Covid-19 Vaccination Clinic  Name:  David Huynh    MRN: [id number] DOB: 08/05/1940  06/22/2019  David Huynh was observed post Covid-19 immunization for 15 minutes without incidence. He was provided with Vaccine Information Sheet and instruction to access the V-Safe system.   David Huynh was instructed to call 911 with any severe reactions post vaccine: Marland Kitchen Difficulty breathing  . Swelling of your face and throat  . A fast heartbeat  . A bad rash all over your body  . Dizziness and weakness    Immunizations Administered    Name Date Dose VIS Date Route   Pfizer COVID-19 Vaccine 06/22/2019  3:08 PM 0.3 mL 04/25/2019 Intramuscular   Manufacturer: [employer], [employer]   Lot: [id number]   NDC: [address]-2

## 2019-07-15 ENCOUNTER — Ambulatory Visit: Payer: Medicare Other | Attending: Internal Medicine

## 2019-07-15 DIAGNOSIS — Z23 Encounter for immunization: Secondary | ICD-10-CM | POA: Insufficient documentation

## 2019-07-15 NOTE — Progress Notes (Signed)
   Covid-19 Vaccination Clinic  Name:  QUAMAINE WEBB    MRN: [id number] DOB: 06/21/1940  07/15/2019  Mr. Scalzo was observed post Covid-19 immunization for 15 minutes without incident. He was provided with Vaccine Information Sheet and instruction to access the V-Safe system.   Mr. Gift was instructed to call 911 with any severe reactions post vaccine: Marland Kitchen Difficulty breathing  . Swelling of face and throat  . A fast heartbeat  . A bad rash all over body  . Dizziness and weakness   Immunizations Administered    Name Date Dose VIS Date Route   Pfizer COVID-19 Vaccine 07/15/2019  1:58 PM 0.3 mL 04/25/2019 Intramuscular   Manufacturer: [employer], [employer]   Lot: [id number]   NDC: [address]-5

## 2019-07-17 ENCOUNTER — Ambulatory Visit: Payer: Medicare Other

## 2019-11-24 ENCOUNTER — Other Ambulatory Visit: Payer: Medicare Other

## 2019-12-06 ENCOUNTER — Inpatient Hospital Stay (HOSPITAL_BASED_OUTPATIENT_CLINIC_OR_DEPARTMENT_OTHER)
Admission: EM | Admit: 2019-12-06 | Discharge: 2019-12-09 | DRG: 291 | Disposition: A | Discharge status: Home / Self Care | Payer: Medicare Other | Attending: Internal Medicine | Admitting: Internal Medicine

## 2019-12-06 ENCOUNTER — Other Ambulatory Visit: Payer: Self-pay

## 2019-12-06 ENCOUNTER — Emergency Department (HOSPITAL_BASED_OUTPATIENT_CLINIC_OR_DEPARTMENT_OTHER): Payer: Medicare Other

## 2019-12-06 ENCOUNTER — Encounter (HOSPITAL_BASED_OUTPATIENT_CLINIC_OR_DEPARTMENT_OTHER): Payer: Self-pay | Admitting: Emergency Medicine

## 2019-12-06 DIAGNOSIS — Z6841 Body Mass Index (BMI) 40.0 and over, adult: Secondary | ICD-10-CM | POA: Diagnosis not present

## 2019-12-06 DIAGNOSIS — Z7902 Long term (current) use of antithrombotics/antiplatelets: Secondary | ICD-10-CM | POA: Diagnosis not present

## 2019-12-06 DIAGNOSIS — J9601 Acute respiratory failure with hypoxia: Secondary | ICD-10-CM

## 2019-12-06 DIAGNOSIS — I130 Hypertensive heart and chronic kidney disease with heart failure and stage 1 through stage 4 chronic kidney disease, or unspecified chronic kidney disease: Secondary | ICD-10-CM | POA: Diagnosis not present

## 2019-12-06 DIAGNOSIS — I255 Ischemic cardiomyopathy: Secondary | ICD-10-CM | POA: Diagnosis not present

## 2019-12-06 DIAGNOSIS — Z20822 Contact with and (suspected) exposure to covid-19: Secondary | ICD-10-CM | POA: Diagnosis present

## 2019-12-06 DIAGNOSIS — N183 Chronic kidney disease, stage 3 unspecified: Secondary | ICD-10-CM | POA: Diagnosis not present

## 2019-12-06 DIAGNOSIS — Z7984 Long term (current) use of oral hypoglycemic drugs: Secondary | ICD-10-CM

## 2019-12-06 DIAGNOSIS — Z7951 Long term (current) use of inhaled steroids: Secondary | ICD-10-CM

## 2019-12-06 DIAGNOSIS — I4891 Unspecified atrial fibrillation: Secondary | ICD-10-CM | POA: Diagnosis present

## 2019-12-06 DIAGNOSIS — I5043 Acute on chronic combined systolic (congestive) and diastolic (congestive) heart failure: Secondary | ICD-10-CM | POA: Diagnosis not present

## 2019-12-06 DIAGNOSIS — E1122 Type 2 diabetes mellitus with diabetic chronic kidney disease: Secondary | ICD-10-CM | POA: Diagnosis not present

## 2019-12-06 DIAGNOSIS — I2510 Atherosclerotic heart disease of native coronary artery without angina pectoris: Secondary | ICD-10-CM | POA: Diagnosis present

## 2019-12-06 DIAGNOSIS — J4530 Mild persistent asthma, uncomplicated: Secondary | ICD-10-CM | POA: Diagnosis present

## 2019-12-06 DIAGNOSIS — Z955 Presence of coronary angioplasty implant and graft: Secondary | ICD-10-CM | POA: Diagnosis not present

## 2019-12-06 DIAGNOSIS — I4819 Other persistent atrial fibrillation: Secondary | ICD-10-CM | POA: Diagnosis present

## 2019-12-06 DIAGNOSIS — I509 Heart failure, unspecified: Secondary | ICD-10-CM | POA: Diagnosis not present

## 2019-12-06 DIAGNOSIS — Z79899 Other long term (current) drug therapy: Secondary | ICD-10-CM | POA: Diagnosis not present

## 2019-12-06 DIAGNOSIS — E669 Obesity, unspecified: Secondary | ICD-10-CM | POA: Diagnosis present

## 2019-12-06 DIAGNOSIS — I110 Hypertensive heart disease with heart failure: Secondary | ICD-10-CM | POA: Diagnosis not present

## 2019-12-06 DIAGNOSIS — Z7901 Long term (current) use of anticoagulants: Secondary | ICD-10-CM

## 2019-12-06 DIAGNOSIS — Z9114 Patient's other noncompliance with medication regimen: Secondary | ICD-10-CM | POA: Diagnosis not present

## 2019-12-06 DIAGNOSIS — E785 Hyperlipidemia, unspecified: Secondary | ICD-10-CM | POA: Diagnosis present

## 2019-12-06 DIAGNOSIS — I10 Essential (primary) hypertension: Secondary | ICD-10-CM | POA: Diagnosis present

## 2019-12-06 DIAGNOSIS — R0602 Shortness of breath: Secondary | ICD-10-CM | POA: Diagnosis not present

## 2019-12-06 DIAGNOSIS — J9811 Atelectasis: Secondary | ICD-10-CM | POA: Diagnosis not present

## 2019-12-06 DIAGNOSIS — R0600 Dyspnea, unspecified: Secondary | ICD-10-CM | POA: Diagnosis present

## 2019-12-06 DIAGNOSIS — J90 Pleural effusion, not elsewhere classified: Secondary | ICD-10-CM | POA: Diagnosis not present

## 2019-12-06 DIAGNOSIS — I361 Nonrheumatic tricuspid (valve) insufficiency: Secondary | ICD-10-CM | POA: Diagnosis not present

## 2019-12-06 DIAGNOSIS — I350 Nonrheumatic aortic (valve) stenosis: Secondary | ICD-10-CM | POA: Diagnosis not present

## 2019-12-06 DIAGNOSIS — E119 Type 2 diabetes mellitus without complications: Secondary | ICD-10-CM

## 2019-12-06 LAB — BASIC METABOLIC PANEL
Anion gap: 12 (ref 5–15)
BUN: 19 mg/dL (ref 8–23)
CO2: 24 mmol/L (ref 22–32)
Calcium: 9.1 mg/dL (ref 8.9–10.3)
Chloride: 102 mmol/L (ref 98–111)
Creatinine, Ser: 1.06 mg/dL (ref 0.61–1.24)
GFR calc Af Amer: >60 mL/min (ref >60)
GFR calc non Af Amer: >60 mL/min (ref >60)
Glucose, Bld: 223 mg/dL — ABNORMAL HIGH (ref 70–99)
Potassium: 4 mmol/L (ref 3.5–5.1)
Sodium: 138 mmol/L (ref 135–145)

## 2019-12-06 LAB — GLUCOSE, CAPILLARY: Glucose-Capillary: 229 mg/dL — ABNORMAL HIGH (ref 70–99)

## 2019-12-06 LAB — CBC
HCT: 43.2 % (ref 39.0–52.0)
Hemoglobin: 14.1 g/dL (ref 13.0–17.0)
MCH: 29.6 pg (ref 26.0–34.0)
MCHC: 32.6 g/dL (ref 30.0–36.0)
MCV: 90.6 fL (ref 80.0–100.0)
Platelets: 270 10*3/uL (ref 150–400)
RBC: 4.77 MIL/uL (ref 4.22–5.81)
RDW: 12.9 % (ref 11.5–15.5)
WBC: 9.6 10*3/uL (ref 4.0–10.5)
nRBC: 0 % (ref 0.0–0.2)

## 2019-12-06 LAB — SARS CORONAVIRUS 2 BY RT PCR (HOSPITAL ORDER, PERFORMED IN ~~LOC~~ HOSPITAL LAB): SARS Coronavirus 2: NEGATIVE

## 2019-12-06 LAB — BRAIN NATRIURETIC PEPTIDE: B Natriuretic Peptide: 792.4 pg/mL — ABNORMAL HIGH (ref 0.0–100.0)

## 2019-12-06 MED ORDER — APIXABAN 5 MG PO TABS
5.0000 mg | ORAL_TABLET | Freq: Two times a day (BID) | ORAL | Status: DC
  Administered 2019-12-06 – 2019-12-09 (×6): 5 mg via ORAL
  Filled 2019-12-06 (×6): qty 1

## 2019-12-06 MED ORDER — FUROSEMIDE 10 MG/ML IJ SOLN
40.0000 mg | Freq: Two times a day (BID) | INTRAMUSCULAR | Status: AC
  Administered 2019-12-07 (×2): 40 mg via INTRAVENOUS
  Filled 2019-12-06 (×2): qty 4

## 2019-12-06 MED ORDER — GLIMEPIRIDE 1 MG PO TABS
1.0000 mg | ORAL_TABLET | Freq: Every day | ORAL | Status: DC
  Filled 2019-12-06: qty 1

## 2019-12-06 MED ORDER — ATORVASTATIN CALCIUM 80 MG PO TABS
80.0000 mg | ORAL_TABLET | Freq: Every day | ORAL | Status: DC
  Administered 2019-12-07 – 2019-12-09 (×3): 80 mg via ORAL
  Filled 2019-12-06 (×3): qty 1

## 2019-12-06 MED ORDER — LORATADINE 10 MG PO TABS
10.0000 mg | ORAL_TABLET | Freq: Every day | ORAL | Status: DC
  Administered 2019-12-07 – 2019-12-09 (×3): 10 mg via ORAL
  Filled 2019-12-06 (×3): qty 1

## 2019-12-06 MED ORDER — CARVEDILOL 6.25 MG PO TABS: 18.7500 mg | ORAL_TABLET | Freq: Two times a day (BID) | ORAL | Status: DC

## 2019-12-06 MED ORDER — TORSEMIDE 20 MG PO TABS
40.0000 mg | ORAL_TABLET | Freq: Every day | ORAL | Status: DC
  Filled 2019-12-06: qty 2

## 2019-12-06 MED ORDER — INSULIN ASPART 100 UNIT/ML ~~LOC~~ SOLN
0.0000 [IU] | Freq: Every day | SUBCUTANEOUS | Status: DC
  Administered 2019-12-06: 2 [IU] via SUBCUTANEOUS

## 2019-12-06 MED ORDER — INSULIN ASPART 100 UNIT/ML ~~LOC~~ SOLN
0.0000 [IU] | Freq: Three times a day (TID) | SUBCUTANEOUS | Status: DC
  Administered 2019-12-07: 3 [IU] via SUBCUTANEOUS
  Administered 2019-12-07: 5 [IU] via SUBCUTANEOUS
  Administered 2019-12-07 – 2019-12-08 (×3): 3 [IU] via SUBCUTANEOUS
  Administered 2019-12-08 – 2019-12-09 (×2): 2 [IU] via SUBCUTANEOUS
  Administered 2019-12-09: 3 [IU] via SUBCUTANEOUS

## 2019-12-06 MED ORDER — FUROSEMIDE 10 MG/ML IJ SOLN
40.0000 mg | Freq: Once | INTRAMUSCULAR | Status: AC
  Administered 2019-12-06: 40 mg via INTRAVENOUS
  Filled 2019-12-06: qty 4

## 2019-12-06 MED ORDER — ACETAMINOPHEN 500 MG PO TABS: 500.0000 mg | ORAL_TABLET | Freq: Three times a day (TID) | ORAL | Status: DC | PRN

## 2019-12-06 MED ORDER — CLOPIDOGREL BISULFATE 75 MG PO TABS: 75.0000 mg | ORAL_TABLET | Freq: Every day | ORAL | Status: DC

## 2019-12-06 MED ORDER — RANOLAZINE ER 500 MG PO TB12
500.0000 mg | ORAL_TABLET | Freq: Two times a day (BID) | ORAL | Status: DC
  Administered 2019-12-06 – 2019-12-09 (×6): 500 mg via ORAL
  Filled 2019-12-06 (×7): qty 1

## 2019-12-06 MED ORDER — SODIUM CHLORIDE 0.9% FLUSH
3.0000 mL | Freq: Once | INTRAVENOUS | Status: DC
  Filled 2019-12-06: qty 3

## 2019-12-06 MED ORDER — AMIODARONE HCL 200 MG PO TABS
200.0000 mg | ORAL_TABLET | Freq: Every day | ORAL | Status: DC
  Administered 2019-12-07 – 2019-12-09 (×3): 200 mg via ORAL
  Filled 2019-12-06 (×4): qty 1

## 2019-12-06 MED ORDER — METOPROLOL TARTRATE 5 MG/5ML IV SOLN
5.0000 mg | Freq: Once | INTRAVENOUS | Status: AC
  Administered 2019-12-06: 5 mg via INTRAVENOUS
  Filled 2019-12-06: qty 5

## 2019-12-06 MED ORDER — NITROGLYCERIN 2 % TD OINT
1.0000 [in_us] | TOPICAL_OINTMENT | Freq: Four times a day (QID) | TRANSDERMAL | Status: DC
  Administered 2019-12-06 – 2019-12-08 (×9): 1 [in_us] via TOPICAL
  Filled 2019-12-06: qty 30
  Filled 2019-12-06: qty 1

## 2019-12-06 MED ORDER — METFORMIN HCL 500 MG PO TABS
1000.0000 mg | ORAL_TABLET | Freq: Two times a day (BID) | ORAL | Status: DC
  Administered 2019-12-07: 1000 mg via ORAL
  Filled 2019-12-06: qty 2

## 2019-12-06 MED ORDER — CARVEDILOL 6.25 MG PO TABS
18.7500 mg | ORAL_TABLET | Freq: Two times a day (BID) | ORAL | Status: DC
  Administered 2019-12-06 – 2019-12-09 (×6): 18.75 mg via ORAL
  Filled 2019-12-06 (×6): qty 1

## 2019-12-06 MED ORDER — FLUTICASONE FUROATE-VILANTEROL 100-25 MCG/INH IN AEPB
1.0000 | INHALATION_SPRAY | Freq: Every day | RESPIRATORY_TRACT | Status: DC
  Administered 2019-12-07 – 2019-12-08 (×2): 1 via RESPIRATORY_TRACT
  Filled 2019-12-06: qty 28

## 2019-12-06 NOTE — ED Notes (Signed)
Some home meds not available at [facility] ED

## 2019-12-06 NOTE — ED Notes (Signed)
SOB noted upon arrival to room. SAT 87-88% on RA. 2LNC placed, SAT now 97%.

## 2019-12-06 NOTE — ED Provider Notes (Signed)
[facility] EMERGENCY DEPARTMENT Provider Note   CSN: [id number] Arrival date & time: 12/06/19  1539     History Chief Complaint  Patient presents with  . Shortness of Breath    David Huynh is a 79 y.o. male.  HPI   Patient presents emergency room for evaluation of shortness of breath.  Patient states his symptoms started about a week ago.  Initially got a little bit better but then has progressed throughout the week.  Patient has noticed increased shortness of breath with any activity.  He also noticed increased swelling in his legs and abdomen.  He denies any chest pain.  He has not had any fevers.  Slight cough but not severe.  Patient normally does not use any oxygen at home.  He has not seen anyone for the symptoms this week   Past Medical History:  Diagnosis Date  . Allergy    seasonal  . Asthma    mild, intermittent  . CAD, multiple vessel    Three-vessel disease involving mid RCA 85% (PCI with DES), OM 2 80% in branch and ostD1 ~90%. Only the mid RCA with PCI target.   . Chronic combined systolic and diastolic CHF (congestive heart failure) (HCC)   . CKD (chronic kidney disease), stage III   . Diabetes mellitus    type 2  . Fracture of multiple ribs 02/12/2015  . Hyperlipidemia   . Hypertension   . Hypocalcemia 02/12/2015  . Liver function study, abnormal 04/26/2011  . Multiple rib fractures   . Obesity   . Persistent atrial fibrillation [facility])     Patient Active Problem List   Diagnosis Date Noted  . A-fib (HCC) 03/25/2018  . Lactic acidosis 03/25/2018  . Atrial fibrillation with rapid ventricular response (HCC) 03/25/2018  . CAD, multiple vessel   . Aortic stenosis, mild 06/28/2016  . Cardiomyopathy, ischemic 02/17/2016  . Acute on chronic combined systolic and diastolic CHF (congestive heart failure) (HCC) 02/17/2016  . Dyspnea   . Hypertensive heart disease   . Hypocalcemia 02/12/2015  . Fracture of multiple ribs 02/12/2015  . Other  specified forms of effusion, except tuberculous   . Essential hypertension   . Other and unspecified hyperlipidemia   . Hemothorax on right 01/23/2015  . Liver function study, abnormal 04/26/2011  . Carotid bruit 08/05/2010  . Obesity   . Hyperlipidemia with target LDL less than 70   . Type 2 diabetes mellitus without complication, without long-term current use of insulin (HCC)   . Mild persistent asthma   . Allergic state     Past Surgical History:  Procedure Laterality Date  . APPENDECTOMY  1975  . CARDIAC CATHETERIZATION N/A 02/17/2016   Procedure: Left Heart Cath and Coronary Angiography;  Surgeon: Marykay Lex, MD;  Location: [facility] INVASIVE CV LAB;  Service: Cardiovascular: mRCA 85% (PCI), branch of OM2 80%,  ostD1 90%; EF 35-45%  . CARDIAC CATHETERIZATION N/A 02/17/2016   Procedure: Coronary Stent Intervention;  Surgeon: Marykay Lex, MD;  Location: [facility] INVASIVE CV LAB;  Service: Cardiovascular: mRCA PCI : STENT PROMUS PREM MR 3.5X28 DES  . CARDIOVERSION N/A 03/29/2018   Procedure: CARDIOVERSION;  Surgeon: Laurey Morale, MD;  Location: [facility] ENDOSCOPY;  Service: Cardiovascular;  Laterality: N/A;  . CARDIOVERSION N/A 04/02/2018   Procedure: CARDIOVERSION;  Surgeon: Laurey Morale, MD;  Location: [facility] ENDOSCOPY;  Service: Cardiovascular;  Laterality: N/A;  . CARDIOVERSION N/A 04/19/2018   Procedure: CARDIOVERSION;  Surgeon: Laurey Morale, MD;  Location: [facility] ENDOSCOPY;  Service: Cardiovascular;  Laterality: N/A;  . CATARACT EXTRACTION  2010   b/l  . CORONARY STENT INTERVENTION N/A 04/03/2018   Procedure: CORONARY STENT INTERVENTION;  Surgeon: Lennette Bihari, MD;  Location: [facility] INVASIVE CV LAB;  Service: Cardiovascular;  Laterality: N/A;  . RETINAL LASER PROCEDURE  1985  . RIGHT/LEFT HEART CATH AND CORONARY ANGIOGRAPHY N/A 04/03/2018   Procedure: RIGHT/LEFT HEART CATH AND CORONARY ANGIOGRAPHY;  Surgeon: Laurey Morale, MD;  Location: [facility] INVASIVE CV LAB;  Service: Cardiovascular;   Laterality: N/A;  . TEE WITHOUT CARDIOVERSION N/A 03/29/2018   Procedure: TRANSESOPHAGEAL ECHOCARDIOGRAM (TEE);  Surgeon: Laurey Morale, MD;  Location: [facility] ENDOSCOPY;  Service: Cardiovascular;  Laterality: N/A;  . TEE WITHOUT CARDIOVERSION N/A 04/19/2018   Procedure: TRANSESOPHAGEAL ECHOCARDIOGRAM (TEE);  Surgeon: Laurey Morale, MD;  Location: [facility] ENDOSCOPY;  Service: Cardiovascular;  Laterality: N/A;  . TONSILLECTOMY AND ADENOIDECTOMY  1947  . TRANSTHORACIC ECHOCARDIOGRAM  02/2016   Mild LVH. Moderately reduced EF of 35-40% with diffuse hypokinesis. Indeterminate diastolic function. Likely elevated LA pressures. Mild aortic stenosis (mean gradient 10 mmHg). Mild MR. Mild LA dilation. Mild to moderate PA hypertension with 39 mmHg  . VIDEO ASSISTED THORACOSCOPY (VATS)/THOROCOTOMY Right 01/24/2015   Procedure: VIDEO ASSISTED THORACOSCOPY (VATS) FOR DRAINAGE OF RIGHT HEMOTHORAX;  Surgeon: Alleen Borne, MD;  Location: [facility] OR;  Service: Thoracic;  Laterality: Right;       Family History  Problem Relation Age of Onset  . CAD Neg Hx     Social History   Tobacco Use  . Smoking status: Never Smoker  . Smokeless tobacco: Never Used  Vaping Use  . Vaping Use: Never used  Substance Use Topics  . Alcohol use: Yes    Comment: rare use now  . Drug use: No    Home Medications Prior to Admission medications   Medication Sig Start Date End Date Taking? Authorizing Provider  acetaminophen (TYLENOL) 500 MG tablet Take 500 mg by mouth every 8 (eight) hours as needed (for pain or headaches).     [provider]  amiodarone (PACERONE) 400 MG tablet Take 200 mg by mouth daily.    [provider]  apixaban (ELIQUIS) 5 MG TABS tablet Take 1 tablet (5 mg total) by mouth 2 (two) times daily. 10/14/18   Bensimhon, Bevelyn Buckles, MD  atorvastatin (LIPITOR) 80 MG tablet Take 1 tablet (80 mg total) by mouth daily. 12/26/16 04/17/19  Marykay Lex, MD  carvedilol (COREG) 12.5 MG tablet Take 1.5  tablets (18.75 mg total) by mouth 2 (two) times daily with a meal. 05/29/18   Clegg, Amy D, NP  cholecalciferol (VITAMIN D) 1000 units tablet Take 1,000 Units by mouth daily.    [provider]  clopidogrel (PLAVIX) 75 MG tablet Take 1 tablet (75 mg total) by mouth daily with breakfast. 04/05/18   Laurey Morale, MD  fexofenadine (ALLEGRA) 30 MG tablet Take 1 tablet (30 mg total) by mouth daily. 04/04/18   Alford Highland, NP  Fluticasone-Salmeterol (ADVAIR DISKUS) 100-50 MCG/DOSE AEPB Inhale 1 puff into the lungs 2 (two) times daily. Patient taking differently: Inhale 1 puff into the lungs as needed (shortness of breath).  04/04/18   Alford Highland, NP  glimepiride (AMARYL) 1 MG tablet Take 1 tablet (1 mg total) by mouth 2 (two) times daily. Patient taking differently: Take 1 mg by mouth daily with breakfast.  01/01/17   Bradd Canary, MD  metFORMIN (GLUCOPHAGE) 1000 MG  tablet Take 1 tablet (1,000 mg total) by mouth 2 (two) times daily with a meal. 01/09/17   Bradd Canary, MD  nitroGLYCERIN (NITROSTAT) 0.4 MG SL tablet Place 1 tablet (0.4 mg total) under the tongue every 5 (five) minutes as needed for chest pain. 02/18/16   Little Ishikawa, NP  ranolazine (RANEXA) 500 MG 12 hr tablet Take 1 tablet (500 mg total) by mouth 2 (two) times daily. 10/09/18   Laurey Morale, MD  torsemide (DEMADEX) 20 MG tablet Take 2 tablets (40 mg total) by mouth daily. 04/21/18   Alford Highland, NP    Allergies    Patient has no known allergies.  Review of Systems   Review of Systems  All other systems reviewed and are negative.   Physical Exam Updated Vital Signs BP (!) 168/114 (BP Location: Right Arm)   Pulse 101   Temp 97.7 F (36.5 C) (Oral)   Resp 16   Wt (!) 138.3 kg   SpO2 97%   BMI 41.34 kg/m   Physical Exam Vitals and nursing note reviewed.  Constitutional:      Appearance: He is well-developed. He is obese. He is not diaphoretic.  HENT:     Head: Normocephalic and atraumatic.      Right Ear: External ear normal.     Left Ear: External ear normal.  Eyes:     General: No scleral icterus.       Right eye: No discharge.        Left eye: No discharge.     Conjunctiva/sclera: Conjunctivae normal.  Neck:     Trachea: No tracheal deviation.  Cardiovascular:     Rate and Rhythm: Normal rate and regular rhythm.  Pulmonary:     Effort: Pulmonary effort is normal. No respiratory distress.     Breath sounds: Normal breath sounds. No stridor. No wheezing or rales.  Abdominal:     General: Bowel sounds are normal. There is no distension.     Palpations: Abdomen is soft.     Tenderness: There is no abdominal tenderness. There is no guarding or rebound.  Musculoskeletal:        General: No tenderness.     Cervical back: Neck supple.     Right lower leg: Edema present.     Left lower leg: Edema present.     Comments: Pitting edema bilateral lower extremities  Skin:    General: Skin is warm and dry.     Findings: No rash.  Neurological:     Mental Status: He is alert.     Cranial Nerves: No cranial nerve deficit (no facial droop, extraocular movements intact, no slurred speech).     Sensory: No sensory deficit.     Motor: No abnormal muscle tone or seizure activity.     Coordination: Coordination normal.     ED Results / Procedures / Treatments   Labs (all labs ordered are listed, but only abnormal results are displayed) Labs Reviewed  BASIC METABOLIC PANEL - Abnormal; Notable for the following components:      Result Value   Glucose, Bld 223 (*)    All other components within normal limits  BRAIN NATRIURETIC PEPTIDE - Abnormal; Notable for the following components:   B Natriuretic Peptide 792.4 (*)    All other components within normal limits  CBC    EKG EKG Interpretation  Date/Time:  Saturday December 06 2019 15:46:33 EDT Ventricular Rate:  118 PR Interval:    QRS  Duration: 108 QT Interval:  329 QTC Calculation: 461 R Axis:   101 Text  Interpretation: Sinus tachycardia Multiform ventricular premature complexes , new since last tracing Borderline low voltage, extremity leads Since last tracing rate faster Confirmed by Linwood Dibbles [phone]) on 12/06/2019 3:54:41 PM   Radiology DG Chest 2 View  Result Date: 12/06/2019 CLINICAL DATA:  Shortness of breath, history coronary artery disease, CHF, stage III chronic kidney disease, type II diabetes mellitus, hypertension, persistent atrial fibrillation EXAM: CHEST - 2 VIEW COMPARISON:  03/26/2018 FINDINGS: Mild enlargement of cardiac silhouette with pulmonary vascular congestion. Mediastinal contours normal. Minimal atelectasis and questionable small pleural effusion at RIGHT base. Remaining lungs clear. No pneumothorax or acute osseous findings. IMPRESSION: Enlargement of cardiac silhouette with pulmonary vascular congestion. Mild RIGHT basilar atelectasis and questionable small RIGHT pleural effusion. Electronically Signed   By: Ulyses Southward M.D.   On: 12/06/2019 16:31    Procedures .Critical Care Performed by: Linwood Dibbles, MD Authorized by: Linwood Dibbles, MD   Critical care provider statement:    Critical care time (minutes):  30   Critical care was time spent personally by me on the following activities:  Discussions with consultants, evaluation of patient's response to treatment, examination of patient, ordering and performing treatments and interventions, ordering and review of laboratory studies, ordering and review of radiographic studies, pulse oximetry, re-evaluation of patient's condition, obtaining history from patient or surrogate and review of old charts   (including critical care time)  Medications Ordered in ED Medications  nitroGLYCERIN (NITROGLYN) 2 % ointment 1 inch (has no administration in time range)  furosemide (LASIX) injection 40 mg (40 mg Intravenous Given 12/06/19 1702)    ED Course  I have reviewed the triage vital signs and the nursing notes.  Pertinent labs &  imaging results that were available during my care of the patient were reviewed by me and considered in my medical decision making (see chart for details).  Clinical Course as of Dec 06 1807  Sat Dec 06, 2019  1716 CBC and metabolic panel normal.   [JK]  1716 Chest x-ray does show enlargement of the cardiac silhouette and pulmonary vascular congestion   [JK]  1758 BNP is elevated.     [JK]  1809 Pt is responding to lasix.     [JK]    Clinical Course User Index [JK] Linwood Dibbles, MD   MDM Rules/Calculators/A&P                          Patient presents ED for evaluation of shortness of breath.  Patient's work-up is consistent with CHF exacerbation.  He has elevated BNP and signs of vascular congestion on chest x-ray.  Patient does have evidence of fluid overload on physical exam.  I have ordered nitroglycerin and I have ordered a dose of Lasix.  Patient does admit that he ran out of his diuretic medication about a week ago.  Suspect this is the cause of his CHF exacerbation.  Patient does have a new oxygen requirement.  I will consult the medical service for admission. Final Clinical Impression(s) / ED Diagnoses Final diagnoses:  Acute on chronic congestive heart failure, unspecified heart failure type [facility])    Rx / DC Orders ED Discharge Orders    None       Linwood Dibbles, MD 12/07/19 1501

## 2019-12-06 NOTE — ED Triage Notes (Signed)
Patient states that he has had increased WOb and Sob x 1 week. The patient has 3 plus pitting edema to his lower extremities. The patient comes today with a RA sat of 84%

## 2019-12-06 NOTE — ED Notes (Signed)
Consulted EDP regarding BP, ordered home medications unavailable at this facility. See new orders.

## 2019-12-06 NOTE — H&P (Signed)
[facility] History and Physical  RICKARDO BRINEGAR KNL:[id number] DOB: 07/13/1940 DOA: 12/06/2019  Referring EDP: Lynelle Doctor PCP: Bradd Canary, MD   Chief Complaint: SOB, edema  HPI: David Huynh is a 79 y.o. male with PMH of HTN, T2DM, CHF, obesity, atrial fibrillation and asthma who presented to [facility] with SOB and edema and transferred to [facility] for CHF exacerbation.  Patient reports SOB x2 weeks but that he started feeling better about 10 days ago and then about 7 days ago reports worsening SOB and edema that persisted throughout the week and brought him to ER tonight. He ran out of Torsemide about 5 weeks ago and has not had it refilled yet. Reports 3 lb weight gain. Dyspnea present with just a few steps. Reports feeling much better now after receiving a little Lasix and on O2. He has noted some edema in his legs and abdomen and reports his legs felt like "logs this morning." Reports compliance with fluid restriction and salt restriction. Denies headache, dizziness, fever, chills, cough, chest pain, abdominal pain, nausea, vomiting, diarrhea, constipation, dysuria, hematuria, hematochezia, melena, difficulty moving arms/legs, speech difficulty, trouble eating, confusion or any other complaints.  In the outside ED: Hypertensive and requiring 2L O2. Mild tachcyardia. Labs remarkable for CBC and BMP WNL, BNP 792. COVID negative.   CXR c/w fluid overload.  Patient was given 40 mg IV Lasix and transferred to [facility] for admission of CHF exacerbation.   Review of Systems:  All other systems negative unless noted above in HPI.   Past Medical History:  Diagnosis Date  . Allergy    seasonal  . Asthma    mild, intermittent  . Atrial fibrillation with rapid ventricular response (HCC) 03/25/2018  . CAD, multiple vessel    Three-vessel disease involving mid RCA 85% (PCI with DES), OM 2 80% in branch and ostD1 ~90%. Only the mid RCA with PCI target.   . Carotid bruit 08/05/2010   Bruit noted on  left   . Chronic combined systolic and diastolic CHF (congestive heart failure) (HCC)   . CKD (chronic kidney disease), stage III   . Diabetes mellitus    type 2  . Fracture of multiple ribs 02/12/2015  . Hemothorax on right 01/23/2015  . Hyperlipidemia   . Hypertension   . Hypocalcemia 02/12/2015  . Lactic acidosis 03/25/2018  . Liver function study, abnormal 04/26/2011  . Multiple rib fractures   . Obesity   . Persistent atrial fibrillation [facility])    Past Surgical History:  Procedure Laterality Date  . APPENDECTOMY  1975  . CARDIAC CATHETERIZATION N/A 02/17/2016   Procedure: Left Heart Cath and Coronary Angiography;  Surgeon: Marykay Lex, MD;  Location: [facility] INVASIVE CV LAB;  Service: Cardiovascular: mRCA 85% (PCI), branch of OM2 80%,  ostD1 90%; EF 35-45%  . CARDIAC CATHETERIZATION N/A 02/17/2016   Procedure: Coronary Stent Intervention;  Surgeon: Marykay Lex, MD;  Location: [facility] INVASIVE CV LAB;  Service: Cardiovascular: mRCA PCI : STENT PROMUS PREM MR 3.5X28 DES  . CARDIOVERSION N/A 03/29/2018   Procedure: CARDIOVERSION;  Surgeon: Laurey Morale, MD;  Location: [facility] ENDOSCOPY;  Service: Cardiovascular;  Laterality: N/A;  . CARDIOVERSION N/A 04/02/2018   Procedure: CARDIOVERSION;  Surgeon: Laurey Morale, MD;  Location: [facility] ENDOSCOPY;  Service: Cardiovascular;  Laterality: N/A;  . CARDIOVERSION N/A 04/19/2018   Procedure: CARDIOVERSION;  Surgeon: Laurey Morale, MD;  Location: [facility] ENDOSCOPY;  Service: Cardiovascular;  Laterality: N/A;  . CATARACT EXTRACTION  2010  b/l  . CORONARY STENT INTERVENTION N/A 04/03/2018   Procedure: CORONARY STENT INTERVENTION;  Surgeon: Lennette Bihari, MD;  Location: [facility] INVASIVE CV LAB;  Service: Cardiovascular;  Laterality: N/A;  . RETINAL LASER PROCEDURE  1985  . RIGHT/LEFT HEART CATH AND CORONARY ANGIOGRAPHY N/A 04/03/2018   Procedure: RIGHT/LEFT HEART CATH AND CORONARY ANGIOGRAPHY;  Surgeon: Laurey Morale, MD;  Location: [facility] INVASIVE CV LAB;   Service: Cardiovascular;  Laterality: N/A;  . TEE WITHOUT CARDIOVERSION N/A 03/29/2018   Procedure: TRANSESOPHAGEAL ECHOCARDIOGRAM (TEE);  Surgeon: Laurey Morale, MD;  Location: [facility] ENDOSCOPY;  Service: Cardiovascular;  Laterality: N/A;  . TEE WITHOUT CARDIOVERSION N/A 04/19/2018   Procedure: TRANSESOPHAGEAL ECHOCARDIOGRAM (TEE);  Surgeon: Laurey Morale, MD;  Location: [facility] ENDOSCOPY;  Service: Cardiovascular;  Laterality: N/A;  . TONSILLECTOMY AND ADENOIDECTOMY  1947  . TRANSTHORACIC ECHOCARDIOGRAM  02/2016   Mild LVH. Moderately reduced EF of 35-40% with diffuse hypokinesis. Indeterminate diastolic function. Likely elevated LA pressures. Mild aortic stenosis (mean gradient 10 mmHg). Mild MR. Mild LA dilation. Mild to moderate PA hypertension with 39 mmHg  . VIDEO ASSISTED THORACOSCOPY (VATS)/THOROCOTOMY Right 01/24/2015   Procedure: VIDEO ASSISTED THORACOSCOPY (VATS) FOR DRAINAGE OF RIGHT HEMOTHORAX;  Surgeon: Alleen Borne, MD;  Location: [facility] OR;  Service: Thoracic;  Laterality: Right;   Social History:  reports that he has never smoked. He has never used smokeless tobacco. He reports current alcohol use. He reports that he does not use drugs.  No Known Allergies  Family History  Problem Relation Age of Onset  . CAD Neg Hx      Prior to Admission medications   Medication Sig Start Date End Date Taking? Authorizing Provider  acetaminophen (TYLENOL) 500 MG tablet Take 500 mg by mouth every 8 (eight) hours as needed (for pain or headaches).     [provider]  amiodarone (PACERONE) 400 MG tablet Take 200 mg by mouth daily.    [provider]  apixaban (ELIQUIS) 5 MG TABS tablet Take 1 tablet (5 mg total) by mouth 2 (two) times daily. 10/14/18   Bensimhon, Bevelyn Buckles, MD  atorvastatin (LIPITOR) 80 MG tablet Take 1 tablet (80 mg total) by mouth daily. 12/26/16 04/17/19  Marykay Lex, MD  carvedilol (COREG) 12.5 MG tablet Take 1.5 tablets (18.75 mg total) by mouth 2 (two)  times daily with a meal. 05/29/18   Clegg, Amy D, NP  cholecalciferol (VITAMIN D) 1000 units tablet Take 1,000 Units by mouth daily.    [provider]  clopidogrel (PLAVIX) 75 MG tablet Take 1 tablet (75 mg total) by mouth daily with breakfast. 04/05/18   Laurey Morale, MD  fexofenadine (ALLEGRA) 30 MG tablet Take 1 tablet (30 mg total) by mouth daily. 04/04/18   Alford Highland, NP  Fluticasone-Salmeterol (ADVAIR DISKUS) 100-50 MCG/DOSE AEPB Inhale 1 puff into the lungs 2 (two) times daily. Patient taking differently: Inhale 1 puff into the lungs as needed (shortness of breath).  04/04/18   Alford Highland, NP  glimepiride (AMARYL) 1 MG tablet Take 1 tablet (1 mg total) by mouth 2 (two) times daily. Patient taking differently: Take 1 mg by mouth daily with breakfast.  01/01/17   Bradd Canary, MD  metFORMIN (GLUCOPHAGE) 1000 MG tablet Take 1 tablet (1,000 mg total) by mouth 2 (two) times daily with a meal. 01/09/17   Bradd Canary, MD  nitroGLYCERIN (NITROSTAT) 0.4 MG SL tablet Place 1 tablet (0.4 mg total) under the tongue every  5 (five) minutes as needed for chest pain. 02/18/16   Little Ishikawa, NP  ranolazine (RANEXA) 500 MG 12 hr tablet Take 1 tablet (500 mg total) by mouth 2 (two) times daily. 10/09/18   Laurey Morale, MD  torsemide (DEMADEX) 20 MG tablet Take 2 tablets (40 mg total) by mouth daily. 04/21/18   Alford Highland, NP   Physical Exam: Vitals:   12/06/19 2030 12/06/19 2115 12/06/19 2130 12/06/19 2242  BP: (!) 172/117 (!) 160/113 (!) 160/101 (!) 167/110  Pulse: 101 93 95 96  Resp: (!) 27 (!) 30 23 18   Temp:  98.2 F (36.8 C)  98.1 F (36.7 C)  TempSrc:    Oral  SpO2: 98% 97% 98% 94%  Weight:    (!) 134.3 kg  Height:    6' (1.829 m)    Wt Readings from Last 3 Encounters:  12/06/19 (!) 134.3 kg  10/04/18 121.1 kg  07/30/18 126.3 kg    . General:  Appears calm and comfortable. AAOx4. Obese. . Eyes: EOMI, normal lids, irises & conjunctiva . ENT: grossly  normal hearing, lips & tongue . Neck: normal ROM . Cardiovascular: RRR, no m/r/g. 3+ LE pitting edema to knees.  08/01/18 Respiratory: Mild crackles at bases bilaterally with decreased air movement. Normal respiratory effort. . Abdomen: soft, non tender . Skin: no rash or induration seen on limited exam . Musculoskeletal: grossly normal tone BUE/BLE . Psychiatric: grossly normal mood and affect, speech fluent and appropriate . Neurologic: grossly non-focal.          Labs on Admission:  Basic Metabolic Panel: Recent Labs  Lab 12/06/19 1551  NA 138  K 4.0  CL 102  CO2 24  GLUCOSE 223*  BUN 19  CREATININE 1.06  CALCIUM 9.1   Liver Function Tests: No results for input(s): AST, ALT, ALKPHOS, BILITOT, PROT, ALBUMIN in the last 168 hours. No results for input(s): LIPASE, AMYLASE in the last 168 hours. No results for input(s): AMMONIA in the last 168 hours. CBC: Recent Labs  Lab 12/06/19 1551  WBC 9.6  HGB 14.1  HCT 43.2  MCV 90.6  PLT 270   Cardiac Enzymes: No results for input(s): CKTOTAL, CKMB, CKMBINDEX, TROPONINI in the last 168 hours.  BNP (last 3 results) Recent Labs    12/06/19 1551  BNP 792.4*    ProBNP (last 3 results) No results for input(s): PROBNP in the last 8760 hours.  CBG: Recent Labs  Lab 12/06/19 2316  GLUCAP 229*    Radiological Exams on Admission: DG Chest 2 View  Result Date: 12/06/2019 CLINICAL DATA:  Shortness of breath, history coronary artery disease, CHF, stage III chronic kidney disease, type II diabetes mellitus, hypertension, persistent atrial fibrillation EXAM: CHEST - 2 VIEW COMPARISON:  03/26/2018 FINDINGS: Mild enlargement of cardiac silhouette with pulmonary vascular congestion. Mediastinal contours normal. Minimal atelectasis and questionable small pleural effusion at RIGHT base. Remaining lungs clear. No pneumothorax or acute osseous findings. IMPRESSION: Enlargement of cardiac silhouette with pulmonary vascular congestion. Mild  RIGHT basilar atelectasis and questionable small RIGHT pleural effusion. Electronically Signed   By: 13/04/2018 M.D.   On: 12/06/2019 16:31    EKG: Independently reviewed. HR 120. Sinus rhythm. QTc 461. No STEMI. PVCs present.   Assessment/Plan Principal Problem:   Acute on chronic combined systolic (congestive) and diastolic (congestive) heart failure (HCC) Active Problems:   Obesity   Hyperlipidemia with target LDL less than 70   Type 2 diabetes mellitus without complication, without  long-term current use of insulin (HCC)   Mild persistent asthma   Essential hypertension   Dyspnea   A-fib (HCC)   Acute respiratory failure with hypoxia (HCC)  80 y.o. male with PMH of HTN, T2DM, CHF, obesity, atrial fibrillation and asthma who presented to [facility] with SOB and edema and transferred to [facility] for CHF exacerbation.  CHF exacerbation Acute Hypoxic Respiratory Failure  - 7-10 days of SOB with edema, requiring 2L O2 - 3+ pitting edema - CXR c/w volume overload  - elevated BNP - denies chest pain, Trops not done at ED, draw PRN - Has not taken Torsemide for 5 weeks - Chronic systolic CHF: Prior ischemic cardiomyopathy, improved after PCI in 2017. Echo 03/2018 with EF back down to 35-40% and moderate-severe RV dysfunction in the setting of atrial fibrillation with RVR. EF 40% on TEE 04/2018.  Echo March 2020 showing EF up to 50% - Patient was given 40 mg IV Lasix at outside ED which is equivalent to home dose of Torsemide 40 mg  - Will give Lasix 40 mg IV BID on 7/25 and then further diuretic adjustments thereafter pending response - Wean O2 as able - Tele - Daily Mag, K - I's and O's  - Daily Weights - 1.5 L fluid restriction; heart healthy diet  - cont Coreg  Atrial Fibrillation - currently sinus tachycardia - cont Amiodarone and Eliquis  - Cont ranolazine 500 mg BID, QTc acceptable on today's ECG  T2DM - Last A1c 8.8 in Aug 2018; repeat ordered - cont home Metformin and Amaryl -  Sliding scale insulin   HLD - cont Lipitor  Asthma - cont home inhalers, Claritin  CKD Stage III - stable, cont to monitor   Code Status: Full DVT Prophylaxis: PTA Eliquis Family Communication: none Disposition Plan: Admit to inpatient. Patient requiring IV medication and oxygen. Patient is at high risk for further decompensation due to age and co-morbidities. Anticipate discharge home in 2-3 days.    Time spent: 50 minutes  Joselyn Arrow, MD [facility] Pager [phone]

## 2019-12-07 DIAGNOSIS — I5043 Acute on chronic combined systolic (congestive) and diastolic (congestive) heart failure: Secondary | ICD-10-CM

## 2019-12-07 LAB — GLUCOSE, CAPILLARY
Glucose-Capillary: 228 mg/dL — ABNORMAL HIGH (ref 70–99)
Glucose-Capillary: 178 mg/dL — ABNORMAL HIGH (ref 70–99)
Glucose-Capillary: 168 mg/dL — ABNORMAL HIGH (ref 70–99)
Glucose-Capillary: 195 mg/dL — ABNORMAL HIGH (ref 70–99)

## 2019-12-07 LAB — COMPREHENSIVE METABOLIC PANEL
ALT: 18 U/L (ref 0–44)
AST: 17 U/L (ref 15–41)
Albumin: 3.1 g/dL — ABNORMAL LOW (ref 3.5–5.0)
Alkaline Phosphatase: 60 U/L (ref 38–126)
Anion gap: 6 (ref 5–15)
BUN: 17 mg/dL (ref 8–23)
CO2: 30 mmol/L (ref 22–32)
Calcium: 8.7 mg/dL — ABNORMAL LOW (ref 8.9–10.3)
Chloride: 101 mmol/L (ref 98–111)
Creatinine, Ser: 1.35 mg/dL — ABNORMAL HIGH (ref 0.61–1.24)
GFR calc Af Amer: 58 mL/min — ABNORMAL LOW (ref >60)
GFR calc non Af Amer: 50 mL/min — ABNORMAL LOW (ref >60)
Glucose, Bld: 259 mg/dL — ABNORMAL HIGH (ref 70–99)
Potassium: 4.1 mmol/L (ref 3.5–5.1)
Sodium: 137 mmol/L (ref 135–145)
Total Bilirubin: 1 mg/dL (ref 0.3–1.2)
Total Protein: 6.3 g/dL — ABNORMAL LOW (ref 6.5–8.1)

## 2019-12-07 LAB — LIPID PANEL
Cholesterol: 263 mg/dL — ABNORMAL HIGH (ref 0–200)
HDL: 39 mg/dL — ABNORMAL LOW (ref >40)
LDL Cholesterol: 198 mg/dL — ABNORMAL HIGH (ref 0–99)
Total CHOL/HDL Ratio: 6.7 RATIO
Triglycerides: 132 mg/dL (ref <150)
VLDL: 26 mg/dL (ref 0–40)

## 2019-12-07 LAB — HEMOGLOBIN A1C
Hgb A1c MFr Bld: 9.1 % — ABNORMAL HIGH (ref 4.8–5.6)
Mean Plasma Glucose: 214.47 mg/dL

## 2019-12-07 LAB — MAGNESIUM: Magnesium: 2 mg/dL (ref 1.7–2.4)

## 2019-12-07 LAB — TROPONIN I (HIGH SENSITIVITY)
Troponin I (High Sensitivity): 39 ng/L — ABNORMAL HIGH (ref <18)
Troponin I (High Sensitivity): 36 ng/L — ABNORMAL HIGH (ref <18)

## 2019-12-07 MED ORDER — POLYETHYLENE GLYCOL 3350 17 G PO PACK: 17.0000 g | PACK | Freq: Every day | ORAL | Status: DC | PRN

## 2019-12-07 MED ORDER — ACETAMINOPHEN 325 MG PO TABS
650.0000 mg | ORAL_TABLET | Freq: Four times a day (QID) | ORAL | Status: DC | PRN
  Administered 2019-12-07 – 2019-12-08 (×2): 650 mg via ORAL
  Filled 2019-12-07 (×2): qty 2

## 2019-12-07 MED ORDER — INSULIN GLARGINE 100 UNIT/ML ~~LOC~~ SOLN
10.0000 [IU] | Freq: Every day | SUBCUTANEOUS | Status: DC
  Administered 2019-12-07: 10 [IU] via SUBCUTANEOUS
  Filled 2019-12-07 (×2): qty 0.1

## 2019-12-07 MED ORDER — SENNOSIDES-DOCUSATE SODIUM 8.6-50 MG PO TABS: 2.0000 | ORAL_TABLET | Freq: Every evening | ORAL | Status: DC | PRN

## 2019-12-07 NOTE — Discharge Instructions (Addendum)
Heart Failure, Diagnosis  Heart failure means that your heart is not able to pump blood in the right way. This makes it hard for your body to work well. Heart failure is usually a long-term (chronic) condition. You must take good care of yourself and follow your treatment plan from your doctor. What are the causes? This condition may be caused by:  High blood pressure.  Build up of cholesterol and fat in the arteries.  Heart attack. This injures the heart muscle.  Heart valves that do not open and close properly.  Damage of the heart muscle. This is also called cardiomyopathy.  Lung disease.  Abnormal heart rhythms. What increases the risk? The risk of heart failure goes up as a person ages. This condition is also more likely to develop in people who:  Are overweight.  Are male.  Smoke or chew tobacco.  Abuse alcohol or illegal drugs.  Have taken medicines that can damage the heart.  Have diabetes.  Have abnormal heart rhythms.  Have thyroid problems.  Have low blood counts (anemia). What are the signs or symptoms? Symptoms of this condition include:  Shortness of breath.  Coughing.  Swelling of the feet, ankles, legs, or belly.  Losing weight for no reason.  Trouble breathing.  Waking from sleep because of the need to sit up and get more air.  Rapid heartbeat.  Being very tired.  Feeling dizzy, or feeling like you may pass out (faint).  Having no desire to eat.  Feeling like you may vomit (nauseous).  Peeing (urinating) more at night.  Feeling confused. How is this treated?     This condition may be treated with:  Medicines. These can be given to treat blood pressure and to make the heart muscles stronger.  Changes in your daily life. These may include eating a healthy diet, staying at a healthy body weight, quitting tobacco and illegal drug use, or doing exercises.  Surgery. Surgery can be done to open blocked valves, or to put  devices in the heart, such as pacemakers.  A donor heart (heart transplant). You will receive a healthy heart from a donor. Follow these instructions at home:  Treat other conditions as told by your doctor. These may include high blood pressure, diabetes, thyroid disease, or abnormal heart rhythms.  Learn as much as you can about heart failure.  Get support as you need it.  Keep all follow-up visits as told by your doctor. This is important. Summary  Heart failure means that your heart is not able to pump blood in the right way.  This condition is caused by high blood pressure, heart attack, or damage of the heart muscle.  Symptoms of this condition include shortness of breath and swelling of the feet, ankles, legs, or belly. You may also feel very tired or feel like you may vomit.  You may be treated with medicines, surgery, or changes in your daily life.  Treat other health conditions as told by your doctor. This information is not intended to replace advice given to you by your health care provider. Make sure you discuss any questions you have with your health care provider. Document Revised: 07/19/2018 Document Reviewed: 07/19/2018 Elsevier Patient Education  2020 [employer].   Information on my medicine - ELIQUIS (apixaban)  This medication education was reviewed with me or my healthcare representative as part of my discharge preparation.  Why was Eliquis prescribed for you? Eliquis was prescribed for you to reduce the risk  of forming blood clots that can cause a stroke if you have a medical condition called atrial fibrillation (a type of irregular heartbeat) OR to reduce the risk of a blood clots forming after orthopedic surgery.  What do You need to know about Eliquis ? Take your Eliquis TWICE DAILY - one tablet in the morning and one tablet in the evening with or without food.  It would be best to take the doses about the same time each day.  If you have  difficulty swallowing the tablet whole please discuss with your pharmacist how to take the medication safely.  Take Eliquis exactly as prescribed by your doctor and DO NOT stop taking Eliquis without talking to the doctor who prescribed the medication.  Stopping may increase your risk of developing a new clot or stroke.  Refill your prescription before you run out.  After discharge, you should have regular check-up appointments with your healthcare provider that is prescribing your Eliquis.  In the future your dose may need to be changed if your kidney function or weight changes by a significant amount or as you get older.  What do you do if you miss a dose? If you miss a dose, take it as soon as you remember on the same day and resume taking twice daily.  Do not take more than one dose of ELIQUIS at the same time.  Important Safety Information A possible side effect of Eliquis is bleeding. You should call your healthcare provider right away if you experience any of the following: ? Bleeding from an injury or your nose that does not stop. ? Unusual colored urine (red or dark brown) or unusual colored stools (red or black). ? Unusual bruising for unknown reasons. ? A serious fall or if you hit your head (even if there is no bleeding).  Some medicines may interact with Eliquis and might increase your risk of bleeding or clotting while on Eliquis. To help avoid this, consult your healthcare provider or pharmacist prior to using any new prescription or non-prescription medications, including herbals, vitamins, non-steroidal anti-inflammatory drugs (NSAIDs) and supplements.  This website has more information on Eliquis (apixaban): [email or web address].com.cy.

## 2019-12-07 NOTE — Progress Notes (Signed)
PROGRESS NOTE    David Huynh  UVO:[id number] DOB: 1940/08/11 DOA: 12/06/2019 PCP: Bradd Canary, MD   Brief Narrative:  79 y.o. male with PMH of HTN, T2DM, CHF, obesity, atrial fibrillation and asthma who presented to [facility] with SOB and edema and transferred to [facility] for CHF exacerbation.    Assessment & Plan:   Principal Problem:   Acute on chronic combined systolic (congestive) and diastolic (congestive) heart failure (HCC) Active Problems:   Obesity   Hyperlipidemia with target LDL less than 70   Type 2 diabetes mellitus without complication, without long-term current use of insulin (HCC)   Mild persistent asthma   Essential hypertension   Dyspnea   A-fib (HCC)   Acute respiratory failure with hypoxia (HCC)  Acute congestive heart failure with reduced ejection fraction, EF 50%; Class III History of CAD with drug-eluting stent to RCA 2017 and PCO left Prox Circ 2019 -Due to medication noncompliance, ran out of torsemide -Continue aggressive diuretics-Lasix 40 mg IV twice daily -Echocardiogram 07/2018-EF 50%, global hypokinesia. -Repeat echo. -Fluid restriction, strict input and output, monitor electrolytes  History of atrial fibrillation, persistent, status post cardioversion -Continue amiodarone and Eliquis.  Coreg twice daily -On Ranexa twice daily  Diabetes mellitus type 2, uncontrolled due to hyperglycemia -Hemoglobin A1c-9.1 -Hold Metformin and Amaryl -Start Lantus 10 units daily, uptitrate as necessary -Sliding scale  Hyperlipidemia -Statin, LDL 198.  Noncompliant with his medication.  Asthma -Continue home medication  CKD stage II -Around baseline creatinine of 1.3   DVT prophylaxis: Eliquis Code Status: Full code Family Communication:  Soyla Murphy but no response. Left a voicemail.  Status is: Inpatient  Remains inpatient appropriate because:IV treatments appropriate due to intensity of illness or inability to take PO   Dispo: The patient is  from: Home              Anticipated d/c is to: Home              Anticipated d/c date is: 1 day              Patient currently is not medically stable to d/c. Still volume overloaded with DOE on IV lasix.    Body mass index is 39.55 kg/m.    Subjective: Patient feels better compared to yesterday after getting IV diuretics overnight.  Still having exertional shortness of breath and lower extremity swelling  He admits of medication noncompliance.  Tells me he was hospitalized procrastinating getting his medications refilled.  Review of Systems Otherwise negative except as per HPI, including: General: Denies fever, chills, night sweats or unintended weight loss. Resp: Denies hemoptysis Cardiac: Denies chest pain, palpitations, orthopnea, paroxysmal nocturnal dyspnea. GI: Denies abdominal pain, nausea, vomiting, diarrhea or constipation GU: Denies dysuria, frequency, hesitancy or incontinence MS: Denies muscle aches, joint pain Neuro: Denies headache, neurologic deficits (focal weakness, numbness, tingling), abnormal gait Psych: Denies anxiety, depression, SI/HI/AVH Skin: Denies new rashes or lesions ID: Denies sick contacts, exotic exposures, travel  Examination:  Constitutional: Not in acute distress Respiratory: Bilateral crackles at the bases Cardiovascular: Normal sinus rhythm, no rubs Abdomen: Nontender nondistended good bowel sounds Musculoskeletal: 3+ bilateral lower extremity pitting edema Skin: No rashes seen Neurologic: CN 2-12 grossly intact.  And nonfocal Psychiatric: Normal judgment and insight. Alert and oriented x 3. Normal mood.  Objective: Vitals:   12/07/19 0325 12/07/19 0327 12/07/19 0718 12/07/19 0818  BP:   (!) 154/106   Pulse:   88   Resp:  Temp: 97.8 F (36.6 C)     TempSrc: Oral     SpO2:   100% 99%  Weight:  (!) 132.3 kg    Height:        Intake/Output Summary (Last 24 hours) at 12/07/2019 0840 Last data filed at 12/07/2019 0700 Gross per  24 hour  Intake 220 ml  Output 1650 ml  Net -1430 ml   Filed Weights   12/06/19 1547 12/06/19 2242 12/07/19 0327  Weight: (!) 138.3 kg (!) 134.3 kg (!) 132.3 kg     Data Reviewed:   CBC: Recent Labs  Lab 12/06/19 1551  WBC 9.6  HGB 14.1  HCT 43.2  MCV 90.6  PLT 270   Basic Metabolic Panel: Recent Labs  Lab 12/06/19 1551 12/07/19 0526  NA 138 137  K 4.0 4.1  CL 102 101  CO2 24 30  GLUCOSE 223* 259*  BUN 19 17  CREATININE 1.06 1.35*  CALCIUM 9.1 8.7*  MG  --  2.0   GFR: Estimated Creatinine Clearance: 63.5 mL/min (A) (by C-G formula based on SCr of 1.35 mg/dL (H)). Liver Function Tests: Recent Labs  Lab 12/07/19 0526  AST 17  ALT 18  ALKPHOS 60  BILITOT 1.0  PROT 6.3*  ALBUMIN 3.1*   No results for input(s): LIPASE, AMYLASE in the last 168 hours. No results for input(s): AMMONIA in the last 168 hours. Coagulation Profile: No results for input(s): INR, PROTIME in the last 168 hours. Cardiac Enzymes: No results for input(s): CKTOTAL, CKMB, CKMBINDEX, TROPONINI in the last 168 hours. BNP (last 3 results) No results for input(s): PROBNP in the last 8760 hours. HbA1C: Recent Labs    12/07/19 0526  HGBA1C 9.1*   CBG: Recent Labs  Lab 12/06/19 2316 12/07/19 0616  GLUCAP 229* 228*   Lipid Profile: No results for input(s): CHOL, HDL, LDLCALC, TRIG, CHOLHDL, LDLDIRECT in the last 72 hours. Thyroid Function Tests: No results for input(s): TSH, T4TOTAL, FREET4, T3FREE, THYROIDAB in the last 72 hours. Anemia Panel: No results for input(s): VITAMINB12, FOLATE, FERRITIN, TIBC, IRON, RETICCTPCT in the last 72 hours. Sepsis Labs: No results for input(s): PROCALCITON, LATICACIDVEN in the last 168 hours.  Recent Results (from the past 240 hour(s))  SARS Coronavirus 2 by RT PCR (hospital order, performed in [facility] hospital lab) Nasopharyngeal Nasopharyngeal Swab     Status: None   Collection Time: 12/06/19  6:19 PM   Specimen: Nasopharyngeal Swab    Result Value Ref Range Status   SARS Coronavirus 2 NEGATIVE NEGATIVE Final    Comment: (NOTE) SARS-CoV-2 target nucleic acids are NOT DETECTED.  The SARS-CoV-2 RNA is generally detectable in upper and lower respiratory specimens during the acute phase of infection. The lowest concentration of SARS-CoV-2 viral copies this assay can detect is 250 copies / mL. A negative result does not preclude SARS-CoV-2 infection and should not be used as the sole basis for treatment or other patient management decisions.  A negative result may occur with improper specimen collection / handling, submission of specimen other than nasopharyngeal swab, presence of viral mutation(s) within the areas targeted by this assay, and inadequate number of viral copies (<250 copies / mL). A negative result must be combined with clinical observations, patient history, and epidemiological information.  Fact Sheet for Patients:   BoilerBrush.com.cy  Fact Sheet for Healthcare Providers: https://pope.com/  This test is not yet approved or  cleared by the [place] FDA and has been authorized for detection and/or diagnosis of  SARS-CoV-2 by FDA under an Emergency Use Authorization (EUA).  This EUA will remain in effect (meaning this test can be used) for the duration of the COVID-19 declaration under Section 564(b)(1) of the Act, 21 U.S.C. section 360bbb-3(b)(1), unless the authorization is terminated or revoked sooner.  Performed at [facility], [address] [place], [place] [address]          Radiology Studies: DG Chest 2 View  Result Date: 12/06/2019 CLINICAL DATA:  Shortness of breath, history coronary artery disease, CHF, stage III chronic kidney disease, type II diabetes mellitus, hypertension, persistent atrial fibrillation EXAM: CHEST - 2 VIEW COMPARISON:  03/26/2018 FINDINGS: Mild enlargement of cardiac silhouette with pulmonary  vascular congestion. Mediastinal contours normal. Minimal atelectasis and questionable small pleural effusion at RIGHT base. Remaining lungs clear. No pneumothorax or acute osseous findings. IMPRESSION: Enlargement of cardiac silhouette with pulmonary vascular congestion. Mild RIGHT basilar atelectasis and questionable small RIGHT pleural effusion. Electronically Signed   By: Ulyses Southward M.D.   On: 12/06/2019 16:31        Scheduled Meds: . amiodarone  200 mg Oral Daily  . apixaban  5 mg Oral BID  . atorvastatin  80 mg Oral Daily  . carvedilol  18.75 mg Oral BID WC  . fluticasone furoate-vilanterol  1 puff Inhalation Daily  . furosemide  40 mg Intravenous BID  . glimepiride  1 mg Oral Q breakfast  . insulin aspart  0-15 Units Subcutaneous TID WC  . insulin aspart  0-5 Units Subcutaneous QHS  . loratadine  10 mg Oral Daily  . metFORMIN  1,000 mg Oral BID WC  . nitroGLYCERIN  1 inch Topical Q6H  . ranolazine  500 mg Oral BID   Continuous Infusions:   LOS: 1 day   Time spent= 35 mins    Oree Hislop Joline Maxcy, MD [facility]  If 7PM-7AM, please contact night-coverage  12/07/2019, 8:40 AM

## 2019-12-08 ENCOUNTER — Inpatient Hospital Stay (HOSPITAL_COMMUNITY): Payer: Medicare Other

## 2019-12-08 DIAGNOSIS — I350 Nonrheumatic aortic (valve) stenosis: Secondary | ICD-10-CM

## 2019-12-08 DIAGNOSIS — I361 Nonrheumatic tricuspid (valve) insufficiency: Secondary | ICD-10-CM

## 2019-12-08 LAB — ECHOCARDIOGRAM COMPLETE
AR max vel: 2.27 cm2
AV Area VTI: 2.04 cm2
AV Area mean vel: 2.44 cm2
AV Mean grad: 5.5 mmHg
AV Peak grad: 9.8 mmHg
Ao pk vel: 1.57 m/s
Area-P 1/2: 3.91 cm2
Height: 72 in
S' Lateral: 4.1 cm
Weight: 4697.6 oz

## 2019-12-08 LAB — CBC
HCT: 39 % (ref 39.0–52.0)
Hemoglobin: 12.3 g/dL — ABNORMAL LOW (ref 13.0–17.0)
MCH: 28.9 pg (ref 26.0–34.0)
MCHC: 31.5 g/dL (ref 30.0–36.0)
MCV: 91.5 fL (ref 80.0–100.0)
Platelets: 239 10*3/uL (ref 150–400)
RBC: 4.26 MIL/uL (ref 4.22–5.81)
RDW: 13.1 % (ref 11.5–15.5)
WBC: 7 10*3/uL (ref 4.0–10.5)
nRBC: 0 % (ref 0.0–0.2)

## 2019-12-08 LAB — COMPREHENSIVE METABOLIC PANEL
ALT: 18 U/L (ref 0–44)
AST: 15 U/L (ref 15–41)
Albumin: 3.3 g/dL — ABNORMAL LOW (ref 3.5–5.0)
Alkaline Phosphatase: 59 U/L (ref 38–126)
Anion gap: 8 (ref 5–15)
BUN: 23 mg/dL (ref 8–23)
CO2: 28 mmol/L (ref 22–32)
Calcium: 8.9 mg/dL (ref 8.9–10.3)
Chloride: 100 mmol/L (ref 98–111)
Creatinine, Ser: 1.52 mg/dL — ABNORMAL HIGH (ref 0.61–1.24)
GFR calc Af Amer: 50 mL/min — ABNORMAL LOW (ref >60)
GFR calc non Af Amer: 43 mL/min — ABNORMAL LOW (ref >60)
Glucose, Bld: 212 mg/dL — ABNORMAL HIGH (ref 70–99)
Potassium: 4 mmol/L (ref 3.5–5.1)
Sodium: 136 mmol/L (ref 135–145)
Total Bilirubin: 0.9 mg/dL (ref 0.3–1.2)
Total Protein: 6.3 g/dL — ABNORMAL LOW (ref 6.5–8.1)

## 2019-12-08 LAB — GLUCOSE, CAPILLARY
Glucose-Capillary: 231 mg/dL — ABNORMAL HIGH (ref 70–99)
Glucose-Capillary: 198 mg/dL — ABNORMAL HIGH (ref 70–99)
Glucose-Capillary: 221 mg/dL — ABNORMAL HIGH (ref 70–99)
Glucose-Capillary: 198 mg/dL — ABNORMAL HIGH (ref 70–99)
Glucose-Capillary: 148 mg/dL — ABNORMAL HIGH (ref 70–99)
Glucose-Capillary: 164 mg/dL — ABNORMAL HIGH (ref 70–99)

## 2019-12-08 LAB — MAGNESIUM: Magnesium: 2.1 mg/dL (ref 1.7–2.4)

## 2019-12-08 MED ORDER — INSULIN GLARGINE 100 UNIT/ML ~~LOC~~ SOLN
10.0000 [IU] | Freq: Once | SUBCUTANEOUS | Status: AC
  Administered 2019-12-08: 10 [IU] via SUBCUTANEOUS
  Filled 2019-12-08: qty 0.1

## 2019-12-08 MED ORDER — INSULIN GLARGINE 100 UNIT/ML ~~LOC~~ SOLN
13.0000 [IU] | Freq: Every day | SUBCUTANEOUS | Status: DC
  Administered 2019-12-09: 13 [IU] via SUBCUTANEOUS
  Filled 2019-12-08: qty 0.13

## 2019-12-08 MED ORDER — INSULIN GLARGINE 100 UNIT/ML ~~LOC~~ SOLN
3.0000 [IU] | Freq: Once | SUBCUTANEOUS | Status: AC
  Administered 2019-12-08: 3 [IU] via SUBCUTANEOUS
  Filled 2019-12-08: qty 0.03

## 2019-12-08 MED ORDER — TORSEMIDE 20 MG PO TABS
40.0000 mg | ORAL_TABLET | Freq: Every day | ORAL | Status: DC
  Administered 2019-12-08 – 2019-12-09 (×2): 40 mg via ORAL
  Filled 2019-12-08 (×2): qty 2

## 2019-12-08 NOTE — Progress Notes (Signed)
SATURATION QUALIFICATIONS: (This note is used to comply with regulatory documentation for home oxygen)  Patient Saturations on Room Air at Rest = 90%  Patient Saturations on Room Air while Ambulating = 89%

## 2019-12-08 NOTE — Social Work (Signed)
CSW acknowledging TOC consult. CSW will follow for PT/OT recommendations.  Verlon Au, LCSWA Clinical Social Worker

## 2019-12-08 NOTE — Progress Notes (Signed)
Inpatient Diabetes Program Recommendations  AACE/ADA: New Consensus Statement on Inpatient Glycemic Control (2015)  Target Ranges:  Prepandial:   less than 140 mg/dL      Peak postprandial:   less than 180 mg/dL (1-2 hours)      Critically ill patients:  140 - 180 mg/dL   Results for David Huynh, David Huynh (MRN [id number]) as of 12/08/2019 10:17  Ref. Range 12/07/2019 06:16 12/07/2019 11:05 12/07/2019 16:47 12/07/2019 21:28  Glucose-Capillary Latest Ref Range: 70 - 99 mg/dL 621 (H)  5 units NOVOLOG  178 (H)  3 units NOVOLOG +  10 units LANTUS  168 (H)  3 units NOVOLOG  195 (H)   Results for David Huynh, David Huynh (MRN [id number]) as of 12/08/2019 10:17  Ref. Range 12/08/2019 08:15  Glucose-Capillary Latest Ref Range: 70 - 99 mg/dL 962 (H)  3 units NOVOLOG    Results for David Huynh, David Huynh (MRN [id number]) as of 12/08/2019 10:17  Ref. Range 12/29/2016 10:33 12/07/2019 05:26  Hemoglobin A1C Latest Ref Range: 4.8 - 5.6 % 8.8 (H) 9.1 (H)    Admit with: CHF Exacerbation  History: DM, CHF  Home DM Meds: Amaryl 1 mg Daily         Metformin 1000 mg BID  Current Orders: Lantus 10 units Daily      Novolog Moderate Correction Scale/ SSI (0-15 units) TID AC + HS          MD- Please consider slight increase of Lantus to 13 units Daily (30% increase)--If 10 unit dose already given this AM, please consider placing order for Lantus 3 units X 1 dose to be given as well  MD- Given pt's A1c is 9.1%, may consider increasing his home dose of Amaryl to 2 mg Daily--Would not aim for super tight CBG control at home given pt's age and co-morbidities     --Will follow patient during hospitalization--  Ambrose Finland RN, MSN, CDE Diabetes Coordinator Inpatient Glycemic Control Team Team Pager: [phone] (8a-5p)

## 2019-12-08 NOTE — Progress Notes (Addendum)
PROGRESS NOTE    David Huynh  QQP:[id number] DOB: Sep 14, 1940 DOA: 12/06/2019 PCP: Bradd Canary, MD   Brief Narrative:  79 y.o. male with PMH of HTN, T2DM, CHF, obesity, atrial fibrillation and asthma who presented to [facility] with SOB and edema and transferred to [facility] for CHF exacerbation.  He was started on IV diuretics to which she was responding well.    Assessment & Plan:   Principal Problem:   Acute on chronic combined systolic (congestive) and diastolic (congestive) heart failure (HCC) Active Problems:   Obesity   Hyperlipidemia with target LDL less than 70   Type 2 diabetes mellitus without complication, without long-term current use of insulin (HCC)   Mild persistent asthma   Essential hypertension   Dyspnea   A-fib (HCC)   Acute respiratory failure with hypoxia (HCC)  Acute congestive heart failure with reduced ejection fraction, EF 50%; still having class III symptoms History of CAD with drug-eluting stent to RCA 2017 and PCO left Prox Circ 2019 -Due to medication noncompliance, ran out of torsemide.  Still having class III symptoms but oxygenation has improved. -Continue diuretics-transition to torsemide 40 mg daily and monitor his response. -Echocardiogram 07/2018-EF 50%, global hypokinesia. -Echocardiogram-pending. -Fluid restriction, strict input and output, monitor electrolytes  History of atrial fibrillation, persistent, status post cardioversion -Continue amiodarone and Eliquis.  Coreg twice daily -On Ranexa twice daily  Diabetes mellitus type 2, uncontrolled due to hyperglycemia -Hemoglobin A1c-9.1 -Hold Metformin and Amaryl -Increase Lantus to 13 units daily -Sliding scale  Hyperlipidemia -Statin, LDL 198.  Noncompliant with his medication.  Asthma -Continue home medication  CKD stage II -Around baseline creatinine of 1.3   DVT prophylaxis: Eliquis Code Status: Full code Family Communication: No call back from wife ER Status is:  Inpatient  Remains inpatient appropriate because:IV treatments appropriate due to intensity of illness or inability to take PO   Dispo: The patient is from: Home              Anticipated d/c is to: Home              Anticipated d/c date is: 1 day              Patient currently is not medically stable to d/c.  1 more day of PO diuretics, hopefully can be discharged tomorrow.  Still gets very dyspneic with minimal exertion. Slowing down diuresis due to rising Cr.  Oxygen level seems to be improving. Body mass index is 39.82 kg/m.    Subjective: Still feeling short of breath with minimal exertion and does not feel back to his baseline yet.  Still has quite a bit of swelling in his lower extremities causing him slight difficulty to walk.  Review of Systems Otherwise negative except as per HPI, including: General: Denies fever, chills, night sweats or unintended weight loss. Resp: Denies hemoptysis Cardiac: Denies chest pain, palpitations, orthopnea, paroxysmal nocturnal dyspnea. GI: Denies abdominal pain, nausea, vomiting, diarrhea or constipation GU: Denies dysuria, frequency, hesitancy or incontinence MS: Denies muscle aches, joint pain or swelling Neuro: Denies headache, neurologic deficits (focal weakness, numbness, tingling), abnormal gait Psych: Denies anxiety, depression, SI/HI/AVH Skin: Denies new rashes or lesions ID: Denies sick contacts, exotic exposures, travel  Examination:  Constitutional: Not in acute distress Respiratory: Bibasilar crackles Cardiovascular: Normal sinus rhythm, no rubs Abdomen: Nontender nondistended good bowel sounds Musculoskeletal: 3+ bilateral lower extremity pitting edema Skin: No rashes seen Neurologic: CN 2-12 grossly intact.  And nonfocal Psychiatric: Normal judgment and  insight. Alert and oriented x 3. Normal mood. Objective: Vitals:   12/07/19 2034 12/08/19 0001 12/08/19 0328 12/08/19 0855  BP: 119/78 114/73 120/75 (!) 147/95  Pulse: 80  70 76 87  Resp: 17 20 16 16   Temp: (!) 97.5 F (36.4 C) (!) 97.5 F (36.4 C) 97.6 F (36.4 C) 98 F (36.7 C)  TempSrc: Oral Oral Oral Oral  SpO2: 94% 94% 91% 99%  Weight:  (!) 133.2 kg    Height:        Intake/Output Summary (Last 24 hours) at 12/08/2019 1037 Last data filed at 12/08/2019 0930 Gross per 24 hour  Intake 940 ml  Output 1700 ml  Net -760 ml   Filed Weights   12/06/19 2242 12/07/19 0327 12/08/19 0001  Weight: (!) 134.3 kg (!) 132.3 kg (!) 133.2 kg     Data Reviewed:   CBC: Recent Labs  Lab 12/06/19 1551 12/08/19 0615  WBC 9.6 7.0  HGB 14.1 12.3*  HCT 43.2 39.0  MCV 90.6 91.5  PLT 270 239   Basic Metabolic Panel: Recent Labs  Lab 12/06/19 1551 12/07/19 0526 12/08/19 0615  NA 138 137 136  K 4.0 4.1 4.0  CL 102 101 100  CO2 24 30 28   GLUCOSE 223* 259* 212*  BUN 19 17 23   CREATININE 1.06 1.35* 1.52*  CALCIUM 9.1 8.7* 8.9  MG  --  2.0 2.1   GFR: Estimated Creatinine Clearance: 56.5 mL/min (A) (by C-G formula based on SCr of 1.52 mg/dL (H)). Liver Function Tests: Recent Labs  Lab 12/07/19 0526 12/08/19 0615  AST 17 15  ALT 18 18  ALKPHOS 60 59  BILITOT 1.0 0.9  PROT 6.3* 6.3*  ALBUMIN 3.1* 3.3*   No results for input(s): LIPASE, AMYLASE in the last 168 hours. No results for input(s): AMMONIA in the last 168 hours. Coagulation Profile: No results for input(s): INR, PROTIME in the last 168 hours. Cardiac Enzymes: No results for input(s): CKTOTAL, CKMB, CKMBINDEX, TROPONINI in the last 168 hours. BNP (last 3 results) No results for input(s): PROBNP in the last 8760 hours. HbA1C: Recent Labs    12/07/19 0526  HGBA1C 9.1*   CBG: Recent Labs  Lab 12/07/19 1105 12/07/19 1647 12/07/19 2128 12/08/19 0622 12/08/19 0815  GLUCAP 178* 168* 195* 198* 221*   Lipid Profile: Recent Labs    12/07/19 0854  CHOL 263*  HDL 39*  LDLCALC 198*  TRIG 132  CHOLHDL 6.7   Thyroid Function Tests: No results for input(s): TSH, T4TOTAL,  FREET4, T3FREE, THYROIDAB in the last 72 hours. Anemia Panel: No results for input(s): VITAMINB12, FOLATE, FERRITIN, TIBC, IRON, RETICCTPCT in the last 72 hours. Sepsis Labs: No results for input(s): PROCALCITON, LATICACIDVEN in the last 168 hours.  Recent Results (from the past 240 hour(s))  SARS Coronavirus 2 by RT PCR (hospital order, performed in [facility] hospital lab) Nasopharyngeal Nasopharyngeal Swab     Status: None   Collection Time: 12/06/19  6:19 PM   Specimen: Nasopharyngeal Swab  Result Value Ref Range Status   SARS Coronavirus 2 NEGATIVE NEGATIVE Final    Comment: (NOTE) SARS-CoV-2 target nucleic acids are NOT DETECTED.  The SARS-CoV-2 RNA is generally detectable in upper and lower respiratory specimens during the acute phase of infection. The lowest concentration of SARS-CoV-2 viral copies this assay can detect is 250 copies / mL. A negative result does not preclude SARS-CoV-2 infection and should not be used as the sole basis for treatment or other patient management  decisions.  A negative result may occur with improper specimen collection / handling, submission of specimen other than nasopharyngeal swab, presence of viral mutation(s) within the areas targeted by this assay, and inadequate number of viral copies (<250 copies / mL). A negative result must be combined with clinical observations, patient history, and epidemiological information.  Fact Sheet for Patients:   BoilerBrush.com.cy  Fact Sheet for Healthcare Providers: https://pope.com/  This test is not yet approved or  cleared by the [place] FDA and has been authorized for detection and/or diagnosis of SARS-CoV-2 by FDA under an Emergency Use Authorization (EUA).  This EUA will remain in effect (meaning this test can be used) for the duration of the COVID-19 declaration under Section 564(b)(1) of the Act, 21 U.S.C. section 360bbb-3(b)(1), unless the  authorization is terminated or revoked sooner.  Performed at [facility], [address] [place], [place] [address]          Radiology Studies: DG Chest 2 View  Result Date: 12/06/2019 CLINICAL DATA:  Shortness of breath, history coronary artery disease, CHF, stage III chronic kidney disease, type II diabetes mellitus, hypertension, persistent atrial fibrillation EXAM: CHEST - 2 VIEW COMPARISON:  03/26/2018 FINDINGS: Mild enlargement of cardiac silhouette with pulmonary vascular congestion. Mediastinal contours normal. Minimal atelectasis and questionable small pleural effusion at RIGHT base. Remaining lungs clear. No pneumothorax or acute osseous findings. IMPRESSION: Enlargement of cardiac silhouette with pulmonary vascular congestion. Mild RIGHT basilar atelectasis and questionable small RIGHT pleural effusion. Electronically Signed   By: Ulyses Southward M.D.   On: 12/06/2019 16:31        Scheduled Meds: . amiodarone  200 mg Oral Daily  . apixaban  5 mg Oral BID  . atorvastatin  80 mg Oral Daily  . carvedilol  18.75 mg Oral BID WC  . fluticasone furoate-vilanterol  1 puff Inhalation Daily  . insulin aspart  0-15 Units Subcutaneous TID WC  . insulin aspart  0-5 Units Subcutaneous QHS  . insulin glargine  10 Units Subcutaneous Daily  . loratadine  10 mg Oral Daily  . nitroGLYCERIN  1 inch Topical Q6H  . ranolazine  500 mg Oral BID   Continuous Infusions:   LOS: 2 days   Time spent= 35 mins    Gareld Obrecht Joline Maxcy, MD [facility]  If 7PM-7AM, please contact night-coverage  12/08/2019, 10:37 AM

## 2019-12-08 NOTE — Progress Notes (Signed)
  Echocardiogram 2D Echocardiogram has been performed.  Celene Skeen 12/08/2019, 11:28 AM

## 2019-12-09 LAB — COMPREHENSIVE METABOLIC PANEL
ALT: 21 U/L (ref 0–44)
AST: 18 U/L (ref 15–41)
Albumin: 3.4 g/dL — ABNORMAL LOW (ref 3.5–5.0)
Alkaline Phosphatase: 51 U/L (ref 38–126)
Anion gap: 12 (ref 5–15)
BUN: 26 mg/dL — ABNORMAL HIGH (ref 8–23)
CO2: 24 mmol/L (ref 22–32)
Calcium: 8.8 mg/dL — ABNORMAL LOW (ref 8.9–10.3)
Chloride: 100 mmol/L (ref 98–111)
Creatinine, Ser: 1.75 mg/dL — ABNORMAL HIGH (ref 0.61–1.24)
GFR calc Af Amer: 42 mL/min — ABNORMAL LOW (ref >60)
GFR calc non Af Amer: 36 mL/min — ABNORMAL LOW (ref >60)
Glucose, Bld: 157 mg/dL — ABNORMAL HIGH (ref 70–99)
Potassium: 3.7 mmol/L (ref 3.5–5.1)
Sodium: 136 mmol/L (ref 135–145)
Total Bilirubin: 1 mg/dL (ref 0.3–1.2)
Total Protein: 6.3 g/dL — ABNORMAL LOW (ref 6.5–8.1)

## 2019-12-09 LAB — CBC
HCT: 38.3 % — ABNORMAL LOW (ref 39.0–52.0)
Hemoglobin: 12.3 g/dL — ABNORMAL LOW (ref 13.0–17.0)
MCH: 29.1 pg (ref 26.0–34.0)
MCHC: 32.1 g/dL (ref 30.0–36.0)
MCV: 90.5 fL (ref 80.0–100.0)
Platelets: 241 10*3/uL (ref 150–400)
RBC: 4.23 MIL/uL (ref 4.22–5.81)
RDW: 13 % (ref 11.5–15.5)
WBC: 7 10*3/uL (ref 4.0–10.5)
nRBC: 0 % (ref 0.0–0.2)

## 2019-12-09 LAB — GLUCOSE, CAPILLARY: Glucose-Capillary: 147 mg/dL — ABNORMAL HIGH (ref 70–99)

## 2019-12-09 LAB — MAGNESIUM: Magnesium: 2 mg/dL (ref 1.7–2.4)

## 2019-12-09 MED ORDER — GLIMEPIRIDE 1 MG PO TABS: 1.0000 mg | ORAL_TABLET | Freq: Two times a day (BID) | ORAL | 0 refills | Status: DC

## 2019-12-09 MED ORDER — ATORVASTATIN CALCIUM 80 MG PO TABS: 80.0000 mg | ORAL_TABLET | Freq: Every day | ORAL | 0 refills | Status: DC

## 2019-12-09 MED ORDER — CARVEDILOL 12.5 MG PO TABS: 18.7500 mg | ORAL_TABLET | Freq: Two times a day (BID) | ORAL | 0 refills | Status: DC

## 2019-12-09 MED ORDER — NITROGLYCERIN 0.4 MG SL SUBL: 0.4000 mg | SUBLINGUAL_TABLET | SUBLINGUAL | 0 refills | Status: AC | PRN

## 2019-12-09 MED ORDER — RANOLAZINE ER 500 MG PO TB12: 500.0000 mg | ORAL_TABLET | Freq: Two times a day (BID) | ORAL | 0 refills | Status: DC

## 2019-12-09 MED ORDER — TORSEMIDE 20 MG PO TABS: 40.0000 mg | ORAL_TABLET | Freq: Every day | ORAL | 0 refills | Status: DC

## 2019-12-09 MED ORDER — METFORMIN HCL 1000 MG PO TABS: 1000.0000 mg | ORAL_TABLET | Freq: Two times a day (BID) | ORAL | 0 refills | Status: DC

## 2019-12-09 MED ORDER — APIXABAN 5 MG PO TABS: 5.0000 mg | ORAL_TABLET | Freq: Two times a day (BID) | ORAL | 0 refills | Status: DC

## 2019-12-09 MED ORDER — AMIODARONE HCL 200 MG PO TABS: 200.0000 mg | ORAL_TABLET | Freq: Every day | ORAL | 0 refills | Status: DC

## 2019-12-09 MED ORDER — VITAMIN D 1000 UNITS PO TABS: 1000.0000 [IU] | ORAL_TABLET | Freq: Every day | ORAL | 0 refills | Status: AC

## 2019-12-09 MED FILL — ELIQUIS 5 MG TABLET: 5 | 30 days supply | Qty: 60 | Fill #0

## 2019-12-09 MED FILL — GLIMEPIRIDE 2 MG TABLET: 2 | 30 days supply | Qty: 30 | Fill #0

## 2019-12-09 MED FILL — NITROGLYCERIN 0.4 MG TAB SL: 0.4 | 7 days supply | Qty: 25 | Fill #0

## 2019-12-09 MED FILL — RANOLAZINE ER 500 MG TABLET: 500 | 30 days supply | Qty: 60 | Fill #0

## 2019-12-09 MED FILL — AMIODARONE HCL 200 MG TABS: 200 | 30 days supply | Qty: 30 | Fill #0

## 2019-12-09 MED FILL — VITAMIN D3 1,000 UNIT TAB: 25 MCG | 30 days supply | Qty: 30 | Fill #0

## 2019-12-09 MED FILL — METFORMIN HCL 1000 MG TABS: 1000 | 30 days supply | Qty: 60 | Fill #0

## 2019-12-09 MED FILL — CARVEDILOL 12.5 MG TABLET: 12.5 | 30 days supply | Qty: 90 | Fill #0

## 2019-12-09 MED FILL — ATORVASTATIN CALCIUM 80 MG: 80 | 30 days supply | Qty: 30 | Fill #0

## 2019-12-09 NOTE — Discharge Summary (Signed)
Physician Discharge Summary  David Huynh ZLD:[id number] DOB: 1941/01/25 DOA: 12/06/2019  PCP: Bradd Canary, MD  Admit date: 12/06/2019 Discharge date: 12/09/2019  Admitted From: Home Disposition: Home  Recommendations for Outpatient Follow-up:  1. Follow up with PCP in 1-2 weeks 2. Please obtain BMP/CBC in one week your next doctors visit.  3. Follow-up outpatient cardiology in 1-2 weeks, at CHF clinic. Spoke with their clinic coordinator 4. All home medications have been refilled   Discharge Condition: Stable CODE STATUS: Full code Diet recommendation: Heart healthy diet, 2 g salt, 1800 cc fluid restriction at home  Brief/Interim Summary: 79 y.o.malewith PMH ofHTN, T2DM, CHF, obesity, atrial fibrillation and asthma who presented to [facility] with SOB and edema and transferred to [facility] for CHF exacerbation.  He was started on IV diuretics to which she was responding well. Patient was diuresed in the hospital, his repeat echocardiogram showed EF down to 30% with grade 3 diastolic dysfunction.  He responded really well to diuretics and his symptoms significantly improved.  He was ambulating in the hall without any issues denied any chest pain or shortness of breath.  Notified cardiology team to arrange for follow-up outpatient for further management. Patient stable to be discharged home.   Assessment & Plan:   Principal Problem:   Acute on chronic combined systolic (congestive) and diastolic (congestive) heart failure (HCC) Active Problems:   Obesity   Hyperlipidemia with target LDL less than 70   Type 2 diabetes mellitus without complication, without long-term current use of insulin (HCC)   Mild persistent asthma   Essential hypertension   Dyspnea   A-fib (HCC)   Acute respiratory failure with hypoxia (HCC)  Acute systolic congestive heart failure with reduced ejection fraction 30%, grade 3 diastolic dysfunction.  Now euvolemic. History of CAD with drug-eluting stent to  RCA 2017 and PCO left Prox Circ 2019 -Patient's chest pain shortness of breath and orthopnea has completely resolved.  Transition him to oral diuretics 40 mg of torsemide daily.  Continue his other home medications including Coreg, Ranexa.  Other medications can be uptitrated outpatient as appropriate. -Echocardiogram 07/2018-EF 50%, global hypokinesia. -Echocardiogram-EF 30% with grade 3 diastolic dysfunction -Fluid restriction, strict input and output, monitor electrolytes  History of atrial fibrillation, persistent, status post cardioversion -Continue amiodarone and Eliquis.  Coreg twice daily -On Ranexa twice daily  Diabetes mellitus type 2, uncontrolled due to hyperglycemia -Hemoglobin A1c-9.1 -Resume his home regimen  Hyperlipidemia -Statin, LDL 198.  Noncompliant with his medication.  Asthma -Continue home medication  CKD stage II -Around baseline creatinine of 1.3   Body mass index is 39.39 kg/m.     Explained patient very extensive that he needs to follow-up with CHF clinic in the next 2 weeks or so.  He understands this.    Discharge Diagnoses:  Principal Problem:   Acute on chronic combined systolic (congestive) and diastolic (congestive) heart failure (HCC) Active Problems:   Obesity   Hyperlipidemia with target LDL less than 70   Type 2 diabetes mellitus without complication, without long-term current use of insulin (HCC)   Mild persistent asthma   Essential hypertension   Dyspnea   A-fib (HCC)   Acute respiratory failure with hypoxia (HCC)      Consultations:  None  Subjective: Patient feels great, denies any chest pain shortness of breath orthopnea.  Ambulating in the hall without any issues.  He is maintaining his saturation greater than 96% on room air.  Discharge Exam: Vitals:   12/09/19 0426  12/09/19 0809  BP: (!) 151/101 (!) 124/97  Pulse: 68 74  Resp: 18   Temp: 97.7 F (36.5 C)   SpO2: 91% 93%   Vitals:   12/09/19 0017  12/09/19 0100 12/09/19 0426 12/09/19 0809  BP: (!) 147/105  (!) 151/101 (!) 124/97  Pulse: 70  68 74  Resp: 18  18   Temp: 97.8 F (36.6 C)  97.7 F (36.5 C)   TempSrc: Oral  Oral   SpO2: 91%  91% 93%  Weight:  (!) 131.7 kg    Height:        General: Pt is alert, awake, not in acute distress Cardiovascular: RRR, S1/S2 +, no rubs, no gallops Respiratory: Very minimal bibasilar crackles Abdominal: Soft, NT, ND, bowel sounds + Extremities: 1+ bilateral lower extremity pitting edema  Discharge Instructions  Discharge Instructions    AMB referral to CHF clinic   Complete by: As directed      Allergies as of 12/09/2019   No Known Allergies     Medication List    STOP taking these medications   clopidogrel 75 MG tablet Commonly known as: PLAVIX   fexofenadine 30 MG tablet Commonly known as: ALLEGRA     TAKE these medications   acetaminophen 500 MG tablet Commonly known as: TYLENOL Take 500 mg by mouth every 8 (eight) hours as needed (for pain or headaches).   amiodarone 200 MG tablet Commonly known as: PACERONE Take 1 tablet (200 mg total) by mouth daily. What changed: medication strength   apixaban 5 MG Tabs tablet Commonly known as: ELIQUIS Take 1 tablet (5 mg total) by mouth 2 (two) times daily.   atorvastatin 80 MG tablet Commonly known as: LIPITOR Take 1 tablet (80 mg total) by mouth daily.   carvedilol 12.5 MG tablet Commonly known as: COREG Take 1.5 tablets (18.75 mg total) by mouth 2 (two) times daily with a meal.   cholecalciferol 1000 units tablet Commonly known as: VITAMIN D Take 1 tablet (1,000 Units total) by mouth daily.   Fluticasone-Salmeterol 100-50 MCG/DOSE Aepb Commonly known as: Advair Diskus Inhale 1 puff into the lungs 2 (two) times daily. What changed:   when to take this  reasons to take this   glimepiride 1 MG tablet Commonly known as: AMARYL Take 1 tablet (1 mg total) by mouth 2 (two) times daily. What changed: when to  take this   metFORMIN 1000 MG tablet Commonly known as: GLUCOPHAGE Take 1 tablet (1,000 mg total) by mouth 2 (two) times daily with a meal.   nitroGLYCERIN 0.4 MG SL tablet Commonly known as: Nitrostat Place 1 tablet (0.4 mg total) under the tongue every 5 (five) minutes as needed for chest pain.   ranolazine 500 MG 12 hr tablet Commonly known as: RANEXA Take 1 tablet (500 mg total) by mouth 2 (two) times daily.   torsemide 20 MG tablet Commonly known as: DEMADEX Take 2 tablets (40 mg total) by mouth daily.       Follow-up Information    Bradd Canary, MD. Go on 12/12/2019.   Specialty: Family Medicine Why: @1 :20pm Contact information: [address] [place] [address] [phone]        Laurey Morale, MD.   Specialty: Cardiology Contact information: [address] [place] [address] [phone]              No Known Allergies  You were cared for by a hospitalist during your hospital stay. If  you have any questions about your discharge medications or the care you received while you were in the hospital after you are discharged, you can call the unit and asked to speak with the hospitalist on call if the hospitalist that took care of you is not available. Once you are discharged, your primary care physician will handle any further medical issues. Please note that no refills for any discharge medications will be authorized once you are discharged, as it is imperative that you return to your primary care physician (or establish a relationship with a primary care physician if you do not have one) for your aftercare needs so that they can reassess your need for medications and monitor your lab values.   Procedures/Studies: DG Chest 2 View  Result Date: 12/06/2019 CLINICAL DATA:  Shortness of breath, history coronary artery disease, CHF, stage III chronic kidney disease, type II diabetes mellitus, hypertension, persistent atrial fibrillation  EXAM: CHEST - 2 VIEW COMPARISON:  03/26/2018 FINDINGS: Mild enlargement of cardiac silhouette with pulmonary vascular congestion. Mediastinal contours normal. Minimal atelectasis and questionable small pleural effusion at RIGHT base. Remaining lungs clear. No pneumothorax or acute osseous findings. IMPRESSION: Enlargement of cardiac silhouette with pulmonary vascular congestion. Mild RIGHT basilar atelectasis and questionable small RIGHT pleural effusion. Electronically Signed   By: Ulyses Southward M.D.   On: 12/06/2019 16:31   ECHOCARDIOGRAM COMPLETE  Result Date: 12/08/2019    ECHOCARDIOGRAM REPORT   Patient Name:   David Huynh Date of Exam: 12/08/2019 Medical Rec #:  [id number]         Height:       72.0 in Accession #:    [id number]        Weight:       293.6 lb Date of Birth:  09/14/1940         BSA:          2.508 m Patient Age:    79 years          BP:           147/95 mmHg Patient Gender: M                 HR:           75 bpm. Exam Location:  Inpatient Procedure: 2D Echo Indications:    cardiomegaly 429.3  History:        Patient has prior history of Echocardiogram examinations, most                 recent 07/30/2018. CAD, Arrythmias:Atrial Fibrillation; Risk                 Factors:Diabetes, Dyslipidemia and Hypertension. Hx of                 cardioversion.  Sonographer:    Celene Skeen RDCS (AE) Referring Phys: [phone] [facility] Chaunta Bejarano  Sonographer Comments: challenging windows IMPRESSIONS  1. Left ventricular ejection fraction, by estimation, is 25 to 30%. The left ventricle has severely decreased function. The left ventricle demonstrates global hypokinesis. There is mild concentric left ventricular hypertrophy. Left ventricular diastolic  parameters are consistent with Grade III diastolic dysfunction (restrictive). Elevated left atrial pressure.  2. Right ventricular systolic function is severely reduced. The right ventricular size is severely enlarged. There is normal pulmonary artery systolic  pressure. The estimated right ventricular systolic pressure is 30.2 mmHg.  3. The mitral valve is degenerative. Trivial mitral valve regurgitation. No evidence of mitral stenosis.  4.  Moderate aortic stenosis is likely present. The valve is severely calcified with restricted motion in systole, and visually appears severely stenotic. Vmax 1.6 m/s, MG 5.5 mmHG but this is understimated due to severe LV dysfunction (SVI = 24 cc/m2). The DI is 0.42 which is in the moderate range. In 2020, there was mild to moderate AS with normal LV function. Overall, this is likely moderate aortic stenosis. If there are concerns for more severe aortic stenosis would recommend an aortic valve calcium  score for clarification. The aortic valve is tricuspid. Aortic valve regurgitation is not visualized. Moderate aortic valve stenosis.  5. The inferior vena cava is dilated in size with <50% respiratory variability, suggesting right atrial pressure of 15 mmHg. Comparison(s): Changes from prior study are noted. EF is now 25-30% with severe RV failure. AS remains likely moderate. FINDINGS  Left Ventricle: Left ventricular ejection fraction, by estimation, is 25 to 30%. The left ventricle has severely decreased function. The left ventricle demonstrates global hypokinesis. The left ventricular internal cavity size was normal in size. There is mild concentric left ventricular hypertrophy. Left ventricular diastolic parameters are consistent with Grade III diastolic dysfunction (restrictive). Elevated left atrial pressure. Right Ventricle: The right ventricular size is severely enlarged. No increase in right ventricular wall thickness. Right ventricular systolic function is severely reduced. There is normal pulmonary artery systolic pressure. The tricuspid regurgitant velocity is 1.95 m/s, and with an assumed right atrial pressure of 15 mmHg, the estimated right ventricular systolic pressure is 30.2 mmHg. Left Atrium: Left atrial size was not  well visualized. Right Atrium: Right atrial size was normal in size. Pericardium: Trivial pericardial effusion is present. Mitral Valve: The mitral valve is degenerative in appearance. There is mild calcification of the anterior and posterior mitral valve leaflet(s). Mild mitral annular calcification. Trivial mitral valve regurgitation. No evidence of mitral valve stenosis. Tricuspid Valve: The tricuspid valve is grossly normal. Tricuspid valve regurgitation is mild . No evidence of tricuspid stenosis. Aortic Valve: Moderate aortic stenosis is likely present. The valve is severely calcified with restricted motion in systole, and visually appears severely stenotic. Vmax 1.6 m/s, MG 5.5 mmHG but this is understimated due to severe LV dysfunction (SVI = 24 cc/m2). The DI is 0.42 which is in the moderate range. In 2020, there was mild to moderate AS with normal LV function. Overall, this is likely moderate aortic stenosis. If there are concerns for more severe aortic stenosis would recommend an aortic valve calcium score for clarification. The aortic valve is tricuspid. . There is severe thickening and severe calcifcation of the aortic valve. Aortic valve regurgitation is not visualized. Moderate aortic stenosis is present. There is severe thickening of the aortic valve. There is severe calcifcation of the aortic valve. Aortic valve mean gradient measures 5.5 mmHg. Aortic valve peak gradient measures 9.8 mmHg. Aortic valve area, by VTI measures 2.04 cm. Pulmonic Valve: The pulmonic valve was grossly normal. Pulmonic valve regurgitation is mild. No evidence of pulmonic stenosis. Aorta: The aortic root is normal in size and structure. Venous: The inferior vena cava is dilated in size with less than 50% respiratory variability, suggesting right atrial pressure of 15 mmHg. IAS/Shunts: The atrial septum is grossly normal.  LEFT VENTRICLE PLAX 2D LVIDd:         5.10 cm  Diastology LVIDs:         4.10 cm  LV e' lateral:    7.72 cm/s LV PW:         1.80 cm  LV E/e' lateral: 11.6 LV IVS:        1.20 cm  LV e' medial:    5.44 cm/s LVOT diam:     2.50 cm  LV E/e' medial:  16.5 LV SV:         61 LV SV Index:   24 LVOT Area:     4.91 cm  LEFT ATRIUM         Index      RIGHT ATRIUM           Index LA diam:    4.50 cm 1.79 cm/m RA Area:     24.70 cm                                RA Volume:   60.10 ml  23.96 ml/m  AORTIC VALVE AV Area (Vmax):    2.27 cm AV Area (Vmean):   2.44 cm AV Area (VTI):     2.04 cm AV Vmax:           156.78 cm/s AV Vmean:          110.739 cm/s AV VTI:            0.300 m AV Peak Grad:      9.8 mmHg AV Mean Grad:      5.5 mmHg LVOT Vmax:         72.50 cm/s LVOT Vmean:        55.100 cm/s LVOT VTI:          0.125 m LVOT/AV VTI ratio: 0.42  AORTA Ao Root diam: 4.10 cm MITRAL VALVE               TRICUSPID VALVE MV Area (PHT): 3.91 cm    TR Peak grad:   15.2 mmHg MV Decel Time: 194 msec    TR Vmax:        195.00 cm/s MV E velocity: 89.50 cm/s MV A velocity: 28.28 cm/s  SHUNTS MV E/A ratio:  3.16        Systemic VTI:  0.12 m                            Systemic Diam: 2.50 cm Lennie Odor MD Electronically signed by Lennie Odor MD Signature Date/Time: 12/08/2019/3:19:56 PM    Final       The results of significant diagnostics from this hospitalization (including imaging, microbiology, ancillary and laboratory) are listed below for reference.     Microbiology: Recent Results (from the past 240 hour(s))  SARS Coronavirus 2 by RT PCR (hospital order, performed in [facility] hospital lab) Nasopharyngeal Nasopharyngeal Swab     Status: None   Collection Time: 12/06/19  6:19 PM   Specimen: Nasopharyngeal Swab  Result Value Ref Range Status   SARS Coronavirus 2 NEGATIVE NEGATIVE Final    Comment: (NOTE) SARS-CoV-2 target nucleic acids are NOT DETECTED.  The SARS-CoV-2 RNA is generally detectable in upper and lower respiratory specimens during the acute phase of infection. The lowest concentration of  SARS-CoV-2 viral copies this assay can detect is 250 copies / mL. A negative result does not preclude SARS-CoV-2 infection and should not be used as the sole basis for treatment or other patient management decisions.  A negative result may occur with improper specimen collection / handling, submission of specimen other than nasopharyngeal swab, presence of viral mutation(s) within the  areas targeted by this assay, and inadequate number of viral copies (<250 copies / mL). A negative result must be combined with clinical observations, patient history, and epidemiological information.  Fact Sheet for Patients:   BoilerBrush.com.cy  Fact Sheet for Healthcare Providers: https://pope.com/  This test is not yet approved or  cleared by the [place] FDA and has been authorized for detection and/or diagnosis of SARS-CoV-2 by FDA under an Emergency Use Authorization (EUA).  This EUA will remain in effect (meaning this test can be used) for the duration of the COVID-19 declaration under Section 564(b)(1) of the Act, 21 U.S.C. section 360bbb-3(b)(1), unless the authorization is terminated or revoked sooner.  Performed at [facility], [address] [place], [place] [address]      Labs: BNP (last 3 results) Recent Labs    12/06/19 1551  BNP 792.4*   Basic Metabolic Panel: Recent Labs  Lab 12/06/19 1551 12/07/19 0526 12/08/19 0615 12/09/19 0447  NA 138 137 136 136  K 4.0 4.1 4.0 3.7  CL 102 101 100 100  CO2 GLUCOSE 223* 259* 212* 157*  BUN 26*  CREATININE 1.06 1.35* 1.52* 1.75*  CALCIUM 9.1 8.7* 8.9 8.8*  MG  --  2.0 2.1 2.0   Liver Function Tests: Recent Labs  Lab 12/07/19 0526 12/08/19 0615 12/09/19 0447  AST ALT ALKPHOS 60 59 51  BILITOT 1.0 0.9 1.0  PROT 6.3* 6.3* 6.3*  ALBUMIN 3.1* 3.3* 3.4*   No results for input(s): LIPASE, AMYLASE in the last 168  hours. No results for input(s): AMMONIA in the last 168 hours. CBC: Recent Labs  Lab 12/06/19 1551 12/08/19 0615 12/09/19 0447  WBC 9.6 7.0 7.0  HGB 14.1 12.3* 12.3*  HCT 43.2 39.0 38.3*  MCV 90.6 91.5 90.5  PLT 270 239 241   Cardiac Enzymes: No results for input(s): CKTOTAL, CKMB, CKMBINDEX, TROPONINI in the last 168 hours. BNP: Invalid input(s): POCBNP CBG: Recent Labs  Lab 12/08/19 0815 12/08/19 1213 12/08/19 1631 12/08/19 2123 12/09/19 0558  GLUCAP 221* 198* 148* 164* 147*   D-Dimer No results for input(s): DDIMER in the last 72 hours. Hgb A1c Recent Labs    12/07/19 0526  HGBA1C 9.1*   Lipid Profile Recent Labs    12/07/19 0854  CHOL 263*  HDL 39*  LDLCALC 198*  TRIG 132  CHOLHDL 6.7   Thyroid function studies No results for input(s): TSH, T4TOTAL, T3FREE, THYROIDAB in the last 72 hours.  Invalid input(s): FREET3 Anemia work up No results for input(s): VITAMINB12, FOLATE, FERRITIN, TIBC, IRON, RETICCTPCT in the last 72 hours. Urinalysis    Component Value Date/Time   COLORURINE STRAW (A) 03/25/2018 1627   APPEARANCEUR CLEAR 03/25/2018 1627   LABSPEC >1.030 (H) 03/25/2018 1627   PHURINE 5.5 03/25/2018 1627   GLUCOSEU 100 (A) 03/25/2018 1627   HGBUR TRACE (A) 03/25/2018 1627   BILIRUBINUR NEGATIVE 03/25/2018 1627   KETONESUR NEGATIVE 03/25/2018 1627   PROTEINUR 100 (A) 03/25/2018 1627   NITRITE NEGATIVE 03/25/2018 1627   LEUKOCYTESUR NEGATIVE 03/25/2018 1627   Sepsis Labs Invalid input(s): PROCALCITONIN,  WBC,  LACTICIDVEN Microbiology Recent Results (from the past 240 hour(s))  SARS Coronavirus 2 by RT PCR (hospital order, performed in [facility] hospital lab) Nasopharyngeal Nasopharyngeal Swab     Status: None   Collection Time: 12/06/19  6:19 PM   Specimen: Nasopharyngeal Swab  Result Value Ref  Range Status   SARS Coronavirus 2 NEGATIVE NEGATIVE Final    Comment: (NOTE) SARS-CoV-2 target nucleic acids are NOT DETECTED.  The  SARS-CoV-2 RNA is generally detectable in upper and lower respiratory specimens during the acute phase of infection. The lowest concentration of SARS-CoV-2 viral copies this assay can detect is 250 copies / mL. A negative result does not preclude SARS-CoV-2 infection and should not be used as the sole basis for treatment or other patient management decisions.  A negative result may occur with improper specimen collection / handling, submission of specimen other than nasopharyngeal swab, presence of viral mutation(s) within the areas targeted by this assay, and inadequate number of viral copies (<250 copies / mL). A negative result must be combined with clinical observations, patient history, and epidemiological information.  Fact Sheet for Patients:   BoilerBrush.com.cy  Fact Sheet for Healthcare Providers: https://pope.com/  This test is not yet approved or  cleared by the [place] FDA and has been authorized for detection and/or diagnosis of SARS-CoV-2 by FDA under an Emergency Use Authorization (EUA).  This EUA will remain in effect (meaning this test can be used) for the duration of the COVID-19 declaration under Section 564(b)(1) of the Act, 21 U.S.C. section 360bbb-3(b)(1), unless the authorization is terminated or revoked sooner.  Performed at [facility], [address] [place], [place] [address]      Time coordinating discharge:  I have spent 35 minutes face to face with the patient and on the ward discussing the patients care, assessment, plan and disposition with other care givers. >50% of the time was devoted counseling the patient about the risks and benefits of treatment/Discharge disposition and coordinating care.   SIGNED:   Dimple Nanas, MD  [facility] 12/09/2019, 10:45 AM   If 7PM-7AM, please contact night-coverage

## 2019-12-09 NOTE — Progress Notes (Signed)
D/C instructions given and reviewed. No questions voiced at this time. Tele and IV removed. Waiting on TOC to deliver meds. Stated he was waiting on meds to call his transport.

## 2019-12-09 NOTE — TOC Transition Note (Signed)
Transition of Care [facility]) - CM/SW Discharge Note   Patient Details  Name: David Huynh MRN: [id number] Date of Birth: [birth year]  [facility] of Care [facility] CM/SW Contact:  Leone Haven, RN Phone Number: 12/09/2019, 9:51 AM   Clinical Narrative:    NCM offered choice, patient states he does not want a HHRN for CHF management, he has had this before.  He has transport at dc and he will have no issues getting his medications. He has no other needs.   Final next level of care: Home/Self Care Barriers to Discharge: No Barriers Identified   Patient Goals and CMS Choice Patient states their goals for this hospitalization and ongoing recovery are:: get better CMS Medicare.gov Compare Post Acute Care list provided to:: Patient Choice offered to / list presented to : Patient  Discharge Placement                       Discharge Plan and Services                  DME Agency: NA       HH Arranged: NA          Social Determinants of Health (SDOH) Interventions     Readmission Risk Interventions No flowsheet data found.

## 2019-12-10 ENCOUNTER — Telehealth (HOSPITAL_COMMUNITY): Payer: Self-pay | Admitting: Pharmacist

## 2019-12-10 MED ORDER — TORSEMIDE 20 MG PO TABS: 40.0000 mg | ORAL_TABLET | Freq: Every day | ORAL | 0 refills | Status: DC

## 2019-12-10 NOTE — Telephone Encounter (Signed)
Fifteen day supply of torsemide sent to [facility] as patient is out of medication. Will reach out to Dr. Shirlee Latch to get approval for longer term fill.   Karle Plumber, PharmD, BCPS, BCCP, CPP Heart Failure Clinic Pharmacist [phone]

## 2019-12-11 ENCOUNTER — Telehealth: Payer: Self-pay | Admitting: *Deleted

## 2019-12-11 ENCOUNTER — Other Ambulatory Visit (HOSPITAL_COMMUNITY): Payer: Self-pay | Admitting: *Deleted

## 2019-12-11 MED ORDER — TORSEMIDE 20 MG PO TABS: 40.0000 mg | ORAL_TABLET | Freq: Every day | ORAL | 3 refills | Status: DC

## 2019-12-11 NOTE — Telephone Encounter (Signed)
1st attempt. Unable to reach patient. LVM for pt to call office to schedule hospital follow up appointment.   

## 2019-12-12 ENCOUNTER — Encounter: Payer: Self-pay | Admitting: Medical

## 2019-12-12 ENCOUNTER — Other Ambulatory Visit: Payer: Self-pay

## 2019-12-12 ENCOUNTER — Ambulatory Visit (INDEPENDENT_AMBULATORY_CARE_PROVIDER_SITE_OTHER): Payer: Medicare Other | Admitting: Medical

## 2019-12-12 VITALS — BP 150/79 | HR 90 | Temp 98.2°F | Resp 17 | Ht 72.0 in | Wt 287.6 lb

## 2019-12-12 DIAGNOSIS — I5043 Acute on chronic combined systolic (congestive) and diastolic (congestive) heart failure: Secondary | ICD-10-CM | POA: Diagnosis not present

## 2019-12-12 DIAGNOSIS — J4530 Mild persistent asthma, uncomplicated: Secondary | ICD-10-CM | POA: Diagnosis not present

## 2019-12-12 DIAGNOSIS — E119 Type 2 diabetes mellitus without complications: Secondary | ICD-10-CM

## 2019-12-12 MED ORDER — FLUTICASONE-SALMETEROL 100-50 MCG/DOSE IN AEPB: 1.0000 | INHALATION_SPRAY | Freq: Two times a day (BID) | RESPIRATORY_TRACT | 0 refills | Status: DC

## 2019-12-12 NOTE — Telephone Encounter (Signed)
I have made two attempts and have been unable to reach patient. Pt has hospital follow up scheduled w/ Esperanza Richters, PA 12/12/19 @120 .

## 2019-12-12 NOTE — Patient Instructions (Signed)
Good to meet you today.  History of CHF with recent flare.  Progressive improvement with gradual weight loss since ED evaluation and hospitalization.  Oxygen saturation today 94%.  Very rare shortness of breath recently only on climbing stairs.  Recommend continue current regimen prescribed on discharge from hospital.  Please get scheduled for CMP, BNP and chest x-ray this coming Tuesday.  Keep follow-up appointment with cardiologist in September.  If you start to have signs and symptoms of CHF flare again then notify David Huynh and we will try to get you in sooner with cardiologist.   For diabetes with recent high sugars despite current treatment did place referral to Dr. Lonzo Cloud for endocrinologist.  For history of asthma with worse wheezing during spring and fall, I did prescribe your Advair inhaler.  Follow-up as regular scheduled with PCP Dr. Abner Greenspan or as needed.

## 2019-12-12 NOTE — Progress Notes (Signed)
Subjective:    Patient ID: David Huynh, male    DOB: 1941-04-11, 79 y.o.   MRN: [id number]  HPI Admitted From: Home Disposition: Home  Admit date: 12/06/2019 Discharge date: 12/09/2019  Recommendations for Outpatient Follow-up:  1. Follow up with PCP in 1-2 weeks 2. Please obtain BMP/CBC in one week your next doctors visit.  3. Follow-up outpatient cardiology in 1-2 weeks, at CHF clinic. Spoke with their clinic coordinator 4. All home medications have been refilled   Discharge Condition: Stable CODE STATUS: Full code Diet recommendation: Heart healthy diet, 2 g salt, 1800 cc fluid restriction at home  Brief/Interim Summary: 79 y.o.malewith PMH ofHTN, T2DM, CHF, obesity, atrial fibrillation and asthma who presented to [facility] with SOB and edema and transferred to [facility] for CHF exacerbation.He was started on IV diuretics to which she was responding well. Patient was diuresed in the hospital, his repeat echocardiogram showed EF down to 30% with grade 3 diastolic dysfunction.  He responded really well to diuretics and his symptoms significantly improved.  He was ambulating in the hall without any issues denied any chest pain or shortness of breath.  Notified cardiology team to arrange for follow-up outpatient for further management. Patient stable to be discharged home.   Assessment & Plan:  Principal Problem: Acute on chronic combined systolic (congestive) and diastolic (congestive) heart failure (HCC) Active Problems: Obesity Hyperlipidemia with target LDL less than 70 Type 2 diabetes mellitus without complication, without long-term current use of insulin (HCC) Mild persistent asthma Essential hypertension Dyspnea A-fib (HCC) Acute respiratory failure with hypoxia (HCC)  Acute systolic congestive heart failure with reduced ejection fraction 30%, grade 3 diastolic dysfunction.  Now euvolemic. History of CAD with drug-eluting stent to RCA 2017 and  PCO left Prox Circ 2019 -Patient's chest pain shortness of breath and orthopnea has completely resolved.  Transition him to oral diuretics 40 mg of torsemide daily.  Continue his other home medications including Coreg, Ranexa.  Other medications can be uptitrated outpatient as appropriate. -Echocardiogram 07/2018-EF 50%, global hypokinesia. -Echocardiogram-EF 30% with grade 3 diastolic dysfunction -Fluid restriction, strict input and output, monitor electrolytes  History of atrial fibrillation, persistent, status post cardioversion -Continue amiodarone and Eliquis. Coreg twice daily -On Ranexa twice daily  Diabetes mellitus type 2, uncontrolled due to hyperglycemia -Hemoglobin A1c-9.1 -Resume his home regimen  Hyperlipidemia -Statin, LDL 198. Noncompliant with his medication.  Asthma -Continue home medication  CKD stage II -Around baseline creatinine of 1.3   Body mass index is 39.39 kg/m.   Explained patient very extensive that he needs to follow-up with CHF clinic in the next 2 weeks or so.  He understands this.    Regarding follow-up date patient states that he is already called cardiologist office and reviewed his hospitalization with a provider.  He states that appointment was given to follow-up in September.  He indicated was told his condition appears stable.   Pt since leaving hospital states he is breathing a lot better. He states his weight is down by 20 lbs. He is weighing his self daily. 3 lb change since yesterday.  He mentioned he gained a total of 30 pounds from his baseline before he was hospitalized.  No muscle cramps. No chest pain or sob. Clarified mild sob if he goes up steps.   Pt not having sob/dyspnea lying supine.     Review of Systems  Constitutional: Negative for chills, fatigue and fever.  Respiratory: Negative for cough, chest tightness, shortness of breath and wheezing.  States breathing well except for very minimal shortness  of breath on climbing steps.  Cardiovascular: Negative for chest pain and palpitations.  Gastrointestinal: Negative for abdominal distention, abdominal pain, constipation, diarrhea, nausea and vomiting.  Musculoskeletal: Negative for back pain.  Skin: Negative for rash.  Neurological: Negative for dizziness, weakness and light-headedness.  Hematological: Negative for adenopathy. Does not bruise/bleed easily.  Psychiatric/Behavioral: Negative for behavioral problems and confusion.   Past Medical History:  Diagnosis Date   Allergy    seasonal   Asthma    mild, intermittent   Atrial fibrillation with rapid ventricular response (HCC) 03/25/2018   CAD, multiple vessel    Three-vessel disease involving mid RCA 85% (PCI with DES), OM 2 80% in branch and ostD1 ~90%. Only the mid RCA with PCI target.    Carotid bruit 08/05/2010   Bruit noted on left    Chronic combined systolic and diastolic CHF (congestive heart failure) (HCC)    CKD (chronic kidney disease), stage III    Diabetes mellitus    type 2   Fracture of multiple ribs 02/12/2015   Hemothorax on right 01/23/2015   Hyperlipidemia    Hypertension    Hypocalcemia 02/12/2015   Lactic acidosis 03/25/2018   Liver function study, abnormal 04/26/2011   Multiple rib fractures    Obesity    Persistent atrial fibrillation (HCC)      Social History   Socioeconomic History   Marital status: Married    Spouse name: Not on file   Number of children: Not on file   Years of education: Not on file   Highest education level: Not on file  Occupational History   Not on file  Tobacco Use   Smoking status: Never Smoker   Smokeless tobacco: Never Used  Vaping Use   Vaping Use: Never used  Substance and Sexual Activity   Alcohol use: Yes    Comment: rare use now   Drug use: No   Sexual activity: Not on file  Other Topics Concern   Not on file  Social History Narrative   Not on file   Social Determinants  of Health   Financial Resource Strain:    Difficulty of Paying Living Expenses:   Food Insecurity:    Worried About Programme researcher, broadcasting/film/video in the Last Year:    Barista in the Last Year:   Transportation Needs:    Freight forwarder (Medical):    Lack of Transportation (Non-Medical):   Physical Activity:    Days of Exercise per Week:    Minutes of Exercise per Session:   Stress:    Feeling of Stress :   Social Connections:    Frequency of Communication with Friends and Family:    Frequency of Social Gatherings with Friends and Family:    Attends Religious Services:    Active Member of Clubs or Organizations:    Attends Banker Meetings:    Marital Status:   Intimate Partner Violence:    Fear of Current or Ex-Partner:    Emotionally Abused:    Physically Abused:    Sexually Abused:     Past Surgical History:  Procedure Laterality Date   APPENDECTOMY  1975   CARDIAC CATHETERIZATION N/A 02/17/2016   Procedure: Left Heart Cath and Coronary Angiography;  Surgeon: Marykay Lex, MD;  Location: [facility] INVASIVE CV LAB;  Service: Cardiovascular: mRCA 85% (PCI), branch of OM2 80%,  ostD1 90%; EF 35-45%   CARDIAC CATHETERIZATION N/A  02/17/2016   Procedure: Coronary Stent Intervention;  Surgeon: Marykay Lex, MD;  Location: [facility] INVASIVE CV LAB;  Service: Cardiovascular: mRCA PCI : Andre Lefort PREM MR 4.9E01 DES   CARDIOVERSION N/A 03/29/2018   Procedure: CARDIOVERSION;  Surgeon: Laurey Morale, MD;  Location: [facility] ENDOSCOPY;  Service: Cardiovascular;  Laterality: N/A;   CARDIOVERSION N/A 04/02/2018   Procedure: CARDIOVERSION;  Surgeon: Laurey Morale, MD;  Location: [facility] ENDOSCOPY;  Service: Cardiovascular;  Laterality: N/A;   CARDIOVERSION N/A 04/19/2018   Procedure: CARDIOVERSION;  Surgeon: Laurey Morale, MD;  Location: [facility] ENDOSCOPY;  Service: Cardiovascular;  Laterality: N/A;   CATARACT EXTRACTION  2010   b/l   CORONARY STENT  INTERVENTION N/A 04/03/2018   Procedure: CORONARY STENT INTERVENTION;  Surgeon: Lennette Bihari, MD;  Location: [facility] INVASIVE CV LAB;  Service: Cardiovascular;  Laterality: N/A;   RETINAL LASER PROCEDURE  1985   RIGHT/LEFT HEART CATH AND CORONARY ANGIOGRAPHY N/A 04/03/2018   Procedure: RIGHT/LEFT HEART CATH AND CORONARY ANGIOGRAPHY;  Surgeon: Laurey Morale, MD;  Location: [facility] INVASIVE CV LAB;  Service: Cardiovascular;  Laterality: N/A;   TEE WITHOUT CARDIOVERSION N/A 03/29/2018   Procedure: TRANSESOPHAGEAL ECHOCARDIOGRAM (TEE);  Surgeon: Laurey Morale, MD;  Location: [facility] ENDOSCOPY;  Service: Cardiovascular;  Laterality: N/A;   TEE WITHOUT CARDIOVERSION N/A 04/19/2018   Procedure: TRANSESOPHAGEAL ECHOCARDIOGRAM (TEE);  Surgeon: Laurey Morale, MD;  Location: [facility] ENDOSCOPY;  Service: Cardiovascular;  Laterality: N/A;   TONSILLECTOMY AND ADENOIDECTOMY  1947   TRANSTHORACIC ECHOCARDIOGRAM  02/2016   Mild LVH. Moderately reduced EF of 35-40% with diffuse hypokinesis. Indeterminate diastolic function. Likely elevated LA pressures. Mild aortic stenosis (mean gradient 10 mmHg). Mild MR. Mild LA dilation. Mild to moderate PA hypertension with 39 mmHg   VIDEO ASSISTED THORACOSCOPY (VATS)/THOROCOTOMY Right 01/24/2015   Procedure: VIDEO ASSISTED THORACOSCOPY (VATS) FOR DRAINAGE OF RIGHT HEMOTHORAX;  Surgeon: Alleen Borne, MD;  Location: [facility] OR;  Service: Thoracic;  Laterality: Right;    Family History  Problem Relation Age of Onset   CAD Neg Hx     No Known Allergies  Current Outpatient Medications on File Prior to Visit  Medication Sig Dispense Refill   acetaminophen (TYLENOL) 500 MG tablet Take 500 mg by mouth every 8 (eight) hours as needed (for pain or headaches).      amiodarone (PACERONE) 200 MG tablet Take 1 tablet (200 mg total) by mouth daily. 30 tablet 0   apixaban (ELIQUIS) 5 MG TABS tablet Take 1 tablet (5 mg total) by mouth 2 (two) times daily. 60 tablet 0   atorvastatin  (LIPITOR) 80 MG tablet Take 1 tablet (80 mg total) by mouth daily. 30 tablet 0   carvedilol (COREG) 12.5 MG tablet Take 1.5 tablets (18.75 mg total) by mouth 2 (two) times daily with a meal. 90 tablet 0   cholecalciferol (VITAMIN D) 1000 units tablet Take 1 tablet (1,000 Units total) by mouth daily. 30 tablet 0   glimepiride (AMARYL) 1 MG tablet Take 1 tablet (1 mg total) by mouth 2 (two) times daily. 60 tablet 0   metFORMIN (GLUCOPHAGE) 1000 MG tablet Take 1 tablet (1,000 mg total) by mouth 2 (two) times daily with a meal. 60 tablet 0   nitroGLYCERIN (NITROSTAT) 0.4 MG SL tablet Place 1 tablet (0.4 mg total) under the tongue every 5 (five) minutes as needed for chest pain. 30 tablet 0   ranolazine (RANEXA) 500 MG 12 hr tablet Take 1 tablet (500 mg total) by mouth 2 (  two) times daily. 60 tablet 0   torsemide (DEMADEX) 20 MG tablet Take 2 tablets (40 mg total) by mouth daily. 90 tablet 3   No current facility-administered medications on file prior to visit.    BP (!) 150/79    Pulse 90    Temp 98.2 F (36.8 C) (Oral)    Resp 17    Ht 6' (1.829 m)    Wt (!) 287 lb 9.6 oz (130.5 kg)    SpO2 94%    BMI 39.01 kg/m       Objective:   Physical Exam   General Mental Status- Alert. General Appearance- Not in acute distress.   Skin General: Color- Normal Color. Moisture- Normal Moisture.  Neck Carotid Arteries- Normal color. Moisture- Normal Moisture. No carotid bruits. No JVD.  Chest and Lung Exam Auscultation: Breath Sounds:-Normal.  Cardiovascular Auscultation:Rythm- Regular. Murmurs & Other Heart Sounds:Auscultation of the heart reveals- No Murmurs.  Abdomen Inspection:-Inspeection Normal. Palpation/Percussion:Note:No mass. Palpation and Percussion of the abdomen reveal- Non Tender, Non Distended + BS, no rebound or guarding.   Neurologic Cranial Nerve exam:- CN III-XII intact(No nystagmus), symmetric smile. Strength:- 5/5 equal and symmetric strength both upper and  lower extremities.   Lower extremity-1-2+ pedal edema bilaterally.  Calves appear symmetric.  No redness or warmth.  Negative Homans' sign.    Assessment & Plan:  Good to meet you today.  History of CHF with recent flare.  Progressive improvement with gradual weight loss since ED evaluation and hospitalization.  Oxygen saturation today 94%.  Very rare shortness of breath recently only on climbing stairs.  Recommend continue current regimen prescribed on discharge from hospital.  Please get scheduled for CMP, BNP and chest x-ray this coming Tuesday.  Keep follow-up appointment with cardiologist in September.  If you start to have signs and symptoms of CHF flare again then notify us and we will try to get you in sooner with cardiologist.   For diabetes with recent high sugars despite current treatment did place referral to Dr. Lonzo Cloud for endocrinologist.  For history of asthma with worse wheezing during spring and fall, I did prescribe your Advair inhaler.  Follow-up as regular scheduled with PCP Dr. Abner Greenspan or as needed.

## 2019-12-17 ENCOUNTER — Other Ambulatory Visit (INDEPENDENT_AMBULATORY_CARE_PROVIDER_SITE_OTHER): Payer: Medicare Other

## 2019-12-17 ENCOUNTER — Ambulatory Visit (HOSPITAL_BASED_OUTPATIENT_CLINIC_OR_DEPARTMENT_OTHER)
Admission: RE | Admit: 2019-12-17 | Discharge: 2019-12-17 | Disposition: A | Discharge status: Home / Self Care | Payer: Medicare Other | Source: Ambulatory Visit | Attending: Medical | Admitting: Medical

## 2019-12-17 ENCOUNTER — Other Ambulatory Visit: Payer: Self-pay

## 2019-12-17 DIAGNOSIS — I5043 Acute on chronic combined systolic (congestive) and diastolic (congestive) heart failure: Secondary | ICD-10-CM

## 2019-12-17 DIAGNOSIS — J90 Pleural effusion, not elsewhere classified: Secondary | ICD-10-CM | POA: Diagnosis not present

## 2019-12-17 DIAGNOSIS — I509 Heart failure, unspecified: Secondary | ICD-10-CM | POA: Diagnosis not present

## 2019-12-17 LAB — COMPREHENSIVE METABOLIC PANEL
ALT: 19 U/L (ref 0–53)
AST: 19 U/L (ref 0–37)
Albumin: 4.2 g/dL (ref 3.5–5.2)
Alkaline Phosphatase: 58 U/L (ref 39–117)
BUN: 31 mg/dL — ABNORMAL HIGH (ref 6–23)
CO2: 31 mEq/L (ref 19–32)
Calcium: 9.5 mg/dL (ref 8.4–10.5)
Chloride: 99 mEq/L (ref 96–112)
Creatinine, Ser: 1.48 mg/dL (ref 0.40–1.50)
GFR: 45.92 mL/min — ABNORMAL LOW (ref >60.00)
Glucose, Bld: 97 mg/dL (ref 70–99)
Potassium: 4 mEq/L (ref 3.5–5.1)
Sodium: 138 mEq/L (ref 135–145)
Total Bilirubin: 0.5 mg/dL (ref 0.2–1.2)
Total Protein: 7.5 g/dL (ref 6.0–8.3)

## 2019-12-17 LAB — BRAIN NATRIURETIC PEPTIDE: Pro B Natriuretic peptide (BNP): 593 pg/mL — ABNORMAL HIGH (ref 0.0–100.0)

## 2020-01-06 ENCOUNTER — Encounter: Payer: Self-pay | Admitting: Medical

## 2020-01-06 ENCOUNTER — Ambulatory Visit: Payer: Medicare Other | Admitting: Internal Medicine

## 2020-01-07 ENCOUNTER — Telehealth: Payer: Self-pay | Admitting: Medical

## 2020-01-07 ENCOUNTER — Other Ambulatory Visit: Payer: Self-pay | Admitting: Medical

## 2020-01-07 MED ORDER — GLIMEPIRIDE 1 MG PO TABS
1.0000 mg | ORAL_TABLET | Freq: Two times a day (BID) | ORAL | 1 refills | Status: DC
Start: 2020-01-07 — End: 2020-01-08

## 2020-01-07 MED ORDER — METFORMIN HCL 1000 MG PO TABS: 1000.0000 mg | ORAL_TABLET | Freq: Two times a day (BID) | ORAL | 0 refills | Status: DC

## 2020-01-07 MED ORDER — METFORMIN HCL 1000 MG PO TABS: 1000.0000 mg | ORAL_TABLET | Freq: Two times a day (BID) | ORAL | 1 refills | Status: DC

## 2020-01-07 NOTE — Telephone Encounter (Signed)
Rx sent to pt pharmacy 

## 2020-01-12 ENCOUNTER — Other Ambulatory Visit (HOSPITAL_COMMUNITY): Payer: Self-pay | Admitting: Adult Health

## 2020-01-12 ENCOUNTER — Other Ambulatory Visit (HOSPITAL_COMMUNITY): Payer: Self-pay | Admitting: Cardiology

## 2020-02-04 ENCOUNTER — Other Ambulatory Visit (HOSPITAL_COMMUNITY): Payer: Self-pay | Admitting: Cardiology

## 2020-02-09 ENCOUNTER — Other Ambulatory Visit (HOSPITAL_COMMUNITY): Payer: Self-pay | Admitting: Internal Medicine

## 2020-02-09 ENCOUNTER — Other Ambulatory Visit (HOSPITAL_COMMUNITY): Payer: Self-pay | Admitting: Cardiology

## 2020-02-16 ENCOUNTER — Other Ambulatory Visit: Payer: Self-pay

## 2020-02-16 ENCOUNTER — Encounter (HOSPITAL_COMMUNITY): Payer: Self-pay | Admitting: Cardiology

## 2020-02-16 ENCOUNTER — Ambulatory Visit (HOSPITAL_COMMUNITY)
Admit: 2020-02-16 | Discharge: 2020-02-16 | Disposition: A | Discharge status: Home / Self Care | Payer: Medicare Other | Attending: Cardiology | Admitting: Cardiology

## 2020-02-16 ENCOUNTER — Other Ambulatory Visit (HOSPITAL_COMMUNITY): Payer: Self-pay | Admitting: Cardiology

## 2020-02-16 ENCOUNTER — Other Ambulatory Visit (HOSPITAL_COMMUNITY): Payer: Self-pay

## 2020-02-16 VITALS — BP 160/80 | HR 88 | Ht 72.0 in | Wt 281.6 lb

## 2020-02-16 DIAGNOSIS — Z7901 Long term (current) use of anticoagulants: Secondary | ICD-10-CM | POA: Insufficient documentation

## 2020-02-16 DIAGNOSIS — Z7951 Long term (current) use of inhaled steroids: Secondary | ICD-10-CM | POA: Insufficient documentation

## 2020-02-16 DIAGNOSIS — E1122 Type 2 diabetes mellitus with diabetic chronic kidney disease: Secondary | ICD-10-CM | POA: Insufficient documentation

## 2020-02-16 DIAGNOSIS — E785 Hyperlipidemia, unspecified: Secondary | ICD-10-CM | POA: Insufficient documentation

## 2020-02-16 DIAGNOSIS — I2510 Atherosclerotic heart disease of native coronary artery without angina pectoris: Secondary | ICD-10-CM | POA: Diagnosis not present

## 2020-02-16 DIAGNOSIS — I5022 Chronic systolic (congestive) heart failure: Secondary | ICD-10-CM | POA: Diagnosis not present

## 2020-02-16 DIAGNOSIS — Z7984 Long term (current) use of oral hypoglycemic drugs: Secondary | ICD-10-CM | POA: Diagnosis not present

## 2020-02-16 DIAGNOSIS — I480 Paroxysmal atrial fibrillation: Secondary | ICD-10-CM | POA: Diagnosis not present

## 2020-02-16 DIAGNOSIS — Z955 Presence of coronary angioplasty implant and graft: Secondary | ICD-10-CM | POA: Diagnosis not present

## 2020-02-16 DIAGNOSIS — Z79899 Other long term (current) drug therapy: Secondary | ICD-10-CM | POA: Insufficient documentation

## 2020-02-16 DIAGNOSIS — J45909 Unspecified asthma, uncomplicated: Secondary | ICD-10-CM | POA: Diagnosis not present

## 2020-02-16 DIAGNOSIS — I255 Ischemic cardiomyopathy: Secondary | ICD-10-CM | POA: Insufficient documentation

## 2020-02-16 DIAGNOSIS — N183 Chronic kidney disease, stage 3 unspecified: Secondary | ICD-10-CM | POA: Insufficient documentation

## 2020-02-16 DIAGNOSIS — I350 Nonrheumatic aortic (valve) stenosis: Secondary | ICD-10-CM | POA: Insufficient documentation

## 2020-02-16 DIAGNOSIS — I130 Hypertensive heart and chronic kidney disease with heart failure and stage 1 through stage 4 chronic kidney disease, or unspecified chronic kidney disease: Secondary | ICD-10-CM | POA: Diagnosis not present

## 2020-02-16 LAB — COMPREHENSIVE METABOLIC PANEL
ALT: 23 U/L (ref 0–44)
AST: 20 U/L (ref 15–41)
Albumin: 3.9 g/dL (ref 3.5–5.0)
Alkaline Phosphatase: 47 U/L (ref 38–126)
Anion gap: 11 (ref 5–15)
BUN: 23 mg/dL (ref 8–23)
CO2: 27 mmol/L (ref 22–32)
Calcium: 9.2 mg/dL (ref 8.9–10.3)
Chloride: 101 mmol/L (ref 98–111)
Creatinine, Ser: 1.55 mg/dL — ABNORMAL HIGH (ref 0.61–1.24)
GFR calc Af Amer: 49 mL/min — ABNORMAL LOW (ref >60)
GFR calc non Af Amer: 42 mL/min — ABNORMAL LOW (ref >60)
Glucose, Bld: 138 mg/dL — ABNORMAL HIGH (ref 70–99)
Potassium: 3.9 mmol/L (ref 3.5–5.1)
Sodium: 139 mmol/L (ref 135–145)
Total Bilirubin: 0.8 mg/dL (ref 0.3–1.2)
Total Protein: 7 g/dL (ref 6.5–8.1)

## 2020-02-16 LAB — LIPID PANEL
Cholesterol: 327 mg/dL — ABNORMAL HIGH (ref 0–200)
HDL: 41 mg/dL (ref >40)
LDL Cholesterol: 230 mg/dL — ABNORMAL HIGH (ref 0–99)
Total CHOL/HDL Ratio: 8 RATIO
Triglycerides: 281 mg/dL — ABNORMAL HIGH (ref <150)
VLDL: 56 mg/dL — ABNORMAL HIGH (ref 0–40)

## 2020-02-16 LAB — CBC
HCT: 41.8 % (ref 39.0–52.0)
Hemoglobin: 13.6 g/dL (ref 13.0–17.0)
MCH: 29.6 pg (ref 26.0–34.0)
MCHC: 32.5 g/dL (ref 30.0–36.0)
MCV: 90.9 fL (ref 80.0–100.0)
Platelets: 234 10*3/uL (ref 150–400)
RBC: 4.6 MIL/uL (ref 4.22–5.81)
RDW: 15.4 % (ref 11.5–15.5)
WBC: 6.9 10*3/uL (ref 4.0–10.5)
nRBC: 0 % (ref 0.0–0.2)

## 2020-02-16 LAB — TSH: TSH: 1.962 u[IU]/mL (ref 0.350–4.500)

## 2020-02-16 MED ORDER — SODIUM CHLORIDE 0.9% FLUSH: 3.0000 mL | Freq: Two times a day (BID) | INTRAVENOUS | Status: DC

## 2020-02-16 MED ORDER — SACUBITRIL-VALSARTAN 24-26 MG PO TABS: 1.0000 | ORAL_TABLET | Freq: Two times a day (BID) | ORAL | 5 refills | Status: DC

## 2020-02-16 NOTE — Progress Notes (Addendum)
Advanced Heart Failure Clinic Note   PCP: Bradd Canary, MD  Cardiologist: [name], MD   David Huynh is a 79 y.o. male with a history of CAD, chronic systolic CHF, HTN, HLD, DM, CKD 3, and asthma. Patient had EF 35-40% in 9/17, had cath with DES to RCA then repeat echo with EF up to 55-60% by 3/18.   Admitted 03/2018 with SOB x 3 weeks. No cardiology follow up in >1 year. Found to be in atrial fibrillation with RVR. EF down to 35-40%. Poor diuresis with IV lasix, so HF team consulted. He was started on amiodarone drip. PICC line placed with CVP markedly elevated and Coox marginal. Held off on inotropes with concern for making afib worse. Started on lasix drip. He started eliquis and had TEE/DCCV 03/29/18. Was converted to NSR, but shortly after went back into AF. Ranexa added. Attempted repeat DCCV 04/02/18, but was unsuccessful. EP consulted and recommended repeat LHC prior to repeat DCCV. Had [facility] 04/03/18 with PCI to proximal LCx.  He then had TEE-DCCV on 04/19/18 back to NSR. EF on TEE was 40%.  He saw Dr. Johney Frame to discuss ablation and opted to continue amiodarone.   Echo in 3/20 showed EF 50% with diffuse hypokinesis, normal RV, mild AS and mild MR.    He was lost to followup in this office again for over a year.  In 7/21, he was admitted for a couple of days with CHF exacerbation and was treated with IV Lasix.  Echo was done in 7/21, EF back down to 25-30%, global hypokinesis, severely decreased RV systolic function with severe RV enlargement, at least moderate aortic stenosis.  He was in NSR.    He returns today for followup of CHF.  He is in NSR today.  Main complaint is right knee pain.  He needs an eventual partial knee replacement.  He fatigues easily but denies dyspnea walking on flat ground.  He will get dyspnea walking up stairs or carrying a load.  He peripheral edema.  No orthopnea/PND.    ECG (personally reviewed): NSR, normal with QTc 460 msec.   Labs (12/19): K  4.2, creatinine 1.35, LFTs normal Las (7/21): LDL 198, K 3.7, creatinine 1.75 Labs (8/21): K 4, creatinine 1.48, BNP 593  SH: No tobacco or drug use. Rare ETOH use. Lives at home with his wife in [place]. 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  FH: No family hx of CAD or CHF.   Review of systems complete and found to be negative unless listed in HPI.   PMH: 1. CKD: Stage 3.  2. Atrial fibrillation: Paroxysmal, poorly tolerated with history of tachycardia-mediated CMP.   - DCCV x 2 in 11/19 and once in 12/19.  3. Type II diabetes.  4. HTN 5. Hyperlipidemia.  6. CAD: DES to mid RCA in 2017.   - LHC (11/19): 50% mLAD, 50% dLAD, 80-85% proximal LCx stenosis, OM1 small vessel with 90% stenois. PCI to proximal LCx.  7. Chronic systolic CHF: Suspect this was primarily tachy-cardia mediated.  - Echo (11/19): EF 35-40%, moderate to severely decreased RV systolic function.  - TEE (12/19): EF 40%, diffuse hypokinesis, normal RV size and systolic function.  - Echo (3/20): EF 50%, normal RV, mild MR, mild AS with mean gradient 14 mmHg. - Echo (7/21): EF 25-30%, global hypokinesis, severely decreased RV systolic function with severe RV enlargement, at least moderate aortic stenosis.   8. Aortic stenosis: Mild by 3/20 echo. 7/21 echo with  at least moderate AS.    Current Outpatient Medications  Medication Sig Dispense Refill  . acetaminophen (TYLENOL) 500 MG tablet Take 500 mg by mouth every 8 (eight) hours as needed (for pain or headaches).     Marland Kitchen amiodarone (PACERONE) 200 MG tablet Take 1 tablet (200 mg total) by mouth daily. 30 tablet 0  . atorvastatin (LIPITOR) 80 MG tablet Take 1 tablet (80 mg total) by mouth daily. 30 tablet 0  . cholecalciferol (VITAMIN D) 1000 units tablet Take 1 tablet (1,000 Units total) by mouth daily. 30 tablet 0  . ELIQUIS 5 MG TABS tablet TAKE 1 TABLET(5 MG) BY MOUTH TWICE DAILY 60 tablet 0  . Fluticasone-Salmeterol (ADVAIR DISKUS) 100-50 MCG/DOSE AEPB Inhale 1 puff into the  lungs 2 (two) times daily. 1 each 0  . glimepiride (AMARYL) 1 MG tablet TAKE 1 TABLET(1 MG) BY MOUTH TWICE DAILY 180 tablet 2  . metFORMIN (GLUCOPHAGE) 1000 MG tablet TAKE 1 TABLET BY MOUTH TWICE DAILY WITH A MEAL. 180 tablet 2  . nitroGLYCERIN (NITROSTAT) 0.4 MG SL tablet Place 1 tablet (0.4 mg total) under the tongue every 5 (five) minutes as needed for chest pain. 30 tablet 0  . ranolazine (RANEXA) 500 MG 12 hr tablet TAKE 1 TABLET(500 MG) BY MOUTH TWICE DAILY 60 tablet 01  . torsemide (DEMADEX) 20 MG tablet Take 2 tablets (40 mg total) by mouth daily. 90 tablet 3  . carvedilol (COREG) 12.5 MG tablet TAKE 1 AND 1/2 TABLETS BY MOUTH TWICE DAILY WITH A MEAL 90 tablet 0  . sacubitril-valsartan (ENTRESTO) 24-26 MG Take 1 tablet by mouth 2 (two) times daily. 60 tablet 5   No current facility-administered medications for this encounter.    No Known Allergies   Vitals:   02/16/20 1003  BP: (!) 160/80  Pulse: 88  SpO2: 96%  Weight: 127.7 kg (281 lb 9.6 oz)  Height: 6' (1.829 m)   Wt Readings from Last 3 Encounters:  02/16/20 127.7 kg (281 lb 9.6 oz)  12/12/19 (!) 130.5 kg (287 lb 9.6 oz)  12/09/19 (!) 131.7 kg (290 lb 6.4 oz)   PHYSICAL EXAM: General: NAD Neck: JVP 8, no thyromegaly or thyroid nodule.  Lungs: Clear to auscultation bilaterally with normal respiratory effort. CV: Nondisplaced PMI.  Heart regular S1/S2, no S3/S4, no murmur.  1+ edema 1/2 to knees bilaterally.  No carotid bruit.  Normal pedal pulses.  Abdomen: Soft, nontender, no hepatosplenomegaly, mild distention.  Skin: Intact without lesions or rashes.  Neurologic: Alert and oriented x 3.  Psych: Normal affect. Extremities: No clubbing or cyanosis.  HEENT: Normal.   ASSESSMENT & PLAN:  1. Chronic systolic CHF: Prior ischemic cardiomyopathy, improved after PCI in 2017. Echo 03/2018 with EF back down to 35-40% and moderate-severe RV dysfunction. This was in the setting of atrial fibrillation with RVR. EF 40% on  TEE 04/2018.  Echo was in 3/20, showing EF up to 50%.  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  More recently, echo in 7/21 with EF 25-30%, severe RV dysfunction.  Cause of fall in EF is uncertain.  He has not been in atrial fibrillation recently.  ?Progressive coronary disease.   NYHA class II symptoms, mild volume overload on exam.  - Continue torsemide 40 mg daily.   - I will add Entresto 24/26 bid, this will allow some degree of increased diuresis.  BMET today and in 10 days.  -Continue Coreg 18.75 mg bid.  - With  symptomatic fall in EF, I will arrange for right and left heart cath to assess filling pressures and cardiac output and to check for progressive coronary disease.  He will need to hold Eliquis the day before and day of procedure. We discussed risks/benefits and he agrees to procedure.  2. Atrial fibrillation: As above, concerned for possible tachy-mediated CMP. Had successful DC-CV on 03/29/18 but then back in atrial fibrillation.Ranolazine added, but failed DCCV again 04/02/18. DCCV 04/19/18 was successful and he remains in NSR on amiodarone + ranolazine. - Currently on ranolazine 500 mg BID, QTc acceptable on today's ECG.  - Continue amio 200 mg daily.  Check LFTs/TSH today.  He will need a regular eye exam with amiodarone use.  - Continue Eliquis 5 mg BID. CBC today.  - He opted against atrial fibrillation ablation in the past.  3. CKD: Stage 3 . BMET today.  4. CAD: History of DES to RCA in 9/17. Underwent LHC 04/03/18 with PCI to prox left circ. No chest pain but worsening dyspnea and fall in EF.  - He is on Eliquis so no ASA.   - Continue atorvastatin 80 daily, check lipids today. LDL was very high in 7/21 but he was not taking his statin.  5. DM2: Will get him on SGLT2 inhibitor in the future.  6. Aortic stenosis: At last moderate on 7/21 echo, concern for possible low gradient severe AS.   - I will assess AoV with TEE on the day of cath. We  discussed risks/benefits and he agrees to procedure.   Followup in 1 month.      [name], MD 02/16/20

## 2020-02-16 NOTE — Patient Instructions (Addendum)
START Entresto 24/26mg  (1 tab) twice a day  Labs today We will only contact you if something comes back abnormal or we need to make some changes. Otherwise no news is good news!  Your physician recommends that you schedule a follow-up appointment in: 1 month with Dr Shirlee Latch  Please call office at [phone] option 2 if you have any questions or concerns.    Dear [name], Huynh are scheduled for a TEE Transesophageal Echocardiogram and Heart Catheterization on Tuesday October 19th, 2020 with Dr. Shirlee Latch.  Please arrive at the [facility] (Main Entrance A) at [facility]: [address] [place], [place] [address] at 7am.   DIET: Nothing to eat or drink after midnight except a sip of water with medications (see medication instructions below)  Medication Instructions: HOLD Eliquis day before and day of procedure  HOLD Torsemide on the morning of your procedure  HOLD Diabetes medication  You will need to continue your anticoagulant after your procedure                  until you are told by your  Provider that it is safe to stop   Labs: were done today in office and will repeat on the day of your procedure  You will need a pre procedure COVID test    WHEN:  Saturday October 16th, 2021 anytime between 9am-3pm WHERE: COVID Test Site      [address] [place], [place] [address]  This is a drive thru testing site, you will remain in your car. Be sure to get in the line FOR PROCEDURES Once you have been swabbed you will need to remain home in quarantine until you return for your procedure.  *Special Note: Every effort is made to have your procedure done on time. Occasionally there are emergencies that occur at the hospital that may cause delays. Please be patient if a delay does occur.     Contrast Allergy: No  Plan for one night stay--bring personal belongings. Bring a current list of your medications and current insurance cards. You MUST have a responsible person to  drive you home. Someone MUST be with you the first 24 hours after you arrive home or your discharge will be delayed. Please wear clothes that are easy to get on and off and wear slip-on shoes.  Thank you for allowing [place] to care for you!   -- [place] [facility]

## 2020-02-16 NOTE — H&P (View-Only) (Signed)
Advanced Heart Failure Clinic Note   PCP: Bradd Canary, MD  Cardiologist: [name], MD   David Huynh is a 79 y.o. male with a history of CAD, chronic systolic CHF, HTN, HLD, DM, CKD 3, and asthma. Patient had EF 35-40% in 9/17, had cath with DES to RCA then repeat echo with EF up to 55-60% by 3/18.   Admitted 03/2018 with SOB x 3 weeks. No cardiology follow up in >1 year. Found to be in atrial fibrillation with RVR. EF down to 35-40%. Poor diuresis with IV lasix, so HF team consulted. He was started on amiodarone drip. PICC line placed with CVP markedly elevated and Coox marginal. Held off on inotropes with concern for making afib worse. Started on lasix drip. He started eliquis and had TEE/DCCV 03/29/18. Was converted to NSR, but shortly after went back into AF. Ranexa added. Attempted repeat DCCV 04/02/18, but was unsuccessful. EP consulted and recommended repeat LHC prior to repeat DCCV. Had [facility] 04/03/18 with PCI to proximal LCx.  He then had TEE-DCCV on 04/19/18 back to NSR. EF on TEE was 40%.  He saw Dr. Johney Frame to discuss ablation and opted to continue amiodarone.   Echo in 3/20 showed EF 50% with diffuse hypokinesis, normal RV, mild AS and mild MR.    He was lost to followup in this office again for over a year.  In 7/21, he was admitted for a couple of days with CHF exacerbation and was treated with IV Lasix.  Echo was done in 7/21, EF back down to 25-30%, global hypokinesis, severely decreased RV systolic function with severe RV enlargement, at least moderate aortic stenosis.  He was in NSR.    He returns today for followup of CHF.  He is in NSR today.  Main complaint is right knee pain.  He needs an eventual partial knee replacement.  He fatigues easily but denies dyspnea walking on flat ground.  He will get dyspnea walking up stairs or carrying a load.  He peripheral edema.  No orthopnea/PND.    ECG (personally reviewed): NSR, normal with QTc 460 msec.   Labs (12/19): K  4.2, creatinine 1.35, LFTs normal Las (7/21): LDL 198, K 3.7, creatinine 1.75 Labs (8/21): K 4, creatinine 1.48, BNP 593  SH: No tobacco or drug use. Rare ETOH use. Lives at home with his wife in [place]. 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  FH: No family hx of CAD or CHF.   Review of systems complete and found to be negative unless listed in HPI.   PMH: 1. CKD: Stage 3.  2. Atrial fibrillation: Paroxysmal, poorly tolerated with history of tachycardia-mediated CMP.   - DCCV x 2 in 11/19 and once in 12/19.  3. Type II diabetes.  4. HTN 5. Hyperlipidemia.  6. CAD: DES to mid RCA in 2017.   - LHC (11/19): 50% mLAD, 50% dLAD, 80-85% proximal LCx stenosis, OM1 small vessel with 90% stenois. PCI to proximal LCx.  7. Chronic systolic CHF: Suspect this was primarily tachy-cardia mediated.  - Echo (11/19): EF 35-40%, moderate to severely decreased RV systolic function.  - TEE (12/19): EF 40%, diffuse hypokinesis, normal RV size and systolic function.  - Echo (3/20): EF 50%, normal RV, mild MR, mild AS with mean gradient 14 mmHg. - Echo (7/21): EF 25-30%, global hypokinesis, severely decreased RV systolic function with severe RV enlargement, at least moderate aortic stenosis.   8. Aortic stenosis: Mild by 3/20 echo. 7/21 echo with  at least moderate AS.    Current Outpatient Medications  Medication Sig Dispense Refill  . acetaminophen (TYLENOL) 500 MG tablet Take 500 mg by mouth every 8 (eight) hours as needed (for pain or headaches).     Marland Kitchen amiodarone (PACERONE) 200 MG tablet Take 1 tablet (200 mg total) by mouth daily. 30 tablet 0  . atorvastatin (LIPITOR) 80 MG tablet Take 1 tablet (80 mg total) by mouth daily. 30 tablet 0  . cholecalciferol (VITAMIN D) 1000 units tablet Take 1 tablet (1,000 Units total) by mouth daily. 30 tablet 0  . ELIQUIS 5 MG TABS tablet TAKE 1 TABLET(5 MG) BY MOUTH TWICE DAILY 60 tablet 0  . Fluticasone-Salmeterol (ADVAIR DISKUS) 100-50 MCG/DOSE AEPB Inhale 1 puff into the  lungs 2 (two) times daily. 1 each 0  . glimepiride (AMARYL) 1 MG tablet TAKE 1 TABLET(1 MG) BY MOUTH TWICE DAILY 180 tablet 2  . metFORMIN (GLUCOPHAGE) 1000 MG tablet TAKE 1 TABLET BY MOUTH TWICE DAILY WITH A MEAL. 180 tablet 2  . nitroGLYCERIN (NITROSTAT) 0.4 MG SL tablet Place 1 tablet (0.4 mg total) under the tongue every 5 (five) minutes as needed for chest pain. 30 tablet 0  . ranolazine (RANEXA) 500 MG 12 hr tablet TAKE 1 TABLET(500 MG) BY MOUTH TWICE DAILY 60 tablet 01  . torsemide (DEMADEX) 20 MG tablet Take 2 tablets (40 mg total) by mouth daily. 90 tablet 3  . carvedilol (COREG) 12.5 MG tablet TAKE 1 AND 1/2 TABLETS BY MOUTH TWICE DAILY WITH A MEAL 90 tablet 0  . sacubitril-valsartan (ENTRESTO) 24-26 MG Take 1 tablet by mouth 2 (two) times daily. 60 tablet 5   No current facility-administered medications for this encounter.    No Known Allergies   Vitals:   02/16/20 1003  BP: (!) 160/80  Pulse: 88  SpO2: 96%  Weight: 127.7 kg (281 lb 9.6 oz)  Height: 6' (1.829 m)   Wt Readings from Last 3 Encounters:  02/16/20 127.7 kg (281 lb 9.6 oz)  12/12/19 (!) 130.5 kg (287 lb 9.6 oz)  12/09/19 (!) 131.7 kg (290 lb 6.4 oz)   PHYSICAL EXAM: General: NAD Neck: JVP 8, no thyromegaly or thyroid nodule.  Lungs: Clear to auscultation bilaterally with normal respiratory effort. CV: Nondisplaced PMI.  Heart regular S1/S2, no S3/S4, no murmur.  1+ edema 1/2 to knees bilaterally.  No carotid bruit.  Normal pedal pulses.  Abdomen: Soft, nontender, no hepatosplenomegaly, mild distention.  Skin: Intact without lesions or rashes.  Neurologic: Alert and oriented x 3.  Psych: Normal affect. Extremities: No clubbing or cyanosis.  HEENT: Normal.   ASSESSMENT & PLAN:  1. Chronic systolic CHF: Prior ischemic cardiomyopathy, improved after PCI in 2017. Echo 03/2018 with EF back down to 35-40% and moderate-severe RV dysfunction. This was in the setting of atrial fibrillation with RVR. EF 40% on  TEE 04/2018.  Echo was in 3/20, showing EF up to 50%.  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  More recently, echo in 7/21 with EF 25-30%, severe RV dysfunction.  Cause of fall in EF is uncertain.  He has not been in atrial fibrillation recently.  ?Progressive coronary disease.   NYHA class II symptoms, mild volume overload on exam.  - Continue torsemide 40 mg daily.   - I will add Entresto 24/26 bid, this will allow some degree of increased diuresis.  BMET today and in 10 days.  -Continue Coreg 18.75 mg bid.  - With  symptomatic fall in EF, I will arrange for right and left heart cath to assess filling pressures and cardiac output and to check for progressive coronary disease.  He will need to hold Eliquis the day before and day of procedure. We discussed risks/benefits and he agrees to procedure.  2. Atrial fibrillation: As above, concerned for possible tachy-mediated CMP. Had successful DC-CV on 03/29/18 but then back in atrial fibrillation.Ranolazine added, but failed DCCV again 04/02/18. DCCV 04/19/18 was successful and he remains in NSR on amiodarone + ranolazine. - Currently on ranolazine 500 mg BID, QTc acceptable on today's ECG.  - Continue amio 200 mg daily.  Check LFTs/TSH today.  He will need a regular eye exam with amiodarone use.  - Continue Eliquis 5 mg BID. CBC today.  - He opted against atrial fibrillation ablation in the past.  3. CKD: Stage 3 . BMET today.  4. CAD: History of DES to RCA in 9/17. Underwent LHC 04/03/18 with PCI to prox left circ. No chest pain but worsening dyspnea and fall in EF.  - He is on Eliquis so no ASA.   - Continue atorvastatin 80 daily, check lipids today. LDL was very high in 7/21 but he was not taking his statin.  5. DM2: Will get him on SGLT2 inhibitor in the future.  6. Aortic stenosis: At last moderate on 7/21 echo, concern for possible low gradient severe AS.   - I will assess AoV with TEE on the day of cath. We  discussed risks/benefits and he agrees to procedure.   Followup in 1 month.      [name], MD 02/16/20

## 2020-02-17 ENCOUNTER — Encounter (HOSPITAL_COMMUNITY): Payer: Self-pay

## 2020-02-17 ENCOUNTER — Telehealth (HOSPITAL_COMMUNITY): Payer: Self-pay

## 2020-02-17 MED ORDER — ATORVASTATIN CALCIUM 80 MG PO TABS: 80.0000 mg | ORAL_TABLET | Freq: Every day | ORAL | 6 refills | Status: DC

## 2020-02-17 NOTE — Telephone Encounter (Signed)
Spoke with patient he is taking crestor however been off one and half weeks as he ran out.  Per dr Shirlee Latch this is not significant amount of time to see spike in LDL.  Pt offered recommendations for Repatha however declines.  Dr Shirlee Latch aware, pt encouraged to take lipitor daily and manage diet accordingly. Refill sent in for lipitor

## 2020-02-17 NOTE — Telephone Encounter (Signed)
-----   Message from Laurey Morale, MD sent at 02/16/2020  4:38 PM EDT ----- I don't think he is taking his atorvastatin.  LDL is markedly high.  If he is not taking atorvastatin, needs to start Crestor 40 mg daily. If he really is taking atorvastatin, needs lipid clinic referral for Repatha.

## 2020-02-17 NOTE — Addendum Note (Signed)
Encounter addended by: Laurey Morale, MD on: 02/17/2020 12:04 AM  Actions taken: Clinical Note Signed

## 2020-02-20 DIAGNOSIS — Z23 Encounter for immunization: Secondary | ICD-10-CM | POA: Diagnosis not present

## 2020-02-24 ENCOUNTER — Other Ambulatory Visit: Payer: Self-pay

## 2020-02-24 ENCOUNTER — Encounter: Payer: Self-pay | Admitting: Internal Medicine

## 2020-02-24 ENCOUNTER — Ambulatory Visit (INDEPENDENT_AMBULATORY_CARE_PROVIDER_SITE_OTHER): Payer: Medicare Other | Admitting: Internal Medicine

## 2020-02-24 VITALS — BP 156/84 | HR 84 | Ht 72.0 in | Wt 285.0 lb

## 2020-02-24 DIAGNOSIS — E1142 Type 2 diabetes mellitus with diabetic polyneuropathy: Secondary | ICD-10-CM

## 2020-02-24 DIAGNOSIS — E1159 Type 2 diabetes mellitus with other circulatory complications: Secondary | ICD-10-CM | POA: Diagnosis not present

## 2020-02-24 DIAGNOSIS — N1832 Chronic kidney disease, stage 3b: Secondary | ICD-10-CM | POA: Diagnosis not present

## 2020-02-24 DIAGNOSIS — E119 Type 2 diabetes mellitus without complications: Secondary | ICD-10-CM | POA: Insufficient documentation

## 2020-02-24 DIAGNOSIS — E1122 Type 2 diabetes mellitus with diabetic chronic kidney disease: Secondary | ICD-10-CM | POA: Diagnosis not present

## 2020-02-24 LAB — POCT GLYCOSYLATED HEMOGLOBIN (HGB A1C): Hemoglobin A1C: 6.8 % — AB (ref 4.0–5.6)

## 2020-02-24 MED ORDER — METFORMIN HCL 1000 MG PO TABS
1000.0000 mg | ORAL_TABLET | Freq: Every day | ORAL | 3 refills | Status: DC
Start: 2020-02-24 — End: 2020-03-02

## 2020-02-24 MED ORDER — GLIPIZIDE 5 MG PO TABS: 5.0000 mg | ORAL_TABLET | Freq: Every day | ORAL | 3 refills | Status: DC

## 2020-02-24 NOTE — Patient Instructions (Addendum)
-   Stop Glimepiride  - Start Glipizide 5 mg, 1 tablet before Breakfast  - Decrease Metformin 1000 , 1 tablet daily      HOW TO TREAT LOW BLOOD SUGARS (Blood sugar LESS THAN 70 MG/DL)  Please follow the RULE OF 15 for the treatment of hypoglycemia treatment (when your (blood sugars are less than 70 mg/dL)    STEP 1: Take 15 grams of carbohydrates when your blood sugar is low, which includes:   3-4 GLUCOSE TABS  OR  3-4 OZ OF JUICE OR REGULAR SODA OR  ONE TUBE OF GLUCOSE GEL     STEP 2: RECHECK blood sugar in 15 MINUTES STEP 3: If your blood sugar is still low at the 15 minute recheck --> then, go back to STEP 1 and treat AGAIN with another 15 grams of carbohydrates.

## 2020-02-24 NOTE — Progress Notes (Signed)
Name: David Huynh  Age/ Sex: 79 y.o., male   MRN/ DOB: [id number], 1941-01-26     PCP: Bradd Canary, MD   Reason for Endocrinology Evaluation: Type 2 Diabetes Mellitus  Initial Endocrine Consultative Visit: 02/24/2020    PATIENT IDENTIFIER: David Huynh is a 79 y.o. male with a past medical history of T2DM, A. Fib, HTN and dyslipidemia. The patient has followed with Endocrinology clinic since 02/24/2020 for consultative assistance with management of his diabetes.  DIABETIC HISTORY:  David Huynh was diagnosed with T2DM many years ago. He has not been on insulin in the past. His hemoglobin A1c has ranged from 7.3% in 2012, peaking at 9.9% in 2016.   SUBJECTIVE:    Today (02/24/2020): David Huynh is here for a follow up on diabetes care.  He checks his blood sugars 1 times daily. The patient has not had hypoglycemic episodes since the last clinic visit.     HOME DIABETES REGIMEN:  Metformin 1000 mg BID Glimepiride 1 mg BID     Statin: yes ACE-I/ARB: yes    METER DOWNLOAD SUMMARY: Did not bring  Fasting 120's     DIABETIC COMPLICATIONS: Microvascular complications:   CKD III  Denies: retinopathy, neuropathy   Last Eye Exam: Completed 03/2019  Macrovascular complications:   CAD ( S/P stent ) /CHF  Denies:  CVA, PVD   HISTORY:  Past Medical History:  Past Medical History:  Diagnosis Date  . Allergy    seasonal  . Asthma    mild, intermittent  . Atrial fibrillation with rapid ventricular response (HCC) 03/25/2018  . CAD, multiple vessel    Three-vessel disease involving mid RCA 85% (PCI with DES), OM 2 80% in branch and ostD1 ~90%. Only the mid RCA with PCI target.   . Carotid bruit 08/05/2010   Bruit noted on left   . Chronic combined systolic and diastolic CHF (congestive heart failure) (HCC)   . CKD (chronic kidney disease), stage III (HCC)   . Diabetes mellitus    type 2  . Fracture of multiple ribs 02/12/2015  . Hemothorax on  right 01/23/2015  . Hyperlipidemia   . Hypertension   . Hypocalcemia 02/12/2015  . Lactic acidosis 03/25/2018  . Liver function study, abnormal 04/26/2011  . Multiple rib fractures   . Obesity   . Persistent atrial fibrillation [facility])    Past Surgical History:  Past Surgical History:  Procedure Laterality Date  . APPENDECTOMY  1975  . CARDIAC CATHETERIZATION N/A 02/17/2016   Procedure: Left Heart Cath and Coronary Angiography;  Surgeon: Marykay Lex, MD;  Location: [facility] INVASIVE CV LAB;  Service: Cardiovascular: mRCA 85% (PCI), branch of OM2 80%,  ostD1 90%; EF 35-45%  . CARDIAC CATHETERIZATION N/A 02/17/2016   Procedure: Coronary Stent Intervention;  Surgeon: Marykay Lex, MD;  Location: [facility] INVASIVE CV LAB;  Service: Cardiovascular: mRCA PCI : STENT PROMUS PREM MR 3.5X28 DES  . CARDIOVERSION N/A 03/29/2018   Procedure: CARDIOVERSION;  Surgeon: Laurey Morale, MD;  Location: [facility] ENDOSCOPY;  Service: Cardiovascular;  Laterality: N/A;  . CARDIOVERSION N/A 04/02/2018   Procedure: CARDIOVERSION;  Surgeon: Laurey Morale, MD;  Location: [facility] ENDOSCOPY;  Service: Cardiovascular;  Laterality: N/A;  . CARDIOVERSION N/A 04/19/2018   Procedure: CARDIOVERSION;  Surgeon: Laurey Morale, MD;  Location: [facility] ENDOSCOPY;  Service: Cardiovascular;  Laterality: N/A;  . CATARACT EXTRACTION  2010   b/l  . CORONARY STENT INTERVENTION N/A 04/03/2018   Procedure: CORONARY STENT INTERVENTION;  Surgeon: Lennette Bihari, MD;  Location: [facility] INVASIVE CV LAB;  Service: Cardiovascular;  Laterality: N/A;  . RETINAL LASER PROCEDURE  1985  . RIGHT/LEFT HEART CATH AND CORONARY ANGIOGRAPHY N/A 04/03/2018   Procedure: RIGHT/LEFT HEART CATH AND CORONARY ANGIOGRAPHY;  Surgeon: Laurey Morale, MD;  Location: [facility] INVASIVE CV LAB;  Service: Cardiovascular;  Laterality: N/A;  . TEE WITHOUT CARDIOVERSION N/A 03/29/2018   Procedure: TRANSESOPHAGEAL ECHOCARDIOGRAM (TEE);  Surgeon: Laurey Morale, MD;  Location: [facility] ENDOSCOPY;   Service: Cardiovascular;  Laterality: N/A;  . TEE WITHOUT CARDIOVERSION N/A 04/19/2018   Procedure: TRANSESOPHAGEAL ECHOCARDIOGRAM (TEE);  Surgeon: Laurey Morale, MD;  Location: [facility] ENDOSCOPY;  Service: Cardiovascular;  Laterality: N/A;  . TONSILLECTOMY AND ADENOIDECTOMY  1947  . TRANSTHORACIC ECHOCARDIOGRAM  02/2016   Mild LVH. Moderately reduced EF of 35-40% with diffuse hypokinesis. Indeterminate diastolic function. Likely elevated LA pressures. Mild aortic stenosis (mean gradient 10 mmHg). Mild MR. Mild LA dilation. Mild to moderate PA hypertension with 39 mmHg  . VIDEO ASSISTED THORACOSCOPY (VATS)/THOROCOTOMY Right 01/24/2015   Procedure: VIDEO ASSISTED THORACOSCOPY (VATS) FOR DRAINAGE OF RIGHT HEMOTHORAX;  Surgeon: Alleen Borne, MD;  Location: [facility] OR;  Service: Thoracic;  Laterality: Right;    Social History:  reports that he has never smoked. He has never used smokeless tobacco. He reports current alcohol use. He reports that he does not use drugs. Family History:  Family History  Problem Relation Age of Onset  . CAD Neg Hx      HOME MEDICATIONS: Allergies as of 02/24/2020   No Known Allergies     Medication List       Accurate as of February 24, 2020  3:34 PM. If you have any questions, ask your nurse or doctor.        acetaminophen 500 MG tablet Commonly known as: TYLENOL Take 500 mg by mouth every 8 (eight) hours as needed (for pain or headaches).   amiodarone 200 MG tablet Commonly known as: PACERONE Take 1 tablet (200 mg total) by mouth daily.   atorvastatin 80 MG tablet Commonly known as: LIPITOR Take 1 tablet (80 mg total) by mouth daily.   carvedilol 12.5 MG tablet Commonly known as: COREG TAKE 1 AND 1/2 TABLETS BY MOUTH TWICE DAILY WITH A MEAL What changed: See the new instructions.   cholecalciferol 1000 units tablet Commonly known as: VITAMIN D Take 1 tablet (1,000 Units total) by mouth daily.   Eliquis 5 MG Tabs tablet Generic drug:  apixaban TAKE 1 TABLET(5 MG) BY MOUTH TWICE DAILY What changed: See the new instructions.   Fluticasone-Salmeterol 100-50 MCG/DOSE Aepb Commonly known as: Advair Diskus Inhale 1 puff into the lungs 2 (two) times daily. What changed:   when to take this  reasons to take this   glimepiride 1 MG tablet Commonly known as: AMARYL TAKE 1 TABLET(1 MG) BY MOUTH TWICE DAILY What changed: See the new instructions.   metFORMIN 1000 MG tablet Commonly known as: GLUCOPHAGE TAKE 1 TABLET BY MOUTH TWICE DAILY WITH A MEAL. What changed: See the new instructions.   nitroGLYCERIN 0.4 MG SL tablet Commonly known as: Nitrostat Place 1 tablet (0.4 mg total) under the tongue every 5 (five) minutes as needed for chest pain.   ranolazine 500 MG 12 hr tablet Commonly known as: RANEXA TAKE 1 TABLET(500 MG) BY MOUTH TWICE DAILY What changed: See the new instructions.   sacubitril-valsartan 24-26 MG Commonly known as: ENTRESTO Take 1 tablet by mouth 2 (two) times daily.  torsemide 20 MG tablet Commonly known as: DEMADEX Take 2 tablets (40 mg total) by mouth daily.        OBJECTIVE:   Vital Signs: BP (!) 156/84   Pulse 84   Ht 6' (1.829 m)   Wt 285 lb (129.3 kg)   SpO2 94%   BMI 38.65 kg/m   Wt Readings from Last 3 Encounters:  02/24/20 285 lb (129.3 kg)  02/16/20 281 lb 9.6 oz (127.7 kg)  12/12/19 (!) 287 lb 9.6 oz (130.5 kg)     Exam: General: Pt appears well and is in NAD  Neck: General: Supple without adenopathy. Thyroid: Thyroid size normal.  No goiter or nodules appreciated.  Lungs: Clear with good BS bilat with no rales, rhonchi, or wheezes  Heart: RRR with normal S1 and S2 and no gallops; no murmurs; no rub  Abdomen: Normoactive bowel sounds, soft, nontender, without masses or organomegaly palpable  Extremities: 2 + pretibial edema.   Skin: Normal texture and temperature to palpation.  Neuro: MS is good with appropriate affect, pt is alert and Ox3    DM foot exam:   02/24/2020    The skin of the feet shows extensive plantar and lateralcallous formation with thickened, curved and discolored nails The pedal pulses are 1+ on right and 1+ on left. The sensation is Decreased  to a screening 5.07, 10 gram monofilament bilaterally     DATA REVIEWED:  Lab Results  Component Value Date   HGBA1C 6.8 (A) 02/24/2020   HGBA1C 9.1 (H) 12/07/2019   HGBA1C 8.8 (H) 12/29/2016   Lab Results  Component Value Date   MICROALBUR 17.6 (H) 04/17/2011   LDLCALC 230 (H) 02/16/2020   CREATININE 1.55 (H) 02/16/2020   Lab Results  Component Value Date   MICRALBCREAT 9.1 04/17/2011     Lab Results  Component Value Date   CHOL 327 (H) 02/16/2020   HDL 41 02/16/2020   LDLCALC 230 (H) 02/16/2020   LDLDIRECT 133 (H) 12/25/2016   TRIG 281 (H) 02/16/2020   CHOLHDL 8.0 02/16/2020         ASSESSMENT / PLAN / RECOMMENDATIONS:   1) Type 2 Diabetes Mellitus, Optimally controlled, With CKD III, neuropathic  and macrovascular  complications - Most recent A1c of 6.8 %. Goal A1c < 7.5 %.    - Poorly controlled diabetes in the past due to medication non-adherence , pt has been compliant with medication intake since his discharge from the hospital in 11/2019  - In review of his CKD, I have recommended reducing his metformin by 50% due to a GFR < 45 - I am also going to switch Glimepiride to Glipizide as below    MEDICATIONS: - Stop Glimepiride  - Start Glipizide 5 mg, 1 tablet before Breakfast  - Decrease Metformin 1000 , 1 tablet daily   EDUCATION / INSTRUCTIONS:  BG monitoring instructions: Patient is instructed to check his blood sugars 1 times a day, fasting.  Call [facility] if: BG persistently < 70  . I reviewed the Rule of 15 for the treatment of hypoglycemia in detail with the patient. Literature supplied.    2) Diabetic complications:   Eye: Does not  have known diabetic retinopathy.   Neuro/ Feet: Does have known diabetic  peripheral neuropathy .   Renal: Patient does  have known baseline CKD. He   is on an ACEI/ARB at present.    3)Peripheral neuropathy :   - Pt declined a referral to podiatry at  this time,he has too many office visit. Would like to postpone this until next visit.       F/U in 3 months    Signed electronically by: Lyndle Herrlich, MD  [facility] Endocrinology  [facility] [address] [place] [place], [place] [address] Phone: [phone] FAX: [phone]   CC: Bradd Canary, MD [address] [place] [address] Phone: [phone]  Fax: [phone]  Return to Endocrinology clinic as below: Future Appointments  Date Time Provider Department Center  02/28/2020  9:50 AM [facility] None  03/19/2020 11:20 AM Laurey Morale, MD [facility] None

## 2020-02-28 ENCOUNTER — Other Ambulatory Visit (HOSPITAL_COMMUNITY)
Admission: RE | Admit: 2020-02-28 | Discharge: 2020-02-28 | Disposition: A | Discharge status: Home / Self Care | Payer: Medicare Other | Source: Ambulatory Visit | Attending: Cardiology | Admitting: Cardiology

## 2020-02-28 DIAGNOSIS — Z01812 Encounter for preprocedural laboratory examination: Secondary | ICD-10-CM | POA: Diagnosis not present

## 2020-02-28 DIAGNOSIS — Z20822 Contact with and (suspected) exposure to covid-19: Secondary | ICD-10-CM | POA: Diagnosis not present

## 2020-02-28 LAB — SARS CORONAVIRUS 2 (TAT 6-24 HRS): SARS Coronavirus 2: NEGATIVE

## 2020-03-02 ENCOUNTER — Ambulatory Visit (HOSPITAL_COMMUNITY)
Admission: RE | Admit: 2020-03-02 | Discharge: 2020-03-02 | Disposition: A | Discharge status: Home / Self Care | Payer: Medicare Other | Attending: Cardiology | Admitting: Cardiology

## 2020-03-02 ENCOUNTER — Ambulatory Visit (HOSPITAL_COMMUNITY)
Admission: RE | Disposition: A | Discharge status: Home / Self Care | Payer: Self-pay | Source: Home / Self Care | Attending: Cardiology

## 2020-03-02 ENCOUNTER — Other Ambulatory Visit: Payer: Self-pay

## 2020-03-02 ENCOUNTER — Ambulatory Visit (HOSPITAL_BASED_OUTPATIENT_CLINIC_OR_DEPARTMENT_OTHER)
Admission: RE | Admit: 2020-03-02 | Discharge: 2020-03-02 | Disposition: A | Discharge status: Home / Self Care | Payer: Medicare Other | Source: Ambulatory Visit | Attending: Cardiology | Admitting: Cardiology

## 2020-03-02 ENCOUNTER — Encounter (HOSPITAL_COMMUNITY)
Admission: RE | Disposition: A | Discharge status: Home / Self Care | Payer: Self-pay | Source: Home / Self Care | Attending: Cardiology

## 2020-03-02 ENCOUNTER — Encounter (HOSPITAL_COMMUNITY): Payer: Self-pay | Admitting: Cardiology

## 2020-03-02 DIAGNOSIS — I5043 Acute on chronic combined systolic (congestive) and diastolic (congestive) heart failure: Secondary | ICD-10-CM | POA: Diagnosis present

## 2020-03-02 DIAGNOSIS — I25119 Atherosclerotic heart disease of native coronary artery with unspecified angina pectoris: Secondary | ICD-10-CM | POA: Diagnosis not present

## 2020-03-02 DIAGNOSIS — I25118 Atherosclerotic heart disease of native coronary artery with other forms of angina pectoris: Secondary | ICD-10-CM

## 2020-03-02 DIAGNOSIS — N183 Chronic kidney disease, stage 3 unspecified: Secondary | ICD-10-CM | POA: Diagnosis not present

## 2020-03-02 DIAGNOSIS — Z7984 Long term (current) use of oral hypoglycemic drugs: Secondary | ICD-10-CM | POA: Diagnosis not present

## 2020-03-02 DIAGNOSIS — I350 Nonrheumatic aortic (valve) stenosis: Secondary | ICD-10-CM

## 2020-03-02 DIAGNOSIS — E785 Hyperlipidemia, unspecified: Secondary | ICD-10-CM | POA: Diagnosis present

## 2020-03-02 DIAGNOSIS — Z7951 Long term (current) use of inhaled steroids: Secondary | ICD-10-CM | POA: Insufficient documentation

## 2020-03-02 DIAGNOSIS — J45909 Unspecified asthma, uncomplicated: Secondary | ICD-10-CM | POA: Diagnosis not present

## 2020-03-02 DIAGNOSIS — I255 Ischemic cardiomyopathy: Secondary | ICD-10-CM | POA: Diagnosis not present

## 2020-03-02 DIAGNOSIS — Z79899 Other long term (current) drug therapy: Secondary | ICD-10-CM | POA: Diagnosis not present

## 2020-03-02 DIAGNOSIS — I5022 Chronic systolic (congestive) heart failure: Secondary | ICD-10-CM

## 2020-03-02 DIAGNOSIS — Z955 Presence of coronary angioplasty implant and graft: Secondary | ICD-10-CM | POA: Diagnosis not present

## 2020-03-02 DIAGNOSIS — I480 Paroxysmal atrial fibrillation: Secondary | ICD-10-CM | POA: Insufficient documentation

## 2020-03-02 DIAGNOSIS — E1122 Type 2 diabetes mellitus with diabetic chronic kidney disease: Secondary | ICD-10-CM | POA: Diagnosis not present

## 2020-03-02 DIAGNOSIS — E119 Type 2 diabetes mellitus without complications: Secondary | ICD-10-CM

## 2020-03-02 DIAGNOSIS — I119 Hypertensive heart disease without heart failure: Secondary | ICD-10-CM | POA: Diagnosis present

## 2020-03-02 DIAGNOSIS — E669 Obesity, unspecified: Secondary | ICD-10-CM | POA: Diagnosis present

## 2020-03-02 DIAGNOSIS — I2510 Atherosclerotic heart disease of native coronary artery without angina pectoris: Secondary | ICD-10-CM

## 2020-03-02 DIAGNOSIS — Z95820 Peripheral vascular angioplasty status with implants and grafts: Secondary | ICD-10-CM

## 2020-03-02 DIAGNOSIS — I130 Hypertensive heart and chronic kidney disease with heart failure and stage 1 through stage 4 chronic kidney disease, or unspecified chronic kidney disease: Secondary | ICD-10-CM | POA: Diagnosis not present

## 2020-03-02 DIAGNOSIS — Z7901 Long term (current) use of anticoagulants: Secondary | ICD-10-CM | POA: Insufficient documentation

## 2020-03-02 DIAGNOSIS — I10 Essential (primary) hypertension: Secondary | ICD-10-CM | POA: Diagnosis present

## 2020-03-02 HISTORY — PX: RIGHT/LEFT HEART CATH AND CORONARY ANGIOGRAPHY: CATH118266

## 2020-03-02 HISTORY — PX: TEE WITHOUT CARDIOVERSION: SHX5443

## 2020-03-02 HISTORY — PX: CORONARY STENT INTERVENTION: CATH118234

## 2020-03-02 HISTORY — PX: INTRAVASCULAR ULTRASOUND/IVUS: CATH118244

## 2020-03-02 LAB — POCT I-STAT EG7
Acid-Base Excess: 3 mmol/L — ABNORMAL HIGH (ref 0.0–2.0)
Acid-Base Excess: 4 mmol/L — ABNORMAL HIGH (ref 0.0–2.0)
Bicarbonate: 28.7 mmol/L — ABNORMAL HIGH (ref 20.0–28.0)
Bicarbonate: 29.4 mmol/L — ABNORMAL HIGH (ref 20.0–28.0)
Calcium, Ion: 1.22 mmol/L (ref 1.15–1.40)
Calcium, Ion: 1.22 mmol/L (ref 1.15–1.40)
HCT: 35 % — ABNORMAL LOW (ref 39.0–52.0)
HCT: 35 % — ABNORMAL LOW (ref 39.0–52.0)
Hemoglobin: 11.9 g/dL — ABNORMAL LOW (ref 13.0–17.0)
Hemoglobin: 11.9 g/dL — ABNORMAL LOW (ref 13.0–17.0)
O2 Saturation: 70 %
O2 Saturation: 71 %
Potassium: 3.7 mmol/L (ref 3.5–5.1)
Potassium: 3.7 mmol/L (ref 3.5–5.1)
Sodium: 141 mmol/L (ref 135–145)
Sodium: 141 mmol/L (ref 135–145)
TCO2: 30 mmol/L (ref 22–32)
TCO2: 31 mmol/L (ref 22–32)
pCO2, Ven: 46.1 mmHg (ref 44.0–60.0)
pCO2, Ven: 46.5 mmHg (ref 44.0–60.0)
pH, Ven: 7.398 (ref 7.250–7.430)
pH, Ven: 7.412 (ref 7.250–7.430)
pO2, Ven: 37 mmHg (ref 32.0–45.0)
pO2, Ven: 37 mmHg (ref 32.0–45.0)

## 2020-03-02 LAB — POCT ACTIVATED CLOTTING TIME
Activated Clotting Time: 274 seconds
Activated Clotting Time: 296 seconds

## 2020-03-02 LAB — BASIC METABOLIC PANEL
Anion gap: 11 (ref 5–15)
BUN: 18 mg/dL (ref 8–23)
CO2: 26 mmol/L (ref 22–32)
Calcium: 9.3 mg/dL (ref 8.9–10.3)
Chloride: 102 mmol/L (ref 98–111)
Creatinine, Ser: 1.28 mg/dL — ABNORMAL HIGH (ref 0.61–1.24)
GFR, Estimated: 53 mL/min — ABNORMAL LOW (ref >60)
Glucose, Bld: 210 mg/dL — ABNORMAL HIGH (ref 70–99)
Potassium: 3.9 mmol/L (ref 3.5–5.1)
Sodium: 139 mmol/L (ref 135–145)

## 2020-03-02 LAB — GLUCOSE, CAPILLARY: Glucose-Capillary: 171 mg/dL — ABNORMAL HIGH (ref 70–99)

## 2020-03-02 SURGERY — ECHOCARDIOGRAM, TRANSESOPHAGEAL
Anesthesia: Moderate Sedation

## 2020-03-02 SURGERY — RIGHT/LEFT HEART CATH AND CORONARY ANGIOGRAPHY
Anesthesia: LOCAL

## 2020-03-02 MED ORDER — FENTANYL CITRATE (PF) 100 MCG/2ML IJ SOLN
INTRAMUSCULAR | Status: AC
  Filled 2020-03-02: qty 2

## 2020-03-02 MED ORDER — ONDANSETRON HCL 4 MG/2ML IJ SOLN: 4.0000 mg | Freq: Four times a day (QID) | INTRAMUSCULAR | Status: DC | PRN

## 2020-03-02 MED ORDER — LIDOCAINE HCL (PF) 1 % IJ SOLN
INTRAMUSCULAR | Status: AC
  Filled 2020-03-02: qty 30

## 2020-03-02 MED ORDER — MIDAZOLAM HCL (PF) 5 MG/ML IJ SOLN
INTRAMUSCULAR | Status: AC
  Filled 2020-03-02: qty 2

## 2020-03-02 MED ORDER — BUTAMBEN-TETRACAINE-BENZOCAINE 2-2-14 % EX AERO
INHALATION_SPRAY | CUTANEOUS | Status: DC | PRN
  Administered 2020-03-02: 2 via TOPICAL

## 2020-03-02 MED ORDER — FENTANYL CITRATE (PF) 100 MCG/2ML IJ SOLN
INTRAMUSCULAR | Status: DC | PRN
  Administered 2020-03-02 (×3): 25 ug via INTRAVENOUS

## 2020-03-02 MED ORDER — CLOPIDOGREL BISULFATE 300 MG PO TABS
ORAL_TABLET | ORAL | Status: DC | PRN
  Administered 2020-03-02: 600 mg via ORAL

## 2020-03-02 MED ORDER — SODIUM CHLORIDE 0.9 % IV SOLN: 250.0000 mL | INTRAVENOUS | Status: DC | PRN

## 2020-03-02 MED ORDER — SODIUM CHLORIDE 0.9% FLUSH: 3.0000 mL | INTRAVENOUS | Status: DC | PRN

## 2020-03-02 MED ORDER — DIPHENHYDRAMINE HCL 50 MG/ML IJ SOLN
INTRAMUSCULAR | Status: AC
  Filled 2020-03-02: qty 1

## 2020-03-02 MED ORDER — HEPARIN SODIUM (PORCINE) 1000 UNIT/ML IJ SOLN
INTRAMUSCULAR | Status: AC
  Filled 2020-03-02: qty 1

## 2020-03-02 MED ORDER — MIDAZOLAM HCL 2 MG/2ML IJ SOLN
INTRAMUSCULAR | Status: DC | PRN
  Administered 2020-03-02: 1 mg via INTRAVENOUS

## 2020-03-02 MED ORDER — SODIUM CHLORIDE 0.9 % IV SOLN: INTRAVENOUS | Status: DC

## 2020-03-02 MED ORDER — MIDAZOLAM HCL 2 MG/2ML IJ SOLN
INTRAMUSCULAR | Status: AC
  Filled 2020-03-02: qty 2

## 2020-03-02 MED ORDER — CLOPIDOGREL BISULFATE 75 MG PO TABS: 75.0000 mg | ORAL_TABLET | Freq: Every day | ORAL | Status: DC

## 2020-03-02 MED ORDER — HEPARIN (PORCINE) IN NACL 1000-0.9 UT/500ML-% IV SOLN
INTRAVENOUS | Status: AC
  Filled 2020-03-02: qty 1000

## 2020-03-02 MED ORDER — LABETALOL HCL 5 MG/ML IV SOLN: 10.0000 mg | INTRAVENOUS | Status: DC | PRN

## 2020-03-02 MED ORDER — SODIUM CHLORIDE 0.9 % IV SOLN: INTRAVENOUS | Status: AC

## 2020-03-02 MED ORDER — FAMOTIDINE IN NACL 20-0.9 MG/50ML-% IV SOLN
INTRAVENOUS | Status: AC | PRN
  Administered 2020-03-02: 20 mg via INTRAVENOUS

## 2020-03-02 MED ORDER — HEPARIN (PORCINE) IN NACL 2000-0.9 UNIT/L-% IV SOLN
INTRAVENOUS | Status: AC
  Filled 2020-03-02: qty 1000

## 2020-03-02 MED ORDER — CLOPIDOGREL BISULFATE 300 MG PO TABS
ORAL_TABLET | ORAL | Status: AC
  Filled 2020-03-02: qty 2

## 2020-03-02 MED ORDER — HEPARIN SODIUM (PORCINE) 1000 UNIT/ML IJ SOLN
INTRAMUSCULAR | Status: DC | PRN
  Administered 2020-03-02: 5000 [IU] via INTRAVENOUS

## 2020-03-02 MED ORDER — SODIUM CHLORIDE 0.9% FLUSH: 3.0000 mL | Freq: Two times a day (BID) | INTRAVENOUS | Status: DC

## 2020-03-02 MED ORDER — NITROGLYCERIN 1 MG/10 ML FOR IR/CATH LAB
INTRA_ARTERIAL | Status: DC | PRN
  Administered 2020-03-02: 200 ug via INTRACORONARY

## 2020-03-02 MED ORDER — ACETAMINOPHEN 325 MG PO TABS: 650.0000 mg | ORAL_TABLET | ORAL | Status: DC | PRN

## 2020-03-02 MED ORDER — HEPARIN (PORCINE) IN NACL 1000-0.9 UT/500ML-% IV SOLN
INTRAVENOUS | Status: DC | PRN
  Administered 2020-03-02 (×3): 500 mL

## 2020-03-02 MED ORDER — VERAPAMIL HCL 2.5 MG/ML IV SOLN
INTRAVENOUS | Status: AC
  Filled 2020-03-02: qty 2

## 2020-03-02 MED ORDER — IOHEXOL 350 MG/ML SOLN
INTRAVENOUS | Status: DC | PRN
  Administered 2020-03-02: 60 mL

## 2020-03-02 MED ORDER — CLOPIDOGREL BISULFATE 75 MG PO TABS: 75.0000 mg | ORAL_TABLET | Freq: Every day | ORAL | 3 refills | Status: DC

## 2020-03-02 MED ORDER — FENTANYL CITRATE (PF) 100 MCG/2ML IJ SOLN
INTRAMUSCULAR | Status: DC | PRN
  Administered 2020-03-02 (×2): 25 ug via INTRAVENOUS

## 2020-03-02 MED ORDER — ASPIRIN 81 MG PO CHEW
81.0000 mg | CHEWABLE_TABLET | ORAL | Status: AC
  Administered 2020-03-02: 81 mg via ORAL

## 2020-03-02 MED ORDER — ASPIRIN 81 MG PO CHEW: 81.0000 mg | CHEWABLE_TABLET | Freq: Every day | ORAL | Status: DC

## 2020-03-02 MED ORDER — MIDAZOLAM HCL (PF) 10 MG/2ML IJ SOLN
INTRAMUSCULAR | Status: DC | PRN
  Administered 2020-03-02 (×2): 2 mg via INTRAVENOUS

## 2020-03-02 MED ORDER — LIDOCAINE HCL (PF) 1 % IJ SOLN
INTRAMUSCULAR | Status: DC | PRN
  Administered 2020-03-02 (×2): 2 mL

## 2020-03-02 MED ORDER — IOHEXOL 350 MG/ML SOLN
INTRAVENOUS | Status: DC | PRN
  Administered 2020-03-02: 85 mL via INTRA_ARTERIAL

## 2020-03-02 MED ORDER — NITROGLYCERIN 1 MG/10 ML FOR IR/CATH LAB
INTRA_ARTERIAL | Status: AC
  Filled 2020-03-02: qty 10

## 2020-03-02 MED ORDER — HEPARIN (PORCINE) IN NACL 1000-0.9 UT/500ML-% IV SOLN
INTRAVENOUS | Status: AC
  Filled 2020-03-02: qty 500

## 2020-03-02 MED ORDER — FAMOTIDINE IN NACL 20-0.9 MG/50ML-% IV SOLN
INTRAVENOUS | Status: AC
  Filled 2020-03-02: qty 50

## 2020-03-02 MED ORDER — HYDRALAZINE HCL 20 MG/ML IJ SOLN: 10.0000 mg | INTRAMUSCULAR | Status: DC | PRN

## 2020-03-02 MED ORDER — ASPIRIN 81 MG PO CHEW
CHEWABLE_TABLET | ORAL | Status: AC
  Filled 2020-03-02: qty 1

## 2020-03-02 MED ORDER — METFORMIN HCL 1000 MG PO TABS
1000.0000 mg | ORAL_TABLET | Freq: Every day | ORAL | 3 refills | Status: DC
Start: 2020-03-04 — End: 2021-04-27

## 2020-03-02 MED ORDER — MIDAZOLAM HCL 2 MG/2ML IJ SOLN
INTRAMUSCULAR | Status: DC | PRN
  Administered 2020-03-02 (×3): 1 mg via INTRAVENOUS

## 2020-03-02 MED ORDER — FENTANYL CITRATE (PF) 100 MCG/2ML IJ SOLN
INTRAMUSCULAR | Status: DC | PRN
Start: 2020-03-02 — End: 2020-03-02
  Administered 2020-03-02: 25 ug via INTRAVENOUS

## 2020-03-02 MED ORDER — HEPARIN SODIUM (PORCINE) 1000 UNIT/ML IJ SOLN
INTRAMUSCULAR | Status: DC | PRN
  Administered 2020-03-02 (×2): 3000 [IU] via INTRAVENOUS
  Administered 2020-03-02: 9000 [IU] via INTRAVENOUS

## 2020-03-02 MED FILL — CLOPIDOGREL 75 MG TABLET: 75 | 90 days supply | Qty: 90 | Fill #0

## 2020-03-02 SURGICAL SUPPLY — 25 items
BALLN SAPPHIRE 2.0X12 (BALLOONS) ×2
BALLN SAPPHIRE 2.75X20 (BALLOONS) ×2
BALLN ~~LOC~~ EUPHORA RX 3.5X20 (BALLOONS) ×2
BALLOON SAPPHIRE 2.0X12 (BALLOONS) ×1 IMPLANT
BALLOON SAPPHIRE 2.75X20 (BALLOONS) ×1 IMPLANT
BALLOON ~~LOC~~ EUPHORA RX 3.5X20 (BALLOONS) ×1 IMPLANT
CATH 5FR JL3.5 JR4 ANG PIG MP (CATHETERS) ×2 IMPLANT
CATH LAUNCHER 6FR EBU 3.75 (CATHETERS) ×4 IMPLANT
CATH OPTICROSS HD (CATHETERS) ×2 IMPLANT
CATH SWAN DBL LUMAN 5F 110 (CATHETERS) ×2 IMPLANT
DEVICE RAD COMP TR BAND LRG (VASCULAR PRODUCTS) ×2 IMPLANT
GLIDESHEATH SLEND SS 6F .021 (SHEATH) ×4 IMPLANT
GUIDEWIRE .025 260CM (WIRE) ×2 IMPLANT
GUIDEWIRE INQWIRE 1.5J.035X260 (WIRE) ×1 IMPLANT
INQWIRE 1.5J .035X260CM (WIRE) ×2
KIT ENCORE 26 ADVANTAGE (KITS) ×4 IMPLANT
KIT HEART LEFT (KITS) ×2 IMPLANT
KIT HEMO VALVE WATCHDOG (MISCELLANEOUS) ×2 IMPLANT
PACK CARDIAC CATHETERIZATION (CUSTOM PROCEDURE TRAY) ×2 IMPLANT
SLED PULL BACK IVUS (MISCELLANEOUS) ×2 IMPLANT
STENT SYNERGY XD 2.75X48 (Permanent Stent) ×1 IMPLANT
SYNERGY XD 2.75X48 (Permanent Stent) ×2 IMPLANT
TRANSDUCER W/STOPCOCK (MISCELLANEOUS) ×2 IMPLANT
WIRE ASAHI PROWATER 180CM (WIRE) ×2 IMPLANT
WIRE HI TORQ BMW 190CM (WIRE) ×2 IMPLANT

## 2020-03-02 NOTE — Discharge Instructions (Signed)
Hold metformin for 48 hours. Resume on 03/04/2020 pm   Drink plenty of fluids for 48 hours and keep wrist elevated at heart level for 24 hours  Radial Site Care   This sheet gives you information about how to care for yourself after your procedure. Your health care provider may also give you more specific instructions. If you have problems or questions, contact your health care provider. What can I expect after the procedure? After the procedure, it is common to have:  Bruising and tenderness at the catheter insertion area. Follow these instructions at home: Medicines  Take over-the-counter and prescription medicines only as told by your health care provider. Insertion site care 1. Follow instructions from your health care provider about how to take care of your insertion site. Make sure you: ? Wash your hands with soap and water before you change your bandage (dressing). If soap and water are not available, use hand sanitizer. ? Remove your dressing as told by your health care provider. In 24 hours 2. Check your insertion site every day for signs of infection. Check for: ? Redness, swelling, or pain. ? Fluid or blood. ? Pus or a bad smell. ? Warmth. 3. Do not take baths, swim, or use a hot tub until your health care provider approves. 4. You may shower 24-48 hours after the procedure, or as directed by your health care provider. ? Remove the dressing and gently wash the site with plain soap and water. ? Pat the area dry with a clean towel. ? Do not rub the site. That could cause bleeding. 5. Do not apply powder or lotion to the site. Activity   1. For 24 hours after the procedure, or as directed by your health care provider: ? Do not flex or bend the affected arm. ? Do not push or pull heavy objects with the affected arm. ? Do not drive yourself home from the hospital or clinic. You may drive 24 hours after the procedure unless your health care provider tells you not to. ? Do  not operate machinery or power tools. 2. Do not lift anything that is heavier than 5 lb, or the limit that you are told, until your health care provider says that it is safe.  For 7 days 3. Ask your health care provider when it is okay to: ? Return to work or school. ? Resume usual physical activities or sports. ? Resume sexual activity. General instructions  If the catheter site starts to bleed, raise your arm and put firm pressure on the site. If the bleeding does not stop, get help right away. This is a medical emergency.  If you went home on the same day as your procedure, a responsible adult should be with you for the first 24 hours after you arrive home.  Keep all follow-up visits as told by your health care provider. This is important. Contact a health care provider if:  You have a fever.  You have redness, swelling, or yellow drainage around your insertion site. Get help right away if:  You have unusual pain at the radial site.  The catheter insertion area swells very fast.  The insertion area is bleeding, and the bleeding does not stop when you hold steady pressure on the area.  Your arm or hand becomes pale, cool, tingly, or numb. These symptoms may represent a serious problem that is an emergency. Do not wait to see if the symptoms will go away. Get medical help right away. Call  your local emergency services (911 in the [place] Do not drive yourself to the hospital. Summary  After the procedure, it is common to have bruising and tenderness at the site.  Follow instructions from your health care provider about how to take care of your radial site wound. Check the wound every day for signs of infection.  Do not lift anything that is heavier than 10 lb (4.5 kg), or the limit that you are told, until your health care provider says that it is safe. This information is not intended to replace advice given to you by your health care provider. Make sure you discuss any questions  you have with your health care provider. Document Revised: 06/06/2017 Document Reviewed: 06/06/2017 Elsevier Patient Education  2020 [employer].

## 2020-03-02 NOTE — CV Procedure (Signed)
Procedure: TEE  Indication: AS/MR  Sedation: Versed 4 mg IV, Fentanyl 50 mcg IV  Findings: Please see echo section for full report.  Normal LV size with mild LV hypertrophy.  EF 50% with basal inferior hypokinesis and anterior mild hypokinesis.  Normal RV size and systolic function.  Mild left atrial enlargement, no LA appendage thrombus.  Mild right atrial enlargement.  No PFO or ASD by color doppler.  Trivial TR, unable to measure RV-RA gradient.  Trileaflet aortic valve with moderate calcification.  Trivial aortic insufficiency.  Moderate aortic stenosis with mean gradient 22 mmHg and AVA 1.18 cm^2.  Calcified mitral valve with some restriction of the posterior leaflet.  Moderate mitral regurgitation with ERO 0.32 cm^2 by PISA, no systolic flow reversal in the pulmonary vein doppler signal.  Normal caliber thoracic aorta with mild plaque.   Impression:  1. EF 50% (appears improved compared to 7/21).  2. Moderate aortic stenosis.  3. Moderate mitral regurgitation.   David Huynh 03/02/2020 9:35 AM

## 2020-03-02 NOTE — Interval H&P Note (Signed)
History and Physical Interval Note:  03/02/2020 10:17 AM  David Huynh  has presented today for surgery, with the diagnosis of heart failure.  The various methods of treatment have been discussed with the patient and family. After consideration of risks, benefits and other options for treatment, the patient has consented to  Procedure(s): RIGHT/LEFT HEART CATH AND CORONARY ANGIOGRAPHY (N/A) as a surgical intervention.  The patient's history has been reviewed, patient examined, no change in status, stable for surgery.  I have reviewed the patient's chart and labs.  Questions were answered to the patient's satisfaction.     Chimamanda Siegfried [employer]

## 2020-03-02 NOTE — Interval H&P Note (Signed)
History and Physical Interval Note:  03/02/2020 8:50 AM  David Huynh  has presented today for surgery, with the diagnosis of AORTIC STENOSIS AND CONGESTIVE HEART FAILURE.  The various methods of treatment have been discussed with the patient and family. After consideration of risks, benefits and other options for treatment, the patient has consented to  Procedure(s): TRANSESOPHAGEAL ECHOCARDIOGRAM (TEE) (N/A) as a surgical intervention.  The patient's history has been reviewed, patient examined, no change in status, stable for surgery.  I have reviewed the patient's chart and labs.  Questions were answered to the patient's satisfaction.     Suhayla Chisom [employer]

## 2020-03-02 NOTE — Progress Notes (Signed)
Same day PCI reviewed at discharge time. Patient is hemodynamically stable, VSS, radial cath site without complications. Reviewed instructions with patient as outlined by previous APP on AVS - this will be printed for him by nurse. Patient encouraged to call for any concerns. Chadwick Reiswig PA-C

## 2020-03-02 NOTE — Progress Notes (Signed)
  Echocardiogram 2D Echocardiogram has been performed.  Tye Savoy 03/02/2020, 10:04 AM

## 2020-03-02 NOTE — Discharge Summary (Addendum)
Discharge Summary for Same Day PCI   Patient ID: David Huynh MRN: [id number]; DOB: [birth year]  Admit date: 03/02/2020 Discharge date: 03/02/2020  Primary Care Provider: Bradd Canary, MD  Primary Cardiologist: [name], MD  Discharge Diagnoses    Principal Problem:   CAD in native artery Active Problems:   Obesity   Hyperlipidemia with target LDL less than 70   Type 2 diabetes mellitus without complication, without long-term current use of insulin (HCC)   Essential hypertension   Hypertensive heart disease   Acute on chronic combined systolic and diastolic CHF (congestive heart failure) (HCC)   Diagnostic Studies/Procedures    Cardiac Catheterization 03/02/2020:  RIGHT/LEFT HEART CATH AND CORONARY ANGIOGRAPHY  Conclusion  1. Low filling pressures.  2. Preserved cardiac output.  3. Mild aortic stenosis by mean gradient.  4. Extensive coronary disease.  80% proximal LAD stenosis followed by long 70% prox/mid LAD stenosis.  50% stenosis PDA, 80% stenosis superior branch OM2 and 60% stenosis inferior branch of OM2.   Discussed PCI with Dr. Eldridge Dace, will intervene on LAD.  He has recently restarted his statin (LDL markedly high).  He will start Plavix 75 mg daily and will have a short course of ASA 81.  He will resume Eliquis tomorrow morning.     History of Present Illness     David Huynh is a 79 y.o. male with  a history of CAD, chronic systolic CHF, PAF, HTN, HLD, DM, moderate AS, CKD 3, and asthma presented outpatient cath.   Patient had EF 35-40% in 9/17, had cath with DES to RCA then repeat echo with EF up to 55-60% by 3/18. Echo in 3/20 showed EF 50% with diffuse hypokinesis, normal RV, mild AS and mild MR.    Admitted to the hospital 11/2019 with CHF exacerbation. Echo showed EF back down to 25-30%, global hypokinesis, severely decreased RV systolic function with severe RV enlargement, at least moderate aortic stenosis/  Seem by Dr. Shirlee Latch  02/16/2020 noted exertional dyspnea and fatigue. Cardiac catheterization was arranged for further evaluation.  Hospital Course     The patient underwent cardiac cath by Dr. Shirlee Latch as noted above showing preserved cardiac output and 80% proximal LAD stenosis followed by long 70% prox/mid LAD stenosis. The patient underwent IVUS guided PCI with DES placement to mLAD lesions. He also underwent balloon angioplasty and final kissing balloon inflation to 2nd dig reducing 70% stenosis to 40%. Plan for Plavix for 6 months. No ASA as patient on Eliquis for anticoagulation which he will start tomorrow. The patient was seen by cardiac rehab while in short stay. There were no observed complications post cath. Radial cath site was re-evaluated prior to discharge and found to be stable without any complications. Instructions/precautions regarding cath site care were given prior to discharge.  David Huynh was seen by Dr. Eldridge Dace and determined stable for discharge home. Follow up with our office has been arranged. Medications are listed below. Pertinent changes include addition of Plavix.    Cath/PCI Registry Performance & Quality Measures: 1. Aspirin prescribed? - No - on Anticoagulation with Eliquis 2. ADP Receptor Inhibitor (Plavix/Clopidogrel, Brilinta/Ticagrelor or Effient/Prasugrel) prescribed (includes medically managed patients)? - Yes 3. High Intensity Statin (Lipitor 40-80mg  or Crestor 20-40mg ) prescribed? - Yes 4. For EF <40%, was ACEI/ARB prescribed? - Yes 5. For EF <40%, Aldosterone Antagonist (Spironolactone or Eplerenone) prescribed? - No - Reason:  Consider as outpatient  6. Cardiac Rehab Phase II ordered (Included Medically managed Patients)? - Yes  Discharge Vitals Blood pressure (!) 145/81, pulse 70, temperature 99.1 F (37.3 C), temperature source Oral, resp. rate 18, SpO2 95 %.  There were no vitals filed for this visit.  Last Labs & Radiologic Studies    Basic Metabolic  Panel Recent Labs    03/02/20 0737  NA 139  K 3.9  CL 102  CO2 26  GLUCOSE 210*  BUN 18  CREATININE 1.28*  CALCIUM 9.3  _____________  CARDIAC CATHETERIZATION  Result Date: 03/02/2020  Mid LAD lesion is 80% stenosed. Diffuse lesion.  A drug-eluting stent was successfully placed using a SYNERGY XD 2.75X48, postdialted to 3.5 mm. Stent optimized with IVUS.  Post intervention, there is a 0% residual stenosis.  2nd Diag lesion is 70% stenosed.  Balloon angioplasty was performed using a BALLOON SAPPHIRE 2.0X12. Final kissing balloon inflation was performed.  Post intervention, there is a 40% residual stenosis.  Prox LAD lesion is 40% stenosed. Cross sectional area > 8 mm by IVUS.  Continue clopidogrel ideally for 6 months.  Resume Eliquis tomorrow.  Ejection fraction had improved per Dr. Shirlee Latch.  Will give moderate hydration post procedure.   CARDIAC CATHETERIZATION  Result Date: 03/02/2020 1. Low filling pressures. 2. Preserved cardiac output. 3. Mild aortic stenosis by mean gradient. 4. Extensive coronary disease.  80% proximal LAD stenosis followed by long 70% prox/mid LAD stenosis.  50% stenosis PDA, 80% stenosis superior branch OM2 and 60% stenosis inferior branch of OM2. Discussed PCI with Dr. Eldridge Dace, will intervene on LAD.  He has recently restarted his statin (LDL markedly high).  He will start Plavix 75 mg daily and will have a short course of ASA 81.  He will resume Eliquis tomorrow morning.    Disposition   Pt is being discharged home today in good condition.  Follow-up Plans & Appointments     Follow-up Information    Laurey Morale, MD. Go on 03/19/2020.   Specialty: Cardiology Why: @11 :20am for follow up Contact information: [address] [place] [phone]              Discharge Instructions    AMB Referral to Cardiac Rehabilitation - Phase II   Complete by: As directed    Diagnosis: Coronary Stents   After initial evaluation  and assessments completed: Virtual Based Care may be provided alone or in conjunction with Phase 2 Cardiac Rehab based on patient barriers.: Yes       Discharge Medications   Allergies as of 03/02/2020   No Known Allergies     Medication List    TAKE these medications   acetaminophen 500 MG tablet Commonly known as: TYLENOL Take 500 mg by mouth every 8 (eight) hours as needed (for pain or headaches).   amiodarone 200 MG tablet Commonly known as: PACERONE Take 1 tablet (200 mg total) by mouth daily.   atorvastatin 80 MG tablet Commonly known as: LIPITOR Take 1 tablet (80 mg total) by mouth daily.   carvedilol 12.5 MG tablet Commonly known as: COREG TAKE 1 AND 1/2 TABLETS BY MOUTH TWICE DAILY WITH A MEAL What changed: See the new instructions.   cholecalciferol 1000 units tablet Commonly known as: VITAMIN D Take 1 tablet (1,000 Units total) by mouth daily.   clopidogrel 75 MG tablet Commonly known as: Plavix Take 1 tablet (75 mg total) by mouth daily.   Eliquis 5 MG Tabs tablet Generic drug: apixaban TAKE 1 TABLET(5 MG) BY MOUTH TWICE DAILY What changed: See the new instructions.  Fluticasone-Salmeterol 100-50 MCG/DOSE Aepb Commonly known as: Advair Diskus Inhale 1 puff into the lungs 2 (two) times daily. What changed:   when to take this  reasons to take this   glipiZIDE 5 MG tablet Commonly known as: GLUCOTROL Take 1 tablet (5 mg total) by mouth daily before breakfast.   metFORMIN 1000 MG tablet Commonly known as: GLUCOPHAGE Take 1 tablet (1,000 mg total) by mouth daily with breakfast. Start taking on: March 04, 2020 What changed: These instructions start on March 04, 2020. If you are unsure what to do until then, ask your doctor or other care provider.   nitroGLYCERIN 0.4 MG SL tablet Commonly known as: Nitrostat Place 1 tablet (0.4 mg total) under the tongue every 5 (five) minutes as needed for chest pain.   ranolazine 500 MG 12 hr  tablet Commonly known as: RANEXA TAKE 1 TABLET(500 MG) BY MOUTH TWICE DAILY What changed: See the new instructions.   sacubitril-valsartan 24-26 MG Commonly known as: ENTRESTO Take 1 tablet by mouth 2 (two) times daily.   torsemide 20 MG tablet Commonly known as: DEMADEX Take 2 tablets (40 mg total) by mouth daily.          Allergies No Known Allergies  Outstanding Labs/Studies   None  Duration of Discharge Encounter   Greater than 30 minutes including physician time.  SignedSharrell Ku Reid Hope King, PA 03/02/2020, 2:18 PM   I have examined the patient and reviewed assessment and plan and discussed with patient.  Agree with above as stated.  Given age and stable CAD, will use clopidogrel with Eliquis, no aspirin.  Given length of stent, would like to continue clopidogrel for at least 6 months if no bleeding issues. F/u with Dr. Shirlee Latch.   Lance Muss

## 2020-03-02 NOTE — Progress Notes (Signed)
1420-1500 Discussed with pt the importance of plavix with stent. Reviewed NTG use, walking for exercise and gave heart healthy and diabetic diets. Pt stated he eats mainly vegetarian focusing on fruits and vegetables. Discussed CRP 2 and referred to [place]. Pt has home gym and prefers to exercise there. Needs eventual partial knee replacement.  Understanding voiced of ed. Luetta Nutting RN BSN 03/02/2020 3:02 PM  .

## 2020-03-03 ENCOUNTER — Encounter (HOSPITAL_COMMUNITY): Payer: Self-pay | Admitting: Cardiology

## 2020-03-04 MED FILL — Verapamil HCl IV Soln 2.5 MG/ML: INTRAVENOUS | Qty: 2 | Status: AC

## 2020-03-08 ENCOUNTER — Other Ambulatory Visit (HOSPITAL_COMMUNITY): Payer: Self-pay | Admitting: Cardiology

## 2020-03-09 ENCOUNTER — Other Ambulatory Visit (HOSPITAL_COMMUNITY): Payer: Self-pay | Admitting: Internal Medicine

## 2020-03-19 ENCOUNTER — Encounter (HOSPITAL_COMMUNITY): Payer: Self-pay | Admitting: Cardiology

## 2020-03-19 ENCOUNTER — Ambulatory Visit (HOSPITAL_COMMUNITY)
Admission: RE | Admit: 2020-03-19 | Discharge: 2020-03-19 | Disposition: A | Discharge status: Home / Self Care | Payer: Medicare Other | Source: Ambulatory Visit | Attending: Cardiology | Admitting: Cardiology

## 2020-03-19 ENCOUNTER — Other Ambulatory Visit: Payer: Self-pay

## 2020-03-19 VITALS — BP 160/84 | HR 74 | Wt 286.8 lb

## 2020-03-19 DIAGNOSIS — Z955 Presence of coronary angioplasty implant and graft: Secondary | ICD-10-CM | POA: Diagnosis not present

## 2020-03-19 DIAGNOSIS — Z7902 Long term (current) use of antithrombotics/antiplatelets: Secondary | ICD-10-CM | POA: Insufficient documentation

## 2020-03-19 DIAGNOSIS — Z7901 Long term (current) use of anticoagulants: Secondary | ICD-10-CM | POA: Diagnosis not present

## 2020-03-19 DIAGNOSIS — I2510 Atherosclerotic heart disease of native coronary artery without angina pectoris: Secondary | ICD-10-CM | POA: Insufficient documentation

## 2020-03-19 DIAGNOSIS — I130 Hypertensive heart and chronic kidney disease with heart failure and stage 1 through stage 4 chronic kidney disease, or unspecified chronic kidney disease: Secondary | ICD-10-CM | POA: Diagnosis not present

## 2020-03-19 DIAGNOSIS — E785 Hyperlipidemia, unspecified: Secondary | ICD-10-CM | POA: Insufficient documentation

## 2020-03-19 DIAGNOSIS — I480 Paroxysmal atrial fibrillation: Secondary | ICD-10-CM

## 2020-03-19 DIAGNOSIS — Z7984 Long term (current) use of oral hypoglycemic drugs: Secondary | ICD-10-CM | POA: Insufficient documentation

## 2020-03-19 DIAGNOSIS — E1122 Type 2 diabetes mellitus with diabetic chronic kidney disease: Secondary | ICD-10-CM | POA: Diagnosis not present

## 2020-03-19 DIAGNOSIS — N183 Chronic kidney disease, stage 3 unspecified: Secondary | ICD-10-CM | POA: Diagnosis not present

## 2020-03-19 DIAGNOSIS — Z79899 Other long term (current) drug therapy: Secondary | ICD-10-CM | POA: Insufficient documentation

## 2020-03-19 DIAGNOSIS — I080 Rheumatic disorders of both mitral and aortic valves: Secondary | ICD-10-CM | POA: Insufficient documentation

## 2020-03-19 DIAGNOSIS — Z7951 Long term (current) use of inhaled steroids: Secondary | ICD-10-CM | POA: Insufficient documentation

## 2020-03-19 DIAGNOSIS — I5022 Chronic systolic (congestive) heart failure: Secondary | ICD-10-CM | POA: Insufficient documentation

## 2020-03-19 LAB — BASIC METABOLIC PANEL
Anion gap: 10 (ref 5–15)
BUN: 17 mg/dL (ref 8–23)
CO2: 27 mmol/L (ref 22–32)
Calcium: 9 mg/dL (ref 8.9–10.3)
Chloride: 102 mmol/L (ref 98–111)
Creatinine, Ser: 1.27 mg/dL — ABNORMAL HIGH (ref 0.61–1.24)
GFR, Estimated: 58 mL/min — ABNORMAL LOW (ref >60)
Glucose, Bld: 186 mg/dL — ABNORMAL HIGH (ref 70–99)
Potassium: 3.8 mmol/L (ref 3.5–5.1)
Sodium: 139 mmol/L (ref 135–145)

## 2020-03-19 MED ORDER — ENTRESTO 49-51 MG PO TABS
1.0000 | ORAL_TABLET | Freq: Two times a day (BID) | ORAL | 3 refills | Status: DC
Start: 2020-03-19 — End: 2020-08-09

## 2020-03-19 NOTE — Patient Instructions (Addendum)
INCREASE Entresto 49/51mg  (1 tablet) twice daily  Labs done today, your results will be available in MyChart, we will contact you for abnormal readings.  Your physician recommends that you schedule a lab appointment in 10-14 days  Your physician recommends that you schedule a follow-up appointment in: 2 months   If you have any questions or concerns before your next appointment please send [place] a message through [place] or call our office at [phone].    TO LEAVE A MESSAGE FOR THE NURSE SELECT OPTION 2, PLEASE LEAVE A MESSAGE INCLUDING: . YOUR NAME . DATE OF BIRTH . CALL BACK NUMBER . REASON FOR CALL**this is important as we prioritize the call backs  YOU WILL RECEIVE A CALL BACK THE SAME DAY AS LONG AS YOU CALL BEFORE 4:00 PM

## 2020-03-21 NOTE — Progress Notes (Signed)
[facility] Clinic Note   PCP: Bradd Canary, MD  Cardiologist: [name], MD   David Huynh is a 79 y.o. male with a history of CAD, chronic systolic CHF, HTN, HLD, DM, CKD 3, and asthma. Patient had EF 35-40% in 9/17, had cath with DES to RCA then repeat echo with EF up to 55-60% by 3/18.   Admitted 03/2018 with SOB x 3 weeks. No cardiology follow up in >1 year. Found to be in atrial fibrillation with RVR. EF down to 35-40%. Poor diuresis with IV lasix, so HF team consulted. He was started on amiodarone drip. PICC line placed with CVP markedly elevated and Coox marginal. Held off on inotropes with concern for making afib worse. Started on lasix drip. He started eliquis and had TEE/DCCV 03/29/18. Was converted to NSR, but shortly after went back into AF. Ranexa added. Attempted repeat DCCV 04/02/18, but was unsuccessful. EP consulted and recommended repeat LHC prior to repeat DCCV. Had [facility] 04/03/18 with PCI to proximal LCx.  He then had TEE-DCCV on 04/19/18 back to NSR. EF on TEE was 40%.  He saw Dr. Johney Frame to discuss ablation and opted to continue amiodarone.   Echo in 3/20 showed EF 50% with diffuse hypokinesis, normal RV, mild AS and mild MR.    He was lost to followup in this office again for over a year.  In 7/21, he was admitted for a couple of days with CHF exacerbation and was treated with IV Lasix.  Echo was done in 7/21, EF back down to 25-30%, global hypokinesis, severely decreased RV systolic function with severe RV enlargement, at least moderate aortic stenosis.  He was in NSR.    TEE was done in 10/21 for closer evaluation of aortic valve, this showed EF actually up to 50% with moderate AS and moderate MR.  LHC/RHC was done, showing normal filling pressures and significant LAD disease.  The patient is now s/p DES to LAD.   He returns today for followup of CHF and CAD.  He is in NSR today.  Main complaint continues to be right knee pain.  He needs an eventual  partial knee replacement.  BP is elevated today.  He feels better since stent placement, no dyspnea walking up stairs.  No dyspnea walking on flat ground.  No chest pain.  No lightheadedness.  No BRBPR/melena. He has an endocrinologist following his diabetes.   ECG (personally reviewed): NSR, normal (QTc 447 msec)  Labs (12/19): K 4.2, creatinine 1.35, LFTs normal Las (7/21): LDL 198, K 3.7, creatinine 1.75 Labs (8/21): K 4, creatinine 1.48, BNP 593 Labs (10/21): K 3.9, creatinine 1.28, LDL 230 (not taking statin), TSH normal, LFTs normal  SH: No tobacco or drug use. Rare ETOH use. Lives at home with his wife in [place]. 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  FH: No family hx of CAD or CHF.   Review of systems complete and found to be negative unless listed in HPI.   PMH: 1. CKD: Stage 3.  2. Atrial fibrillation: Paroxysmal, poorly tolerated with history of tachycardia-mediated CMP.   - DCCV x 2 in 11/19 and once in 12/19.  3. Type II diabetes.  4. HTN 5. Hyperlipidemia.  6. CAD: DES to mid RCA in 2017.   - LHC (11/19): 50% mLAD, 50% dLAD, 80-85% proximal LCx stenosis, OM1 small vessel with 90% stenois. PCI to proximal LCx.  - LHC (10/21): 80% pLAD, 70% p/mLAD, 50% m/d LAD, 80% superior branch  OM2, 60% inferior branch OM2, 50% mPDA. PCI with DES to LAD.  7. Chronic systolic CHF: Suspect this was primarily tachy-cardia mediated.  - Echo (11/19): EF 35-40%, moderate to severely decreased RV systolic function.  - TEE (12/19): EF 40%, diffuse hypokinesis, normal RV size and systolic function.  - Echo (3/20): EF 50%, normal RV, mild MR, mild AS with mean gradient 14 mmHg. - Echo (7/21): EF 25-30%, global hypokinesis, severely decreased RV systolic function with severe RV enlargement, at least moderate aortic stenosis.   - TEE (10/21): EF 50%, anterior hypokinesis, basal inferior hypokinesis, normal RV, moderate AS with mean gradient 22 mmHg and AVA 1.18 cm^2, moderate MR with ERO 0.32 cm^2.  - RHC  (10/21): mean RA 2, PA 29/4, mean PCWP 6, CI 2.66, easily crossed AoV with mean gradient 15 mmHg.  8. Aortic stenosis: Mild by 3/20 echo. 7/21 echo with at least moderate AS.    Current Outpatient Medications  Medication Sig Dispense Refill  . acetaminophen (TYLENOL) 500 MG tablet Take 500 mg by mouth every 8 (eight) hours as needed (for pain or headaches).     Marland Kitchen amiodarone (PACERONE) 200 MG tablet Take 1 tablet (200 mg total) by mouth daily. 30 tablet 0  . atorvastatin (LIPITOR) 80 MG tablet Take 1 tablet (80 mg total) by mouth daily. 30 tablet 6  . carvedilol (COREG) 12.5 MG tablet TAKE 1 AND 1/2 TABLETS BY MOUTH TWICE DAILY WITH A MEAL (Patient taking differently: Take 18.75 mg by mouth 2 (two) times daily with a meal. ) 90 tablet 0  . cholecalciferol (VITAMIN D) 1000 units tablet Take 1 tablet (1,000 Units total) by mouth daily. 30 tablet 0  . clopidogrel (PLAVIX) 75 MG tablet Take 1 tablet (75 mg total) by mouth daily. 90 tablet 3  . ELIQUIS 5 MG TABS tablet TAKE 1 TABLET(5 MG) BY MOUTH TWICE DAILY 60 tablet 0  . Fluticasone-Salmeterol (ADVAIR DISKUS) 100-50 MCG/DOSE AEPB Inhale 1 puff into the lungs 2 (two) times daily. (Patient taking differently: Inhale 1 puff into the lungs daily as needed (shortness of breath). ) 1 each 0  . glipiZIDE (GLUCOTROL) 5 MG tablet Take 1 tablet (5 mg total) by mouth daily before breakfast. 90 tablet 3  . metFORMIN (GLUCOPHAGE) 1000 MG tablet Take 1 tablet (1,000 mg total) by mouth daily with breakfast. 90 tablet 3  . nitroGLYCERIN (NITROSTAT) 0.4 MG SL tablet Place 1 tablet (0.4 mg total) under the tongue every 5 (five) minutes as needed for chest pain. 30 tablet 0  . ranolazine (RANEXA) 500 MG 12 hr tablet TAKE 1 TABLET(500 MG) BY MOUTH TWICE DAILY 60 tablet 01  . torsemide (DEMADEX) 20 MG tablet Take 2 tablets (40 mg total) by mouth daily. 90 tablet 3  . sacubitril-valsartan (ENTRESTO) 49-51 MG Take 1 tablet by mouth 2 (two) times daily. 60 tablet 3   No  current facility-administered medications for this encounter.    No Known Allergies   Vitals:   03/19/20 1113  BP: (!) 160/84  Pulse: 74  SpO2: 97%  Weight: 130.1 kg (286 lb 12.8 oz)   Wt Readings from Last 3 Encounters:  03/19/20 130.1 kg (286 lb 12.8 oz)  02/24/20 129.3 kg (285 lb)  02/16/20 127.7 kg (281 lb 9.6 oz)   PHYSICAL EXAM: General: NAD Neck: No JVD, no thyromegaly or thyroid nodule.  Lungs: Clear to auscultation bilaterally with normal respiratory effort. CV: Nondisplaced PMI.  Heart regular S1/S2, no S3/S4, no murmur.  Trace ankle  edema.  No carotid bruit.  Normal pedal pulses.  Abdomen: Soft, nontender, no hepatosplenomegaly, no distention.  Skin: Intact without lesions or rashes.  Neurologic: Alert and oriented x 3.  Psych: Normal affect. Extremities: No clubbing or cyanosis.  HEENT: Normal.   ASSESSMENT & PLAN:  1. Chronic systolic CHF: Prior ischemic cardiomyopathy, improved after PCI in 2017. Echo 03/2018 with EF back down to 35-40% and moderate-severe RV dysfunction. This was in the setting of atrial fibrillation with RVR. EF 40% on TEE 04/2018.  Echo was in 3/20, showing EF up to 50%.  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  Echo in 7/21 with EF 25-30%, severe RV dysfunction.  TEE in 10/21, however, showed EF up to 50%.  He had PCI to LAD in 10/21.   NYHA class II symptoms, not volume overloaded.   - Continue torsemide 40 mg daily.   - Increase Entresto to 49/51 bid, BMET today and in 10 days.  -Continue Coreg 18.75 mg bid.  2. Atrial fibrillation: As above, concerned for possible tachy-mediated CMP. Had successful DC-CV on 03/29/18 but then back in atrial fibrillation.Ranolazine added, but failed DCCV again 04/02/18. DCCV 04/19/18 was successful and he remains in NSR on amiodarone + ranolazine. - Currently on ranolazine 500 mg BID, QTc acceptable on today's ECG.  - Continue amio 200 mg daily.  Recent LFTs and TSH normal.   He will need a regular eye exam with amiodarone use.  - Continue Eliquis 5 mg BID.  - He opted against atrial fibrillation ablation in the past.  3. CKD: Stage 3 . BMET today.  4. CAD: History of DES to RCA in 9/17. Underwent LHC 04/03/18 with PCI to prox left circ. LHC in 10/21 with PCI to LAD.   - He is on Eliquis so no ASA.   - Continue Plavix ideally x 1 yr (to 10/22).  - He has restarted atorvastatin 80 daily, check lipids at followup in 2 months.  5. DM2: Will need to get him on SGLT2 inhibitor in the future.  6. Aortic stenosis: Moderate on TEE in 10/21 and easily crossed at cath.  7. Mitral regurgitation: Moderate by TEE in 10/21.   Followup in 2 months.      [name], MD 03/21/20

## 2020-03-24 DIAGNOSIS — M1711 Unilateral primary osteoarthritis, right knee: Secondary | ICD-10-CM | POA: Diagnosis not present

## 2020-03-27 ENCOUNTER — Other Ambulatory Visit (HOSPITAL_COMMUNITY): Payer: Self-pay | Admitting: Cardiology

## 2020-03-30 ENCOUNTER — Ambulatory Visit (HOSPITAL_COMMUNITY)
Admission: RE | Admit: 2020-03-30 | Discharge: 2020-03-30 | Disposition: A | Discharge status: Home / Self Care | Payer: Medicare Other | Source: Ambulatory Visit | Attending: Internal Medicine | Admitting: Internal Medicine

## 2020-03-30 ENCOUNTER — Other Ambulatory Visit: Payer: Self-pay

## 2020-03-30 DIAGNOSIS — I5022 Chronic systolic (congestive) heart failure: Secondary | ICD-10-CM | POA: Diagnosis not present

## 2020-03-30 LAB — BASIC METABOLIC PANEL
Anion gap: 10 (ref 5–15)
BUN: 22 mg/dL (ref 8–23)
CO2: 28 mmol/L (ref 22–32)
Calcium: 9.2 mg/dL (ref 8.9–10.3)
Chloride: 102 mmol/L (ref 98–111)
Creatinine, Ser: 1.34 mg/dL — ABNORMAL HIGH (ref 0.61–1.24)
GFR, Estimated: 54 mL/min — ABNORMAL LOW (ref >60)
Glucose, Bld: 153 mg/dL — ABNORMAL HIGH (ref 70–99)
Potassium: 3.8 mmol/L (ref 3.5–5.1)
Sodium: 140 mmol/L (ref 135–145)

## 2020-03-30 LAB — ECHO TEE
AR max vel: 1.25 cm2
AV Area VTI: 1.28 cm2
AV Area mean vel: 1.23 cm2
AV Mean grad: 19 mmHg
AV Peak grad: 33.5 mmHg
Ao pk vel: 2.89 m/s
MV M vel: 6.1 m/s
MV Peak grad: 148.8 mmHg
Radius: 0.9 cm

## 2020-04-01 ENCOUNTER — Other Ambulatory Visit (HOSPITAL_COMMUNITY): Payer: Self-pay | Admitting: Cardiology

## 2020-04-19 ENCOUNTER — Other Ambulatory Visit (HOSPITAL_COMMUNITY): Payer: Self-pay | Admitting: Cardiology

## 2020-04-28 DIAGNOSIS — M1711 Unilateral primary osteoarthritis, right knee: Secondary | ICD-10-CM | POA: Diagnosis not present

## 2020-05-05 ENCOUNTER — Other Ambulatory Visit (HOSPITAL_COMMUNITY): Payer: Self-pay | Admitting: Cardiology

## 2020-05-15 ENCOUNTER — Other Ambulatory Visit (HOSPITAL_COMMUNITY): Payer: Self-pay | Admitting: Cardiology

## 2020-05-25 ENCOUNTER — Other Ambulatory Visit (HOSPITAL_COMMUNITY): Payer: Self-pay | Admitting: Cardiology

## 2020-05-25 ENCOUNTER — Ambulatory Visit (INDEPENDENT_AMBULATORY_CARE_PROVIDER_SITE_OTHER): Payer: Medicare Other | Admitting: Internal Medicine

## 2020-05-25 ENCOUNTER — Encounter: Payer: Self-pay | Admitting: Internal Medicine

## 2020-05-25 ENCOUNTER — Other Ambulatory Visit: Payer: Self-pay

## 2020-05-25 VITALS — BP 154/84 | HR 90 | Ht 72.0 in | Wt 286.2 lb

## 2020-05-25 DIAGNOSIS — E1142 Type 2 diabetes mellitus with diabetic polyneuropathy: Secondary | ICD-10-CM | POA: Diagnosis not present

## 2020-05-25 LAB — POCT GLYCOSYLATED HEMOGLOBIN (HGB A1C): Hemoglobin A1C: 6.5 % — AB (ref 4.0–5.6)

## 2020-05-25 LAB — GLUCOSE, POCT (MANUAL RESULT ENTRY): POC Glucose: 86 mg/dl (ref 70–99)

## 2020-05-25 NOTE — Progress Notes (Signed)
Name: David Huynh  Age/ Sex: 80 y.o., male   MRN/ DOB: [id number], [birth year]     PCP: Bradd Canary, MD   Reason for Endocrinology Evaluation: Type 2 Diabetes Mellitus  Initial Endocrine Consultative Visit: 02/24/2020    PATIENT IDENTIFIER: David Huynh is a 80 y.o. male with a past medical history of T2DM, A. Fib, HTN and dyslipidemia. The patient has followed with Endocrinology clinic since 02/24/2020 for consultative assistance with management of his diabetes.  DIABETIC HISTORY:  David Huynh was diagnosed with T2DM many years ago. He has not been on insulin in the past. His hemoglobin A1c has ranged from 7.3% in 2012, peaking at 9.9% in 2016.   SUBJECTIVE:    Today (05/25/2020): David Huynh is here for a follow up on diabetes care.  He checks his blood sugars 1 times daily. The patient has not had hypoglycemic episodes since the last clinic visit.      HOME DIABETES REGIMEN:  Metformin 1000 mg daily  Glipizide 5 mg daily     Statin: yes ACE-I/ARB: yes    METER DOWNLOAD SUMMARY: Did not bring  Memory recall 84- 180 mg/dL     DIABETIC COMPLICATIONS: Microvascular complications:   CKD III  Denies: retinopathy, neuropathy   Last Eye Exam: Completed 03/2019  Macrovascular complications:   CAD ( S/P stent ) /CHF  Denies:  CVA, PVD   HISTORY:  Past Medical History:  Past Medical History:  Diagnosis Date  . Allergy    seasonal  . Asthma    mild, intermittent  . Atrial fibrillation with rapid ventricular response (HCC) 03/25/2018  . CAD, multiple vessel    Three-vessel disease involving mid RCA 85% (PCI with DES), OM 2 80% in branch and ostD1 ~90%. Only the mid RCA with PCI target.   . Carotid bruit 08/05/2010   Bruit noted on left   . Chronic combined systolic and diastolic CHF (congestive heart failure) (HCC)   . CKD (chronic kidney disease), stage III (HCC)   . Diabetes mellitus    type 2  . Fracture of multiple ribs 02/12/2015  .  Hemothorax on right 01/23/2015  . Hyperlipidemia   . Hypertension   . Hypocalcemia 02/12/2015  . Lactic acidosis 03/25/2018  . Liver function study, abnormal 04/26/2011  . Multiple rib fractures   . Obesity   . Persistent atrial fibrillation [facility])    Past Surgical History:  Past Surgical History:  Procedure Laterality Date  . APPENDECTOMY  1975  . CARDIAC CATHETERIZATION N/A 02/17/2016   Procedure: Left Heart Cath and Coronary Angiography;  Surgeon: Marykay Lex, MD;  Location: [facility] INVASIVE CV LAB;  Service: Cardiovascular: mRCA 85% (PCI), branch of OM2 80%,  ostD1 90%; EF 35-45%  . CARDIAC CATHETERIZATION N/A 02/17/2016   Procedure: Coronary Stent Intervention;  Surgeon: Marykay Lex, MD;  Location: [facility] INVASIVE CV LAB;  Service: Cardiovascular: mRCA PCI : STENT PROMUS PREM MR 3.5X28 DES  . CARDIOVERSION N/A 03/29/2018   Procedure: CARDIOVERSION;  Surgeon: Laurey Morale, MD;  Location: [facility] ENDOSCOPY;  Service: Cardiovascular;  Laterality: N/A;  . CARDIOVERSION N/A 04/02/2018   Procedure: CARDIOVERSION;  Surgeon: Laurey Morale, MD;  Location: [facility] ENDOSCOPY;  Service: Cardiovascular;  Laterality: N/A;  . CARDIOVERSION N/A 04/19/2018   Procedure: CARDIOVERSION;  Surgeon: Laurey Morale, MD;  Location: [facility] ENDOSCOPY;  Service: Cardiovascular;  Laterality: N/A;  . CATARACT EXTRACTION  2010   b/l  . CORONARY STENT INTERVENTION N/A 04/03/2018  Procedure: CORONARY STENT INTERVENTION;  Surgeon: Lennette Bihari, MD;  Location: [facility] INVASIVE CV LAB;  Service: Cardiovascular;  Laterality: N/A;  . CORONARY STENT INTERVENTION N/A 03/02/2020   Procedure: CORONARY STENT INTERVENTION;  Surgeon: Corky Crafts, MD;  Location: [facility] INVASIVE CV LAB;  Service: Cardiovascular;  Laterality: N/A;  . INTRAVASCULAR ULTRASOUND/IVUS N/A 03/02/2020   Procedure: Intravascular Ultrasound/IVUS;  Surgeon: Corky Crafts, MD;  Location: [facility] INVASIVE CV LAB;  Service: Cardiovascular;  Laterality: N/A;  .  RETINAL LASER PROCEDURE  1985  . RIGHT/LEFT HEART CATH AND CORONARY ANGIOGRAPHY N/A 04/03/2018   Procedure: RIGHT/LEFT HEART CATH AND CORONARY ANGIOGRAPHY;  Surgeon: Laurey Morale, MD;  Location: [facility] INVASIVE CV LAB;  Service: Cardiovascular;  Laterality: N/A;  . RIGHT/LEFT HEART CATH AND CORONARY ANGIOGRAPHY N/A 03/02/2020   Procedure: RIGHT/LEFT HEART CATH AND CORONARY ANGIOGRAPHY;  Surgeon: Laurey Morale, MD;  Location: [facility] INVASIVE CV LAB;  Service: Cardiovascular;  Laterality: N/A;  . TEE WITHOUT CARDIOVERSION N/A 03/29/2018   Procedure: TRANSESOPHAGEAL ECHOCARDIOGRAM (TEE);  Surgeon: Laurey Morale, MD;  Location: [facility] ENDOSCOPY;  Service: Cardiovascular;  Laterality: N/A;  . TEE WITHOUT CARDIOVERSION N/A 04/19/2018   Procedure: TRANSESOPHAGEAL ECHOCARDIOGRAM (TEE);  Surgeon: Laurey Morale, MD;  Location: [facility] ENDOSCOPY;  Service: Cardiovascular;  Laterality: N/A;  . TEE WITHOUT CARDIOVERSION N/A 03/02/2020   Procedure: TRANSESOPHAGEAL ECHOCARDIOGRAM (TEE);  Surgeon: Laurey Morale, MD;  Location: [facility] ENDOSCOPY;  Service: Cardiovascular;  Laterality: N/A;  . TONSILLECTOMY AND ADENOIDECTOMY  1947  . TRANSTHORACIC ECHOCARDIOGRAM  02/2016   Mild LVH. Moderately reduced EF of 35-40% with diffuse hypokinesis. Indeterminate diastolic function. Likely elevated LA pressures. Mild aortic stenosis (mean gradient 10 mmHg). Mild MR. Mild LA dilation. Mild to moderate PA hypertension with 39 mmHg  . VIDEO ASSISTED THORACOSCOPY (VATS)/THOROCOTOMY Right 01/24/2015   Procedure: VIDEO ASSISTED THORACOSCOPY (VATS) FOR DRAINAGE OF RIGHT HEMOTHORAX;  Surgeon: Alleen Borne, MD;  Location: [facility] OR;  Service: Thoracic;  Laterality: Right;    Social History:  reports that he has never smoked. He has never used smokeless tobacco. He reports current alcohol use. He reports that he does not use drugs. Family History:  Family History  Problem Relation Age of Onset  . CAD Neg Hx      HOME  MEDICATIONS: Allergies as of 05/25/2020   No Known Allergies     Medication List       Accurate as of May 25, 2020  3:22 PM. If you have any questions, ask your nurse or doctor.        acetaminophen 500 MG tablet Commonly known as: TYLENOL Take 500 mg by mouth every 8 (eight) hours as needed (for pain or headaches).   amiodarone 200 MG tablet Commonly known as: PACERONE TAKE 1 TABLET(200 MG) BY MOUTH DAILY   atorvastatin 80 MG tablet Commonly known as: LIPITOR Take 1 tablet (80 mg total) by mouth daily.   carvedilol 12.5 MG tablet Commonly known as: COREG Take 1.5 tablets (18.75 mg total) by mouth 2 (two) times daily with a meal.   cholecalciferol 1000 units tablet Commonly known as: VITAMIN D Take 1 tablet (1,000 Units total) by mouth daily.   clopidogrel 75 MG tablet Commonly known as: Plavix Take 1 tablet (75 mg total) by mouth daily.   Eliquis 5 MG Tabs tablet Generic drug: apixaban TAKE 1 TABLET(5 MG) BY MOUTH TWICE DAILY   Entresto 49-51 MG Generic drug: sacubitril-valsartan Take 1 tablet by mouth 2 (two) times daily.  Fluticasone-Salmeterol 100-50 MCG/DOSE Aepb Commonly known as: Advair Diskus Inhale 1 puff into the lungs 2 (two) times daily. What changed:   when to take this  reasons to take this   glipiZIDE 5 MG tablet Commonly known as: GLUCOTROL Take 1 tablet (5 mg total) by mouth daily before breakfast.   metFORMIN 1000 MG tablet Commonly known as: GLUCOPHAGE Take 1 tablet (1,000 mg total) by mouth daily with breakfast. What changed: how much to take   nitroGLYCERIN 0.4 MG SL tablet Commonly known as: Nitrostat Place 1 tablet (0.4 mg total) under the tongue every 5 (five) minutes as needed for chest pain.   ranolazine 500 MG 12 hr tablet Commonly known as: RANEXA TAKE 1 TABLET(500 MG) BY MOUTH TWICE DAILY   torsemide 20 MG tablet Commonly known as: DEMADEX Take 2 tablets (40 mg total) by mouth daily.        OBJECTIVE:    Vital Signs: BP (!) 154/84   Pulse 90   Ht 6' (1.829 m)   Wt 286 lb 4 oz (129.8 kg)   SpO2 97%   BMI 38.82 kg/m   Wt Readings from Last 3 Encounters:  05/25/20 286 lb 4 oz (129.8 kg)  03/19/20 286 lb 12.8 oz (130.1 kg)  02/24/20 285 lb (129.3 kg)     Exam: General: Pt appears well and is in NAD  Lungs: Clear with good BS bilat with no rales, rhonchi, or wheezes  Heart: RRR , + systolic murmur  Extremities: 1 + pretibial edema.   Neuro: MS is good with appropriate affect, pt is alert and Ox3    DM foot exam:  02/24/2020    The skin of the feet shows extensive plantar and lateralcallous formation with thickened, curved and discolored nails The pedal pulses are 1+ on right and 1+ on left. The sensation is Decreased  to a screening 5.07, 10 gram monofilament bilaterally     DATA REVIEWED:  Lab Results  Component Value Date   HGBA1C 6.5 (A) 05/25/2020   HGBA1C 6.8 (A) 02/24/2020   HGBA1C 9.1 (H) 12/07/2019   Lab Results  Component Value Date   MICROALBUR 17.6 (H) 04/17/2011   LDLCALC 230 (H) 02/16/2020   CREATININE 1.34 (H) 03/30/2020   Lab Results  Component Value Date   MICRALBCREAT 9.1 04/17/2011     Lab Results  Component Value Date   CHOL 327 (H) 02/16/2020   HDL 41 02/16/2020   LDLCALC 230 (H) 02/16/2020   LDLDIRECT 133 (H) 12/25/2016   TRIG 281 (H) 02/16/2020   CHOLHDL 8.0 02/16/2020         ASSESSMENT / PLAN / RECOMMENDATIONS:   1) Type 2 Diabetes Mellitus, Optimally controlled, With CKD III, neuropathic  and macrovascular  complications - Most recent A1c of 6.5 %. Goal A1c < 7.5 %.    - We had reduced his Metformin due to a GFR of < 45 but this has improved since.  - A1c at goal no changes will be made today  - His in-office BG was 84 mg/dL, but he has not eaten since yesterday and took his Glipizide this morning. Pt advised to take Glipizide 20-30 minutes before breakfast and not any longer than that to prevent hypoglycemia.     MEDICATIONS: - Continue Glipizide 5 mg, 1 tablet before Breakfast  - Continue Metformin 1000 , 1 tablet daily   EDUCATION / INSTRUCTIONS:  BG monitoring instructions: Patient is instructed to check his blood sugars 1 times a day, fasting.  Call [place] [facility] if: BG persistently < 70  . I reviewed the Rule of 15 for the treatment of hypoglycemia in detail with the patient. Literature supplied.    2) Diabetic complications:   Eye: Does not  have known diabetic retinopathy.   Neuro/ Feet: Does have known diabetic peripheral neuropathy .   Renal: Patient does  have known baseline CKD. He   is on an ACEI/ARB at present.         F/U in 6 months    Signed electronically by: Lyndle Herrlich, MD  [facility] Endocrinology  [facility] [address] [place], [place] [address] Phone: [phone] FAX: [phone]   CC: Bradd Canary, MD [address] [place] [address] Phone: [phone]  Fax: [phone]  Return to Endocrinology clinic as below: Future Appointments  Date Time Provider Department Center  06/03/2020  2:20 PM Laurey Morale, MD [facility] None

## 2020-05-25 NOTE — Patient Instructions (Signed)
-   Continue Glipizide 5 mg, 1 tablet, take it 20-30 minutes before Breakfast  - Continue Metformin 1000 , 1 tablet daily      HOW TO TREAT LOW BLOOD SUGARS (Blood sugar LESS THAN 70 MG/DL)  Please follow the RULE OF 15 for the treatment of hypoglycemia treatment (when your (blood sugars are less than 70 mg/dL)    STEP 1: Take 15 grams of carbohydrates when your blood sugar is low, which includes:   3-4 GLUCOSE TABS  OR  3-4 OZ OF JUICE OR REGULAR SODA OR  ONE TUBE OF GLUCOSE GEL     STEP 2: RECHECK blood sugar in 15 MINUTES STEP 3: If your blood sugar is still low at the 15 minute recheck --> then, go back to STEP 1 and treat AGAIN with another 15 grams of carbohydrates.

## 2020-05-26 DIAGNOSIS — M1711 Unilateral primary osteoarthritis, right knee: Secondary | ICD-10-CM | POA: Diagnosis not present

## 2020-06-03 ENCOUNTER — Ambulatory Visit (HOSPITAL_COMMUNITY)
Admission: RE | Admit: 2020-06-03 | Discharge: 2020-06-03 | Disposition: A | Discharge status: Home / Self Care | Payer: Medicare Other | Source: Ambulatory Visit | Attending: Cardiology | Admitting: Cardiology

## 2020-06-03 ENCOUNTER — Encounter (HOSPITAL_COMMUNITY): Payer: Self-pay | Admitting: Cardiology

## 2020-06-03 ENCOUNTER — Other Ambulatory Visit: Payer: Self-pay

## 2020-06-03 VITALS — BP 120/86 | HR 83 | Wt 294.4 lb

## 2020-06-03 DIAGNOSIS — E785 Hyperlipidemia, unspecified: Secondary | ICD-10-CM | POA: Diagnosis not present

## 2020-06-03 DIAGNOSIS — I2510 Atherosclerotic heart disease of native coronary artery without angina pectoris: Secondary | ICD-10-CM

## 2020-06-03 DIAGNOSIS — I080 Rheumatic disorders of both mitral and aortic valves: Secondary | ICD-10-CM | POA: Insufficient documentation

## 2020-06-03 DIAGNOSIS — I5022 Chronic systolic (congestive) heart failure: Secondary | ICD-10-CM

## 2020-06-03 DIAGNOSIS — I130 Hypertensive heart and chronic kidney disease with heart failure and stage 1 through stage 4 chronic kidney disease, or unspecified chronic kidney disease: Secondary | ICD-10-CM | POA: Insufficient documentation

## 2020-06-03 DIAGNOSIS — Z7951 Long term (current) use of inhaled steroids: Secondary | ICD-10-CM | POA: Diagnosis not present

## 2020-06-03 DIAGNOSIS — Z7902 Long term (current) use of antithrombotics/antiplatelets: Secondary | ICD-10-CM | POA: Insufficient documentation

## 2020-06-03 DIAGNOSIS — I480 Paroxysmal atrial fibrillation: Secondary | ICD-10-CM | POA: Diagnosis not present

## 2020-06-03 DIAGNOSIS — I4891 Unspecified atrial fibrillation: Secondary | ICD-10-CM

## 2020-06-03 DIAGNOSIS — Z79899 Other long term (current) drug therapy: Secondary | ICD-10-CM | POA: Diagnosis not present

## 2020-06-03 DIAGNOSIS — Z7901 Long term (current) use of anticoagulants: Secondary | ICD-10-CM | POA: Diagnosis not present

## 2020-06-03 DIAGNOSIS — Z7984 Long term (current) use of oral hypoglycemic drugs: Secondary | ICD-10-CM | POA: Diagnosis not present

## 2020-06-03 DIAGNOSIS — Z955 Presence of coronary angioplasty implant and graft: Secondary | ICD-10-CM | POA: Insufficient documentation

## 2020-06-03 DIAGNOSIS — N183 Chronic kidney disease, stage 3 unspecified: Secondary | ICD-10-CM | POA: Insufficient documentation

## 2020-06-03 DIAGNOSIS — E1122 Type 2 diabetes mellitus with diabetic chronic kidney disease: Secondary | ICD-10-CM | POA: Insufficient documentation

## 2020-06-03 LAB — CBC
HCT: 38.5 % — ABNORMAL LOW (ref 39.0–52.0)
Hemoglobin: 12.8 g/dL — ABNORMAL LOW (ref 13.0–17.0)
MCH: 31.8 pg (ref 26.0–34.0)
MCHC: 33.2 g/dL (ref 30.0–36.0)
MCV: 95.5 fL (ref 80.0–100.0)
Platelets: 221 10*3/uL (ref 150–400)
RBC: 4.03 MIL/uL — ABNORMAL LOW (ref 4.22–5.81)
RDW: 12.6 % (ref 11.5–15.5)
WBC: 7.9 10*3/uL (ref 4.0–10.5)
nRBC: 0 % (ref 0.0–0.2)

## 2020-06-03 LAB — COMPREHENSIVE METABOLIC PANEL
ALT: 24 U/L (ref 0–44)
AST: 18 U/L (ref 15–41)
Albumin: 3.6 g/dL (ref 3.5–5.0)
Alkaline Phosphatase: 53 U/L (ref 38–126)
Anion gap: 10 (ref 5–15)
BUN: 22 mg/dL (ref 8–23)
CO2: 26 mmol/L (ref 22–32)
Calcium: 9.1 mg/dL (ref 8.9–10.3)
Chloride: 103 mmol/L (ref 98–111)
Creatinine, Ser: 1.25 mg/dL — ABNORMAL HIGH (ref 0.61–1.24)
GFR, Estimated: 59 mL/min — ABNORMAL LOW (ref >60)
Glucose, Bld: 136 mg/dL — ABNORMAL HIGH (ref 70–99)
Potassium: 3.7 mmol/L (ref 3.5–5.1)
Sodium: 139 mmol/L (ref 135–145)
Total Bilirubin: 0.8 mg/dL (ref 0.3–1.2)
Total Protein: 6.5 g/dL (ref 6.5–8.1)

## 2020-06-03 LAB — LIPID PANEL
Cholesterol: 179 mg/dL (ref 0–200)
HDL: 50 mg/dL (ref >40)
LDL Cholesterol: 69 mg/dL (ref 0–99)
Total CHOL/HDL Ratio: 3.6 RATIO
Triglycerides: 298 mg/dL — ABNORMAL HIGH (ref <150)
VLDL: 60 mg/dL — ABNORMAL HIGH (ref 0–40)

## 2020-06-03 LAB — TSH: TSH: 1.186 u[IU]/mL (ref 0.350–4.500)

## 2020-06-03 MED ORDER — TORSEMIDE 20 MG PO TABS: ORAL_TABLET | ORAL | 3 refills | Status: DC

## 2020-06-03 MED ORDER — CLOPIDOGREL BISULFATE 75 MG PO TABS: 75.0000 mg | ORAL_TABLET | Freq: Every day | ORAL | 3 refills | Status: DC

## 2020-06-03 NOTE — Patient Instructions (Signed)
Increase Torsemide to 40 mg (2 tabs) Twice daily FOR 3 DAYS, then take 40 mg in AM and 20 mg in PM  Labs done today, your results will be available in MyChart, we will contact you for abnormal readings.  Your physician recommends that you return for lab work in: 10 days  Your physician recommends that you schedule a follow-up appointment in: 1 month  If you have any questions or concerns before your next appointment please send [place] a message through [place] or call our office at [phone].    TO LEAVE A MESSAGE FOR THE NURSE SELECT OPTION 2, PLEASE LEAVE A MESSAGE INCLUDING: . YOUR NAME . DATE OF BIRTH . CALL BACK NUMBER . REASON FOR CALL**this is important as we prioritize the call backs  YOU WILL RECEIVE A CALL BACK THE SAME DAY AS LONG AS YOU CALL BEFORE 4:00 PM  At the [facility], you and your health needs are our priority. As part of our continuing mission to provide you with exceptional heart care, we have created designated Provider Care Teams. These Care Teams include your primary Cardiologist (physician) and Advanced Practice Providers (APPs- Physician Assistants and Nurse Practitioners) who all work together to provide you with the care you need, when you need it.   You may see any of the following providers on your designated Care Team at your next follow up: Marland Kitchen Dr Arvilla Meres . Dr [name] . Tonye Becket, NP . Robbie Lis, PA . Karle Plumber, PharmD   Please be sure to bring in all your medications bottles to every appointment.

## 2020-06-04 ENCOUNTER — Telehealth (HOSPITAL_COMMUNITY): Payer: Self-pay

## 2020-06-04 DIAGNOSIS — M1711 Unilateral primary osteoarthritis, right knee: Secondary | ICD-10-CM | POA: Diagnosis not present

## 2020-06-04 DIAGNOSIS — I5022 Chronic systolic (congestive) heart failure: Secondary | ICD-10-CM

## 2020-06-04 MED ORDER — ICOSAPENT ETHYL 1 G PO CAPS: 2.0000 g | ORAL_CAPSULE | Freq: Two times a day (BID) | ORAL | 11 refills | Status: DC

## 2020-06-04 NOTE — Telephone Encounter (Signed)
Patient advised and verbalized understanding.lab appt scheduled,lab order entered,new Rx sent into patients pharmacy.    Meds ordered this encounter  Medications   icosapent Ethyl (VASCEPA) 1 g capsule    Sig: Take 2 capsules (2 g total) by mouth 2 (two) times daily.    Dispense:  120 capsule    Refill:  11     Orders Placed This Encounter  Procedures   Lipid Profile    Standing Status:   Future    Standing Expiration Date:   06/04/2021    Order Specific Question:   Release to patient    Answer:   Immediate   Hepatic function panel    Standing Status:   Future    Standing Expiration Date:   06/04/2021    Order Specific Question:   Release to patient    Answer:   Immediate

## 2020-06-04 NOTE — Telephone Encounter (Signed)
-----   Message from Laurey Morale, MD sent at 06/03/2020  4:53 PM EST ----- LDL much better. Triglycerides remain high.  I would like him to start Vascepa 2 g bid for triglycerides.  Lipids in 2 months.

## 2020-06-05 NOTE — Progress Notes (Signed)
Advanced Heart Failure Clinic Note   PCP: Bradd Canary, MD  Cardiologist: [name], MD   David Huynh is a 80 y.o. male with a history of CAD, chronic systolic CHF, HTN, HLD, DM, CKD 3, and asthma. Patient had EF 35-40% in 9/17, had cath with DES to RCA then repeat echo with EF up to 55-60% by 3/18.   Admitted 03/2018 with SOB x 3 weeks. No cardiology follow up in >1 year. Found to be in atrial fibrillation with RVR. EF down to 35-40%. Poor diuresis with IV lasix, so HF team consulted. He was started on amiodarone drip. PICC line placed with CVP markedly elevated and Coox marginal. Held off on inotropes with concern for making afib worse. Started on lasix drip. He started eliquis and had TEE/DCCV 03/29/18. Was converted to NSR, but shortly after went back into AF. Ranexa added. Attempted repeat DCCV 04/02/18, but was unsuccessful. EP consulted and recommended repeat LHC prior to repeat DCCV. Had [facility] 04/03/18 with PCI to proximal LCx.  He then had TEE-DCCV on 04/19/18 back to NSR. EF on TEE was 40%.  He saw Dr. Johney Frame to discuss ablation and opted to continue amiodarone.   Echo in 3/20 showed EF 50% with diffuse hypokinesis, normal RV, mild AS and mild MR.    He was lost to followup in this office again for over a year.  In 7/21, he was admitted for a couple of days with CHF exacerbation and was treated with IV Lasix.  Echo was done in 7/21, EF back down to 25-30%, global hypokinesis, severely decreased RV systolic function with severe RV enlargement, at least moderate aortic stenosis.  He was in NSR.    TEE was done in 10/21 for closer evaluation of aortic valve, this showed EF actually up to 50% with moderate AS and moderate MR.  LHC/RHC was done, showing normal filling pressures and significant LAD disease.  The patient is now s/p DES to LAD.   He returns today for followup of CHF and CAD.  He is in NSR today.  Mildly dyspnea walking a long distance.  No orthopnea/PND.  +Knee pain.   No BRBPR/melena.  Weight is up 8 lbs.    ECG (personally reviewed): NSR, normal (QTc 467 msec)  Labs (12/19): K 4.2, creatinine 1.35, LFTs normal Las (7/21): LDL 198, K 3.7, creatinine 1.75 Labs (8/21): K 4, creatinine 1.48, BNP 593 Labs (10/21): K 3.9, creatinine 1.28, LDL 230 (not taking statin), TSH normal, LFTs normal Labs (11/21): K 3.8, creatinine 1.34  SH: No tobacco or drug use. Rare ETOH use. Lives at home with his wife in [place]. 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  FH: No family hx of CAD or CHF.   Review of systems complete and found to be negative unless listed in HPI.   PMH: 1. CKD: Stage 3.  2. Atrial fibrillation: Paroxysmal, poorly tolerated with history of tachycardia-mediated CMP.   - DCCV x 2 in 11/19 and once in 12/19.  3. Type II diabetes.  4. HTN 5. Hyperlipidemia.  6. CAD: DES to mid RCA in 2017.   - LHC (11/19): 50% mLAD, 50% dLAD, 80-85% proximal LCx stenosis, OM1 small vessel with 90% stenois. PCI to proximal LCx.  - LHC (10/21): 80% pLAD, 70% p/mLAD, 50% m/d LAD, 80% superior branch OM2, 60% inferior branch OM2, 50% mPDA. PCI with DES to LAD.  7. Chronic systolic CHF: Suspect this was primarily tachy-cardia mediated.  - Echo (11/19): EF 35-40%,  moderate to severely decreased RV systolic function.  - TEE (12/19): EF 40%, diffuse hypokinesis, normal RV size and systolic function.  - Echo (3/20): EF 50%, normal RV, mild MR, mild AS with mean gradient 14 mmHg. - Echo (7/21): EF 25-30%, global hypokinesis, severely decreased RV systolic function with severe RV enlargement, at least moderate aortic stenosis.   - TEE (10/21): EF 50%, anterior hypokinesis, basal inferior hypokinesis, normal RV, moderate AS with mean gradient 22 mmHg and AVA 1.18 cm^2, moderate MR with ERO 0.32 cm^2.  - RHC (10/21): mean RA 2, PA 29/4, mean PCWP 6, CI 2.66, easily crossed AoV with mean gradient 15 mmHg.  8. Aortic stenosis: Mild by 3/20 echo. 7/21 echo with at least moderate AS.     Current Outpatient Medications  Medication Sig Dispense Refill  . acetaminophen (TYLENOL) 500 MG tablet Take 500 mg by mouth every 8 (eight) hours as needed (for pain or headaches).     Marland Kitchen amiodarone (PACERONE) 200 MG tablet TAKE 1 TABLET(200 MG) BY MOUTH DAILY 30 tablet 0  . atorvastatin (LIPITOR) 80 MG tablet Take 1 tablet (80 mg total) by mouth daily. 30 tablet 6  . carvedilol (COREG) 12.5 MG tablet Take 1.5 tablets (18.75 mg total) by mouth 2 (two) times daily with a meal. 270 tablet 3  . cholecalciferol (VITAMIN D) 1000 units tablet Take 1 tablet (1,000 Units total) by mouth daily. 30 tablet 0  . ELIQUIS 5 MG TABS tablet TAKE 1 TABLET(5 MG) BY MOUTH TWICE DAILY 60 tablet 11  . Fluticasone-Salmeterol (ADVAIR DISKUS) 100-50 MCG/DOSE AEPB Inhale 1 puff into the lungs 2 (two) times daily. (Patient taking differently: Inhale 1 puff into the lungs daily as needed (shortness of breath).) 1 each 0  . glipiZIDE (GLUCOTROL) 5 MG tablet Take 1 tablet (5 mg total) by mouth daily before breakfast. 90 tablet 3  . metFORMIN (GLUCOPHAGE) 1000 MG tablet Take 1 tablet (1,000 mg total) by mouth daily with breakfast. (Patient taking differently: Take 500 mg by mouth daily with breakfast.) 90 tablet 3  . nitroGLYCERIN (NITROSTAT) 0.4 MG SL tablet Place 1 tablet (0.4 mg total) under the tongue every 5 (five) minutes as needed for chest pain. 30 tablet 0  . ranolazine (RANEXA) 500 MG 12 hr tablet TAKE 1 TABLET(500 MG) BY MOUTH TWICE DAILY 60 tablet 01  . sacubitril-valsartan (ENTRESTO) 49-51 MG Take 1 tablet by mouth 2 (two) times daily. 60 tablet 3  . clopidogrel (PLAVIX) 75 MG tablet Take 1 tablet (75 mg total) by mouth daily. 90 tablet 3  . icosapent Ethyl (VASCEPA) 1 g capsule Take 2 capsules (2 g total) by mouth 2 (two) times daily. 120 capsule 11  . torsemide (DEMADEX) 20 MG tablet Take 2 tablets (40 mg total) by mouth in the morning AND 1 tablet (20 mg total) every evening. 90 tablet 3   No current  facility-administered medications for this encounter.    No Known Allergies   Vitals:   06/03/20 1507  BP: 120/86  Pulse: 83  SpO2: 96%  Weight: 133.5 kg (294 lb 6.4 oz)   Wt Readings from Last 3 Encounters:  06/03/20 133.5 kg (294 lb 6.4 oz)  05/25/20 129.8 kg (286 lb 4 oz)  03/19/20 130.1 kg (286 lb 12.8 oz)   PHYSICAL EXAM: General: NAD Neck: JVP 8-9 cm, no thyromegaly or thyroid nodule.  Lungs: Clear to auscultation bilaterally with normal respiratory effort. CV: Nondisplaced PMI.  Heart regular S1/S2, no S3/S4, no murmur.  1+  ankle edema.  No carotid bruit.  Normal pedal pulses.  Abdomen: Soft, nontender, no hepatosplenomegaly, no distention.  Skin: Intact without lesions or rashes.  Neurologic: Alert and oriented x 3.  Psych: Normal affect. Extremities: No clubbing or cyanosis.  HEENT: Normal.   ASSESSMENT & PLAN:  1. Chronic systolic CHF: Prior ischemic cardiomyopathy, improved after PCI in 2017. Echo 03/2018 with EF back down to 35-40% and moderate-severe RV dysfunction. This was in the setting of atrial fibrillation with RVR. EF 40% on TEE 04/2018.  Echo was in 3/20, showing EF up to 50%.  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  Echo in 7/21 with EF 25-30%, severe RV dysfunction.  TEE in 10/21, however, showed EF up to 50%.  He had PCI to LAD in 10/21.   NYHA class II symptoms, weight is up and he looks volume overloaded.    - Increase torsemide to 40 mg bid x 3 days then 40 qam/20 qpm.  BMET today and again in 10 days.    - Continue Entresto 49/51 bid.  -Continue Coreg 18.75 mg bid.  2. Atrial fibrillation: As above, concerned for possible tachy-mediated CMP. Had successful DC-CV on 03/29/18 but then back in atrial fibrillation.Ranolazine added, but failed DCCV again 04/02/18. DCCV 04/19/18 was successful and he remains in NSR on amiodarone + ranolazine. - Currently on ranolazine 500 mg BID, QTc acceptable on today's ECG.  -  Continue amio 200 mg daily.  Check LFTs and TSH.  He will need a regular eye exam with amiodarone use.  - Continue Eliquis 5 mg BID. Check CBC.  - He opted against atrial fibrillation ablation in the past.  3. CKD: Stage 3 . BMET today.  4. CAD: History of DES to RCA in 9/17. Underwent LHC 04/03/18 with PCI to prox left circ. LHC in 10/21 with PCI to LAD.   - He is on Eliquis so no ASA.   - Continue Plavix ideally x 1 yr (to 10/22).  - He has restarted atorvastatin 80 daily, check lipids today.  5. DM2: Will need to get him on SGLT2 inhibitor in the future.  6. Aortic stenosis: Moderate on TEE in 10/21 and easily crossed at cath.  7. Mitral regurgitation: Moderate by TEE in 10/21.   Followup in with NP/PA in 1 month to reassess volume status.      [name], MD 06/05/20

## 2020-06-10 ENCOUNTER — Other Ambulatory Visit: Payer: Self-pay

## 2020-06-10 ENCOUNTER — Ambulatory Visit (HOSPITAL_COMMUNITY)
Admission: RE | Admit: 2020-06-10 | Discharge: 2020-06-10 | Disposition: A | Discharge status: Home / Self Care | Payer: Medicare Other | Source: Ambulatory Visit | Attending: Cardiology | Admitting: Cardiology

## 2020-06-10 DIAGNOSIS — I5022 Chronic systolic (congestive) heart failure: Secondary | ICD-10-CM | POA: Insufficient documentation

## 2020-06-10 DIAGNOSIS — I4891 Unspecified atrial fibrillation: Secondary | ICD-10-CM | POA: Diagnosis not present

## 2020-06-10 LAB — BASIC METABOLIC PANEL
Anion gap: 12 (ref 5–15)
BUN: 22 mg/dL (ref 8–23)
CO2: 29 mmol/L (ref 22–32)
Calcium: 9.2 mg/dL (ref 8.9–10.3)
Chloride: 101 mmol/L (ref 98–111)
Creatinine, Ser: 1.37 mg/dL — ABNORMAL HIGH (ref 0.61–1.24)
GFR, Estimated: 53 mL/min — ABNORMAL LOW (ref >60)
Glucose, Bld: 126 mg/dL — ABNORMAL HIGH (ref 70–99)
Potassium: 4.5 mmol/L (ref 3.5–5.1)
Sodium: 142 mmol/L (ref 135–145)

## 2020-06-10 LAB — LIPID PANEL
Cholesterol: 196 mg/dL (ref 0–200)
HDL: 58 mg/dL (ref >40)
LDL Cholesterol: 86 mg/dL (ref 0–99)
Total CHOL/HDL Ratio: 3.4 RATIO
Triglycerides: 258 mg/dL — ABNORMAL HIGH (ref <150)
VLDL: 52 mg/dL — ABNORMAL HIGH (ref 0–40)

## 2020-06-10 LAB — HEPATIC FUNCTION PANEL
ALT: 26 U/L (ref 0–44)
AST: 21 U/L (ref 15–41)
Albumin: 3.9 g/dL (ref 3.5–5.0)
Alkaline Phosphatase: 53 U/L (ref 38–126)
Bilirubin, Direct: 0.1 mg/dL (ref 0.0–0.2)
Indirect Bilirubin: 0.7 mg/dL (ref 0.3–0.9)
Total Bilirubin: 0.8 mg/dL (ref 0.3–1.2)
Total Protein: 7.1 g/dL (ref 6.5–8.1)

## 2020-06-11 DIAGNOSIS — M1711 Unilateral primary osteoarthritis, right knee: Secondary | ICD-10-CM | POA: Diagnosis not present

## 2020-06-17 ENCOUNTER — Telehealth (HOSPITAL_COMMUNITY): Payer: Self-pay

## 2020-06-17 DIAGNOSIS — I5022 Chronic systolic (congestive) heart failure: Secondary | ICD-10-CM

## 2020-06-17 MED ORDER — EZETIMIBE 10 MG PO TABS: 10.0000 mg | ORAL_TABLET | Freq: Every day | ORAL | 3 refills | Status: DC

## 2020-06-17 NOTE — Telephone Encounter (Signed)
Patient advised and verbalized understanding. Patient has pending lab appointment at the end of march will recheck levels at that time. Lab orders entered. New Rx sent into patients pharmacy   Orders Placed This Encounter  Procedures  . Lipid Profile    Standing Status:   Future    Standing Expiration Date:   06/17/2021    Order Specific Question:   Release to patient    Answer:   Immediate  . Hepatic function panel    Standing Status:   Future    Standing Expiration Date:   06/17/2021    Order Specific Question:   Release to patient    Answer:   Immediate     Meds ordered this encounter  Medications  . ezetimibe (ZETIA) 10 MG tablet    Sig: Take 1 tablet (10 mg total) by mouth daily.    Dispense:  90 tablet    Refill:  3

## 2020-06-17 NOTE — Telephone Encounter (Signed)
-----   Message from Laurey Morale, MD sent at 06/11/2020  4:41 PM EST ----- Goal LDL < 70 with coronary disease.  Add Zetia 10 mg daily to atorvastatin 80 mg daily with lipids/LFTs in 2 months.

## 2020-07-03 ENCOUNTER — Other Ambulatory Visit: Payer: Self-pay | Admitting: Medical

## 2020-07-05 ENCOUNTER — Encounter (HOSPITAL_COMMUNITY): Payer: Medicare Other

## 2020-07-07 ENCOUNTER — Other Ambulatory Visit (HOSPITAL_COMMUNITY): Payer: Self-pay | Admitting: Cardiology

## 2020-07-08 ENCOUNTER — Other Ambulatory Visit (HOSPITAL_COMMUNITY): Payer: Self-pay

## 2020-07-08 MED ORDER — RANOLAZINE ER 500 MG PO TB12: 500.0000 mg | ORAL_TABLET | Freq: Two times a day (BID) | ORAL | 0 refills | Status: DC

## 2020-07-16 ENCOUNTER — Other Ambulatory Visit (HOSPITAL_COMMUNITY): Payer: Self-pay | Admitting: *Deleted

## 2020-07-20 NOTE — Progress Notes (Signed)
Advanced Heart Failure Clinic Note   PCP: Bradd Canary, MD  Cardiologist: [name], MD   David Huynh is a 79 y.o. male with a history of CAD, chronic systolic CHF, HTN, HLD, DM, CKD 3, and asthma. Patient had EF 35-40% in 9/17, had cath with DES to RCA then repeat echo with EF up to 55-60% by 3/18.   Admitted 03/2018 with SOB x 3 weeks. No cardiology follow up in >1 year. Found to be in atrial fibrillation with RVR. EF down to 35-40%. Poor diuresis with IV lasix, so HF team consulted. He was started on amiodarone drip. PICC line placed with CVP markedly elevated and Coox marginal. Held off on inotropes with concern for making afib worse. Started on lasix drip. He started eliquis and had TEE/DCCV 03/29/18. Was converted to NSR, but shortly after went back into AF. Ranexa added. Attempted repeat DCCV 04/02/18, but was unsuccessful. EP consulted and recommended repeat LHC prior to repeat DCCV. Had [facility] 04/03/18 with PCI to proximal LCx.  He then had TEE-DCCV on 04/19/18 back to NSR. EF on TEE was 40%.  He saw Dr. Johney Frame to discuss ablation and opted to continue amiodarone.   Echo in 3/20 showed EF 50% with diffuse hypokinesis, normal RV, mild AS and mild MR.    He was lost to followup in this office again for over a year.  In 7/21, he was admitted for a couple of days with CHF exacerbation and was treated with IV Lasix.  Echo was done in 7/21, EF back down to 25-30%, global hypokinesis, severely decreased RV systolic function with severe RV enlargement, at least moderate aortic stenosis.  He was in NSR.    TEE was done in 10/21 for closer evaluation of aortic valve, this showed EF actually up to 50% with moderate AS and moderate MR.  LHC/RHC was done, showing normal filling pressures and significant LAD disease.  The patient is now s/p DES to LAD.   Today he returns for HF follow up. Last seen (1/22) volume was up, and torsemide was increased for 3 days. Now, overall feeling fine.  Having alerts on his apple watch of high HRs in 100-120s. Denies increasing SOB, CP, dizziness, or PND/Orthopnea. No complaints of palpitations. Appetite ok. No fever or chills. Weight at home 276 pounds, does not check regularly. Taking all medications. Has not taken any extra torsemide. Moving to [employer].  ECG (personally reviewed): atrial flutter today, 123 bpm, QTc 483 ms.  Labs (12/19): K 4.2, creatinine 1.35, LFTs normal Las (7/21): LDL 198, K 3.7, creatinine 1.75 Labs (8/21): K 4, creatinine 1.48, BNP 593 Labs (10/21): K 3.9, creatinine 1.28, LDL 230 (not taking statin), TSH normal, LFTs normal Labs (11/21): K 3.8, creatinine 1.34 Labs (1/22): K 4.5, creatinine 1.37, LDL 86, HDL 58, hgb 12.8, TSH normal, LFTs normal  SH: No tobacco or drug use. Rare ETOH use. Lives at home with his wife in [place]. 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  FH: No family hx of CAD or CHF.   Review of systems complete and found to be negative unless listed in HPI.   PMH: 1. CKD: Stage 3.  2. Atrial fibrillation: Paroxysmal, poorly tolerated with history of tachycardia-mediated CMP.   - DCCV x 2 in 11/19 and once in 12/19.  3. Type II diabetes.  4. HTN 5. Hyperlipidemia.  6. CAD: DES to mid RCA in 2017.   - LHC (11/19): 50% mLAD, 50%  dLAD, 80-85% proximal LCx stenosis, OM1 small vessel with 90% stenois. PCI to proximal LCx.  - LHC (10/21): 80% pLAD, 70% p/mLAD, 50% m/d LAD, 80% superior branch OM2, 60% inferior branch OM2, 50% mPDA. PCI with DES to LAD.  7. Chronic systolic CHF: Suspect this was primarily tachy-cardia mediated.  - Echo (11/19): EF 35-40%, moderate to severely decreased RV systolic function.  - TEE (12/19): EF 40%, diffuse hypokinesis, normal RV size and systolic function.  - Echo (3/20): EF 50%, normal RV, mild MR, mild AS with mean gradient 14 mmHg. - Echo (7/21): EF 25-30%, global hypokinesis, severely decreased RV systolic function with severe RV  enlargement, at least moderate aortic stenosis.   - TEE (10/21): EF 50%, anterior hypokinesis, basal inferior hypokinesis, normal RV, moderate AS with mean gradient 22 mmHg and AVA 1.18 cm^2, moderate MR with ERO 0.32 cm^2.  - RHC (10/21): mean RA 2, PA 29/4, mean PCWP 6, CI 2.66, easily crossed AoV with mean gradient 15 mmHg.  8. Aortic stenosis: Mild by 3/20 echo. 7/21 echo with at least moderate AS.    Current Outpatient Medications  Medication Sig Dispense Refill  . acetaminophen (TYLENOL) 500 MG tablet Take 500 mg by mouth every 8 (eight) hours as needed (for pain or headaches).     . ADVAIR DISKUS 100-50 MCG/DOSE AEPB INHALE 1 PUFF INTO THE LUNGS TWICE DAILY 60 each 0  . amiodarone (PACERONE) 200 MG tablet TAKE 1 TABLET(200 MG) BY MOUTH DAILY 30 tablet 0  . atorvastatin (LIPITOR) 80 MG tablet Take 1 tablet (80 mg total) by mouth daily. 30 tablet 6  . carvedilol (COREG) 12.5 MG tablet Take 1.5 tablets (18.75 mg total) by mouth 2 (two) times daily with a meal. 270 tablet 3  . cholecalciferol (VITAMIN D) 1000 units tablet Take 1 tablet (1,000 Units total) by mouth daily. 30 tablet 0  . clopidogrel (PLAVIX) 75 MG tablet Take 1 tablet (75 mg total) by mouth daily. 90 tablet 3  . ELIQUIS 5 MG TABS tablet TAKE 1 TABLET(5 MG) BY MOUTH TWICE DAILY 60 tablet 11  . ezetimibe (ZETIA) 10 MG tablet Take 1 tablet (10 mg total) by mouth daily. 90 tablet 3  . glipiZIDE (GLUCOTROL) 5 MG tablet Take 1 tablet (5 mg total) by mouth daily before breakfast. 90 tablet 3  . icosapent Ethyl (VASCEPA) 1 g capsule Take 2 capsules (2 g total) by mouth 2 (two) times daily. 120 capsule 11  . metFORMIN (GLUCOPHAGE) 1000 MG tablet Take 1 tablet (1,000 mg total) by mouth daily with breakfast. (Patient taking differently: Take 500 mg by mouth daily with breakfast.) 90 tablet 3  . ranolazine (RANEXA) 500 MG 12 hr tablet Take 1 tablet (500 mg total) by mouth 2 (two) times daily. 180 tablet 0  . sacubitril-valsartan (ENTRESTO)  49-51 MG Take 1 tablet by mouth 2 (two) times daily. 60 tablet 3  . torsemide (DEMADEX) 20 MG tablet Take 2 tablets (40 mg total) by mouth in the morning AND 1 tablet (20 mg total) every evening. 90 tablet 3  . nitroGLYCERIN (NITROSTAT) 0.4 MG SL tablet Place 1 tablet (0.4 mg total) under the tongue every 5 (five) minutes as needed for chest pain. (Patient not taking: Reported on 07/22/2020) 30 tablet 0   No current facility-administered medications for this encounter.    No Known Allergies  Vitals:   07/22/20 1428  BP: (!) 148/102  Pulse: (!) 115  SpO2: 98%  Weight: 135 kg (297 lb 9.6 oz)  Wt Readings from Last 3 Encounters:  07/22/20 135 kg (297 lb 9.6 oz)  06/03/20 133.5 kg (294 lb 6.4 oz)  05/25/20 129.8 kg (286 lb 4 oz)   PHYSICAL EXAM: General:  NAD. No resp difficulty HEENT: Normal Neck: Supple. JVP 6-7. Carotids 2+ bilat; no bruits. No lymphadenopathy or thryomegaly appreciated. Cor: PMI nondisplaced. Tachy irregular rate & rhythm. No rubs, gallops or murmurs. Lungs: Clear, diminished in bases Abdomen: Obese, soft, nontender, +distended. No hepatosplenomegaly. No bruits or masses. Good bowel sounds. Extremities: No cyanosis, clubbing, rash, 2+ LE edema Neuro: Alert & oriented x 3, cranial nerves grossly intact. Moves all 4 extremities w/o difficulty. Affect pleasant.  ASSESSMENT & PLAN:  1. Chronic systolic CHF: Prior ischemic cardiomyopathy, improved after PCI in 2017. Echo 03/2018 with EF back down to 35-40% and moderate-severe RV dysfunction. This was in the setting of atrial fibrillation with RVR. EF 40% on TEE 04/2018.  Echo was in 3/20, showing EF up to 50%.  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  Echo in 7/21 with EF 25-30%, severe RV dysfunction.  TEE in 10/21, however, showed EF up to 50%.  He had PCI to LAD in 10/21.  NYHA II-III symptoms functional status limited by knee OA and body habitus, weight up 10 lbs and he appears  overloaded on exam; likely due to being in atrial flutter.  - Start Jardiance 10 mg daily. BMET today, repeat 7-10 days. - Increase torsemide to 60 mg bid. May be able to back down after adding SGLT2i and if we can get him back in sinus rhythm. - Continue Entresto 49/51 mg bid.  -Continue Coreg 18.75 mg bid.  2. Atrial fibrillation: As above, concerned for possible tachy-mediated CMP. Had successful DC-CV on 03/29/18 but then back in atrial fibrillation.Ranolazine added, but failed DCCV again 04/02/18. DCCV 04/19/18 was successful and He is in fast atrial flutter today on amiodarone + ranolazine. Historically, he has felt poorly when out of rhythm, but today he feels fine. He was in SR at visit on 1/22. - Currently on ranolazine 500 mg bid. QTC 483 ms on today's ECG. - Increase amio to 200 mg bid.  LFTs and TSH (1/22) stable.  He will need a regular eye exam with amiodarone use.  - Continue Eliquis 5 mg bid. CBC (1/22) stable. Has not missed any doses. - He opted against atrial fibrillation ablation in the past.  3. CKD: Stage 3 . BMET today.  4. CAD: History of DES to RCA in 9/17. Underwent LHC 04/03/18 with PCI to prox left circ. LHC in 10/21 with PCI to LAD.  No CP. - He is on Eliquis so no ASA.   - Continue Plavix ideally x 1 yr (to 10/22).  - He is on atorvastatin 80 daily, with recent addition of Zetia. Will recheck lipids at next visit.  5. DM2: Start Jardiance today. BMET in 10 days. 6. Aortic stenosis: Moderate on TEE in 10/21 and easily crossed at cath.  7. Mitral regurgitation: Moderate by TEE in 10/21.   Follow up in 2 weeks in APP clinic, if he has not chemically converted after increasing diuretics and amio, will arrange for DCCV with Dr. Shirlee Latch (personally spoke to Dr. Shirlee Latch about plan). Patient has not missed any doses of Eliquis and instructed to call office if he experiences worsening of symptoms.  Anderson Malta Cornish, FNP-BC 07/22/20

## 2020-07-22 ENCOUNTER — Other Ambulatory Visit: Payer: Self-pay

## 2020-07-22 ENCOUNTER — Encounter (HOSPITAL_COMMUNITY): Payer: Self-pay

## 2020-07-22 ENCOUNTER — Ambulatory Visit (HOSPITAL_COMMUNITY)
Admission: RE | Admit: 2020-07-22 | Discharge: 2020-07-22 | Disposition: A | Discharge status: Home / Self Care | Payer: Medicare Other | Source: Ambulatory Visit | Attending: Family Medicine | Admitting: Family Medicine

## 2020-07-22 ENCOUNTER — Encounter (HOSPITAL_COMMUNITY): Payer: Self-pay | Admitting: Cardiology

## 2020-07-22 ENCOUNTER — Other Ambulatory Visit (HOSPITAL_COMMUNITY): Payer: Self-pay

## 2020-07-22 VITALS — BP 148/102 | HR 115 | Wt 297.6 lb

## 2020-07-22 DIAGNOSIS — Z7901 Long term (current) use of anticoagulants: Secondary | ICD-10-CM | POA: Insufficient documentation

## 2020-07-22 DIAGNOSIS — I080 Rheumatic disorders of both mitral and aortic valves: Secondary | ICD-10-CM | POA: Diagnosis not present

## 2020-07-22 DIAGNOSIS — I255 Ischemic cardiomyopathy: Secondary | ICD-10-CM | POA: Diagnosis not present

## 2020-07-22 DIAGNOSIS — I480 Paroxysmal atrial fibrillation: Secondary | ICD-10-CM | POA: Diagnosis not present

## 2020-07-22 DIAGNOSIS — Z7902 Long term (current) use of antithrombotics/antiplatelets: Secondary | ICD-10-CM | POA: Insufficient documentation

## 2020-07-22 DIAGNOSIS — I340 Nonrheumatic mitral (valve) insufficiency: Secondary | ICD-10-CM

## 2020-07-22 DIAGNOSIS — Z7984 Long term (current) use of oral hypoglycemic drugs: Secondary | ICD-10-CM | POA: Insufficient documentation

## 2020-07-22 DIAGNOSIS — I2510 Atherosclerotic heart disease of native coronary artery without angina pectoris: Secondary | ICD-10-CM | POA: Insufficient documentation

## 2020-07-22 DIAGNOSIS — Z7951 Long term (current) use of inhaled steroids: Secondary | ICD-10-CM | POA: Insufficient documentation

## 2020-07-22 DIAGNOSIS — E785 Hyperlipidemia, unspecified: Secondary | ICD-10-CM | POA: Diagnosis not present

## 2020-07-22 DIAGNOSIS — Z79899 Other long term (current) drug therapy: Secondary | ICD-10-CM | POA: Insufficient documentation

## 2020-07-22 DIAGNOSIS — I4891 Unspecified atrial fibrillation: Secondary | ICD-10-CM | POA: Diagnosis not present

## 2020-07-22 DIAGNOSIS — I4892 Unspecified atrial flutter: Secondary | ICD-10-CM | POA: Diagnosis not present

## 2020-07-22 DIAGNOSIS — I130 Hypertensive heart and chronic kidney disease with heart failure and stage 1 through stage 4 chronic kidney disease, or unspecified chronic kidney disease: Secondary | ICD-10-CM | POA: Diagnosis not present

## 2020-07-22 DIAGNOSIS — I350 Nonrheumatic aortic (valve) stenosis: Secondary | ICD-10-CM | POA: Diagnosis not present

## 2020-07-22 DIAGNOSIS — J45909 Unspecified asthma, uncomplicated: Secondary | ICD-10-CM | POA: Insufficient documentation

## 2020-07-22 DIAGNOSIS — Z955 Presence of coronary angioplasty implant and graft: Secondary | ICD-10-CM | POA: Diagnosis not present

## 2020-07-22 DIAGNOSIS — N1831 Chronic kidney disease, stage 3a: Secondary | ICD-10-CM | POA: Diagnosis not present

## 2020-07-22 DIAGNOSIS — E1122 Type 2 diabetes mellitus with diabetic chronic kidney disease: Secondary | ICD-10-CM | POA: Insufficient documentation

## 2020-07-22 DIAGNOSIS — I5022 Chronic systolic (congestive) heart failure: Secondary | ICD-10-CM | POA: Insufficient documentation

## 2020-07-22 DIAGNOSIS — N183 Chronic kidney disease, stage 3 unspecified: Secondary | ICD-10-CM | POA: Insufficient documentation

## 2020-07-22 MED ORDER — AMIODARONE HCL 200 MG PO TABS: 200.0000 mg | ORAL_TABLET | Freq: Two times a day (BID) | ORAL | 6 refills | Status: DC

## 2020-07-22 MED ORDER — EMPAGLIFLOZIN 10 MG PO TABS: 10.0000 mg | ORAL_TABLET | Freq: Every day | ORAL | 11 refills | Status: DC

## 2020-07-22 MED ORDER — TORSEMIDE 20 MG PO TABS: ORAL_TABLET | ORAL | 3 refills | Status: DC

## 2020-07-22 NOTE — Patient Instructions (Signed)
START Jardiance 10 mg, one tab daily INCREASE Amiodarone to 200 mg, one tab twice a day  Labs today We will only contact you if something comes back abnormal or we need to make some changes. Otherwise no news is good news!  Labs needed in 7-10 days  Your physician recommends that you schedule a follow-up appointment in: 2 weeks  in the Advanced Practitioners (PA/NP) Clinic   At the [facility], you and your health needs are our priority. As part of our continuing mission to provide you with exceptional heart care, we have created designated Provider Care Teams. These Care Teams include your primary Cardiologist (physician) and Advanced Practice Providers (APPs- Physician Assistants and Nurse Practitioners) who all work together to provide you with the care you need, when you need it.   You may see any of the following providers on your designated Care Team at your next follow up: Marland Kitchen Dr Arvilla Meres . Dr [name] . Dr Thornell Mule . Tonye Becket, NP . Robbie Lis, PA . Shanda Bumps [place],NP . Karle Plumber, PharmD   Please be sure to bring in all your medications bottles to every appointment.   Do the following things EVERYDAY: 1) Weigh yourself in the morning before breakfast. Write it down and keep it in a log. 2) Take your medicines as prescribed 3) Eat low salt foods--Limit salt (sodium) to 2000 mg per day.  4) Stay as active as you can everyday 5) Limit all fluids for the day to less than 2 liters  If you have any questions or concerns before your next appointment please send [place] a message through [place] or call our office at [phone].    TO LEAVE A MESSAGE FOR THE NURSE SELECT OPTION 2, PLEASE LEAVE A MESSAGE INCLUDING: . YOUR NAME . DATE OF BIRTH . CALL BACK NUMBER . REASON FOR CALL**this is important as we prioritize the call backs  YOU WILL RECEIVE A CALL BACK THE SAME DAY AS LONG AS YOU CALL BEFORE 4:00 PM

## 2020-07-23 ENCOUNTER — Other Ambulatory Visit (HOSPITAL_COMMUNITY): Payer: Self-pay

## 2020-08-02 ENCOUNTER — Ambulatory Visit (HOSPITAL_COMMUNITY)
Admission: RE | Admit: 2020-08-02 | Discharge: 2020-08-02 | Disposition: A | Discharge status: Home / Self Care | Payer: Medicare Other | Source: Ambulatory Visit | Attending: Internal Medicine | Admitting: Internal Medicine

## 2020-08-02 ENCOUNTER — Other Ambulatory Visit: Payer: Self-pay

## 2020-08-02 DIAGNOSIS — I5022 Chronic systolic (congestive) heart failure: Secondary | ICD-10-CM | POA: Diagnosis not present

## 2020-08-02 LAB — HEPATIC FUNCTION PANEL
ALT: 18 U/L (ref 0–44)
AST: 15 U/L (ref 15–41)
Albumin: 3.5 g/dL (ref 3.5–5.0)
Alkaline Phosphatase: 73 U/L (ref 38–126)
Bilirubin, Direct: 0.2 mg/dL (ref 0.0–0.2)
Indirect Bilirubin: 0.8 mg/dL (ref 0.3–0.9)
Total Bilirubin: 1 mg/dL (ref 0.3–1.2)
Total Protein: 7.2 g/dL (ref 6.5–8.1)

## 2020-08-02 LAB — LIPID PANEL
Cholesterol: 145 mg/dL (ref 0–200)
HDL: 48 mg/dL (ref >40)
LDL Cholesterol: 59 mg/dL (ref 0–99)
Total CHOL/HDL Ratio: 3 RATIO
Triglycerides: 189 mg/dL — ABNORMAL HIGH (ref <150)
VLDL: 38 mg/dL (ref 0–40)

## 2020-08-02 LAB — BASIC METABOLIC PANEL
Anion gap: 9 (ref 5–15)
BUN: 17 mg/dL (ref 8–23)
CO2: 28 mmol/L (ref 22–32)
Calcium: 8.6 mg/dL — ABNORMAL LOW (ref 8.9–10.3)
Chloride: 101 mmol/L (ref 98–111)
Creatinine, Ser: 1.76 mg/dL — ABNORMAL HIGH (ref 0.61–1.24)
GFR, Estimated: 39 mL/min — ABNORMAL LOW (ref >60)
Glucose, Bld: 164 mg/dL — ABNORMAL HIGH (ref 70–99)
Potassium: 4.1 mmol/L (ref 3.5–5.1)
Sodium: 138 mmol/L (ref 135–145)

## 2020-08-05 ENCOUNTER — Telehealth (HOSPITAL_COMMUNITY): Payer: Self-pay

## 2020-08-05 MED ORDER — TORSEMIDE 20 MG PO TABS: 40.0000 mg | ORAL_TABLET | Freq: Every morning | ORAL | 3 refills | Status: DC

## 2020-08-05 NOTE — Telephone Encounter (Signed)
Patient advised and verbalized understanding. Med list updated to reflect changes.  Meds ordered this encounter  Medications  . torsemide (DEMADEX) 20 MG tablet    Sig: Take 2 tablets (40 mg total) by mouth in the morning.    Dispense:  180 tablet    Refill:  3    Please cancel all previous orders for current medication. Change in dosage or pill size.

## 2020-08-05 NOTE — Telephone Encounter (Signed)
-----   Message from Sherald Hess, NP sent at 08/05/2020  9:30 AM EDT ----- Please cut back torsemide to 40 mg daily.   Amy Clegg NP_C  9:31 AM  ----- Message ----- From: Chinita Pester, CMA Sent: 08/02/2020   4:39 PM EDT To: Sherald Hess, NP  Patient currently takes 40mg  every am and 20mg  every evening should he change to 40mg  QD?

## 2020-08-07 ENCOUNTER — Other Ambulatory Visit (HOSPITAL_COMMUNITY): Payer: Self-pay | Admitting: Cardiology

## 2020-08-09 ENCOUNTER — Emergency Department (HOSPITAL_COMMUNITY): Payer: Medicare Other

## 2020-08-09 ENCOUNTER — Inpatient Hospital Stay (HOSPITAL_COMMUNITY)
Admission: EM | Admit: 2020-08-09 | Discharge: 2020-08-20 | DRG: 291 | Disposition: A | Discharge status: Home Health | Payer: Medicare Other | Attending: Internal Medicine | Admitting: Internal Medicine

## 2020-08-09 ENCOUNTER — Encounter (HOSPITAL_COMMUNITY): Payer: Self-pay

## 2020-08-09 ENCOUNTER — Encounter (HOSPITAL_COMMUNITY): Payer: Medicare Other

## 2020-08-09 DIAGNOSIS — J9690 Respiratory failure, unspecified, unspecified whether with hypoxia or hypercapnia: Secondary | ICD-10-CM | POA: Diagnosis not present

## 2020-08-09 DIAGNOSIS — I4892 Unspecified atrial flutter: Secondary | ICD-10-CM | POA: Diagnosis present

## 2020-08-09 DIAGNOSIS — J704 Drug-induced interstitial lung disorders, unspecified: Secondary | ICD-10-CM | POA: Diagnosis present

## 2020-08-09 DIAGNOSIS — J189 Pneumonia, unspecified organism: Secondary | ICD-10-CM | POA: Insufficient documentation

## 2020-08-09 DIAGNOSIS — I350 Nonrheumatic aortic (valve) stenosis: Secondary | ICD-10-CM | POA: Diagnosis not present

## 2020-08-09 DIAGNOSIS — I471 Supraventricular tachycardia: Secondary | ICD-10-CM | POA: Diagnosis not present

## 2020-08-09 DIAGNOSIS — E1122 Type 2 diabetes mellitus with diabetic chronic kidney disease: Secondary | ICD-10-CM | POA: Diagnosis not present

## 2020-08-09 DIAGNOSIS — Z9841 Cataract extraction status, right eye: Secondary | ICD-10-CM | POA: Diagnosis not present

## 2020-08-09 DIAGNOSIS — I4819 Other persistent atrial fibrillation: Secondary | ICD-10-CM | POA: Diagnosis present

## 2020-08-09 DIAGNOSIS — Z7902 Long term (current) use of antithrombotics/antiplatelets: Secondary | ICD-10-CM

## 2020-08-09 DIAGNOSIS — I5022 Chronic systolic (congestive) heart failure: Secondary | ICD-10-CM | POA: Diagnosis not present

## 2020-08-09 DIAGNOSIS — Z20822 Contact with and (suspected) exposure to covid-19: Secondary | ICD-10-CM | POA: Diagnosis present

## 2020-08-09 DIAGNOSIS — Z9842 Cataract extraction status, left eye: Secondary | ICD-10-CM

## 2020-08-09 DIAGNOSIS — Z79899 Other long term (current) drug therapy: Secondary | ICD-10-CM

## 2020-08-09 DIAGNOSIS — I5021 Acute systolic (congestive) heart failure: Secondary | ICD-10-CM | POA: Diagnosis not present

## 2020-08-09 DIAGNOSIS — T462X5A Adverse effect of other antidysrhythmic drugs, initial encounter: Secondary | ICD-10-CM | POA: Diagnosis present

## 2020-08-09 DIAGNOSIS — I082 Rheumatic disorders of both aortic and tricuspid valves: Secondary | ICD-10-CM | POA: Diagnosis not present

## 2020-08-09 DIAGNOSIS — E119 Type 2 diabetes mellitus without complications: Secondary | ICD-10-CM | POA: Diagnosis not present

## 2020-08-09 DIAGNOSIS — N1832 Chronic kidney disease, stage 3b: Secondary | ICD-10-CM | POA: Diagnosis not present

## 2020-08-09 DIAGNOSIS — Z7901 Long term (current) use of anticoagulants: Secondary | ICD-10-CM

## 2020-08-09 DIAGNOSIS — J984 Other disorders of lung: Secondary | ICD-10-CM

## 2020-08-09 DIAGNOSIS — J4530 Mild persistent asthma, uncomplicated: Secondary | ICD-10-CM

## 2020-08-09 DIAGNOSIS — I4891 Unspecified atrial fibrillation: Secondary | ICD-10-CM | POA: Diagnosis present

## 2020-08-09 DIAGNOSIS — I130 Hypertensive heart and chronic kidney disease with heart failure and stage 1 through stage 4 chronic kidney disease, or unspecified chronic kidney disease: Principal | ICD-10-CM | POA: Diagnosis present

## 2020-08-09 DIAGNOSIS — I480 Paroxysmal atrial fibrillation: Secondary | ICD-10-CM

## 2020-08-09 DIAGNOSIS — N179 Acute kidney failure, unspecified: Secondary | ICD-10-CM

## 2020-08-09 DIAGNOSIS — R55 Syncope and collapse: Secondary | ICD-10-CM | POA: Diagnosis not present

## 2020-08-09 DIAGNOSIS — Z7984 Long term (current) use of oral hypoglycemic drugs: Secondary | ICD-10-CM

## 2020-08-09 DIAGNOSIS — I959 Hypotension, unspecified: Secondary | ICD-10-CM | POA: Diagnosis not present

## 2020-08-09 DIAGNOSIS — E669 Obesity, unspecified: Secondary | ICD-10-CM | POA: Diagnosis present

## 2020-08-09 DIAGNOSIS — I5023 Acute on chronic systolic (congestive) heart failure: Secondary | ICD-10-CM | POA: Diagnosis not present

## 2020-08-09 DIAGNOSIS — R0602 Shortness of breath: Secondary | ICD-10-CM | POA: Diagnosis not present

## 2020-08-09 DIAGNOSIS — I2510 Atherosclerotic heart disease of native coronary artery without angina pectoris: Secondary | ICD-10-CM

## 2020-08-09 DIAGNOSIS — J8489 Other specified interstitial pulmonary diseases: Secondary | ICD-10-CM | POA: Diagnosis not present

## 2020-08-09 DIAGNOSIS — J9601 Acute respiratory failure with hypoxia: Secondary | ICD-10-CM | POA: Diagnosis not present

## 2020-08-09 DIAGNOSIS — Z955 Presence of coronary angioplasty implant and graft: Secondary | ICD-10-CM

## 2020-08-09 DIAGNOSIS — N189 Chronic kidney disease, unspecified: Secondary | ICD-10-CM

## 2020-08-09 DIAGNOSIS — J90 Pleural effusion, not elsewhere classified: Secondary | ICD-10-CM | POA: Diagnosis not present

## 2020-08-09 DIAGNOSIS — D72828 Other elevated white blood cell count: Secondary | ICD-10-CM | POA: Diagnosis present

## 2020-08-09 DIAGNOSIS — N1831 Chronic kidney disease, stage 3a: Secondary | ICD-10-CM

## 2020-08-09 DIAGNOSIS — E785 Hyperlipidemia, unspecified: Secondary | ICD-10-CM | POA: Diagnosis present

## 2020-08-09 DIAGNOSIS — I428 Other cardiomyopathies: Secondary | ICD-10-CM | POA: Diagnosis present

## 2020-08-09 DIAGNOSIS — Z7951 Long term (current) use of inhaled steroids: Secondary | ICD-10-CM | POA: Diagnosis not present

## 2020-08-09 DIAGNOSIS — E7849 Other hyperlipidemia: Secondary | ICD-10-CM | POA: Diagnosis not present

## 2020-08-09 DIAGNOSIS — D75839 Thrombocytosis, unspecified: Secondary | ICD-10-CM | POA: Diagnosis not present

## 2020-08-09 DIAGNOSIS — I509 Heart failure, unspecified: Secondary | ICD-10-CM

## 2020-08-09 DIAGNOSIS — E1165 Type 2 diabetes mellitus with hyperglycemia: Secondary | ICD-10-CM | POA: Diagnosis not present

## 2020-08-09 DIAGNOSIS — I5033 Acute on chronic diastolic (congestive) heart failure: Secondary | ICD-10-CM | POA: Diagnosis not present

## 2020-08-09 DIAGNOSIS — E875 Hyperkalemia: Secondary | ICD-10-CM | POA: Diagnosis not present

## 2020-08-09 DIAGNOSIS — R0902 Hypoxemia: Secondary | ICD-10-CM | POA: Diagnosis not present

## 2020-08-09 DIAGNOSIS — I517 Cardiomegaly: Secondary | ICD-10-CM | POA: Diagnosis not present

## 2020-08-09 DIAGNOSIS — I5043 Acute on chronic combined systolic (congestive) and diastolic (congestive) heart failure: Secondary | ICD-10-CM | POA: Diagnosis present

## 2020-08-09 DIAGNOSIS — I5030 Unspecified diastolic (congestive) heart failure: Secondary | ICD-10-CM

## 2020-08-09 DIAGNOSIS — Y92239 Unspecified place in hospital as the place of occurrence of the external cause: Secondary | ICD-10-CM | POA: Diagnosis not present

## 2020-08-09 DIAGNOSIS — I499 Cardiac arrhythmia, unspecified: Secondary | ICD-10-CM | POA: Diagnosis not present

## 2020-08-09 DIAGNOSIS — I4811 Longstanding persistent atrial fibrillation: Secondary | ICD-10-CM | POA: Diagnosis not present

## 2020-08-09 DIAGNOSIS — D631 Anemia in chronic kidney disease: Secondary | ICD-10-CM | POA: Diagnosis present

## 2020-08-09 DIAGNOSIS — Z6836 Body mass index (BMI) 36.0-36.9, adult: Secondary | ICD-10-CM

## 2020-08-09 DIAGNOSIS — T380X5A Adverse effect of glucocorticoids and synthetic analogues, initial encounter: Secondary | ICD-10-CM | POA: Diagnosis not present

## 2020-08-09 LAB — CBG MONITORING, ED: Glucose-Capillary: 99 mg/dL (ref 70–99)

## 2020-08-09 LAB — CBC WITH DIFFERENTIAL/PLATELET
Abs Immature Granulocytes: 0.08 10*3/uL — ABNORMAL HIGH (ref 0.00–0.07)
Basophils Absolute: 0.1 10*3/uL (ref 0.0–0.1)
Basophils Relative: 1 %
Eosinophils Absolute: 0.1 10*3/uL (ref 0.0–0.5)
Eosinophils Relative: 1 %
HCT: 35 % — ABNORMAL LOW (ref 39.0–52.0)
Hemoglobin: 11.5 g/dL — ABNORMAL LOW (ref 13.0–17.0)
Immature Granulocytes: 1 %
Lymphocytes Relative: 9 %
Lymphs Abs: 1 10*3/uL (ref 0.7–4.0)
MCH: 31.4 pg (ref 26.0–34.0)
MCHC: 32.9 g/dL (ref 30.0–36.0)
MCV: 95.6 fL (ref 80.0–100.0)
Monocytes Absolute: 1 10*3/uL (ref 0.1–1.0)
Monocytes Relative: 9 %
Neutro Abs: 8.6 10*3/uL — ABNORMAL HIGH (ref 1.7–7.7)
Neutrophils Relative %: 79 %
Platelets: 394 10*3/uL (ref 150–400)
RBC: 3.66 MIL/uL — ABNORMAL LOW (ref 4.22–5.81)
RDW: 12.5 % (ref 11.5–15.5)
WBC: 10.7 10*3/uL — ABNORMAL HIGH (ref 4.0–10.5)
nRBC: 0 % (ref 0.0–0.2)

## 2020-08-09 LAB — BASIC METABOLIC PANEL
Anion gap: 9 (ref 5–15)
BUN: 21 mg/dL (ref 8–23)
CO2: 23 mmol/L (ref 22–32)
Calcium: 8.3 mg/dL — ABNORMAL LOW (ref 8.9–10.3)
Chloride: 105 mmol/L (ref 98–111)
Creatinine, Ser: 1.73 mg/dL — ABNORMAL HIGH (ref 0.61–1.24)
GFR, Estimated: 40 mL/min — ABNORMAL LOW (ref >60)
Glucose, Bld: 187 mg/dL — ABNORMAL HIGH (ref 70–99)
Potassium: 4.2 mmol/L (ref 3.5–5.1)
Sodium: 137 mmol/L (ref 135–145)

## 2020-08-09 LAB — BETA-HYDROXYBUTYRIC ACID: Beta-Hydroxybutyric Acid: 0.48 mmol/L — ABNORMAL HIGH (ref 0.05–0.27)

## 2020-08-09 LAB — TROPONIN I (HIGH SENSITIVITY)
Troponin I (High Sensitivity): 7 ng/L (ref <18)
Troponin I (High Sensitivity): 10 ng/L (ref <18)

## 2020-08-09 LAB — BRAIN NATRIURETIC PEPTIDE: B Natriuretic Peptide: 339.4 pg/mL — ABNORMAL HIGH (ref 0.0–100.0)

## 2020-08-09 LAB — RESP PANEL BY RT-PCR (FLU A&B, COVID) ARPGX2
Influenza A by PCR: NEGATIVE
Influenza B by PCR: NEGATIVE
SARS Coronavirus 2 by RT PCR: NEGATIVE

## 2020-08-09 LAB — PROCALCITONIN: Procalcitonin: <0.1 ng/mL

## 2020-08-09 MED ORDER — SODIUM CHLORIDE 0.9 % IV SOLN
1.0000 g | Freq: Once | INTRAVENOUS | Status: AC
  Administered 2020-08-09: 1 g via INTRAVENOUS
  Filled 2020-08-09: qty 10

## 2020-08-09 MED ORDER — CARVEDILOL 6.25 MG PO TABS
18.7500 mg | ORAL_TABLET | Freq: Two times a day (BID) | ORAL | Status: DC
  Administered 2020-08-10: 18.75 mg via ORAL
  Filled 2020-08-09 (×2): qty 3

## 2020-08-09 MED ORDER — INSULIN ASPART 100 UNIT/ML ~~LOC~~ SOLN
0.0000 [IU] | Freq: Every day | SUBCUTANEOUS | Status: DC
  Administered 2020-08-13: 2 [IU] via SUBCUTANEOUS
  Administered 2020-08-14 – 2020-08-16 (×3): 3 [IU] via SUBCUTANEOUS
  Administered 2020-08-17: 4 [IU] via SUBCUTANEOUS
  Administered 2020-08-18 – 2020-08-19 (×2): 3 [IU] via SUBCUTANEOUS

## 2020-08-09 MED ORDER — CLOPIDOGREL BISULFATE 75 MG PO TABS
75.0000 mg | ORAL_TABLET | Freq: Every day | ORAL | Status: DC
Start: 2020-08-10 — End: 2020-08-20
  Administered 2020-08-10 – 2020-08-20 (×11): 75 mg via ORAL
  Filled 2020-08-09 (×11): qty 1

## 2020-08-09 MED ORDER — RANOLAZINE ER 500 MG PO TB12
500.0000 mg | ORAL_TABLET | Freq: Two times a day (BID) | ORAL | Status: DC
  Administered 2020-08-09 – 2020-08-20 (×21): 500 mg via ORAL
  Filled 2020-08-09 (×24): qty 1

## 2020-08-09 MED ORDER — MOMETASONE FURO-FORMOTEROL FUM 100-5 MCG/ACT IN AERO
2.0000 | INHALATION_SPRAY | Freq: Two times a day (BID) | RESPIRATORY_TRACT | Status: DC
  Administered 2020-08-09 – 2020-08-20 (×21): 2 via RESPIRATORY_TRACT
  Filled 2020-08-09: qty 8.8

## 2020-08-09 MED ORDER — SODIUM CHLORIDE 0.9 % IV SOLN
500.0000 mg | Freq: Once | INTRAVENOUS | Status: AC
  Administered 2020-08-09: 500 mg via INTRAVENOUS
  Filled 2020-08-09: qty 500

## 2020-08-09 MED ORDER — VITAMIN D 25 MCG (1000 UNIT) PO TABS
1000.0000 [IU] | ORAL_TABLET | Freq: Every day | ORAL | Status: DC
  Administered 2020-08-10 – 2020-08-13 (×4): 1000 [IU] via ORAL
  Filled 2020-08-09 (×4): qty 1

## 2020-08-09 MED ORDER — APIXABAN 5 MG PO TABS
5.0000 mg | ORAL_TABLET | Freq: Two times a day (BID) | ORAL | Status: DC
  Administered 2020-08-09 – 2020-08-20 (×22): 5 mg via ORAL
  Filled 2020-08-09 (×23): qty 1

## 2020-08-09 MED ORDER — INSULIN ASPART 100 UNIT/ML ~~LOC~~ SOLN
0.0000 [IU] | Freq: Three times a day (TID) | SUBCUTANEOUS | Status: DC
  Administered 2020-08-10 – 2020-08-11 (×4): 2 [IU] via SUBCUTANEOUS
  Administered 2020-08-11: 1 [IU] via SUBCUTANEOUS
  Administered 2020-08-11 – 2020-08-12 (×2): 2 [IU] via SUBCUTANEOUS
  Administered 2020-08-12: 1 [IU] via SUBCUTANEOUS
  Administered 2020-08-12 – 2020-08-14 (×6): 2 [IU] via SUBCUTANEOUS
  Administered 2020-08-14: 5 [IU] via SUBCUTANEOUS
  Administered 2020-08-15: 7 [IU] via SUBCUTANEOUS
  Administered 2020-08-15: 5 [IU] via SUBCUTANEOUS
  Administered 2020-08-15: 7 [IU] via SUBCUTANEOUS
  Administered 2020-08-16: 5 [IU] via SUBCUTANEOUS
  Administered 2020-08-16 (×2): 7 [IU] via SUBCUTANEOUS
  Administered 2020-08-17: 5 [IU] via SUBCUTANEOUS
  Administered 2020-08-17: 9 [IU] via SUBCUTANEOUS
  Administered 2020-08-17 – 2020-08-18 (×2): 5 [IU] via SUBCUTANEOUS
  Administered 2020-08-18: 3 [IU] via SUBCUTANEOUS
  Administered 2020-08-18: 5 [IU] via SUBCUTANEOUS
  Administered 2020-08-19: 7 [IU] via SUBCUTANEOUS
  Administered 2020-08-19 (×2): 3 [IU] via SUBCUTANEOUS
  Administered 2020-08-20: 1 [IU] via SUBCUTANEOUS
  Administered 2020-08-20: 3 [IU] via SUBCUTANEOUS

## 2020-08-09 MED ORDER — AMIODARONE HCL 200 MG PO TABS
200.0000 mg | ORAL_TABLET | Freq: Two times a day (BID) | ORAL | Status: DC
  Administered 2020-08-09 – 2020-08-10 (×2): 200 mg via ORAL
  Filled 2020-08-09 (×2): qty 1

## 2020-08-09 MED ORDER — FUROSEMIDE 10 MG/ML IJ SOLN
80.0000 mg | Freq: Two times a day (BID) | INTRAMUSCULAR | Status: AC
  Administered 2020-08-09 – 2020-08-13 (×9): 80 mg via INTRAVENOUS
  Filled 2020-08-09 (×10): qty 8

## 2020-08-09 MED ORDER — SODIUM CHLORIDE 0.9 % IV BOLUS
500.0000 mL | Freq: Once | INTRAVENOUS | Status: AC
  Administered 2020-08-09: 500 mL via INTRAVENOUS

## 2020-08-09 NOTE — ED Notes (Signed)
RN asked phlebotomy to stick pt.

## 2020-08-09 NOTE — H&P (Signed)
History and Physical    David Huynh ZOX:[id number] DOB: Jul 09, 1940 DOA: 08/09/2020  PCP: Bradd CanaryBlyth, Stacey A, MD  Patient coming from Home- recently moved into a retirement community independent living with his wife  I have personally briefly reviewed [facility]  Chief Complaint: Dizziness and syncope  HPI: David Huynh is a 80 y.o. male with medical history significant for CAD, chronic systolic CHF, PAF, HTN, HLD, DM, moderate AS, CKD 3, mild persistent asthma who presents with concerns of increasing shortness of breath and syncope. About 2 days ago he began to note worsening shortness of breath with exertion and then progressively became short of breath even with just standing.  Today he got up out of bed and was getting ready to follow up with cardiology when he felt dizzy with heart palpitations and fell back onto his bed with loss of consciousness.  Wife called EMS afterwards.  Noted to be in atrial fibrillation/atrial flutter with rates of 100-120 by EMS.  CBG of 340.  Also noted to have some orthostatic changes from laying to sitting going from systolic of 140-1 08.  He has been having some increasing lower extremity and abdominal edema for past several weeks.He was last seen by cardiology on 07/22/20 and has been progressively getting increase in Torsemide since January due to increase in wight. Also had increase in his amiodarone since he was noted to be in atrial flutter during that visit. He is followed by endocrinology for his diabetes and previously had his Metformin reduced due to worsening GFR of less than 45.  ED Course: Patient was afebrile and had converted back to sinus tachycardia spontaneously on my evaluation.  He was tachypneic and had hypoxia down to 88% and placed on 2 L.  Had mild leukocytosis of 10.7.  Mild anemia of 11.5.  Creatinine elevated at 1.73 from a prior baseline of around 1.2-1.3.  BG of 187.  BNP somewhat  unequivocal at 339.  Troponin reassuring at 7.  Chest x-ray shows cardiomegaly with small bilateral pleural effusion.  There is also patchy airspace opacity right greater than left concerning for pneumonia.  Patient was given 500 cc of normal saline bolus in the ED and started on IV Rocephin and azithromycin coverage for pneumonia empirically by ED physician Dr. Audley HoseHong while awaiting procalcitonin. Hospitalist on-call for admission.  Review of Systems: Constitutional: No Weight Change, No Fever ENT/Mouth: No sore throat, No Rhinorrhea Eyes: No Eye Pain, No Vision Changes Cardiovascular: No Chest Pain, + SOB, + Dyspnea on Exertion,+Edema, + Palpitations Respiratory: + Cough, No Sputum, No Wheezing,  Gastrointestinal: No Nausea, No Vomiting, No Diarrhea Genitourinary: no Urinary Incontinence, No Urgency, No Flank Pain Musculoskeletal: No Arthralgias, No Myalgias Skin: No Skin Lesions, No Pruritus, Neuro: no Weakness, No Numbness,  + Loss of Consciousness, + Syncope Psych: No Anxiety/Panic, No Depression, no decrease appetite Heme/Lymph: No Bruising, No Bleeding  Past Medical History:  Diagnosis Date  . Allergy    seasonal  . Asthma    mild, intermittent  . Atrial fibrillation with rapid ventricular response (HCC) 03/25/2018  . CAD, multiple vessel    Three-vessel disease involving mid RCA 85% (PCI with DES), OM 2 80% in branch and ostD1 ~90%. Only the mid RCA with PCI target.   . Carotid bruit 08/05/2010   Bruit noted on left   . Chronic combined systolic and diastolic CHF (congestive heart failure) (HCC)   . CKD (chronic kidney disease), stage III (HCC)   .  Diabetes mellitus    type 2  . Fracture of multiple ribs 02/12/2015  . Hemothorax on right 01/23/2015  . Hyperlipidemia   . Hypertension   . Hypocalcemia 02/12/2015  . Lactic acidosis 03/25/2018  . Liver function study, abnormal 04/26/2011  . Multiple rib fractures   . Obesity   . Persistent [facility]      Past Surgical History:  Procedure Laterality Date  . APPENDECTOMY  1975  . CARDIAC CATHETERIZATION N/A 02/17/2016   Procedure: Left Heart Cath and Coronary Angiography;  Surgeon: Marykay Lex, MD;  Location: [facility] INVASIVE CV LAB;  Service: Cardiovascular: mRCA 85% (PCI), branch of OM2 80%,  ostD1 90%; EF 35-45%  . CARDIAC CATHETERIZATION N/A 02/17/2016   Procedure: Coronary Stent Intervention;  Surgeon: Marykay Lex, MD;  Location: [facility] INVASIVE CV LAB;  Service: Cardiovascular: mRCA PCI : STENT PROMUS PREM MR 3.5X28 DES  . CARDIOVERSION N/A 03/29/2018   Procedure: CARDIOVERSION;  Surgeon: Laurey Morale, MD;  Location: [facility] ENDOSCOPY;  Service: Cardiovascular;  Laterality: N/A;  . CARDIOVERSION N/A 04/02/2018   Procedure: CARDIOVERSION;  Surgeon: Laurey Morale, MD;  Location: [facility] ENDOSCOPY;  Service: Cardiovascular;  Laterality: N/A;  . CARDIOVERSION N/A 04/19/2018   Procedure: CARDIOVERSION;  Surgeon: Laurey Morale, MD;  Location: [facility] ENDOSCOPY;  Service: Cardiovascular;  Laterality: N/A;  . CATARACT EXTRACTION  2010   b/l  . CORONARY STENT INTERVENTION N/A 04/03/2018   Procedure: CORONARY STENT INTERVENTION;  Surgeon: Lennette Bihari, MD;  Location: [facility] INVASIVE CV LAB;  Service: Cardiovascular;  Laterality: N/A;  . CORONARY STENT INTERVENTION N/A 03/02/2020   Procedure: CORONARY STENT INTERVENTION;  Surgeon: Corky Crafts, MD;  Location: [facility] INVASIVE CV LAB;  Service: Cardiovascular;  Laterality: N/A;  . INTRAVASCULAR ULTRASOUND/IVUS N/A 03/02/2020   Procedure: Intravascular Ultrasound/IVUS;  Surgeon: Corky Crafts, MD;  Location: [facility] INVASIVE CV LAB;  Service: Cardiovascular;  Laterality: N/A;  . RETINAL LASER PROCEDURE  1985  . RIGHT/LEFT HEART CATH AND CORONARY ANGIOGRAPHY N/A 04/03/2018   Procedure: RIGHT/LEFT HEART CATH AND CORONARY ANGIOGRAPHY;  Surgeon: Laurey Morale, MD;  Location: [facility] INVASIVE CV LAB;  Service: Cardiovascular;  Laterality: N/A;  . RIGHT/LEFT  HEART CATH AND CORONARY ANGIOGRAPHY N/A 03/02/2020   Procedure: RIGHT/LEFT HEART CATH AND CORONARY ANGIOGRAPHY;  Surgeon: Laurey Morale, MD;  Location: [facility] INVASIVE CV LAB;  Service: Cardiovascular;  Laterality: N/A;  . TEE WITHOUT CARDIOVERSION N/A 03/29/2018   Procedure: TRANSESOPHAGEAL ECHOCARDIOGRAM (TEE);  Surgeon: Laurey Morale, MD;  Location: [facility] ENDOSCOPY;  Service: Cardiovascular;  Laterality: N/A;  . TEE WITHOUT CARDIOVERSION N/A 04/19/2018   Procedure: TRANSESOPHAGEAL ECHOCARDIOGRAM (TEE);  Surgeon: Laurey Morale, MD;  Location: [facility] ENDOSCOPY;  Service: Cardiovascular;  Laterality: N/A;  . TEE WITHOUT CARDIOVERSION N/A 03/02/2020   Procedure: TRANSESOPHAGEAL ECHOCARDIOGRAM (TEE);  Surgeon: Laurey Morale, MD;  Location: [facility] ENDOSCOPY;  Service: Cardiovascular;  Laterality: N/A;  . TONSILLECTOMY AND ADENOIDECTOMY  1947  . TRANSTHORACIC ECHOCARDIOGRAM  02/2016   Mild LVH. Moderately reduced EF of 35-40% with diffuse hypokinesis. Indeterminate diastolic function. Likely elevated LA pressures. Mild aortic stenosis (mean gradient 10 mmHg). Mild MR. Mild LA dilation. Mild to moderate PA hypertension with 39 mmHg  . VIDEO ASSISTED THORACOSCOPY (VATS)/THOROCOTOMY Right 01/24/2015   Procedure: VIDEO ASSISTED THORACOSCOPY (VATS) FOR DRAINAGE OF RIGHT HEMOTHORAX;  Surgeon: Alleen Borne, MD;  Location: [facility] OR;  Service: Thoracic;  Laterality: Right;     reports that he has never smoked. He has never  used smokeless tobacco. He reports current alcohol use. He reports that he does not use drugs. Social History  No Known Allergies  Family History  Problem Relation Age of Onset  . CAD Neg Hx      Prior to Admission medications   Medication Sig Start Date End Date Taking? Authorizing Provider  acetaminophen (TYLENOL) 500 MG tablet Take 500 mg by mouth every 8 (eight) hours as needed (for pain or headaches).     [provider]  ADVAIR DISKUS 100-50 MCG/DOSE AEPB INHALE 1 PUFF  INTO THE LUNGS TWICE DAILY 07/05/20   Saguier, Ramon Dredge, PA-C  amiodarone (PACERONE) 200 MG tablet Take 1 tablet (200 mg total) by mouth 2 (two) times daily. 07/22/20   Milford, Anderson Malta, FNP  atorvastatin (LIPITOR) 80 MG tablet Take 1 tablet (80 mg total) by mouth daily. 02/17/20 03/19/20  Laurey Morale, MD  carvedilol (COREG) 12.5 MG tablet Take 1.5 tablets (18.75 mg total) by mouth 2 (two) times daily with a meal. 05/05/20   Laurey Morale, MD  cholecalciferol (VITAMIN D) 1000 units tablet Take 1 tablet (1,000 Units total) by mouth daily. 12/09/19   Amin, Loura Halt, MD  clopidogrel (PLAVIX) 75 MG tablet Take 1 tablet (75 mg total) by mouth daily. 06/03/20 06/03/21  Laurey Morale, MD  ELIQUIS 5 MG TABS tablet TAKE 1 TABLET(5 MG) BY MOUTH TWICE DAILY 05/25/20   Laurey Morale, MD  empagliflozin (JARDIANCE) 10 MG TABS tablet Take 1 tablet (10 mg total) by mouth daily before breakfast. 07/22/20   Clegg, Amy D, NP  ENTRESTO 49-51 MG TAKE 1 TABLET BY MOUTH TWICE DAILY 08/09/20   Laurey Morale, MD  ezetimibe (ZETIA) 10 MG tablet Take 1 tablet (10 mg total) by mouth daily. 06/17/20   Laurey Morale, MD  glipiZIDE (GLUCOTROL) 5 MG tablet Take 1 tablet (5 mg total) by mouth daily before breakfast. 02/24/20   Shamleffer, Konrad Dolores, MD  icosapent Ethyl (VASCEPA) 1 g capsule Take 2 capsules (2 g total) by mouth 2 (two) times daily. 06/04/20   Laurey Morale, MD  metFORMIN (GLUCOPHAGE) 1000 MG tablet Take 1 tablet (1,000 mg total) by mouth daily with breakfast. Patient taking differently: Take 500 mg by mouth daily with breakfast. 03/04/20   Laurey Morale, MD  nitroGLYCERIN (NITROSTAT) 0.4 MG SL tablet Place 1 tablet (0.4 mg total) under the tongue every 5 (five) minutes as needed for chest pain. Patient not taking: Reported on 07/22/2020 12/09/19   Dimple Nanas, MD  ranolazine (RANEXA) 500 MG 12 hr tablet Take 1 tablet (500 mg total) by mouth 2 (two) times daily. 07/08/20   Laurey Morale,  MD  torsemide [facility]) 20 MG tablet Take 2 tablets (40 mg total) by mouth in the morning. 08/05/20   Tonye Becket D, NP    Physical Exam: Vitals:   08/09/20 2000 08/09/20 2015 08/09/20 2030 08/09/20 2045  BP: (!) 150/104 (!) 141/87 (!) 144/94 (!) 137/97  Pulse: (!) 120 (!) 116 (!) 118 (!) 118  Resp: (!) 24 (!) 27 (!) 21 (!) 22  SpO2: 96% 95% 93% 91%    Constitutional: NAD, calm, comfortable, obese male lying at 20 degree incline in bed Vitals:   08/09/20 2000 08/09/20 2015 08/09/20 2030 08/09/20 2045  BP: (!) 150/104 (!) 141/87 (!) 144/94 (!) 137/97  Pulse: (!) 120 (!) 116 (!) 118 (!) 118  Resp: (!) 24 (!) 27 (!) 21 (!) 22  SpO2: 96% 95% 93%  91%   Eyes: PERRL, lids and conjunctivae normal ENMT: Mucous membranes are moist.  Neck: normal, supple Respiratory: Left bibasilar crackle with no wheezing.  Normal respiratory effort on 2 L via nasal cannula. No accessory muscle use.  Able to speak in full sentences. Cardiovascular: Regular rate and rhythm, no murmurs / rubs / gallops.  +3 pitting edema bilateral distal pretibial region.   Abdomen: no tenderness, no masses palpated. Bowel sounds positive.  Musculoskeletal: no clubbing / cyanosis. No joint deformity upper and lower extremities. Good ROM, no contractures. Normal muscle tone.  Skin: no rashes, lesions, ulcers. No induration Neurologic: CN 2-12 grossly intact. Sensation intact,Strength 5/5 in all 4.  Psychiatric: Normal judgment and insight. Alert and oriented x 3. Normal mood.     Labs on Admission: I have personally reviewed following labs and imaging studies  CBC: Recent Labs  Lab 08/09/20 1643  WBC 10.7*  NEUTROABS 8.6*  HGB 11.5*  HCT 35.0*  MCV 95.6  PLT 394   Basic Metabolic Panel: Recent Labs  Lab 08/09/20 1800  NA 137  K 4.2  CL 105  CO2 23  GLUCOSE 187*  BUN 21  CREATININE 1.73*  CALCIUM 8.3*   GFR: CrCl cannot be calculated (Unknown ideal weight.). Liver Function Tests: No results for  input(s): AST, ALT, ALKPHOS, BILITOT, PROT, ALBUMIN in the last 168 hours. No results for input(s): LIPASE, AMYLASE in the last 168 hours. No results for input(s): AMMONIA in the last 168 hours. Coagulation Profile: No results for input(s): INR, PROTIME in the last 168 hours. Cardiac Enzymes: No results for input(s): CKTOTAL, CKMB, CKMBINDEX, TROPONINI in the last 168 hours. BNP (last 3 results) Recent Labs    12/17/19 1327  PROBNP 593.0*   HbA1C: No results for input(s): HGBA1C in the last 72 hours. CBG: No results for input(s): GLUCAP in the last 168 hours. Lipid Profile: No results for input(s): CHOL, HDL, LDLCALC, TRIG, CHOLHDL, LDLDIRECT in the last 72 hours. Thyroid Function Tests: No results for input(s): TSH, T4TOTAL, FREET4, T3FREE, THYROIDAB in the last 72 hours. Anemia Panel: No results for input(s): VITAMINB12, FOLATE, FERRITIN, TIBC, IRON, RETICCTPCT in the last 72 hours. Urine analysis:    Component Value Date/Time   COLORURINE STRAW (A) 03/25/2018 1627   APPEARANCEUR CLEAR 03/25/2018 1627   LABSPEC >1.030 (H) 03/25/2018 1627   PHURINE 5.5 03/25/2018 1627   GLUCOSEU 100 (A) 03/25/2018 1627   HGBUR TRACE (A) 03/25/2018 1627   BILIRUBINUR NEGATIVE 03/25/2018 1627   KETONESUR NEGATIVE 03/25/2018 1627   PROTEINUR 100 (A) 03/25/2018 1627   NITRITE NEGATIVE 03/25/2018 1627   LEUKOCYTESUR NEGATIVE 03/25/2018 1627    Radiological Exams on Admission: DG Chest Port 1 View  Result Date: 08/09/2020 CLINICAL DATA:  Shortness of breath EXAM: PORTABLE CHEST 1 VIEW COMPARISON:  12/17/2019, CT 02/11/2016 FINDINGS: Cardiomegaly with vascular congestion. Small bilateral pleural effusions. Patchy airspace disease in the right greater than left lung base. No pneumothorax. IMPRESSION: Cardiomegaly with vascular congestion and small bilateral pleural effusions. Patchy airspace disease at the right greater than left lung base, suspicious for pneumonia Electronically Signed   By: Jasmine Pang M.D.   On: 08/09/2020 17:46      Assessment/Plan  Acute hypoxemic respiratory failure secondary to acute combined systolic and diastolic heart failure -Patient has already been working with cardiology outpatient and increasing his torsemide for continuous increased weight.  Chest x-ray here shows cardiomegaly with small bilateral pleural effusion. -Cardiology suspect that he has tachycardia induced cardiomyopathy which  is in line with the fact that he presents today with paroxysmal atrial fibrillation with RVR arrival. -Increased to 60 mg BID Torsemide at home 2 weeks ago by cardiology. Start IV 80mg  BID Lasix here - Last echo with EF of 50% on 10/21. Repeat.  - pt initally started on IV antibiotics for suspected pneumonia on CXR but procalcitonin of <0.10- will d/c since more likely fluid overload causing dyspnea  Acute combined systolic diastolic heart failure Diuresis above Continue Coreg Hold Entresto while monitoring AKI  Paroxysmal atrial fibrillation with RVR Had successful DC-CV on 03/29/18 but then back in atrial fibrillation. Ranolazine added, but failed DCCV again 04/02/18. DCCV 04/19/18 was successful but noted to be back in fast atrial flutter at last cardiology office follow-up on 3/10 and had amiodarone increased. -Patient noted to be in atrial fibrillation/a flutter by EMS and converted spontaneous back to NSR on my evaluation -continue Ranolazine  -continue recently increased amiodarone at 200mg  BID - consider EP/Cardiology consult in the morning -keep on continous telemetry  -Continue Eliquis  Syncope w/LOC Likely Secondary to tachyarrhythmia  Keep on continuous monitoring Obtain echo   Acute on chronic CKD stage 3a creatinine of 1.73 from prior of 1.2-1.3 Monitor closely on diuresis  avoid nephrotoxic agent   Type 2 diabetes Follows with endocrinology outpatient Hemoglobin A1c of 6.5 two months ago place on low dose SSI   CAD with DES to RCA and  mLAD continue Eliquis and plavix   History of mild persistent asthma Continue bronchodilator  DVT prophylaxis:.Eliquis Code Status: Full Family Communication: Plan discussed with patient at bedside  disposition Plan: Home with at least 2 midnight stays  Consults called:  Admission status: inpatient  Level of care: Telemetry Cardiac  Status is: Inpatient  Remains inpatient appropriate because:Inpatient level of care appropriate due to severity of illness   Dispo: The patient is from: Home              Anticipated d/c is to: Home              Patient currently is not medically stable to d/c.   Difficult to place patient No         5/10 DO [facility]   If 7PM-7AM, please contact night-coverage [email or web address]   08/09/2020, 9:02 PM

## 2020-08-09 NOTE — ED Triage Notes (Signed)
Pt BIB [facility] for SOB/Dizzy x 2 days culminating in a near synopal episode this morning at this home in his bedroom when he was preparing to go see his Cardiologist.    Pt is in afib at a rate of 100-120 with hx of the same.  Recently tx for the afib/RVR and started on a new med.  EMS reports pt also has DM and recently had a change in his metformin dosing. CBG 340 w EMS.  EMs reports orthostatic changes from lying to sitting of 140-108 SBP.

## 2020-08-09 NOTE — ED Notes (Signed)
Pt was trying to use urinal and ripped out IV

## 2020-08-09 NOTE — ED Provider Notes (Signed)
[facility]   CSN: [id number] Arrival date & time: 08/09/20  1611     History No chief complaint on file.   David Huynh is a 80 y.o. male.  Patient presents to ER chief complaint of shortness of breath lightheadedness.  He describes an episode at home where he felt lightheaded and about to pass out.  He states that he did not quite pass out but came close.  He was on his way to see his cardiologist when this happened so he change his mind and came into the ER.  Denies any headache or chest pain.  No fall or other trauma.  Denies fevers or cough.  Positive nausea.  No vomiting or diarrhea.        Past Medical History:  Diagnosis Date  . Allergy    seasonal  . Asthma    mild, intermittent  . Atrial fibrillation with rapid ventricular response (HCC) 03/25/2018  . CAD, multiple vessel    Three-vessel disease involving mid RCA 85% (PCI with DES), OM 2 80% in branch and ostD1 ~90%. Only the mid RCA with PCI target.   . Carotid bruit 08/05/2010   Bruit noted on left   . Chronic combined systolic and diastolic CHF (congestive heart failure) (HCC)   . CKD (chronic kidney disease), stage III (HCC)   . Diabetes mellitus    type 2  . Fracture of multiple ribs 02/12/2015  . Hemothorax on right 01/23/2015  . Hyperlipidemia   . Hypertension   . Hypocalcemia 02/12/2015  . Lactic acidosis 03/25/2018  . Liver function study, abnormal 04/26/2011  . Multiple rib fractures   . Obesity   . Persistent atrial fibrillation [facility](HCC)     Patient Active Problem List   Diagnosis Date Noted  . Community acquired pneumonia 08/09/2020  . Syncope and collapse 08/09/2020  . Acute-on-chronic kidney injury (HCC) 08/09/2020  . Type 2 diabetes mellitus with diabetic polyneuropathy, without long-term current use of insulin (HCC) 02/24/2020  . Type 2 diabetes mellitus with stage 3b chronic kidney disease, without long-term current use of insulin (HCC)  02/24/2020  . Diabetes mellitus (HCC) 02/24/2020  . Acute on chronic combined systolic (congestive) and diastolic (congestive) heart failure (HCC) 12/06/2019  . Acute respiratory failure with hypoxia (HCC) 12/06/2019  . A-fib (HCC) 03/25/2018  . CAD in native artery   . Aortic stenosis, mild 06/28/2016  . Cardiomyopathy, ischemic 02/17/2016  . Acute on chronic combined systolic and diastolic CHF (congestive heart failure) (HCC) 02/17/2016  . Dyspnea   . Hypertensive heart disease   . Hypocalcemia 02/12/2015  . Fracture of multiple ribs 02/12/2015  . Other specified forms of effusion, except tuberculous   . Essential hypertension   . Other and unspecified hyperlipidemia   . Liver function study, abnormal 04/26/2011  . Obesity   . Hyperlipidemia with target LDL less than 70   . Type 2 diabetes mellitus without complication, without long-term current use of insulin (HCC)   . Mild persistent asthma   . Allergic state     Past Surgical History:  Procedure Laterality Date  . APPENDECTOMY  1975  . CARDIAC CATHETERIZATION N/A 02/17/2016   Procedure: Left Heart Cath and Coronary Angiography;  Surgeon: Marykay Lexavid W Harding, MD;  [facility];  Service: Cardiovascular: mRCA 85% (PCI), branch of OM2 80%,  ostD1 90%; EF 35-45%  . CARDIAC CATHETERIZATION N/A 02/17/2016   Procedure: Coronary Stent Intervention;  Surgeon: Marykay Lexavid W Harding,  MD;  Location: [facility] INVASIVE CV LAB;  Service: Cardiovascular: mRCA PCI : STENT PROMUS PREM MR 3.5X28 DES  . CARDIOVERSION N/A 03/29/2018   Procedure: CARDIOVERSION;  Surgeon: Laurey Morale, MD;  Location: [facility] ENDOSCOPY;  Service: Cardiovascular;  Laterality: N/A;  . CARDIOVERSION N/A 04/02/2018   Procedure: CARDIOVERSION;  Surgeon: Laurey Morale, MD;  Location: [facility] ENDOSCOPY;  Service: Cardiovascular;  Laterality: N/A;  . CARDIOVERSION N/A 04/19/2018   Procedure: CARDIOVERSION;  Surgeon: Laurey Morale, MD;  Location: [facility] ENDOSCOPY;  Service:  Cardiovascular;  Laterality: N/A;  . CATARACT EXTRACTION  2010   b/l  . CORONARY STENT INTERVENTION N/A 04/03/2018   Procedure: CORONARY STENT INTERVENTION;  Surgeon: Lennette Bihari, MD;  Location: [facility] INVASIVE CV LAB;  Service: Cardiovascular;  Laterality: N/A;  . CORONARY STENT INTERVENTION N/A 03/02/2020   Procedure: CORONARY STENT INTERVENTION;  Surgeon: Corky Crafts, MD;  Location: [facility] INVASIVE CV LAB;  Service: Cardiovascular;  Laterality: N/A;  . INTRAVASCULAR ULTRASOUND/IVUS N/A 03/02/2020   Procedure: Intravascular Ultrasound/IVUS;  Surgeon: Corky Crafts, MD;  Location: [facility] INVASIVE CV LAB;  Service: Cardiovascular;  Laterality: N/A;  . RETINAL LASER PROCEDURE  1985  . RIGHT/LEFT HEART CATH AND CORONARY ANGIOGRAPHY N/A 04/03/2018   Procedure: RIGHT/LEFT HEART CATH AND CORONARY ANGIOGRAPHY;  Surgeon: Laurey Morale, MD;  Location: [facility] INVASIVE CV LAB;  Service: Cardiovascular;  Laterality: N/A;  . RIGHT/LEFT HEART CATH AND CORONARY ANGIOGRAPHY N/A 03/02/2020   Procedure: RIGHT/LEFT HEART CATH AND CORONARY ANGIOGRAPHY;  Surgeon: Laurey Morale, MD;  Location: [facility] INVASIVE CV LAB;  Service: Cardiovascular;  Laterality: N/A;  . TEE WITHOUT CARDIOVERSION N/A 03/29/2018   Procedure: TRANSESOPHAGEAL ECHOCARDIOGRAM (TEE);  Surgeon: Laurey Morale, MD;  Location: [facility] ENDOSCOPY;  Service: Cardiovascular;  Laterality: N/A;  . TEE WITHOUT CARDIOVERSION N/A 04/19/2018   Procedure: TRANSESOPHAGEAL ECHOCARDIOGRAM (TEE);  Surgeon: Laurey Morale, MD;  Location: [facility] ENDOSCOPY;  Service: Cardiovascular;  Laterality: N/A;  . TEE WITHOUT CARDIOVERSION N/A 03/02/2020   Procedure: TRANSESOPHAGEAL ECHOCARDIOGRAM (TEE);  Surgeon: Laurey Morale, MD;  Location: [facility] ENDOSCOPY;  Service: Cardiovascular;  Laterality: N/A;  . TONSILLECTOMY AND ADENOIDECTOMY  1947  . TRANSTHORACIC ECHOCARDIOGRAM  02/2016   Mild LVH. Moderately reduced EF of 35-40% with diffuse hypokinesis. Indeterminate diastolic  function. Likely elevated LA pressures. Mild aortic stenosis (mean gradient 10 mmHg). Mild MR. Mild LA dilation. Mild to moderate PA hypertension with 39 mmHg  . VIDEO ASSISTED THORACOSCOPY (VATS)/THOROCOTOMY Right 01/24/2015   Procedure: VIDEO ASSISTED THORACOSCOPY (VATS) FOR DRAINAGE OF RIGHT HEMOTHORAX;  Surgeon: Alleen Borne, MD;  Location: [facility] OR;  Service: Thoracic;  Laterality: Right;       Family History  Problem Relation Age of Onset  . CAD Neg Hx     Social History   Tobacco Use  . Smoking status: Never Smoker  . Smokeless tobacco: Never Used  Vaping Use  . Vaping Use: Never used  Substance Use Topics  . Alcohol use: Yes    Comment: rare use now  . Drug use: No    Home Medications Prior to Admission medications   Medication Sig Start Date End Date Taking? Authorizing Provider  acetaminophen (TYLENOL) 500 MG tablet Take 1,000 mg by mouth every 8 (eight) hours as needed (for pain or headaches).   Yes [provider]  ADVAIR DISKUS 100-50 MCG/DOSE AEPB INHALE 1 PUFF INTO THE LUNGS TWICE DAILY Patient taking differently: Inhale 1 puff into the lungs 2 (two) times daily as needed (shortness  of breath/wheezing). 07/05/20  Yes Saguier, Ramon Dredge, PA-C  amiodarone (PACERONE) 200 MG tablet Take 1 tablet (200 mg total) by mouth 2 (two) times daily. 07/22/20  Yes Milford, Anderson Malta, FNP  atorvastatin (LIPITOR) 80 MG tablet Take 1 tablet (80 mg total) by mouth daily. 02/17/20 03/19/20 Yes Laurey Morale, MD  carvedilol (COREG) 12.5 MG tablet Take 1.5 tablets (18.75 mg total) by mouth 2 (two) times daily with a meal. 05/05/20  Yes Laurey Morale, MD  clopidogrel (PLAVIX) 75 MG tablet Take 1 tablet (75 mg total) by mouth daily. 06/03/20 06/03/21 Yes Laurey Morale, MD  ELIQUIS 5 MG TABS tablet TAKE 1 TABLET(5 MG) BY MOUTH TWICE DAILY Patient taking differently: Take 5 mg by mouth 2 (two) times daily. 05/25/20  Yes Laurey Morale, MD  empagliflozin (JARDIANCE) 10 MG TABS tablet  Take 1 tablet (10 mg total) by mouth daily before breakfast. 07/22/20  Yes Clegg, Amy D, NP  ENTRESTO 49-51 MG TAKE 1 TABLET BY MOUTH TWICE DAILY Patient taking differently: Take 1 tablet by mouth 2 (two) times daily. 08/09/20  Yes Laurey Morale, MD  ezetimibe (ZETIA) 10 MG tablet Take 1 tablet (10 mg total) by mouth daily. 06/17/20  Yes Laurey Morale, MD  glipiZIDE (GLUCOTROL) 5 MG tablet Take 1 tablet (5 mg total) by mouth daily before breakfast. 02/24/20  Yes Shamleffer, Konrad Dolores, MD  icosapent Ethyl (VASCEPA) 1 g capsule Take 2 capsules (2 g total) by mouth 2 (two) times daily. 06/04/20  Yes Laurey Morale, MD  metFORMIN (GLUCOPHAGE) 1000 MG tablet Take 1 tablet (1,000 mg total) by mouth daily with breakfast. 03/04/20  Yes Laurey Morale, MD  nitroGLYCERIN (NITROSTAT) 0.4 MG SL tablet Place 1 tablet (0.4 mg total) under the tongue every 5 (five) minutes as needed for chest pain. 12/09/19  Yes Amin, Loura Halt, MD  polyvinyl alcohol (ARTIFICIAL TEARS) 1.4 % ophthalmic solution Place 1 drop into both eyes daily as needed for dry eyes (irritation).   Yes [provider]  ranolazine (RANEXA) 500 MG 12 hr tablet Take 1 tablet (500 mg total) by mouth 2 (two) times daily. 07/08/20  Yes Laurey Morale, MD  torsemide (DEMADEX) 20 MG tablet Take 2 tablets (40 mg total) by mouth in the morning. 08/05/20  Yes Clegg, Amy D, NP  cholecalciferol (VITAMIN D) 1000 units tablet Take 1 tablet (1,000 Units total) by mouth daily. Patient not taking: Reported on 08/09/2020 12/09/19   Dimple Nanas, MD    Allergies    Patient has no known allergies.  Review of Systems   Review of Systems  Constitutional: Negative for fever.  HENT: Negative for ear pain and sore throat.   Eyes: Negative for pain.  Respiratory: Positive for shortness of breath. Negative for cough.   Cardiovascular: Negative for chest pain.  Gastrointestinal: Negative for abdominal pain.  Genitourinary: Negative for  flank pain.  Musculoskeletal: Negative for back pain.  Skin: Negative for color change and rash.  Neurological: Negative for syncope.  All other systems reviewed and are negative.   Physical Exam Updated Vital Signs BP (!) 137/97   Pulse (!) 118   Resp (!) 22   SpO2 91%   Physical Exam Constitutional:      General: He is not in acute distress.    Appearance: He is well-developed.  HENT:     Head: Normocephalic.     Nose: Nose normal.  Eyes:     Extraocular Movements: Extraocular  movements intact.  Cardiovascular:     Rate and Rhythm: Regular rhythm. Tachycardia present.  Pulmonary:     Effort: Pulmonary effort is normal. No respiratory distress.     Breath sounds: No stridor.  Skin:    Coloration: Skin is not jaundiced.  Neurological:     General: No focal deficit present.     Mental Status: He is alert. Mental status is at baseline.     ED Results / Procedures / Treatments   Labs (all labs ordered are listed, but only abnormal results are displayed) Labs Reviewed  CBC WITH DIFFERENTIAL/PLATELET - Abnormal; Notable for the following components:      Result Value   WBC 10.7 (*)    RBC 3.66 (*)    Hemoglobin 11.5 (*)    HCT 35.0 (*)    Neutro Abs 8.6 (*)    Abs Immature Granulocytes 0.08 (*)    All other components within normal limits  BRAIN NATRIURETIC PEPTIDE - Abnormal; Notable for the following components:   B Natriuretic Peptide 339.4 (*)    All other components within normal limits  BETA-HYDROXYBUTYRIC ACID - Abnormal; Notable for the following components:   Beta-Hydroxybutyric Acid 0.48 (*)    All other components within normal limits  BASIC METABOLIC PANEL - Abnormal; Notable for the following components:   Glucose, Bld 187 (*)    Creatinine, Ser 1.73 (*)    Calcium 8.3 (*)    GFR, Estimated 40 (*)    All other components within normal limits  RESP PANEL BY RT-PCR (FLU A&B, COVID) ARPGX2  CULTURE, BLOOD (ROUTINE X 2)  CULTURE, BLOOD (ROUTINE X 2)   PROCALCITONIN  BASIC METABOLIC PANEL  CBC  CBG MONITORING, ED  TROPONIN I (HIGH SENSITIVITY)  TROPONIN I (HIGH SENSITIVITY)    EKG EKG Interpretation  Date/Time:  Monday August 09 2020 16:21:24 EDT Ventricular Rate:  120 PR Interval:    QRS Duration: 121 QT Interval:  362 QTC Calculation: 512 R Axis:   82 Text Interpretation: Sinus tachycardia Nonspecific intraventricular conduction delay Probable anteroseptal infarct, old Confirmed by Norman Clay ([id number]) on 08/09/2020 4:35:46 PM   Radiology DG Chest Port 1 View  Result Date: 08/09/2020 CLINICAL DATA:  Shortness of breath EXAM: PORTABLE CHEST 1 VIEW COMPARISON:  12/17/2019, CT 02/11/2016 FINDINGS: Cardiomegaly with vascular congestion. Small bilateral pleural effusions. Patchy airspace disease in the right greater than left lung base. No pneumothorax. IMPRESSION: Cardiomegaly with vascular congestion and small bilateral pleural effusions. Patchy airspace disease at the right greater than left lung base, suspicious for pneumonia Electronically Signed   By: Jasmine Pang M.D.   On: 08/09/2020 17:46    Procedures Procedures   Medications Ordered in ED Medications  furosemide (LASIX) injection 80 mg (has no administration in time range)  amiodarone (PACERONE) tablet 200 mg (200 mg Oral Given 08/09/20 2218)  carvedilol (COREG) tablet 18.75 mg (has no administration in time range)  ranolazine (RANEXA) 12 hr tablet 500 mg (has no administration in time range)  clopidogrel (PLAVIX) tablet 75 mg (has no administration in time range)  apixaban (ELIQUIS) tablet 5 mg (5 mg Oral Given 08/09/20 2218)  cholecalciferol (VITAMIN D3) tablet 1,000 Units (has no administration in time range)  mometasone-formoterol (DULERA) 100-5 MCG/ACT inhaler 2 puff (has no administration in time range)  insulin aspart (novoLOG) injection 0-5 Units (0 Units Subcutaneous Not Given 08/09/20 2223)  insulin aspart (novoLOG) injection 0-9 Units (has no administration  in time range)  sodium chloride 0.9 % bolus  500 mL (0 mLs Intravenous Stopped 08/09/20 1959)  cefTRIAXone (ROCEPHIN) 1 g in sodium chloride 0.9 % 100 mL IVPB (0 g Intravenous Stopped 08/09/20 2112)  azithromycin (ZITHROMAX) 500 mg in sodium chloride 0.9 % 250 mL IVPB (0 mg Intravenous Stopped 08/09/20 2135)    ED Course  I have reviewed the triage vital signs and the nursing notes.  Pertinent labs & imaging results that were available during my care of the patient were reviewed by me and considered in my medical decision making (see chart for details).    MDM Rules/Calculators/A&P                          Patient with extensive cardiac history, presenting with mild tachycardia, EKG concerning for sinus tach versus atrial flutter.  Patient states he has a history of atrial fibrillation in the past is on Eliquis.  Chest x-ray concerning for pneumonia per radiologist.  proBNP is normal appearing at 300.  Empirically treated the patient for pneumonia and given IV antibiotics.  However procalcitonin has returned undetectable.  Patient remained to the hospitalist team for concern for pneumonia CHF exacerbation with shortness of breath and near syncope. Final Clinical Impression(s) / ED Diagnoses Final diagnoses:  Near syncope  Community acquired pneumonia, unspecified laterality    Rx / DC Orders ED Discharge Orders    None       Cheryll Cockayne, MD 08/09/20 2244

## 2020-08-09 NOTE — ED Notes (Signed)
RN placed pt on 2L of O 2 ?

## 2020-08-10 ENCOUNTER — Other Ambulatory Visit (HOSPITAL_COMMUNITY): Payer: Medicare Other

## 2020-08-10 DIAGNOSIS — I4892 Unspecified atrial flutter: Secondary | ICD-10-CM

## 2020-08-10 DIAGNOSIS — I959 Hypotension, unspecified: Secondary | ICD-10-CM | POA: Diagnosis not present

## 2020-08-10 DIAGNOSIS — J9601 Acute respiratory failure with hypoxia: Secondary | ICD-10-CM | POA: Diagnosis not present

## 2020-08-10 DIAGNOSIS — R55 Syncope and collapse: Secondary | ICD-10-CM | POA: Diagnosis not present

## 2020-08-10 DIAGNOSIS — N1832 Chronic kidney disease, stage 3b: Secondary | ICD-10-CM

## 2020-08-10 DIAGNOSIS — I5023 Acute on chronic systolic (congestive) heart failure: Secondary | ICD-10-CM | POA: Diagnosis not present

## 2020-08-10 DIAGNOSIS — I509 Heart failure, unspecified: Secondary | ICD-10-CM | POA: Insufficient documentation

## 2020-08-10 DIAGNOSIS — I480 Paroxysmal atrial fibrillation: Secondary | ICD-10-CM | POA: Diagnosis not present

## 2020-08-10 DIAGNOSIS — I5043 Acute on chronic combined systolic (congestive) and diastolic (congestive) heart failure: Secondary | ICD-10-CM | POA: Diagnosis not present

## 2020-08-10 LAB — CBG MONITORING, ED
Glucose-Capillary: 169 mg/dL — ABNORMAL HIGH (ref 70–99)
Glucose-Capillary: 174 mg/dL — ABNORMAL HIGH (ref 70–99)

## 2020-08-10 LAB — CBC
HCT: 35.4 % — ABNORMAL LOW (ref 39.0–52.0)
Hemoglobin: 11.8 g/dL — ABNORMAL LOW (ref 13.0–17.0)
MCH: 31.9 pg (ref 26.0–34.0)
MCHC: 33.3 g/dL (ref 30.0–36.0)
MCV: 95.7 fL (ref 80.0–100.0)
Platelets: 458 10*3/uL — ABNORMAL HIGH (ref 150–400)
RBC: 3.7 MIL/uL — ABNORMAL LOW (ref 4.22–5.81)
RDW: 12.7 % (ref 11.5–15.5)
WBC: 13.3 10*3/uL — ABNORMAL HIGH (ref 4.0–10.5)
nRBC: 0 % (ref 0.0–0.2)

## 2020-08-10 LAB — HEPATIC FUNCTION PANEL
ALT: 21 U/L (ref 0–44)
AST: 23 U/L (ref 15–41)
Albumin: 2.7 g/dL — ABNORMAL LOW (ref 3.5–5.0)
Alkaline Phosphatase: 64 U/L (ref 38–126)
Bilirubin, Direct: 0.1 mg/dL (ref 0.0–0.2)
Indirect Bilirubin: 0.7 mg/dL (ref 0.3–0.9)
Total Bilirubin: 0.8 mg/dL (ref 0.3–1.2)
Total Protein: 6.9 g/dL (ref 6.5–8.1)

## 2020-08-10 LAB — GLUCOSE, CAPILLARY
Glucose-Capillary: 153 mg/dL — ABNORMAL HIGH (ref 70–99)
Glucose-Capillary: 148 mg/dL — ABNORMAL HIGH (ref 70–99)

## 2020-08-10 LAB — BASIC METABOLIC PANEL
Anion gap: 11 (ref 5–15)
BUN: 17 mg/dL (ref 8–23)
CO2: 25 mmol/L (ref 22–32)
Calcium: 8.3 mg/dL — ABNORMAL LOW (ref 8.9–10.3)
Chloride: 105 mmol/L (ref 98–111)
Creatinine, Ser: 1.62 mg/dL — ABNORMAL HIGH (ref 0.61–1.24)
GFR, Estimated: 43 mL/min — ABNORMAL LOW (ref >60)
Glucose, Bld: 120 mg/dL — ABNORMAL HIGH (ref 70–99)
Potassium: 3.8 mmol/L (ref 3.5–5.1)
Sodium: 141 mmol/L (ref 135–145)

## 2020-08-10 MED ORDER — DILTIAZEM HCL-DEXTROSE 125-5 MG/125ML-% IV SOLN (PREMIX)
5.0000 mg/h | INTRAVENOUS | Status: DC
  Filled 2020-08-10: qty 125

## 2020-08-10 MED ORDER — EZETIMIBE 10 MG PO TABS
10.0000 mg | ORAL_TABLET | Freq: Every day | ORAL | Status: DC
  Administered 2020-08-10 – 2020-08-19 (×10): 10 mg via ORAL
  Filled 2020-08-10 (×12): qty 1

## 2020-08-10 MED ORDER — MAGNESIUM SULFATE 2 GM/50ML IV SOLN
2.0000 g | Freq: Once | INTRAVENOUS | Status: AC
  Administered 2020-08-10: 2 g via INTRAVENOUS
  Filled 2020-08-10: qty 50

## 2020-08-10 MED ORDER — AMIODARONE IV BOLUS ONLY 150 MG/100ML
150.0000 mg | Freq: Once | INTRAVENOUS | Status: AC
  Administered 2020-08-10: 150 mg via INTRAVENOUS
  Filled 2020-08-10: qty 100

## 2020-08-10 MED ORDER — POTASSIUM CHLORIDE CRYS ER 20 MEQ PO TBCR
40.0000 meq | EXTENDED_RELEASE_TABLET | Freq: Once | ORAL | Status: AC
  Administered 2020-08-10: 40 meq via ORAL
  Filled 2020-08-10: qty 2

## 2020-08-10 MED ORDER — CARVEDILOL 6.25 MG PO TABS
6.2500 mg | ORAL_TABLET | Freq: Two times a day (BID) | ORAL | Status: DC
  Administered 2020-08-11 – 2020-08-15 (×9): 6.25 mg via ORAL
  Filled 2020-08-10 (×12): qty 1

## 2020-08-10 MED ORDER — SODIUM CHLORIDE 0.9 % IV SOLN
INTRAVENOUS | Status: AC | PRN
  Administered 2020-08-10: 1000 mL via INTRAVENOUS

## 2020-08-10 MED ORDER — SACUBITRIL-VALSARTAN 24-26 MG PO TABS
1.0000 | ORAL_TABLET | Freq: Two times a day (BID) | ORAL | Status: DC
  Administered 2020-08-10 – 2020-08-11 (×3): 1 via ORAL
  Filled 2020-08-10 (×4): qty 1

## 2020-08-10 MED ORDER — AMIODARONE HCL IN DEXTROSE 360-4.14 MG/200ML-% IV SOLN
30.0000 mg/h | INTRAVENOUS | Status: DC
  Administered 2020-08-10: 60 mg/h via INTRAVENOUS
  Administered 2020-08-10 – 2020-08-11 (×2): 30 mg/h via INTRAVENOUS
  Administered 2020-08-11: 60 mg/h via INTRAVENOUS
  Administered 2020-08-12 – 2020-08-13 (×3): 30 mg/h via INTRAVENOUS
  Filled 2020-08-10 (×8): qty 200

## 2020-08-10 MED ORDER — ATORVASTATIN CALCIUM 80 MG PO TABS
80.0000 mg | ORAL_TABLET | Freq: Every day | ORAL | Status: DC
  Administered 2020-08-10 – 2020-08-19 (×10): 80 mg via ORAL
  Filled 2020-08-10 (×11): qty 1

## 2020-08-10 MED ORDER — ADENOSINE 6 MG/2ML IV SOLN
6.0000 mg | Freq: Once | INTRAVENOUS | Status: AC
  Administered 2020-08-10: 6 mg via INTRAVENOUS
  Filled 2020-08-10: qty 2

## 2020-08-10 MED ORDER — DILTIAZEM LOAD VIA INFUSION
20.0000 mg | Freq: Once | INTRAVENOUS | Status: DC
  Filled 2020-08-10: qty 20

## 2020-08-10 MED ORDER — ORAL CARE MOUTH RINSE
15.0000 mL | Freq: Two times a day (BID) | OROMUCOSAL | Status: DC
  Administered 2020-08-11 – 2020-08-19 (×16): 15 mL via OROMUCOSAL

## 2020-08-10 MED ORDER — CARVEDILOL 3.125 MG PO TABS
3.1250 mg | ORAL_TABLET | Freq: Two times a day (BID) | ORAL | Status: DC
  Filled 2020-08-10: qty 1

## 2020-08-10 MED ORDER — AMIODARONE HCL IN DEXTROSE 360-4.14 MG/200ML-% IV SOLN
60.0000 mg/h | INTRAVENOUS | Status: AC
  Administered 2020-08-10: 60 mg/h via INTRAVENOUS
  Filled 2020-08-10: qty 200

## 2020-08-10 MED ORDER — ADENOSINE 6 MG/2ML IV SOLN
INTRAVENOUS | Status: AC | PRN
  Administered 2020-08-10: 12 mg via INTRAVENOUS

## 2020-08-10 NOTE — Consult Note (Signed)
NAME:  David Huynh, MRN:  [id number], DOB:  April 27, 1941, LOS: 1 ADMISSION DATE:  08/09/2020, CONSULTATION DATE: 08/10/2020 REFERRING MD:  Dr Hanley Ben, CHIEF COMPLAINT:  Cant breath  History of Present Illness:  This is a 80y/o male that presented to the ER with SOB and light headedness.  Patient has a known history of atrial fibrillation and has had a cardioversion in the recent past.  Patient was previously on amiodarone and had been seen in his cardiologist office on 07/22/2020.  At that time he was found to be back in A. fib therefore the amiodarone was increased.  Critical care medicine was called today for heart rate of 200 and hypotension.  Adenosine 6 mg and 12 mg have been attempted prior to arrival.  Patient describes severe shortness of breath and a heaviness causing him not to be able to take a breath in.  He had also has +4 edema of the lower extremities.  Pertinent  Medical History  Congestive heart failure-chronic systolic and diastolic CKD Multivessel CAD Atrial fibrillation Asthma Diabetes mellitus type 2   Significant Hospital Events: Including procedures, antibiotic start and stop dates in addition to other pertinent events   .   Interim History / Subjective:  Patient developed A. fib RVR with a heart rate of 200 while in the emergency room.  Objective   Blood pressure 104/64, pulse 70, resp. rate (!) 26, SpO2 97 %.        Intake/Output Summary (Last 24 hours) at 08/10/2020 0649 Last data filed at 08/10/2020 3875 Gross per 24 hour  Intake 2713.95 ml  Output --  Net 2713.95 ml   There were no vitals filed for this visit.  Examination: Exam during heart rate of 200 General: Moderate respiratory distress HENT: Atraumatic/normocephalic mucous membranes are moist Lungs: Mild rales no wheezing or rhonchi Cardiovascular: Very tachycardic Abdomen: Soft, nontender, nondistended, no rebound/rigidity/guarding. Extremities: +4 edema of the lower extremities distal  pulse intact x4.  No cyanosis. Neuro: Patient is awake and alert answering questions appropriately.  No focal motor deficits   Labs/imaging that I havepersonally reviewed  (right click and "Reselect all SmartList Selections" daily)    Resolved Hospital Problem list   None  Assessment & Plan:  Atrial fibrillation with RVR Acute on chronic congestive heart failure +4 pitting edema of the lower extremities Multivessel CAD  Plan: 150 mg of amiodarone IV bolus was given.  Patient immediately started to convert to a rate controlled atrial fibrillation.  Heart rate went from 200 to 140.  Recommended giving 2 mg of magnesium sulfate IV.  As this was being given patient converted to sinus rhythm.  Patient symptoms have now resolved and states he has not felt this good in the last 3 days.  Blood pressure is also immediately recovered with systolic blood pressures in the 100s. Recommend the patient be admitted to the cardiac floor. Amiodarone infusion initiated. Recommend cardiology consult. Patient does not require ICU level of care at this time but CCM is more than willing to reevaluate at any point in the future if the patient is to become unstable again.  Labs   CBC: Recent Labs  Lab 08/09/20 1643 08/10/20 0541  WBC 10.7* 13.3*  NEUTROABS 8.6*  --   HGB 11.5* 11.8*  HCT 35.0* 35.4*  MCV 95.6 95.7  PLT 394 458*    Basic Metabolic Panel: Recent Labs  Lab 08/09/20 1800 08/10/20 0541  NA 137 141  K 4.2 3.8  CL 105 105  CO2 23 25  GLUCOSE 187* 120*  BUN 21 17  CREATININE 1.73* 1.62*  CALCIUM 8.3* 8.3*   GFR: CrCl cannot be calculated (Unknown ideal weight.). Recent Labs  Lab 08/09/20 1643 08/09/20 1800 08/10/20 0541  PROCALCITON  --  <0.10  --   WBC 10.7*  --  13.3*    Liver Function Tests: No results for input(s): AST, ALT, ALKPHOS, BILITOT, PROT, ALBUMIN in the last 168 hours. No results for input(s): LIPASE, AMYLASE in the last 168 hours. No results for  input(s): AMMONIA in the last 168 hours.  ABG    Component Value Date/Time   PHART 7.434 01/25/2015 0350   PCO2ART 40.9 01/25/2015 0350   PO2ART 81.0 01/25/2015 0350   HCO3 28.7 (H) 03/02/2020 1034   HCO3 29.4 (H) 03/02/2020 1034   TCO2 30 03/02/2020 1034   TCO2 31 03/02/2020 1034   ACIDBASEDEF 1.0 01/24/2015 0826   O2SAT 70.0 03/02/2020 1034   O2SAT 71.0 03/02/2020 1034     Coagulation Profile: No results for input(s): INR, PROTIME in the last 168 hours.  Cardiac Enzymes: No results for input(s): CKTOTAL, CKMB, CKMBINDEX, TROPONINI in the last 168 hours.  HbA1C: Hemoglobin A1C  Date/Time Value Ref Range Status  05/25/2020 03:14 PM 6.5 (A) 4.0 - 5.6 % Final  02/24/2020 03:20 PM 6.8 (A) 4.0 - 5.6 % Final   Hgb A1c MFr Bld  Date/Time Value Ref Range Status  12/07/2019 05:26 AM 9.1 (H) 4.8 - 5.6 % Final    Comment:    (NOTE) Pre diabetes:          5.7%-6.4%  Diabetes:              >6.4%  Glycemic control for   <7.0% adults with diabetes   12/29/2016 10:33 AM 8.8 (H) 4.8 - 5.6 % Final    Comment:             Prediabetes: 5.7 - 6.4          Diabetes: >6.4          Glycemic control for adults with diabetes: <7.0     CBG: Recent Labs  Lab 08/09/20 2221  GLUCAP 99    Review of Systems:   See HPI  Past Medical History:  He,  has a past medical history of Allergy, Asthma, Atrial fibrillation with rapid ventricular response (HCC) (03/25/2018), CAD, multiple vessel, Carotid bruit (08/05/2010), Chronic combined systolic and diastolic CHF (congestive heart failure) (HCC), CKD (chronic kidney disease), stage III (HCC), Diabetes mellitus, Fracture of multiple ribs (02/12/2015), Hemothorax on right (01/23/2015), Hyperlipidemia, Hypertension, Hypocalcemia (02/12/2015), Lactic acidosis (03/25/2018), Liver function study, abnormal (04/26/2011), Multiple rib fractures, Obesity, and Persistent atrial fibrillation (HCC).   Surgical History:   Past Surgical History:  Procedure  Laterality Date  . APPENDECTOMY  1975  . CARDIAC CATHETERIZATION N/A 02/17/2016   Procedure: Left Heart Cath and Coronary Angiography;  Surgeon: Marykay Lex, MD;  Location: [facility] INVASIVE CV LAB;  Service: Cardiovascular: mRCA 85% (PCI), branch of OM2 80%,  ostD1 90%; EF 35-45%  . CARDIAC CATHETERIZATION N/A 02/17/2016   Procedure: Coronary Stent Intervention;  Surgeon: Marykay Lex, MD;  Location: [facility] INVASIVE CV LAB;  Service: Cardiovascular: mRCA PCI : STENT PROMUS PREM MR 3.5X28 DES  . CARDIOVERSION N/A 03/29/2018   Procedure: CARDIOVERSION;  Surgeon: Laurey Morale, MD;  Location: [facility] ENDOSCOPY;  Service: Cardiovascular;  Laterality: N/A;  . CARDIOVERSION N/A 04/02/2018   Procedure: CARDIOVERSION;  Surgeon: Laurey Morale, MD;  Location: [facility] ENDOSCOPY;  Service: Cardiovascular;  Laterality: N/A;  . CARDIOVERSION N/A 04/19/2018   Procedure: CARDIOVERSION;  Surgeon: Laurey Morale, MD;  Location: [facility] ENDOSCOPY;  Service: Cardiovascular;  Laterality: N/A;  . CATARACT EXTRACTION  2010   b/l  . CORONARY STENT INTERVENTION N/A 04/03/2018   Procedure: CORONARY STENT INTERVENTION;  Surgeon: Lennette Bihari, MD;  Location: [facility] INVASIVE CV LAB;  Service: Cardiovascular;  Laterality: N/A;  . CORONARY STENT INTERVENTION N/A 03/02/2020   Procedure: CORONARY STENT INTERVENTION;  Surgeon: Corky Crafts, MD;  Location: [facility] INVASIVE CV LAB;  Service: Cardiovascular;  Laterality: N/A;  . INTRAVASCULAR ULTRASOUND/IVUS N/A 03/02/2020   Procedure: Intravascular Ultrasound/IVUS;  Surgeon: Corky Crafts, MD;  Location: [facility] INVASIVE CV LAB;  Service: Cardiovascular;  Laterality: N/A;  . RETINAL LASER PROCEDURE  1985  . RIGHT/LEFT HEART CATH AND CORONARY ANGIOGRAPHY N/A 04/03/2018   Procedure: RIGHT/LEFT HEART CATH AND CORONARY ANGIOGRAPHY;  Surgeon: Laurey Morale, MD;  Location: [facility] INVASIVE CV LAB;  Service: Cardiovascular;  Laterality: N/A;  . RIGHT/LEFT HEART CATH AND CORONARY ANGIOGRAPHY N/A  03/02/2020   Procedure: RIGHT/LEFT HEART CATH AND CORONARY ANGIOGRAPHY;  Surgeon: Laurey Morale, MD;  Location: [facility] INVASIVE CV LAB;  Service: Cardiovascular;  Laterality: N/A;  . TEE WITHOUT CARDIOVERSION N/A 03/29/2018   Procedure: TRANSESOPHAGEAL ECHOCARDIOGRAM (TEE);  Surgeon: Laurey Morale, MD;  Location: [facility] ENDOSCOPY;  Service: Cardiovascular;  Laterality: N/A;  . TEE WITHOUT CARDIOVERSION N/A 04/19/2018   Procedure: TRANSESOPHAGEAL ECHOCARDIOGRAM (TEE);  Surgeon: Laurey Morale, MD;  Location: [facility] ENDOSCOPY;  Service: Cardiovascular;  Laterality: N/A;  . TEE WITHOUT CARDIOVERSION N/A 03/02/2020   Procedure: TRANSESOPHAGEAL ECHOCARDIOGRAM (TEE);  Surgeon: Laurey Morale, MD;  Location: [facility] ENDOSCOPY;  Service: Cardiovascular;  Laterality: N/A;  . TONSILLECTOMY AND ADENOIDECTOMY  1947  . TRANSTHORACIC ECHOCARDIOGRAM  02/2016   Mild LVH. Moderately reduced EF of 35-40% with diffuse hypokinesis. Indeterminate diastolic function. Likely elevated LA pressures. Mild aortic stenosis (mean gradient 10 mmHg). Mild MR. Mild LA dilation. Mild to moderate PA hypertension with 39 mmHg  . VIDEO ASSISTED THORACOSCOPY (VATS)/THOROCOTOMY Right 01/24/2015   Procedure: VIDEO ASSISTED THORACOSCOPY (VATS) FOR DRAINAGE OF RIGHT HEMOTHORAX;  Surgeon: Alleen Borne, MD;  Location: [facility] OR;  Service: Thoracic;  Laterality: Right;     Social History:   reports that he has never smoked. He has never used smokeless tobacco. He reports current alcohol use. He reports that he does not use drugs.   Family History:  His family history is negative for CAD.   Allergies No Known Allergies   Home Medications  Prior to Admission medications   Medication Sig Start Date End Date Taking? Authorizing Provider  acetaminophen (TYLENOL) 500 MG tablet Take 1,000 mg by mouth every 8 (eight) hours as needed (for pain or headaches).   Yes [provider]  ADVAIR DISKUS 100-50 MCG/DOSE AEPB INHALE 1 PUFF INTO THE  LUNGS TWICE DAILY Patient taking differently: Inhale 1 puff into the lungs 2 (two) times daily as needed (shortness of breath/wheezing). 07/05/20  Yes Saguier, Ramon Dredge, PA-C  amiodarone (PACERONE) 200 MG tablet Take 1 tablet (200 mg total) by mouth 2 (two) times daily. 07/22/20  Yes Milford, Anderson Malta, FNP  atorvastatin (LIPITOR) 80 MG tablet Take 1 tablet (80 mg total) by mouth daily. 02/17/20 03/19/20 Yes Laurey Morale, MD  carvedilol (COREG) 12.5 MG tablet Take 1.5 tablets (18.75 mg total) by mouth 2 (two) times daily with a meal. 05/05/20  Yes Shirlee Latch,  Dalton S, MD  clopidogrel (PLAVIX) 75 MG tablet Take 1 tablet (75 mg total) by mouth daily. 06/03/20 06/03/21 Yes McLean, Dalton S, MD  ELIQUIS 5 MG TABS tablet TAKE 1 TABLET(5 MG) BY MOUTH TWICE DAILY Patient taking differently: Take 5 mg by mouth 2 (two) times daily. 05/25/20  Yes McLean, Dalton S, MD  empagliflozin (JARDIANCE) 10 MG TABS tablet Take 1 tablet (10 mg total) by mouth daily before breakfast. 07/22/20  Yes Clegg, Amy D, NP  ENTRESTO 49-51 MG TAKE 1 TABLET BY MOUTH TWICE DAILY Patient taking differently: Take 1 tablet by mouth 2 (two) times daily. 08/09/20  Yes McLean, Dalton S, MD  ezetimibe (ZETIA) 10 MG tablet Take 1 tablet (10 mg total) by mouth daily. 06/17/20  Yes McLean, Dalton S, MD  glipiZIDE (GLUCOTROL) 5 MG tablet Take 1 tablet (5 mg total) by mouth daily before breakfast. 02/24/20  Yes Shamleffer, Ibtehal Jaralla, MD  icosapent Ethyl (VASCEPA) 1 g capsule Take 2 capsules (2 g total) by mouth 2 (two) times daily. 06/04/20  Yes McLean, Dalton S, MD  metFORMIN (GLUCOPHAGE) 1000 MG tablet Take 1 tablet (1,000 mg total) by mouth daily with breakfast. 03/04/20  Yes McLean, Dalton S, MD  nitroGLYCERIN (NITROSTAT) 0.4 MG SL tablet Place 1 tablet (0.4 mg total) under the tongue every 5 (five) minutes as needed for chest pain. 12/09/19  Yes Amin, Ankit Chirag, MD  polyvinyl alcohol (ARTIFICIAL TEARS) 1.4 % ophthalmic solution Place 1 drop into  both eyes daily as needed for dry eyes (irritation).   Yes [provider]  ranolazine (RANEXA) 500 MG 12 hr tablet Take 1 tablet (500 mg total) by mouth 2 (two) times daily. 07/08/20  Yes McLean, Dalton S, MD  torsemide (DEMADEX) 20 MG tablet Take 2 tablets (40 mg total) by mouth in the morning. 08/05/20  Yes Clegg, Amy D, NP  cholecalciferol (VITAMIN D) 1000 units tablet Take 1 tablet (1,000 Units total) by mouth daily. Patient not taking: Reported on 08/09/2020 12/09/19   Amin, Ankit Chirag, MD     Critical care time: <MEASUREMENLorine Be1Arta SilLehman [phone] Goo<[id number] [phone] FairMount Carmel Westm71 [place] DKizzi9811-91[place]Mar[place] Kitchen47Abran <BADTEXTTBjorn P Lorine Be10Arta SilLehman [phone] Goo<[id number] [phone] State HospiThe Center For Plastic And Reconstructive Surgeryt[address] SKizzi9811-91[place]iaMarland Kitchen47Abran <BADTEXTTBjorn P [place]ine Be3Arta SilLehman [phone] Goo<[id number] [phone] HospiLake Tahoe Surgery Centert[address] AvKizzi9811-91[place]iaMarland Kitchen47Abran <BADTEXTTBjorn Pippi Abran Duk<BADTEX Lorine Be7Arta SilLehman [phone] Goo<[id number] [phone] ArizOrthopaedic Hsptl Of Wio4 [place] SKizzi9811-91[place]iaMarland Kitchen47Abran <BADTEXTTBjorn P Lorine Be8Arta SilLehman [phone] Goo<[id number] [phone] Cen90210 [facility] LLCt7546 [place] DKizzi9811-91[place]iaMarland Kitchen47Abran <BADTEXTTBjorn P Lorine Be3Arta SilLehman [phone] Goo<[id number] [phone] CenHighland-Clarksburg Hospital Inct17 [place] DKizzi9811-91[place]iaMarland Kitchen47Abran <BADTEXTTBjorn P Lorine Be5Arta SilLehman [phone] Goo<[id number] [phone] ShLawrence Medical Centero12 [address] StreKizzi9811-91[place]iaMarland Kitchen47Abran <BADTEXTTBjorn P Lorine Be3Arta SilLehman [phone] Goo<[id number] [phone] CamEndoscopic Surgical Center Of Maryland Northp[address] DriKizzi9811-91[place]iaMarland Kitchen47Abran <BADTEXTTBjorn P Lorine Be8Arta SilLehman [phone] Goo<[id number] [phone] CenTempe St Luke'S Hospital[facility] Of St Luke'S Medical Centert7832 [place] RoKizzi9811-91[place]iaMarland Kitchen47Abran <BADTEXTTBjorn P<MEASUREMENArta SilLehman [phone] CenSaint Thomas Dekalb Hospitalt304 [place]9811<BADTEX Lorine Be6Arta SilLehman Brothe56700467Sampson Goo<[id number] [phone] HospiRiverwood Healthcare Centert20 [place] DKizzi9811-91[place]iaMarland Kitchen47Abran <BADTEXTTBjorn P Lorine Be5Arta SilLehman [phone] Goo<[id number] [phone] CenterL43 [place] DriKizzi9811-91[place]iaMarland Kitchen47Abran <BADTEXTTBjorn P Lorine Be6Arta SilLehman [phone] Goo<[id number] [phone] [place] [facility] CenSurgery Center Of Cor[facility] Gables LLCt[address] SKizzi9811-91[place]iaMarland Kitchen47Abran <BADTEXTTBjorn P Lorine Be6Arta SilLehman [phone] Goo<[id number] [phone] EClinica Espanola Inca16 [place] AvKizzi9811-91[place]iaMarland Kitchen47Abran <BADTEXTTBjorn P Lorine Be3Arta SilLehman [phone] Goo<[id number] [phone] HospiContinuecare Hospital At Medical Center Odessat34 [address] AvKizzi9811-91[place]iaMarland Kitchen47Abran <BADTEXTTBjorn P<MEASUREMENArta SilLehman [phone] HoDayton Va Medical Centers7441 Pierc9811<BADTEX Lorine Be3Arta SilLehman [phone] Goo<[id number] [phone] Medical CenSt. Elizabeth Hospitalt8986 [place] AvKizzi9811-91[place]iaMarland Kitchen47Abran <BADTEXTTBjorn P Lorine Be7Arta SilLehman [phone] Goo<[id number] [phone] SurgeryO[address] [place] AvenKizzi9811-91[place]iaMarland Kitchen47Abran <BADTEXTTBjorn P Lorine Be3Arta SilLehman [phone] Goo<[id number] [phone]-Parkway Campusa7427 [place] StreKizzi9811-91[place]iaMarland Kitchen47Abran <BADTEXTTBjorn Pippi Abran Duk<BADTEX Lorine Be3Arta SilLehman [phone] Goo<[id number] [phone] InstitBethesda Butler Hospitalu905 [place] SKizzi9811-91[place]iaMarland Kitchen47Abran <BADTEXTTBjorn P Lorine Be5Arta SilLehman [phone] Goo<[id number] [phone] DubNmc Surgery Center LP Dba The Surgery Center Of Nacogdocheso9857 [address] SKizzi9811-91[place]iaMarland Kitchen47Abran <BADTEXTTBjorn P Lorine Be5Arta SilLehman [phone] Goo<[id number] [phone] HealthL8325 [address] AvKizzi9811-91[place]iaMarland Kitchen47Abran <BADTEXTTBjorn P Lorine BeArta SilLehman [phone] Goo<[id number] [phone] CenBayside Ambulatory Center LLCt434 [place] DKizzi9811-91[place]iaMarland Kitchen47Abran <BADTEXTTBjorn P Lorine Be1Arta SilLehman [phone] Goo<[id number] [phone] Health SystKindred Hospital - Fort Worthe5 [place] RKizzi9811-91[place]iaMarland Kitchen47Abran <BADTEXTTBjorn P Lorine Be5Arta SilLehman [phone] Goo<[id number] [phone] Surgery CenterL7480 [place] SKizzi9811-91[place]iaMarland Kitchen47Abran <BADTEXTTBjorn P Lorine Be5Arta SilLehman [phone] Goo<[id number] [phone] HospiAlaska Va Healthcare Systemt9128 [place] StreKizzi9811-91[place]iaMarland Kitchen47Abran <BADTEXTTBjorn P Lorine BeArta SilLehman [phone] Goo<[id number] [phone] CenSaint Marys Hospitalt9714 [place] DriKizzi9811-91[place]iaMarland Kitchen47Abran <BADTEXTTBjorn P Lorine Be2Arta SilLehman [phone] 84Sampson Goo<[id number] [phone] [place] HospiTrinity Surgery Center LLC Dba Baycare Surgery Centert15 [place] SKizzi9811-91[place]iaMarland Kitchen47Abran <BADTEXTTBjorn Pippin

## 2020-08-10 NOTE — Progress Notes (Signed)
Cross-coverage note:   Patient was seen for tachyarrhythmia.   This is a 80 year old gentleman with history of CAD, CHF (EF 50% in October 2021), CKD 3, HTN, T2DM, and atrial fibrillation on Eliquis, presenting yesterday yesterday with 2 days of shortness of breath and lightheadedness with near syncope, found to be tachycardic in the 120s with acute hypoxic respiratory failure suspected secondary to CHF, and admitted to the hospitalist service.    Patient was being diuresed and heart rate remained in the 120 range until shortly before 6 AM when patient complained of increased shortness of breath and palpitations and was noted to have heart rate 220 with stable blood pressure.  EKG was obtained and the patient, vagal maneuvers were unsuccessful, there was no response to 6 mg IV adenosine, and HR then slowed to the 140s for only a few seconds after 12 mg adenosine.  Systolic pressure then dropped to the 70s, a 1 L saline bolus was given with improvement in SBP to low 100s. While preparing to start diltiazem, SBP dropped to the 70s again and CCM was paged.   Dr. Nicole Cella of CCM recommended amiodarone and came to the bedside. HR slowed to 150s and then 120 range after 150 mg IV amiodarone and 2 g IV magnesium was administered. BP improved and patient reported marked improvement in dyspnea. Plan to start amiodarone infusion now.

## 2020-08-10 NOTE — ED Notes (Signed)
Provider paged and notified regarding pts HR

## 2020-08-10 NOTE — Progress Notes (Signed)
Patient ID: David Huynh, male   DOB: April 27, 1941, 80 y.o.   MRN: [id number]  PROGRESS NOTE    David Huynh  CHY:[id number] DOB: August 08, 1940 DOA: 08/09/2020 PCP: Bradd Canary, MD   Brief Narrative:  80 year old male with history of CAD, chronic systolic CHF, PAF, hypertension, HLD, diabetes mellitus type 2, moderate AS, chronic kidney disease stage III, mild persistent asthma presented with worsening shortness of breath and syncope.  EMS found the patient to have atrial fibrillation/flutter with rates of 100 220 with some orthostatic changes.  In the ED, he had sinus tachycardia along with tachypnea and hypoxia with saturations down to 88% on room air for which she was placed on 2 L oxygen via nasal cannula.  Creatinine was 1.73 with prior baseline around 1.2-1.3.  BNP was 239.  Troponin was 7.  Chest x-ray showed cardiomegaly with small bilateral pleural effusions with patchy opacity in the right greater than left concerning for pneumonia.  Patient was given Rocephin and Zithromax in the ED.  Subsequently procalcitonin was less than 0.1 and hence antibiotics were not continued.  Patient was started on IV Lasix.  Early in the morning ox 08/10/2020 patient became tachycardic with heart rates going up to 220s.  Vagal maneuvers were unsuccessful and patient was given adenosine x2 with drop in blood pressure.  CCM was consulted.  He was started on amiodarone drip.  Assessment & Plan:   Proximal A. fib with RVR -Heart rate went to the 220s earlier this morning with no response to vagal maneuvers.  Treated with Adenosine x2 with drop in blood pressure.  CCM was consulted.  Patient was given IV amiodarone bolus and subsequently has been started on amiodarone drip.   -Heart rate is currently better but still slightly tachycardic.  I have spoken to Dr. Shirlee Latch who will see the patient in consultation  Acute combined systolic and diastolic heart failure Acute respiratory failure with hypoxia -His  torsemide was recently increased to 60 mg twice daily 2 weeks ago by cardiology -Presented with worsening shortness of breath requiring supplemental oxygen and chest x-ray showed bilateral pleural effusions.  Procalcitonin was less than 0.1: Antibiotics were subsequently not continued. -Strict input output.  Daily weights.  Fluid restriction.  Continue IV Lasix.  Follow cardiology recommendations -Continue Coreg.  Entresto on hold for now. -Currently on 4 L oxygen by nasal cannula.  Syncope with loss of consciousness -Possibly from tachyarrhythmia.  Monitor on telemetry.  Obtain echocardiogram.  Follow cardiology recommendations  Leukocytosis -Probably reactive.  Monitor  Thrombocytosis -Probably reactive.  Monitor  Chronic kidney disease stage IIIb -Creatinine stable.  Monitor  Anemia of chronic disease -Probably from kidney disease.  Hemoglobin stable.  Monitor  History of CAD with DES  -No chest pain.  Continue Coreg, Eliquis, Plavix and ranolazine  Diabetes mellitus type 2 -A1c was 6.52 months ago.  Continue CBGs with SSI  History of mild persistent asthma -Use bronchodilators as needed -Incentive spirometry  DVT prophylaxis: Eliquis Code Status: Full Family Communication: None at bedside Disposition Plan: Status is: Inpatient  Remains inpatient appropriate because:Inpatient level of care appropriate due to severity of illness   Dispo: The patient is from: Home              Anticipated d/c is to: Home              Patient currently is not medically stable to d/c.   Difficult to place patient No  Consultants: PCCM/cardiology  Procedures: None  Antimicrobials: None   Subjective: Patient seen and examined at bedside.  Poor historian.  Denies any current chest pain.  Feels slightly better.  No overnight fever or vomiting reported.  Objective: Vitals:   08/10/20 0930 08/10/20 0945 08/10/20 1000 08/10/20 1030  BP: 128/84 122/86 (!) 150/137 110/84  Pulse: (!)  111 (!) 115 (!) 114 (!) 113  Resp: (!) 21   (!) 26  SpO2: 92% 93% 90% (!) 88%    Intake/Output Summary (Last 24 hours) at 08/10/2020 1048 Last data filed at 08/10/2020 7616 Gross per 24 hour  Intake 2763.95 ml  Output --  Net 2763.95 ml   There were no vitals filed for this visit.  Examination:  General exam: Looks chronically ill.  Elderly male lying in bed.  Currently on 4 L oxygen via nasal cannula. Respiratory system: Bilateral decreased breath sounds at bases with scattered crackles and intermittently tachypneic Cardiovascular system: S1 & S2 heard, tachycardic Gastrointestinal system: Abdomen is nondistended, soft and nontender. Normal bowel sounds heard. Extremities: No cyanosis, clubbing; lower extremity edema present Central nervous system: Sleepy, wakes up slightly and answers a few questions.  Slow to respond.  Poor historian.. No focal neurological deficits. Moving extremities Skin: No rashes, lesions or ulcers Psychiatry: Flat affect   Data Reviewed: I have personally reviewed following labs and imaging studies  CBC: Recent Labs  Lab 08/09/20 1643 08/10/20 0541  WBC 10.7* 13.3*  NEUTROABS 8.6*  --   HGB 11.5* 11.8*  HCT 35.0* 35.4*  MCV 95.6 95.7  PLT 394 458*   Basic Metabolic Panel: Recent Labs  Lab 08/09/20 1800 08/10/20 0541  NA 137 141  K 4.2 3.8  CL 105 105  CO2 23 25  GLUCOSE 187* 120*  BUN 21 17  CREATININE 1.73* 1.62*  CALCIUM 8.3* 8.3*   GFR: CrCl cannot be calculated (Unknown ideal weight.). Liver Function Tests: No results for input(s): AST, ALT, ALKPHOS, BILITOT, PROT, ALBUMIN in the last 168 hours. No results for input(s): LIPASE, AMYLASE in the last 168 hours. No results for input(s): AMMONIA in the last 168 hours. Coagulation Profile: No results for input(s): INR, PROTIME in the last 168 hours. Cardiac Enzymes: No results for input(s): CKTOTAL, CKMB, CKMBINDEX, TROPONINI in the last 168 hours. BNP (last 3 results) Recent Labs     12/17/19 1327  PROBNP 593.0*   HbA1C: No results for input(s): HGBA1C in the last 72 hours. CBG: Recent Labs  Lab 08/09/20 2221 08/10/20 0759  GLUCAP 99 169*   Lipid Profile: No results for input(s): CHOL, HDL, LDLCALC, TRIG, CHOLHDL, LDLDIRECT in the last 72 hours. Thyroid Function Tests: No results for input(s): TSH, T4TOTAL, FREET4, T3FREE, THYROIDAB in the last 72 hours. Anemia Panel: No results for input(s): VITAMINB12, FOLATE, FERRITIN, TIBC, IRON, RETICCTPCT in the last 72 hours. Sepsis Labs: Recent Labs  Lab 08/09/20 1800  PROCALCITON <0.10    Recent Results (from the past 240 hour(s))  Resp Panel by RT-PCR (Flu A&B, Covid) Nasopharyngeal Swab     Status: None   Collection Time: 08/09/20  5:09 PM   Specimen: Nasopharyngeal Swab; Nasopharyngeal(NP) swabs in vial transport medium  Result Value Ref Range Status   SARS Coronavirus 2 by RT PCR NEGATIVE NEGATIVE Final    Comment: (NOTE) SARS-CoV-2 target nucleic acids are NOT DETECTED.  The SARS-CoV-2 RNA is generally detectable in upper respiratory specimens during the acute phase of infection. The lowest concentration of SARS-CoV-2 viral copies this assay can detect is 138 copies/mL. A negative  result does not preclude SARS-Cov-2 infection and should not be used as the sole basis for treatment or other patient management decisions. A negative result may occur with  improper specimen collection/handling, submission of specimen other than nasopharyngeal swab, presence of viral mutation(s) within the areas targeted by this assay, and inadequate number of viral copies(<138 copies/mL). A negative result must be combined with clinical observations, patient history, and epidemiological information. The expected result is Negative.  Fact Sheet for Patients:  BloggerCourse.com  Fact Sheet for Healthcare Providers:  SeriousBroker.it  This test is no t yet approved or  cleared by the [place] FDA and  has been authorized for detection and/or diagnosis of SARS-CoV-2 by FDA under an Emergency Use Authorization (EUA). This EUA will remain  in effect (meaning this test can be used) for the duration of the COVID-19 declaration under Section 564(b)(1) of the Act, 21 U.S.C.section 360bbb-3(b)(1), unless the authorization is terminated  or revoked sooner.       Influenza A by PCR NEGATIVE NEGATIVE Final   Influenza B by PCR NEGATIVE NEGATIVE Final    Comment: (NOTE) The Xpert Xpress SARS-CoV-2/FLU/RSV plus assay is intended as an aid in the diagnosis of influenza from Nasopharyngeal swab specimens and should not be used as a sole basis for treatment. Nasal washings and aspirates are unacceptable for Xpert Xpress SARS-CoV-2/FLU/RSV testing.  Fact Sheet for Patients: BloggerCourse.com  Fact Sheet for Healthcare Providers: SeriousBroker.it  This test is not yet approved or cleared by the [place] FDA and has been authorized for detection and/or diagnosis of SARS-CoV-2 by FDA under an Emergency Use Authorization (EUA). This EUA will remain in effect (meaning this test can be used) for the duration of the COVID-19 declaration under Section 564(b)(1) of the Act, 21 U.S.C. section 360bbb-3(b)(1), unless the authorization is terminated or revoked.  Performed at [facility], [address] [place], [place] [address]   Culture, blood (routine x 2)     Status: None (Preliminary result)   Collection Time: 08/09/20  8:15 PM   Specimen: BLOOD  Result Value Ref Range Status   Specimen Description BLOOD LEFT ANTECUBITAL  Final   Special Requests   Final    BOTTLES DRAWN AEROBIC AND ANAEROBIC Blood Culture adequate volume   Culture   Final    NO GROWTH < 12 HOURS Performed at [facility], [address], [place], [place] [address]    Report Status PENDING  Incomplete  Culture, blood  (routine x 2)     Status: None (Preliminary result)   Collection Time: 08/09/20  8:20 PM   Specimen: BLOOD  Result Value Ref Range Status   Specimen Description BLOOD RIGHT ANTECUBITAL  Final   Special Requests   Final    BOTTLES DRAWN AEROBIC AND ANAEROBIC Blood Culture adequate volume   Culture   Final    NO GROWTH < 12 HOURS Performed at [facility], [address] [place], [place] [address]    Report Status PENDING  Incomplete         Radiology Studies: DG Chest Port 1 View  Result Date: 08/09/2020 CLINICAL DATA:  Shortness of breath EXAM: PORTABLE CHEST 1 VIEW COMPARISON:  12/17/2019, CT 02/11/2016 FINDINGS: Cardiomegaly with vascular congestion. Small bilateral pleural effusions. Patchy airspace disease in the right greater than left lung base. No pneumothorax. IMPRESSION: Cardiomegaly with vascular congestion and small bilateral pleural effusions. Patchy airspace disease at the right greater than left lung base, suspicious for  pneumonia Electronically Signed   By: Jasmine Pang M.D.   On: 08/09/2020 17:46        Scheduled Meds: . amiodarone  200 mg Oral BID  . apixaban  5 mg Oral BID  . carvedilol  18.75 mg Oral BID WC  . cholecalciferol  1,000 Units Oral Daily  . clopidogrel  75 mg Oral Daily  . furosemide  80 mg Intravenous BID  . insulin aspart  0-5 Units Subcutaneous QHS  . insulin aspart  0-9 Units Subcutaneous TID WC  . mometasone-formoterol  2 puff Inhalation BID  . ranolazine  500 mg Oral BID   Continuous Infusions: . amiodarone 60 mg/hr (08/10/20 0702)  . amiodarone            Glade Lloyd, MD [facility] 08/10/2020, 10:48 AM

## 2020-08-10 NOTE — Plan of Care (Signed)

## 2020-08-10 NOTE — ED Notes (Signed)
Pt bed soiled from urine and sweat.  Conducted a full linen change, changed pt's gown, and placed a new condom catheter on the pt.  Pt is now resting with call bell within reach.

## 2020-08-10 NOTE — ED Notes (Signed)
Primary RN and this RN at the bedside awaiting hospitalist for adenosine admin. Pt currently in SVT. Pads placed on pt and crash cart brought to bedside.

## 2020-08-10 NOTE — ED Notes (Signed)
Pt noted to have a HR in 200's. Pt states "my heart feels like it's racing". EKG done; hospitalist paged. Pt reports that we are causing his fast HR and he wants to get out of the hospital. Therapeutically communicated to pt that the staff is only here to assist him and help him get well. Pt angry at this time stating he wants to go home this morning.

## 2020-08-10 NOTE — ED Notes (Signed)
6mg adenosine given

## 2020-08-10 NOTE — Consult Note (Addendum)
Advanced Heart Failure Team Consult Note   Primary Physician: Bradd Canary, MD PCP-Cardiologist:  [name], MD  Reason for Consultation: A/C Systolic Heart Failure   HPI:    David Huynh is seen today for evaluation of A/C systolic heart failure at the request of Dr Hanley Ben.   David Huynh is a 80 year old with history of  CAD, chronic systolic CHF, HTN, HLD, DM, CKD 3, PAF, and asthma. Patient had EF 35-40% in 9/17, had cath with DES to RCA then repeat echo with EF up to 55-60% by 3/18.   Admitted 03/2018 with SOB x 3 weeks. No cardiology follow up in >1 year. Found to be in atrial fibrillation with RVR. EF down to 35-40%. Poor diuresis with IV lasix, so HF team consulted. He was started on amiodarone drip. PICC line placed with CVP markedly elevated and Coox marginal. Held off on inotropes with concern for making afib worse. Started on lasix drip. He started eliquis and had TEE/DCCV 03/29/18. Was converted to NSR, but shortly after went back into AF. Ranexa added. Attempted repeat DCCV 04/02/18, but was unsuccessful. EP consulted and recommended repeat LHC prior to repeat DCCV. Had [facility] 04/03/18 with PCI to proximal LCx.  He then had TEE-DCCV on 04/19/18 back to NSR. EF on TEE was 40%.  He saw Dr. Johney Frame to discuss ablation and opted to continue amiodarone.   TEE was done in 10/21 for closer evaluation of aortic valve, this showed EF actually up to 50% with moderate AS and moderate David.  LHC/RHC was done, showing normal filling pressures and significant LAD disease.  The patient is now s/p DES to LAD.   He returned to HF clinic 07/22/20 and was back in A fib + volume overloaded. Amiodarone and torsemide increased. He was to return back to the clinic this week but had syncopal episode at home.   A couple days prior to admit he developed increased shortness of breath. On 3/28 he developed dizziness and palpitations then had syncopal episode. His wife call EMS. EKG showed A fib/flutter  and was also orthostatic. In the ED he was given IV fluids and given rocephin + azithromycin for possible CAP. Pro calcitonin was not elevated.   Earlier this morning he was in SVT with rate in the 200s and developed shortness of breath and hypotension. Given  adenosine 6-->12 mg + 1 liter NS. CCM consulted and started amio drip. In and out of NSR/A fib. Says he has missed a dose or two of eliquis over the last month.   CXR with vascular congestion. Suspicious for PNA. Blood Cx 3/28  NGTD. Procalcitonin < 0.1  HS Trop 7>10   Review of Systems: [y] = yes, [ ]  = no   . General: Weight gain [ ] ; Weight loss [ ] ; Anorexia [ ] ; Fatigue [Y ]; Fever [ ] ; Chills [ ] ; Weakness [ Y]  . Cardiac: Chest pain/pressure [ ] ; Resting SOB [ ] ; Exertional SOB [Y ]; Orthopnea [ ] ; Pedal Edema Gilian.Kraft ]; Palpitations Cove.Etienne ]; Syncope Cove.Etienne ]; Presyncope [ ] ; Paroxysmal nocturnal dyspnea[ ]   . Pulmonary: Cough [ ] ; Wheezing[ ] ; Hemoptysis[ ] ; Sputum [ ] ; Snoring [ ]   . GI: Vomiting[ ] ; Dysphagia[ ] ; Melena[ ] ; Hematochezia [ ] ; Heartburn[ ] ; Abdominal pain [ ] ; Constipation [ ] ; Diarrhea [ ] ; BRBPR [ ]   . GU: Hematuria[ ] ; Dysuria [ ] ; Nocturia[ ]   . Vascular: Pain in legs with walking [ ] ; Pain in  feet with lying flat [ ] ; Non-healing sores [ ] ; Stroke [ ] ; TIA [ ] ; Slurred speech [ ] ;  . Neuro: Headaches[ ] ; Vertigo[ ] ; Seizures[ ] ; Paresthesias[ ] ;Blurred vision [ ] ; Diplopia [ ] ; Vision changes [ ]   . Ortho/Skin: Arthritis ]; Joint pain ]; Muscle pain [ ] ; Joint swelling [ ] ; Back Pain [Y ]; Rash [ ]   . Psych: Depression[ ] ; Anxiety[ ]   . Heme: Bleeding problems [ ] ; Clotting disorders [ ] ; Anemia [ ]   . Endocrine: Diabetes [Y ]; Thyroid dysfunction[ ]   Home Medications Prior to Admission medications   Medication Sig Start Date End Date Taking? Authorizing Provider  acetaminophen (TYLENOL) 500 MG tablet Take 1,000 mg by mouth every 8 (eight) hours as needed (for pain or headaches).   Yes [provider]   ADVAIR DISKUS 100-50 MCG/DOSE AEPB INHALE 1 PUFF INTO THE LUNGS TWICE DAILY Patient taking differently: Inhale 1 puff into the lungs 2 (two) times daily as needed (shortness of breath/wheezing). 07/05/20  Yes Saguier, , PA-C  amiodarone (PACERONE) 200 MG tablet Take 1 tablet (200 mg total) by mouth 2 (two) times daily. 07/22/20  Yes Milford, , FNP  atorvastatin (LIPITOR) 80 MG tablet Take 1 tablet (80 mg total) by mouth daily. 02/17/20 03/19/20 Yes Cove.Etienne, MD  carvedilol (COREG) 12.5 MG tablet Take 1.5 tablets (18.75 mg total) by mouth 2 (two) times daily with a meal. 05/05/20  Yes , MD  clopidogrel (PLAVIX) 75 MG tablet Take 1 tablet (75 mg total) by mouth daily. 06/03/20 06/03/21 Yes , MD  ELIQUIS 5 MG TABS tablet TAKE 1 TABLET(5 MG) BY MOUTH TWICE DAILY Patient taking differently: Take 5 mg by mouth 2 (two) times daily. 05/25/20  Yes , MD  empagliflozin (JARDIANCE) 10 MG TABS tablet Take 1 tablet (10 mg total) by mouth daily before breakfast. 07/22/20  Yes Clegg, Amy D, NP  ENTRESTO 49-51 MG TAKE 1 TABLET BY MOUTH TWICE DAILY Patient taking differently: Take 1 tablet by mouth 2 (two) times daily. 08/09/20  Yes 09/21/20, MD  ezetimibe (ZETIA) 10 MG tablet Take 1 tablet (10 mg total) by mouth daily. 06/17/20  Yes 04/18/20, MD  glipiZIDE (GLUCOTROL) 5 MG tablet Take 1 tablet (5 mg total) by mouth daily before breakfast. 02/24/20  Yes Shamleffer, Laurey Morale, MD  icosapent Ethyl (VASCEPA) 1 g capsule Take 2 capsules (2 g total) by mouth 2 (two) times daily. 06/04/20  Yes Laurey Morale, MD  metFORMIN (GLUCOPHAGE) 1000 MG tablet Take 1 tablet (1,000 mg total) by mouth daily with breakfast. 03/04/20  Yes 06/05/21, MD  nitroGLYCERIN (NITROSTAT) 0.4 MG SL tablet Place 1 tablet (0.4 mg total) under the tongue every 5 (five) minutes as needed for chest pain. 12/09/19  Yes Amin, 07/23/20, MD  polyvinyl alcohol  (ARTIFICIAL TEARS) 1.4 % ophthalmic solution Place 1 drop into both eyes daily as needed for dry eyes (irritation).   Yes [provider]  ranolazine (RANEXA) 500 MG 12 hr tablet Take 1 tablet (500 mg total) by mouth 2 (two) times daily. 07/08/20  Yes 09/21/20, MD  torsemide (DEMADEX) 20 MG tablet Take 2 tablets (40 mg total) by mouth in the morning. 08/05/20  Yes Clegg, Amy D, NP  cholecalciferol (VITAMIN D) 1000 units tablet Take 1 tablet (1,000 Units total) by mouth daily. Patient not taking: Reported on 08/09/2020 12/09/19   Laurey Morale  Chirag, MD    Past Medical History: Past Medical History:  Diagnosis Date  . Allergy    seasonal  . Asthma    mild, intermittent  . Atrial fibrillation with rapid ventricular response (HCC) 03/25/2018  . CAD, multiple vessel    Three-vessel disease involving mid RCA 85% (PCI with DES), OM 2 80% in branch and ostD1 ~90%. Only the mid RCA with PCI target.   . Carotid bruit 08/05/2010   Bruit noted on left   . Chronic combined systolic and diastolic CHF (congestive heart failure) (HCC)   . CKD (chronic kidney disease), stage III (HCC)   . Diabetes mellitus    type 2  . Fracture of multiple ribs 02/12/2015  . Hemothorax on right 01/23/2015  . Hyperlipidemia   . Hypertension   . Hypocalcemia 02/12/2015  . Lactic acidosis 03/25/2018  . Liver function study, abnormal 04/26/2011  . Multiple rib fractures   . Obesity   . Persistent atrial fibrillation [facility](HCC)     Past Surgical History: Past Surgical History:  Procedure Laterality Date  . APPENDECTOMY  1975  . CARDIAC CATHETERIZATION N/A 02/17/2016   Procedure: Left Heart Cath and Coronary Angiography;  Surgeon: Marykay Lexavid W Harding, MD;  [facility];  Service: Cardiovascular: mRCA 85% (PCI), branch of OM2 80%,  ostD1 90%; EF 35-45%  . CARDIAC CATHETERIZATION N/A 02/17/2016   Procedure: Coronary Stent Intervention;  Surgeon: Marykay Lexavid W Harding, MD;  [facility];   Service: Cardiovascular: mRCA PCI : STENT PROMUS PREM David [id number] DES  . CARDIOVERSION N/A 03/29/2018   Procedure: CARDIOVERSION;  Surgeon: Laurey MoraleMcLean, Dalton S, MD;  [facility];  Service: Cardiovascular;  Laterality: N/A;  . CARDIOVERSION N/A 04/02/2018   Procedure: CARDIOVERSION;  Surgeon: Laurey MoraleMcLean, Dalton S, MD;  [facility];  Service: Cardiovascular;  Laterality: N/A;  . CARDIOVERSION N/A 04/19/2018   Procedure: CARDIOVERSION;  Surgeon: Laurey MoraleMcLean, Dalton S, MD;  [facility];  Service: Cardiovascular;  Laterality: N/A;  . CATARACT EXTRACTION  2010   b/l  . CORONARY STENT INTERVENTION N/A 04/03/2018   Procedure: CORONARY STENT INTERVENTION;  Surgeon: Lennette BihariKelly, Thomas A, MD;  Location: [facility] INVAS[facility] CV LAB;  Service: Cardiovascular;  Laterality: N/A;  . CORONARY STENT INTERVENTION N/A 03/02/2020   Procedure: CORONARY STENT INTERVENTION;  Surgeon: Corky CraftsVaranasi, Jayadeep S, MD;  Location: [facility] INVASIVE CV LAB;  Service: Cardiovascular;  Laterality: N/A;  . INTRAVASCULAR ULTRASOUND/IVUS N/A 03/02/2020   Procedure: Intravascular Ultrasound/IVUS;  Surgeon: Corky CraftsVaranasi, Jayadeep S, MD;  [facility];  Service: Cardiovascular;  Laterality: N/A;  . RETINAL LASER PROCEDURE  1985  . RIGHT/LEFT HEART CATH AND CORONARY ANGIOGRAPHY N/A 04/03/2018   Procedure: RIGHT/LEFT HEART CATH AND CORONARY ANGIOGRAPHY;  Surgeon: Laurey MoraleMcLean, Dalton S, MD;  [facility];  Service: Cardiovascular;  Laterality: N/A;  . RIGHT/LEFT HEART CATH AND CORONARY ANGIOGRAPHY N/A 03/02/2020   Procedure: RIGHT/LEFT HEART CATH AND CORONARY ANGIOGRAPHY;  Surgeon: Laurey MoraleMcLean, Dalton S, MD;  [facility];  Service: Cardiovascular;  Laterality: N/A;  . TEE WITHOUT CARDIOVERSION N/A 03/29/2018   Procedure: TRANSESOPHAGEAL ECHOCARDIOGRAM (TEE);  Surgeon: Laurey MoraleMcLean, Dalton S, MD;  [facility];  Service: Cardiovascular;  Laterality: N/A;  . TEE WITHOUT CARDIOVERSION N/A 04/19/2018    Procedure: TRANSESOPHAGEAL ECHOCARDIOGRAM (TEE);  Surgeon: Laurey MoraleMcLean, Dalton S, MD;  [facility];  Service: Cardiovascular;  Laterality: N/A;  . TEE WITHOUT CARDIOVERSION N/A 03/02/2020   Procedure: TRANSESOPHAGEAL ECHOCARDIOGRAM (TEE);  Surgeon: Laurey MoraleMcLean, Dalton S,  MD;  Location: [facility] ENDOS[facility];  Service: Cardiovascular;  Laterality: N/A;  . TONSILLECTOMY AND ADENOIDECTOMY  1947  . TRANSTHORACIC ECHOCARDIOGRAM  02/2016   Mild LVH. Moderately reduced EF of 35-40% with diffuse hypokinesis. Indeterminate diastolic function. Likely elevated LA pressures. Mild aortic stenosis (mean gradient 10 mmHg). Mild David. Mild LA dilation. Mild to moderate PA hypertension with 39 mmHg  . VIDEO ASSISTED THORACOSCOPY (VATS)/THOROCOTOMY Right 01/24/2015   Procedure: VIDEO ASSISTED THORACOSCOPY (VATS) FOR DRAINAGE OF RIGHT HEMOTHORAX;  Surgeon: Alleen Borne, MD;  Location: [facility] OR;  Service: Thoracic;  Laterality: Right;    Family History: Family History  Problem Relation Age of Onset  . CAD Neg Hx     Social History: Social History   Socioeconomic History  . Marital status: Married    Spouse name: Not on file  . Number of children: Not on file  . Years of education: Not on file  . Highest education level: Not on file  Occupational History  . Not on file  Tobacco Use  . Smoking status: Never Smoker  . Smokeless tobacco: Never Used  Vaping Use  . Vaping Use: Never used  Substance and Sexual Activity  . Alcohol use: Yes    Comment: rare use now  . Drug use: No  . Sexual activity: Not on file  Other Topics Concern  . Not on file  Social History Narrative  . Not on file   Social Determinants of Health   Financial Resource Strain: Not on file  Food Insecurity: Not on file  Transportation Needs: Not on file  Physical Activity: Not on file  Stress: Not on file  Social Connections: Not on file    Allergies:  No Known Allergies  Objective:    Vital Signs:   Pulse Rate:  [70-220] 107  (03/29 1145) Resp:  [14-34] 14 (03/29 1145) BP: (72-169)/(51-137) 117/88 (03/29 1145) SpO2:  [88 %-100 %] 93 % (03/29 1145)    Weight change: There were no vitals filed for this visit.  Intake/Output:   Intake/Output Summary (Last 24 hours) at 08/10/2020 1149 Last data filed at 08/10/2020 0807 Gross per 24 hour  Intake 2763.95 ml  Output --  Net 2763.95 ml      Physical Exam    General:  Well appearing. No resp difficulty HEENT: normal Neck: supple. JVP 10-11. Carotids 2+ bilat; no bruits. No lymphadenopathy or thyromegaly appreciated. Cor: PMI nondisplaced. Irregular rate & rhythm. No rubs, gallops or murmurs. Lungs: clear Abdomen: soft, nontender, nondistended. No hepatosplenomegaly. No bruits or masses. Good bowel sounds. Extremities: no cyanosis, clubbing, rash, R and LLE 1+ edema Neuro: alert & orientedx3, cranial nerves grossly intact. moves all 4 extremities w/o difficulty. Affect pleasant   Telemetry    Afib 100-120  EKG    SVT > 200  Labs   Basic Metabolic Panel: Recent Labs  Lab 08/09/20 1800 08/10/20 0541  NA 137 141  K 4.2 3.8  CL 105 105  CO2 23 25  GLUCOSE 187* 120*  BUN 21 17  CREATININE 1.73* 1.62*  CALCIUM 8.3* 8.3*    Liver Function Tests: No results for input(s): AST, ALT, ALKPHOS, BILITOT, PROT, ALBUMIN in the last 168 hours. No results for input(s): LIPASE, AMYLASE in the last 168 hours. No results for input(s): AMMONIA in the last 168 hours.  CBC: Recent Labs  Lab 08/09/20 1643 08/10/20 0541  WBC 10.7* 13.3*  NEUTROABS 8.6*  --   HGB 11.5* 11.8*  HCT 35.0* 35.4*  MCV  95.6 95.7  PLT 394 458*    Cardiac Enzymes: No results for input(s): CKTOTAL, CKMB, CKMBINDEX, TROPONINI in the last 168 hours.  BNP: BNP (last 3 results) Recent Labs    12/06/19 1551 08/09/20 1643  BNP 792.4* 339.4*    ProBNP (last 3 results) Recent Labs    12/17/19 1327  PROBNP 593.0*     CBG: Recent Labs  Lab 08/09/20 2221  08/10/20 0759  GLUCAP 99 169*    Coagulation Studies: No results for input(s): LABPROT, INR in the last 72 hours.   Imaging   DG Chest Port 1 View  Result Date: 08/09/2020 CLINICAL DATA:  Shortness of breath EXAM: PORTABLE CHEST 1 VIEW COMPARISON:  12/17/2019, CT 02/11/2016 FINDINGS: Cardiomegaly with vascular congestion. Small bilateral pleural effusions. Patchy airspace disease in the right greater than left lung base. No pneumothorax. IMPRESSION: Cardiomegaly with vascular congestion and small bilateral pleural effusions. Patchy airspace disease at the right greater than left lung base, suspicious for pneumonia Electronically Signed   By: Jasmine Pang M.D.   On: 08/09/2020 17:46      Medications:     Current Medications: . amiodarone  200 mg Oral BID  . apixaban  5 mg Oral BID  . carvedilol  18.75 mg Oral BID WC  . cholecalciferol  1,000 Units Oral Daily  . clopidogrel  75 mg Oral Daily  . furosemide  80 mg Intravenous BID  . insulin aspart  0-5 Units Subcutaneous QHS  . insulin aspart  0-9 Units Subcutaneous TID WC  . mometasone-formoterol  2 puff Inhalation BID  . ranolazine  500 mg Oral BID     Infusions: . amiodarone 60 mg/hr (08/10/20 0702)  . amiodarone          Assessment/Plan   1. Tachycardia , A fib RVR  -SVT ? A flutter 3/29 with adenosine 6 mg-->12 mg. Started on amio drip with improvement.  -H/O PAF and DC-CV 2019 x3.  -Remains in A fib  100s. Stop po amio. Continue amio 30 mg per hour.  - Over the last 30 days he has missed some doses of eliquis.  - Continue Ranexa 500 mg bid.  - Will need TEE/DC-CV if he does not covert.  - Continue elquis 5 mg twice a day  - Will need EP   2. Hypotension Shock--+/- septic / cardiogenic  -In the setting of SVT/A fib RVR --> BP improved with rate controlled.  -Blood Cultures - NGTD - Received 1 time dose Procalcitonin <0.10   3.  Syncope On the day of admit. ? Arrhythmia    3. A/C Systolic Heart  Failure  - NICM previously suspected to be tachy/mediated.  - Repeat ECHO  - BNP 339 .  -Received IV fluids while hypotensive. Volume overloaded today. . Volume status improved with IV lasix.   - Cut back carvedilol to 6.25 mg twice a day.  - Restart entresto 2426 mg twice a day.  4. CKD Stage IIIb Creatinine on admit 1.7 , today 1.6  Follow daily.  5. DMII On SSI   6. CAD -History of DES to RCA in 9/17. UnderwentLHC11/20/19 with PCI to prox left circ.LHC in 10/21 with PCI to LAD.  Continue plavix continue until 02/2021+ eliquis.  - Anticipate restarting statin tomorrow.  - HS 7>10  - No chest pain  7. Hypertension --Now hypertensive on follow up.  -See above.   8. CKD Stage III Creatinine baseline 1.3-1.5    Length of Stay: 1  Amy  Clegg, NP  08/10/2020, 11:49 AM  Advanced Heart Failure Team Pager [phone] (M-F; 7a - 5p)  Please contact [facility] for night-coverage after hours (4p -7a ) and weekends on [email or web address]  Patient seen and examined with the above-signed Advanced Practice Provider and/or Housestaff. I personally reviewed laboratory data, imaging studies and relevant notes. I independently examined the patient and formulated the important aspects of the plan. I have edited the note to reflect any of my changes or salient points. I have personally discussed the plan with the patient and/or family.  80 y/o male with DM2, 1vCAD, HF with recovered EF, PAF/FL.   Seen in HF Clinic earlier this month back in AFL in 120s and volume overloaded. Amiodarone and torsemide increased.   Admitted overnight with syncope. Was in AFL with RVR and felt to be volume depleted. Subsequently developed apparent atrial tach (vs AFL with faster conduction) at 230 with marked ischemic changes on ECG. Developed severe hypotension. Failed adenosine. Start on IV amio with return to AFL in the 120-140 range  Says he feels ok now. Denies CP or SOB. No edema  General:  Elderly male  sitting up on side of bed  No resp difficulty HEENT: normal Neck: supple. no JVD. Carotids 2+ bilat; no bruits. No lymphadenopathy or thryomegaly appreciated. Cor: PMI nondisplaced. Irregular tachy  Soft SEM RUSB  Lungs: clear Abdomen: soft, nontender, nondistended. No hepatosplenomegaly. No bruits or masses. Good bowel sounds. Extremities: no cyanosis, clubbing, rash, edema Neuro: alert & orientedx3, cranial nerves grossly intact. moves all 4 extremities w/o difficulty. Affect pleasant  He seems to have multiple atrial arrhythmias including a very fast rhythm earlier today that occurred despite chronic amio therapy. Will continue to load amio. Continue Eliquis for now. Will ask EP to weigh in (has seen Dr. Johney Frame in 2019) to discuss options of ablation vs DC-CV vs AVN ablation/pacing. Has only missed one Eliquis dose all month.   Volume status stable. BP now back up. Restart HF meds. Repeat echo.  D/w Dr. Shirlee Latch.   Arvilla Meres, MD  5:32 PM

## 2020-08-10 NOTE — ED Notes (Signed)
MD Nicole Cella and MD Opyd at the bedside at this time with pt.

## 2020-08-11 ENCOUNTER — Encounter (HOSPITAL_COMMUNITY)
Admission: EM | Disposition: A | Discharge status: Home Health | Payer: Self-pay | Source: Home / Self Care | Attending: Internal Medicine

## 2020-08-11 ENCOUNTER — Encounter (HOSPITAL_COMMUNITY): Payer: Self-pay | Admitting: Internal Medicine

## 2020-08-11 ENCOUNTER — Inpatient Hospital Stay (HOSPITAL_COMMUNITY): Payer: Medicare Other

## 2020-08-11 ENCOUNTER — Inpatient Hospital Stay (HOSPITAL_COMMUNITY): Payer: Medicare Other | Admitting: Anesthesiology

## 2020-08-11 DIAGNOSIS — I5021 Acute systolic (congestive) heart failure: Secondary | ICD-10-CM | POA: Diagnosis not present

## 2020-08-11 DIAGNOSIS — I480 Paroxysmal atrial fibrillation: Secondary | ICD-10-CM | POA: Diagnosis not present

## 2020-08-11 DIAGNOSIS — I4891 Unspecified atrial fibrillation: Secondary | ICD-10-CM | POA: Diagnosis not present

## 2020-08-11 DIAGNOSIS — J9601 Acute respiratory failure with hypoxia: Secondary | ICD-10-CM | POA: Diagnosis not present

## 2020-08-11 DIAGNOSIS — R55 Syncope and collapse: Secondary | ICD-10-CM | POA: Diagnosis not present

## 2020-08-11 DIAGNOSIS — I4892 Unspecified atrial flutter: Secondary | ICD-10-CM | POA: Diagnosis not present

## 2020-08-11 DIAGNOSIS — I5043 Acute on chronic combined systolic (congestive) and diastolic (congestive) heart failure: Secondary | ICD-10-CM | POA: Diagnosis not present

## 2020-08-11 HISTORY — PX: CARDIOVERSION: SHX1299

## 2020-08-11 LAB — COMPREHENSIVE METABOLIC PANEL
ALT: 25 U/L (ref 0–44)
AST: 28 U/L (ref 15–41)
Albumin: 2.6 g/dL — ABNORMAL LOW (ref 3.5–5.0)
Alkaline Phosphatase: 65 U/L (ref 38–126)
Anion gap: 10 (ref 5–15)
BUN: 19 mg/dL (ref 8–23)
CO2: 25 mmol/L (ref 22–32)
Calcium: 8.2 mg/dL — ABNORMAL LOW (ref 8.9–10.3)
Chloride: 105 mmol/L (ref 98–111)
Creatinine, Ser: 1.63 mg/dL — ABNORMAL HIGH (ref 0.61–1.24)
GFR, Estimated: 43 mL/min — ABNORMAL LOW (ref >60)
Glucose, Bld: 132 mg/dL — ABNORMAL HIGH (ref 70–99)
Potassium: 3.4 mmol/L — ABNORMAL LOW (ref 3.5–5.1)
Sodium: 140 mmol/L (ref 135–145)
Total Bilirubin: 0.8 mg/dL (ref 0.3–1.2)
Total Protein: 6.5 g/dL (ref 6.5–8.1)

## 2020-08-11 LAB — ECHOCARDIOGRAM COMPLETE
AR max vel: 2.45 cm2
AV Area VTI: 2.33 cm2
AV Area mean vel: 2.7 cm2
AV Mean grad: 13.6 mmHg
AV Peak grad: 27.2 mmHg
Ao pk vel: 2.61 m/s
Area-P 1/2: 5.77 cm2
Height: 72 in
S' Lateral: 3.8 cm
Weight: 4320 oz

## 2020-08-11 LAB — GLUCOSE, CAPILLARY
Glucose-Capillary: 132 mg/dL — ABNORMAL HIGH (ref 70–99)
Glucose-Capillary: 163 mg/dL — ABNORMAL HIGH (ref 70–99)
Glucose-Capillary: 161 mg/dL — ABNORMAL HIGH (ref 70–99)
Glucose-Capillary: 133 mg/dL — ABNORMAL HIGH (ref 70–99)

## 2020-08-11 LAB — CBC WITH DIFFERENTIAL/PLATELET
Abs Immature Granulocytes: 0.1 10*3/uL — ABNORMAL HIGH (ref 0.00–0.07)
Basophils Absolute: 0.1 10*3/uL (ref 0.0–0.1)
Basophils Relative: 1 %
Eosinophils Absolute: 0.2 10*3/uL (ref 0.0–0.5)
Eosinophils Relative: 1 %
HCT: 35.2 % — ABNORMAL LOW (ref 39.0–52.0)
Hemoglobin: 11.6 g/dL — ABNORMAL LOW (ref 13.0–17.0)
Immature Granulocytes: 1 %
Lymphocytes Relative: 11 %
Lymphs Abs: 1.3 10*3/uL (ref 0.7–4.0)
MCH: 31.4 pg (ref 26.0–34.0)
MCHC: 33 g/dL (ref 30.0–36.0)
MCV: 95.4 fL (ref 80.0–100.0)
Monocytes Absolute: 1.5 10*3/uL — ABNORMAL HIGH (ref 0.1–1.0)
Monocytes Relative: 12 %
Neutro Abs: 9.4 10*3/uL — ABNORMAL HIGH (ref 1.7–7.7)
Neutrophils Relative %: 74 %
Platelets: 477 10*3/uL — ABNORMAL HIGH (ref 150–400)
RBC: 3.69 MIL/uL — ABNORMAL LOW (ref 4.22–5.81)
RDW: 12.9 % (ref 11.5–15.5)
WBC: 12.6 10*3/uL — ABNORMAL HIGH (ref 4.0–10.5)
nRBC: 0 % (ref 0.0–0.2)

## 2020-08-11 LAB — MAGNESIUM: Magnesium: 2.5 mg/dL — ABNORMAL HIGH (ref 1.7–2.4)

## 2020-08-11 LAB — MRSA PCR SCREENING: MRSA by PCR: NEGATIVE

## 2020-08-11 SURGERY — CARDIOVERSION
Anesthesia: General

## 2020-08-11 MED ORDER — PERFLUTREN LIPID MICROSPHERE
1.0000 mL | INTRAVENOUS | Status: AC | PRN
  Administered 2020-08-11: 3 mL via INTRAVENOUS
  Filled 2020-08-11: qty 10

## 2020-08-11 MED ORDER — ICOSAPENT ETHYL 1 G PO CAPS
2.0000 g | ORAL_CAPSULE | Freq: Two times a day (BID) | ORAL | Status: DC
  Administered 2020-08-11 – 2020-08-20 (×19): 2 g via ORAL
  Filled 2020-08-11 (×20): qty 2

## 2020-08-11 MED ORDER — PROPOFOL 10 MG/ML IV BOLUS
INTRAVENOUS | Status: DC | PRN
  Administered 2020-08-11: 100 mg via INTRAVENOUS

## 2020-08-11 MED ORDER — EMPAGLIFLOZIN 10 MG PO TABS
10.0000 mg | ORAL_TABLET | Freq: Every day | ORAL | Status: DC
  Administered 2020-08-11 – 2020-08-20 (×10): 10 mg via ORAL
  Filled 2020-08-11 (×10): qty 1

## 2020-08-11 MED ORDER — POTASSIUM CHLORIDE CRYS ER 20 MEQ PO TBCR
30.0000 meq | EXTENDED_RELEASE_TABLET | Freq: Once | ORAL | Status: AC
  Administered 2020-08-11: 30 meq via ORAL

## 2020-08-11 MED ORDER — POTASSIUM CHLORIDE CRYS ER 20 MEQ PO TBCR
40.0000 meq | EXTENDED_RELEASE_TABLET | Freq: Four times a day (QID) | ORAL | Status: AC
  Administered 2020-08-11 (×2): 40 meq via ORAL
  Filled 2020-08-11 (×2): qty 2

## 2020-08-11 NOTE — Progress Notes (Signed)
Patient ID: David Huynh, male   DOB: January 05, 1941, 80 y.o.   MRN: [id number]     Advanced Heart Failure Rounding Note  PCP-Cardiologist: [name], MD   Subjective:    HR in 100s this morning in atrial flutter.  BP stable.  He diuresed overnight with IV Lasix.  No complaints this morning but requiring supplemental oxygen.    Objective:   Weight Range: 130.7 kg Body mass index is 39.08 kg/m.   Vital Signs:   Temp:  [98.1 F (36.7 C)-99.3 F (37.4 C)] 98.3 F (36.8 C) (03/30 0724) Pulse Rate:  [98-122] 117 (03/30 0724) Resp:  [10-28] 25 (03/30 0724) BP: (101-172)/(73-137) 126/79 (03/30 0724) SpO2:  [85 %-96 %] 91 % (03/30 0724) Weight:  [130.7 kg] 130.7 kg (03/30 0300) Last BM Date: 08/09/20  Weight change: Filed Weights   08/11/20 0300  Weight: 130.7 kg    Intake/Output:   Intake/Output Summary (Last 24 hours) at 08/11/2020 0757 Last data filed at 08/11/2020 0400 Gross per 24 hour  Intake 1015.94 ml  Output 2950 ml  Net -1934.06 ml      Physical Exam    General:  Well appearing. No resp difficulty HEENT: Normal Neck: Supple. JVP 12+ cm. Carotids 2+ bilat; no bruits. No lymphadenopathy or thyromegaly appreciated. Cor: PMI nondisplaced. Mildly tachy, regular rate & rhythm. No rubs, gallops or murmurs. Lungs: Clear Abdomen: Soft, nontender, nondistended. No hepatosplenomegaly. No bruits or masses. Good bowel sounds. Extremities: No cyanosis, clubbing, rash. 1+ ankle edema.  Neuro: Alert & orientedx3, cranial nerves grossly intact. moves all 4 extremities w/o difficulty. Affect pleasant   Telemetry   Atrial flutter rate 100s (personally reviewed)   Labs    CBC Recent Labs    08/09/20 1643 08/10/20 0541 08/11/20 0139  WBC 10.7* 13.3* 12.6*  NEUTROABS 8.6*  --  9.4*  HGB 11.5* 11.8* 11.6*  HCT 35.0* 35.4* 35.2*  MCV 95.6 95.7 95.4  PLT 394 458* 477*   Basic Metabolic Panel Recent Labs    28/31/51 0541 08/11/20 0139  NA 141 140  K 3.8  3.4*  CL 105 105  CO2 25 25  GLUCOSE 120* 132*  BUN 17 19  CREATININE 1.62* 1.63*  CALCIUM 8.3* 8.2*  MG  --  2.5*   Liver Function Tests Recent Labs    08/10/20 1300 08/11/20 0139  AST 23 28  ALT 21 25  ALKPHOS 64 65  BILITOT 0.8 0.8  PROT 6.9 6.5  ALBUMIN 2.7* 2.6*   No results for input(s): LIPASE, AMYLASE in the last 72 hours. Cardiac Enzymes No results for input(s): CKTOTAL, CKMB, CKMBINDEX, TROPONINI in the last 72 hours.  BNP: BNP (last 3 results) Recent Labs    12/06/19 1551 08/09/20 1643  BNP 792.4* 339.4*    ProBNP (last 3 results) Recent Labs    12/17/19 1327  PROBNP 593.0*     D-Dimer No results for input(s): DDIMER in the last 72 hours. Hemoglobin A1C No results for input(s): HGBA1C in the last 72 hours. Fasting Lipid Panel No results for input(s): CHOL, HDL, LDLCALC, TRIG, CHOLHDL, LDLDIRECT in the last 72 hours. Thyroid Function Tests No results for input(s): TSH, T4TOTAL, T3FREE, THYROIDAB in the last 72 hours.  Invalid input(s): FREET3  Other results:   Imaging     No results found.   Medications:     Scheduled Medications: . apixaban  5 mg Oral BID  . atorvastatin  80 mg Oral QHS  . carvedilol  6.25 mg  Oral BID WC  . cholecalciferol  1,000 Units Oral Daily  . clopidogrel  75 mg Oral Daily  . empagliflozin  10 mg Oral Daily  . ezetimibe  10 mg Oral QHS  . furosemide  80 mg Intravenous BID  . icosapent Ethyl  2 g Oral BID  . insulin aspart  0-5 Units Subcutaneous QHS  . insulin aspart  0-9 Units Subcutaneous TID WC  . mouth rinse  15 mL Mouth Rinse BID  . mometasone-formoterol  2 puff Inhalation BID  . potassium chloride  40 mEq Oral Q6H  . ranolazine  500 mg Oral BID  . sacubitril-valsartan  1 tablet Oral BID     Infusions: . amiodarone 30 mg/hr (08/11/20 5400)     PRN Medications:      Assessment/Plan   1. Acute on chronic systolic CHF: Prior ischemic cardiomyopathy, improved after PCI in 2017. Echo  03/2018 with EF back down to 35-40% and moderate-severe RV dysfunction. This was in the setting of atrial fibrillation with RVR. EF 40% on TEE 04/2018.  Echo was done in 3/20, showing EF up to 50%.  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  Echo in 7/21 with EF 25-30%, severe RV dysfunction.  TEE in 10/21, however, showed EF up to 50%.  He had PCI to LAD in 10/21.  He is now volume overloaded in setting of recurrent atrial fibrillation/flutter.   - Continue Lasix 80 mg IV bid, good diuresis yesterday.  Replace K.  Sherryll Burger 24/26 bid, can increase to home dose 49/51 bid if BP remains stable.  -Continue Coreg at lower dose for now 6.25 mg bid.  - Restart empagliflozin 10 mg daily.  - Repeat echo.  2. Atrial fibrillation/Atrial flutter: As above, concerned for possible tachy-mediated CMP in past. Had successful DC-CV on 03/29/18 but then back in atrial fibrillation.Ranolazine added, but failed DCCV again 04/02/18. DCCV 04/19/18 was successful and he remained in NSR on amiodarone + ranolazine. He opted against atrial fibrillation ablation in the past (saw Dr. Johney Frame).  He was noted to be in atrial fibrillation at last clinic visit and plan was made for DCCV if he did not convert, but he was admitted with syncope and rapid SVT in 200s yesterday.  SVT was regular, ?1:1 atrial flutter.  With IV amiodarone, he is now in atrial flutter in 100s.  - Continue ranolazine 500 mg bid.  - Continue IV amiodarone.  - Continue apixaban.   - Will plan DCCV today (discussed risks/benefits with patient).  He missed 1 dose Eliquis about a month ago, think we can avoid TEE. - Discussed with EP (Dr. Elberta Fortis).  He recommends DCCV now, then close followup with Dr. Johney Frame as patient is going to need atrial fibrillation + atrial flutter ablation.  Prefers to keep him on amiodarone 200 mg bid + ranolazine then set up procedure as outpatient.  3. CKD: Stage 3.  Creatinine stable at 1.6, follow.   4. CAD: History of DES to RCA in 9/17. UnderwentLHC11/20/19 with PCI to prox left circ.LHC in 10/21 with PCI to LAD.   -He is on Eliquis so no ASA.   - Continue Plavix ideally x 1 yr (to 10/22).  - Continue atorvastatin and vascepa.  5. DM2: Per primary.  6. Aortic stenosis: Moderate on TEE in 10/21 and easily crossed at cath.  7. Mitral regurgitation: Moderate by TEE in 10/21. Repeating echo this admission.   Length of Stay: 2  [name],  MD  08/11/2020, 7:57 AM  Advanced Heart Failure Team Pager [phone] (M-F; 7a - 5p)  Please contact [facility] for night-coverage after hours (5p -7a ) and weekends on [email or web address]

## 2020-08-11 NOTE — Anesthesia Postprocedure Evaluation (Signed)
Anesthesia Post Note  Patient: RAYAN INES  Procedure(s) Performed: CARDIOVERSION (N/A )     Patient location during evaluation: PACU Anesthesia Type: General Level of consciousness: awake and alert Pain management: pain level controlled Vital Signs Assessment: post-procedure vital signs reviewed and stable Respiratory status: spontaneous breathing, nonlabored ventilation, respiratory function stable and patient connected to nasal cannula oxygen Cardiovascular status: blood pressure returned to baseline and stable Postop Assessment: no apparent nausea or vomiting Anesthetic complications: no   No complications documented.  Last Vitals:  Vitals:   08/11/20 0935 08/11/20 0940  BP: 129/67 131/70  Pulse: 87 87  Resp: (!) 26 19  Temp:    SpO2: 90% 90%    Last Pain:  Vitals:   08/11/20 0940  TempSrc:   PainSc: 0-No pain                 Swati Granberry,W. EDMOND

## 2020-08-11 NOTE — Plan of Care (Signed)

## 2020-08-11 NOTE — Progress Notes (Signed)
  Echocardiogram 2D Echocardiogram with definity has been performed.  Leta Jungling M 08/11/2020, 11:52 AM

## 2020-08-11 NOTE — Transfer of Care (Signed)
Immediate Anesthesia Transfer of Care Note  Patient: David Huynh  Procedure(s) Performed: CARDIOVERSION (N/A )  Patient Location: Endoscopy Unit  Anesthesia Type:General  Level of Consciousness: awake, alert  and oriented  Airway & Oxygen Therapy: Patient connected to face mask oxygen  Post-op Assessment: Post -op Vital signs reviewed and stable  Post vital signs: stable  Last Vitals:  Vitals Value Taken Time  BP    Temp    Pulse    Resp    SpO2      Last Pain:  Vitals:   08/11/20 0824  TempSrc: Axillary  PainSc: 0-No pain         Complications: No complications documented.

## 2020-08-11 NOTE — Procedures (Signed)
Electrical Cardioversion Procedure Note DIANE MOCHIZUKI [id number] [birth year]  Procedure: Electrical Cardioversion Indications:  Atrial Fibrillation and Atrial Flutter  Procedure Details Consent: Risks of procedure as well as the alternatives and risks of each were explained to the (patient/caregiver).  Consent for procedure obtained. Time Out: Verified patient identification, verified procedure, site/side was marked, verified correct patient position, special equipment/implants available, medications/allergies/relevent history reviewed, required imaging and test results available.  Performed  Patient placed on cardiac monitor, pulse oximetry, supplemental oxygen as necessary.  Sedation given: Propofol per anesthesiology Pacer pads placed anterior and posterior chest.  Cardioverted 2 time(s).  Cardioverted at 200J.  Evaluation Findings: Post procedure EKG shows: NSR Complications: None Patient did tolerate procedure well.   [name] 08/11/2020, 9:12 AM

## 2020-08-11 NOTE — Anesthesia Preprocedure Evaluation (Signed)
Anesthesia Evaluation  Patient identified by MRN, date of birth, ID band Patient awake    Reviewed: Allergy & Precautions, H&P , NPO status , Patient's Chart, lab work & pertinent test results, reviewed documented beta blocker date and time   Airway Mallampati: II  TM Distance: >3 FB Neck ROM: Full    Dental no notable dental hx. (+) Teeth Intact, Dental Advisory Given   Pulmonary shortness of breath and at rest, asthma ,    Pulmonary exam normal breath sounds clear to auscultation       Cardiovascular hypertension, Pt. on medications and Pt. on home beta blockers + CAD, + Cardiac Stents and +CHF  + dysrhythmias Atrial Fibrillation  Rhythm:Regular Rate:Tachycardia     Neuro/Psych negative neurological ROS  negative psych ROS   GI/Hepatic negative GI ROS, Neg liver ROS,   Endo/Other  diabetes, Type 2, Oral Hypoglycemic AgentsMorbid obesity  Renal/GU negative Renal ROS  negative genitourinary   Musculoskeletal   Abdominal   Peds  Hematology negative hematology ROS (+)   Anesthesia Other Findings   Reproductive/Obstetrics negative OB ROS                             Anesthesia Physical Anesthesia Plan  ASA: III  Anesthesia Plan: General   Post-op Pain Management:    Induction: Intravenous  PONV Risk Score and Plan: 2 and Propofol infusion and Treatment may vary due to age or medical condition  Airway Management Planned: Mask  Additional Equipment:   Intra-op Plan:   Post-operative Plan:   Informed Consent: I have reviewed the patients History and Physical, chart, labs and discussed the procedure including the risks, benefits and alternatives for the proposed anesthesia with the patient or authorized representative who has indicated his/her understanding and acceptance.     Dental advisory given  Plan Discussed with: CRNA  Anesthesia Plan Comments:         Anesthesia  Quick Evaluation

## 2020-08-11 NOTE — Progress Notes (Signed)
PROGRESS NOTE    David Huynh  JFH:[id number] DOB: Oct 08, 1940 DOA: 08/09/2020 PCP: Bradd Canary, MD   Brief Narrative:  HPI on 08/09/2020 by Dr. Benita Gutter David Huynh is a 80 y.o. male with medical history significant for CAD, chronic systolic CHF,PAF,HTN, HLD, DM, moderate AS, CKD 3, mild persistent asthma who presents with concerns of increasing shortness of breath and syncope. About 2 days ago he began to note worsening shortness of breath with exertion and then progressively became short of breath even with just standing.  Today he got up out of bed and was getting ready to follow up with cardiology when he felt dizzy with heart palpitations and fell back onto his bed with loss of consciousness.  Wife called EMS afterwards.  Noted to be in atrial fibrillation/atrial flutter with rates of 100-120 by EMS.  CBG of 340.  Also noted to have some orthostatic changes from laying to sitting going from systolic of 140-1 08.  He has been having some increasing lower extremity and abdominal edema for past several weeks.He was last seen by cardiology on 07/22/20 and has been progressively getting increase in Torsemide since January due to increase in wight. Also had increase in his amiodarone since he was noted to be in atrial flutter during that visit. He is followed by endocrinology for his diabetes and previously had his Metformin reduced due to worsening GFR of less than 45.  Interim history Patient presented with worsening shortness of breath and syncope.  Found to have atrial fibrillation/flutter along with orthostatic changes.  Cardiology was consulted and appreciated, currently being treated for CHF exacerbation along with A. fib/a flutter.  Status post cardioversion today. Assessment & Plan   Paroxysmal atrial fibrillation/flutter with RVR -Patient noted to have heart rates of up to 220s with no response to vagal maneuvers.  He was treated with adenosine x2 and was noted to have  a drop in blood pressure. -CCM was consulted and patient was placed on IV amiodarone bolus and continued drip. -Cardiology consulted and appreciated -Status post cardioversion today -Patient will need follow-up with electrophysiology -Continue Eliquis  Acute combined systolic and diastolic heart failure/acute respiratory failure with hypoxia -Patient was noted to have increased shortness of breath and was hypoxic in the 80s and required supplemental oxygen.  Does not use oxygen at home. -Prior to admission, torsemide was increased to 60 mg twice daily by cardiology. -Echocardiogram shows an EF of 50 to 55%, LV demonstrates regional wall motion abnormalities.  LV diastolic parameters indeterminate. -Chest x-ray on admission showed bilateral pleural effusions -Continue monitor intake and output, daily weights -Placed on IV Lasix 80 mg twice daily -Continue supplemental oxygen, and wean as possible -Patient may need outpatient sleep study -Continue Entresto, Coreg, Jardiance  Syncope with loss of consciousness -Likely secondary to the above -Echocardiogram is unable to visualize mitral valve.  Moderate tricuspid valve regurgitation.  Mild aortic valve stenosis.  Aortic dilatation measuring 40 mm.  Leukocytosis/thrombocytosis -Suspect reactive, no signs or source of infection at this time  Chronic kidney disease, stage IIIb -Creatinine appears to be stable, continue to monitor BMP  Anemia of chronic disease -Hemoglobin appears to be stable, continue monitor CBC  History of CAD -With drug-eluting stent -Currently no chest -Continue Coreg, Plavix, Eliquis, Ranexa  Diabetes mellitus, type II -Hemoglobin A1c was 6.5 approximately 2 months ago -Continue insulin sliding scale CBG monitoring   History of mild persistent asthma -Continue bronchodilators as needed and incentive spirometer -Currently no wheezing  on exam  DVT Prophylaxis Eliquis  Code Status: Full  Family  Communication: None at bedside  Disposition Plan:  Status is: Inpatient  Remains inpatient appropriate because:IV treatments appropriate due to intensity of illness or inability to take PO   Dispo: The patient is from: Home              Anticipated d/c is to: Home              Patient currently is not medically stable to d/c.   Difficult to place patient No   Consultants PCCM Cardiology  Procedures  Echocardiogram Cardioversion  Antibiotics   Anti-infectives (From admission, onward)   Start     Dose/Rate Route Frequency Ordered Stop   08/09/20 2015  cefTRIAXone (ROCEPHIN) 1 g in sodium chloride 0.9 % 100 mL IVPB        1 g 200 mL/hr over 30 Minutes Intravenous  Once 08/09/20 2004 08/09/20 2112   08/09/20 2015  azithromycin (ZITHROMAX) 500 mg in sodium chloride 0.9 % 250 mL IVPB        500 mg 250 mL/hr over 60 Minutes Intravenous  Once 08/09/20 2004 08/09/20 2135      Subjective:   David Huynh seen and examined today.   Denies current chest pain, shortness of breath but states he still on oxygen.  Denies current abdominal pain, nausea vomiting, diarrhea or constipation, dizziness or headache.  Would like to go home soon.  Objective:   Vitals:   08/11/20 0930 08/11/20 0935 08/11/20 0940 08/11/20 1030  BP: (!) 124/59 129/67 131/70 (!) 157/96  Pulse: 88 87 87   Resp: (!) 22 (!) 26 19 (!) 23  Temp:    98.7 F (37.1 C)  TempSrc:    Oral  SpO2: 90% 90% 90%   Weight:      Height:        Intake/Output Summary (Last 24 hours) at 08/11/2020 1316 Last data filed at 08/11/2020 0400 Gross per 24 hour  Intake 765.94 ml  Output 2950 ml  Net -2184.06 ml   Filed Weights   08/11/20 0300 08/11/20 0824  Weight: 130.7 kg 122.5 kg    Exam  General: Well developed, well nourished, NAD, appears stated age  HEENT: NCAT, mucous membranes moist.   Cardiovascular: S1 S2 auscultated, tachycardic   Respiratory: Diminished breath sounds however clear  Abdomen: Soft, obese,  nontender, nondistended, + bowel sounds  Extremities: warm dry without cyanosis clubbing.  LE edema bilaterally  Neuro: AAOx3, nonfocal  Psych: appropriate mood and affect, pleasant   Data Reviewed: I have personally reviewed following labs and imaging studies  CBC: Recent Labs  Lab 08/09/20 1643 08/10/20 0541 08/11/20 0139  WBC 10.7* 13.3* 12.6*  NEUTROABS 8.6*  --  9.4*  HGB 11.5* 11.8* 11.6*  HCT 35.0* 35.4* 35.2*  MCV 95.6 95.7 95.4  PLT 394 458* 477*   Basic Metabolic Panel: Recent Labs  Lab 08/09/20 1800 08/10/20 0541 08/11/20 0139  NA 137 141 140  K 4.2 3.8 3.4*  CL 105 105 105  CO2 23 25 25   GLUCOSE 187* 120* 132*  BUN 21 17 19   CREATININE 1.73* 1.62* 1.63*  CALCIUM 8.3* 8.3* 8.2*  MG  --   --  2.5*   GFR: Estimated Creatinine Clearance: 49.7 mL/min (A) (by C-G formula based on SCr of 1.63 mg/dL (H)). Liver Function Tests: Recent Labs  Lab 08/10/20 1300 08/11/20 0139  AST 23 28  ALT 21 25  ALKPHOS 64 65  BILITOT 0.8 0.8  PROT 6.9 6.5  ALBUMIN 2.7* 2.6*   No results for input(s): LIPASE, AMYLASE in the last 168 hours. No results for input(s): AMMONIA in the last 168 hours. Coagulation Profile: No results for input(s): INR, PROTIME in the last 168 hours. Cardiac Enzymes: No results for input(s): CKTOTAL, CKMB, CKMBINDEX, TROPONINI in the last 168 hours. BNP (last 3 results) Recent Labs    12/17/19 1327  PROBNP 593.0*   HbA1C: No results for input(s): HGBA1C in the last 72 hours. CBG: Recent Labs  Lab 08/10/20 1213 08/10/20 1814 08/10/20 2120 08/11/20 0539 08/11/20 1147  GLUCAP 174* 153* 148* 132* 163*   Lipid Profile: No results for input(s): CHOL, HDL, LDLCALC, TRIG, CHOLHDL, LDLDIRECT in the last 72 hours. Thyroid Function Tests: No results for input(s): TSH, T4TOTAL, FREET4, T3FREE, THYROIDAB in the last 72 hours. Anemia Panel: No results for input(s): VITAMINB12, FOLATE, FERRITIN, TIBC, IRON, RETICCTPCT in the last 72  hours. Urine analysis:    Component Value Date/Time   COLORURINE STRAW (A) 03/25/2018 1627   APPEARANCEUR CLEAR 03/25/2018 1627   LABSPEC >1.030 (H) 03/25/2018 1627   PHURINE 5.5 03/25/2018 1627   GLUCOSEU 100 (A) 03/25/2018 1627   HGBUR TRACE (A) 03/25/2018 1627   BILIRUBINUR NEGATIVE 03/25/2018 1627   KETONESUR NEGATIVE 03/25/2018 1627   PROTEINUR 100 (A) 03/25/2018 1627   NITRITE NEGATIVE 03/25/2018 1627   LEUKOCYTESUR NEGATIVE 03/25/2018 1627   Sepsis Labs: @LABRCNTIP (procalcitonin:4,lacticidven:4)  ) Recent Results (from the past 240 hour(s))  Resp Panel by RT-PCR (Flu A&B, Covid) Nasopharyngeal Swab     Status: None   Collection Time: 08/09/20  5:09 PM   Specimen: Nasopharyngeal Swab; Nasopharyngeal(NP) swabs in vial transport medium  Result Value Ref Range Status   SARS Coronavirus 2 by RT PCR NEGATIVE NEGATIVE Final    Comment: (NOTE) SARS-CoV-2 target nucleic acids are NOT DETECTED.  The SARS-CoV-2 RNA is generally detectable in upper respiratory specimens during the acute phase of infection. The lowest concentration of SARS-CoV-2 viral copies this assay can detect is 138 copies/mL. A negative result does not preclude SARS-Cov-2 infection and should not be used as the sole basis for treatment or other patient management decisions. A negative result may occur with  improper specimen collection/handling, submission of specimen other than nasopharyngeal swab, presence of viral mutation(s) within the areas targeted by this assay, and inadequate number of viral copies(<138 copies/mL). A negative result must be combined with clinical observations, patient history, and epidemiological information. The expected result is Negative.  Fact Sheet for Patients:  BloggerCourse.comhttps://www.fda.gov/media/152166/download  Fact Sheet for Healthcare Providers:  SeriousBroker.ithttps://www.fda.gov/media/152162/download  This test is no t yet approved or cleared by the [place] FDA and  has been  authorized for detection and/or diagnosis of SARS-CoV-2 by FDA under an Emergency Use Authorization (EUA). This EUA will remain  in effect (meaning this test can be used) for the duration of the COVID-19 declaration under Section 564(b)(1) of the Act, 21 U.S.C.section 360bbb-3(b)(1), unless the authorization is terminated  or revoked sooner.       Influenza A by PCR NEGATIVE NEGATIVE Final   Influenza B by PCR NEGATIVE NEGATIVE Final    Comment: (NOTE) The Xpert Xpress SARS-CoV-2/FLU/RSV plus assay is intended as an aid in the diagnosis of influenza from Nasopharyngeal swab specimens and should not be used as a sole basis for treatment. Nasal washings and aspirates are unacceptable for Xpert Xpress SARS-CoV-2/FLU/RSV testing.  Fact Sheet for Patients: BloggerCourse.comhttps://www.fda.gov/media/152166/download  Fact Sheet for Healthcare Providers:  SeriousBroker.it  This test is not yet approved or cleared by the [place] and has been authorized for detection and/or diagnosis of SARS-CoV-2 by FDA under an Emergency Use Authorization (EUA). This EUA will remain in effect (meaning this test can be used) for the duration of the COVID-19 declaration under Section 564(b)(1) of the Act, 21 U.S.C. section 360bbb-3(b)(1), unless the authorization is terminated or revoked.  Performed at [facility], [address] [place], [place] [address]   Culture, blood (routine x 2)     Status: None (Preliminary result)   Collection Time: 08/09/20  8:15 PM   Specimen: BLOOD  Result Value Ref Range Status   Specimen Description BLOOD LEFT ANTECUBITAL  Final   Special Requests   Final    BOTTLES DRAWN AEROBIC AND ANAEROBIC Blood Culture adequate volume   Culture   Final    NO GROWTH 2 DAYS Performed at [facility], [address] [place], [place] [address]    Report Status PENDING  Incomplete  Culture, blood (routine x 2)     Status: None (Preliminary result)    Collection Time: 08/09/20  8:20 PM   Specimen: BLOOD  Result Value Ref Range Status   Specimen Description BLOOD RIGHT ANTECUBITAL  Final   Special Requests   Final    BOTTLES DRAWN AEROBIC AND ANAEROBIC Blood Culture adequate volume   Culture   Final    NO GROWTH 2 DAYS Performed at [facility], [address] [place], [place] [address]    Report Status PENDING  Incomplete  MRSA PCR Screening     Status: None   Collection Time: 08/11/20  1:08 AM   Specimen: Nasal Mucosa; Nasopharyngeal  Result Value Ref Range Status   MRSA by PCR NEGATIVE NEGATIVE Final    Comment:        The GeneXpert MRSA Assay (FDA approved for NASAL specimens only), is one component of a comprehensive MRSA colonization surveillance program. It is not intended to diagnose MRSA infection nor to guide or monitor treatment for MRSA infections. Performed at [facility], [address], [place], [place] [address]       Radiology Studies: DG Chest Port 1 View  Result Date: 08/09/2020 CLINICAL DATA:  Shortness of breath EXAM: PORTABLE CHEST 1 VIEW COMPARISON:  12/17/2019, CT 02/11/2016 FINDINGS: Cardiomegaly with vascular congestion. Small bilateral pleural effusions. Patchy airspace disease in the right greater than left lung base. No pneumothorax. IMPRESSION: Cardiomegaly with vascular congestion and small bilateral pleural effusions. Patchy airspace disease at the right greater than left lung base, suspicious for pneumonia Electronically Signed   By: Jasmine Pang M.D.   On: 08/09/2020 17:46     Scheduled Meds: . apixaban  5 mg Oral BID  . atorvastatin  80 mg Oral QHS  . carvedilol  6.25 mg Oral BID WC  . cholecalciferol  1,000 Units Oral Daily  . clopidogrel  75 mg Oral Daily  . empagliflozin  10 mg Oral Daily  . ezetimibe  10 mg Oral QHS  . furosemide  80 mg Intravenous BID  . icosapent Ethyl  2 g Oral BID  . insulin aspart  0-5 Units Subcutaneous QHS  . insulin aspart  0-9 Units  Subcutaneous TID WC  . mouth rinse  15 mL Mouth Rinse BID  . mometasone-formoterol  2 puff Inhalation BID  . potassium chloride  30 mEq Oral Once  . potassium chloride  40 mEq Oral Q6H  .  ranolazine  500 mg Oral BID  . sacubitril-valsartan  1 tablet Oral BID   Continuous Infusions: . amiodarone 30 mg/hr (08/11/20 0857)     LOS: 2 days   Time Spent in minutes   45 minutes  Jakyla Reza D.O. on 08/11/2020 at 1:16 PM  Between 7am to 7pm - Please see pager noted on [email or web address]  After 7pm go to [email or web address]  And look for the night coverage person covering for me after hours  [facility] Office  [phone]

## 2020-08-12 ENCOUNTER — Inpatient Hospital Stay (HOSPITAL_COMMUNITY): Payer: Medicare Other

## 2020-08-12 ENCOUNTER — Encounter (HOSPITAL_COMMUNITY): Payer: Self-pay | Admitting: Cardiology

## 2020-08-12 DIAGNOSIS — I480 Paroxysmal atrial fibrillation: Secondary | ICD-10-CM | POA: Diagnosis not present

## 2020-08-12 DIAGNOSIS — R55 Syncope and collapse: Secondary | ICD-10-CM | POA: Diagnosis not present

## 2020-08-12 DIAGNOSIS — I5043 Acute on chronic combined systolic (congestive) and diastolic (congestive) heart failure: Secondary | ICD-10-CM | POA: Diagnosis not present

## 2020-08-12 DIAGNOSIS — J9601 Acute respiratory failure with hypoxia: Secondary | ICD-10-CM | POA: Diagnosis not present

## 2020-08-12 LAB — CBC
HCT: 36.1 % — ABNORMAL LOW (ref 39.0–52.0)
Hemoglobin: 11.7 g/dL — ABNORMAL LOW (ref 13.0–17.0)
MCH: 31.6 pg (ref 26.0–34.0)
MCHC: 32.4 g/dL (ref 30.0–36.0)
MCV: 97.6 fL (ref 80.0–100.0)
Platelets: 496 10*3/uL — ABNORMAL HIGH (ref 150–400)
RBC: 3.7 MIL/uL — ABNORMAL LOW (ref 4.22–5.81)
RDW: 12.9 % (ref 11.5–15.5)
WBC: 13.4 10*3/uL — ABNORMAL HIGH (ref 4.0–10.5)
nRBC: 0 % (ref 0.0–0.2)

## 2020-08-12 LAB — BASIC METABOLIC PANEL
Anion gap: 9 (ref 5–15)
BUN: 21 mg/dL (ref 8–23)
CO2: 25 mmol/L (ref 22–32)
Calcium: 8.3 mg/dL — ABNORMAL LOW (ref 8.9–10.3)
Chloride: 106 mmol/L (ref 98–111)
Creatinine, Ser: 1.68 mg/dL — ABNORMAL HIGH (ref 0.61–1.24)
GFR, Estimated: 41 mL/min — ABNORMAL LOW (ref >60)
Glucose, Bld: 130 mg/dL — ABNORMAL HIGH (ref 70–99)
Potassium: 3.8 mmol/L (ref 3.5–5.1)
Sodium: 140 mmol/L (ref 135–145)

## 2020-08-12 LAB — GLUCOSE, CAPILLARY
Glucose-Capillary: 128 mg/dL — ABNORMAL HIGH (ref 70–99)
Glucose-Capillary: 193 mg/dL — ABNORMAL HIGH (ref 70–99)
Glucose-Capillary: 154 mg/dL — ABNORMAL HIGH (ref 70–99)
Glucose-Capillary: 143 mg/dL — ABNORMAL HIGH (ref 70–99)

## 2020-08-12 LAB — MAGNESIUM: Magnesium: 2.6 mg/dL — ABNORMAL HIGH (ref 1.7–2.4)

## 2020-08-12 MED ORDER — METOLAZONE 2.5 MG PO TABS
2.5000 mg | ORAL_TABLET | Freq: Once | ORAL | Status: AC
  Administered 2020-08-12: 2.5 mg via ORAL
  Filled 2020-08-12: qty 1

## 2020-08-12 MED ORDER — POTASSIUM CHLORIDE CRYS ER 20 MEQ PO TBCR
40.0000 meq | EXTENDED_RELEASE_TABLET | Freq: Once | ORAL | Status: AC
  Administered 2020-08-12: 40 meq via ORAL
  Filled 2020-08-12: qty 2

## 2020-08-12 MED ORDER — ALBUTEROL SULFATE (2.5 MG/3ML) 0.083% IN NEBU
2.5000 mg | INHALATION_SOLUTION | RESPIRATORY_TRACT | Status: DC | PRN
  Administered 2020-08-12 – 2020-08-13 (×5): 2.5 mg via RESPIRATORY_TRACT
  Filled 2020-08-12 (×5): qty 3

## 2020-08-12 MED ORDER — SACUBITRIL-VALSARTAN 49-51 MG PO TABS
1.0000 | ORAL_TABLET | Freq: Two times a day (BID) | ORAL | Status: DC
  Administered 2020-08-12 – 2020-08-20 (×17): 1 via ORAL
  Filled 2020-08-12 (×18): qty 1

## 2020-08-12 NOTE — Progress Notes (Signed)
Patient ID: David Huynh, male   DOB: 1940-08-09, 80 y.o.   MRN: [id number]     Advanced Heart Failure Rounding Note  PCP-Cardiologist: [name], MD   Subjective:    DCCV to NSR on 3/30.  Echo showed EF still in 50-55% range (stable) with normal RV, moderate biatrial enlargement, mild MR, moderate TR, mild AS.   I/Os negative with weight down.  Still on HFNC with marginal oxygen saturation.  Creatinine stable at 1.68.   No dyspnea at rest, was short of breath walking in the hall.    Objective:   Weight Range: 127.5 kg Body mass index is 38.11 kg/m.   Vital Signs:   Temp:  [98.3 F (36.8 C)-99.3 F (37.4 C)] 98.5 F (36.9 C) (03/31 0741) Pulse Rate:  [78-88] 78 (03/31 0741) Resp:  [19-26] 23 (03/31 0741) BP: (119-157)/(59-96) 125/62 (03/31 0741) SpO2:  [90 %-95 %] 92 % (03/31 0810) FiO2 (%):  [100 %] 100 % (03/30 0920) Weight:  [127.5 kg] 127.5 kg (03/31 0500) Last BM Date: 08/09/20  Weight change: Filed Weights   08/11/20 0300 08/11/20 0824 08/12/20 0500  Weight: 130.7 kg 122.5 kg 127.5 kg    Intake/Output:   Intake/Output Summary (Last 24 hours) at 08/12/2020 0904 Last data filed at 08/12/2020 0800 Gross per 24 hour  Intake 1385.19 ml  Output 3550 ml  Net -2164.81 ml      Physical Exam    General: NAD Neck:JVP 12 range, no thyromegaly or thyroid nodule.  Lungs: Crackles at bases.  CV: Nondisplaced PMI.  Heart regular S1/S2, no S3/S4, no murmur.  1+ ankle edema.  Abdomen: Soft, nontender, no hepatosplenomegaly, no distention.  Skin: Intact without lesions or rashes.  Neurologic: Alert and oriented x 3.  Psych: Normal affect. Extremities: No clubbing or cyanosis.  HEENT: Normal.    Telemetry   NSR 80s (personally reviewed)   Labs    CBC Recent Labs    08/09/20 1643 08/10/20 0541 08/11/20 0139 08/12/20 0102  WBC 10.7*   < > 12.6* 13.4*  NEUTROABS 8.6*  --  9.4*  --   HGB 11.5*   < > 11.6* 11.7*  HCT 35.0*   < > 35.2* 36.1*  MCV  95.6   < > 95.4 97.6  PLT 394   < > 477* 496*   < > = values in this interval not displayed.   Basic Metabolic Panel Recent Labs    33/38/32 0139 08/12/20 0102  NA 140 140  K 3.4* 3.8  CL 105 106  CO2 25 25  GLUCOSE 132* 130*  BUN 19 21  CREATININE 1.63* 1.68*  CALCIUM 8.2* 8.3*  MG 2.5* 2.6*   Liver Function Tests Recent Labs    08/10/20 1300 08/11/20 0139  AST 23 28  ALT 21 25  ALKPHOS 64 65  BILITOT 0.8 0.8  PROT 6.9 6.5  ALBUMIN 2.7* 2.6*   No results for input(s): LIPASE, AMYLASE in the last 72 hours. Cardiac Enzymes No results for input(s): CKTOTAL, CKMB, CKMBINDEX, TROPONINI in the last 72 hours.  BNP: BNP (last 3 results) Recent Labs    12/06/19 1551 08/09/20 1643  BNP 792.4* 339.4*    ProBNP (last 3 results) Recent Labs    12/17/19 1327  PROBNP 593.0*     D-Dimer No results for input(s): DDIMER in the last 72 hours. Hemoglobin A1C No results for input(s): HGBA1C in the last 72 hours. Fasting Lipid Panel No results for input(s): CHOL, HDL, LDLCALC, TRIG,  CHOLHDL, LDLDIRECT in the last 72 hours. Thyroid Function Tests No results for input(s): TSH, T4TOTAL, T3FREE, THYROIDAB in the last 72 hours.  Invalid input(s): FREET3  Other results:   Imaging    ECHOCARDIOGRAM COMPLETE  Result Date: 08/11/2020    ECHOCARDIOGRAM REPORT   Patient Name:   AMEDEE Huynh Date of Exam: 08/11/2020 Medical Rec #:  [id number]         Height:       72.0 in Accession #:    [id number]        Weight:       270.0 lb Date of Birth:  Feb 20, 1941         BSA:          2.420 m Patient Age:    80 years          BP:           157/96 mmHg Patient Gender: M                 HR:           92 bpm. Exam Location:  Inpatient Procedure: 2D Echo, Cardiac Doppler, Color Doppler and Intracardiac            Opacification Agent Indications:    CHF-Acute Systolic I50.21  History:        Patient has prior history of Echocardiogram examinations, most                 recent 03/30/2020.  CHF, CAD, Arrythmias:Atrial Fibrillation;                 Risk Factors:Dyslipidemia, Diabetes and Hypertension. Ischemic                 Cardiomyopathy.  Sonographer:    Leta Jungling RDCS Referring Phys: [id number] CHING T TU  Sonographer Comments: Suboptimal apical window. Image acquisition challenging due to respiratory motion. IMPRESSIONS  1. Inferior basal hypokinesis . Left ventricular ejection fraction, by estimation, is 50 to 55%. The left ventricle has low normal function. The left ventricle demonstrates regional wall motion abnormalities (see scoring diagram/findings for description). Left ventricular diastolic parameters are indeterminate.  2. Right ventricular systolic function is normal. The right ventricular size is normal. There is normal pulmonary artery systolic pressure.  3. Left atrial size was moderately dilated.  4. Right atrial size was moderately dilated.  5. Color flow interrogation of MV not very good due to poor image quality MR previously described as moderate on TEE 02/2020 . The mitral valve was not well visualized. Mild mitral valve regurgitation. No evidence of mitral stenosis. Moderate mitral annular calcification.  6. Tricuspid valve regurgitation is moderate.  7. The aortic valve is tricuspid. There is moderate calcification of the aortic valve. There is moderate thickening of the aortic valve. Aortic valve regurgitation is not visualized. Mild aortic valve stenosis.  8. Aortic dilatation noted. There is mild dilatation of the aortic root, measuring 40 mm.  9. The inferior vena cava is normal in size with greater than 50% respiratory variability, suggesting right atrial pressure of 3 mmHg. FINDINGS  Left Ventricle: Inferior basal hypokinesis. Left ventricular ejection fraction, by estimation, is 50 to 55%. The left ventricle has low normal function. The left ventricle demonstrates regional wall motion abnormalities. Definity contrast agent was given IV to delineate the left  ventricular endocardial borders. The left ventricular internal cavity size was normal in size. There is no left ventricular hypertrophy. Left ventricular diastolic parameters are indeterminate.  Right Ventricle: The right ventricular size is normal. No increase in right ventricular wall thickness. Right ventricular systolic function is normal. There is normal pulmonary artery systolic pressure. The tricuspid regurgitant velocity is 2.34 m/s, and  with an assumed right atrial pressure of 8 mmHg, the estimated right ventricular systolic pressure is 29.9 mmHg. Left Atrium: Left atrial size was moderately dilated. Right Atrium: Right atrial size was moderately dilated. Pericardium: There is no evidence of pericardial effusion. Mitral Valve: Color flow interrogation of MV not very good due to poor image quality MR previously described as moderate on TEE 02/2020. The mitral valve was not well visualized. There is mild thickening of the mitral valve leaflet(s). There is mild calcification of the mitral valve leaflet(s). Moderate mitral annular calcification. Mild mitral valve regurgitation. No evidence of mitral valve stenosis. Tricuspid Valve: The tricuspid valve is normal in structure. Tricuspid valve regurgitation is moderate . No evidence of tricuspid stenosis. Aortic Valve: The aortic valve is tricuspid. There is moderate calcification of the aortic valve. There is moderate thickening of the aortic valve. There is moderate aortic valve annular calcification. Aortic valve regurgitation is not visualized. Mild aortic stenosis is present. Aortic valve mean gradient measures 13.6 mmHg. Aortic valve peak gradient measures 27.2 mmHg. Aortic valve area, by VTI measures 2.33 cm. Pulmonic Valve: The pulmonic valve was normal in structure. Pulmonic valve regurgitation is not visualized. No evidence of pulmonic stenosis. Aorta: The aortic root is normal in size and structure and aortic dilatation noted. There is mild dilatation  of the aortic root, measuring 40 mm. Venous: The inferior vena cava is normal in size with greater than 50% respiratory variability, suggesting right atrial pressure of 3 mmHg. IAS/Shunts: No atrial level shunt detected by color flow Doppler.  LEFT VENTRICLE PLAX 2D LVIDd:         5.00 cm  Diastology LVIDs:         3.80 cm  LV e' medial:    6.31 cm/s LV PW:         0.80 cm  LV E/e' medial:  23.1 LV IVS:        1.10 cm  LV e' lateral:   8.16 cm/s LVOT diam:     2.40 cm  LV E/e' lateral: 17.8 LV SV:         119 LV SV Index:   49 LVOT Area:     4.52 cm  RIGHT VENTRICLE RV S prime:     11.30 cm/s TAPSE (M-mode): 2.1 cm LEFT ATRIUM           Index       RIGHT ATRIUM           Index LA diam:      4.30 cm 1.78 cm/m  RA Area:     20.60 cm LA Vol (A2C): 83.8 ml 34.62 ml/m RA Volume:   50.40 ml  20.82 ml/m  AORTIC VALVE AV Area (Vmax):    2.45 cm AV Area (Vmean):   2.70 cm AV Area (VTI):     2.33 cm AV Vmax:           260.60 cm/s AV Vmean:          166.800 cm/s AV VTI:            0.509 m AV Peak Grad:      27.2 mmHg AV Mean Grad:      13.6 mmHg LVOT Vmax:         141.00 cm/s  LVOT Vmean:        99.400 cm/s LVOT VTI:          0.262 m LVOT/AV VTI ratio: 0.52  AORTA Ao Root diam: 4.00 cm Ao Asc diam:  3.60 cm MITRAL VALVE                TRICUSPID VALVE MV Area (PHT): 5.77 cm     TR Peak grad:   21.9 mmHg MV Decel Time: 132 msec     TR Vmax:        234.00 cm/s MV E velocity: 145.50 cm/s                             SHUNTS                             Systemic VTI:  0.26 m                             Systemic Diam: 2.40 cm Charlton Haws MD Electronically signed by Charlton Haws MD Signature Date/Time: 08/11/2020/1:23:12 PM    Final      Medications:     Scheduled Medications: . apixaban  5 mg Oral BID  . atorvastatin  80 mg Oral QHS  . carvedilol  6.25 mg Oral BID WC  . cholecalciferol  1,000 Units Oral Daily  . clopidogrel  75 mg Oral Daily  . empagliflozin  10 mg Oral Daily  . ezetimibe  10 mg Oral QHS  .  furosemide  80 mg Intravenous BID  . icosapent Ethyl  2 g Oral BID  . insulin aspart  0-5 Units Subcutaneous QHS  . insulin aspart  0-9 Units Subcutaneous TID WC  . mouth rinse  15 mL Mouth Rinse BID  . metolazone  2.5 mg Oral Once  . mometasone-formoterol  2 puff Inhalation BID  . potassium chloride  40 mEq Oral Once  . ranolazine  500 mg Oral BID  . sacubitril-valsartan  1 tablet Oral BID    Infusions: . amiodarone 30 mg/hr (08/12/20 0400)    PRN Medications:      Assessment/Plan   1. Acute on chronic systolic CHF: Prior ischemic cardiomyopathy, improved after PCI in 2017. Echo 03/2018 with EF back down to 35-40% and moderate-severe RV dysfunction. This was in the setting of atrial fibrillation with RVR. EF 40% on TEE 04/2018.  Echo was done in 3/20, showing EF up to 50%.  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  Echo in 7/21 with EF 25-30%, severe RV dysfunction.  TEE in 10/21, however, showed EF up to 50%.  He had PCI to LAD in 10/21.  Echo 08/12/20 with EF stable 50-55%, normal RV.  He is now back in NSR.  Oxygen saturation remains low and by exam he looks volume overloaded.   - Repeat CXR PA/lateral.  PCT was < 0.1 so doubt bacterial PNA.  - Continue Lasix 80 mg IV bid and will give a dose of metolazone 2.5 x 1 today.   - Increase Entresto to home dose 49/51 bid.   -Continue Coreg at lower dose for now 6.25 mg bid.  - Continue empagliflozin 10 mg daily.  2. Atrial fibrillation/Atrial flutter: As above, concerned for possible tachy-mediated CMP in past. Had successful DC-CV on 03/29/18 but then back in atrial fibrillation.Ranolazine  added, but failed DCCV again 04/02/18. DCCV 04/19/18 was successful and he remained in NSR on amiodarone + ranolazine. He opted against atrial fibrillation ablation in the past (saw Dr. Johney Frame).  He was noted to be in atrial fibrillation at last clinic visit and plan was made for DCCV if he did not convert, but he was  admitted with syncope and rapid SVT in 200s yesterday.  SVT was regular, ?1:1 atrial flutter.  In atrial flutter rate 100s on amiodarone gtt => DCCV to NSR on 08/11/20. NSR today.  - Continue ranolazine 500 mg bid.  - Continue IV amiodarone today, to po tomorrow.  - Continue apixaban.   - Discussed with EP (Dr. Elberta Fortis).  He recommends DCCV only this admission, then close followup with Dr. Johney Frame as patient is going to need atrial fibrillation + atrial flutter ablation.  Prefers to keep him on amiodarone 200 mg bid + ranolazine then set up procedure as outpatient.  3. CKD: Stage 3.  Creatinine stable at 1.6, follow.  4. CAD: History of DES to RCA in 9/17. UnderwentLHC11/20/19 with PCI to prox left circ.LHC in 10/21 with PCI to LAD.  No chest pain.  -He is on Eliquis so no ASA.   - Continue Plavix ideally x 1 yr (to 10/22).  - Continue atorvastatin and vascepa.  5. DM2: Per primary.  6. Aortic stenosis: Mild by echo this admission.   7. Mitral regurgitation: Mild by echo this admission.   Length of Stay: 3  [name], MD  08/12/2020, 9:04 AM  Advanced Heart Failure Team Pager [phone] (M-F; 7a - 5p)  Please contact [facility] for night-coverage after hours (5p -7a ) and weekends on [email or web address]

## 2020-08-12 NOTE — Progress Notes (Signed)
Placed patient on bipap for the night  

## 2020-08-12 NOTE — Progress Notes (Signed)
[facility] night shift.  The nursing staff reported that the patient has been having issues with oxygenation hypoventilation in the setting of acute combined systolic/diastolic CHF, history of asthma and class II obesity.  BiPAP at bedtime has been ordered.  Sanda Klein, MD.

## 2020-08-12 NOTE — Plan of Care (Signed)
°  Problem: Education: °Goal: Knowledge of General Education information will improve °Description: Including pain rating scale, medication(s)/side effects and non-pharmacologic comfort measures °Outcome: Progressing °  °Problem: Health Behavior/Discharge Planning: °Goal: Ability to manage health-related needs will improve °Outcome: Progressing °  °Problem: Nutrition: °Goal: Adequate nutrition will be maintained °Outcome: Progressing °  °Problem: Coping: °Goal: Level of anxiety will decrease °Outcome: Progressing °  °Problem: Elimination: °Goal: Will not experience complications related to bowel motility °Outcome: Progressing °Goal: Will not experience complications related to urinary retention °Outcome: Progressing °  °Problem: Pain Managment: °Goal: General experience of comfort will improve °Outcome: Progressing °  °Problem: Safety: °Goal: Ability to remain free from injury will improve °Outcome: Progressing °  °Problem: Skin Integrity: °Goal: Risk for impaired skin integrity will decrease °Outcome: Progressing °  °

## 2020-08-12 NOTE — Progress Notes (Signed)
PROGRESS NOTE    David CAHALAN  ZOX:[id number] DOB: [birth year] DOA: 08/09/2020 PCP: Bradd Canary, MD   Brief Narrative:  HPI on 08/09/2020 by Dr. Benita Gutter David Huynh is a 80 y.o. male with medical history significant for CAD, chronic systolic CHF,PAF,HTN, HLD, DM, moderate AS, CKD 3, mild persistent asthma who presents with concerns of increasing shortness of breath and syncope. About 2 days ago he began to note worsening shortness of breath with exertion and then progressively became short of breath even with just standing.  Today he got up out of bed and was getting ready to follow up with cardiology when he felt dizzy with heart palpitations and fell back onto his bed with loss of consciousness.  Wife called EMS afterwards.  Noted to be in atrial fibrillation/atrial flutter with rates of 100-120 by EMS.  CBG of 340.  Also noted to have some orthostatic changes from laying to sitting going from systolic of 140-1 08.  He has been having some increasing lower extremity and abdominal edema for past several weeks.He was last seen by cardiology on 07/22/20 and has been progressively getting increase in Torsemide since January due to increase in wight. Also had increase in his amiodarone since he was noted to be in atrial flutter during that visit. He is followed by endocrinology for his diabetes and previously had his Metformin reduced due to worsening GFR of less than 45.  Interim history Patient presented with worsening shortness of breath and syncope.  Found to have atrial fibrillation/flutter along with orthostatic changes.  Cardiology was consulted and appreciated, currently being treated for CHF exacerbation along with A. fib/a flutter.  Status post cardioversion and in sinus rhythm.  Assessment & Plan   Paroxysmal atrial fibrillation/flutter with RVR -Patient noted to have heart rates of up to 220s with no response to vagal maneuvers.  He was treated with adenosine x2 and was  noted to have a drop in blood pressure. -CCM was consulted and patient was placed on IV amiodarone bolus and continued drip. -Cardiology consulted and appreciated -Status post cardioversion 08/11/20- remains in sinus rhythm today -Patient will need follow-up with electrophysiology -Continue Eliquis -Cardiology planning on transitioning to oral amiodarone on 08/13/2020  Acute combined systolic and diastolic heart failure/acute respiratory failure with hypoxia -Patient was noted to have increased shortness of breath and was hypoxic in the 80s and required supplemental oxygen.  Does not use oxygen at home. -Prior to admission, torsemide was increased to 60 mg twice daily by cardiology. -Echocardiogram shows an EF of 50 to 55%, LV demonstrates regional wall motion abnormalities.  LV diastolic parameters indeterminate. -Chest x-ray on admission showed bilateral pleural effusions -Continue monitor intake and output, daily weights -Placed on IV Lasix 80 mg twice daily. Cardiology adding one dose of metolazone today -Continue supplemental oxygen, and wean as possible- currently on HFNC 8L -Patient may need outpatient sleep study -Continue Entresto (Cardiology increasing dose), Coreg, Jardiance  Syncope with loss of consciousness -Likely secondary to the above -Echocardiogram is unable to visualize mitral valve.  Moderate tricuspid valve regurgitation.  Mild aortic valve stenosis.  Aortic dilatation measuring 40 mm.  Leukocytosis/thrombocytosis -Suspect reactive, no signs or source of infection at this time  Chronic kidney disease, stage IIIb -Creatinine appears to be stable, continue to monitor BMP  Anemia of chronic disease -Hemoglobin appears to be stable, continue monitor CBC  History of CAD -With drug-eluting stent -Currently no chest -Continue Coreg, Plavix, Eliquis, Ranexa  Diabetes mellitus, type  II -Hemoglobin A1c was 6.5 approximately 2 months ago -Continue insulin sliding scale  CBG monitoring   History of mild persistent asthma -Continue bronchodilators as needed and incentive spirometer -Currently no wheezing on exam  DVT Prophylaxis Eliquis  Code Status: Full  Family Communication: None at bedside  Disposition Plan:  Status is: Inpatient  Remains inpatient appropriate because:IV treatments appropriate due to intensity of illness or inability to take PO   Dispo: The patient is from: Home              Anticipated d/c is to: Home              Patient currently is not medically stable to d/c.   Difficult to place patient No   Consultants PCCM Cardiology  Procedures  Echocardiogram Cardioversion  Antibiotics   Anti-infectives (From admission, onward)   Start     Dose/Rate Route Frequency Ordered Stop   08/09/20 2015  cefTRIAXone (ROCEPHIN) 1 g in sodium chloride 0.9 % 100 mL IVPB        1 g 200 mL/hr over 30 Minutes Intravenous  Once 08/09/20 2004 08/09/20 2112   08/09/20 2015  azithromycin (ZITHROMAX) 500 mg in sodium chloride 0.9 % 250 mL IVPB        500 mg 250 mL/hr over 60 Minutes Intravenous  Once 08/09/20 2004 08/09/20 2135      Subjective:   [name] seen and examined today.   Currently denies chest pain or palpitations.  States he is feeling mildly better.  He does admit to getting short of breath on walking in the hall.  Denies current abdominal pain, nausea or vomiting, diarrhea or constipation, dizziness or headache.    Objective:   Vitals:   08/12/20 0500 08/12/20 0741 08/12/20 0744 08/12/20 0810  BP:  125/62    Pulse:  78    Resp:  (!) 23    Temp:  98.5 F (36.9 C)    TempSrc:  Oral    SpO2:  91% 92% 92%  Weight: 127.5 kg     Height:        Intake/Output Summary (Last 24 hours) at 08/12/2020 1008 Last data filed at 08/12/2020 0800 Gross per 24 hour  Intake 1385.19 ml  Output 3550 ml  Net -2164.81 ml   Filed Weights   08/11/20 0300 08/11/20 0824 08/12/20 0500  Weight: 130.7 kg 122.5 kg 127.5 kg    Exam  General: Well developed, chronically ill-appearing, NAD  HEENT: NCAT, mucous membranes moist.   Cardiovascular: S1 S2 auscultated, RRR  Respiratory: Diminished breath sounds, basilar crackles  Abdomen: Soft, nontender, nondistended, + bowel sounds  Extremities: warm dry without cyanosis clubbing.  LE edema bilaterally  Neuro: AAOx3, nonfocal  Psych: Normal affect and demeanor with intact judgement and insight  Data Reviewed: I have personally reviewed following labs and imaging studies  CBC: Recent Labs  Lab 08/09/20 1643 08/10/20 0541 08/11/20 0139 08/12/20 0102  WBC 10.7* 13.3* 12.6* 13.4*  NEUTROABS 8.6*  --  9.4*  --   HGB 11.5* 11.8* 11.6* 11.7*  HCT 35.0* 35.4* 35.2* 36.1*  MCV 95.6 95.7 95.4 97.6  PLT 394 458* 477* 496*   Basic Metabolic Panel: Recent Labs  Lab 08/09/20 1800 08/10/20 0541 08/11/20 0139 08/12/20 0102  NA 137 141 140 140  K 4.2 3.8 3.4* 3.8  CL 105 105 105 106  CO2 23 25 25 25   GLUCOSE 187* 120* 132* 130*  BUN 21 17 19 21   CREATININE 1.73*  1.62* 1.63* 1.68*  CALCIUM 8.3* 8.3* 8.2* 8.3*  MG  --   --  2.5* 2.6*   GFR: Estimated Creatinine Clearance: 49.2 mL/min (A) (by C-G formula based on SCr of 1.68 mg/dL (H)). Liver Function Tests: Recent Labs  Lab 08/10/20 1300 08/11/20 0139  AST 23 28  ALT 21 25  ALKPHOS 64 65  BILITOT 0.8 0.8  PROT 6.9 6.5  ALBUMIN 2.7* 2.6*   No results for input(s): LIPASE, AMYLASE in the last 168 hours. No results for input(s): AMMONIA in the last 168 hours. Coagulation Profile: No results for input(s): INR, PROTIME in the last 168 hours. Cardiac Enzymes: No results for input(s): CKTOTAL, CKMB, CKMBINDEX, TROPONINI in the last 168 hours. BNP (last 3 results) Recent Labs    12/17/19 1327  PROBNP 593.0*   HbA1C: No results for input(s): HGBA1C in the last 72 hours. CBG: Recent Labs  Lab 08/11/20 0539 08/11/20 1147 08/11/20 1544 08/11/20 2105 08/12/20 0608  GLUCAP 132* 163* 161*  133* 128*   Lipid Profile: No results for input(s): CHOL, HDL, LDLCALC, TRIG, CHOLHDL, LDLDIRECT in the last 72 hours. Thyroid Function Tests: No results for input(s): TSH, T4TOTAL, FREET4, T3FREE, THYROIDAB in the last 72 hours. Anemia Panel: No results for input(s): VITAMINB12, FOLATE, FERRITIN, TIBC, IRON, RETICCTPCT in the last 72 hours. Urine analysis:    Component Value Date/Time   COLORURINE STRAW (A) 03/25/2018 1627   APPEARANCEUR CLEAR 03/25/2018 1627   LABSPEC >1.030 (H) 03/25/2018 1627   PHURINE 5.5 03/25/2018 1627   GLUCOSEU 100 (A) 03/25/2018 1627   HGBUR TRACE (A) 03/25/2018 1627   BILIRUBINUR NEGATIVE 03/25/2018 1627   KETONESUR NEGATIVE 03/25/2018 1627   PROTEINUR 100 (A) 03/25/2018 1627   NITRITE NEGATIVE 03/25/2018 1627   LEUKOCYTESUR NEGATIVE 03/25/2018 1627   Sepsis Labs: @LABRCNTIP (procalcitonin:4,lacticidven:4)  ) Recent Results (from the past 240 hour(s))  Resp Panel by RT-PCR (Flu A&B, Covid) Nasopharyngeal Swab     Status: None   Collection Time: 08/09/20  5:09 PM   Specimen: Nasopharyngeal Swab; Nasopharyngeal(NP) swabs in vial transport medium  Result Value Ref Range Status   SARS Coronavirus 2 by RT PCR NEGATIVE NEGATIVE Final    Comment: (NOTE) SARS-CoV-2 target nucleic acids are NOT DETECTED.  The SARS-CoV-2 RNA is generally detectable in upper respiratory specimens during the acute phase of infection. The lowest concentration of SARS-CoV-2 viral copies this assay can detect is 138 copies/mL. A negative result does not preclude SARS-Cov-2 infection and should not be used as the sole basis for treatment or other patient management decisions. A negative result may occur with  improper specimen collection/handling, submission of specimen other than nasopharyngeal swab, presence of viral mutation(s) within the areas targeted by this assay, and inadequate number of viral copies(<138 copies/mL). A negative result must be combined with clinical  observations, patient history, and epidemiological information. The expected result is Negative.  Fact Sheet for Patients:  08/11/20  Fact Sheet for Healthcare Providers:  BloggerCourse.com  This test is no t yet approved or cleared by the [place] FDA and  has been authorized for detection and/or diagnosis of SARS-CoV-2 by FDA under an Emergency Use Authorization (EUA). This EUA will remain  in effect (meaning this test can be used) for the duration of the COVID-19 declaration under Section 564(b)(1) of the Act, 21 U.S.C.section 360bbb-3(b)(1), unless the authorization is terminated  or revoked sooner.       Influenza A by PCR NEGATIVE NEGATIVE Final   Influenza B by PCR  NEGATIVE NEGATIVE Final    Comment: (NOTE) The Xpert Xpress SARS-CoV-2/FLU/RSV plus assay is intended as an aid in the diagnosis of influenza from Nasopharyngeal swab specimens and should not be used as a sole basis for treatment. Nasal washings and aspirates are unacceptable for Xpert Xpress SARS-CoV-2/FLU/RSV testing.  Fact Sheet for Patients: BloggerCourse.comhttps://www.fda.gov/media/152166/download  Fact Sheet for Healthcare Providers: SeriousBroker.ithttps://www.fda.gov/media/152162/download  This test is not yet approved or cleared by the [place] FDA and has been authorized for detection and/or diagnosis of SARS-CoV-2 by FDA under an Emergency Use Authorization (EUA). This EUA will remain in effect (meaning this test can be used) for the duration of the COVID-19 declaration under Section 564(b)(1) of the Act, 21 U.S.C. section 360bbb-3(b)(1), unless the authorization is terminated or revoked.  Performed at [facility], [address] [place], [place] [address]   Culture, blood (routine x 2)     Status: None (Preliminary result)   Collection Time: 08/09/20  8:15 PM   Specimen: BLOOD  Result Value Ref Range Status   Specimen Description BLOOD LEFT  ANTECUBITAL  Final   Special Requests   Final    BOTTLES DRAWN AEROBIC AND ANAEROBIC Blood Culture adequate volume   Culture   Final    NO GROWTH 3 DAYS Performed at [facility], [address] [place], [place] [address]    Report Status PENDING  Incomplete  Culture, blood (routine x 2)     Status: None (Preliminary result)   Collection Time: 08/09/20  8:20 PM   Specimen: BLOOD  Result Value Ref Range Status   Specimen Description BLOOD RIGHT ANTECUBITAL  Final   Special Requests   Final    BOTTLES DRAWN AEROBIC AND ANAEROBIC Blood Culture adequate volume   Culture   Final    NO GROWTH 3 DAYS Performed at [facility], [address] [place], [place] [address]    Report Status PENDING  Incomplete  MRSA PCR Screening     Status: None   Collection Time: 08/11/20  1:08 AM   Specimen: Nasal Mucosa; Nasopharyngeal  Result Value Ref Range Status   MRSA by PCR NEGATIVE NEGATIVE Final    Comment:        The GeneXpert MRSA Assay (FDA approved for NASAL specimens only), is one component of a comprehensive MRSA colonization surveillance program. It is not intended to diagnose MRSA infection nor to guide or monitor treatment for MRSA infections. Performed at [facility], [address] [place], [place] [address]       Radiology Studies: ECHOCARDIOGRAM COMPLETE  Result Date: 08/11/2020    ECHOCARDIOGRAM REPORT   Patient Name:   Everardo BealsCHARLES E Wojdyla Date of Exam: 08/11/2020 Medical Rec #:  [id number]         Height:       72.0 in Accession #:    [id number]        Weight:       270.0 lb Date of Birth:  11-01-1940         BSA:          2.420 m Patient Age:    80 years          BP:           157/96 mmHg Patient Gender: M                 HR:           92 bpm. Exam Location:  Inpatient  Procedure: 2D Echo, Cardiac Doppler, Color Doppler and Intracardiac            Opacification Agent Indications:    CHF-Acute Systolic I50.21  History:        Patient has prior history of  Echocardiogram examinations, most                 recent 03/30/2020. CHF, CAD, Arrythmias:Atrial Fibrillation;                 Risk Factors:Dyslipidemia, Diabetes and Hypertension. Ischemic                 Cardiomyopathy.  Sonographer:    Leta Jungling RDCS Referring Phys: [id number] CHING T TU  Sonographer Comments: Suboptimal apical window. Image acquisition challenging due to respiratory motion. IMPRESSIONS  1. Inferior basal hypokinesis . Left ventricular ejection fraction, by estimation, is 50 to 55%. The left ventricle has low normal function. The left ventricle demonstrates regional wall motion abnormalities (see scoring diagram/findings for description). Left ventricular diastolic parameters are indeterminate.  2. Right ventricular systolic function is normal. The right ventricular size is normal. There is normal pulmonary artery systolic pressure.  3. Left atrial size was moderately dilated.  4. Right atrial size was moderately dilated.  5. Color flow interrogation of MV not very good due to poor image quality MR previously described as moderate on TEE 02/2020 . The mitral valve was not well visualized. Mild mitral valve regurgitation. No evidence of mitral stenosis. Moderate mitral annular calcification.  6. Tricuspid valve regurgitation is moderate.  7. The aortic valve is tricuspid. There is moderate calcification of the aortic valve. There is moderate thickening of the aortic valve. Aortic valve regurgitation is not visualized. Mild aortic valve stenosis.  8. Aortic dilatation noted. There is mild dilatation of the aortic root, measuring 40 mm.  9. The inferior vena cava is normal in size with greater than 50% respiratory variability, suggesting right atrial pressure of 3 mmHg. FINDINGS  Left Ventricle: Inferior basal hypokinesis. Left ventricular ejection fraction, by estimation, is 50 to 55%. The left ventricle has low normal function. The left ventricle demonstrates regional wall motion abnormalities.  Definity contrast agent was given IV to delineate the left ventricular endocardial borders. The left ventricular internal cavity size was normal in size. There is no left ventricular hypertrophy. Left ventricular diastolic parameters are indeterminate. Right Ventricle: The right ventricular size is normal. No increase in right ventricular wall thickness. Right ventricular systolic function is normal. There is normal pulmonary artery systolic pressure. The tricuspid regurgitant velocity is 2.34 m/s, and  with an assumed right atrial pressure of 8 mmHg, the estimated right ventricular systolic pressure is 29.9 mmHg. Left Atrium: Left atrial size was moderately dilated. Right Atrium: Right atrial size was moderately dilated. Pericardium: There is no evidence of pericardial effusion. Mitral Valve: Color flow interrogation of MV not very good due to poor image quality MR previously described as moderate on TEE 02/2020. The mitral valve was not well visualized. There is mild thickening of the mitral valve leaflet(s). There is mild calcification of the mitral valve leaflet(s). Moderate mitral annular calcification. Mild mitral valve regurgitation. No evidence of mitral valve stenosis. Tricuspid Valve: The tricuspid valve is normal in structure. Tricuspid valve regurgitation is moderate . No evidence of tricuspid stenosis. Aortic Valve: The aortic valve is tricuspid. There is moderate calcification of the aortic valve. There is moderate thickening of the aortic valve. There is moderate aortic valve annular calcification. Aortic valve regurgitation  is not visualized. Mild aortic stenosis is present. Aortic valve mean gradient measures 13.6 mmHg. Aortic valve peak gradient measures 27.2 mmHg. Aortic valve area, by VTI measures 2.33 cm. Pulmonic Valve: The pulmonic valve was normal in structure. Pulmonic valve regurgitation is not visualized. No evidence of pulmonic stenosis. Aorta: The aortic root is normal in size and  structure and aortic dilatation noted. There is mild dilatation of the aortic root, measuring 40 mm. Venous: The inferior vena cava is normal in size with greater than 50% respiratory variability, suggesting right atrial pressure of 3 mmHg. IAS/Shunts: No atrial level shunt detected by color flow Doppler.  LEFT VENTRICLE PLAX 2D LVIDd:         5.00 cm  Diastology LVIDs:         3.80 cm  LV e' medial:    6.31 cm/s LV PW:         0.80 cm  LV E/e' medial:  23.1 LV IVS:        1.10 cm  LV e' lateral:   8.16 cm/s LVOT diam:     2.40 cm  LV E/e' lateral: 17.8 LV SV:         119 LV SV Index:   49 LVOT Area:     4.52 cm  RIGHT VENTRICLE RV S prime:     11.30 cm/s TAPSE (M-mode): 2.1 cm LEFT ATRIUM           Index       RIGHT ATRIUM           Index LA diam:      4.30 cm 1.78 cm/m  RA Area:     20.60 cm LA Vol (A2C): 83.8 ml 34.62 ml/m RA Volume:   50.40 ml  20.82 ml/m  AORTIC VALVE AV Area (Vmax):    2.45 cm AV Area (Vmean):   2.70 cm AV Area (VTI):     2.33 cm AV Vmax:           260.60 cm/s AV Vmean:          166.800 cm/s AV VTI:            0.509 m AV Peak Grad:      27.2 mmHg AV Mean Grad:      13.6 mmHg LVOT Vmax:         141.00 cm/s LVOT Vmean:        99.400 cm/s LVOT VTI:          0.262 m LVOT/AV VTI ratio: 0.52  AORTA Ao Root diam: 4.00 cm Ao Asc diam:  3.60 cm MITRAL VALVE                TRICUSPID VALVE MV Area (PHT): 5.77 cm     TR Peak grad:   21.9 mmHg MV Decel Time: 132 msec     TR Vmax:        234.00 cm/s MV E velocity: 145.50 cm/s                             SHUNTS                             Systemic VTI:  0.26 m                             Systemic Diam: 2.40  cm Charlton Haws MD Electronically signed by Charlton Haws MD Signature Date/Time: 08/11/2020/1:23:12 PM    Final      Scheduled Meds: . apixaban  5 mg Oral BID  . atorvastatin  80 mg Oral QHS  . carvedilol  6.25 mg Oral BID WC  . cholecalciferol  1,000 Units Oral Daily  . clopidogrel  75 mg Oral Daily  . empagliflozin  10 mg Oral Daily   . ezetimibe  10 mg Oral QHS  . furosemide  80 mg Intravenous BID  . icosapent Ethyl  2 g Oral BID  . insulin aspart  0-5 Units Subcutaneous QHS  . insulin aspart  0-9 Units Subcutaneous TID WC  . mouth rinse  15 mL Mouth Rinse BID  . metolazone  2.5 mg Oral Once  . mometasone-formoterol  2 puff Inhalation BID  . ranolazine  500 mg Oral BID  . sacubitril-valsartan  1 tablet Oral BID   Continuous Infusions: . amiodarone 30 mg/hr (08/12/20 0940)     LOS: 3 days   Time Spent in minutes   45 minutes  Mateusz Neilan D.O. on 08/12/2020 at 10:08 AM  Between 7am to 7pm - Please see pager noted on [email or web address]  After 7pm go to [email or web address]  And look for the night coverage person covering for me after hours  [facility] Office  [phone]

## 2020-08-13 ENCOUNTER — Inpatient Hospital Stay (HOSPITAL_COMMUNITY): Payer: Medicare Other

## 2020-08-13 DIAGNOSIS — I4811 Longstanding persistent atrial fibrillation: Secondary | ICD-10-CM | POA: Diagnosis not present

## 2020-08-13 DIAGNOSIS — R55 Syncope and collapse: Secondary | ICD-10-CM | POA: Diagnosis not present

## 2020-08-13 DIAGNOSIS — I5043 Acute on chronic combined systolic (congestive) and diastolic (congestive) heart failure: Secondary | ICD-10-CM | POA: Diagnosis not present

## 2020-08-13 DIAGNOSIS — J9601 Acute respiratory failure with hypoxia: Secondary | ICD-10-CM

## 2020-08-13 DIAGNOSIS — I480 Paroxysmal atrial fibrillation: Secondary | ICD-10-CM | POA: Diagnosis not present

## 2020-08-13 DIAGNOSIS — I5021 Acute systolic (congestive) heart failure: Secondary | ICD-10-CM

## 2020-08-13 LAB — GLUCOSE, CAPILLARY
Glucose-Capillary: 157 mg/dL — ABNORMAL HIGH (ref 70–99)
Glucose-Capillary: 176 mg/dL — ABNORMAL HIGH (ref 70–99)
Glucose-Capillary: 151 mg/dL — ABNORMAL HIGH (ref 70–99)
Glucose-Capillary: 208 mg/dL — ABNORMAL HIGH (ref 70–99)

## 2020-08-13 LAB — BASIC METABOLIC PANEL
Anion gap: 10 (ref 5–15)
BUN: 23 mg/dL (ref 8–23)
CO2: 29 mmol/L (ref 22–32)
Calcium: 8.7 mg/dL — ABNORMAL LOW (ref 8.9–10.3)
Chloride: 100 mmol/L (ref 98–111)
Creatinine, Ser: 1.62 mg/dL — ABNORMAL HIGH (ref 0.61–1.24)
GFR, Estimated: 43 mL/min — ABNORMAL LOW (ref >60)
Glucose, Bld: 146 mg/dL — ABNORMAL HIGH (ref 70–99)
Potassium: 3.7 mmol/L (ref 3.5–5.1)
Sodium: 139 mmol/L (ref 135–145)

## 2020-08-13 LAB — SEDIMENTATION RATE
Sed Rate: 17 mm/hr — ABNORMAL HIGH (ref 0–16)
Sed Rate: 130 mm/hr — ABNORMAL HIGH (ref 0–16)

## 2020-08-13 LAB — PROCALCITONIN: Procalcitonin: 0.14 ng/mL

## 2020-08-13 MED ORDER — METOLAZONE 2.5 MG PO TABS: 2.5000 mg | ORAL_TABLET | Freq: Once | ORAL | Status: DC

## 2020-08-13 MED ORDER — SODIUM CHLORIDE 0.9 % IV SOLN
1.0000 g | INTRAVENOUS | Status: DC
  Administered 2020-08-13: 1 g via INTRAVENOUS
  Filled 2020-08-13 (×2): qty 10

## 2020-08-13 MED ORDER — SODIUM CHLORIDE 0.9 % IV SOLN
500.0000 mg | INTRAVENOUS | Status: DC
  Administered 2020-08-13 – 2020-08-14 (×2): 500 mg via INTRAVENOUS
  Filled 2020-08-13 (×2): qty 500

## 2020-08-13 MED ORDER — POTASSIUM CHLORIDE CRYS ER 20 MEQ PO TBCR
40.0000 meq | EXTENDED_RELEASE_TABLET | Freq: Once | ORAL | Status: AC
  Administered 2020-08-13: 40 meq via ORAL
  Filled 2020-08-13: qty 2

## 2020-08-13 MED ORDER — METHYLPREDNISOLONE SODIUM SUCC 40 MG IJ SOLR
40.0000 mg | Freq: Two times a day (BID) | INTRAMUSCULAR | Status: DC
  Administered 2020-08-13 – 2020-08-15 (×4): 40 mg via INTRAVENOUS
  Filled 2020-08-13 (×4): qty 1

## 2020-08-13 MED ORDER — AMIODARONE HCL 200 MG PO TABS
200.0000 mg | ORAL_TABLET | Freq: Two times a day (BID) | ORAL | Status: DC
  Administered 2020-08-13: 200 mg via ORAL
  Filled 2020-08-13: qty 1

## 2020-08-13 NOTE — Progress Notes (Signed)
Heart Failure Stewardship Pharmacist Progress Note  REDS Clip  READING= 30%  CHEST RULER = 36 Clip Station = D  Provider contacted: Robbie Lis, PA  Sharen Hones, PharmD, BCPS Heart Failure Stewardship Pharmacist Phone [phone]

## 2020-08-13 NOTE — Plan of Care (Signed)
  Problem: Clinical Measurements: Goal: Respiratory complications will improve Outcome: Progressing Goal: Cardiovascular complication will be avoided Outcome: Progressing   

## 2020-08-13 NOTE — Progress Notes (Signed)
ESR elevated at 130, in the setting of chronic AAD therapy w/ amiodarone. Concern for possible amiodarone pulmonary toxicity. Discussed results w/ Dr. Aundra Dubin. Will arrange for non contrast chest CT to further evaluate.   Lyda Jester, PA-C 08/13/2020

## 2020-08-13 NOTE — Consult Note (Signed)
NAME:  David Huynh, MRN:  [id number], DOB:  July 11, 1940, LOS: 4 ADMISSION DATE:  08/09/2020, CONSULTATION DATE:  4/1 REFERRING MD:  Ree Kida, CHIEF COMPLAINT:  Acute hypoxic resp failure    History of Present Illness:  80 year old male w/ marked cardiac hx (see below) admitted 3/28 for cc 2 d h/o inc SOB, palps and LE edema. then when getting up the am of 3/28 had syncopal event. EMS called. Admitted w/ working dx of CAP and treated w. IVF and abx. Cards consulted for SVT w/ HR 200s on 3/29. PCCM was actually consulted that day we gave amio bolus and pt improved. He was later seen by cardiology who felt this was primarily tachycardia related HF and he subsequently underwent DCCV on 3/30 w/ successful conversion to NSR. On 3/31 he still had exertional dyspnea diuretics continued. Noted to desaturate at night placed on BIPAP. As of 4/1 he is -4.2 liters w/ negative BNP, sed rate of 130 and had dense bilateral patchy airspace disease on film from 3/31. This was a little worse than the initial admitting cxr. He has remained on anticoagulation, he has had fairly aggressive diuresis, on 4/1 placed back on HD and rate is better controlled. In spite of these interventions his oxygen requirements have increased from 6l high flow to 14 which was the reason for PCCM re-consultation    Pertinent  Medical History  CAD DES RCA 17) PCI to left cor and PCI to LAD *2019). , HFrEF most recently EF 59% but has had several episodes since 2917 where it dropped in 12s.  Atrial fib/flutter  W/ prior failed DCCV CKD stage 3 (cr 1.6 range) HL DM type 2 AS  MR  Significant Hospital Events: Including procedures, antibiotic start and stop dates in addition to other pertinent events   . 3/28 admitted. Felt CAP got azith and ctx. Fluid bolus.  . 3/29 SVT in 200s. Seen by ccm. Amiodarone bolus given after rate controled BP better. Later seen by cards. Felt not PNA but more likely HR related CM and acute HF got lasix and  rate control . 3/30 underwent successful DCCV to NSR . 3/31 still hypoxic. CXR wore than on admit . 4/1 abx azithro and ceftriaxone reordered. PCT negative. Sed rate 130. Up from 6 l to 14 liters since admit (ins spite of being almost 5 liters neg) so PCCM consulted.   .   Interim History / Subjective:  Feels better but still short of breath  Objective   Blood pressure 130/65, pulse 72, temperature 98.8 F (37.1 C), temperature source Oral, resp. rate 20, height 6' (1.829 m), weight 125.6 kg, SpO2 (Abnormal) 88 %.    FiO2 (%):  [88 %] 88 %   Intake/Output Summary (Last 24 hours) at 08/13/2020 1355 Last data filed at 08/13/2020 0400 Gross per 24 hour  Intake 637.86 ml  Output 2250 ml  Net -1612.14 ml   Filed Weights   08/11/20 0824 08/12/20 0500 08/13/20 0258  Weight: 122.5 kg 127.5 kg 125.6 kg    Examination: General: otherwise healthy appearing male resting in bed. Not In acute distress HENT: NCAT no JVD  Lungs: fine crackles in bases. No accessory use no wheezing currently on 14 lpm Cardiovascular: RRR Abdomen: soft  Extremities: dry some residual edema. Brisk CR Neuro: awake and oriented  GU: voids  Labs/imaging that I havepersonally reviewed  (right click and "Reselect all SmartList Selections" daily)  See below   Resolved Hospital Problem list  SVT  Assessment & Plan:  Acute hypoxic respiratory failure  Bilateral pulmonary infiltrates  Drug induced Pneumonitis vs PNA (PNA seems unlikely) Pulmonary edema  Reported h/o asthma Afib w/ RVR/SVT-->now NSR H/o CAD w/ DES to RCA and prior PCI to Circ and LAD Acute heart failure w/ h/o HFrEF (most recent on 3/30 EF 50%) Mild AS Mild MR CKD stage III DM  Pulmonary Problem list  Acute hypoxic respiratory failure in setting of bilateral pulmonary infiltrates. Ddx: CAP (seems unlikely given not toxic appearing at all and PCT neg). His resp panel was neg for COVID and influenza but suppose other viral process a  possibility but also seems unlikely w/out other prodrome symptoms c/w URI. Pulmonary edema certainly a contributing factor but agree unlikely the primary reason. W/ elevated ESR and amiodarone I do think amiodarone related drug toxicity is a strong possibility and thus our focus change to this.   Plan Cont supplemental oxygen  Pulse ox Cont day 1 ceftriaxone and azith. Complete 5 days Stop amiodarone  Start IV solumedrol 35m bid. Once he starts to demonstrate clinical improvement we can begin slow taper this will be over several weeks to months Will check RF and ANCA as well as RVP  Best practice (right click and "Reselect all SmartList Selections" daily)  Per primary   Labs   CBC: Recent Labs  Lab 08/09/20 1643 08/10/20 0541 08/11/20 0139 08/12/20 0102  WBC 10.7* 13.3* 12.6* 13.4*  NEUTROABS 8.6*  --  9.4*  --   HGB 11.5* 11.8* 11.6* 11.7*  HCT 35.0* 35.4* 35.2* 36.1*  MCV 95.6 95.7 95.4 97.6  PLT 394 458* 477* 496*    Basic Metabolic Panel: Recent Labs  Lab 08/09/20 1800 08/10/20 0541 08/11/20 0139 08/12/20 0102 08/13/20 0022  NA 137 141 140 140 139  K 4.2 3.8 3.4* 3.8 3.7  CL 105 105 105 106 100  CO2 _0 GLUCOSE 187* 120* 132* 130* 146*  BUN _1 CREATININE 1.73* 1.62* 1.63* 1.68* 1.62*  CALCIUM 8.3* 8.3* 8.2* 8.3* 8.7*  MG  --   --  2.5* 2.6*  --    GFR: Estimated Creatinine Clearance: 50.6 mL/min (A) (by C-G formula based on SCr of 1.62 mg/dL (H)). Recent Labs  Lab 08/09/20 1643 08/09/20 1800 08/10/20 0541 08/11/20 0139 08/12/20 0102 08/13/20 0022  PROCALCITON  --  <0.10  --   --   --  0.14  WBC 10.7*  --  13.3* 12.6* 13.4*  --     Liver Function Tests: Recent Labs  Lab 08/10/20 1300 08/11/20 0139  AST 23 28  ALT 21 25  ALKPHOS 64 65  BILITOT 0.8 0.8  PROT 6.9 6.5  ALBUMIN 2.7* 2.6*   No results for input(s): LIPASE, AMYLASE in the last 168 hours. No results for input(s): AMMONIA in the last 168 hours.  ABG     Component Value Date/Time   PHART 7.434 01/25/2015 0350   PCO2ART 40.9 01/25/2015 0350   PO2ART 81.0 01/25/2015 0350   HCO3 28.7 (H) 03/02/2020 1034   HCO3 29.4 (H) 03/02/2020 1034   TCO2 30 03/02/2020 1034   TCO2 31 03/02/2020 1034   ACIDBASEDEF 1.0 01/24/2015 0826   O2SAT 70.0 03/02/2020 1034   O2SAT 71.0 03/02/2020 1034     Coagulation Profile: No results for input(s): INR, PROTIME in the last 168 hours.  Cardiac Enzymes: No results for input(s): CKTOTAL, CKMB, CKMBINDEX, TROPONINI in the last 168 hours.  HbA1C: Hemoglobin A1C  Date/Time Value Ref Range Status  05/25/2020 03:14 PM 6.5 (A) 4.0 - 5.6 % Final  02/24/2020 03:20 PM 6.8 (A) 4.0 - 5.6 % Final   Hgb A1c MFr Bld  Date/Time Value Ref Range Status  12/07/2019 05:26 AM 9.1 (H) 4.8 - 5.6 % Final    Comment:    (NOTE) Pre diabetes:          5.7%-6.4%  Diabetes:              >6.4%  Glycemic control for   <7.0% adults with diabetes   12/29/2016 10:33 AM 8.8 (H) 4.8 - 5.6 % Final    Comment:             Prediabetes: 5.7 - 6.4          Diabetes: >6.4          Glycemic control for adults with diabetes: <7.0     CBG: Recent Labs  Lab 08/12/20 1109 08/12/20 1546 08/12/20 2132 08/13/20 0604 08/13/20 1119  GLUCAP 193* 154* 143* 157* 176*    Review of Systems:   Review of Systems  Constitutional: Negative for fever, malaise/fatigue and weight loss.  HENT: Negative.   Eyes: Negative.   Respiratory: Positive for cough, shortness of breath and wheezing. Negative for sputum production.   Cardiovascular: Positive for chest pain, palpitations, leg swelling and PND.  Gastrointestinal: Negative for heartburn, nausea and vomiting.  Genitourinary: Negative.   Musculoskeletal: Negative.   Skin: Negative.   Neurological: Negative.   Endo/Heme/Allergies: Negative.   Psychiatric/Behavioral: Negative.      Past Medical History:  He,  has a past medical history of Allergy, Asthma, Atrial fibrillation with rapid  ventricular response ([place]) (03/25/2018), CAD, multiple vessel, Carotid bruit (08/05/2010), Chronic combined systolic and diastolic CHF (congestive heart failure) ([place]), CKD (chronic kidney disease), stage III ([place]), Diabetes mellitus, Fracture of multiple ribs (02/12/2015), Hemothorax on right (01/23/2015), Hyperlipidemia, Hypertension, Hypocalcemia (02/12/2015), Lactic acidosis (03/25/2018), Liver function study, abnormal (04/26/2011), Multiple rib fractures, Obesity, and Persistent atrial fibrillation ([place]).   Surgical History:   Past Surgical History:  Procedure Laterality Date  . APPENDECTOMY  1975  . CARDIAC CATHETERIZATION N/A 02/17/2016   Procedure: Left Heart Cath and Coronary Angiography;  Surgeon: Leonie Man, MD;  Location: [place] [facility];  Service: Cardiovascular: mRCA 85% (PCI), branch of OM2 80%,  ostD1 90%; EF 35-45%  . CARDIAC CATHETERIZATION N/A 02/17/2016   Procedure: Coronary Stent Intervention;  Surgeon: Leonie Man, MD;  Location: [place] [facility];  Service: Cardiovascular: mRCA PCI : STENT PROMUS PREM MR 3.5X28 DES  . CARDIOVERSION N/A 03/29/2018   Procedure: CARDIOVERSION;  Surgeon: Larey Dresser, MD;  Location: [facility] ENDOSCOPY;  Service: Cardiovascular;  Laterality: N/A;  . CARDIOVERSION N/A 04/02/2018   Procedure: CARDIOVERSION;  Surgeon: Larey Dresser, MD;  Location: [facility] ENDOSCOPY;  Service: Cardiovascular;  Laterality: N/A;  . CARDIOVERSION N/A 04/19/2018   Procedure: CARDIOVERSION;  Surgeon: Larey Dresser, MD;  Location: [facility] ENDOSCOPY;  Service: Cardiovascular;  Laterality: N/A;  . CARDIOVERSION N/A 08/11/2020   Procedure: CARDIOVERSION;  Surgeon: Larey Dresser, MD;  Location: [facility] ENDOSCOPY;  Service: Cardiovascular;  Laterality: N/A;  . CATARACT EXTRACTION  2010   b/l  . CORONARY STENT INTERVENTION N/A 04/03/2018   Procedure: CORONARY STENT INTERVENTION;  Surgeon: Troy Sine, MD;  Location: [place] [facility];  Service: Cardiovascular;  Laterality:  N/A;  . CORONARY STENT INTERVENTION N/A 03/02/2020   Procedure:  CORONARY STENT INTERVENTION;  Surgeon: Jettie Booze, MD;  Location: [place] [facility];  Service: Cardiovascular;  Laterality: N/A;  . INTRAVASCULAR ULTRASOUND/IVUS N/A 03/02/2020   Procedure: Intravascular Ultrasound/IVUS;  Surgeon: Jettie Booze, MD;  Location: [place] [facility];  Service: Cardiovascular;  Laterality: N/A;  . RETINAL LASER PROCEDURE  1985  . RIGHT/LEFT HEART CATH AND CORONARY ANGIOGRAPHY N/A 04/03/2018   Procedure: RIGHT/LEFT HEART CATH AND CORONARY ANGIOGRAPHY;  Surgeon: Larey Dresser, MD;  Location: [place] [facility];  Service: Cardiovascular;  Laterality: N/A;  . RIGHT/LEFT HEART CATH AND CORONARY ANGIOGRAPHY N/A 03/02/2020   Procedure: RIGHT/LEFT HEART CATH AND CORONARY ANGIOGRAPHY;  Surgeon: Larey Dresser, MD;  Location: [place] [facility];  Service: Cardiovascular;  Laterality: N/A;  . TEE WITHOUT CARDIOVERSION N/A 03/29/2018   Procedure: TRANSESOPHAGEAL ECHOCARDIOGRAM (TEE);  Surgeon: Larey Dresser, MD;  Location: [facility] ENDOSCOPY;  Service: Cardiovascular;  Laterality: N/A;  . TEE WITHOUT CARDIOVERSION N/A 04/19/2018   Procedure: TRANSESOPHAGEAL ECHOCARDIOGRAM (TEE);  Surgeon: Larey Dresser, MD;  Location: [facility] ENDOSCOPY;  Service: Cardiovascular;  Laterality: N/A;  . TEE WITHOUT CARDIOVERSION N/A 03/02/2020   Procedure: TRANSESOPHAGEAL ECHOCARDIOGRAM (TEE);  Surgeon: Larey Dresser, MD;  Location: [facility] ENDOSCOPY;  Service: Cardiovascular;  Laterality: N/A;  . TONSILLECTOMY AND ADENOIDECTOMY  1947  . TRANSTHORACIC ECHOCARDIOGRAM  02/2016   Mild LVH. Moderately reduced EF of 35-40% with diffuse hypokinesis. Indeterminate diastolic function. Likely elevated LA pressures. Mild aortic stenosis (mean gradient 10 mmHg). Mild MR. Mild LA dilation. Mild to moderate PA hypertension with 39 mmHg  . VIDEO ASSISTED THORACOSCOPY (VATS)/THOROCOTOMY Right 01/24/2015   Procedure: VIDEO ASSISTED  THORACOSCOPY (VATS) FOR DRAINAGE OF RIGHT HEMOTHORAX;  Surgeon: Gaye Pollack, MD;  Location: [place] OR;  Service: Thoracic;  Laterality: Right;     Social History:   reports that he has never smoked. He has never used smokeless tobacco. He reports current alcohol use. He reports that he does not use drugs.   Family History:  His family history is negative for CAD.   Allergies No Known Allergies   Home Medications  Prior to Admission medications   Medication Sig Start Date End Date Taking? Authorizing Provider  acetaminophen (TYLENOL) 500 MG tablet Take 1,000 mg by mouth every 8 (eight) hours as needed (for pain or headaches).   Yes [provider]  ADVAIR DISKUS 100-50 MCG/DOSE AEPB INHALE 1 PUFF INTO THE LUNGS TWICE DAILY Patient taking differently: Inhale 1 puff into the lungs 2 (two) times daily as needed (shortness of breath/wheezing). 07/05/20  Yes Saguier, Percell Miller, PA-C  amiodarone (PACERONE) 200 MG tablet Take 1 tablet (200 mg total) by mouth 2 (two) times daily. 07/22/20  Yes [place], Maricela Bo, FNP  atorvastatin (LIPITOR) 80 MG tablet Take 1 tablet (80 mg total) by mouth daily. 02/17/20 03/19/20 Yes Larey Dresser, MD  carvedilol (COREG) 12.5 MG tablet Take 1.5 tablets (18.75 mg total) by mouth 2 (two) times daily with a meal. 05/05/20  Yes Larey Dresser, MD  clopidogrel (PLAVIX) 75 MG tablet Take 1 tablet (75 mg total) by mouth daily. 06/03/20 06/03/21 Yes Larey Dresser, MD  ELIQUIS 5 MG TABS tablet TAKE 1 TABLET(5 MG) BY MOUTH TWICE DAILY Patient taking differently: Take 5 mg by mouth 2 (two) times daily. 05/25/20  Yes Larey Dresser, MD  empagliflozin (JARDIANCE) 10 MG TABS tablet Take 1 tablet (10 mg total) by mouth daily before breakfast. 07/22/20  Yes Clegg, Amy D, NP  [name]  49-51 MG TAKE 1 TABLET BY MOUTH TWICE DAILY Patient taking differently: Take 1 tablet by mouth 2 (two) times daily. 08/09/20  Yes Larey Dresser, MD  ezetimibe (ZETIA) 10 MG tablet Take 1  tablet (10 mg total) by mouth daily. 06/17/20  Yes Larey Dresser, MD  glipiZIDE (GLUCOTROL) 5 MG tablet Take 1 tablet (5 mg total) by mouth daily before breakfast. 02/24/20  Yes Shamleffer, Melanie Crazier, MD  icosapent Ethyl (VASCEPA) 1 g capsule Take 2 capsules (2 g total) by mouth 2 (two) times daily. 06/04/20  Yes Larey Dresser, MD  metFORMIN (GLUCOPHAGE) 1000 MG tablet Take 1 tablet (1,000 mg total) by mouth daily with breakfast. 03/04/20  Yes Larey Dresser, MD  nitroGLYCERIN (NITROSTAT) 0.4 MG SL tablet Place 1 tablet (0.4 mg total) under the tongue every 5 (five) minutes as needed for chest pain. 12/09/19  Yes Amin, Jeanella Flattery, MD  polyvinyl alcohol (ARTIFICIAL TEARS) 1.4 % ophthalmic solution Place 1 drop into both eyes daily as needed for dry eyes (irritation).   Yes [provider]  ranolazine (RANEXA) 500 MG 12 hr tablet Take 1 tablet (500 mg total) by mouth 2 (two) times daily. 07/08/20  Yes Larey Dresser, MD  torsemide (DEMADEX) 20 MG tablet Take 2 tablets (40 mg total) by mouth in the morning. 08/05/20  Yes Clegg, Amy D, NP  cholecalciferol (VITAMIN D) 1000 units tablet Take 1 tablet (1,000 Units total) by mouth daily. Patient not taking: Reported on 08/09/2020 12/09/19   Damita Lack, MD     Critical care time: NA   Erick Colace ACNP-BC [place] Pager # [phone] OR # [phone] if no answer

## 2020-08-13 NOTE — Plan of Care (Signed)
  Problem: Education: Goal: Knowledge of General Education information will improve Description: Including pain rating scale, medication(s)/side effects and non-pharmacologic comfort measures Outcome: Progressing   Problem: Health Behavior/Discharge Planning: Goal: Ability to manage health-related needs will improve Outcome: Progressing   Problem: Clinical Measurements: Goal: Diagnostic test results will improve Outcome: Progressing Goal: Cardiovascular complication will be avoided Outcome: Progressing   Problem: Nutrition: Goal: Adequate nutrition will be maintained Outcome: Progressing   Problem: Safety: Goal: Ability to remain free from injury will improve Outcome: Progressing   Problem: Clinical Measurements: Goal: Respiratory complications will improve Outcome: Not Progressing  Patient's on high flow at 10 L per minute saturating at 90%, at about 0900 high flow from 10 to 12 and saturating 88-90%. Antibiotics started this a.m. Diuresis continues. Patient does not use oxygen at home.

## 2020-08-13 NOTE — Progress Notes (Addendum)
Patient ID: David Huynh, male   DOB: 02-09-1941, 80 y.o.   MRN: [id number]     Advanced Heart Failure Rounding Note  PCP-Cardiologist: Loralie Champagne, MD   Subjective:    DCCV to NSR on 3/30.  Echo showed EF still in 50-55% range (stable) with normal RV, moderate biatrial enlargement, mild MR, moderate TR, mild AS.   - 3L in UOP yesterday w/ IV Lasix + metolazone. Wt down 5 lb. SCr stable at 1.6   Remains hypoxic w/ high supp O2 requirements. On 12L HFNC w/ O2 sats upper 80s-low 90s. Lung exam abnormal w/ bibasilar crackles   CXR yesterday showed small bilateral pleural effusions and stable bilateral lung opacities concerning for possible PNA, though PCT low <0.10. AF. WBC 13K   Remains in NSR. Adamant he want to go home today.    Objective:   Weight Range: 125.6 kg Body mass index is 37.55 kg/m.   Vital Signs:   Temp:  [97 F (36.1 C)-98 F (36.7 C)] 97 F (36.1 C) (04/01 0258) Pulse Rate:  [71-85] 75 (04/01 0258) Resp:  [18-23] 20 (04/01 0258) BP: (116-121)/(58-66) 120/62 (04/01 0258) SpO2:  [90 %-96 %] 92 % (04/01 0739) Weight:  [125.6 kg] 125.6 kg (04/01 0258) Last BM Date: 08/09/20  Weight change: Filed Weights   08/11/20 0824 08/12/20 0500 08/13/20 0258  Weight: 122.5 kg 127.5 kg 125.6 kg    Intake/Output:   Intake/Output Summary (Last 24 hours) at 08/13/2020 0814 Last data filed at 08/13/2020 0400 Gross per 24 hour  Intake 877.86 ml  Output 2550 ml  Net -1672.14 ml      Physical Exam   General:  Well appearing, moderately obese WM. No respiratory difficulty but on HFNC  HEENT: normal Neck: supple. JVD to jaw. Carotids 2+ bilat; no bruits. No lymphadenopathy or thyromegaly appreciated. Cor: PMI nondisplaced. Regular rate & rhythm. 2/6 SM RU/LUSB Lungs: bibasilar crackles that do not clear w/ cough  Abdomen: soft, nontender, nondistended. No hepatosplenomegaly. No bruits or masses. Good bowel sounds. Extremities: no cyanosis, clubbing, rash, 1+  bilateral ankle edema Neuro: alert & oriented x 3, cranial nerves grossly intact. moves all 4 extremities w/o difficulty. Affect pleasant.   Telemetry   NSR 70s (personally reviewed)   Labs    CBC Recent Labs    08/11/20 0139 08/12/20 0102  WBC 12.6* 13.4*  NEUTROABS 9.4*  --   HGB 11.6* 11.7*  HCT 35.2* 36.1*  MCV 95.4 97.6  PLT 477* 462*   Basic Metabolic Panel Recent Labs    08/11/20 0139 08/12/20 0102 08/13/20 0022  NA 140 140 139  K 3.4* 3.8 3.7  CL 105 106 100  CO2 $Re'25 25 29  'XjB$ GLUCOSE 132* 130* 146*  BUN $Re'19 21 23  'yOG$ CREATININE 1.63* 1.68* 1.62*  CALCIUM 8.2* 8.3* 8.7*  MG 2.5* 2.6*  --    Liver Function Tests Recent Labs    08/10/20 1300 08/11/20 0139  AST 23 28  ALT 21 25  ALKPHOS 64 65  BILITOT 0.8 0.8  PROT 6.9 6.5  ALBUMIN 2.7* 2.6*   No results for input(s): LIPASE, AMYLASE in the last 72 hours. Cardiac Enzymes No results for input(s): CKTOTAL, CKMB, CKMBINDEX, TROPONINI in the last 72 hours.  BNP: BNP (last 3 results) Recent Labs    12/06/19 1551 08/09/20 1643  BNP 792.4* 339.4*    ProBNP (last 3 results) Recent Labs    12/17/19 1327  PROBNP 593.0*     D-Dimer No results  for input(s): DDIMER in the last 72 hours. Hemoglobin A1C No results for input(s): HGBA1C in the last 72 hours. Fasting Lipid Panel No results for input(s): CHOL, HDL, LDLCALC, TRIG, CHOLHDL, LDLDIRECT in the last 72 hours. Thyroid Function Tests No results for input(s): TSH, T4TOTAL, T3FREE, THYROIDAB in the last 72 hours.  Invalid input(s): FREET3  Other results:   Imaging    DG Chest 2 View  Result Date: 08/12/2020 CLINICAL DATA:  Pneumonia. EXAM: CHEST - 2 VIEW COMPARISON:  August 09, 2020. FINDINGS: Stable cardiomegaly. No pneumothorax is noted. Stable bilateral perihilar and basilar opacities are noted concerning for pneumonia with possible small pleural effusions. Bony thorax is unremarkable. IMPRESSION: Stable bilateral lung opacities are noted  concerning for pneumonia and small pleural effusions. Stable cardiomegaly. Electronically Signed   By: Marijo Conception M.D.   On: 08/12/2020 11:08     Medications:     Scheduled Medications: . apixaban  5 mg Oral BID  . atorvastatin  80 mg Oral QHS  . carvedilol  6.25 mg Oral BID WC  . cholecalciferol  1,000 Units Oral Daily  . clopidogrel  75 mg Oral Daily  . empagliflozin  10 mg Oral Daily  . ezetimibe  10 mg Oral QHS  . furosemide  80 mg Intravenous BID  . icosapent Ethyl  2 g Oral BID  . insulin aspart  0-5 Units Subcutaneous QHS  . insulin aspart  0-9 Units Subcutaneous TID WC  . mouth rinse  15 mL Mouth Rinse BID  . mometasone-formoterol  2 puff Inhalation BID  . ranolazine  500 mg Oral BID  . sacubitril-valsartan  1 tablet Oral BID    Infusions: . amiodarone 30 mg/hr (08/13/20 0400)  . azithromycin    . cefTRIAXone (ROCEPHIN)  IV      PRN Medications:      Assessment/Plan   1. Acute on chronic systolic CHF: Prior ischemic cardiomyopathy, improved after PCI in 2017. Echo 03/2018 with EF back down to 35-40% and moderate-severe RV dysfunction. This was in the setting of atrial fibrillation with RVR. EF 40% on TEE 04/2018.  Echo was done in 3/20, showing EF up to 50%.  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  Echo in 7/21 with EF 25-30%, severe RV dysfunction.  TEE in 10/21, however, showed EF up to 50%.  He had PCI to LAD in 10/21.  Echo 08/12/20 with EF stable 50-55%, normal RV.  He is now back in NSR.  Oxygen saturation remains low and by exam he looks mildly volume overloaded, though suspect underlying pulmonary process is contributing to his hypoxia. Repeat CXR yesterday showed small bilateral pleural effusions but no overt edema - Check ReDs Clip reading today to help guide further diuresis w/ IV LAsix - Continue Entresto 49/51 bid.   -Continue Coreg at lower dose for now 6.25 mg bid.  - Continue empagliflozin 10 mg daily.  2.  Atrial fibrillation/Atrial flutter: As above, concerned for possible tachy-mediated CMP in past. Had successful DC-CV on 03/29/18 but then back in atrial fibrillation.Ranolazine added, but failed DCCV again 04/02/18. DCCV 04/19/18 was successful and he remained in NSR on amiodarone + ranolazine. He opted against atrial fibrillation ablation in the past (saw Dr. Rayann Heman).  He was noted to be in atrial fibrillation at last clinic visit and plan was made for DCCV if he did not convert, but he was admitted with syncope and rapid SVT in 200s yesterday.  SVT was regular, ?  1:1 atrial flutter.  In atrial flutter rate 100s on amiodarone gtt => DCCV to NSR on 08/11/20. NSR today.  - Continue ranolazine 500 mg bid.  - Switch to PO amiodarone 200 mg bid.  - Continue apixaban.   - Discussed with EP (Dr. Curt Bears).  He recommends DCCV only this admission, then close followup with Dr. Rayann Heman as patient is going to need atrial fibrillation + atrial flutter ablation.  Prefers to keep him on amiodarone 200 mg bid + ranolazine then set up procedure as outpatient.  - check ESR given severity of hypoxic + chronic amiodarone therapy  3. CKD: Stage 3.  Creatinine stable at 1.6, follow.  4. CAD: History of DES to RCA in 9/17. UnderwentLHC11/20/19 with PCI to prox left circ.LHC in 10/21 with PCI to LAD.  No chest pain.  -He is on Eliquis so no ASA.   - Continue Plavix ideally x 1 yr (to 10/22).  - Continue atorvastatin and vascepa.  5. DM2: Per primary.  6. Aortic stenosis: Mild by echo this admission.   7. Mitral regurgitation: Mild by echo this admission.  8. Acute Hypoxic Respiratory Failure - remains hypoxic w/ high O2 requirements, currently on 12L HFNC w/ O2 sats in upper 80s-low 90s - lung exam and CXR yesterday c/w PNA. Agree w/ initiation of abx, now on Rocephin + Azithromycin  - he has been on amiodarone long term, will check ESR to r/o amio pulmonary toxicity  - ? Residual fluid overload from CHF. Check ReDs  Clip, may need further diuresis    Length of Stay: [address] PA-C  08/13/2020, 8:14 AM  Advanced Heart Failure Team Pager [phone] (M-F; 7a - 5p)  Please contact [facility] for night-coverage after hours (5p -7a ) and weekends on [email or web address]  Patient seen with PA, agree with the above note.   He still is on HFNC, 12 L this morning.  He has diuresed well so far and weight is down.  Stable creatinine at 1.6.   He is afebrile with mildly elevated WBCs.  CXR shows bilateral infiltrates concerning for PNA.  Cultures NGTD.  He remains in NSR.   General: NAD Neck:  JVP 10 cm, no thyromegaly or thyroid nodule.  Lungs: Crackles at bases.  CV: Nondisplaced PMI.  Heart regular S1/S2, no S3/S4, no murmur.  1+ ankle edema.  Abdomen: Soft, nontender, no hepatosplenomegaly, no distention.  Skin: Intact without lesions or rashes.  Neurologic: Alert and oriented x 3.  Psych: Normal affect. Extremities: No clubbing or cyanosis.  HEENT: Normal.   I think he still has some volume overload on exam, but surprised that his oxygen saturation remains so low.  - Continue IV diuresis today with a dose of metolazone, but will check a REDS clip.  - RHC if he does not improve.  - Resend procalcitonin.  Based on CXR, I think it is reasonable to treat him for PNA => ceftriaxone/azithromycin ordered.  - ESR high, ?amiodarone lung toxicity => will get CT chest w/o contrast, pulmonary to see. Will stop amiodarone for now.   Loralie Champagne 08/13/2020  8:49 AM

## 2020-08-13 NOTE — Progress Notes (Signed)
PROGRESS NOTE    David Huynh  MLY:[id number] DOB: 05-19-1940 DOA: 08/09/2020 PCP: Bradd Canary, MD   Brief Narrative:  HPI on 08/09/2020 by Dr. Benita Gutter David Huynh is a 80 y.o. male with medical history significant for CAD, chronic systolic CHF,PAF,HTN, HLD, DM, moderate AS, CKD 3, mild persistent asthma who presents with concerns of increasing shortness of breath and syncope. About 2 days ago he began to note worsening shortness of breath with exertion and then progressively became short of breath even with just standing.  Today he got up out of bed and was getting ready to follow up with cardiology when he felt dizzy with heart palpitations and fell back onto his bed with loss of consciousness.  Wife called EMS afterwards.  Noted to be in atrial fibrillation/atrial flutter with rates of 100-120 by EMS.  CBG of 340.  Also noted to have some orthostatic changes from laying to sitting going from systolic of 140-1 08.  He has been having some increasing lower extremity and abdominal edema for past several weeks.He was last seen by cardiology on 07/22/20 and has been progressively getting increase in Torsemide since January due to increase in wight. Also had increase in his amiodarone since he was noted to be in atrial flutter during that visit. He is followed by endocrinology for his diabetes and previously had his Metformin reduced due to worsening GFR of less than 45.  Interim history Patient presented with worsening shortness of breath and syncope.  Found to have atrial fibrillation/flutter along with orthostatic changes.  Cardiology was consulted and appreciated, currently being treated for CHF exacerbation along with A. fib/a flutter.  Status post cardioversion and in sinus rhythm. Currently requiring HFNC Assessment & Plan   Paroxysmal atrial fibrillation/flutter with RVR -Patient noted to have heart rates of up to 220s with no response to vagal maneuvers.  He was treated  with adenosine x2 and was noted to have a drop in blood pressure. -CCM was consulted and patient was placed on IV amiodarone bolus and continued drip. -Cardiology consulted and appreciated -Status post cardioversion 08/11/20- remains in sinus rhythm today -Patient will need follow-up with electrophysiology -Continue Eliquis -Cardiology transitioned patient to oral amiodarone  Acute combined systolic and diastolic heart failure/acute respiratory failure with hypoxia -Patient was noted to have increased shortness of breath and was hypoxic in the 80s and required supplemental oxygen.  Does not use oxygen at home. -Prior to admission, torsemide was increased to 60 mg twice daily by cardiology. -Echocardiogram shows an EF of 50 to 55%, LV demonstrates regional wall motion abnormalities.  LV diastolic parameters indeterminate. -Chest x-ray on admission showed bilateral pleural effusions -Continue monitor intake and output, daily weights -Placed on IV Lasix 80 mg twice daily. Cardiology adding another dose of metolazone today and will check REDS clip -Continue Entresto (Cardiology increasing dose), Coreg, Jardiance -Continue supplemental oxygen, and wean as possible- currently on HFNC 12-14L -Overnight, BIPAP was attempted, however patient could not tolerate it -Discussed sleep study with the patient, he was adamant about not wanting to have one done and completely refuses  Pneumonia -CXR on 08/12/2020 concerning for pneumonia however procalcitonin unremarkable 0.14 -Currently afebrile -patient with leukocytosis however this may be reactive -have started patient on azithromycin and ceftriaxone given high oxygen demand and need -he wishes to be discharged home and let his PCP treat him  Syncope with loss of consciousness -Likely secondary to the above -Echocardiogram is unable to visualize mitral valve.  Moderate  tricuspid valve regurgitation.  Mild aortic valve stenosis.  Aortic dilatation  measuring 40 mm.  Leukocytosis/thrombocytosis -Suspect reactive, no signs or source of infection at this time  Chronic kidney disease, stage IIIb -Creatinine appears to be stable, continue to monitor BMP  Anemia of chronic disease -Hemoglobin appears to be stable, continue monitor CBC  History of CAD -With drug-eluting stent -Currently no chest -Continue Coreg, Plavix, Eliquis, Ranexa  Diabetes mellitus, type II -Hemoglobin A1c was 6.5 approximately 2 months ago -Continue insulin sliding scale CBG monitoring   History of mild persistent asthma -Continue bronchodilators as needed and incentive spirometer -Currently no wheezing on exam  DVT Prophylaxis Eliquis  Code Status: Full  Family Communication: None at bedside  Disposition Plan:  Status is: Inpatient  Remains inpatient appropriate because:IV treatments appropriate due to intensity of illness or inability to take PO   Dispo: The patient is from: Home              Anticipated d/c is to: Home              Patient currently is not medically stable to d/c.   Difficult to place patient No   Consultants PCCM Cardiology  Procedures  Echocardiogram Cardioversion  Antibiotics   Anti-infectives (From admission, onward)   Start     Dose/Rate Route Frequency Ordered Stop   08/13/20 0800  azithromycin (ZITHROMAX) 500 mg in sodium chloride 0.9 % 250 mL IVPB        500 mg 250 mL/hr over 60 Minutes Intravenous Every 24 hours 08/13/20 0720     08/13/20 0800  cefTRIAXone (ROCEPHIN) 1 g in sodium chloride 0.9 % 100 mL IVPB        1 g 200 mL/hr over 30 Minutes Intravenous Every 24 hours 08/13/20 0720     08/09/20 2015  cefTRIAXone (ROCEPHIN) 1 g in sodium chloride 0.9 % 100 mL IVPB        1 g 200 mL/hr over 30 Minutes Intravenous  Once 08/09/20 2004 08/09/20 2112   08/09/20 2015  azithromycin (ZITHROMAX) 500 mg in sodium chloride 0.9 % 250 mL IVPB        500 mg 250 mL/hr over 60 Minutes Intravenous  Once 08/09/20 2004  08/09/20 2135      Subjective:   David Huynh seen and examined today.   Patient would like to go home. States he will not consider a sleep study or BIPAP. Denies current chest pain. Denies abdominal pain, N/V/D/C.  Objective:   Vitals:   08/13/20 0258 08/13/20 0739 08/13/20 1117 08/13/20 1149  BP: 120/62  130/65   Pulse: 75  72   Resp: 20  20   Temp: (!) 97 F (36.1 C)  98.8 F (37.1 C)   TempSrc: Axillary  Oral   SpO2: 91% 92%  (!) 88%  Weight: 125.6 kg     Height:        Intake/Output Summary (Last 24 hours) at 08/13/2020 1338 Last data filed at 08/13/2020 0400 Gross per 24 hour  Intake 637.86 ml  Output 2250 ml  Net -1612.14 ml   Filed Weights   08/11/20 0824 08/12/20 0500 08/13/20 0258  Weight: 122.5 kg 127.5 kg 125.6 kg   Exam  General: Well developed, chronically ill-appearing, NAD  HEENT: NCAT, mucous membranes moist.   Cardiovascular: S1 S2 auscultated, RRR, 2/6 SEM  Respiratory: Diminished breath sounds, basilar crackles  Abdomen: Soft, nontender, nondistended, + bowel sounds  Extremities: warm dry without cyanosis clubbing. + LE  edema bilaterally  Neuro: AAOx3, nonfocal  Psych: Appropriate mood and affect  Data Reviewed: I have personally reviewed following labs and imaging studies  CBC: Recent Labs  Lab 08/09/20 1643 08/10/20 0541 08/11/20 0139 08/12/20 0102  WBC 10.7* 13.3* 12.6* 13.4*  NEUTROABS 8.6*  --  9.4*  --   HGB 11.5* 11.8* 11.6* 11.7*  HCT 35.0* 35.4* 35.2* 36.1*  MCV 95.6 95.7 95.4 97.6  PLT 394 458* 477* 496*   Basic Metabolic Panel: Recent Labs  Lab 08/09/20 1800 08/10/20 0541 08/11/20 0139 08/12/20 0102 08/13/20 0022  NA 137 141 140 140 139  K 4.2 3.8 3.4* 3.8 3.7  CL 105 105 105 106 100  CO2 23 25 25 25 29   GLUCOSE 187* 120* 132* 130* 146*  BUN 21 17 19 21 23   CREATININE 1.73* 1.62* 1.63* 1.68* 1.62*  CALCIUM 8.3* 8.3* 8.2* 8.3* 8.7*  MG  --   --  2.5* 2.6*  --    GFR: Estimated Creatinine Clearance:  50.6 mL/min (A) (by C-G formula based on SCr of 1.62 mg/dL (H)). Liver Function Tests: Recent Labs  Lab 08/10/20 1300 08/11/20 0139  AST 23 28  ALT 21 25  ALKPHOS 64 65  BILITOT 0.8 0.8  PROT 6.9 6.5  ALBUMIN 2.7* 2.6*   No results for input(s): LIPASE, AMYLASE in the last 168 hours. No results for input(s): AMMONIA in the last 168 hours. Coagulation Profile: No results for input(s): INR, PROTIME in the last 168 hours. Cardiac Enzymes: No results for input(s): CKTOTAL, CKMB, CKMBINDEX, TROPONINI in the last 168 hours. BNP (last 3 results) Recent Labs    12/17/19 1327  PROBNP 593.0*   HbA1C: No results for input(s): HGBA1C in the last 72 hours. CBG: Recent Labs  Lab 08/12/20 1109 08/12/20 1546 08/12/20 2132 08/13/20 0604 08/13/20 1119  GLUCAP 193* 154* 143* 157* 176*   Lipid Profile: No results for input(s): CHOL, HDL, LDLCALC, TRIG, CHOLHDL, LDLDIRECT in the last 72 hours. Thyroid Function Tests: No results for input(s): TSH, T4TOTAL, FREET4, T3FREE, THYROIDAB in the last 72 hours. Anemia Panel: No results for input(s): VITAMINB12, FOLATE, FERRITIN, TIBC, IRON, RETICCTPCT in the last 72 hours. Urine analysis:    Component Value Date/Time   COLORURINE STRAW (A) 03/25/2018 1627   APPEARANCEUR CLEAR 03/25/2018 1627   LABSPEC >1.030 (H) 03/25/2018 1627   PHURINE 5.5 03/25/2018 1627   GLUCOSEU 100 (A) 03/25/2018 1627   HGBUR TRACE (A) 03/25/2018 1627   BILIRUBINUR NEGATIVE 03/25/2018 1627   KETONESUR NEGATIVE 03/25/2018 1627   PROTEINUR 100 (A) 03/25/2018 1627   NITRITE NEGATIVE 03/25/2018 1627   LEUKOCYTESUR NEGATIVE 03/25/2018 1627   Sepsis Labs: @LABRCNTIP (procalcitonin:4,lacticidven:4)  ) Recent Results (from the past 240 hour(s))  Resp Panel by RT-PCR (Flu A&B, Covid) Nasopharyngeal Swab     Status: None   Collection Time: 08/09/20  5:09 PM   Specimen: Nasopharyngeal Swab; Nasopharyngeal(NP) swabs in vial transport medium  Result Value Ref Range  Status   SARS Coronavirus 2 by RT PCR NEGATIVE NEGATIVE Final    Comment: (NOTE) SARS-CoV-2 target nucleic acids are NOT DETECTED.  The SARS-CoV-2 RNA is generally detectable in upper respiratory specimens during the acute phase of infection. The lowest concentration of SARS-CoV-2 viral copies this assay can detect is 138 copies/mL. A negative result does not preclude SARS-Cov-2 infection and should not be used as the sole basis for treatment or other patient management decisions. A negative result may occur with  improper specimen collection/handling, submission of specimen  other than nasopharyngeal swab, presence of viral mutation(s) within the areas targeted by this assay, and inadequate number of viral copies(<138 copies/mL). A negative result must be combined with clinical observations, patient history, and epidemiological information. The expected result is Negative.  Fact Sheet for Patients:  BloggerCourse.com  Fact Sheet for Healthcare Providers:  SeriousBroker.it  This test is no t yet approved or cleared by the [place] FDA and  has been authorized for detection and/or diagnosis of SARS-CoV-2 by FDA under an Emergency Use Authorization (EUA). This EUA will remain  in effect (meaning this test can be used) for the duration of the COVID-19 declaration under Section 564(b)(1) of the Act, 21 U.S.C.section 360bbb-3(b)(1), unless the authorization is terminated  or revoked sooner.       Influenza A by PCR NEGATIVE NEGATIVE Final   Influenza B by PCR NEGATIVE NEGATIVE Final    Comment: (NOTE) The Xpert Xpress SARS-CoV-2/FLU/RSV plus assay is intended as an aid in the diagnosis of influenza from Nasopharyngeal swab specimens and should not be used as a sole basis for treatment. Nasal washings and aspirates are unacceptable for Xpert Xpress SARS-CoV-2/FLU/RSV testing.  Fact Sheet for  Patients: BloggerCourse.com  Fact Sheet for Healthcare Providers: SeriousBroker.it  This test is not yet approved or cleared by the [place] FDA and has been authorized for detection and/or diagnosis of SARS-CoV-2 by FDA under an Emergency Use Authorization (EUA). This EUA will remain in effect (meaning this test can be used) for the duration of the COVID-19 declaration under Section 564(b)(1) of the Act, 21 U.S.C. section 360bbb-3(b)(1), unless the authorization is terminated or revoked.  Performed at [facility], [address] [place], [place] [address]   Culture, blood (routine x 2)     Status: None (Preliminary result)   Collection Time: 08/09/20  8:15 PM   Specimen: BLOOD  Result Value Ref Range Status   Specimen Description BLOOD LEFT ANTECUBITAL  Final   Special Requests   Final    BOTTLES DRAWN AEROBIC AND ANAEROBIC Blood Culture adequate volume   Culture   Final    NO GROWTH 3 DAYS Performed at [facility], [address] [place], [place] [address]    Report Status PENDING  Incomplete  Culture, blood (routine x 2)     Status: None (Preliminary result)   Collection Time: 08/09/20  8:20 PM   Specimen: BLOOD  Result Value Ref Range Status   Specimen Description BLOOD RIGHT ANTECUBITAL  Final   Special Requests   Final    BOTTLES DRAWN AEROBIC AND ANAEROBIC Blood Culture adequate volume   Culture   Final    NO GROWTH 3 DAYS Performed at [facility], [address], [place], [place] [address]    Report Status PENDING  Incomplete  MRSA PCR Screening     Status: None   Collection Time: 08/11/20  1:08 AM   Specimen: Nasal Mucosa; Nasopharyngeal  Result Value Ref Range Status   MRSA by PCR NEGATIVE NEGATIVE Final    Comment:        The GeneXpert MRSA Assay (FDA approved for NASAL specimens only), is one component of a comprehensive MRSA colonization surveillance program. It is  not intended to diagnose MRSA infection nor to guide or monitor treatment for MRSA infections. Performed at [facility], [address], [place], [place] [address]       Radiology Studies: DG Chest 2 View  Result Date: 08/12/2020 CLINICAL DATA:  Pneumonia. EXAM: CHEST - 2 VIEW COMPARISON:  August 09, 2020. FINDINGS: Stable cardiomegaly. No pneumothorax is noted. Stable bilateral perihilar and basilar opacities are noted concerning for pneumonia with possible small pleural effusions. Bony thorax is unremarkable. IMPRESSION: Stable bilateral lung opacities are noted concerning for pneumonia and small pleural effusions. Stable cardiomegaly. Electronically Signed   By: Lupita Raider M.D.   On: 08/12/2020 11:08     Scheduled Meds: . amiodarone  200 mg Oral BID  . apixaban  5 mg Oral BID  . atorvastatin  80 mg Oral QHS  . carvedilol  6.25 mg Oral BID WC  . cholecalciferol  1,000 Units Oral Daily  . clopidogrel  75 mg Oral Daily  . empagliflozin  10 mg Oral Daily  . ezetimibe  10 mg Oral QHS  . furosemide  80 mg Intravenous BID  . icosapent Ethyl  2 g Oral BID  . insulin aspart  0-5 Units Subcutaneous QHS  . insulin aspart  0-9 Units Subcutaneous TID WC  . mouth rinse  15 mL Mouth Rinse BID  . mometasone-formoterol  2 puff Inhalation BID  . ranolazine  500 mg Oral BID  . sacubitril-valsartan  1 tablet Oral BID   Continuous Infusions: . azithromycin 500 mg (08/13/20 1123)  . cefTRIAXone (ROCEPHIN)  IV 1 g (08/13/20 0947)     LOS: 4 days   Time Spent in minutes   45 minutes  Nalleli Largent D.O. on 08/13/2020 at 1:38 PM  Between 7am to 7pm - Please see pager noted on [email or web address]  After 7pm go to [email or web address]  And look for the night coverage person covering for me after hours  [facility] Office  [phone]

## 2020-08-14 DIAGNOSIS — J8489 Other specified interstitial pulmonary diseases: Secondary | ICD-10-CM

## 2020-08-14 DIAGNOSIS — I5043 Acute on chronic combined systolic (congestive) and diastolic (congestive) heart failure: Secondary | ICD-10-CM | POA: Diagnosis not present

## 2020-08-14 DIAGNOSIS — R55 Syncope and collapse: Secondary | ICD-10-CM | POA: Diagnosis not present

## 2020-08-14 DIAGNOSIS — I480 Paroxysmal atrial fibrillation: Secondary | ICD-10-CM | POA: Diagnosis not present

## 2020-08-14 DIAGNOSIS — J9601 Acute respiratory failure with hypoxia: Secondary | ICD-10-CM | POA: Diagnosis not present

## 2020-08-14 LAB — CBC
HCT: 39.2 % (ref 39.0–52.0)
Hemoglobin: 12.9 g/dL — ABNORMAL LOW (ref 13.0–17.0)
MCH: 31.1 pg (ref 26.0–34.0)
MCHC: 32.9 g/dL (ref 30.0–36.0)
MCV: 94.5 fL (ref 80.0–100.0)
Platelets: 541 10*3/uL — ABNORMAL HIGH (ref 150–400)
RBC: 4.15 MIL/uL — ABNORMAL LOW (ref 4.22–5.81)
RDW: 12.4 % (ref 11.5–15.5)
WBC: 7.8 10*3/uL (ref 4.0–10.5)
nRBC: 0 % (ref 0.0–0.2)

## 2020-08-14 LAB — GLUCOSE, CAPILLARY
Glucose-Capillary: 194 mg/dL — ABNORMAL HIGH (ref 70–99)
Glucose-Capillary: 227 mg/dL — ABNORMAL HIGH (ref 70–99)
Glucose-Capillary: 260 mg/dL — ABNORMAL HIGH (ref 70–99)
Glucose-Capillary: 280 mg/dL — ABNORMAL HIGH (ref 70–99)

## 2020-08-14 LAB — CULTURE, BLOOD (ROUTINE X 2)
Culture: NO GROWTH
Culture: NO GROWTH
Special Requests: ADEQUATE
Special Requests: ADEQUATE

## 2020-08-14 LAB — MPO/PR-3 (ANCA) ANTIBODIES
ANCA Proteinase 3: <3.5 U/mL (ref 0.0–3.5)
Myeloperoxidase Abs: <9 U/mL (ref 0.0–9.0)

## 2020-08-14 LAB — BASIC METABOLIC PANEL
Anion gap: 11 (ref 5–15)
BUN: 32 mg/dL — ABNORMAL HIGH (ref 8–23)
CO2: 28 mmol/L (ref 22–32)
Calcium: 8.9 mg/dL (ref 8.9–10.3)
Chloride: 98 mmol/L (ref 98–111)
Creatinine, Ser: 1.62 mg/dL — ABNORMAL HIGH (ref 0.61–1.24)
GFR, Estimated: 43 mL/min — ABNORMAL LOW (ref >60)
Glucose, Bld: 202 mg/dL — ABNORMAL HIGH (ref 70–99)
Potassium: 4 mmol/L (ref 3.5–5.1)
Sodium: 137 mmol/L (ref 135–145)

## 2020-08-14 LAB — RHEUMATOID FACTOR: Rheumatoid fact SerPl-aCnc: 15.9 IU/mL — ABNORMAL HIGH (ref <14.0)

## 2020-08-14 MED ORDER — PANTOPRAZOLE SODIUM 40 MG PO TBEC
40.0000 mg | DELAYED_RELEASE_TABLET | Freq: Every day | ORAL | Status: DC
Start: 2020-08-14 — End: 2020-08-20
  Administered 2020-08-14 – 2020-08-19 (×6): 40 mg via ORAL
  Filled 2020-08-14 (×6): qty 1

## 2020-08-14 MED ORDER — CALCIUM CARBONATE-VITAMIN D 500-200 MG-UNIT PO TABS
1.0000 | ORAL_TABLET | Freq: Two times a day (BID) | ORAL | Status: DC
  Administered 2020-08-14 – 2020-08-20 (×13): 1 via ORAL
  Filled 2020-08-14 (×13): qty 1

## 2020-08-14 MED ORDER — LORATADINE 10 MG PO TABS
10.0000 mg | ORAL_TABLET | Freq: Every day | ORAL | Status: DC
  Administered 2020-08-14 – 2020-08-20 (×7): 10 mg via ORAL
  Filled 2020-08-14 (×7): qty 1

## 2020-08-14 MED ORDER — SULFAMETHOXAZOLE-TRIMETHOPRIM 800-160 MG PO TABS
1.0000 | ORAL_TABLET | ORAL | Status: DC
  Administered 2020-08-16 – 2020-08-20 (×3): 1 via ORAL
  Filled 2020-08-14 (×3): qty 1

## 2020-08-14 NOTE — Consult Note (Signed)
NAME:  David Huynh, MRN:  [id number], DOB:  June 12, 1940, LOS: 5 ADMISSION DATE:  08/09/2020, CONSULTATION DATE:  4/1 REFERRING MD:  Catha Gosselin, CHIEF COMPLAINT:  Acute hypoxic resp failure    History of Present Illness:  80 year old male w/ marked cardiac hx (see below) admitted 3/28 for cc 2 d h/o inc SOB, palps and LE edema. then when getting up the am of 3/28 had syncopal event. EMS called. Admitted w/ working dx of CAP and treated w. IVF and abx. Cards consulted for SVT w/ HR 200s on 3/29. PCCM was actually consulted that day we gave amio bolus and pt improved. He was later seen by cardiology who felt this was primarily tachycardia related HF and he subsequently underwent DCCV on 3/30 w/ successful conversion to NSR. On 3/31 he still had exertional dyspnea diuretics continued. Noted to desaturate at night placed on BIPAP. As of 4/1 he is -4.2 liters w/ negative BNP, sed rate of 130 and had dense bilateral patchy airspace disease on film from 3/31. This was a little worse than the initial admitting cxr. He has remained on anticoagulation, he has had fairly aggressive diuresis, on 4/1 placed back on HD and rate is better controlled. In spite of these interventions his oxygen requirements have increased from 6l high flow to 14 which was the reason for PCCM re-consultation    Pertinent  Medical History  CAD DES RCA 17) PCI to left cor and PCI to LAD *2019). , HFrEF most recently EF 59% but has had several episodes since 2917 where it dropped in 20s.  Atrial fib/flutter  W/ prior failed DCCV CKD stage 3 (cr 1.6 range) HL DM type 2 AS  MR  Significant Hospital Events: Including procedures, antibiotic start and stop dates in addition to other pertinent events   . 3/28 admitted. Felt CAP got azith and ctx. Fluid bolus.  . 3/29 SVT in 200s. Seen by ccm. Amiodarone bolus given after rate controled BP better. Later seen by cards. Felt not PNA but more likely HR related CM and acute HF got lasix and  rate control . 3/30 underwent successful DCCV to NSR . 3/31 still hypoxic. CXR worse than on admit . 4/1 abx azithro and ceftriaxone reordered. PCT negative. Sed rate 130. Up from 6 l to 14 liters since admit (ins spite of being almost 5 liters neg) so PCCM consulted.    Interim History / Subjective:  Feeling better with steroids and aggressive diuresis.  Objective   Blood pressure (!) 114/103, pulse 73, temperature 98.2 F (36.8 C), temperature source Oral, resp. rate (!) 22, height 6' (1.829 m), weight 122.9 kg, SpO2 90 %.    FiO2 (%):  [75 %-88 %] 75 %   Intake/Output Summary (Last 24 hours) at 08/14/2020 0920 Last data filed at 08/14/2020 0500 Gross per 24 hour  Intake 640 ml  Output 4150 ml  Net -3510 ml   Filed Weights   08/12/20 0500 08/13/20 0258 08/14/20 0320  Weight: 127.5 kg 125.6 kg 122.9 kg    Examination: Constitutional: elderly man in no acute distress  Eyes: EOMI, pupils equal Ears, nose, mouth, and throat: MMM, trachea midline Cardiovascular: RRR, ext warm Respiratory: scattered crackles bases, no accessory muscle use Gastrointestinal: soft, +BS Skin: No rashes, normal turgor Neurologic: moves all 4 ext to command Psychiatric: good insight, aox3   Labs/imaging that I havepersonally reviewed  (right click and "Reselect all SmartList Selections" daily)  CT: lower lobe predominant NSIP pattern infiltrate,  small R effusion  Resolved Hospital Problem list   SVT  Assessment & Plan:  Acute hypoxic respiratory failure  Bilateral pulmonary infiltrates  Drug induced Pneumonitis vs PNA (PNA seems unlikely) Pulmonary edema  Reported h/o asthma Afib w/ RVR/SVT-->now NSR H/o CAD w/ DES to RCA and prior PCI to Circ and LAD Acute heart failure w/ h/o HFrEF (most recent on 3/30 EF 50%) Mild AS Mild MR CKD stage III DM  Pulmonary Problem list  Acute hypoxic respiratory failure in setting of bilateral pulmonary infiltrates. Lower lobe predominant.  Resistant to  diuretics.  Pct neg.  Longstanding exposure to amio and pattern would be c/w amiodarone toxicity.  Diagnosis of exclusion but given O2 requirements reasonable to treat empirically, started on steroids 4/1.  - DC abx - Continue solumedrol - Start bactrim PJP ppx  - Start calcium/vit D to hopefully reduce osteoporosis risk (low grade evidence) - Start PPI qHS - f/u ANCA, RF - Encourage IS, OOB - SLP eval to complete workup for lower lobe predominant NSIP - Wean O2 to maintain sats >90% - Diuretics as tolerated by renal function - Will check on him Monday and outline steroid taper plan (needs to be over course of months)  Best practice (right click and "Reselect all SmartList Selections" daily)  Per primary

## 2020-08-14 NOTE — Evaluation (Signed)
Clinical/Bedside Swallow Evaluation Patient Details  Name: David Huynh MRN: [id number] Date of Birth: [birth year]  Today's Date: 08/14/2020 Time: SLP Start Time (ACUTE ONLY): 1540 SLP Stop Time (ACUTE ONLY): 1558 SLP Time Calculation (min) (ACUTE ONLY): 18 min  Past Medical History:  Past Medical History:  Diagnosis Date  . Allergy    seasonal  . Asthma    mild, intermittent  . Atrial fibrillation with rapid ventricular response (HCC) 03/25/2018  . CAD, multiple vessel    Three-vessel disease involving mid RCA 85% (PCI with DES), OM 2 80% in branch and ostD1 ~90%. Only the mid RCA with PCI target.   . Carotid bruit 08/05/2010   Bruit noted on left   . Chronic combined systolic and diastolic CHF (congestive heart failure) (HCC)   . CKD (chronic kidney disease), stage III (HCC)   . Diabetes mellitus    type 2  . Fracture of multiple ribs 02/12/2015  . Hemothorax on right 01/23/2015  . Hyperlipidemia   . Hypertension   . Hypocalcemia 02/12/2015  . Lactic acidosis 03/25/2018  . Liver function study, abnormal 04/26/2011  . Multiple rib fractures   . Obesity   . Persistent atrial fibrillation [facility])    Past Surgical History:  Past Surgical History:  Procedure Laterality Date  . APPENDECTOMY  1975  . CARDIAC CATHETERIZATION N/A 02/17/2016   Procedure: Left Heart Cath and Coronary Angiography;  Surgeon: Marykay Lex, MD;  Location: [facility] INVASIVE CV LAB;  Service: Cardiovascular: mRCA 85% (PCI), branch of OM2 80%,  ostD1 90%; EF 35-45%  . CARDIAC CATHETERIZATION N/A 02/17/2016   Procedure: Coronary Stent Intervention;  Surgeon: Marykay Lex, MD;  Location: [facility] INVASIVE CV LAB;  Service: Cardiovascular: mRCA PCI : STENT PROMUS PREM MR [id number] DES  . CARDIOVERSION N/A 03/29/2018   Procedure: CARDIOVERSION;  Surgeon: Laurey Morale, MD;  Location: [facility] ENDOSCOPY;  Service: Cardiovascular;  Laterality: N/A;  . CARDIOVERSION N/A 04/02/2018   Procedure: CARDIOVERSION;  Surgeon: Laurey Morale, MD;  Location: [facility] ENDOSCOPY;  Service: Cardiovascular;  Laterality: N/A;  . CARDIOVERSION N/A 04/19/2018   Procedure: CARDIOVERSION;  Surgeon: Laurey Morale, MD;  Location: [facility] ENDOSCOPY;  Service: Cardiovascular;  Laterality: N/A;  . CARDIOVERSION N/A 08/11/2020   Procedure: CARDIOVERSION;  Surgeon: Laurey Morale, MD;  Location: [facility] ENDOSCOPY;  Service: Cardiovascular;  Laterality: N/A;  . CATARACT EXTRACTION  2010   b/l  . CORONARY STENT INTERVENTION N/A 04/03/2018   Procedure: CORONARY STENT INTERVENTION;  Surgeon: Lennette Bihari, MD;  Location: [facility] INVASIVE CV LAB;  Service: Cardiovascular;  Laterality: N/A;  . CORONARY STENT INTERVENTION N/A 03/02/2020   Procedure: CORONARY STENT INTERVENTION;  Surgeon: Corky Crafts, MD;  Location: [facility] INVASIVE CV LAB;  Service: Cardiovascular;  Laterality: N/A;  . INTRAVASCULAR ULTRASOUND/IVUS N/A 03/02/2020   Procedure: Intravascular Ultrasound/IVUS;  Surgeon: Corky Crafts, MD;  Location: [facility] INVASIVE CV LAB;  Service: Cardiovascular;  Laterality: N/A;  . RETINAL LASER PROCEDURE  1985  . RIGHT/LEFT HEART CATH AND CORONARY ANGIOGRAPHY N/A 04/03/2018   Procedure: RIGHT/LEFT HEART CATH AND CORONARY ANGIOGRAPHY;  Surgeon: Laurey Morale, MD;  Location: [facility] INVASIVE CV LAB;  Service: Cardiovascular;  Laterality: N/A;  . RIGHT/LEFT HEART CATH AND CORONARY ANGIOGRAPHY N/A 03/02/2020   Procedure: RIGHT/LEFT HEART CATH AND CORONARY ANGIOGRAPHY;  Surgeon: Laurey Morale, MD;  Location: [facility] INVASIVE CV LAB;  Service: Cardiovascular;  Laterality: N/A;  . TEE WITHOUT CARDIOVERSION N/A 03/29/2018   Procedure: TRANSESOPHAGEAL ECHOCARDIOGRAM (TEE);  Surgeon: Laurey Morale, MD;  Location: [facility] ENDOSCOPY;  Service: Cardiovascular;  Laterality: N/A;  . TEE WITHOUT CARDIOVERSION N/A 04/19/2018   Procedure: TRANSESOPHAGEAL ECHOCARDIOGRAM (TEE);  Surgeon: Laurey Morale, MD;  Location: [facility] ENDOSCOPY;  Service: Cardiovascular;  Laterality: N/A;  .  TEE WITHOUT CARDIOVERSION N/A 03/02/2020   Procedure: TRANSESOPHAGEAL ECHOCARDIOGRAM (TEE);  Surgeon: Laurey Morale, MD;  Location: [facility] ENDOSCOPY;  Service: Cardiovascular;  Laterality: N/A;  . TONSILLECTOMY AND ADENOIDECTOMY  1947  . TRANSTHORACIC ECHOCARDIOGRAM  02/2016   Mild LVH. Moderately reduced EF of 35-40% with diffuse hypokinesis. Indeterminate diastolic function. Likely elevated LA pressures. Mild aortic stenosis (mean gradient 10 mmHg). Mild MR. Mild LA dilation. Mild to moderate PA hypertension with 39 mmHg  . VIDEO ASSISTED THORACOSCOPY (VATS)/THOROCOTOMY Right 01/24/2015   Procedure: VIDEO ASSISTED THORACOSCOPY (VATS) FOR DRAINAGE OF RIGHT HEMOTHORAX;  Surgeon: Alleen Borne, MD;  Location: [facility] OR;  Service: Thoracic;  Laterality: Right;   HPI:  David Huynh is a 80 y.o. male with medical history significant for CAD, chronic systolic CHF, PAF, HTN, HLD, DM, moderate AS, CKD 3, mild persistent asthma who presents with concerns of increasing shortness of breath and syncope.  About 2 days ago he began to note worsening shortness of breath with exertion and then progressively became short of breath even with just standing.  Today he got up out of bed and was getting ready to follow up with cardiology when he felt dizzy with heart palpitations and fell back onto his bed with loss of consciousness.  Wife called EMS afterwards Chest CT on 08/13/20 indicated Patchy and confluent areas of consolidation throughout both lungs but most prominent in the bases. Appearance suggests pneumonia. Amiodarone toxicity would be a possibility as well. Clinical correlation is suggested  Assessment / Plan / Recommendation Clinical Impression  Pt seen for clinical swallowing evaluation with normal oropharyngeal swallow noted throughout evaluation.  Pt c/o difficulty with swallowing on 08/13/20 d/t decreased breath support and difficulty speaking d/t dyspnea, but this has resolved d/t steroids given this date.   Pt without overt s/s of aspiration with all consistencies including thin via cup/straw, puree and solids; discussed breathing/swallowing reciprocity with pt and how this affects overall swallowing ability.  Pt in agreement and stated he would use strategies discussed prn including small bites/sips, slow rate and upright positioning.  Post-swallow exhalation noted throughout evaluation.  Recommend continue current diet consistency of regular/thin liquids (heart healthy/carb modified) with swallowing strategies prn during intake.  ST will s/o at this time.  Thank you for this consult.   SLP Visit Diagnosis: Dysphagia, unspecified (R13.10)    Aspiration Risk  Mild aspiration risk    Diet Recommendation   Regular/thin liquids (heart healthy/carb modified)  Medication Administration: Whole meds with liquid    Other  Recommendations Oral Care Recommendations: Oral care BID   Follow up Recommendations None      Frequency and Duration  (Evaluation only)          Prognosis Prognosis for Safe Diet Advancement: Good      Swallow Study   General Date of Onset: 08/09/20 HPI: David Huynh is a 80 y.o. male with medical history significant for CAD, chronic systolic CHF, PAF, HTN, HLD, DM, moderate AS, CKD 3, mild persistent asthma who presents with concerns of increasing shortness of breath and syncope.  About 2 days ago he began to note worsening shortness of breath with exertion and then progressively became short of breath even with just  standing.  Today he got up out of bed and was getting ready to follow up with cardiology when he felt dizzy with heart palpitations and fell back onto his bed with loss of consciousness.  Wife called EMS afterwards Type of Study: Bedside Swallow Evaluation Previous Swallow Assessment: n/a Diet Prior to this Study: Regular;Thin liquids;Other (Comment) (heart healthy/carb modified) Temperature Spikes Noted: No Respiratory Status: Nasal cannula (HFNC) History  of Recent Intubation: No Behavior/Cognition: Alert;Cooperative;Pleasant mood Oral Cavity Assessment: Within Functional Limits Oral Care Completed by SLP: No Oral Cavity - Dentition: Adequate natural dentition Vision: Functional for self-feeding Self-Feeding Abilities: Able to feed self Patient Positioning: Upright in bed Baseline Vocal Quality: Normal Volitional Cough: Strong Volitional Swallow: Able to elicit    Oral/Motor/Sensory Function Overall Oral Motor/Sensory Function: Within functional limits   Ice Chips Ice chips: Within functional limits Presentation: Spoon   Thin Liquid Thin Liquid: Within functional limits Presentation: Cup;Self Fed;Straw    Nectar Thick Nectar Thick Liquid: Not tested   Honey Thick Honey Thick Liquid: Not tested   Puree Puree: Within functional limits Presentation: Self Fed   Solid     Solid: Within functional limits Presentation: Self Fed      Tressie Stalker, M.S., CCC-SLP 08/14/2020,4:13 PM

## 2020-08-14 NOTE — Progress Notes (Signed)
PROGRESS NOTE    David Huynh  HBZ:[id number] DOB: May 20, 1940 DOA: 08/09/2020 PCP: Bradd Canary, MD   Brief Narrative:  HPI on 08/09/2020 by Dr. Benita Gutter David Huynh is a 80 y.o. male with medical history significant for CAD, chronic systolic CHF,PAF,HTN, HLD, DM, moderate AS, CKD 3, mild persistent asthma who presents with concerns of increasing shortness of breath and syncope. About 2 days ago he began to note worsening shortness of breath with exertion and then progressively became short of breath even with just standing.  Today he got up out of bed and was getting ready to follow up with cardiology when he felt dizzy with heart palpitations and fell back onto his bed with loss of consciousness.  Wife called EMS afterwards.  Noted to be in atrial fibrillation/atrial flutter with rates of 100-120 by EMS.  CBG of 340.  Also noted to have some orthostatic changes from laying to sitting going from systolic of 140-1 08.  He has been having some increasing lower extremity and abdominal edema for past several weeks.He was last seen by cardiology on 07/22/20 and has been progressively getting increase in Torsemide since January due to increase in wight. Also had increase in his amiodarone since he was noted to be in atrial flutter during that visit. He is followed by endocrinology for his diabetes and previously had his Metformin reduced due to worsening GFR of less than 45.  Interim history Patient presented with worsening shortness of breath and syncope.  Found to have atrial fibrillation/flutter along with orthostatic changes.  Cardiology was consulted and appreciated, currently being treated for CHF exacerbation along with A. fib/a flutter.  Status post cardioversion and in sinus rhythm. Currently requiring HFNC.  Hospitalization complicated by respiratory failure.  Pulmonology consulted.  CT obtained showing likely  amiodarone toxicity, patient started on steroids. Assessment & Plan    Paroxysmal atrial fibrillation/flutter with RVR -Patient noted to have heart rates of up to 220s with no response to vagal maneuvers.  He was treated with adenosine x2 and was noted to have a drop in blood pressure. -CCM was consulted and patient was placed on IV amiodarone bolus and continued drip. -Cardiology consulted and appreciated -Status post cardioversion 08/11/20- remains in sinus rhythm today -Patient will need follow-up with electrophysiology -Continue Eliquis -Given respiratory failure, and possibility of amiodarone lung toxicity, amiodarone discontinued  Acute combined systolic and diastolic heart failure/acute respiratory failure with hypoxia -Patient was noted to have increased shortness of breath and was hypoxic in the 80s and required supplemental oxygen.  Does not use oxygen at home. -Prior to admission, torsemide was increased to 60 mg twice daily by cardiology. -Echocardiogram shows an EF of 50 to 55%, LV demonstrates regional wall motion abnormalities.  LV diastolic parameters indeterminate. -Chest x-ray on admission showed bilateral pleural effusions -Continue monitor intake and output, daily weights -Was placed on placed on IV Lasix 80 mg twice daily with metolazone however currently held today.  Cardiology will possibly transition to oral diuretics on 08/15/2020 -Continue Entresto (Cardiology increasing dose), Coreg, Jardiance -Continue supplemental oxygen, and wean as possible- currently on HFNC 12-14L -Overnight, BIPAP was attempted, however patient could not tolerate it -Discussed sleep study with the patient, he was adamant about not wanting to have one done and completely refuses -Pulmonology consulted and appreciated  Patchy consolidation, likely amiodarone toxicity -CXR on 08/12/2020 concerning for pneumonia however procalcitonin unremarkable 0.14 -Currently afebrile -patient with leukocytosis however this may be reactive -Patient was started on  azithromycin  and ceftriaxone however given that patient is afebrile and procalcitonin has been unremarkable, antibiotics were discontinued by pulmonology -CT chest showed patchy and confluent areas of consolidation throughout both lungs most prominent in the bases appearance suggest pneumonia.  Amiodarone toxicity would be a possibility as well. -Patient was started on IV Solu-Medrol  Syncope with loss of consciousness -Likely secondary to the above -Echocardiogram is unable to visualize mitral valve.  Moderate tricuspid valve regurgitation.  Mild aortic valve stenosis.  Aortic dilatation measuring 40 mm.  Leukocytosis/thrombocytosis -Leukocytosis is resolved today  -suspect reactive, no signs or source of infection at this time  Chronic kidney disease, stage IIIb -Creatinine appears to be stable, currently 1.62  -continue to monitor BMP  Anemia of chronic disease -Hemoglobin appears to be stable, continue monitor CBC  History of CAD -With drug-eluting stent -Currently no chest -Continue Coreg, Plavix, Eliquis, Ranexa  Diabetes mellitus, type II -Hemoglobin A1c was 6.5 approximately 2 months ago -Continue insulin sliding scale CBG monitoring   History of mild persistent asthma -Continue bronchodilators as needed and incentive spirometer -Currently no wheezing on exam  DVT Prophylaxis Eliquis  Code Status: Full  Family Communication: None at bedside  Disposition Plan:  Status is: Inpatient  Remains inpatient appropriate because:IV treatments appropriate due to intensity of illness or inability to take PO   Dispo: The patient is from: Home              Anticipated d/c is to: Home              Patient currently is not medically stable to d/c.   Difficult to place patient No   Consultants PCCM Cardiology  Procedures  Echocardiogram Cardioversion  Antibiotics   Anti-infectives (From admission, onward)   Start     Dose/Rate Route Frequency Ordered Stop   08/16/20 0900   sulfamethoxazole-trimethoprim (BACTRIM DS) 800-160 MG per tablet 1 tablet        1 tablet Oral Once per day on Mon Wed Fri 08/14/20 0923     08/13/20 0800  azithromycin (ZITHROMAX) 500 mg in sodium chloride 0.9 % 250 mL IVPB  Status:  Discontinued        500 mg 250 mL/hr over 60 Minutes Intravenous Every 24 hours 08/13/20 0720 08/14/20 0922   08/13/20 0800  cefTRIAXone (ROCEPHIN) 1 g in sodium chloride 0.9 % 100 mL IVPB  Status:  Discontinued        1 g 200 mL/hr over 30 Minutes Intravenous Every 24 hours 08/13/20 0720 08/14/20 0922   08/09/20 2015  cefTRIAXone (ROCEPHIN) 1 g in sodium chloride 0.9 % 100 mL IVPB        1 g 200 mL/hr over 30 Minutes Intravenous  Once 08/09/20 2004 08/09/20 2112   08/09/20 2015  azithromycin (ZITHROMAX) 500 mg in sodium chloride 0.9 % 250 mL IVPB        500 mg 250 mL/hr over 60 Minutes Intravenous  Once 08/09/20 2004 08/09/20 2135      Subjective:   [name] seen and examined today.   Patient thankful that his respiratory failure has now had more investigation.  Continues to have some shortness of breath but feels it is mildly improved.  Denies current chest pain, abdominal pain, nausea or vomiting, diarrhea constipation, dizziness or headache.  Objective:   Vitals:   08/14/20 0320 08/14/20 0745 08/14/20 0800 08/14/20 1200  BP: 134/73  (!) 114/103 (!) 110/59  Pulse: 75  73 74  Resp: (!) 22  (!)  22 (!) 23  Temp: 97.9 F (36.6 C)  98.2 F (36.8 C) 98.3 F (36.8 C)  TempSrc: Oral  Oral Oral  SpO2: (!) 89% 90% 90% 91%  Weight: 122.9 kg     Height:        Intake/Output Summary (Last 24 hours) at 08/14/2020 1404 Last data filed at 08/14/2020 1200 Gross per 24 hour  Intake 1000 ml  Output 2400 ml  Net -1400 ml   Filed Weights   08/12/20 0500 08/13/20 0258 08/14/20 0320  Weight: 127.5 kg 125.6 kg 122.9 kg   Exam  General: Well developed, chronically ill-appearing, NAD  HEENT: NCAT, mucous membranes moist.   Cardiovascular: S1 S2  auscultated, RRR, 2/6 SEM  Respiratory: Diminished breath sounds, basilar crackles  Abdomen: Soft, obese, distended, nontender,+ bowel sounds  Extremities: warm dry without cyanosis clubbing.  Lower extremity edema improving  Neuro: AAOx3, nonfocal  Psych: Appropriate mood and affect, pleasant  Data Reviewed: I have personally reviewed following labs and imaging studies  CBC: Recent Labs  Lab 08/09/20 1643 08/10/20 0541 08/11/20 0139 08/12/20 0102 08/14/20 0057  WBC 10.7* 13.3* 12.6* 13.4* 7.8  NEUTROABS 8.6*  --  9.4*  --   --   HGB 11.5* 11.8* 11.6* 11.7* 12.9*  HCT 35.0* 35.4* 35.2* 36.1* 39.2  MCV 95.6 95.7 95.4 97.6 94.5  PLT 394 458* 477* 496* 541*   Basic Metabolic Panel: Recent Labs  Lab 08/10/20 0541 08/11/20 0139 08/12/20 0102 08/13/20 0022 08/14/20 0057  NA 141 140 140 139 137  K 3.8 3.4* 3.8 3.7 4.0  CL 105 105 106 100 98  CO2 25 25 25 29 28   GLUCOSE 120* 132* 130* 146* 202*  BUN 17 19 21 23  32*  CREATININE 1.62* 1.63* 1.68* 1.62* 1.62*  CALCIUM 8.3* 8.2* 8.3* 8.7* 8.9  MG  --  2.5* 2.6*  --   --    GFR: Estimated Creatinine Clearance: 50 mL/min (A) (by C-G formula based on SCr of 1.62 mg/dL (H)). Liver Function Tests: Recent Labs  Lab 08/10/20 1300 08/11/20 0139  AST 23 28  ALT 21 25  ALKPHOS 64 65  BILITOT 0.8 0.8  PROT 6.9 6.5  ALBUMIN 2.7* 2.6*   No results for input(s): LIPASE, AMYLASE in the last 168 hours. No results for input(s): AMMONIA in the last 168 hours. Coagulation Profile: No results for input(s): INR, PROTIME in the last 168 hours. Cardiac Enzymes: No results for input(s): CKTOTAL, CKMB, CKMBINDEX, TROPONINI in the last 168 hours. BNP (last 3 results) Recent Labs    12/17/19 1327  PROBNP 593.0*   HbA1C: No results for input(s): HGBA1C in the last 72 hours. CBG: Recent Labs  Lab 08/13/20 1119 08/13/20 1615 08/13/20 2122 08/14/20 0602 08/14/20 1121  GLUCAP 176* 151* 208* 194* 227*   Lipid Profile: No  results for input(s): CHOL, HDL, LDLCALC, TRIG, CHOLHDL, LDLDIRECT in the last 72 hours. Thyroid Function Tests: No results for input(s): TSH, T4TOTAL, FREET4, T3FREE, THYROIDAB in the last 72 hours. Anemia Panel: No results for input(s): VITAMINB12, FOLATE, FERRITIN, TIBC, IRON, RETICCTPCT in the last 72 hours. Urine analysis:    Component Value Date/Time   COLORURINE STRAW (A) 03/25/2018 1627   APPEARANCEUR CLEAR 03/25/2018 1627   LABSPEC >1.030 (H) 03/25/2018 1627   PHURINE 5.5 03/25/2018 1627   GLUCOSEU 100 (A) 03/25/2018 1627   HGBUR TRACE (A) 03/25/2018 1627   BILIRUBINUR NEGATIVE 03/25/2018 1627   KETONESUR NEGATIVE 03/25/2018 1627   PROTEINUR 100 (A) 03/25/2018 1627  NITRITE NEGATIVE 03/25/2018 1627   LEUKOCYTESUR NEGATIVE 03/25/2018 1627   Sepsis Labs: @LABRCNTIP (procalcitonin:4,lacticidven:4)  ) Recent Results (from the past 240 hour(s))  Resp Panel by RT-PCR (Flu A&B, Covid) Nasopharyngeal Swab     Status: None   Collection Time: 08/09/20  5:09 PM   Specimen: Nasopharyngeal Swab; Nasopharyngeal(NP) swabs in vial transport medium  Result Value Ref Range Status   SARS Coronavirus 2 by RT PCR NEGATIVE NEGATIVE Final    Comment: (NOTE) SARS-CoV-2 target nucleic acids are NOT DETECTED.  The SARS-CoV-2 RNA is generally detectable in upper respiratory specimens during the acute phase of infection. The lowest concentration of SARS-CoV-2 viral copies this assay can detect is 138 copies/mL. A negative result does not preclude SARS-Cov-2 infection and should not be used as the sole basis for treatment or other patient management decisions. A negative result may occur with  improper specimen collection/handling, submission of specimen other than nasopharyngeal swab, presence of viral mutation(s) within the areas targeted by this assay, and inadequate number of viral copies(<138 copies/mL). A negative result must be combined with clinical observations, patient history, and  epidemiological information. The expected result is Negative.  Fact Sheet for Patients:  BloggerCourse.comhttps://www.fda.gov/media/152166/download  Fact Sheet for Healthcare Providers:  SeriousBroker.ithttps://www.fda.gov/media/152162/download  This test is no t yet approved or cleared by the [place] FDA and  has been authorized for detection and/or diagnosis of SARS-CoV-2 by FDA under an Emergency Use Authorization (EUA). This EUA will remain  in effect (meaning this test can be used) for the duration of the COVID-19 declaration under Section 564(b)(1) of the Act, 21 U.S.C.section 360bbb-3(b)(1), unless the authorization is terminated  or revoked sooner.       Influenza A by PCR NEGATIVE NEGATIVE Final   Influenza B by PCR NEGATIVE NEGATIVE Final    Comment: (NOTE) The Xpert Xpress SARS-CoV-2/FLU/RSV plus assay is intended as an aid in the diagnosis of influenza from Nasopharyngeal swab specimens and should not be used as a sole basis for treatment. Nasal washings and aspirates are unacceptable for Xpert Xpress SARS-CoV-2/FLU/RSV testing.  Fact Sheet for Patients: BloggerCourse.comhttps://www.fda.gov/media/152166/download  Fact Sheet for Healthcare Providers: SeriousBroker.ithttps://www.fda.gov/media/152162/download  This test is not yet approved or cleared by the [place] FDA and has been authorized for detection and/or diagnosis of SARS-CoV-2 by FDA under an Emergency Use Authorization (EUA). This EUA will remain in effect (meaning this test can be used) for the duration of the COVID-19 declaration under Section 564(b)(1) of the Act, 21 U.S.C. section 360bbb-3(b)(1), unless the authorization is terminated or revoked.  Performed at [facility], [address] [place], [place] [address]   Culture, blood (routine x 2)     Status: None   Collection Time: 08/09/20  8:15 PM   Specimen: BLOOD  Result Value Ref Range Status   Specimen Description BLOOD LEFT ANTECUBITAL  Final   Special Requests   Final    BOTTLES  DRAWN AEROBIC AND ANAEROBIC Blood Culture adequate volume   Culture   Final    NO GROWTH 5 DAYS Performed at [facility], [address] [place], [place] [address]    Report Status 08/14/2020 FINAL  Final  Culture, blood (routine x 2)     Status: None   Collection Time: 08/09/20  8:20 PM   Specimen: BLOOD  Result Value Ref Range Status   Specimen Description BLOOD RIGHT ANTECUBITAL  Final   Special Requests   Final    BOTTLES DRAWN AEROBIC AND ANAEROBIC Blood Culture  adequate volume   Culture   Final    NO GROWTH 5 DAYS Performed at [facility], [address], [place], [place] [address]    Report Status 08/14/2020 FINAL  Final  MRSA PCR Screening     Status: None   Collection Time: 08/11/20  1:08 AM   Specimen: Nasal Mucosa; Nasopharyngeal  Result Value Ref Range Status   MRSA by PCR NEGATIVE NEGATIVE Final    Comment:        The GeneXpert MRSA Assay (FDA approved for NASAL specimens only), is one component of a comprehensive MRSA colonization surveillance program. It is not intended to diagnose MRSA infection nor to guide or monitor treatment for MRSA infections. Performed at [facility], [address], [place], [place] [address]       Radiology Studies: CT CHEST WO CONTRAST  Result Date: 08/13/2020 CLINICAL DATA:  Respiratory failure.  Possible amiodarone toxicity. EXAM: CT CHEST WITHOUT CONTRAST TECHNIQUE: Multidetector CT imaging of the chest was performed following the standard protocol without IV contrast. COMPARISON:  02/11/2016 FINDINGS: Cardiovascular: Cardiac enlargement. Coronary artery and aortic calcification. Probable coronary stents. No aortic aneurysm. Mediastinum/Nodes: Mild prominence of mediastinal lymph nodes with pretracheal nodes measuring up to about 1.4 cm short axis dimension. Similar appearance to previous study, likely reactive. Esophagus is decompressed. Lungs/Pleura: Small right pleural effusion. Patchy and confluent areas  of consolidation throughout both lungs but most prominent in the bases. Appearance suggests pneumonia. Amiodarone toxicity would be a possibility as well. Edema less likely. Clinical correlation is suggested. Upper Abdomen: No acute abnormality. Musculoskeletal: Degenerative changes in the spine with bridging anterior osteophytes. IMPRESSION: 1. Cardiac enlargement. 2. Small right pleural effusion 3. Patchy and confluent areas of consolidation throughout both lungs but most prominent in the bases. Appearance suggests pneumonia. Amiodarone toxicity would be a possibility as well. Clinical correlation is suggested. 4. Mild prominence of mediastinal lymph nodes, likely reactive. 5. Aortic atherosclerosis. Aortic Atherosclerosis (ICD10-I70.0). Electronically Signed   By: Burman Nieves M.D.   On: 08/13/2020 21:02     Scheduled Meds: . apixaban  5 mg Oral BID  . atorvastatin  80 mg Oral QHS  . calcium-vitamin D  1 tablet Oral BID  . carvedilol  6.25 mg Oral BID WC  . clopidogrel  75 mg Oral Daily  . empagliflozin  10 mg Oral Daily  . ezetimibe  10 mg Oral QHS  . icosapent Ethyl  2 g Oral BID  . insulin aspart  0-5 Units Subcutaneous QHS  . insulin aspart  0-9 Units Subcutaneous TID WC  . loratadine  10 mg Oral Daily  . mouth rinse  15 mL Mouth Rinse BID  . methylPREDNISolone (SOLU-MEDROL) injection  40 mg Intravenous Q12H  . mometasone-formoterol  2 puff Inhalation BID  . pantoprazole  40 mg Oral QHS  . ranolazine  500 mg Oral BID  . sacubitril-valsartan  1 tablet Oral BID  . [START ON 08/16/2020] sulfamethoxazole-trimethoprim  1 tablet Oral Once per day on Mon Wed Fri   Continuous Infusions:    LOS: 5 days   Time Spent in minutes   45 minutes  Ridhima Golberg D.O. on 08/14/2020 at 2:04 PM  Between 7am to 7pm - Please see pager noted on [email or web address]  After 7pm go to [email or web address]  And look for the night coverage person covering for me after hours  [facility] Office   [phone]

## 2020-08-14 NOTE — Progress Notes (Signed)
Patient ID: TAJAE RYBICKI, male   DOB: [birth year], 80 y.o.   MRN: [id number]     Advanced Heart Failure Rounding Note  PCP-Cardiologist: Loralie Champagne, MD   Subjective:    DCCV to NSR on 3/30.  Echo showed EF still in 50-55% range (stable) with normal RV, moderate biatrial enlargement, mild MR, moderate TR, mild AS.   CT chest with patchy consolidation consistent with PNA versus amiodarone toxicity.   He remains in NSR today.  Good diuresis yesterday.  Down 6 lbs.    Objective:   Weight Range: 122.9 kg Body mass index is 36.75 kg/m.   Vital Signs:   Temp:  [97.8 F (36.6 C)-98.8 F (37.1 C)] 98.2 F (36.8 C) (04/02 0800) Pulse Rate:  [65-88] 73 (04/02 0800) Resp:  [18-30] 22 (04/02 0800) BP: (114-137)/(65-103) 114/103 (04/02 0800) SpO2:  [88 %-96 %] 90 % (04/02 0800) FiO2 (%):  [75 %-88 %] 75 % (04/01 1503) Weight:  [122.9 kg] 122.9 kg (04/02 0320) Last BM Date: 08/09/20  Weight change: Filed Weights   08/12/20 0500 08/13/20 0258 08/14/20 0320  Weight: 127.5 kg 125.6 kg 122.9 kg    Intake/Output:   Intake/Output Summary (Last 24 hours) at 08/14/2020 0909 Last data filed at 08/14/2020 0500 Gross per 24 hour  Intake 640 ml  Output 4150 ml  Net -3510 ml      Physical Exam   General: NAD Neck: No JVD, no thyromegaly or thyroid nodule.  Lungs: Crackles at bases.  CV: Nondisplaced PMI.  Heart regular S1/S2, no S3/S4, 2/6 SEM RUSB.  No peripheral edema.  Abdomen: Soft, nontender, no hepatosplenomegaly, no distention.  Skin: Intact without lesions or rashes.  Neurologic: Alert and oriented x 3.  Psych: Normal affect. Extremities: No clubbing or cyanosis.  HEENT: Normal.    Telemetry   NSR 70s (personally reviewed)   Labs    CBC Recent Labs    08/12/20 0102 08/14/20 0057  WBC 13.4* 7.8  HGB 11.7* 12.9*  HCT 36.1* 39.2  MCV 97.6 94.5  PLT 496* 921*   Basic Metabolic Panel Recent Labs    08/12/20 0102 08/13/20 0022 08/14/20 0057  NA 140 139  137  K 3.8 3.7 4.0  CL 106 100 98  CO2 $Re'25 29 28  'VjZ$ GLUCOSE 130* 146* 202*  BUN 21 23 32*  CREATININE 1.68* 1.62* 1.62*  CALCIUM 8.3* 8.7* 8.9  MG 2.6*  --   --    Liver Function Tests No results for input(s): AST, ALT, ALKPHOS, BILITOT, PROT, ALBUMIN in the last 72 hours. No results for input(s): LIPASE, AMYLASE in the last 72 hours. Cardiac Enzymes No results for input(s): CKTOTAL, CKMB, CKMBINDEX, TROPONINI in the last 72 hours.  BNP: BNP (last 3 results) Recent Labs    12/06/19 1551 08/09/20 1643  BNP 792.4* 339.4*    ProBNP (last 3 results) Recent Labs    12/17/19 1327  PROBNP 593.0*     D-Dimer No results for input(s): DDIMER in the last 72 hours. Hemoglobin A1C No results for input(s): HGBA1C in the last 72 hours. Fasting Lipid Panel No results for input(s): CHOL, HDL, LDLCALC, TRIG, CHOLHDL, LDLDIRECT in the last 72 hours. Thyroid Function Tests No results for input(s): TSH, T4TOTAL, T3FREE, THYROIDAB in the last 72 hours.  Invalid input(s): FREET3  Other results:   Imaging    CT CHEST WO CONTRAST  Result Date: 08/13/2020 CLINICAL DATA:  Respiratory failure.  Possible amiodarone toxicity. EXAM: CT CHEST WITHOUT CONTRAST TECHNIQUE: Multidetector  CT imaging of the chest was performed following the standard protocol without IV contrast. COMPARISON:  02/11/2016 FINDINGS: Cardiovascular: Cardiac enlargement. Coronary artery and aortic calcification. Probable coronary stents. No aortic aneurysm. Mediastinum/Nodes: Mild prominence of mediastinal lymph nodes with pretracheal nodes measuring up to about 1.4 cm short axis dimension. Similar appearance to previous study, likely reactive. Esophagus is decompressed. Lungs/Pleura: Small right pleural effusion. Patchy and confluent areas of consolidation throughout both lungs but most prominent in the bases. Appearance suggests pneumonia. Amiodarone toxicity would be a possibility as well. Edema less likely. Clinical  correlation is suggested. Upper Abdomen: No acute abnormality. Musculoskeletal: Degenerative changes in the spine with bridging anterior osteophytes. IMPRESSION: 1. Cardiac enlargement. 2. Small right pleural effusion 3. Patchy and confluent areas of consolidation throughout both lungs but most prominent in the bases. Appearance suggests pneumonia. Amiodarone toxicity would be a possibility as well. Clinical correlation is suggested. 4. Mild prominence of mediastinal lymph nodes, likely reactive. 5. Aortic atherosclerosis. Aortic Atherosclerosis (ICD10-I70.0). Electronically Signed   By: Lucienne Capers M.D.   On: 08/13/2020 21:02     Medications:     Scheduled Medications: . apixaban  5 mg Oral BID  . atorvastatin  80 mg Oral QHS  . carvedilol  6.25 mg Oral BID WC  . cholecalciferol  1,000 Units Oral Daily  . clopidogrel  75 mg Oral Daily  . empagliflozin  10 mg Oral Daily  . ezetimibe  10 mg Oral QHS  . icosapent Ethyl  2 g Oral BID  . insulin aspart  0-5 Units Subcutaneous QHS  . insulin aspart  0-9 Units Subcutaneous TID WC  . mouth rinse  15 mL Mouth Rinse BID  . methylPREDNISolone (SOLU-MEDROL) injection  40 mg Intravenous Q12H  . mometasone-formoterol  2 puff Inhalation BID  . ranolazine  500 mg Oral BID  . sacubitril-valsartan  1 tablet Oral BID    Infusions: . azithromycin 500 mg (08/13/20 1123)  . cefTRIAXone (ROCEPHIN)  IV 200 mL/hr at 08/14/20 0807    PRN Medications:      Assessment/Plan   1. Acute on chronic systolic CHF: Prior ischemic cardiomyopathy, improved after PCI in 2017. Echo 03/2018 with EF back down to 35-40% and moderate-severe RV dysfunction. This was in the setting of atrial fibrillation with RVR. EF 40% on TEE 04/2018.  Echo was done in 3/20, showing EF up to 50%.  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  Echo in 7/21 with EF 25-30%, severe RV dysfunction.  TEE in 10/21, however, showed EF up to 50%.  He had PCI to  LAD in 10/21.  Echo 08/12/20 with EF stable 50-55%, normal RV.  He is now back in NSR.  He has diuresed well with IV Lasix, now looks euvolemic.  - No Lasix today, can restart po diuretic tomorrow.  - Continue Entresto 49/51 bid.   -Continue Coreg at lower dose for now 6.25 mg bid.  - Continue empagliflozin 10 mg daily.  2. Atrial fibrillation/Atrial flutter: As above, concerned for possible tachy-mediated CMP in past. Had successful DC-CV on 03/29/18 but then back in atrial fibrillation.Ranolazine added, but failed DCCV again 04/02/18. DCCV 04/19/18 was successful and he remained in NSR on amiodarone + ranolazine. He opted against atrial fibrillation ablation in the past (saw Dr. Rayann Heman).  He was noted to be in atrial fibrillation at last clinic visit and plan was made for DCCV if he did not convert, but he was admitted with syncope and  rapid SVT in 200s.  SVT was regular, ?1:1 atrial flutter.  In atrial flutter rate 100s on amiodarone gtt => DCCV to NSR on 08/11/20. NSR today.  - Continue ranolazine 500 mg bid.  - Amiodarone stopped due to concern for amiodarone toxicity.   - Continue apixaban.   - Discussed with EP (Dr. Curt Bears).  He recommends DCCV only this admission, then close followup with Dr. Rayann Heman as patient is going to need atrial fibrillation + atrial flutter ablation.  Will need healing of lung process prior to ablation.  - If he needs anti-arrhythmic in future, Tikosyn would be option but needs amiodarone wash-out.  3. CKD: Stage 3.  Creatinine stable at 1.6, follow.  4. CAD: History of DES to RCA in 9/17. UnderwentLHC11/20/19 with PCI to prox left circ.LHC in 10/21 with PCI to LAD.  No chest pain.  -He is on Eliquis so no ASA.   - Continue Plavix ideally x 1 yr (to 10/22).  - Continue atorvastatin and vascepa.  5. DM2: Per primary.  6. Aortic stenosis: Mild by echo this admission.   7. Mitral regurgitation: Mild by echo this admission.  8. Acute Hypoxic Respiratory Failure: CT  chest with patchy consolidation c/w PNA versus amiodarone toxicity.  No fever, normal PCT and WBCs. ESR 130.  - Course of ceftriaxone/azithromycin, but suspect not bacterial PNA.  - Concern for amiodarone toxicity, amiodarone stopped and Solumedrol IV begun.  Pulmonary following.     Length of Stay: [place], MD  08/14/2020, 9:09 AM  Advanced Heart Failure Team Pager [phone] (M-F; 7a - 5p)  Please contact [facility] for night-coverage after hours (5p -7a ) and weekends on [email or web address]

## 2020-08-14 NOTE — Progress Notes (Incomplete Revision)
Patient ID: David Huynh, male   DOB: [birth year], 80 y.o.   MRN: [id number]     Advanced Heart Failure Rounding Note  PCP-Cardiologist: Loralie Champagne, MD   Subjective:    DCCV to NSR on 3/30.  Echo showed EF still in 50-55% range (stable) with normal RV, moderate biatrial enlargement, mild MR, moderate TR, mild AS.   CT chest with patchy consolidation consistent with PNA versus amiodarone toxicity. ESR 130.  Seen by Pulmonary and started on steroids for possible amio toxicity.    He remains in NSR. Off lasix. Weight up 4 pounds.  Denies SOB, orthopnea or PND but sats drop into high 70s-low 80s with talking.  Objective:   Weight Range: 122.9 kg Body mass index is 36.75 kg/m.   Vital Signs:   Temp:  [97.8 F (36.6 C)-98.8 F (37.1 C)] 98.2 F (36.8 C) (04/02 0800) Pulse Rate:  [65-88] 73 (04/02 0800) Resp:  [18-30] 22 (04/02 0800) BP: (114-137)/(65-103) 114/103 (04/02 0800) SpO2:  [88 %-96 %] 90 % (04/02 0800) FiO2 (%):  [75 %-88 %] 75 % (04/01 1503) Weight:  [122.9 kg] 122.9 kg (04/02 0320) Last BM Date: 08/09/20  Weight change: Filed Weights   08/12/20 0500 08/13/20 0258 08/14/20 0320  Weight: 127.5 kg 125.6 kg 122.9 kg    Intake/Output:   Intake/Output Summary (Last 24 hours) at 08/14/2020 0909 Last data filed at 08/14/2020 0500 Gross per 24 hour  Intake 640 ml  Output 4150 ml  Net -3510 ml      Physical Exam   General:  Sitting up in bed  No resp difficulty HEENT: normal Neck: supple. no JVD. Carotids 2+ bilat; no bruits. No lymphadenopathy or thryomegaly appreciated. Cor: PMI nondisplaced. Regular rate & rhythm. No rubs, gallops or murmurs. Lungs: mild crackles o/w decreased breath sounds throughout Abdomen: soft, nontender, nondistended. No hepatosplenomegaly. No bruits or masses. Good bowel sounds. Extremities: no cyanosis, clubbing, rash, edema Neuro: alert & orientedx3, cranial nerves grossly intact. moves all 4 extremities w/o difficulty. Affect  pleasant    Telemetry   NSR 70s Personally reviewed   Labs    CBC Recent Labs    08/12/20 0102 08/14/20 0057  WBC 13.4* 7.8  HGB 11.7* 12.9*  HCT 36.1* 39.2  MCV 97.6 94.5  PLT 496* 382*   Basic Metabolic Panel Recent Labs    08/12/20 0102 08/13/20 0022 08/14/20 0057  NA 140 139 137  K 3.8 3.7 4.0  CL 106 100 98  CO2 $Re'25 29 28  'OHu$ GLUCOSE 130* 146* 202*  BUN 21 23 32*  CREATININE 1.68* 1.62* 1.62*  CALCIUM 8.3* 8.7* 8.9  MG 2.6*  --   --    Liver Function Tests No results for input(s): AST, ALT, ALKPHOS, BILITOT, PROT, ALBUMIN in the last 72 hours. No results for input(s): LIPASE, AMYLASE in the last 72 hours. Cardiac Enzymes No results for input(s): CKTOTAL, CKMB, CKMBINDEX, TROPONINI in the last 72 hours.  BNP: BNP (last 3 results) Recent Labs    12/06/19 1551 08/09/20 1643  BNP 792.4* 339.4*    ProBNP (last 3 results) Recent Labs    12/17/19 1327  PROBNP 593.0*     D-Dimer No results for input(s): DDIMER in the last 72 hours. Hemoglobin A1C No results for input(s): HGBA1C in the last 72 hours. Fasting Lipid Panel No results for input(s): CHOL, HDL, LDLCALC, TRIG, CHOLHDL, LDLDIRECT in the last 72 hours. Thyroid Function Tests No results for input(s): TSH, T4TOTAL, T3FREE, THYROIDAB in the  last 72 hours.  Invalid input(s): FREET3  Other results:   Imaging    CT CHEST WO CONTRAST  Result Date: 08/13/2020 CLINICAL DATA:  Respiratory failure.  Possible amiodarone toxicity. EXAM: CT CHEST WITHOUT CONTRAST TECHNIQUE: Multidetector CT imaging of the chest was performed following the standard protocol without IV contrast. COMPARISON:  02/11/2016 FINDINGS: Cardiovascular: Cardiac enlargement. Coronary artery and aortic calcification. Probable coronary stents. No aortic aneurysm. Mediastinum/Nodes: Mild prominence of mediastinal lymph nodes with pretracheal nodes measuring up to about 1.4 cm short axis dimension. Similar appearance to previous  study, likely reactive. Esophagus is decompressed. Lungs/Pleura: Small right pleural effusion. Patchy and confluent areas of consolidation throughout both lungs but most prominent in the bases. Appearance suggests pneumonia. Amiodarone toxicity would be a possibility as well. Edema less likely. Clinical correlation is suggested. Upper Abdomen: No acute abnormality. Musculoskeletal: Degenerative changes in the spine with bridging anterior osteophytes. IMPRESSION: 1. Cardiac enlargement. 2. Small right pleural effusion 3. Patchy and confluent areas of consolidation throughout both lungs but most prominent in the bases. Appearance suggests pneumonia. Amiodarone toxicity would be a possibility as well. Clinical correlation is suggested. 4. Mild prominence of mediastinal lymph nodes, likely reactive. 5. Aortic atherosclerosis. Aortic Atherosclerosis (ICD10-I70.0). Electronically Signed   By: Lucienne Capers M.D.   On: 08/13/2020 21:02     Medications:     Scheduled Medications: . apixaban  5 mg Oral BID  . atorvastatin  80 mg Oral QHS  . carvedilol  6.25 mg Oral BID WC  . cholecalciferol  1,000 Units Oral Daily  . clopidogrel  75 mg Oral Daily  . empagliflozin  10 mg Oral Daily  . ezetimibe  10 mg Oral QHS  . icosapent Ethyl  2 g Oral BID  . insulin aspart  0-5 Units Subcutaneous QHS  . insulin aspart  0-9 Units Subcutaneous TID WC  . mouth rinse  15 mL Mouth Rinse BID  . methylPREDNISolone (SOLU-MEDROL) injection  40 mg Intravenous Q12H  . mometasone-formoterol  2 puff Inhalation BID  . ranolazine  500 mg Oral BID  . sacubitril-valsartan  1 tablet Oral BID    Infusions: . azithromycin 500 mg (08/13/20 1123)  . cefTRIAXone (ROCEPHIN)  IV 200 mL/hr at 08/14/20 0807    PRN Medications:      Assessment/Plan   1. Acute on chronic systolic CHF: Prior ischemic cardiomyopathy, improved after PCI in 2017. Echo 03/2018 with EF back down to 35-40% and moderate-severe RV dysfunction. This was  in the setting of atrial fibrillation with RVR. EF 40% on TEE 04/2018.  Echo was done in 3/20, showing EF up to 50%.  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  Echo in 7/21 with EF 25-30%, severe RV dysfunction.  TEE in 10/21, however, showed EF up to 50%.  He had PCI to LAD in 10/21.  Echo 08/12/20 with EF stable 50-55%, normal RV.  He is now back in NSR.  Has been off lasix. Weight climbing - Restart po diuretics - Continue Entresto 49/51 bid.   -Continue Coreg at lower dose for now 6.25 mg bid.  - Continue empagliflozin 10 mg daily.  2. Atrial fibrillation/Atrial flutter: As above, concerned for possible tachy-mediated CMP in past. Had successful DC-CV on 03/29/18 but then back in atrial fibrillation.Ranolazine added, but failed DCCV again 04/02/18. DCCV 04/19/18 was successful and he remained in NSR on amiodarone + ranolazine. He opted against atrial fibrillation ablation in the past (saw Dr. Rayann Heman).  He  was noted to be in atrial fibrillation at last clinic visit and plan was made for DCCV if he did not convert, but he was admitted with syncope and rapid SVT in 200s.  SVT was regular, ?1:1 atrial flutter.  In atrial flutter rate 100s on amiodarone gtt => DCCV to NSR on 08/11/20. NSR today. Amiodarone stopped due to concern for amiodarone toxicity. ESR 130 - Continue ranolazine 500 mg bid.  - Continue apixaban.   - Discussed with EP (Dr. Curt Bears).  He recommends DCCV only this admission, then close followup with Dr. Rayann Heman as patient is going to need atrial fibrillation + atrial flutter ablation.  Will need healing of lung process prior to ablation.  - If he needs anti-arrhythmic in future, Tikosyn would be option but needs amiodarone wash-out.  3. CKD: Stage 3.  Creatinine stable at 1.7, follow.  4. CAD: History of DES to RCA in 9/17. UnderwentLHC11/20/19 with PCI to prox left circ.LHC in 10/21 with PCI to LAD.  No s/s angina -He is on Eliquis so no ASA.   -  Continue Plavix ideally x 1 yr (to 10/22).  - Continue atorvastatin and vascepa.  5. DM2: Per primary.  6. Aortic stenosis: Mild by echo this admission.   7. Mitral regurgitation: Mild by echo this admission.  8. Acute Hypoxic Respiratory Failure: CT chest with patchy consolidation c/w PNA versus amiodarone toxicity.  No fever, normal PCT and WBCs. ESR 130.  - Course of ceftriaxone/azithromycin, but suspect not bacterial PNA.  - Concern for amiodarone toxicity, amiodarone stopped and Solumedrol IV begun.  Pulmonary following.  Appreciate their input.    Length of Stay: [place], MD  08/14/2020, 9:09 AM  Advanced Heart Failure Team Pager [phone] (M-F; 7a - 5p)  Please contact [facility] for night-coverage after hours (5p -7a ) and weekends on [email or web address]

## 2020-08-15 DIAGNOSIS — R55 Syncope and collapse: Secondary | ICD-10-CM | POA: Diagnosis not present

## 2020-08-15 DIAGNOSIS — I5022 Chronic systolic (congestive) heart failure: Secondary | ICD-10-CM | POA: Diagnosis not present

## 2020-08-15 DIAGNOSIS — J9601 Acute respiratory failure with hypoxia: Secondary | ICD-10-CM | POA: Diagnosis not present

## 2020-08-15 DIAGNOSIS — I5043 Acute on chronic combined systolic (congestive) and diastolic (congestive) heart failure: Secondary | ICD-10-CM | POA: Diagnosis not present

## 2020-08-15 DIAGNOSIS — I480 Paroxysmal atrial fibrillation: Secondary | ICD-10-CM | POA: Diagnosis not present

## 2020-08-15 LAB — GLUCOSE, CAPILLARY
Glucose-Capillary: 333 mg/dL — ABNORMAL HIGH (ref 70–99)
Glucose-Capillary: 322 mg/dL — ABNORMAL HIGH (ref 70–99)
Glucose-Capillary: 268 mg/dL — ABNORMAL HIGH (ref 70–99)
Glucose-Capillary: 281 mg/dL — ABNORMAL HIGH (ref 70–99)

## 2020-08-15 LAB — BASIC METABOLIC PANEL
Anion gap: 9 (ref 5–15)
BUN: 48 mg/dL — ABNORMAL HIGH (ref 8–23)
CO2: 28 mmol/L (ref 22–32)
Calcium: 8.7 mg/dL — ABNORMAL LOW (ref 8.9–10.3)
Chloride: 96 mmol/L — ABNORMAL LOW (ref 98–111)
Creatinine, Ser: 1.76 mg/dL — ABNORMAL HIGH (ref 0.61–1.24)
GFR, Estimated: 39 mL/min — ABNORMAL LOW (ref >60)
Glucose, Bld: 342 mg/dL — ABNORMAL HIGH (ref 70–99)
Potassium: 3.8 mmol/L (ref 3.5–5.1)
Sodium: 133 mmol/L — ABNORMAL LOW (ref 135–145)

## 2020-08-15 MED ORDER — INSULIN GLARGINE 100 UNIT/ML ~~LOC~~ SOLN
15.0000 [IU] | Freq: Every day | SUBCUTANEOUS | Status: DC
  Administered 2020-08-15 – 2020-08-16 (×2): 15 [IU] via SUBCUTANEOUS
  Filled 2020-08-15 (×2): qty 0.15

## 2020-08-15 MED ORDER — METHYLPREDNISOLONE SODIUM SUCC 40 MG IJ SOLR
40.0000 mg | Freq: Two times a day (BID) | INTRAMUSCULAR | Status: AC
  Administered 2020-08-15 – 2020-08-16 (×3): 40 mg via INTRAVENOUS
  Filled 2020-08-15 (×3): qty 1

## 2020-08-15 MED ORDER — TORSEMIDE 20 MG PO TABS
40.0000 mg | ORAL_TABLET | Freq: Every day | ORAL | Status: DC
  Administered 2020-08-15: 40 mg via ORAL
  Filled 2020-08-15: qty 2

## 2020-08-15 MED ORDER — POTASSIUM CHLORIDE CRYS ER 20 MEQ PO TBCR
20.0000 meq | EXTENDED_RELEASE_TABLET | Freq: Once | ORAL | Status: AC
  Administered 2020-08-15: 20 meq via ORAL
  Filled 2020-08-15: qty 1

## 2020-08-15 NOTE — Progress Notes (Signed)
PROGRESS NOTE    David Huynh  UUV:[id number] DOB: 03/03/1941 DOA: 08/09/2020 PCP: Bradd Canary, MD   Brief Narrative:  HPI on 08/09/2020 by Dr. Benita Gutter David Huynh is a 80 y.o. male with medical history significant for CAD, chronic systolic CHF,PAF,HTN, HLD, DM, moderate AS, CKD 3, mild persistent asthma who presents with concerns of increasing shortness of breath and syncope. About 2 days ago he began to note worsening shortness of breath with exertion and then progressively became short of breath even with just standing.  Today he got up out of bed and was getting ready to follow up with cardiology when he felt dizzy with heart palpitations and fell back onto his bed with loss of consciousness.  Wife called EMS afterwards.  Noted to be in atrial fibrillation/atrial flutter with rates of 100-120 by EMS.  CBG of 340.  Also noted to have some orthostatic changes from laying to sitting going from systolic of 140-1 08.  He has been having some increasing lower extremity and abdominal edema for past several weeks.He was last seen by cardiology on 07/22/20 and has been progressively getting increase in Torsemide since January due to increase in wight. Also had increase in his amiodarone since he was noted to be in atrial flutter during that visit. He is followed by endocrinology for his diabetes and previously had his Metformin reduced due to worsening GFR of less than 45.  Interim history Patient presented with worsening shortness of breath and syncope.  Found to have atrial fibrillation/flutter along with orthostatic changes.  Cardiology was consulted and appreciated, currently being treated for CHF exacerbation along with A. fib/a flutter.  Status post cardioversion and in sinus rhythm. Currently requiring HFNC.  Hospitalization complicated by respiratory failure.  Pulmonology consulted.  CT obtained showing likely  amiodarone toxicity, patient started on steroids. Assessment & Plan    Paroxysmal atrial fibrillation/flutter with RVR -Patient noted to have heart rates of up to 220s with no response to vagal maneuvers.  He was treated with adenosine x2 and was noted to have a drop in blood pressure. -CCM was consulted and patient was placed on IV amiodarone bolus and continued drip. -Cardiology consulted and appreciated -Status post cardioversion 08/11/20- remains in sinus rhythm today -Patient will need follow-up with electrophysiology -Continue Eliquis -Given respiratory failure, and possibility of amiodarone lung toxicity, amiodarone discontinued  Acute combined systolic and diastolic heart failure/acute respiratory failure with hypoxia -Patient was noted to have increased shortness of breath and was hypoxic in the 80s and required supplemental oxygen.  Does not use oxygen at home. -Prior to admission, torsemide was increased to 60 mg twice daily by cardiology. -Echocardiogram shows an EF of 50 to 55%, LV demonstrates regional wall motion abnormalities.  LV diastolic parameters indeterminate. -Chest x-ray on admission showed bilateral pleural effusions -Continue monitor intake and output, daily weights -Was placed on placed on IV Lasix 80 mg twice daily with metolazone however currently held today.  Cardiology will possibly transition to oral diuretics on 08/15/2020 -Continue Entresto (Cardiology increasing dose), Coreg, Jardiance -Continue supplemental oxygen, and wean as possible-  Was requiring 14L of HFNC, now down to 9L HFNC, will continue to wean  -Discussed sleep study with the patient, he was adamant about not wanting to have one done and completely refuses -Pulmonology consulted and appreciated  Patchy consolidation, likely amiodarone toxicity -CXR on 08/12/2020 concerning for pneumonia however procalcitonin unremarkable 0.14 -Currently afebrile -patient with leukocytosis however this may be reactive -Patient was  started on azithromycin and ceftriaxone however  given that patient is afebrile and procalcitonin has been unremarkable, antibiotics were discontinued by pulmonology -CT chest showed patchy and confluent areas of consolidation throughout both lungs most prominent in the bases appearance suggest pneumonia.  Amiodarone toxicity would be a possibility as well. -Patient was started on IV Solu-Medrol - will need a long steroid taper as per pulmonology   Syncope with loss of consciousness -Likely secondary to the above -Echocardiogram is unable to visualize mitral valve.  Moderate tricuspid valve regurgitation.  Mild aortic valve stenosis.  Aortic dilatation measuring 40 mm.  Leukocytosis/thrombocytosis -Leukocytosis is resolved today  -suspect reactive, no signs or source of infection at this time  Chronic kidney disease, stage IIIb -Creatinine appears to be stable, currently 1.76  -continue to monitor BMP  Anemia of chronic disease -Hemoglobin appears to be stable, continue monitor CBC  History of CAD -With drug-eluting stent -Currently no chest -Continue Coreg, Plavix, Eliquis, Ranexa  Diabetes mellitus, type II -Hemoglobin A1c was 6.5 approximately 2 months ago -Continue insulin sliding scale CBG monitoring   History of mild persistent asthma -Continue bronchodilators as needed and incentive spirometer -Currently no wheezing on exam  DVT Prophylaxis Eliquis  Code Status: Full  Family Communication: None at bedside  Disposition Plan:  Status is: Inpatient  Remains inpatient appropriate because:IV treatments appropriate due to intensity of illness or inability to take PO   Dispo: The patient is from: Home              Anticipated d/c is to: Home              Patient currently is not medically stable to d/c.   Difficult to place patient No   Consultants PCCM Cardiology  Procedures  Echocardiogram Cardioversion  Antibiotics   Anti-infectives (From admission, onward)   Start     Dose/Rate Route Frequency Ordered  Stop   08/16/20 0900  sulfamethoxazole-trimethoprim (BACTRIM DS) 800-160 MG per tablet 1 tablet        1 tablet Oral Once per day on Mon Wed Fri 08/14/20 0923     08/13/20 0800  azithromycin (ZITHROMAX) 500 mg in sodium chloride 0.9 % 250 mL IVPB  Status:  Discontinued        500 mg 250 mL/hr over 60 Minutes Intravenous Every 24 hours 08/13/20 0720 08/14/20 0922   08/13/20 0800  cefTRIAXone (ROCEPHIN) 1 g in sodium chloride 0.9 % 100 mL IVPB  Status:  Discontinued        1 g 200 mL/hr over 30 Minutes Intravenous Every 24 hours 08/13/20 0720 08/14/20 0922   08/09/20 2015  cefTRIAXone (ROCEPHIN) 1 g in sodium chloride 0.9 % 100 mL IVPB        1 g 200 mL/hr over 30 Minutes Intravenous  Once 08/09/20 2004 08/09/20 2112   08/09/20 2015  azithromycin (ZITHROMAX) 500 mg in sodium chloride 0.9 % 250 mL IVPB        500 mg 250 mL/hr over 60 Minutes Intravenous  Once 08/09/20 2004 08/09/20 2135      Subjective:   [name] seen and examined today.   Patient has no complaints today. Hoping to have his oxygen turned down to 4L. Wants to go home. Denies chest pain. Feels breathing is the same. Denies current abdominal pain, N/V/D/C, dizziness or headache. Objective:   Vitals:   08/14/20 2300 08/15/20 0300 08/15/20 0730 08/15/20 0903  BP: 122/84 131/65 (!) 140/98   Pulse: 73 67 68  Resp: 19 20 19    Temp: 97.7 F (36.5 C) 97.6 F (36.4 C) 98 F (36.7 C)   TempSrc: Oral Oral Oral   SpO2: 93% 92% 90% 91%  Weight:  124.4 kg    Height:        Intake/Output Summary (Last 24 hours) at 08/15/2020 1106 Last data filed at 08/15/2020 0800 Gross per 24 hour  Intake 571.61 ml  Output 2200 ml  Net -1628.39 ml   Filed Weights   08/13/20 0258 08/14/20 0320 08/15/20 0300  Weight: 125.6 kg 122.9 kg 124.4 kg   Exam  General: Well developed, chronically ill-appearing, NAD  HEENT: NCAT, mucous membranes moist.   Cardiovascular: S1 S2 auscultated, RRR, 2/6 SEM  Respiratory: Diminished breath  sounds (anteriorly)  Abdomen: Soft, obese, nontender, nondistended, + bowel sounds  Extremities: warm dry without cyanosis clubbing.  LE edema improving  Neuro: AAOx3, nonfocal  Psych: pleasant, appropriate mood and affect  Data Reviewed: I have personally reviewed following labs and imaging studies  CBC: Recent Labs  Lab 08/09/20 1643 08/10/20 0541 08/11/20 0139 08/12/20 0102 08/14/20 0057  WBC 10.7* 13.3* 12.6* 13.4* 7.8  NEUTROABS 8.6*  --  9.4*  --   --   HGB 11.5* 11.8* 11.6* 11.7* 12.9*  HCT 35.0* 35.4* 35.2* 36.1* 39.2  MCV 95.6 95.7 95.4 97.6 94.5  PLT 394 458* 477* 496* 541*   Basic Metabolic Panel: Recent Labs  Lab 08/11/20 0139 08/12/20 0102 08/13/20 0022 08/14/20 0057 08/15/20 0104  NA 140 140 139 137 133*  K 3.4* 3.8 3.7 4.0 3.8  CL 105 106 100 98 96*  CO2 25 25 29 28 28   GLUCOSE 132* 130* 146* 202* 342*  BUN 19 21 23  32* 48*  CREATININE 1.63* 1.68* 1.62* 1.62* 1.76*  CALCIUM 8.2* 8.3* 8.7* 8.9 8.7*  MG 2.5* 2.6*  --   --   --    GFR: Estimated Creatinine Clearance: 46.4 mL/min (A) (by C-G formula based on SCr of 1.76 mg/dL (H)). Liver Function Tests: Recent Labs  Lab 08/10/20 1300 08/11/20 0139  AST 23 28  ALT 21 25  ALKPHOS 64 65  BILITOT 0.8 0.8  PROT 6.9 6.5  ALBUMIN 2.7* 2.6*   No results for input(s): LIPASE, AMYLASE in the last 168 hours. No results for input(s): AMMONIA in the last 168 hours. Coagulation Profile: No results for input(s): INR, PROTIME in the last 168 hours. Cardiac Enzymes: No results for input(s): CKTOTAL, CKMB, CKMBINDEX, TROPONINI in the last 168 hours. BNP (last 3 results) Recent Labs    12/17/19 1327  PROBNP 593.0*   HbA1C: No results for input(s): HGBA1C in the last 72 hours. CBG: Recent Labs  Lab 08/14/20 0602 08/14/20 1121 08/14/20 1531 08/14/20 2116 08/15/20 0606  GLUCAP 194* 227* 260* 280* 333*   Lipid Profile: No results for input(s): CHOL, HDL, LDLCALC, TRIG, CHOLHDL, LDLDIRECT in the  last 72 hours. Thyroid Function Tests: No results for input(s): TSH, T4TOTAL, FREET4, T3FREE, THYROIDAB in the last 72 hours. Anemia Panel: No results for input(s): VITAMINB12, FOLATE, FERRITIN, TIBC, IRON, RETICCTPCT in the last 72 hours. Urine analysis:    Component Value Date/Time   COLORURINE STRAW (A) 03/25/2018 1627   APPEARANCEUR CLEAR 03/25/2018 1627   LABSPEC >1.030 (H) 03/25/2018 1627   PHURINE 5.5 03/25/2018 1627   GLUCOSEU 100 (A) 03/25/2018 1627   HGBUR TRACE (A) 03/25/2018 1627   BILIRUBINUR NEGATIVE 03/25/2018 1627   KETONESUR NEGATIVE 03/25/2018 1627   PROTEINUR 100 (A) 03/25/2018 1627  NITRITE NEGATIVE 03/25/2018 1627   LEUKOCYTESUR NEGATIVE 03/25/2018 1627   Sepsis Labs: @LABRCNTIP (procalcitonin:4,lacticidven:4)  ) Recent Results (from the past 240 hour(s))  Resp Panel by RT-PCR (Flu A&B, Covid) Nasopharyngeal Swab     Status: None   Collection Time: 08/09/20  5:09 PM   Specimen: Nasopharyngeal Swab; Nasopharyngeal(NP) swabs in vial transport medium  Result Value Ref Range Status   SARS Coronavirus 2 by RT PCR NEGATIVE NEGATIVE Final    Comment: (NOTE) SARS-CoV-2 target nucleic acids are NOT DETECTED.  The SARS-CoV-2 RNA is generally detectable in upper respiratory specimens during the acute phase of infection. The lowest concentration of SARS-CoV-2 viral copies this assay can detect is 138 copies/mL. A negative result does not preclude SARS-Cov-2 infection and should not be used as the sole basis for treatment or other patient management decisions. A negative result may occur with  improper specimen collection/handling, submission of specimen other than nasopharyngeal swab, presence of viral mutation(s) within the areas targeted by this assay, and inadequate number of viral copies(<138 copies/mL). A negative result must be combined with clinical observations, patient history, and epidemiological information. The expected result is Negative.  Fact  Sheet for Patients:  08/11/20  Fact Sheet for Healthcare Providers:  BloggerCourse.com  This test is no t yet approved or cleared by the [employer] FDA and  has been authorized for detection and/or diagnosis of SARS-CoV-2 by FDA under an Emergency Use Authorization (EUA). This EUA will remain  in effect (meaning this test can be used) for the duration of the COVID-19 declaration under Section 564(b)(1) of the Act, 21 U.S.C.section 360bbb-3(b)(1), unless the authorization is terminated  or revoked sooner.       Influenza A by PCR NEGATIVE NEGATIVE Final   Influenza B by PCR NEGATIVE NEGATIVE Final    Comment: (NOTE) The Xpert Xpress SARS-CoV-2/FLU/RSV plus assay is intended as an aid in the diagnosis of influenza from Nasopharyngeal swab specimens and should not be used as a sole basis for treatment. Nasal washings and aspirates are unacceptable for Xpert Xpress SARS-CoV-2/FLU/RSV testing.  Fact Sheet for Patients: [place]  Fact Sheet for Healthcare Providers: BloggerCourse.com  This test is not yet approved or cleared by the [employer] FDA and has been authorized for detection and/or diagnosis of SARS-CoV-2 by FDA under an Emergency Use Authorization (EUA). This EUA will remain in effect (meaning this test can be used) for the duration of the COVID-19 declaration under Section 564(b)(1) of the Act, 21 U.S.C. section 360bbb-3(b)(1), unless the authorization is terminated or revoked.  Performed at [facility], [address], [place], [place]   Culture, blood (routine x 2)     Status: None   Collection Time: 08/09/20  8:15 PM   Specimen: BLOOD  Result Value Ref Range Status   Specimen Description BLOOD LEFT ANTECUBITAL  Final   Special Requests   Final    BOTTLES DRAWN AEROBIC AND ANAEROBIC Blood Culture adequate volume   Culture    Final    NO GROWTH 5 DAYS Performed at [facility], [address], [place], [place]    Report Status 08/14/2020 FINAL  Final  Culture, blood (routine x 2)     Status: None   Collection Time: 08/09/20  8:20 PM   Specimen: BLOOD  Result Value Ref Range Status   Specimen Description BLOOD RIGHT ANTECUBITAL  Final   Special Requests   Final    BOTTLES DRAWN AEROBIC AND ANAEROBIC Blood Culture  adequate volume   Culture   Final    NO GROWTH 5 DAYS Performed at [facility], [address] [place], [place] [address]    Report Status 08/14/2020 FINAL  Final  MRSA PCR Screening     Status: None   Collection Time: 08/11/20  1:08 AM   Specimen: Nasal Mucosa; Nasopharyngeal  Result Value Ref Range Status   MRSA by PCR NEGATIVE NEGATIVE Final    Comment:        The GeneXpert MRSA Assay (FDA approved for NASAL specimens only), is one component of a comprehensive MRSA colonization surveillance program. It is not intended to diagnose MRSA infection nor to guide or monitor treatment for MRSA infections. Performed at [facility], [address], [place], [place] [address]       Radiology Studies: CT CHEST WO CONTRAST  Result Date: 08/13/2020 CLINICAL DATA:  Respiratory failure.  Possible amiodarone toxicity. EXAM: CT CHEST WITHOUT CONTRAST TECHNIQUE: Multidetector CT imaging of the chest was performed following the standard protocol without IV contrast. COMPARISON:  02/11/2016 FINDINGS: Cardiovascular: Cardiac enlargement. Coronary artery and aortic calcification. Probable coronary stents. No aortic aneurysm. Mediastinum/Nodes: Mild prominence of mediastinal lymph nodes with pretracheal nodes measuring up to about 1.4 cm short axis dimension. Similar appearance to previous study, likely reactive. Esophagus is decompressed. Lungs/Pleura: Small right pleural effusion. Patchy and confluent areas of consolidation throughout both lungs but most prominent in the bases.  Appearance suggests pneumonia. Amiodarone toxicity would be a possibility as well. Edema less likely. Clinical correlation is suggested. Upper Abdomen: No acute abnormality. Musculoskeletal: Degenerative changes in the spine with bridging anterior osteophytes. IMPRESSION: 1. Cardiac enlargement. 2. Small right pleural effusion 3. Patchy and confluent areas of consolidation throughout both lungs but most prominent in the bases. Appearance suggests pneumonia. Amiodarone toxicity would be a possibility as well. Clinical correlation is suggested. 4. Mild prominence of mediastinal lymph nodes, likely reactive. 5. Aortic atherosclerosis. Aortic Atherosclerosis (ICD10-I70.0). Electronically Signed   By: Burman Nieves M.D.   On: 08/13/2020 21:02     Scheduled Meds: . apixaban  5 mg Oral BID  . atorvastatin  80 mg Oral QHS  . calcium-vitamin D  1 tablet Oral BID  . carvedilol  6.25 mg Oral BID WC  . clopidogrel  75 mg Oral Daily  . empagliflozin  10 mg Oral Daily  . ezetimibe  10 mg Oral QHS  . icosapent Ethyl  2 g Oral BID  . insulin aspart  0-5 Units Subcutaneous QHS  . insulin aspart  0-9 Units Subcutaneous TID WC  . loratadine  10 mg Oral Daily  . mouth rinse  15 mL Mouth Rinse BID  . methylPREDNISolone (SOLU-MEDROL) injection  40 mg Intravenous Q12H  . mometasone-formoterol  2 puff Inhalation BID  . pantoprazole  40 mg Oral QHS  . potassium chloride  20 mEq Oral Once  . ranolazine  500 mg Oral BID  . sacubitril-valsartan  1 tablet Oral BID  . [START ON 08/16/2020] sulfamethoxazole-trimethoprim  1 tablet Oral Once per day on Mon Wed Fri   Continuous Infusions:    LOS: 6 days   Time Spent in minutes   30 minutes  Selena Swaminathan D.O. on 08/15/2020 at 11:06 AM  Between 7am to 7pm - Please see pager noted on [email or web address]  After 7pm go to [email or web address]  And look for the night coverage person covering for me after hours  [facility] Office  [phone]

## 2020-08-15 NOTE — Progress Notes (Signed)
Patient ID: David Huynh, male   DOB: August 24, 1940, 80 y.o.   MRN: [id number]     Advanced Heart Failure Rounding Note  PCP-Cardiologist: Loralie Champagne, MD   Subjective:    DCCV to NSR on 3/30.  Echo showed EF still in 50-55% range (stable) with normal RV, moderate biatrial enlargement, mild MR, moderate TR, mild AS.   CT chest with patchy consolidation consistent with PNA versus amiodarone toxicity. ESR 130.  Started on solumedrol for amiodarone toxicity. Breathing better but still with rapid desaturations into high 70s and low 80s with talking.   Off lasix. Weight up 4 pounds. Creatinine stable.   Denies orthopnea or PND. No bleeding with AC.   Objective:   Weight Range: 124.4 kg Body mass index is 37.2 kg/m.   Vital Signs:   Temp:  [97.6 F (36.4 C)-98.6 F (37 C)] 98.2 F (36.8 C) (04/03 1058) Pulse Rate:  [67-77] 69 (04/03 1058) Resp:  [15-20] 18 (04/03 1058) BP: (97-140)/(62-98) 136/78 (04/03 1058) SpO2:  [90 %-94 %] 91 % (04/03 1058) Weight:  [124.4 kg] 124.4 kg (04/03 0300) Last BM Date: 08/09/20  Weight change: Filed Weights   08/13/20 0258 08/14/20 0320 08/15/20 0300  Weight: 125.6 kg 122.9 kg 124.4 kg    Intake/Output:   Intake/Output Summary (Last 24 hours) at 08/15/2020 1335 Last data filed at 08/15/2020 1200 Gross per 24 hour  Intake 571.61 ml  Output 2650 ml  Net -2078.39 ml      Physical Exam   General:  Sitting up in bed  No resp difficulty HEENT: normal Neck: supple. no JVD. Carotids 2+ bilat; no bruits. No lymphadenopathy or thryomegaly appreciated. Cor: PMI nondisplaced. Regular rate & rhythm. No rubs, gallops or murmurs. Lungs: decreased throughout. Mild crackles  Abdomen: soft, nontender, nondistended. No hepatosplenomegaly. No bruits or masses. Good bowel sounds. Extremities: no cyanosis, clubbing, rash, edema Neuro: alert & orientedx3, cranial nerves grossly intact. moves all 4 extremities w/o difficulty. Affect  pleasant    Telemetry   NSR 70-80s + pvsc   Labs    CBC Recent Labs    08/14/20 0057  WBC 7.8  HGB 12.9*  HCT 39.2  MCV 94.5  PLT 009*   Basic Metabolic Panel Recent Labs    08/14/20 0057 08/15/20 0104  NA 137 133*  K 4.0 3.8  CL 98 96*  CO2 28 28  GLUCOSE 202* 342*  BUN 32* 48*  CREATININE 1.62* 1.76*  CALCIUM 8.9 8.7*   Liver Function Tests No results for input(s): AST, ALT, ALKPHOS, BILITOT, PROT, ALBUMIN in the last 72 hours. No results for input(s): LIPASE, AMYLASE in the last 72 hours. Cardiac Enzymes No results for input(s): CKTOTAL, CKMB, CKMBINDEX, TROPONINI in the last 72 hours.  BNP: BNP (last 3 results) Recent Labs    12/06/19 1551 08/09/20 1643  BNP 792.4* 339.4*    ProBNP (last 3 results) Recent Labs    12/17/19 1327  PROBNP 593.0*     D-Dimer No results for input(s): DDIMER in the last 72 hours. Hemoglobin A1C No results for input(s): HGBA1C in the last 72 hours. Fasting Lipid Panel No results for input(s): CHOL, HDL, LDLCALC, TRIG, CHOLHDL, LDLDIRECT in the last 72 hours. Thyroid Function Tests No results for input(s): TSH, T4TOTAL, T3FREE, THYROIDAB in the last 72 hours.  Invalid input(s): FREET3  Other results:   Imaging    No results found.   Medications:     Scheduled Medications: . apixaban  5 mg Oral BID  .  atorvastatin  80 mg Oral QHS  . calcium-vitamin D  1 tablet Oral BID  . carvedilol  6.25 mg Oral BID WC  . clopidogrel  75 mg Oral Daily  . empagliflozin  10 mg Oral Daily  . ezetimibe  10 mg Oral QHS  . icosapent Ethyl  2 g Oral BID  . insulin aspart  0-5 Units Subcutaneous QHS  . insulin aspart  0-9 Units Subcutaneous TID WC  . loratadine  10 mg Oral Daily  . mouth rinse  15 mL Mouth Rinse BID  . methylPREDNISolone (SOLU-MEDROL) injection  40 mg Intravenous Q12H  . mometasone-formoterol  2 puff Inhalation BID  . pantoprazole  40 mg Oral QHS  . ranolazine  500 mg Oral BID  .  sacubitril-valsartan  1 tablet Oral BID  . [START ON 08/16/2020] sulfamethoxazole-trimethoprim  1 tablet Oral Once per day on Mon Wed Fri  . torsemide  40 mg Oral Daily    Infusions:   PRN Medications:      Assessment/Plan   1. Acute on chronic systolic CHF: Prior ischemic cardiomyopathy, improved after PCI in 2017. Echo 03/2018 with EF back down to 35-40% and moderate-severe RV dysfunction. This was in the setting of atrial fibrillation with RVR. EF 40% on TEE 04/2018.  Echo was done in 3/20, showing EF up to 50%.  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  Echo in 7/21 with EF 25-30%, severe RV dysfunction.  TEE in 10/21, however, showed EF up to 50%.  He had PCI to LAD in 10/21.  Echo 08/12/20 with EF stable 50-55%, normal RV.  He is now back in NSR.  Has been off lasix. Weight climbing - Restart po diuretics. Torsemide 40 daily  - Continue Entresto 49/51 bid.   -Continue Coreg at lower dose for now 6.25 mg bid.  - Continue empagliflozin 10 mg daily.  2. Atrial fibrillation/Atrial flutter: As above, concerned for possible tachy-mediated CMP in past. Had successful DC-CV on 03/29/18 but then back in atrial fibrillation.Ranolazine added, but failed DCCV again 04/02/18. DCCV 04/19/18 was successful and he remained in NSR on amiodarone + ranolazine. He opted against atrial fibrillation ablation in the past (saw Dr. Rayann Heman).  He was noted to be in atrial fibrillation at last clinic visit and plan was made for DCCV if he did not convert, but he was admitted with syncope and rapid SVT in 200s.  SVT was regular, ?1:1 atrial flutter.  In atrial flutter rate 100s on amiodarone gtt => DCCV to NSR on 08/11/20. NSR today. Amiodarone stopped due to concern for amiodarone toxicity. ESR 130 - Continue ranolazine 500 mg bid.  - Continue apixaban.   - Discussed with EP (Dr. Curt Bears).  He recommends DCCV only this admission, then close followup with Dr. Rayann Heman as patient is  going to need atrial fibrillation + atrial flutter ablation.  Will need healing of lung process prior to ablation.  - If he needs anti-arrhythmic in future, Tikosyn would be option but needs amiodarone wash-out.  3. CKD: Stage 3.  Creatinine stable at 1.7, follow.  4. CAD: History of DES to RCA in 9/17. UnderwentLHC11/20/19 with PCI to prox left circ.LHC in 10/21 with PCI to LAD.  No s/s angina -He is on Eliquis so no ASA.   - Continue Plavix ideally x 1 yr (to 10/22).  - Continue atorvastatin and vascepa.  5. DM2: Per primary.  6. Aortic stenosis: Mild by echo this admission.   7.  Mitral regurgitation: Mild by echo this admission.  8. Acute Hypoxic Respiratory Failure: CT chest with patchy consolidation c/w PNA versus amiodarone toxicity.  No fever, normal PCT and WBCs. ESR 130.  - Course of ceftriaxone/azithromycin, but suspect not bacterial PNA.  - Concern for amiodarone toxicity, amiodarone stopped and Solumedrol IV begun. Will change admin times (currently 345a and 345p)  Pulmonary following.  Appreciate their input.      Length of Stay: 6  Glori Bickers, MD  08/15/2020, 1:35 PM  Advanced Heart Failure Team Pager [phone] (M-F; [place])  Please contact [facility] for night-coverage after hours (5p -7a ) and weekends on [email or web address]

## 2020-08-16 ENCOUNTER — Ambulatory Visit: Payer: Medicare Other | Admitting: Internal Medicine

## 2020-08-16 DIAGNOSIS — I480 Paroxysmal atrial fibrillation: Secondary | ICD-10-CM | POA: Diagnosis not present

## 2020-08-16 DIAGNOSIS — I5043 Acute on chronic combined systolic (congestive) and diastolic (congestive) heart failure: Secondary | ICD-10-CM | POA: Diagnosis not present

## 2020-08-16 DIAGNOSIS — R55 Syncope and collapse: Secondary | ICD-10-CM | POA: Diagnosis not present

## 2020-08-16 DIAGNOSIS — J9601 Acute respiratory failure with hypoxia: Secondary | ICD-10-CM | POA: Diagnosis not present

## 2020-08-16 DIAGNOSIS — J8489 Other specified interstitial pulmonary diseases: Secondary | ICD-10-CM | POA: Diagnosis not present

## 2020-08-16 LAB — BASIC METABOLIC PANEL
Anion gap: 12 (ref 5–15)
BUN: 59 mg/dL — ABNORMAL HIGH (ref 8–23)
CO2: 27 mmol/L (ref 22–32)
Calcium: 8.9 mg/dL (ref 8.9–10.3)
Chloride: 97 mmol/L — ABNORMAL LOW (ref 98–111)
Creatinine, Ser: 1.74 mg/dL — ABNORMAL HIGH (ref 0.61–1.24)
GFR, Estimated: 39 mL/min — ABNORMAL LOW (ref >60)
Glucose, Bld: 297 mg/dL — ABNORMAL HIGH (ref 70–99)
Potassium: 5.9 mmol/L — ABNORMAL HIGH (ref 3.5–5.1)
Sodium: 136 mmol/L (ref 135–145)

## 2020-08-16 LAB — HEMOGLOBIN AND HEMATOCRIT, BLOOD
HCT: 38.8 % — ABNORMAL LOW (ref 39.0–52.0)
Hemoglobin: 13.2 g/dL (ref 13.0–17.0)

## 2020-08-16 LAB — GLUCOSE, CAPILLARY
Glucose-Capillary: 291 mg/dL — ABNORMAL HIGH (ref 70–99)
Glucose-Capillary: 327 mg/dL — ABNORMAL HIGH (ref 70–99)
Glucose-Capillary: 305 mg/dL — ABNORMAL HIGH (ref 70–99)
Glucose-Capillary: 283 mg/dL — ABNORMAL HIGH (ref 70–99)

## 2020-08-16 LAB — POTASSIUM: Potassium: 4.3 mmol/L (ref 3.5–5.1)

## 2020-08-16 MED ORDER — INSULIN GLARGINE 100 UNIT/ML ~~LOC~~ SOLN
25.0000 [IU] | Freq: Every day | SUBCUTANEOUS | Status: DC
  Administered 2020-08-17 – 2020-08-19 (×3): 25 [IU] via SUBCUTANEOUS
  Filled 2020-08-16 (×6): qty 0.25

## 2020-08-16 MED ORDER — TORSEMIDE 20 MG PO TABS: 40.0000 mg | ORAL_TABLET | Freq: Every day | ORAL | Status: DC

## 2020-08-16 MED ORDER — INSULIN ASPART 100 UNIT/ML ~~LOC~~ SOLN
4.0000 [IU] | Freq: Three times a day (TID) | SUBCUTANEOUS | Status: DC
  Administered 2020-08-16 – 2020-08-19 (×10): 4 [IU] via SUBCUTANEOUS

## 2020-08-16 MED ORDER — SODIUM ZIRCONIUM CYCLOSILICATE 10 G PO PACK: 10.0000 g | PACK | Freq: Once | ORAL | Status: DC

## 2020-08-16 MED ORDER — CARVEDILOL 12.5 MG PO TABS
12.5000 mg | ORAL_TABLET | Freq: Two times a day (BID) | ORAL | Status: DC
  Administered 2020-08-16 – 2020-08-17 (×4): 12.5 mg via ORAL
  Filled 2020-08-16 (×3): qty 1

## 2020-08-16 MED ORDER — PREDNISONE 50 MG PO TABS
60.0000 mg | ORAL_TABLET | Freq: Every day | ORAL | Status: DC
  Administered 2020-08-17 – 2020-08-20 (×4): 60 mg via ORAL
  Filled 2020-08-16 (×4): qty 1

## 2020-08-16 NOTE — Progress Notes (Signed)
CARDIAC REHAB PHASE I   PRE:  Rate/Rhythm: 67 SR    BP: lying 131/61    SaO2: 91 6L HF, EOB 85 5L  MODE:  Ambulation: 110 ft   POST:  Rate/Rhythm: 85 SR    BP: sitting 125/67     SaO2: 79 10L, 90 10L HF at rest  Pt eager to walk but is admittedly weak. Mod assist to stand from EOB due to knees. SaO2 decreased with activity. Increased O2 to 8L then 10 L during walk. SAO2 continued to decrease during walk. 79 10L at 55 ft, had pt turn around. He did st his chest felt tight but denied overt SOB. To recliner, fatigued. SaO2 slowly up on 10L HF to 92. Left on 10L HF and notified RN who will work on getting him back to 6L HF. Pt happy to walk. Encouraged IS. Pt would benefit from PT c/s due to weakness. [id number]  Harriet Masson CES, ACSM 08/16/2020 11:29 AM

## 2020-08-16 NOTE — Plan of Care (Signed)

## 2020-08-16 NOTE — Progress Notes (Addendum)
Inpatient Diabetes Program Recommendations  AACE/ADA: New Consensus Statement on Inpatient Glycemic Control (2015)  Target Ranges:  Prepandial:   less than 140 mg/dL      Peak postprandial:   less than 180 mg/dL (1-2 hours)      Critically ill patients:  140 - 180 mg/dL   Results for David Huynh, David Huynh (MRN [id number]) as of 08/16/2020 07:03  Ref. Range 08/15/2020 06:06 08/15/2020 11:26 08/15/2020 15:44 08/15/2020 21:16  Glucose-Capillary Latest Ref Range: 70 - 99 mg/dL 570 (H)  7 units NOVOLOG  322 (H)  7 units NOVOLOG  268 (H)  5 units NOVOLOG  15 units LANTUS @5 :01pm  281 (H)  3 units NOVOLOG    Results for David Huynh, David Huynh (MRN [name]) as of 08/16/2020 07:03  Ref. Range 08/16/2020 05:43  Glucose-Capillary Latest Ref Range: 70 - 99 mg/dL 10/16/2020 (H)   Results for David Huynh, KIRALY (MRN [name]) as of 08/16/2020 07:03  Ref. Range 05/25/2020 15:14  Hemoglobin A1C Latest Ref Range: 4.0 - 5.6 % 6.5 (A)    Admit with: Acute on chronic systolic CHF/ Paroxysmal atrial fibrillation/flutter with RVR  History: DM, CKD, CHF  Home DM Meds: Metformin 500 mg Daily       Glipizide 5 mg Daily       Jardiance 10 mg Daily  Current Orders: Lantus 15 units Daily      Jardiance 10 mg Daily      Novolog Sensitive Correction Scale/ SSI (0-9 units) TID AC + HS     MD- Note patient getting Solumedrol 40 mg BID.  Also note that Lantus 15 units Daily started yesterday at 5pm.  2nd dose will be on board this AM.  CBG was 291 this AM--Looks like pt is eating fairly well.  Please consider while remains on Steroids:  1. Increase Lantus to 25 units Daily (0.2 units/kg)  2. Start Novolog Meal Coverage: Novolog 4 units TID with meals Hold if pt eats <50% of meal, Hold if pt NPO    --Will follow patient during hospitalization--  07/23/2020 RN, MSN, CDE Diabetes Coordinator Inpatient Glycemic Control Team Team Pager: [phone] (8a-5p)

## 2020-08-16 NOTE — Progress Notes (Signed)
Patient ID: David Huynh, male   DOB: [birth year], 80 y.o.   MRN: [id number]     Advanced Heart Failure Rounding Note  PCP-Cardiologist: Loralie Champagne, MD   Subjective:    DCCV to NSR on 3/30.  Echo showed EF still in 50-55% range (stable) with normal RV, moderate biatrial enlargement, mild MR, moderate TR, mild AS.   CT chest with patchy consolidation consistent with PNA versus amiodarone toxicity. ESR 130.  Started on solumedrol for amiodarone toxicity. Down to 6 L HFNC.   BUN higher, remains on torsemide.  K elevated today but suspect hemolysis.   Denies orthopnea or PND. No bleeding with AC.   Objective:   Weight Range: 124.9 kg Body mass index is 37.34 kg/m.   Vital Signs:   Temp:  [97.7 F (36.5 C)-98.2 F (36.8 C)] 97.7 F (36.5 C) (04/04 0300) Pulse Rate:  [61-71] 71 (04/04 0715) Resp:  [15-20] 20 (04/04 0715) BP: (118-145)/(55-98) 122/55 (04/04 0400) SpO2:  [90 %-94 %] 91 % (04/04 0715) Weight:  [124.9 kg] 124.9 kg (04/04 0300) Last BM Date: 08/09/20  Weight change: Filed Weights   08/14/20 0320 08/15/20 0300 08/16/20 0300  Weight: 122.9 kg 124.4 kg 124.9 kg    Intake/Output:   Intake/Output Summary (Last 24 hours) at 08/16/2020 0725 Last data filed at 08/16/2020 0600 Gross per 24 hour  Intake 870 ml  Output 4400 ml  Net -3530 ml      Physical Exam   General: NAD Neck: No JVD, no thyromegaly or thyroid nodule.  Lungs: Crackles bilateral bases.  CV: Nondisplaced PMI.  Heart regular S1/S2, no S3/S4, no murmur.  No peripheral edema.   Abdomen: Soft, nontender, no hepatosplenomegaly, no distention.  Skin: Intact without lesions or rashes.  Neurologic: Alert and oriented x 3.  Psych: Normal affect. Extremities: No clubbing or cyanosis.  HEENT: Normal.    Telemetry   NSR 70-80s (personally reviewed)   Labs    CBC Recent Labs    08/14/20 0057 08/16/20 0222  WBC 7.8  --   HGB 12.9* 13.2  HCT 39.2 38.8*  MCV 94.5  --   PLT 541*  --     Basic Metabolic Panel Recent Labs    08/15/20 0104 08/16/20 0222  NA 133* 136  K 3.8 5.9*  CL 96* 97*  CO2 28 27  GLUCOSE 342* 297*  BUN 48* 59*  CREATININE 1.76* 1.74*  CALCIUM 8.7* 8.9   Liver Function Tests No results for input(s): AST, ALT, ALKPHOS, BILITOT, PROT, ALBUMIN in the last 72 hours. No results for input(s): LIPASE, AMYLASE in the last 72 hours. Cardiac Enzymes No results for input(s): CKTOTAL, CKMB, CKMBINDEX, TROPONINI in the last 72 hours.  BNP: BNP (last 3 results) Recent Labs    12/06/19 1551 08/09/20 1643  BNP 792.4* 339.4*    ProBNP (last 3 results) Recent Labs    12/17/19 1327  PROBNP 593.0*     D-Dimer No results for input(s): DDIMER in the last 72 hours. Hemoglobin A1C No results for input(s): HGBA1C in the last 72 hours. Fasting Lipid Panel No results for input(s): CHOL, HDL, LDLCALC, TRIG, CHOLHDL, LDLDIRECT in the last 72 hours. Thyroid Function Tests No results for input(s): TSH, T4TOTAL, T3FREE, THYROIDAB in the last 72 hours.  Invalid input(s): FREET3  Other results:   Imaging    No results found.   Medications:     Scheduled Medications: . apixaban  5 mg Oral BID  . atorvastatin  80 mg  Oral QHS  . calcium-vitamin D  1 tablet Oral BID  . carvedilol  6.25 mg Oral BID WC  . clopidogrel  75 mg Oral Daily  . empagliflozin  10 mg Oral Daily  . ezetimibe  10 mg Oral QHS  . icosapent Ethyl  2 g Oral BID  . insulin aspart  0-5 Units Subcutaneous QHS  . insulin aspart  0-9 Units Subcutaneous TID WC  . insulin glargine  15 Units Subcutaneous Daily  . loratadine  10 mg Oral Daily  . mouth rinse  15 mL Mouth Rinse BID  . methylPREDNISolone (SOLU-MEDROL) injection  40 mg Intravenous Q12H  . mometasone-formoterol  2 puff Inhalation BID  . pantoprazole  40 mg Oral QHS  . ranolazine  500 mg Oral BID  . sacubitril-valsartan  1 tablet Oral BID  . sulfamethoxazole-trimethoprim  1 tablet Oral Once per day on Mon Wed Fri  .  [START ON 08/17/2020] torsemide  40 mg Oral Daily    Infusions:   PRN Medications:      Assessment/Plan   1. Acute on chronic systolic CHF: Prior ischemic cardiomyopathy, improved after PCI in 2017. Echo 03/2018 with EF back down to 35-40% and moderate-severe RV dysfunction. This was in the setting of atrial fibrillation with RVR. EF 40% on TEE 04/2018.  Echo was done in 3/20, showing EF up to 50%.  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  Echo in 7/21 with EF 25-30%, severe RV dysfunction.  TEE in 10/21, however, showed EF up to 50%.  He had PCI to LAD in 10/21.  Echo 08/12/20 with EF stable 50-55%, normal RV.  He is now back in NSR.  Not volume overloaded on exam, BUN higher today.  - Hold torsemide today, restart tomorrow.   - Repeat BMET, suspect K is hemolyzed (less likely due to Bactrim).  - Continue Entresto 49/51 bid.   -Can increase Coreg back to 12.5 mg bid.  - Continue empagliflozin 10 mg daily.  2. Atrial fibrillation/Atrial flutter: As above, concerned for possible tachy-mediated CMP in past. Had successful DC-CV on 03/29/18 but then back in atrial fibrillation.Ranolazine added, but failed DCCV again 04/02/18. DCCV 04/19/18 was successful and he remained in NSR on amiodarone + ranolazine. He opted against atrial fibrillation ablation in the past (saw Dr. Rayann Heman).  He was noted to be in atrial fibrillation at last clinic visit and plan was made for DCCV if he did not convert, but he was admitted with syncope and rapid SVT in 200s.  SVT was regular, ?1:1 atrial flutter.  In atrial flutter rate 100s on amiodarone gtt => DCCV to NSR on 08/11/20. NSR today. Amiodarone stopped due to concern for amiodarone toxicity. ESR 130 - Continue ranolazine 500 mg bid.  - Increase Coreg as above.  - Continue apixaban.   - Discussed with EP (Dr. Curt Bears).  He recommends DCCV only this admission, then close followup with Dr. Rayann Heman as patient is going to need atrial  fibrillation + atrial flutter ablation.  Will need healing of lung process prior to ablation.  - If he needs anti-arrhythmic in future, Tikosyn would be option but needs amiodarone wash-out.  3. CKD: Stage 3.  BUN higher, holding torsemide today.   4. CAD: History of DES to RCA in 9/17. UnderwentLHC11/20/19 with PCI to prox left circ.LHC in 10/21 with PCI to LAD. No chest pain.  -He is on Eliquis so no ASA.   - Continue Plavix ideally x 1  yr (to 10/22).  - Continue atorvastatin and vascepa.  5. DM2: Per primary.  6. Aortic stenosis: Mild by echo this admission.   7. Mitral regurgitation: Mild by echo this admission.  8. Acute Hypoxic Respiratory Failure: CT chest with patchy consolidation c/w PNA versus amiodarone toxicity.  No fever, normal PCT and WBCs. ESR 130.  Strong suspicion for amiodarone toxicity.  - Off abx, do not think bacterial PNA.  - Amiodarone stopped and Solumedrol IV begun.  Pulmonary following.  Appreciate their input. Will need long steroid taper.  - Continue to wean oxygen, hopefully getting down to a dose he can go home on soon.  Needs to walk in hall today.  - Bactrim for PJP prophylaxis.      Length of Stay: 7  Loralie Champagne, MD  08/16/2020, 7:25 AM  Advanced Heart Failure Team Pager [phone] (M-F; 7a - 5p)  Please contact [facility] for night-coverage after hours (5p -7a ) and weekends on [email or web address]

## 2020-08-16 NOTE — Progress Notes (Signed)
NAME:  David Huynh, MRN:  [id number], DOB:  [birth year], LOS: 7 ADMISSION DATE:  08/09/2020, CONSULTATION DATE:  4/1 REFERRING MD:  Catha Gosselin, CHIEF COMPLAINT:  Acute hypoxic resp failure    History of Present Illness:  80 year old male w/ marked cardiac hx (see below) admitted 3/28 for cc 2 d h/o inc SOB, palps and LE edema. then when getting up the am of 3/28 had syncopal event. EMS called. Admitted w/ working dx of CAP and treated w. IVF and abx. Cards consulted for SVT w/ HR 200s on 3/29. PCCM was actually consulted that day we gave amio bolus and pt improved. He was later seen by cardiology who felt this was primarily tachycardia related HF and he subsequently underwent DCCV on 3/30 w/ successful conversion to NSR. On 3/31 he still had exertional dyspnea diuretics continued. Noted to desaturate at night placed on BIPAP. As of 4/1 he is -4.2 liters w/ negative BNP, sed rate of 130 and had dense bilateral patchy airspace disease on film from 3/31. This was a little worse than the initial admitting cxr. He has remained on anticoagulation, he has had fairly aggressive diuresis, on 4/1 placed back on HD and rate is better controlled. In spite of these interventions his oxygen requirements have increased from 6l high flow to 14 which was the reason for PCCM re-consultation    Pertinent  Medical History  CAD DES RCA 17) PCI to left cor and PCI to LAD *2019). , HFrEF most recently EF 59% but has had several episodes since 2917 where it dropped in 20s.  Atrial fib/flutter  W/ prior failed DCCV CKD stage 3 (cr 1.6 range) HL DM type 2 AS  MR  Significant Hospital Events: Including procedures, antibiotic start and stop dates in addition to other pertinent events   . 3/28 admitted. Felt CAP got azith and ctx. Fluid bolus.  . 3/29 SVT in 200s. Seen by ccm. Amiodarone bolus given after rate controled BP better. Later seen by cards. Felt not PNA but more likely HR related CM and acute HF got lasix and  rate control . 3/30 underwent successful DCCV to NSR . 3/31 still hypoxic. CXR worse than on admit . 4/1 abx azithro and ceftriaxone reordered. PCT negative. Sed rate 130. Up from 6 l to 14 liters since admit (ins spite of being almost 5 liters neg) so PCCM consulted.   . 4/3 improved symptoms at rest  Interim History / Subjective:  Feeling much better. Still desaturates quite a bit with ambulation.   Objective   Blood pressure (!) 118/56, pulse 70, temperature 98.2 F (36.8 C), temperature source Oral, resp. rate (!) 22, height 6' (1.829 m), weight 124.9 kg, SpO2 92 %.        Intake/Output Summary (Last 24 hours) at 08/16/2020 1133 Last data filed at 08/16/2020 0800 Gross per 24 hour  Intake 630 ml  Output 3900 ml  Net -3270 ml   Filed Weights   08/14/20 0320 08/15/20 0300 08/16/20 0300  Weight: 122.9 kg 124.4 kg 124.9 kg    Examination: General:  Elderly male in NAD sitting in bedside chair Neuro:  Alert, oriented, non-focal HEENT:  [place]/AT, No JVD noted, PERRL Cardiovascular:  RRR, no MRG Lungs:  Fine bibasilar crackles. No distress on 10L [place]  Abdomen:  Soft, non-distended, non-tender.  Musculoskeletal:  No acute deformity or ROM limitation Skin:  Intact, MMM  Labs/imaging that I havepersonally reviewed  (right click and "Reselect all SmartList Selections" daily)  CT: lower lobe predominant NSIP pattern infiltrate, small R effusion  Resolved Hospital Problem list   SVT  Assessment & Plan:  Acute hypoxic respiratory failure  Bilateral pulmonary infiltrates  Drug induced Pneumonitis vs PNA (PNA seems unlikely) Pulmonary edema  Reported h/o asthma Afib w/ RVR/SVT-->now NSR H/o CAD w/ DES to RCA and prior PCI to Circ and LAD Acute heart failure w/ h/o HFrEF (most recent on 3/30 EF 50%) Mild AS Mild MR CKD stage III DM  Pulmonary Problem list  Acute hypoxic respiratory failure in setting of bilateral pulmonary infiltrates. Lower lobe predominant.  Has not responded  to diuresis or antibiotics. PCT negative. Longstanding exposure to amio and pattern would be c/w amiodarone toxicity.  Diagnosis of exclusion but given O2 requirements reasonable to treat empirically, started on steroids 4/1. ANCA negative RF marginally elevated.  SLP eval with very mild aspiration risk somewhat driven by dyspnea.   - Continue solumedrol 40mg  BID for now. Will need long term steroid taper over 4 - 6 months. Transition to 60mg  prednisone daily while inpatient to gauge response. Wean by 10mg  every 3 weeks. If recurrence of symptoms, then go back to last successful dose and prolong taper to over one year.  - Close outpatient pulm follow up.  - Continue bactrim PJP ppx  - Start calcium/vit D to hopefully reduce osteoporosis risk (low grade evidence) - PPI QHS - Encourage IS, OOB - Wean O2 to maintain sats >90% (had been on 6 L, now on 10 after desaturating durint PT) - Diuretics as tolerated by renal function - Will need close monitoring of blood glucose while on steroids.   Best practice (right click and "Reselect all SmartList Selections" daily)  Per primary    , AGACNP-BC [facility]  See Amion for personal pager PCCM on call pager [phone] until 7pm. Please call Elink 7p-7a. [phone]  08/16/2020 11:58 AM

## 2020-08-16 NOTE — Progress Notes (Addendum)
PROGRESS NOTE    David Huynh  BTD:[id number] DOB: April 19, 1941 DOA: 08/09/2020 PCP: Bradd Canary, MD   Brief Narrative:  HPI on 08/09/2020 by Dr. Benita Gutter David Huynh is a 80 y.o. male with medical history significant for CAD, chronic systolic CHF,PAF,HTN, HLD, DM, moderate AS, CKD 3, mild persistent asthma who presents with concerns of increasing shortness of breath and syncope. About 2 days ago he began to note worsening shortness of breath with exertion and then progressively became short of breath even with just standing.  Today he got up out of bed and was getting ready to follow up with cardiology when he felt dizzy with heart palpitations and fell back onto his bed with loss of consciousness.  Wife called EMS afterwards.  Noted to be in atrial fibrillation/atrial flutter with rates of 100-120 by EMS.  CBG of 340.  Also noted to have some orthostatic changes from laying to sitting going from systolic of 140-1 08.  He has been having some increasing lower extremity and abdominal edema for past several weeks.He was last seen by cardiology on 07/22/20 and has been progressively getting increase in Torsemide since January due to increase in wight. Also had increase in his amiodarone since he was noted to be in atrial flutter during that visit. He is followed by endocrinology for his diabetes and previously had his Metformin reduced due to worsening GFR of less than 45.  Interim history Patient presented with worsening shortness of breath and syncope.  Found to have atrial fibrillation/flutter along with orthostatic changes.  Cardiology was consulted and appreciated, currently being treated for CHF exacerbation along with A. fib/a flutter.  Status post cardioversion and in sinus rhythm. Currently requiring HFNC.  Hospitalization complicated by respiratory failure.  Pulmonology consulted.  CT obtained showing likely  amiodarone toxicity, patient started on steroids. Assessment & Plan    Paroxysmal atrial fibrillation/flutter with RVR -Patient noted to have heart rates of up to 220s with no response to vagal maneuvers.  He was treated with adenosine x2 and was noted to have a drop in blood pressure. -CCM was consulted and patient was placed on IV amiodarone bolus and continued drip. -Cardiology consulted and appreciated -Status post cardioversion 08/11/20- remains in sinus rhythm today -Patient will need follow-up with electrophysiology -Continue Eliquis -Given respiratory failure, and possibility of amiodarone lung toxicity, amiodarone discontinued -Before starting other medications, patient will need an amiodarone washout  Acute combined systolic and diastolic heart failure/acute respiratory failure with hypoxia -Patient was noted to have increased shortness of breath and was hypoxic in the 80s and required supplemental oxygen.  Does not use oxygen at home. -Prior to admission, torsemide was increased to 60 mg twice daily by cardiology. -Echocardiogram shows an EF of 50 to 55%, LV demonstrates regional wall motion abnormalities.  LV diastolic parameters indeterminate. -Chest x-ray on admission showed bilateral pleural effusions -Continue monitor intake and output, daily weights -Was placed on placed on IV Lasix 80 mg twice daily with metolazone however currently held today.  Cardiology will possibly transition to oral diuretics on 08/17/2020 -Continue Entresto (Cardiology increasing dose), Coreg, Jardiance -Continue supplemental oxygen, and wean as possible-  Was requiring 14L of HFNC, now down to 8L HFNC, will continue to wean  -Discussed sleep study with the patient, he was adamant about not wanting to have one done and completely refuses -Pulmonology consulted and appreciated -Will order home desaturation eval  Patchy consolidation, likely amiodarone toxicity -CXR on 08/12/2020 concerning for pneumonia however  procalcitonin unremarkable 0.14 -Currently  afebrile -patient with leukocytosis however this may be reactive -Patient was started on azithromycin and ceftriaxone however given that patient is afebrile and procalcitonin has been unremarkable, antibiotics were discontinued by pulmonology -CT chest showed patchy and confluent areas of consolidation throughout both lungs most prominent in the bases appearance suggest pneumonia.  Amiodarone toxicity would be a possibility as well. -Patient was started on IV Solu-Medrol - will need a long steroid taper as per pulmonology - pending further recommendations -patient was also started on Bactrim for PJP prophylaxis   Syncope with loss of consciousness -Likely secondary to the above -Echocardiogram is unable to visualize mitral valve.  Moderate tricuspid valve regurgitation.  Mild aortic valve stenosis.  Aortic dilatation measuring 40 mm.  Leukocytosis/thrombocytosis -Leukocytosis is resolved today  -suspect reactive, no signs or source of infection at this time  Chronic kidney disease, stage IIIb -Creatinine appears to be stable, currently 1.74 -continue to monitor BMP  Anemia of chronic disease -Hemoglobin appears to be stable, continue monitor CBC  History of CAD -With drug-eluting stent -Currently no chest -Continue Coreg, Plavix, Eliquis, Ranexa  Diabetes mellitus, type II -Hemoglobin A1c was 6.5 approximately 2 months ago -Continue insulin sliding scale CBG monitoring   History of mild persistent asthma -Continue bronchodilators as needed and incentive spirometer -Currently no wheezing on exam  Hyperkalemia -Suspect sample was hemolyzed -Potassium currently 5.9, pending repeat lab draw -If needed will place on low,  DVT Prophylaxis Eliquis  Code Status: Full  Family Communication: None at bedside  Disposition Plan:  Status is: Inpatient  Remains inpatient appropriate because:IV treatments appropriate due to intensity of illness or inability to take PO   Dispo: The  patient is from: Home              Anticipated d/c is to: Home              Patient currently is not medically stable to d/c.   Difficult to place patient No   Consultants PCCM Cardiology  Procedures  Echocardiogram Cardioversion  Antibiotics   Anti-infectives (From admission, onward)   Start     Dose/Rate Route Frequency Ordered Stop   08/16/20 0900  sulfamethoxazole-trimethoprim (BACTRIM DS) 800-160 MG per tablet 1 tablet        1 tablet Oral Once per day on Mon Wed Fri 08/14/20 0923     08/13/20 0800  azithromycin (ZITHROMAX) 500 mg in sodium chloride 0.9 % 250 mL IVPB  Status:  Discontinued        500 mg 250 mL/hr over 60 Minutes Intravenous Every 24 hours 08/13/20 0720 08/14/20 0922   08/13/20 0800  cefTRIAXone (ROCEPHIN) 1 g in sodium chloride 0.9 % 100 mL IVPB  Status:  Discontinued        1 g 200 mL/hr over 30 Minutes Intravenous Every 24 hours 08/13/20 0720 08/14/20 0922   08/09/20 2015  cefTRIAXone (ROCEPHIN) 1 g in sodium chloride 0.9 % 100 mL IVPB        1 g 200 mL/hr over 30 Minutes Intravenous  Once 08/09/20 2004 08/09/20 2112   08/09/20 2015  azithromycin (ZITHROMAX) 500 mg in sodium chloride 0.9 % 250 mL IVPB        500 mg 250 mL/hr over 60 Minutes Intravenous  Once 08/09/20 2004 08/09/20 2135      Subjective:   David Huynh seen and examined today.   Patient with no complaints today.  Hoping to continue to wean his oxygen so  he can go home.  Denies current chest pain.  Feels breathing has improved.  Denies current abdominal pain, nausea or vomiting, diarrhea or constipation, dizziness or headache.   Objective:   Vitals:   08/16/20 0715 08/16/20 0735 08/16/20 0748 08/16/20 0800  BP:  119/64  (!) 118/56  Pulse: 71 71  70  Resp: 20 16  (!) 22  Temp:  98.2 F (36.8 C)    TempSrc:  Oral    SpO2: 91% 92% 94% 92%  Weight:      Height:        Intake/Output Summary (Last 24 hours) at 08/16/2020 1007 Last data filed at 08/16/2020 0800 Gross per 24 hour   Intake 870 ml  Output 3900 ml  Net -3030 ml   Filed Weights   08/14/20 0320 08/15/20 0300 08/16/20 0300  Weight: 122.9 kg 124.4 kg 124.9 kg   Exam  General: Well developed, chronically ill-appearing, NAD  HEENT: NCAT, mucous membranes moist.   Cardiovascular: S1 S2 auscultated, RRR, 2/6 SEM  Respiratory: Diminished breath sounds (anteriorly)  Abdomen: Soft, obese, nontender, nondistended, + bowel sounds  Extremities: warm dry without cyanosis clubbing.  LE edema improving  Neuro: AAOx3, nonfocal  Psych: pleasant, appropriate mood and affect  Data Reviewed: I have personally reviewed following labs and imaging studies  CBC: Recent Labs  Lab 08/09/20 1643 08/10/20 0541 08/11/20 0139 08/12/20 0102 08/14/20 0057 08/16/20 0222  WBC 10.7* 13.3* 12.6* 13.4* 7.8  --   NEUTROABS 8.6*  --  9.4*  --   --   --   HGB 11.5* 11.8* 11.6* 11.7* 12.9* 13.2  HCT 35.0* 35.4* 35.2* 36.1* 39.2 38.8*  MCV 95.6 95.7 95.4 97.6 94.5  --   PLT 394 458* 477* 496* 541*  --    Basic Metabolic Panel: Recent Labs  Lab 08/11/20 0139 08/12/20 0102 08/13/20 0022 08/14/20 0057 08/15/20 0104 08/16/20 0222  NA 140 140 139 137 133* 136  K 3.4* 3.8 3.7 4.0 3.8 5.9*  CL 105 106 100 98 96* 97*  CO2 25 25 29 28 28 27   GLUCOSE 132* 130* 146* 202* 342* 297*  BUN 19 21 23  32* 48* 59*  CREATININE 1.63* 1.68* 1.62* 1.62* 1.76* 1.74*  CALCIUM 8.2* 8.3* 8.7* 8.9 8.7* 8.9  MG 2.5* 2.6*  --   --   --   --    GFR: Estimated Creatinine Clearance: 47 mL/min (A) (by C-G formula based on SCr of 1.74 mg/dL (H)). Liver Function Tests: Recent Labs  Lab 08/10/20 1300 08/11/20 0139  AST 23 28  ALT 21 25  ALKPHOS 64 65  BILITOT 0.8 0.8  PROT 6.9 6.5  ALBUMIN 2.7* 2.6*   No results for input(s): LIPASE, AMYLASE in the last 168 hours. No results for input(s): AMMONIA in the last 168 hours. Coagulation Profile: No results for input(s): INR, PROTIME in the last 168 hours. Cardiac Enzymes: No results  for input(s): CKTOTAL, CKMB, CKMBINDEX, TROPONINI in the last 168 hours. BNP (last 3 results) Recent Labs    12/17/19 1327  PROBNP 593.0*   HbA1C: No results for input(s): HGBA1C in the last 72 hours. CBG: Recent Labs  Lab 08/15/20 0606 08/15/20 1126 08/15/20 1544 08/15/20 2116 08/16/20 0543  GLUCAP 333* 322* 268* 281* 291*   Lipid Profile: No results for input(s): CHOL, HDL, LDLCALC, TRIG, CHOLHDL, LDLDIRECT in the last 72 hours. Thyroid Function Tests: No results for input(s): TSH, T4TOTAL, FREET4, T3FREE, THYROIDAB in the last 72 hours. Anemia Panel: No  results for input(s): VITAMINB12, FOLATE, FERRITIN, TIBC, IRON, RETICCTPCT in the last 72 hours. Urine analysis:    Component Value Date/Time   COLORURINE STRAW (A) 03/25/2018 1627   APPEARANCEUR CLEAR 03/25/2018 1627   LABSPEC >1.030 (H) 03/25/2018 1627   PHURINE 5.5 03/25/2018 1627   GLUCOSEU 100 (A) 03/25/2018 1627   HGBUR TRACE (A) 03/25/2018 1627   BILIRUBINUR NEGATIVE 03/25/2018 1627   KETONESUR NEGATIVE 03/25/2018 1627   PROTEINUR 100 (A) 03/25/2018 1627   NITRITE NEGATIVE 03/25/2018 1627   LEUKOCYTESUR NEGATIVE 03/25/2018 1627   Sepsis Labs: @LABRCNTIP (procalcitonin:4,lacticidven:4)  ) Recent Results (from the past 240 hour(s))  Resp Panel by RT-PCR (Flu A&B, Covid) Nasopharyngeal Swab     Status: None   Collection Time: 08/09/20  5:09 PM   Specimen: Nasopharyngeal Swab; Nasopharyngeal(NP) swabs in vial transport medium  Result Value Ref Range Status   SARS Coronavirus 2 by RT PCR NEGATIVE NEGATIVE Final    Comment: (NOTE) SARS-CoV-2 target nucleic acids are NOT DETECTED.  The SARS-CoV-2 RNA is generally detectable in upper respiratory specimens during the acute phase of infection. The lowest concentration of SARS-CoV-2 viral copies this assay can detect is 138 copies/mL. A negative result does not preclude SARS-Cov-2 infection and should not be used as the sole basis for treatment or other patient  management decisions. A negative result may occur with  improper specimen collection/handling, submission of specimen other than nasopharyngeal swab, presence of viral mutation(s) within the areas targeted by this assay, and inadequate number of viral copies(<138 copies/mL). A negative result must be combined with clinical observations, patient history, and epidemiological information. The expected result is Negative.  Fact Sheet for Patients:  BloggerCourse.com  Fact Sheet for Healthcare Providers:  SeriousBroker.it  This test is no t yet approved or cleared by the [place] FDA and  has been authorized for detection and/or diagnosis of SARS-CoV-2 by FDA under an Emergency Use Authorization (EUA). This EUA will remain  in effect (meaning this test can be used) for the duration of the COVID-19 declaration under Section 564(b)(1) of the Act, 21 U.S.C.section 360bbb-3(b)(1), unless the authorization is terminated  or revoked sooner.       Influenza A by PCR NEGATIVE NEGATIVE Final   Influenza B by PCR NEGATIVE NEGATIVE Final    Comment: (NOTE) The Xpert Xpress SARS-CoV-2/FLU/RSV plus assay is intended as an aid in the diagnosis of influenza from Nasopharyngeal swab specimens and should not be used as a sole basis for treatment. Nasal washings and aspirates are unacceptable for Xpert Xpress SARS-CoV-2/FLU/RSV testing.  Fact Sheet for Patients: BloggerCourse.com  Fact Sheet for Healthcare Providers: SeriousBroker.it  This test is not yet approved or cleared by the [place] FDA and has been authorized for detection and/or diagnosis of SARS-CoV-2 by FDA under an Emergency Use Authorization (EUA). This EUA will remain in effect (meaning this test can be used) for the duration of the COVID-19 declaration under Section 564(b)(1) of the Act, 21 U.S.C. section 360bbb-3(b)(1),  unless the authorization is terminated or revoked.  Performed at [facility], [address], [place], [place] [address]   Culture, blood (routine x 2)     Status: None   Collection Time: 08/09/20  8:15 PM   Specimen: BLOOD  Result Value Ref Range Status   Specimen Description BLOOD LEFT ANTECUBITAL  Final   Special Requests   Final    BOTTLES DRAWN AEROBIC AND ANAEROBIC Blood Culture adequate volume   Culture   Final  NO GROWTH 5 DAYS Performed at [facility], [address] [place], [place] [address]    Report Status 08/14/2020 FINAL  Final  Culture, blood (routine x 2)     Status: None   Collection Time: 08/09/20  8:20 PM   Specimen: BLOOD  Result Value Ref Range Status   Specimen Description BLOOD RIGHT ANTECUBITAL  Final   Special Requests   Final    BOTTLES DRAWN AEROBIC AND ANAEROBIC Blood Culture adequate volume   Culture   Final    NO GROWTH 5 DAYS Performed at [facility], [address], [place], [place] [address]    Report Status 08/14/2020 FINAL  Final  MRSA PCR Screening     Status: None   Collection Time: 08/11/20  1:08 AM   Specimen: Nasal Mucosa; Nasopharyngeal  Result Value Ref Range Status   MRSA by PCR NEGATIVE NEGATIVE Final    Comment:        The GeneXpert MRSA Assay (FDA approved for NASAL specimens only), is one component of a comprehensive MRSA colonization surveillance program. It is not intended to diagnose MRSA infection nor to guide or monitor treatment for MRSA infections. Performed at [facility], [address] [place], [place] [address]       Radiology Studies: No results found.   Scheduled Meds: . apixaban  5 mg Oral BID  . atorvastatin  80 mg Oral QHS  . calcium-vitamin D  1 tablet Oral BID  . carvedilol  12.5 mg Oral BID WC  . clopidogrel  75 mg Oral Daily  . empagliflozin  10 mg Oral Daily  . ezetimibe  10 mg Oral QHS  . icosapent Ethyl  2 g Oral BID  . insulin aspart  0-5 Units  Subcutaneous QHS  . insulin aspart  0-9 Units Subcutaneous TID WC  . insulin glargine  15 Units Subcutaneous Daily  . loratadine  10 mg Oral Daily  . mouth rinse  15 mL Mouth Rinse BID  . methylPREDNISolone (SOLU-MEDROL) injection  40 mg Intravenous Q12H  . mometasone-formoterol  2 puff Inhalation BID  . pantoprazole  40 mg Oral QHS  . ranolazine  500 mg Oral BID  . sacubitril-valsartan  1 tablet Oral BID  . sulfamethoxazole-trimethoprim  1 tablet Oral Once per day on Mon Wed Fri  . [START ON 08/17/2020] torsemide  40 mg Oral Daily   Continuous Infusions:    LOS: 7 days   Time Spent in minutes   45 minutes  David Huynh D.O. on 08/16/2020 at 10:07 AM  Between 7am to 7pm - Please see pager noted on [email or web address]  After 7pm go to [email or web address]  And look for the night coverage person covering for me after hours  [facility] Office  [phone]

## 2020-08-17 DIAGNOSIS — J984 Other disorders of lung: Secondary | ICD-10-CM

## 2020-08-17 DIAGNOSIS — I5043 Acute on chronic combined systolic (congestive) and diastolic (congestive) heart failure: Secondary | ICD-10-CM | POA: Diagnosis not present

## 2020-08-17 DIAGNOSIS — J9601 Acute respiratory failure with hypoxia: Secondary | ICD-10-CM | POA: Diagnosis not present

## 2020-08-17 DIAGNOSIS — R55 Syncope and collapse: Secondary | ICD-10-CM | POA: Diagnosis not present

## 2020-08-17 DIAGNOSIS — I5021 Acute systolic (congestive) heart failure: Secondary | ICD-10-CM | POA: Diagnosis not present

## 2020-08-17 DIAGNOSIS — T462X5A Adverse effect of other antidysrhythmic drugs, initial encounter: Secondary | ICD-10-CM

## 2020-08-17 DIAGNOSIS — I2510 Atherosclerotic heart disease of native coronary artery without angina pectoris: Secondary | ICD-10-CM | POA: Diagnosis not present

## 2020-08-17 DIAGNOSIS — I4811 Longstanding persistent atrial fibrillation: Secondary | ICD-10-CM | POA: Diagnosis not present

## 2020-08-17 LAB — GLUCOSE, CAPILLARY
Glucose-Capillary: 270 mg/dL — ABNORMAL HIGH (ref 70–99)
Glucose-Capillary: 277 mg/dL — ABNORMAL HIGH (ref 70–99)
Glucose-Capillary: 395 mg/dL — ABNORMAL HIGH (ref 70–99)
Glucose-Capillary: 333 mg/dL — ABNORMAL HIGH (ref 70–99)

## 2020-08-17 LAB — BASIC METABOLIC PANEL
Anion gap: 8 (ref 5–15)
Anion gap: 11 (ref 5–15)
BUN: 62 mg/dL — ABNORMAL HIGH (ref 8–23)
BUN: 63 mg/dL — ABNORMAL HIGH (ref 8–23)
CO2: 29 mmol/L (ref 22–32)
CO2: 27 mmol/L (ref 22–32)
Calcium: 8.9 mg/dL (ref 8.9–10.3)
Calcium: 9 mg/dL (ref 8.9–10.3)
Chloride: 98 mmol/L (ref 98–111)
Chloride: 96 mmol/L — ABNORMAL LOW (ref 98–111)
Creatinine, Ser: 1.77 mg/dL — ABNORMAL HIGH (ref 0.61–1.24)
Creatinine, Ser: 1.85 mg/dL — ABNORMAL HIGH (ref 0.61–1.24)
GFR, Estimated: 39 mL/min — ABNORMAL LOW (ref >60)
GFR, Estimated: 37 mL/min — ABNORMAL LOW (ref >60)
Glucose, Bld: 261 mg/dL — ABNORMAL HIGH (ref 70–99)
Glucose, Bld: 320 mg/dL — ABNORMAL HIGH (ref 70–99)
Potassium: 4.4 mmol/L (ref 3.5–5.1)
Potassium: 4.2 mmol/L (ref 3.5–5.1)
Sodium: 135 mmol/L (ref 135–145)
Sodium: 134 mmol/L — ABNORMAL LOW (ref 135–145)

## 2020-08-17 LAB — MAGNESIUM: Magnesium: 3 mg/dL — ABNORMAL HIGH (ref 1.7–2.4)

## 2020-08-17 MED ORDER — TORSEMIDE 20 MG PO TABS
40.0000 mg | ORAL_TABLET | Freq: Every day | ORAL | Status: DC
  Administered 2020-08-18 – 2020-08-19 (×2): 40 mg via ORAL
  Filled 2020-08-17 (×2): qty 2

## 2020-08-17 NOTE — Progress Notes (Addendum)
Patient ID: David Huynh, male   DOB: [birth year], 80 y.o.   MRN: [id number]     Advanced Heart Failure Rounding Note  PCP-Cardiologist: Loralie Champagne, MD   Subjective:    DCCV to NSR on 3/30.  Echo showed EF still in 50-55% range (stable) with normal RV, moderate biatrial enlargement, mild MR, moderate TR, mild AS.   CT chest with patchy consolidation consistent with PNA versus amiodarone toxicity. ESR 130.  Creatinine 1.77 with BUN 62.   Started on solumedrol for amiodarone toxicity. Down to 6 L HFNC at rest and requiring 10 liters with ambulation.   Feeling better today.   Objective:   Weight Range: 121.2 kg Body mass index is 36.24 kg/m.   Vital Signs:   Temp:  [97.6 F (36.4 C)-98.7 F (37.1 C)] 97.9 F (36.6 C) (04/05 0525) Pulse Rate:  [63-70] 66 (04/05 0525) Resp:  [17-25] 20 (04/05 0525) BP: (116-131)/(56-70) 118/67 (04/05 0525) SpO2:  [90 %-94 %] 93 % (04/05 0525) Weight:  [121.2 kg] 121.2 kg (04/05 0525) Last BM Date: 08/14/20  Weight change: Filed Weights   08/15/20 0300 08/16/20 0300 08/17/20 0525  Weight: 124.4 kg 124.9 kg 121.2 kg    Intake/Output:   Intake/Output Summary (Last 24 hours) at 08/17/2020 0736 Last data filed at 08/17/2020 0618 Gross per 24 hour  Intake 1440 ml  Output 4200 ml  Net -2760 ml      Physical Exam   General:  Sitting in the chair. No resp difficulty HEENT: normal Neck: supple. no JVD. Carotids 2+ bilat; no bruits. No lymphadenopathy or thryomegaly appreciated. Cor: PMI nondisplaced. Regular rate & rhythm. No rubs, gallops or murmurs. Lungs: Crackles in the bases on 6lHFNC Abdomen: soft, nontender, nondistended. No hepatosplenomegaly. No bruits or masses. Good bowel sounds. Extremities: no cyanosis, clubbing, rash, edema Neuro: alert & orientedx3, cranial nerves grossly intact. moves all 4 extremities w/o difficulty. Affect pleasant .    Telemetry   NSR 70-80s    Labs    CBC Recent Labs    08/16/20 0222   HGB 13.2  HCT 79.8*   Basic Metabolic Panel Recent Labs    08/16/20 0222 08/16/20 1058 08/17/20 0026  NA 136  --  135  K 5.9* 4.3 4.4  CL 97*  --  98  CO2 27  --  29  GLUCOSE 297*  --  261*  BUN 59*  --  62*  CREATININE 1.74*  --  1.77*  CALCIUM 8.9  --  8.9   Liver Function Tests No results for input(s): AST, ALT, ALKPHOS, BILITOT, PROT, ALBUMIN in the last 72 hours. No results for input(s): LIPASE, AMYLASE in the last 72 hours. Cardiac Enzymes No results for input(s): CKTOTAL, CKMB, CKMBINDEX, TROPONINI in the last 72 hours.  BNP: BNP (last 3 results) Recent Labs    12/06/19 1551 08/09/20 1643  BNP 792.4* 339.4*    ProBNP (last 3 results) Recent Labs    12/17/19 1327  PROBNP 593.0*     D-Dimer No results for input(s): DDIMER in the last 72 hours. Hemoglobin A1C No results for input(s): HGBA1C in the last 72 hours. Fasting Lipid Panel No results for input(s): CHOL, HDL, LDLCALC, TRIG, CHOLHDL, LDLDIRECT in the last 72 hours. Thyroid Function Tests No results for input(s): TSH, T4TOTAL, T3FREE, THYROIDAB in the last 72 hours.  Invalid input(s): FREET3  Other results:   Imaging    No results found.   Medications:     Scheduled Medications: . apixaban  5 mg Oral BID  . atorvastatin  80 mg Oral QHS  . calcium-vitamin D  1 tablet Oral BID  . carvedilol  12.5 mg Oral BID WC  . clopidogrel  75 mg Oral Daily  . empagliflozin  10 mg Oral Daily  . ezetimibe  10 mg Oral QHS  . icosapent Ethyl  2 g Oral BID  . insulin aspart  0-5 Units Subcutaneous QHS  . insulin aspart  0-9 Units Subcutaneous TID WC  . insulin aspart  4 Units Subcutaneous TID WC  . insulin glargine  25 Units Subcutaneous Daily  . loratadine  10 mg Oral Daily  . mouth rinse  15 mL Mouth Rinse BID  . mometasone-formoterol  2 puff Inhalation BID  . pantoprazole  40 mg Oral QHS  . predniSONE  60 mg Oral Q breakfast  . ranolazine  500 mg Oral BID  . sacubitril-valsartan  1 tablet  Oral BID  . sulfamethoxazole-trimethoprim  1 tablet Oral Once per day on Mon Wed Fri  . torsemide  40 mg Oral Daily    Infusions:   PRN Medications:      Assessment/Plan   1. Acute on chronic systolic CHF: Prior ischemic cardiomyopathy, improved after PCI in 2017. Echo 03/2018 with EF back down to 35-40% and moderate-severe RV dysfunction. This was in the setting of atrial fibrillation with RVR. EF 40% on TEE 04/2018.  Echo was done in 3/20, showing EF up to 50%.  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  Echo in 7/21 with EF 25-30%, severe RV dysfunction.  TEE in 10/21, however, showed EF up to 50%.  He had PCI to LAD in 10/21.  Echo 08/12/20 with EF stable 50-55%, normal RV.  He is now back in NSR.   - Volume status stable. Hold torsemide today - Continue Entresto 49/51 bid.   -Continue Coreg back to 12.5 mg bid.  - Continue empagliflozin 10 mg daily.  - Renal function. 2. Atrial fibrillation/Atrial flutter: As above, concerned for possible tachy-mediated CMP in past. Had successful DC-CV on 03/29/18 but then back in atrial fibrillation.Ranolazine added, but failed DCCV again 04/02/18. DCCV 04/19/18 was successful and he remained in NSR on amiodarone + ranolazine. He opted against atrial fibrillation ablation in the past (saw Dr. Rayann Heman).  He was noted to be in atrial fibrillation at last clinic visit and plan was made for DCCV if he did not convert, but he was admitted with syncope and rapid SVT in 200s.  SVT was regular, ?1:1 atrial flutter.  In atrial flutter rate 100s on amiodarone gtt => DCCV to NSR on 08/11/20. NSR today. Amiodarone stopped due to concern for amiodarone toxicity. ESR 130 - Continue ranolazine 500 mg bid.  - Continue Coreg as above.  - Continue apixaban.   - Discussed with EP (Dr. Curt Bears).  He recommends DCCV only this admission, then close followup with Dr. Rayann Heman as patient is going to need atrial fibrillation + atrial flutter  ablation.  Will need healing of lung process prior to ablation.  - If he needs anti-arrhythmic in future, Tikosyn would be option but needs amiodarone wash-out.  3. CKD: Stage 3.  BUN higher, holding torsemide today.   4. CAD: History of DES to RCA in 9/17. UnderwentLHC11/20/19 with PCI to prox left circ.LHC in 10/21 with PCI to LAD. No chest pain. -He is on Eliquis so no ASA.   - Continue Plavix ideally x 1 yr (to 10/22).  -  Continue atorvastatin and vascepa.  5. DM2: Per primary.  6. Aortic stenosis: Mild by echo this admission.   7. Mitral regurgitation: Mild by echo this admission.  8. Acute Hypoxic Respiratory Failure: CT chest with patchy consolidation c/w PNA versus amiodarone toxicity.  No fever, normal PCT and WBCs. ESR 130.  Strong suspicion for amiodarone toxicity.  - Off abx, do not think bacterial PNA.  - HFNC down to 6 liters today.  - Amiodarone stopped and Solumedrol IV begun. Transitioned to prednisone.  Pulmonary following.  Appreciate their input. Will need long steroid taper.  -  Bactrim for PJP prophylaxis.  9. Deconditioning  -Consult PT/OT  -Walked 50 feet.    Length of Stay: [place], NP  08/17/2020, 7:36 AM  Advanced Heart Failure Team Pager [phone] (M-F; 7a - 5p)  Please contact [facility] for night-coverage after hours (5p -7a ) and weekends on [email or web address]  Patient seen with NP, agree with the above note.   Walked, got short of breath by end of walk.  Still with desaturation with walking and still requiring 6-8 L HFNC.  He remains in NSR.   General: NAD Neck: No JVD, no thyromegaly or thyroid nodule.  Lungs: Mild crackles at bases.  CV: Nondisplaced PMI.  Heart regular S1/S2, no S3/S4, no murmur.  No peripheral edema.   Abdomen: Soft, nontender, no hepatosplenomegaly, no distention.  Skin: Intact without lesions or rashes.  Neurologic: Alert and oriented x 3.  Psych: Normal affect. Extremities: No clubbing or cyanosis.  HEENT: Normal.    Continue incentive spirometry and regular walks.  Prednisone for amiodarone lung toxicity to taper to 60 mg daily today, decrease by 10 mg every 3 wks.   Volume status ok, BUN high.  Can hold torsemide today.   Loralie Champagne 08/17/2020 8:13 AM

## 2020-08-17 NOTE — Evaluation (Signed)
Physical Therapy Evaluation Patient Details Name: David Huynh MRN: [id number] DOB: 1940/08/31 Today's Date: 08/17/2020   History of Present Illness  80 yo admitted 3/28 with SOb, bil LE edema, and syncope. 3/29 SVT, 3/30 DCCV. Pt diuresed for CHF and also with Rt pleural effusion and respiratory failure. PMhx: CAD s/p PCI, Afib, CHF, CKD, DM, HLD, HTN  Clinical Impression  Pt pleasant and willing to walk with pt at 91% on 6L at rest but with transition to EOB drop to 88% and bump to 8 then 10L with standing and maintained 10L with gait and return to 6L at rest. Pt with decline within 50' of gait on 10L to 84% and even with standing rest only limited recovery. Max cues for energy conservation with pt with poor compliance for not talking and walking. Pt recently moved into ILF at [facility] [place] with wife and reports she can assist at home as well as staff. Pt with decreased activity tolerance, gait and mobility who will benefit from acute therapy to maximize mobility, safety and function. Pt encouraged to be OOB for all meals, continue HEP and continue to work on IS (able to perform x 5 to 2000cc).      Follow Up Recommendations Home health PT    Equipment Recommendations  Rolling walker with 5" wheels;3in1 (PT)    Recommendations for Other Services OT consult     Precautions / Restrictions Precautions Precautions: Fall Precaution Comments: watch sats      Mobility  Bed Mobility Overal bed mobility: Modified Independent             General bed mobility comments: HOb 25 degrees with rail    Transfers Overall transfer level: Needs assistance   Transfers: Sit to/from Stand Sit to Stand: Supervision         General transfer comment: cues for hand placement with lack of control for descent  Ambulation/Gait Ambulation/Gait assistance: Min guard Gait Distance (Feet): 100 Feet Assistive device: Rolling walker (2 wheeled) Gait Pattern/deviations: Step-through  pattern;Decreased stride length   Gait velocity interpretation: <1.8 ft/sec, indicate of risk for recurrent falls General Gait Details: cues for posture, proximity to RW, breathing technique and energy conservation. Pt on 10L once standing due to drop in SpO2 with sats gradually declining from 91>85% with 50' of gait on 10L. Pt has a hard time not talking and walking. 1 min standing rest at 50' with sats only able to recover to 87% and return to room with maintained 84-85% on 10L.  Stairs            Wheelchair Mobility    Modified Rankin (Stroke Patients Only)       Balance Overall balance assessment: Needs assistance Sitting-balance support: No upper extremity supported;Feet supported Sitting balance-Leahy Scale: Fair Sitting balance - Comments: EOb without assist   Standing balance support: Bilateral upper extremity supported Standing balance-Leahy Scale: Poor Standing balance comment: bil UE support on RW                             Pertinent Vitals/Pain Pain Assessment: No/denies pain    Home Living Family/patient expects to be discharged to:: Private residence Living Arrangements: Spouse/significant other Available Help at Discharge: Available 24 hours/day Type of Home: House Home Access: Level entry     Home Layout: One level Home Equipment: None Additional Comments: just moved into ILF at [facility] [place]    Prior Function Level of Independence: Independent  Hand Dominance        Extremity/Trunk Assessment   Upper Extremity Assessment Upper Extremity Assessment: Overall WFL for tasks assessed    Lower Extremity Assessment Lower Extremity Assessment: Generalized weakness    Cervical / Trunk Assessment Cervical / Trunk Assessment: Other exceptions Cervical / Trunk Exceptions: rounded shoulders, forward head  Communication   Communication: No difficulties  Cognition Arousal/Alertness: Awake/alert Behavior During  Therapy: WFL for tasks assessed/performed Overall Cognitive Status: Within Functional Limits for tasks assessed                                        General Comments      Exercises General Exercises - Lower Extremity Long Arc Quad: AROM;Both;Seated;15 reps Hip Flexion/Marching: AROM;Both;15 reps;Seated   Assessment/Plan    PT Assessment Patient needs continued PT services  PT Problem List Decreased mobility;Decreased activity tolerance;Decreased balance;Decreased knowledge of use of DME;Cardiopulmonary status limiting activity       PT Treatment Interventions Gait training;Functional mobility training;Therapeutic activities;Patient/family education;DME instruction;Therapeutic exercise    PT Goals (Current goals can be found in the Care Plan section)  Acute Rehab PT Goals Patient Stated Goal: be able to return home. pt reports he was running 1 mile a day PT Goal Formulation: With patient Time For Goal Achievement: 08/31/20 Potential to Achieve Goals: Fair    Frequency Min 3X/week   Barriers to discharge        Co-evaluation               AM-PAC PT "6 Clicks" Mobility  Outcome Measure Help needed turning from your back to your side while in a flat bed without using bedrails?: A Little Help needed moving from lying on your back to sitting on the side of a flat bed without using bedrails?: A Little Help needed moving to and from a bed to a chair (including a wheelchair)?: A Little Help needed standing up from a chair using your arms (e.g., wheelchair or bedside chair)?: A Little Help needed to walk in hospital room?: A Little Help needed climbing 3-5 steps with a railing? : A Lot 6 Click Score: 17    End of Session Equipment Utilized During Treatment: Gait belt;Oxygen Activity Tolerance: Patient tolerated treatment well Patient left: in chair;with call bell/phone within reach Nurse Communication: Mobility status PT Visit Diagnosis: Other  abnormalities of gait and mobility (R26.89);Difficulty in walking, not elsewhere classified (R26.2);Muscle weakness (generalized) (M62.81)    Time: 5956-3875 PT Time Calculation (min) (ACUTE ONLY): 28 min   Charges:   PT Evaluation $PT Eval Moderate Complexity: 1 Mod PT Treatments $Gait Training: 8-22 mins        Yadir Zentner P, PT Acute Rehabilitation Services Pager: [phone] Office: [phone]   Enedina Finner Llesenia Fogal 08/17/2020, 9:51 AM

## 2020-08-17 NOTE — Evaluation (Signed)
Occupational Therapy Evaluation Patient Details Name: David Huynh MRN: [id number] DOB: August 26, 1940 Today's Date: 08/17/2020    History of Present Illness 80 yo admitted 3/28 with SOb, bil LE edema, and syncope. 3/29 SVT, 3/30 DCCV. Pt diuresed for CHF and also with Rt pleural effusion and respiratory failure. PMhx: CAD s/p PCI, Afib, CHF, CKD, DM, HLD, HTN   Clinical Impression   Pt in bed upon arrival, pleasant and agreeable to with OT. Pt lives with his wife at an ILF and was Ind with ADLs/selfcare and mobility, was driving. Pt at 91% on 6L at rest but with transition to EOB drop to 84% with ADL mobility to bathroom and sink and increased O2 8L then back to 6L at rest once seated back on EOB. Reviewed energy conservation techniques and pursed lip breathing with pt, however pt required mod verbal  cues for energy conservation. Pt's wife can assist him with selfcare prn at home. All education completed and no further acute OT services indicated at this time. OT will sign off    Follow Up Recommendations  No OT follow up;Supervision - Intermittent    Equipment Recommendations  3 in 1 bedside commode;Tub/shower seat;Other (comment) (RW)    Recommendations for Other Services       Precautions / Restrictions Precautions Precautions: Fall Precaution Comments: watch sats Restrictions Weight Bearing Restrictions: No      Mobility Bed Mobility Overal bed mobility: Modified Independent                  Transfers Overall transfer level: Needs assistance   Transfers: Sit to/from Stand Sit to Stand: Supervision              Balance Overall balance assessment: Needs assistance Sitting-balance support: No upper extremity supported;Feet supported Sitting balance-Leahy Scale: Good     Standing balance support: Bilateral upper extremity supported;During functional activity Standing balance-Leahy Scale: Poor                             ADL either performed  or assessed with clinical judgement   ADL Overall ADL's : Modified independent                                       General ADL Comments: sup - Mod I. Pt instructed on energy conservation techniques, pursed lip breathing     Vision Baseline Vision/History: Wears glasses Patient Visual Report: No change from baseline       Perception     Praxis      Pertinent Vitals/Pain Pain Assessment: No/denies pain     Hand Dominance Right   Extremity/Trunk Assessment Upper Extremity Assessment Upper Extremity Assessment: Overall WFL for tasks assessed   Lower Extremity Assessment Lower Extremity Assessment: Defer to PT evaluation   Cervical / Trunk Assessment Cervical / Trunk Assessment: Normal   Communication Communication Communication: No difficulties   Cognition Arousal/Alertness: Awake/alert Behavior During Therapy: WFL for tasks assessed/performed Overall Cognitive Status: Within Functional Limits for tasks assessed                                     General Comments       Exercises     Shoulder Instructions      Home Living Family/patient expects to be  discharged to:: Private residence Living Arrangements: Spouse/significant other Available Help at Discharge: Available 24 hours/day Type of Home: House Home Access: Level entry     Home Layout: One level     Bathroom Shower/Tub: Producer, television/film/video: Standard     Home Equipment: None   Additional Comments: just moved into ILF at [facility] [place]      Prior Functioning/Environment Level of Independence: Independent                 OT Problem List: Decreased activity tolerance;Decreased knowledge of use of DME or AE      OT Treatment/Interventions:      OT Goals(Current goals can be found in the care plan section) Acute Rehab OT Goals Patient Stated Goal: go home  OT Frequency:     Barriers to D/C:            Co-evaluation               AM-PAC OT "6 Clicks" Daily Activity     Outcome Measure Help from another person eating meals?: None Help from another person taking care of personal grooming?: None Help from another person toileting, which includes using toliet, bedpan, or urinal?: None Help from another person bathing (including washing, rinsing, drying)?: None Help from another person to put on and taking off regular upper body clothing?: None Help from another person to put on and taking off regular lower body clothing?: A Little 6 Click Score: 23   End of Session    Activity Tolerance: Patient tolerated treatment well Patient left: in bed  OT Visit Diagnosis: Muscle weakness (generalized) (M62.81);Other abnormalities of gait and mobility (R26.89)                Time: 7425-9563 OT Time Calculation (min): 19 min Charges:  OT General Charges $OT Visit: 1 Visit OT Evaluation $OT Eval Moderate Complexity: 1 Mod    Galen Manila 08/17/2020, 1:50 PM

## 2020-08-17 NOTE — Progress Notes (Addendum)
NAME:  David Huynh, MRN:  [id number], DOB:  Jun 07, 1940, LOS: 8 ADMISSION DATE:  08/09/2020, CONSULTATION DATE:  4/1 REFERRING MD:  Catha Gosselin, CHIEF COMPLAINT:  Acute hypoxic resp failure    brief  80 year old male w/ marked cardiac hx (see below) admitted 3/28 for cc 2 d h/o inc SOB, palps and LE edema. then when getting up the am of 3/28 had syncopal event. EMS called. Admitted w/ working dx of CAP and treated w. IVF and abx. Cards consulted for SVT w/ HR 200s on 3/29. PCCM was actually consulted that day we gave amio bolus and pt improved. He was later seen by cardiology who felt this was primarily tachycardia related HF and he subsequently underwent DCCV on 3/30 w/ successful conversion to NSR. On 3/31 he still had exertional dyspnea diuretics continued. Noted to desaturate at night placed on BIPAP. As of 4/1 he is -4.2 liters w/ negative BNP, sed rate of 130 and had dense bilateral patchy airspace disease on film from 3/31. This was a little worse than the initial admitting cxr. He has remained on anticoagulation, he has had fairly aggressive diuresis, on 4/1 placed back on HD and rate is better controlled. In spite of these interventions his oxygen requirements have increased from 6l high flow to 14 which was the reason for PCCM re-consultation    Pertinent  Medical History  CAD DES RCA 17) PCI to left cor and PCI to LAD *2019). , HFrEF most recently EF 59% but has had several episodes since 2917 where it dropped in 20s.  Atrial fib/flutter  W/ prior failed DCCV CKD stage 3 (cr 1.6 range) HL DM type 2 AS  MR  Significant Hospital Events: Including procedures, antibiotic start and stop dates in addition to other pertinent events   . 3/28 admitted. Felt CAP got azith and ctx. Fluid bolus.  . 3/29 SVT in 200s. Seen by ccm. Amiodarone bolus given after rate controled BP better. Later seen by cards. Felt not PNA but more likely HR related CM and acute HF got lasix and rate control . 3/30  underwent successful DCCV to NSR . 3/31 still hypoxic. CXR worse than on admit . 4/1 abx azithro and ceftriaxone reordered. PCT negative. Sed rate 130. Up from 6 l to 14 liters since admit (ins spite of being almost 5 liters neg) so PCCM consulted.   . 4/3 improved symptoms at rest . 4/4 - improved dyspnea., 6-8L o2 HFNC . Still desats with exertion. On Solumedrol  Interim History / Subjective:    08/17/20 - Per cards improved with solumedrol for amio tox. Down to 6L HFNC at rest and 10L with exertion. Creat 1.77/BUN 62. Cards holding torsemide today. Off solumedrol., Now on prednisone 60mg  daily - decrease by 10mg  every  3 weeks is plan .  Refuseds cPAP and biPAP. Says he is doing better and pulling more volume with IS  Objective   Blood pressure (!) 121/57, pulse 78, temperature (!) 97.5 F (36.4 C), temperature source Oral, resp. rate 16, height 6' (1.829 m), weight 121.2 kg, SpO2 (!) 89 %.        Intake/Output Summary (Last 24 hours) at 08/17/2020 0832 Last data filed at 08/17/2020 0618 Gross per 24 hour  Intake 1200 ml  Output 4200 ml  Net -3000 ml   Filed Weights   08/15/20 0300 08/16/20 0300 08/17/20 0525  Weight: 124.4 kg 124.9 kg 121.2 kg    Examination: General Appearance:  Looks well. Sitting  in chair. 6L HFNC -> pulse ox 89% at rest Head:  Normocephalic, without obvious abnormality, atraumatic Eyes:  PERRL - yes, conjunctiva/corneas - mudd     Ears:  Normal external ear canals, both ears Nose:  G tube - o2 on Throat:  ETT TUBE - no , OG tube - no Neck:  Supple,  No enlargement/tenderness/nodules Lungs: Clear to auscultation bilaterally anteriorl Heart:  S1 and S2 normal, no murmur, CVP - no.  Pressors - no Abdomen:  Soft, no masses, no organomegaly Genitalia / Rectal:  Not done Extremities:  Extremities- intact Skin:  ntact in exposed areas . Sacral area - not examined Neurologic:  Sedation - none -> RASS - +1 . Moves all 4s - yes. CAM-ICU - neg . Orientation -  x3+      Labs/imaging that I havepersonally reviewed  (right click and "Reselect all SmartList Selections" daily)  CT: lower lobe predominant NSIP pattern infiltrate, small R effusion  Resolved Hospital Problem list   SVT  Assessment & Plan:  Acute hypoxic respiratory failure  Bilateral pulmonary infiltrates  Drug induced Pneumonitis vs PNA (PNA seems unlikely) Pulmonary edema  Reported h/o asthma Afib w/ RVR/SVT-->now NSR H/o CAD w/ DES to RCA and prior PCI to Circ and LAD Acute heart failure w/ h/o HFrEF (most recent on 3/30 EF 50%) Mild AS Mild MR CKD stage III DM  Pulmonary Problem list  Acute hypoxic respiratory failure in setting of bilateral pulmonary infiltrates. Lower lobe predominant.  Has not responded to diuresis or antibiotics. PCT negative. Longstanding exposure to amio and pattern would be c/w amiodarone toxicity.    ANCA negative RF marginally elevated.  SLP eval with very mild aspiration risk somewhat driven by dyspne  Diagnosis of exclusion but given O2 requirements reasonable to treat empirically, started on steroids 4/1.   08/17/2020 - now at 6L [place] at rest. Feels better. Refuses cPAP and biPAP QHS  Plan - Transition to 60mg  prednisone daily while inpatient to gauge response. Wean by 10mg  every 3 weeks. If recurrence of symptoms, then go back to last successful dose and prolong taper to over one year.  - Close outpatient pulm follow up.  - Continue bactrim PJP ppx  - will check G6PD - Start calcium/vit D to hopefully reduce osteoporosis risk (low grade evidence) - PPI QHS - Encourage IS, OOB - Wean O2 to maintain sats >90% (had been on 6 L, now on 10 after desaturating durint PT) - Diuretics as tolerated by renal function - Will need close monitoring of blood glucose while on steroids.  - check cxr 08/18/20   Best practice (right click and "Reselect all SmartList Selections" daily)  Per primary   Patient updated at bedside    SIGNATURE     Dr. , M.D., F.C.C.P,  Pulmonary and Critical Care Medicine Staff Physician, [facility] Center Director - Interstitial Lung Disease  Program  Pulmonary Fibrosis [facility] at [facility] [place], [facility], [place]  Pager: [phone], If no answer  OR between  19:00-7:00h: page [phone] Telephone (clinical office): [phone] Telephone (research): [phone]  8:37 AM 08/17/2020

## 2020-08-17 NOTE — Progress Notes (Signed)
CARDIAC REHAB PHASE I   PRE:  Rate/Rhythm: 63 SR    BP: sitting 124/67    SaO2: 93 6L  MODE:  Ambulation: 110 ft   POST:  Rate/Rhythm: 80 SR    BP: sitting 131/67     SaO2: 85 10L, 90 6L  Pt ambulated with RW and 10L. SaO2 down to 85 10L after walk. Sts less SOB than yesterday. Return to bed.  [id number]   Harriet Masson CES, ACSM 08/17/2020 3:16 PM

## 2020-08-17 NOTE — Progress Notes (Signed)
PROGRESS NOTE    David Huynh  OJJ:[id number] DOB: 08/24/1940 DOA: 08/09/2020 PCP: Bradd Canary, MD   Brief Narrative:  HPI on 08/09/2020 by Dr. Benita Gutter David Huynh is a 80 y.o. male with medical history significant for CAD, chronic systolic CHF,PAF,HTN, HLD, DM, moderate AS, CKD 3, mild persistent asthma who presents with concerns of increasing shortness of breath and syncope. About 2 days ago he began to note worsening shortness of breath with exertion and then progressively became short of breath even with just standing.  Today he got up out of bed and was getting ready to follow up with cardiology when he felt dizzy with heart palpitations and fell back onto his bed with loss of consciousness.  Wife called EMS afterwards.  Noted to be in atrial fibrillation/atrial flutter with rates of 100-120 by EMS.  CBG of 340.  Also noted to have some orthostatic changes from laying to sitting going from systolic of 140-1 08.  He has been having some increasing lower extremity and abdominal edema for past several weeks.He was last seen by cardiology on 07/22/20 and has been progressively getting increase in Torsemide since January due to increase in wight. Also had increase in his amiodarone since he was noted to be in atrial flutter during that visit. He is followed by endocrinology for his diabetes and previously had his Metformin reduced due to worsening GFR of less than 45.  Interim history Patient presented with worsening shortness of breath and syncope.  Found to have atrial fibrillation/flutter along with orthostatic changes.  Cardiology was consulted and appreciated, currently being treated for CHF exacerbation along with A. fib/a flutter.  Status post cardioversion and in sinus rhythm. Currently requiring HFNC.  Hospitalization complicated by respiratory failure.  Pulmonology consulted.  CT obtained showing likely  amiodarone toxicity, patient started on steroids. Assessment & Plan    Paroxysmal atrial fibrillation/flutter with RVR -Patient noted to have heart rates of up to 220s with no response to vagal maneuvers.  He was treated with adenosine x2 and was noted to have a drop in blood pressure. -CCM was consulted and patient was placed on IV amiodarone bolus and continued drip. -Cardiology consulted and appreciated -Status post cardioversion 08/11/20- remains in sinus rhythm today -Patient will need follow-up with electrophysiology -Continue Eliquis -Given respiratory failure, and possibility of amiodarone lung toxicity, amiodarone discontinued -Before starting other medications, patient will need an amiodarone washout  Acute combined systolic and diastolic heart failure/acute respiratory failure with hypoxia -Patient was noted to have increased shortness of breath and was hypoxic in the 80s and required supplemental oxygen.  Does not use oxygen at home. -Prior to admission, torsemide was increased to 60 mg twice daily by cardiology. -Echocardiogram shows an EF of 50 to 55%, LV demonstrates regional wall motion abnormalities.  LV diastolic parameters indeterminate. -Chest x-ray on admission showed bilateral pleural effusions -Continue monitor intake and output, daily weights -Was placed on placed on IV Lasix 80 mg twice daily with metolazone however currently held today.  Cardiology will possibly transition to oral diuretics on 08/17/2020 -Continue Entresto (Cardiology increasing dose), Coreg, Jardiance -Continue supplemental oxygen, and wean as possible-  Was requiring 14L of HFNC, now down to 6L HFNC, will continue to wean  -Discussed sleep study with the patient, he was adamant about not wanting to have one done and completely refuses -Pulmonology consulted and appreciated -Patient was able to work with physical therapy, at rest he maintained saturations in the 90s on 6  L, however with ambulation patient needed 10 L  Patchy consolidation, likely amiodarone  toxicity -CXR on 08/12/2020 concerning for pneumonia however procalcitonin unremarkable 0.14 -Currently afebrile -patient with leukocytosis however this may be reactive -Patient was started on azithromycin and ceftriaxone however given that patient is afebrile and procalcitonin has been unremarkable, antibiotics were discontinued by pulmonology -CT chest showed patchy and confluent areas of consolidation throughout both lungs most prominent in the bases appearance suggest pneumonia.  Amiodarone toxicity would be a possibility as well. -Patient was started on IV Solu-Medrol - will need a long steroid taper as per pulmonology - recommended transitioning patient to prednisone 60 mg daily while inpatient to gauge response and to wean by 10 mg every 3 weeks -patient was also started on Bactrim for PJP prophylaxis   Syncope with loss of consciousness -Likely secondary to the above -Echocardiogram is unable to visualize mitral valve.  Moderate tricuspid valve regurgitation.  Mild aortic valve stenosis.  Aortic dilatation measuring 40 mm.  Leukocytosis/thrombocytosis -Leukocytosis is resolved today  -suspect reactive, no signs or source of infection at this time  Chronic kidney disease, stage IIIb -Creatinine appears to be stable, currently 1.85 -continue to monitor BMP  Anemia of chronic disease -Hemoglobin appears to be stable, continue monitor CBC  History of CAD -With drug-eluting stent -Currently no chest -Continue Coreg, Plavix, Eliquis, Ranexa  Diabetes mellitus, type II -Hemoglobin A1c was 6.5 approximately 2 months ago -Continue insulin sliding scale CBG monitoring   History of mild persistent asthma -Continue bronchodilators as needed and incentive spirometer -Currently no wheezing on exam  DVT Prophylaxis Eliquis  Code Status: Full  Family Communication: None at bedside  Disposition Plan:  Status is: Inpatient  Remains inpatient appropriate because:IV treatments  appropriate due to intensity of illness or inability to take PO   Dispo: The patient is from: Home              Anticipated d/c is to: Home              Patient currently is not medically stable to d/c.   Difficult to place patient No   Consultants PCCM Cardiology  Procedures  Echocardiogram Cardioversion  Antibiotics   Anti-infectives (From admission, onward)   Start     Dose/Rate Route Frequency Ordered Stop   08/16/20 0900  sulfamethoxazole-trimethoprim (BACTRIM DS) 800-160 MG per tablet 1 tablet        1 tablet Oral Once per day on Mon Wed Fri 08/14/20 0923     08/13/20 0800  azithromycin (ZITHROMAX) 500 mg in sodium chloride 0.9 % 250 mL IVPB  Status:  Discontinued        500 mg 250 mL/hr over 60 Minutes Intravenous Every 24 hours 08/13/20 0720 08/14/20 0922   08/13/20 0800  cefTRIAXone (ROCEPHIN) 1 g in sodium chloride 0.9 % 100 mL IVPB  Status:  Discontinued        1 g 200 mL/hr over 30 Minutes Intravenous Every 24 hours 08/13/20 0720 08/14/20 0922   08/09/20 2015  cefTRIAXone (ROCEPHIN) 1 g in sodium chloride 0.9 % 100 mL IVPB        1 g 200 mL/hr over 30 Minutes Intravenous  Once 08/09/20 2004 08/09/20 2112   08/09/20 2015  azithromycin (ZITHROMAX) 500 mg in sodium chloride 0.9 % 250 mL IVPB        500 mg 250 mL/hr over 60 Minutes Intravenous  Once 08/09/20 2004 08/09/20 2135      Subjective:   David Most  Lisette Huynh seen and examined today.  Patient states he is feeling better today.  Denies current chest pain, abdominal pain, nausea or vomiting, diarrhea constipation, dizziness or headache.  Feels breathing has continued to improve.    Objective:   Vitals:   08/17/20 0747 08/17/20 0800 08/17/20 0849 08/17/20 1139  BP:  (!) 121/57  131/82  Pulse:  78  67  Resp:  16  16  Temp:  (!) 97.5 F (36.4 C)  (!) 97.5 F (36.4 C)  TempSrc:  Oral  Oral  SpO2: (!) 86% (!) 89% 90% 95%  Weight:      Height:        Intake/Output Summary (Last 24 hours) at 08/17/2020  1224 Last data filed at 08/17/2020 1219 Gross per 24 hour  Intake 960 ml  Output 4300 ml  Net -3340 ml   Filed Weights   08/15/20 0300 08/16/20 0300 08/17/20 0525  Weight: 124.4 kg 124.9 kg 121.2 kg   Exam  General: Well developed, chronically ill-appearing, NAD  HEENT: NCAT, mucous membranes moist.   Cardiovascular: S1 S2 auscultated, RRR, soft SEM  Respiratory: Diminished breath sounds (anteriorly)  Abdomen: Soft, obese, nontender, nondistended, + bowel sounds  Extremities: warm dry without cyanosis clubbing.  LE edema improving  Neuro: AAOx3, nonfocal  Psych:Appropriate mood and affect  Data Reviewed: I have personally reviewed following labs and imaging studies  CBC: Recent Labs  Lab 08/11/20 0139 08/12/20 0102 08/14/20 0057 08/16/20 0222  WBC 12.6* 13.4* 7.8  --   NEUTROABS 9.4*  --   --   --   HGB 11.6* 11.7* 12.9* 13.2  HCT 35.2* 36.1* 39.2 38.8*  MCV 95.4 97.6 94.5  --   PLT 477* 496* 541*  --    Basic Metabolic Panel: Recent Labs  Lab 08/11/20 0139 08/12/20 0102 08/13/20 0022 08/14/20 0057 08/15/20 0104 08/16/20 0222 08/16/20 1058 08/17/20 0026 08/17/20 0835  NA 140 140   < > 137 133* 136  --  135 134*  K 3.4* 3.8   < > 4.0 3.8 5.9* 4.3 4.4 4.2  CL 105 106   < > 98 96* 97*  --  98 96*  CO2 25 25   < > 28 28 27   --  29 27  GLUCOSE 132* 130*   < > 202* 342* 297*  --  261* 320*  BUN 19 21   < > 32* 48* 59*  --  62* 63*  CREATININE 1.63* 1.68*   < > 1.62* 1.76* 1.74*  --  1.77* 1.85*  CALCIUM 8.2* 8.3*   < > 8.9 8.7* 8.9  --  8.9 9.0  MG 2.5* 2.6*  --   --   --   --   --   --  3.0*   < > = values in this interval not displayed.   GFR: Estimated Creatinine Clearance: 43.5 mL/min (A) (by C-G formula based on SCr of 1.85 mg/dL (H)). Liver Function Tests: Recent Labs  Lab 08/10/20 1300 08/11/20 0139  AST 23 28  ALT 21 25  ALKPHOS 64 65  BILITOT 0.8 0.8  PROT 6.9 6.5  ALBUMIN 2.7* 2.6*   No results for input(s): LIPASE, AMYLASE in the  last 168 hours. No results for input(s): AMMONIA in the last 168 hours. Coagulation Profile: No results for input(s): INR, PROTIME in the last 168 hours. Cardiac Enzymes: No results for input(s): CKTOTAL, CKMB, CKMBINDEX, TROPONINI in the last 168 hours. BNP (last 3 results) Recent Labs  12/17/19 1327  PROBNP 593.0*   HbA1C: No results for input(s): HGBA1C in the last 72 hours. CBG: Recent Labs  Lab 08/16/20 1121 08/16/20 1615 08/16/20 2128 08/17/20 0615 08/17/20 1137  GLUCAP 327* 305* 283* 270* 277*   Lipid Profile: No results for input(s): CHOL, HDL, LDLCALC, TRIG, CHOLHDL, LDLDIRECT in the last 72 hours. Thyroid Function Tests: No results for input(s): TSH, T4TOTAL, FREET4, T3FREE, THYROIDAB in the last 72 hours. Anemia Panel: No results for input(s): VITAMINB12, FOLATE, FERRITIN, TIBC, IRON, RETICCTPCT in the last 72 hours. Urine analysis:    Component Value Date/Time   COLORURINE STRAW (A) 03/25/2018 1627   APPEARANCEUR CLEAR 03/25/2018 1627   LABSPEC >1.030 (H) 03/25/2018 1627   PHURINE 5.5 03/25/2018 1627   GLUCOSEU 100 (A) 03/25/2018 1627   HGBUR TRACE (A) 03/25/2018 1627   BILIRUBINUR NEGATIVE 03/25/2018 1627   KETONESUR NEGATIVE 03/25/2018 1627   PROTEINUR 100 (A) 03/25/2018 1627   NITRITE NEGATIVE 03/25/2018 1627   LEUKOCYTESUR NEGATIVE 03/25/2018 1627   Sepsis Labs: @LABRCNTIP (procalcitonin:4,lacticidven:4)  ) Recent Results (from the past 240 hour(s))  Resp Panel by RT-PCR (Flu A&B, Covid) Nasopharyngeal Swab     Status: None   Collection Time: 08/09/20  5:09 PM   Specimen: Nasopharyngeal Swab; Nasopharyngeal(NP) swabs in vial transport medium  Result Value Ref Range Status   SARS Coronavirus 2 by RT PCR NEGATIVE NEGATIVE Final    Comment: (NOTE) SARS-CoV-2 target nucleic acids are NOT DETECTED.  The SARS-CoV-2 RNA is generally detectable in upper respiratory specimens during the acute phase of infection. The lowest concentration of  SARS-CoV-2 viral copies this assay can detect is 138 copies/mL. A negative result does not preclude SARS-Cov-2 infection and should not be used as the sole basis for treatment or other patient management decisions. A negative result may occur with  improper specimen collection/handling, submission of specimen other than nasopharyngeal swab, presence of viral mutation(s) within the areas targeted by this assay, and inadequate number of viral copies(<138 copies/mL). A negative result must be combined with clinical observations, patient history, and epidemiological information. The expected result is Negative.  Fact Sheet for Patients:  08/11/20  Fact Sheet for Healthcare Providers:  BloggerCourse.com  This test is no t yet approved or cleared by the [employer]ious[employer] FDA and  has been authorized for detection and/or diagnosis of SARS-CoV-2 by FDA under an Emergency Use Authorization (EUA). This EUA will remain  in effect (meaning this test can be used) for the duration of the COVID-19 declaration under Section 564(b)(1) of the Act, 21 U.S.C.section 360bbb-3(b)(1), unless the authorization is terminated  or revoked sooner.       Influenza A by PCR NEGATIVE NEGATIVE Final   Influenza B by PCR NEGATIVE NEGATIVE Final    Comment: (NOTE) The Xpert Xpress SARS-CoV-2/FLU/RSV plus assay is intended as an aid in the diagnosis of influenza from Nasopharyngeal swab specimens and should not be used as a sole basis for treatment. Nasal washings and aspirates are unacceptable for Xpert Xpress SARS-CoV-2/FLU/RSV testing.  Fact Sheet for Patients: [place]  Fact Sheet for Healthcare Providers: BloggerCourse.com  This test is not yet approved or cleared by the [employer]ious[employer]ker.it FDA and has been authorized for detection and/or diagnosis of SARS-CoV-2 by FDA under an Emergency Use  Authorization (EUA). This EUA will remain in effect (meaning this test can be used) for the duration of the COVID-19 declaration under Section 564(b)(1) of the Act, 21 U.S.C. section 360bbb-3(b)(1), unless the authorization is terminated or revoked.  Performed  at [facility], [address], [place], [place] [address]   Culture, blood (routine x 2)     Status: None   Collection Time: 08/09/20  8:15 PM   Specimen: BLOOD  Result Value Ref Range Status   Specimen Description BLOOD LEFT ANTECUBITAL  Final   Special Requests   Final    BOTTLES DRAWN AEROBIC AND ANAEROBIC Blood Culture adequate volume   Culture   Final    NO GROWTH 5 DAYS Performed at [facility], [address], [place], [place] [address]    Report Status 08/14/2020 FINAL  Final  Culture, blood (routine x 2)     Status: None   Collection Time: 08/09/20  8:20 PM   Specimen: BLOOD  Result Value Ref Range Status   Specimen Description BLOOD RIGHT ANTECUBITAL  Final   Special Requests   Final    BOTTLES DRAWN AEROBIC AND ANAEROBIC Blood Culture adequate volume   Culture   Final    NO GROWTH 5 DAYS Performed at [facility], [address], [place], [place] [address]    Report Status 08/14/2020 FINAL  Final  MRSA PCR Screening     Status: None   Collection Time: 08/11/20  1:08 AM   Specimen: Nasal Mucosa; Nasopharyngeal  Result Value Ref Range Status   MRSA by PCR NEGATIVE NEGATIVE Final    Comment:        The GeneXpert MRSA Assay (FDA approved for NASAL specimens only), is one component of a comprehensive MRSA colonization surveillance program. It is not intended to diagnose MRSA infection nor to guide or monitor treatment for MRSA infections. Performed at [facility], [address], [place], [place] [address]       Radiology Studies: No results found.   Scheduled Meds: . apixaban  5 mg Oral BID  . atorvastatin  80 mg Oral QHS  . calcium-vitamin D  1 tablet Oral BID  .  carvedilol  12.5 mg Oral BID WC  . clopidogrel  75 mg Oral Daily  . empagliflozin  10 mg Oral Daily  . ezetimibe  10 mg Oral QHS  . icosapent Ethyl  2 g Oral BID  . insulin aspart  0-5 Units Subcutaneous QHS  . insulin aspart  0-9 Units Subcutaneous TID WC  . insulin aspart  4 Units Subcutaneous TID WC  . insulin glargine  25 Units Subcutaneous Daily  . loratadine  10 mg Oral Daily  . mouth rinse  15 mL Mouth Rinse BID  . mometasone-formoterol  2 puff Inhalation BID  . pantoprazole  40 mg Oral QHS  . predniSONE  60 mg Oral Q breakfast  . ranolazine  500 mg Oral BID  . sacubitril-valsartan  1 tablet Oral BID  . sulfamethoxazole-trimethoprim  1 tablet Oral Once per day on Mon Wed Fri  . [START ON 08/18/2020] torsemide  40 mg Oral Daily   Continuous Infusions:    LOS: 8 days   Time Spent in minutes   30 minutes  Skyelynn Rambeau D.O. on 08/17/2020 at 12:24 PM  Between 7am to 7pm - Please see pager noted on [email or web address]  After 7pm go to [email or web address]  And look for the night coverage person covering for me after hours  [facility] Office  [phone]

## 2020-08-18 ENCOUNTER — Inpatient Hospital Stay (HOSPITAL_COMMUNITY): Payer: Medicare Other

## 2020-08-18 DIAGNOSIS — J9601 Acute respiratory failure with hypoxia: Secondary | ICD-10-CM | POA: Diagnosis not present

## 2020-08-18 DIAGNOSIS — R55 Syncope and collapse: Secondary | ICD-10-CM | POA: Diagnosis not present

## 2020-08-18 DIAGNOSIS — I5043 Acute on chronic combined systolic (congestive) and diastolic (congestive) heart failure: Secondary | ICD-10-CM | POA: Diagnosis not present

## 2020-08-18 DIAGNOSIS — I5030 Unspecified diastolic (congestive) heart failure: Secondary | ICD-10-CM

## 2020-08-18 DIAGNOSIS — I5033 Acute on chronic diastolic (congestive) heart failure: Secondary | ICD-10-CM

## 2020-08-18 DIAGNOSIS — T462X5A Adverse effect of other antidysrhythmic drugs, initial encounter: Secondary | ICD-10-CM | POA: Diagnosis not present

## 2020-08-18 DIAGNOSIS — N179 Acute kidney failure, unspecified: Secondary | ICD-10-CM | POA: Diagnosis not present

## 2020-08-18 DIAGNOSIS — J984 Other disorders of lung: Secondary | ICD-10-CM | POA: Diagnosis not present

## 2020-08-18 LAB — CBC
HCT: 39 % (ref 39.0–52.0)
Hemoglobin: 13 g/dL (ref 13.0–17.0)
MCH: 30.7 pg (ref 26.0–34.0)
MCHC: 33.3 g/dL (ref 30.0–36.0)
MCV: 92 fL (ref 80.0–100.0)
Platelets: 541 10*3/uL — ABNORMAL HIGH (ref 150–400)
RBC: 4.24 MIL/uL (ref 4.22–5.81)
RDW: 12.1 % (ref 11.5–15.5)
WBC: 14.5 10*3/uL — ABNORMAL HIGH (ref 4.0–10.5)
nRBC: 0 % (ref 0.0–0.2)

## 2020-08-18 LAB — BASIC METABOLIC PANEL
Anion gap: 8 (ref 5–15)
BUN: 58 mg/dL — ABNORMAL HIGH (ref 8–23)
CO2: 27 mmol/L (ref 22–32)
Calcium: 9 mg/dL (ref 8.9–10.3)
Chloride: 99 mmol/L (ref 98–111)
Creatinine, Ser: 1.69 mg/dL — ABNORMAL HIGH (ref 0.61–1.24)
GFR, Estimated: 41 mL/min — ABNORMAL LOW (ref >60)
Glucose, Bld: 257 mg/dL — ABNORMAL HIGH (ref 70–99)
Potassium: 4.1 mmol/L (ref 3.5–5.1)
Sodium: 134 mmol/L — ABNORMAL LOW (ref 135–145)

## 2020-08-18 LAB — GLUCOSE, CAPILLARY
Glucose-Capillary: 210 mg/dL — ABNORMAL HIGH (ref 70–99)
Glucose-Capillary: 272 mg/dL — ABNORMAL HIGH (ref 70–99)
Glucose-Capillary: 256 mg/dL — ABNORMAL HIGH (ref 70–99)
Glucose-Capillary: 251 mg/dL — ABNORMAL HIGH (ref 70–99)

## 2020-08-18 LAB — MAGNESIUM: Magnesium: 3 mg/dL — ABNORMAL HIGH (ref 1.7–2.4)

## 2020-08-18 MED ORDER — CARVEDILOL 6.25 MG PO TABS
18.7500 mg | ORAL_TABLET | Freq: Two times a day (BID) | ORAL | Status: DC
  Administered 2020-08-18 – 2020-08-20 (×4): 18.75 mg via ORAL
  Filled 2020-08-18 (×5): qty 1

## 2020-08-18 NOTE — Progress Notes (Signed)
NAME:  David Huynh, MRN:  [id number], DOB:  10-23-1940, LOS: 9 ADMISSION DATE:  08/09/2020, CONSULTATION DATE:  4/1 REFERRING MD:  Catha Gosselin, CHIEF COMPLAINT:  Acute hypoxic resp failure    brief  80 year old male w/ marked cardiac hx (see below) admitted 3/28 for cc 2 d h/o inc SOB, palps and LE edema. then when getting up the am of 3/28 had syncopal event. EMS called. Admitted w/ working dx of CAP and treated w. IVF and abx. Cards consulted for SVT w/ HR 200s on 3/29. PCCM was actually consulted that day we gave amio bolus and pt improved. He was later seen by cardiology who felt this was primarily tachycardia related HF and he subsequently underwent DCCV on 3/30 w/ successful conversion to NSR. On 3/31 he still had exertional dyspnea diuretics continued. Noted to desaturate at night placed on BIPAP. As of 4/1 he is -4.2 liters w/ negative BNP, sed rate of 130 and had dense bilateral patchy airspace disease on film from 3/31. This was a little worse than the initial admitting cxr. He has remained on anticoagulation, he has had fairly aggressive diuresis, on 4/1 placed back on HD and rate is better controlled. In spite of these interventions his oxygen requirements have increased from 6l high flow to 14 which was the reason for PCCM re-consultation    Pertinent  Medical History  CAD DES RCA 17) PCI to left cor and PCI to LAD *2019). , HFrEF most recently EF 59% but has had several episodes since 2917 where it dropped in 20s.  Atrial fib/flutter  W/ prior failed DCCV CKD stage 3 (cr 1.6 range) HL DM type 2 AS  MR  Significant Hospital Events: Including procedures, antibiotic start and stop dates in addition to other pertinent events   . 3/28 admitted. Felt CAP got azith and ctx. Fluid bolus.  . 3/29 SVT in 200s. Seen by ccm. Amiodarone bolus given after rate controled BP better. Later seen by cards. Felt not PNA but more likely HR related CM and acute HF got lasix and rate control . 3/30  underwent successful DCCV to NSR . 3/31 still hypoxic. CXR worse than on admit . 4/1 abx azithro and ceftriaxone reordered. PCT negative. Sed rate 130. Up from 6 l to 14 liters since admit (ins spite of being almost 5 liters neg) so PCCM consulted.   . 4/3 improved symptoms at rest . 4/4 - improved dyspnea., 6-8L o2 HFNC . Still desats with exertion. On Solumedrol.  . 08/17/20 - Per cards improved with solumedrol for amio tox. Down to 6L HFNC at rest and 10L with exertion. Creat 1.77/BUN 62. Cards holding torsemide today. Off solumedrol., Now on prednisone 60mg  daily - decrease by 10mg  every  3 weeks is plan .Refuseds cPAP and biPAP. Says he is doing better and pulling more volume with IS  Interim History / Subjective:    4/6 -on prednisone 60 mg.  Down to 4 L nasal cannula at rest 93%.  Desaturated yesterday to 85% on 10 L nasal cannula walking 100 feet.  Today's walk test is pending.  But he is feeling better.  Cardiology suspect he can go home.CXR some better    Objective   Blood pressure 119/65, pulse 64, temperature 97.8 F (36.6 C), temperature source Oral, resp. rate 18, height 6' (1.829 m), weight 120.9 kg, SpO2 91 %.        Intake/Output Summary (Last 24 hours) at 08/18/2020 1055 Last data filed at  08/18/2020 7628 Gross per 24 hour  Intake 480 ml  Output 2350 ml  Net -1870 ml   Filed Weights   08/16/20 0300 08/17/20 0525 08/18/20 0418  Weight: 124.9 kg 121.2 kg 120.9 kg    Examination: General Appearance:  Looks better. Obese. 4L [place] - 93% a rest Head:  Normocephalic, without obvious abnormality, atraumatic Eyes:  PERRL - yes, conjunctiva/corneas - muddy     Ears:  Normal external ear canals, both ears Nose:  G tube - no but has [place] Throat:  ETT TUBE - no , OG tube - no Neck:  Supple,  No enlargement/tenderness/nodules Lungs: Clear to auscultation bilaterally,  Heart:  S1 and S2 normal, no murmur, CVP - no.  Pressors - no Abdomen:  Soft, no masses, no  organomegaly Genitalia / Rectal:  Not done Extremities:  Extremities- intact Skin:  ntact in exposed areas . Sacral area - x Neurologic:  Sedation - none -> RASS - +1 . Moves all 4s - yes. CAM-ICU - neg . Orientation - x3+          Labs/imaging that I havepersonally reviewed  (right click and "Reselect all SmartList Selections" daily)    LABS    PULMONARY No results for input(s): PHART, PCO2ART, PO2ART, HCO3, TCO2, O2SAT in the last 168 hours.  Invalid input(s): PCO2, PO2  CBC Recent Labs  Lab 08/12/20 0102 08/14/20 0057 08/16/20 0222 08/18/20 0032  HGB 11.7* 12.9* 13.2 13.0  HCT 36.1* 39.2 38.8* 39.0  WBC 13.4* 7.8  --  14.5*  PLT 496* 541*  --  541*    COAGULATION No results for input(s): INR in the last 168 hours.  CARDIAC  No results for input(s): TROPONINI in the last 168 hours. No results for input(s): PROBNP in the last 168 hours.   CHEMISTRY Recent Labs  Lab 08/12/20 0102 08/13/20 0022 08/15/20 0104 08/16/20 0222 08/16/20 1058 08/17/20 0026 08/17/20 0835 08/18/20 0032  NA 140   < > 133* 136  --  135 134* 134*  K 3.8   < > 3.8 5.9*   < > 4.4 4.2 4.1  CL 106   < > 96* 97*  --  98 96* 99  CO2 25   < > 28 27  --  29 27 27   GLUCOSE 130*   < > 342* 297*  --  261* 320* 257*  BUN 21   < > 48* 59*  --  62* 63* 58*  CREATININE 1.68*   < > 1.76* 1.74*  --  1.77* 1.85* 1.69*  CALCIUM 8.3*   < > 8.7* 8.9  --  8.9 9.0 9.0  MG 2.6*  --   --   --   --   --  3.0* 3.0*   < > = values in this interval not displayed.   Estimated Creatinine Clearance: 47.6 mL/min (A) (by C-G formula based on SCr of 1.69 mg/dL (H)).   LIVER No results for input(s): AST, ALT, ALKPHOS, BILITOT, PROT, ALBUMIN, INR in the last 168 hours.   INFECTIOUS Recent Labs  Lab 08/13/20 0022  PROCALCITON 0.14     ENDOCRINE CBG (last 3)  Recent Labs    08/17/20 1632 08/17/20 2111 08/18/20 0556  GLUCAP 395* 333* 210*         IMAGING x48h  - image(s) personally  visualized  -   highlighted in bold DG CHEST PORT 1 VIEW  Result Date: 08/18/2020 CLINICAL DATA:  Respiratory failure. EXAM: PORTABLE CHEST 1 VIEW  COMPARISON:  08/13/2020.  08/12/2020. FINDINGS: Cardiomegaly. Bibasilar pulmonary infiltrates/edema, slightly improved from prior exam. No pleural effusion or pneumothorax. No acute bony abnormality. IMPRESSION: Cardiomegaly. Bibasilar pulmonary infiltrates/edema, slightly improved from prior exam. Electronically Signed   By: Maisie Fus  Register   On: 08/18/2020 06:08     Resolved Hospital Problem list   SVT  Assessment & Plan:  Acute hypoxic respiratory failure  Bilateral pulmonary infiltrates  Drug induced Pneumonitis vs PNA (PNA seems unlikely) Pulmonary edema  Reported h/o asthma Afib w/ RVR/SVT-->now NSR H/o CAD w/ DES to RCA and prior PCI to Circ and LAD Acute heart failure w/ h/o HFrEF (most recent on 3/30 EF 50%) Mild AS Mild MR CKD stage III DM  Pulmonary Problem list  Acute hypoxic respiratory failure in setting of bilateral pulmonary infiltrates. Lower lobe predominant.  Has not responded to diuresis or antibiotics. PCT negative. Longstanding exposure to amio and pattern would be c/w amiodarone toxicity.    ANCA negative RF marginally elevated.  SLP eval with very mild aspiration risk somewhat driven by dyspne  Diagnosis of exclusion but given O2 requirements reasonable to treat empirically, started on steroids 4/1.   08/18/2020 - now at 4L [place] at rest. Feels better. Refuses cPAP and biPAP QHS  Plan - 60mg  prednisone daily from 08/17/20  ->  Wean by 10mg  every 3 weeks. If recurrence of symptoms, then go back to last successful dose and prolong taper to over one year.  -- Continue bactrim PJP ppx  - will check G6PD - calcium/vit D to hopefully reduce osteoporosis risk (low grade evidence) - PPI QHS - Encourage IS, OOB - Wean O2 to maintain sats >90%  - walk test today to assess dc safety - needs to walk atleat 100 feet </= 10L  and pulse ox >= 88% - Diuretics as tolerated by renal function - Will need close monitoring of blood glucose while on steroids.   - Pulmoanry followup as below with APP and MD to monitor progress  CCM avail for questions  Best practice (right click and "Reselect all SmartList Selections" daily)  Per primary   Patient updated at bedside  Future Appointments  Date Time Provider Department Center  08/25/2020  9:30 AM , NP [facility] None  08/31/2020  3:00 PM [facility] PA/NP [facility] None  09/06/2020  4:15 PM Allred, 09/02/2020, MD [facility]  09/28/2020 10:00 AM Fayrene Fearing, MD [facility] None  11/30/2020  1:20 PM Shamleffer, Kalman Shan, MD [facility]     SIGNATURE    Dr. 12/02/2020, M.D., F.C.C.P,  Pulmonary and Critical Care Medicine Staff Physician, [facility] Center Director - Interstitial Lung Disease  Program  Pulmonary Fibrosis [facility] at [facility] [place], [facility], [place]  Pager: [phone], If no answer  OR between  19:00-7:00h: page [phone] Telephone (clinical office): [phone] Telephone (research): [phone]  10:55 AM 08/18/2020

## 2020-08-18 NOTE — Progress Notes (Addendum)
SATURATION QUALIFICATIONS: (This note is used to comply with regulatory documentation for home oxygen)  Patient Saturations on Room Air at Rest = N/A, he desat on 4L to 75% with transition to EOB seated posture.  Patient Saturations on 10 Liters of oxygen while Ambulating = 75-84%  Patient Saturations on 15 Liters of oxygen while Ambulating = 87-93%  Please briefly explain why patient needs home oxygen: Pt reliant on >10L oxygen with exertion to maintain SpO2 >88%.

## 2020-08-18 NOTE — Progress Notes (Addendum)
Patient ID: David Huynh, male   DOB: [birth year], 80 y.o.   MRN: [id number]     Advanced Heart Failure Rounding Note  PCP-Cardiologist: Loralie Champagne, MD   Subjective:    DCCV to NSR on 3/30.  Echo showed EF still in 50-55% range (stable) with normal RV, moderate biatrial enlargement, mild MR, moderate TR, mild AS.   CT chest with patchy consolidation consistent with PNA versus amiodarone toxicity. ESR 130.  Creatinine 1.85 => 1.69.   Started on solumedrol for amiodarone toxicity, now on prednisone.    Breathing better, walking farther.  I decreased oxygen to 4L [place] and oxygen saturation stayed around 95%.   Objective:   Weight Range: 120.9 kg Body mass index is 36.15 kg/m.   Vital Signs:   Temp:  [97.5 F (36.4 C)-98.4 F (36.9 C)] 98.2 F (36.8 C) (04/06 0236) Pulse Rate:  [61-67] 61 (04/06 0236) Resp:  [16-21] 20 (04/06 0236) BP: (102-145)/(55-82) 145/79 (04/06 0236) SpO2:  [90 %-95 %] 93 % (04/06 0236) Weight:  [120.9 kg] 120.9 kg (04/06 0418) Last BM Date: 08/15/20  Weight change: Filed Weights   08/16/20 0300 08/17/20 0525 08/18/20 0418  Weight: 124.9 kg 121.2 kg 120.9 kg    Intake/Output:   Intake/Output Summary (Last 24 hours) at 08/18/2020 0174 Last data filed at 08/18/2020 0608 Gross per 24 hour  Intake 480 ml  Output 2950 ml  Net -2470 ml      Physical Exam   General: NAD Neck: No JVD, no thyromegaly or thyroid nodule.  Lungs: Crackles at bases.  CV: Nondisplaced PMI.  Heart regular S1/S2, no S3/S4, no murmur.  No peripheral edema.  Abdomen: Soft, nontender, no hepatosplenomegaly, no distention.  Skin: Intact without lesions or rashes.  Neurologic: Alert and oriented x 3.  Psych: Normal affect. Extremities: No clubbing or cyanosis.  HEENT: Normal.    Telemetry   NSR 70s (personally reviewed)    Labs    CBC Recent Labs    08/16/20 0222 08/18/20 0032  WBC  --  14.5*  HGB 13.2 13.0  HCT 38.8* 39.0  MCV  --  92.0  PLT  --  541*    Basic Metabolic Panel Recent Labs    08/17/20 0835 08/18/20 0032  NA 134* 134*  K 4.2 4.1  CL 96* 99  CO2 27 27  GLUCOSE 320* 257*  BUN 63* 58*  CREATININE 1.85* 1.69*  CALCIUM 9.0 9.0  MG 3.0* 3.0*   Liver Function Tests No results for input(s): AST, ALT, ALKPHOS, BILITOT, PROT, ALBUMIN in the last 72 hours. No results for input(s): LIPASE, AMYLASE in the last 72 hours. Cardiac Enzymes No results for input(s): CKTOTAL, CKMB, CKMBINDEX, TROPONINI in the last 72 hours.  BNP: BNP (last 3 results) Recent Labs    12/06/19 1551 08/09/20 1643  BNP 792.4* 339.4*    ProBNP (last 3 results) Recent Labs    12/17/19 1327  PROBNP 593.0*     D-Dimer No results for input(s): DDIMER in the last 72 hours. Hemoglobin A1C No results for input(s): HGBA1C in the last 72 hours. Fasting Lipid Panel No results for input(s): CHOL, HDL, LDLCALC, TRIG, CHOLHDL, LDLDIRECT in the last 72 hours. Thyroid Function Tests No results for input(s): TSH, T4TOTAL, T3FREE, THYROIDAB in the last 72 hours.  Invalid input(s): FREET3  Other results:   Imaging    DG CHEST PORT 1 VIEW  Result Date: 08/18/2020 CLINICAL DATA:  Respiratory failure. EXAM: PORTABLE CHEST 1 VIEW COMPARISON:  08/13/2020.  08/12/2020. FINDINGS: Cardiomegaly. Bibasilar pulmonary infiltrates/edema, slightly improved from prior exam. No pleural effusion or pneumothorax. No acute bony abnormality. IMPRESSION: Cardiomegaly. Bibasilar pulmonary infiltrates/edema, slightly improved from prior exam. Electronically Signed   By: [place]   On: 08/18/2020 06:08     Medications:     Scheduled Medications: . apixaban  5 mg Oral BID  . atorvastatin  80 mg Oral QHS  . calcium-vitamin D  1 tablet Oral BID  . carvedilol  12.5 mg Oral BID WC  . clopidogrel  75 mg Oral Daily  . empagliflozin  10 mg Oral Daily  . ezetimibe  10 mg Oral QHS  . icosapent Ethyl  2 g Oral BID  . insulin aspart  0-5 Units Subcutaneous QHS  .  insulin aspart  0-9 Units Subcutaneous TID WC  . insulin aspart  4 Units Subcutaneous TID WC  . insulin glargine  25 Units Subcutaneous Daily  . loratadine  10 mg Oral Daily  . mouth rinse  15 mL Mouth Rinse BID  . mometasone-formoterol  2 puff Inhalation BID  . pantoprazole  40 mg Oral QHS  . predniSONE  60 mg Oral Q breakfast  . ranolazine  500 mg Oral BID  . sacubitril-valsartan  1 tablet Oral BID  . sulfamethoxazole-trimethoprim  1 tablet Oral Once per day on Mon Wed Fri  . torsemide  40 mg Oral Daily    Infusions:   PRN Medications:      Assessment/Plan   1. Acute on chronic systolic CHF: Prior ischemic cardiomyopathy, improved after PCI in 2017. Echo 03/2018 with EF back down to 35-40% and moderate-severe RV dysfunction. This was in the setting of atrial fibrillation with RVR. EF 40% on TEE 04/2018.  Echo was done in 3/20, showing EF up to 50%.  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  Echo in 7/21 with EF 25-30%, severe RV dysfunction.  TEE in 10/21, however, showed EF up to 50%.  He had PCI to LAD in 10/21.  Echo 08/12/20 with EF stable 50-55%, normal RV.  He is now back in NSR.  Volume status ok.  Creatinine down to 1.69. - Restart torsemide 40 mg daily today.  - Continue Entresto 49/51 bid.   -Increase Coreg back to home dose 18.75 mg bid.  - Continue empagliflozin 10 mg daily.  2. Atrial fibrillation/Atrial flutter: As above, concerned for possible tachy-mediated CMP in past. Had successful DC-CV on 03/29/18 but then back in atrial fibrillation.Ranolazine added, but failed DCCV again 04/02/18. DCCV 04/19/18 was successful and he remained in NSR on amiodarone + ranolazine. He opted against atrial fibrillation ablation in the past (saw Dr. Rayann Heman).  He was noted to be in atrial fibrillation at last clinic visit and plan was made for DCCV if he did not convert, but he was admitted with syncope and rapid SVT in 200s.  SVT was regular, ?1:1  atrial flutter.  In atrial flutter rate 100s on amiodarone gtt => DCCV to NSR on 08/11/20. NSR today. Amiodarone stopped due to concern for amiodarone toxicity. ESR 130 - Continue ranolazine 500 mg bid.  - Continue Coreg as above.  - Continue apixaban.   - Discussed with EP (Dr. Curt Bears).  He recommends DCCV only this admission, then close followup with Dr. Rayann Heman as patient is going to need atrial fibrillation + atrial flutter ablation.  Will need healing of lung process prior to ablation.  - If he needs anti-arrhythmic in future,  Tikosyn would be option but needs amiodarone wash-out.  3. CKD: Stage 3.  BUN/creatinine lower today.  - Restarting home torsemide.  4. CAD: History of DES to RCA in 9/17. UnderwentLHC11/20/19 with PCI to prox left circ.LHC in 10/21 with PCI to LAD. No chest pain. -He is on Eliquis so no ASA.   - Continue Plavix ideally x 1 yr (to 10/22).  - Continue atorvastatin and vascepa.  5. DM2: Per primary.  6. Aortic stenosis: Mild by echo this admission.   7. Mitral regurgitation: Mild by echo this admission.  8. Acute Hypoxic Respiratory Failure: CT chest with patchy consolidation c/w PNA versus amiodarone toxicity.  No fever, normal PCT and WBCs. ESR 130.  Strong suspicion for amiodarone toxicity.  - Off abx, do not think bacterial PNA.  - HFNC down to 4 liters today.  - Amiodarone stopped and Solumedrol IV begun. Transitioned to prednisone.  Pulmonary following.  Appreciate their input. Will need long steroid taper.  -  Bactrim for PJP prophylaxis.  9. Disposition: From my standpoint, he could go home today.  He will need home oxygen (down to 4L [place] currently).  He will need followup with pulmonary, CHF clinic, and Dr. Rayann Heman (EP).  Meds for home: torsemide 40 mg daily, Coreg 18.75 mg bid, Entresto 49/51 bid, atorvastatin 80 daily, apixaban 5 mg bid, Plavix 75 daily, Zetia 10, empagliflozin 10, prednisone taper, Bactrim, ranolazine 500 bid, vascepa 2 g bid.   Loralie Champagne 08/18/2020 8:22 AM

## 2020-08-18 NOTE — Plan of Care (Signed)

## 2020-08-18 NOTE — Progress Notes (Signed)
Inpatient Diabetes Program Recommendations  AACE/ADA: New Consensus Statement on Inpatient Glycemic Control (2015)  Target Ranges:  Prepandial:   less than 140 mg/dL      Peak postprandial:   less than 180 mg/dL (1-2 hours)      Critically ill patients:  140 - 180 mg/dL   Lab Results  Component Value Date   GLUCAP 272 (H) 08/18/2020   HGBA1C 6.5 (A) 05/25/2020    Review of Glycemic Control Results for TACOMA, MERIDA (MRN [id number]) as of 08/18/2020 13:19  Ref. Range 08/17/2020 11:37 08/17/2020 16:32 08/17/2020 21:11 08/18/2020 05:56 08/18/2020 11:59  Glucose-Capillary Latest Ref Range: 70 - 99 mg/dL 409 (H) 811 (H) 914 (H) 210 (H) 272 (H)   Diabetes history:  DM2 Outpatient Diabetes medications:  Jardiance 10 mg daily, Glipizide 5 mg daily, Metformin 500 mg daily Current orders for Inpatient glycemic control:  Lantus 25 units daily, Novolog 0-9 units tID and 0-5 units QHS, 4 units meal coverage TID, Prednisone 60 mg daily  Inpatient Diabetes Program Recommendations:    Lantus 30 units daily  Novolog 6 units TID with meals if eats at least 50%  Will continue to follow while inpatient.  Thank you, Dulce Sellar, RN, BSN Diabetes Coordinator Inpatient Diabetes Program [phone] (team pager from 8a-5p)

## 2020-08-18 NOTE — Progress Notes (Signed)
CARDIAC REHAB PHASE I   PRE:  Rate/Rhythm: 78 SR    BP: sitting     SaO2: 92 3L  MODE:  Ambulation: 90 ft   POST:  Rate/Rhythm: 100 ST    BP: sitting 112/96     SaO2: 88 15L  Pt ambulated short distance on 15L. Maintained 88 15L. Encouraged IS. Return to bed. [phone]   Harriet Masson CES, ACSM 08/18/2020 3:18 PM

## 2020-08-18 NOTE — Progress Notes (Addendum)
PROGRESS NOTE    CECILIO OHLRICH  NID:[id number] DOB: [birth year] DOA: 08/09/2020 PCP: Bradd Canary, MD    Brief Narrative:  Mr. Shankman was admitted to the hospital with the working diagnosis of acute decompensation of diastolic heart failure, complicated with atrial fibrillation/flutter with rapid ventricular response and acute hypoxic respiratory failure.  80 year old male past medical history for coronary artery disease, heart failure, paroxysmal atrial fibrillation, hypertension, dyslipidemia, type II T[place] mellitus, moderate aortic stenosis and chronic kidney disease stage III who presented with a syncope episode.  He reported 48 hours of dyspnea, initially on exertion, progressing to symptoms with minimal efforts.  On the day of admission he stood up from his bed, felt dizzy, and lightheaded, he perceived palpitations and he lost his consciousness.  When EMS arrived he was in atrial fibrillation/flutter 100-120 bpm. Recent follow-up with cardiology 3/10, with instructions to increase diuretic therapy along with amiodarone.  On his initial physical examination blood pressure 141/87, heart rate 116, respiratory rate 27, oxygen saturation 88%.  His lungs had bibasilar rales but no wheezing, heart S1-S2, tachycardic, abdomen soft, +++ bilateral extremity edema.  Chest radiograph with hilar vascular congestion, patchy right lower lobe infiltrate.  EKG 120 bpm, normal axis, QTC 512, sinus rhythm, no significant ST segment or T wave changes.  Patient developed SVT/ flutter 220 bpm, refractive to adenosine, he was placed on amiodarone infusion and underwent electrical cardioversion on 3/30.  Amiodarone had to be discontinued due to medication induced lung injury.  Continue to have high oxygen requirements on ambulation up to 10 L to 15 L/min to keep oxygenation more than 88%  Assessment & Plan:   Principal Problem:   Acute on chronic diastolic CHF (congestive heart failure)  (HCC) Active Problems:   Type 2 diabetes mellitus without complication, without long-term current use of insulin (HCC)   Mild persistent asthma   CAD in native artery   A-fib (HCC)   Acute hypoxemic respiratory failure (HCC)   Syncope and collapse   Acute-on-chronic kidney injury (HCC)   Amiodarone pulmonary toxicity   1.  Acute decompensation of diastolic heart failure, complicated with acute pulmonary edema and acute hypoxic respiratory failure.  Patient was admitted to the progressive care unit, he received intravenous furosemide along with metolazone for aggressive diuresis. A negative fluid balance was achieved (-16,850.5 mL since admission) with significant improvement of his symptoms.  Further work-up with echocardiography showed inferior basal hypokinesis of the left ventricle, ejection fraction 50 to 55%.  RV systolic function preserved.  Normal pulmonary artery systolic pressure.  Moderately dilation of left and right atrium.  Known moderate mitral regurgitation.  Mild dilation of the aortic root, 40 mm.  Continue heart failure management with carvedilol, sacubitril/valsartan and diuresis with torsemide. Empagliflozin.   2.  SVT/atrial fibrillation/flutter, with RVR. (Paroxysmal).  He was closely monitored under telemetry, amiodarone was discontinued due to low toxicity.  He responded well to electrical cardioversion.  Currently continue rate control with carvedilol.  Anticoagulation with apixaban. Continue heart failure management.  3.  Amiodarone lung toxicity/history of asthma..  Patient with patchy infiltrate right lower lobe, initially treated for community-acquired pneumonia, posteriorly lung infection was ruled out, and antibiotics were discontinued.  Reactive leukocytosis/thrombocytosis.  Further work-up with CT chest showed patchy bilateral infiltrates, predominantly at bases. Amiodarone was discontinued and patient was placed on systemic corticosteroids. Will need  a slow taper, follow-up as an outpatient. Patient will be discharged on supplemental oxygen per nasal cannula.  Continue bronchodilator therapy.  4.  AKI on chronic kidney disease stage IIIb.  Patient tolerated well diuresis with furosemide metolazone. Anemia of chronic renal disease.  Follow-up as an outpatient.  5.  Coronary artery disease (hx angioplasty and stent).  Patient remained chest pain-free, continue carvedilol, clopidogrel, apixaban and Ranexa.  6.  Type 2 diabetes mellitus.  Patient was placed on insulin sliding scale for glucose coverage and monitoring. Continue basal insulin during hospitalization.   Status is: Inpatient  Remains inpatient appropriate because:Inpatient level of care appropriate due to severity of illness   Dispo: The patient is from: Home              Anticipated d/c is to: Home              Patient currently is not medically stable to d/c.   Difficult to place patient No   DVT prophylaxis: apixaban   Code Status:   full  Family Communication:  No family at the bedside       Consultants:   Pulmonary   Cardiology    Subjective: Patient is feeling better, dyspnea continue to improve along with oxygen requirements, no nausea or vomiting, no chest pain or lower extremity edema, has been out of bed.   Objective: Vitals:   08/18/20 0236 08/18/20 0418 08/18/20 0912 08/18/20 0924  BP: (!) 145/79   119/65  Pulse: 61   64  Resp: 20   18  Temp: 98.2 F (36.8 C)   97.8 F (36.6 C)  TempSrc: Oral   Oral  SpO2: 93%  92% 91%  Weight:  120.9 kg    Height:        Intake/Output Summary (Last 24 hours) at 08/18/2020 1011 Last data filed at 08/18/2020 0623 Gross per 24 hour  Intake 480 ml  Output 2350 ml  Net -1870 ml   Filed Weights   08/16/20 0300 08/17/20 0525 08/18/20 0418  Weight: 124.9 kg 121.2 kg 120.9 kg    Examination:   General: Not in pain or dyspnea,  Neurology: Awake and alert, non focal  E ENT: mild pallor, no icterus,  oral mucosa moist Cardiovascular: No JVD. S1-S2 present, rhythmic, no gallops, rubs, or murmurs. No lower extremity edema. Pulmonary: positive breath sounds bilaterally,with no wheezing, rhonchi or rales. Gastrointestinal. Abdomen soft and non tender Skin. No rashes Musculoskeletal: no joint deformities     Data Reviewed: I have personally reviewed following labs and imaging studies  CBC: Recent Labs  Lab 08/12/20 0102 08/14/20 0057 08/16/20 0222 08/18/20 0032  WBC 13.4* 7.8  --  14.5*  HGB 11.7* 12.9* 13.2 13.0  HCT 36.1* 39.2 38.8* 39.0  MCV 97.6 94.5  --  92.0  PLT 496* 541*  --  541*   Basic Metabolic Panel: Recent Labs  Lab 08/12/20 0102 08/13/20 0022 08/15/20 0104 08/16/20 0222 08/16/20 1058 08/17/20 0026 08/17/20 0835 08/18/20 0032  NA 140   < > 133* 136  --  135 134* 134*  K 3.8   < > 3.8 5.9* 4.3 4.4 4.2 4.1  CL 106   < > 96* 97*  --  98 96* 99  CO2 25   < > 28 27  --  29 27 27   GLUCOSE 130*   < > 342* 297*  --  261* 320* 257*  BUN 21   < > 48* 59*  --  62* 63* 58*  CREATININE 1.68*   < > 1.76* 1.74*  --  1.77* 1.85* 1.69*  CALCIUM 8.3*   < >  8.7* 8.9  --  8.9 9.0 9.0  MG 2.6*  --   --   --   --   --  3.0* 3.0*   < > = values in this interval not displayed.   GFR: Estimated Creatinine Clearance: 47.6 mL/min (A) (by C-G formula based on SCr of 1.69 mg/dL (H)). Liver Function Tests: No results for input(s): AST, ALT, ALKPHOS, BILITOT, PROT, ALBUMIN in the last 168 hours. No results for input(s): LIPASE, AMYLASE in the last 168 hours. No results for input(s): AMMONIA in the last 168 hours. Coagulation Profile: No results for input(s): INR, PROTIME in the last 168 hours. Cardiac Enzymes: No results for input(s): CKTOTAL, CKMB, CKMBINDEX, TROPONINI in the last 168 hours. BNP (last 3 results) Recent Labs    12/17/19 1327  PROBNP 593.0*   HbA1C: No results for input(s): HGBA1C in the last 72 hours. CBG: Recent Labs  Lab 08/17/20 0615  08/17/20 1137 08/17/20 1632 08/17/20 2111 08/18/20 0556  GLUCAP 270* 277* 395* 333* 210*   Lipid Profile: No results for input(s): CHOL, HDL, LDLCALC, TRIG, CHOLHDL, LDLDIRECT in the last 72 hours. Thyroid Function Tests: No results for input(s): TSH, T4TOTAL, FREET4, T3FREE, THYROIDAB in the last 72 hours. Anemia Panel: No results for input(s): VITAMINB12, FOLATE, FERRITIN, TIBC, IRON, RETICCTPCT in the last 72 hours.    Radiology Studies: I have reviewed all of the imaging during this hospital visit personally     Scheduled Meds: . apixaban  5 mg Oral BID  . atorvastatin  80 mg Oral QHS  . calcium-vitamin D  1 tablet Oral BID  . carvedilol  18.75 mg Oral BID WC  . clopidogrel  75 mg Oral Daily  . empagliflozin  10 mg Oral Daily  . ezetimibe  10 mg Oral QHS  . icosapent Ethyl  2 g Oral BID  . insulin aspart  0-5 Units Subcutaneous QHS  . insulin aspart  0-9 Units Subcutaneous TID WC  . insulin aspart  4 Units Subcutaneous TID WC  . insulin glargine  25 Units Subcutaneous Daily  . loratadine  10 mg Oral Daily  . mouth rinse  15 mL Mouth Rinse BID  . mometasone-formoterol  2 puff Inhalation BID  . pantoprazole  40 mg Oral QHS  . predniSONE  60 mg Oral Q breakfast  . ranolazine  500 mg Oral BID  . sacubitril-valsartan  1 tablet Oral BID  . sulfamethoxazole-trimethoprim  1 tablet Oral Once per day on Mon Wed Fri  . torsemide  40 mg Oral Daily   Continuous Infusions:   LOS: 9 days        Malasha Kleppe Annett Gula, MD

## 2020-08-18 NOTE — Progress Notes (Signed)
Physical Therapy Treatment Patient Details Name: David Huynh MRN: [id number] DOB: 1940-07-14 Today's Date: 08/18/2020    History of Present Illness 80 yo admitted 3/28 with SOB, bil LE edema, and syncope. 3/29 SVT, 3/30 DCCV. Pt diuresed for CHF and also with Rt pleural effusion and respiratory failure. CT chest with patchy consolidation consistent with PNA versus amiodarone toxicity. Started on solumedrol for amiodarone toxicity, now on prednisone. PMhx: CAD s/p PCI, Afib, CHF, CKD, DM, HLD, HTN.    PT Comments    Pt received in supine, agreeable to therapy session and with good participation and tolerance for mobility, pt mainly limited due to SpO2 desaturation with exertion. Pt given significant instruction on postural and breathing techniques to improve SpO2 saturation and use of IS and encouraged seated posture during day frequently for improved pulmonary clearance/endurance building. Pt remains reliant on RW for stability and 10-15L HF [place] to maintain SpO2 >88% with seated/standing tasks, MD/RN notified. Pt continues to benefit from PT services to progress toward functional mobility goals. Continue to recommend HHPT.   Follow Up Recommendations  Home health PT     Equipment Recommendations  Rolling walker with 5" wheels;3in1 (PT)    Recommendations for Other Services OT consult     Precautions / Restrictions Precautions Precautions: Fall Precaution Comments: watch sats Restrictions Weight Bearing Restrictions: No    Mobility  Bed Mobility Overal bed mobility: Modified Independent             General bed mobility comments: use of bed features    Transfers Overall transfer level: Needs assistance   Transfers: Sit to/from Stand Sit to Stand: Supervision         General transfer comment: cues for hand placement with decreased eccentric control to sit  Ambulation/Gait Ambulation/Gait assistance: Min guard Gait Distance (Feet): 150 Feet Assistive device:  Rolling walker (2 wheeled) Gait Pattern/deviations: Step-through pattern;Decreased stride length Gait velocity: decreased, <0.4 m/s   General Gait Details: cues for posture, proximity to RW, breathing technique and energy conservation. Pt on 10L HF [place] once standing due to drop in SpO2 with sats gradually declining from 88-75% with 25' of gait on 10L even with standing breaks and cues for slow, deep breathing. 1 min standing rest at 25' with sats only able to recover to 84% and needed increase to 15L O2 [place] to improve to >84%, remained 88-93% on 15L; once returned to EOB, returned to 10L and needed >2 minutes to improve to SpO2 88%, then returned to 5L HF [place] resting in chair with SpO2 ~89-90%   Stairs             Wheelchair Mobility    Modified Rankin (Stroke Patients Only)       Balance Overall balance assessment: Needs assistance Sitting-balance support: No upper extremity supported;Feet supported Sitting balance-Leahy Scale: Good Sitting balance - Comments: EOB without assist   Standing balance support: Bilateral upper extremity supported;During functional activity Standing balance-Leahy Scale: Poor Standing balance comment: bil UE support on RW                            Cognition Arousal/Alertness: Awake/alert Behavior During Therapy: WFL for tasks assessed/performed Overall Cognitive Status: Within Functional Limits for tasks assessed                                 General Comments: pleasant, cooperative; at times  tangential      Exercises General Exercises - Lower Extremity Ankle Circles/Pumps: AROM;Both;10 reps;Supine Long Arc Quad: AROM;Both;Seated;15 reps Hip Flexion/Marching: AROM;Both;15 reps;Seated    General Comments General comments (skin integrity, edema, etc.): see gait comments above for O2 sats; pt remains reliant on 10-15L HF [place] to maintain SpO2 >88% with exertion and static standing; significant emphasis on use of IS and  flutter valve and pt given printed instruction/chart to track IS use and HEP handout for BLE strengthening exercises      Pertinent Vitals/Pain Pain Assessment: No/denies pain    Home Living                      Prior Function            PT Goals (current goals can now be found in the care plan section) Acute Rehab PT Goals Patient Stated Goal: go home PT Goal Formulation: With patient Time For Goal Achievement: 08/31/20 Potential to Achieve Goals: Fair Progress towards PT goals: Progressing toward goals    Frequency    Min 3X/week      PT Plan Current plan remains appropriate    Co-evaluation              AM-PAC PT "6 Clicks" Mobility   Outcome Measure  Help needed turning from your back to your side while in a flat bed without using bedrails?: None Help needed moving from lying on your back to sitting on the side of a flat bed without using bedrails?: A Little Help needed moving to and from a bed to a chair (including a wheelchair)?: A Little Help needed standing up from a chair using your arms (e.g., wheelchair or bedside chair)?: A Little Help needed to walk in hospital room?: A Little Help needed climbing 3-5 steps with a railing? : A Lot 6 Click Score: 18    End of Session Equipment Utilized During Treatment: Gait belt;Oxygen Activity Tolerance: Patient tolerated treatment well Patient left: in chair;with call bell/phone within reach Nurse Communication: Mobility status;Other (comment) (SpO2 desat with exertion, MD/RN notified) PT Visit Diagnosis: Other abnormalities of gait and mobility (R26.89);Difficulty in walking, not elsewhere classified (R26.2);Muscle weakness (generalized) (M62.81)     Time: 7048-8891 PT Time Calculation (min) (ACUTE ONLY): 35 min  Charges:  $Gait Training: 8-22 mins $Therapeutic Activity: 8-22 mins                     Dayla Gasca P., PTA Acute Rehabilitation Services Pager: [phone] Office:  [phone]   Angus [place] 08/18/2020, 1:36 PM

## 2020-08-19 ENCOUNTER — Telehealth: Payer: Self-pay | Admitting: Family Medicine

## 2020-08-19 DIAGNOSIS — J9601 Acute respiratory failure with hypoxia: Secondary | ICD-10-CM | POA: Diagnosis not present

## 2020-08-19 LAB — GLUCOSE, CAPILLARY
Glucose-Capillary: 216 mg/dL — ABNORMAL HIGH (ref 70–99)
Glucose-Capillary: 329 mg/dL — ABNORMAL HIGH (ref 70–99)
Glucose-Capillary: 265 mg/dL — ABNORMAL HIGH (ref 70–99)
Glucose-Capillary: 311 mg/dL — ABNORMAL HIGH (ref 70–99)
Glucose-Capillary: 279 mg/dL — ABNORMAL HIGH (ref 70–99)

## 2020-08-19 LAB — BASIC METABOLIC PANEL
Anion gap: 7 (ref 5–15)
BUN: 59 mg/dL — ABNORMAL HIGH (ref 8–23)
CO2: 29 mmol/L (ref 22–32)
Calcium: 8.5 mg/dL — ABNORMAL LOW (ref 8.9–10.3)
Chloride: 99 mmol/L (ref 98–111)
Creatinine, Ser: 1.93 mg/dL — ABNORMAL HIGH (ref 0.61–1.24)
GFR, Estimated: 35 mL/min — ABNORMAL LOW (ref >60)
Glucose, Bld: 272 mg/dL — ABNORMAL HIGH (ref 70–99)
Potassium: 4 mmol/L (ref 3.5–5.1)
Sodium: 135 mmol/L (ref 135–145)

## 2020-08-19 LAB — GLUCOSE 6 PHOSPHATE DEHYDROGENASE
G6PDH: 11.9 U/g{Hb} (ref 4.8–15.7)
Hemoglobin: 13.1 g/dL (ref 13.0–17.7)

## 2020-08-19 MED ORDER — INSULIN GLARGINE 100 UNIT/ML ~~LOC~~ SOLN
5.0000 [IU] | Freq: Once | SUBCUTANEOUS | Status: AC
  Administered 2020-08-19: 5 [IU] via SUBCUTANEOUS
  Filled 2020-08-19: qty 0.05

## 2020-08-19 MED ORDER — INSULIN GLARGINE 100 UNIT/ML ~~LOC~~ SOLN
30.0000 [IU] | Freq: Every day | SUBCUTANEOUS | Status: DC
  Administered 2020-08-20: 30 [IU] via SUBCUTANEOUS
  Filled 2020-08-19: qty 0.3

## 2020-08-19 MED ORDER — INSULIN ASPART 100 UNIT/ML ~~LOC~~ SOLN
5.0000 [IU] | Freq: Three times a day (TID) | SUBCUTANEOUS | Status: DC
  Administered 2020-08-19 – 2020-08-20 (×2): 5 [IU] via SUBCUTANEOUS

## 2020-08-19 NOTE — TOC Progression Note (Signed)
Transition of Care [facility]) - Progression Note    Patient Details  Name: David Huynh MRN: [id number] Date of Birth: [birth year]  [facility] CM/SW Contact  Leone Haven, RN Phone Number: 08/19/2020, 4:24 PM  Clinical Narrative:    NCM spoke with patient , he does not want a rolling walker , he states he has a rollator, he does not want a 3 n1 , but he does need the oxygen.  He is set up with [facility] for HHPT , he lives in [place].  Zach with [employer] is following trying to see what we can do about his high oxygen requirment.  Will have MD to call Ian Malkin  To discuss.         Expected Discharge Plan and Services                                                 Social Determinants of Health (SDOH) Interventions    Readmission Risk Interventions No flowsheet data found.

## 2020-08-19 NOTE — Progress Notes (Signed)
NAME:  David Huynh, MRN:  [id number], DOB:  Apr 12, 1941, LOS: 10 ADMISSION DATE:  08/09/2020, CONSULTATION DATE:  4/1 REFERRING MD:  Catha Gosselin, CHIEF COMPLAINT:  Acute hypoxic resp failure    brief  80 year old male w/ marked cardiac hx (see below) admitted 3/28 for cc 2 d h/o inc SOB, palps and LE edema. then when getting up the am of 3/28 had syncopal event. EMS called. Admitted w/ working dx of CAP and treated w. IVF and abx. Cards consulted for SVT w/ HR 200s on 3/29. PCCM was actually consulted that day we gave amio bolus and pt improved. He was later seen by cardiology who felt this was primarily tachycardia related HF and he subsequently underwent DCCV on 3/30 w/ successful conversion to NSR. On 3/31 he still had exertional dyspnea diuretics continued. Noted to desaturate at night placed on BIPAP. As of 4/1 he is -4.2 liters w/ negative BNP, sed rate of 130 and had dense bilateral patchy airspace disease on film from 3/31. This was a little worse than the initial admitting cxr. He has remained on anticoagulation, he has had fairly aggressive diuresis, on 4/1 placed back on HD and rate is better controlled. In spite of these interventions his oxygen requirements have increased from 6l high flow to 14 which was the reason for PCCM re-consultation    Pertinent  Medical History  CAD DES RCA 17) PCI to left cor and PCI to LAD *2019). , HFrEF most recently EF 59% but has had several episodes since 2917 where it dropped in 20s.  Atrial fib/flutter  W/ prior failed DCCV CKD stage 3 (cr 1.6 range) HL DM type 2 AS  MR  Significant Hospital Events: Including procedures, antibiotic start and stop dates in addition to other pertinent events   . 3/28 admitted. Felt CAP got azith and ctx. Fluid bolus.  . 3/29 SVT in 200s. Seen by ccm. Amiodarone bolus given after rate controled BP better. Later seen by cards. Felt not PNA but more likely HR related CM and acute HF got lasix and rate control . 3/30  underwent successful DCCV to NSR . 3/31 still hypoxic. CXR worse than on admit . 4/1 abx azithro and ceftriaxone reordered. PCT negative. Sed rate 130. Up from 6 l to 14 liters since admit (ins spite of being almost 5 liters neg) so PCCM consulted.   . 4/3 improved symptoms at rest . 4/4 - improved dyspnea., 6-8L o2 HFNC . Still desats with exertion. On Solumedrol.  . 08/17/20 - Per cards improved with solumedrol for amio tox. Down to 6L HFNC at rest and 10L with exertion. Creat 1.77/BUN 62. Cards holding torsemide today. Off solumedrol., Now on prednisone 60mg  daily - decrease by 10mg  every  3 weeks is plan .Refuseds cPAP and biPAP. Says he is doing better and pulling more volume with IS . 4/6 -on prednisone 60 mg.  Down to 4 L nasal cannula at rest 93%.  Desaturated yesterday to 85% on 10 L nasal cannula walking 100 feet.  Today's walk test is pending.  But he is feeling better.  Cardiology suspect he can go home.CXR some better  .   Interim History / Subjective:   4/7 - walked 90 feet on 10L and maintained pulse ox 88% (yesterday needed 15L). AT rest 4L and 93% . Creat up at 1.9mg % (baseline 1.7mg % in March 2022 and 1.25-1.37 in Jan 2022). Wants to go home. AGain refused night cpap/bipap.   Objective  Blood pressure 124/68, pulse 69, temperature 97.7 F (36.5 C), temperature source Oral, resp. rate 18, height 6' (1.829 m), weight 118.6 kg, SpO2 93 %.        Intake/Output Summary (Last 24 hours) at 08/19/2020 1403 Last data filed at 08/19/2020 1237 Gross per 24 hour  Intake 360 ml  Output 4650 ml  Net -4290 ml   Filed Weights   08/17/20 0525 08/18/20 0418 08/19/20 0412  Weight: 121.2 kg 120.9 kg 118.6 kg    General Appearance:  Looks well Head:  Normocephalic, without obvious abnormality, atraumatic Eyes:  PERRL -yes, conjunctiva/corneas - mudd     Ears:  Normal external ear canals, both ears Nose:  G tube - no but has [place] 4L Throat:  ETT TUBE - no , OG tube - no Neck:  Supple,  No  enlargement/tenderness/nodules Lungs: Clear to auscultation bilaterally,  Heart:  S1 and S2 normal, no murmur, CVP - no.  Pressors - no Abdomen:  Soft, no masses, no organomegaly Genitalia / Rectal:  Not done Extremities:  Extremities- intact Skin:  ntact in exposed areas . Sacral area - x Neurologic:  Sedation - none -> RASS - +1 . Moves all 4s - yes. CAM-ICU - neg . Orientation - x3+    Labs/imaging that I havepersonally reviewed  (right click and "Reselect all SmartList Selections" daily)    LABS    PULMONARY No results for input(s): PHART, PCO2ART, PO2ART, HCO3, TCO2, O2SAT in the last 168 hours.  Invalid input(s): PCO2, PO2  CBC Recent Labs  Lab 08/14/20 0057 08/16/20 0222 08/18/20 0032  HGB 12.9* 13.2 13.0  HCT 39.2 38.8* 39.0  WBC 7.8  --  14.5*  PLT 541*  --  541*    COAGULATION No results for input(s): INR in the last 168 hours.  CARDIAC  No results for input(s): TROPONINI in the last 168 hours. No results for input(s): PROBNP in the last 168 hours.   CHEMISTRY Recent Labs  Lab 08/16/20 0222 08/16/20 1058 08/17/20 0026 08/17/20 0835 08/18/20 0032 08/19/20 0035  NA 136  --  135 134* 134* 135  K 5.9*   < > 4.4 4.2 4.1 4.0  CL 97*  --  98 96* 99 99  CO2 27  --  29 27 27 29   GLUCOSE 297*  --  261* 320* 257* 272*  BUN 59*  --  62* 63* 58* 59*  CREATININE 1.74*  --  1.77* 1.85* 1.69* 1.93*  CALCIUM 8.9  --  8.9 9.0 9.0 8.5*  MG  --   --   --  3.0* 3.0*  --    < > = values in this interval not displayed.   Estimated Creatinine Clearance: 41.3 mL/min (A) (by C-G formula based on SCr of 1.93 mg/dL (H)).   LIVER No results for input(s): AST, ALT, ALKPHOS, BILITOT, PROT, ALBUMIN, INR in the last 168 hours.   INFECTIOUS Recent Labs  Lab 08/13/20 0022  PROCALCITON 0.14     ENDOCRINE CBG (last 3)  Recent Labs    08/19/20 0624 08/19/20 0902 08/19/20 1134  GLUCAP 216* 329* 265*         IMAGING x48h  - image(s) personally visualized   -   highlighted in bold DG CHEST PORT 1 VIEW  Result Date: 08/18/2020 CLINICAL DATA:  Respiratory failure. EXAM: PORTABLE CHEST 1 VIEW COMPARISON:  08/13/2020.  08/12/2020. FINDINGS: Cardiomegaly. Bibasilar pulmonary infiltrates/edema, slightly improved from prior exam. No pleural effusion or pneumothorax. No acute bony  abnormality. IMPRESSION: Cardiomegaly. Bibasilar pulmonary infiltrates/edema, slightly improved from prior exam. Electronically Signed   By: Maisie Fus  Register   On: 08/18/2020 06:08     Resolved Hospital Problem list   SVT  Assessment & Plan:  Acute hypoxic respiratory failure  Bilateral pulmonary infiltrates  Drug induced Pneumonitis vs PNA (PNA seems unlikely) Pulmonary edema  Reported h/o asthma Afib w/ RVR/SVT-->now NSR H/o CAD w/ DES to RCA and prior PCI to Circ and LAD Acute heart failure w/ h/o HFrEF (most recent on 3/30 EF 50%) Mild AS Mild MR CKD stage III DM  Pulmonary Problem list  Acute hypoxic respiratory failure in setting of bilateral pulmonary infiltrates. Lower lobe predominant.  Has not responded to diuresis or antibiotics. PCT negative. Longstanding exposure to amio and pattern would be c/w amiodarone toxicity.    ANCA negative RF marginally elevated.  SLP eval with very mild aspiration risk somewhat driven by dyspne  Diagnosis of amio lung tox - made on clinical grounds - started on steroids 4/1.   08/19/2020 - . Refuses cPAP and biPAP QHS. tolerted walking 100 feet on 10L [place]  Plan - 60mg  prednisone daily from 08/17/20  ->  Wean by 10mg  every 3 weeks. If recurrence of symptoms, then go back to last successful dose and prolong taper to over one year.  -- Continue bactrim PJP ppx   - wait G6PD - calcium/vit D to hopefully reduce osteoporosis risk (low grade evidence) - PPI QHS - Encourage IS, OOB - Wean O2 to maintain sats >90%  -- Diuretics as tolerated by renal function - Will need close monitoring of blood glucose while on steroids.   -  Pulmoanry followup as below with APP and MD to monitor progress  CCM will sign off  Best practice (right click and "Reselect all SmartList Selections" daily)  Per primary   Patient updated at bedside  Future Appointments  Date Time Provider Department Center  08/25/2020  9:30 AM , NP [facility] None  08/31/2020  3:00 PM [facility] PA/NP [facility] None  09/06/2020  4:15 PM Allred, 09/02/2020, MD [facility]  09/28/2020 10:00 AM Fayrene Fearing, MD [facility] None  11/30/2020  1:20 PM Shamleffer, Kalman Shan, MD [facility]     SIGNATURE    Dr. 12/02/2020, M.D., F.C.C.P,  Pulmonary and Critical Care Medicine Staff Physician, [facility] Center Director - Interstitial Lung Disease  Program  Pulmonary Fibrosis [facility] at [facility] [place], [facility], [place]  Pager: [phone], If no answer  OR between  19:00-7:00h: page [phone]  [phone] Telephone (clinical office): [phone] Telephone (research): [phone]  2:03 PM 08/19/2020

## 2020-08-19 NOTE — Telephone Encounter (Signed)
Burgess Amor form [facility] health  Caller # [phone]  Pt is being discharge today from hospital Tanya would like to know if Dr.Blyth will sign off for his PT and OT Nursing   Please Advice

## 2020-08-19 NOTE — Progress Notes (Signed)
Inpatient Diabetes Program Recommendations  AACE/ADA: New Consensus Statement on Inpatient Glycemic Control (2015)  Target Ranges:  Prepandial:   less than 140 mg/dL      Peak postprandial:   less than 180 mg/dL (1-2 hours)      Critically ill patients:  140 - 180 mg/dL   Lab Results  Component Value Date   GLUCAP 329 (H) 08/19/2020   HGBA1C 6.5 (A) 05/25/2020    Review of Glycemic Control Results for DAVIDE, RISDON (MRN [id number]) as of 08/19/2020 10:59  Ref. Range 08/18/2020 11:59 08/18/2020 16:01 08/18/2020 21:15 08/19/2020 06:24 08/19/2020 09:02  Glucose-Capillary Latest Ref Range: 70 - 99 mg/dL 248 (H) 250 (H) 037 (H) 216 (H) 329 (H)    Diabetes history:  DM2 Outpatient Diabetes medications:  Jardiance 10 mg daily, Glipizide 5 mg daily, Metformin 500 mg daily Current orders for Inpatient glycemic control:  Lantus 25 units daily, Novolog 0-9 units TID and 0-5 units QHS, 4 units meal coverage TID, Prednisone 60 mg daily  Inpatient Diabetes Program Recommendations:    Lantus 30 units daily  Novolog 0-15 units TID   Will continue to follow while inpatient.  Thank you, Dulce Sellar, RN, BSN Diabetes Coordinator Inpatient Diabetes Program [phone] (team pager from 8a-5p)

## 2020-08-19 NOTE — Progress Notes (Signed)
CARDIAC REHAB PHASE I   PRE:  Rate/Rhythm: 83 SR    BP: sitting 124/68    SaO2: 91 3L HFNC  MODE:  Ambulation: 90 ft   POST:  Rate/Rhythm: 90 SR    BP: sitting 119/75     SaO2: 88-89 10L  SATURATION QUALIFICATIONS: (This note is used to comply with regulatory documentation for home oxygen)  Patient Saturations on Room Air at Rest = n/a%  Patient Saturations on Room Air while Ambulating = n/a%  Patient Saturations on 10 Liters of oxygen while Ambulating = 86%  Please briefly explain why patient needs home oxygen:  Pt took long rests between every stage (after sitting up, after standing, x2 during walk). SaO2 86-89 10L with activity/walking. Encouraged pt to take time for rest at home. He sts he has a RW/rollator at home.  [phone]    Harriet Masson CES, ACSM 08/19/2020 1:35 PM

## 2020-08-19 NOTE — Progress Notes (Signed)
Patient ID: David Huynh, male   DOB: Jan 03, 1941, 80 y.o.   MRN: [id number]     Advanced Heart Failure Rounding Note  PCP-Cardiologist: Loralie Champagne, MD   Subjective:    DCCV to NSR on 3/30.  Echo showed EF still in 50-55% range (stable) with normal RV, moderate biatrial enlargement, mild MR, moderate TR, mild AS.   CT chest with patchy consolidation consistent with PNA versus amiodarone toxicity. ESR 130.  Creatinine 1.85 => 1.69 => 1.93.   Started on solumedrol for amiodarone toxicity, now on prednisone.    He did not go home yesterday due to requiring too much oxygen with ambulation.  This morning, oxygen saturation in 90s on 4L [place] at rest.   Objective:   Weight Range: 118.6 kg Body mass index is 35.46 kg/m.   Vital Signs:   Temp:  [97.5 F (36.4 C)-98 F (36.7 C)] 97.7 F (36.5 C) (04/07 0800) Pulse Rate:  [61-77] 77 (04/07 0800) Resp:  [17-20] 17 (04/07 0800) BP: (107-147)/(56-82) 127/59 (04/07 0800) SpO2:  [90 %-94 %] 93 % (04/07 0800) Weight:  [118.6 kg] 118.6 kg (04/07 0412) Last BM Date: 08/18/20  Weight change: Filed Weights   08/17/20 0525 08/18/20 0418 08/19/20 0412  Weight: 121.2 kg 120.9 kg 118.6 kg    Intake/Output:   Intake/Output Summary (Last 24 hours) at 08/19/2020 0952 Last data filed at 08/19/2020 0624 Gross per 24 hour  Intake 720 ml  Output 3950 ml  Net -3230 ml      Physical Exam   General: NAD Neck: No JVD, no thyromegaly or thyroid nodule.  Lungs: Mild crackles at bases.  CV: Nondisplaced PMI.  Heart regular S1/S2, no S3/S4, no murmur.  No peripheral edema.   Abdomen: Soft, nontender, no hepatosplenomegaly, no distention.  Skin: Intact without lesions or rashes.  Neurologic: Alert and oriented x 3.  Psych: Normal affect. Extremities: No clubbing or cyanosis.  HEENT: Normal.    Telemetry   NSR 70s (personally reviewed)    Labs    CBC Recent Labs    08/18/20 0032  WBC 14.5*  HGB 13.0  HCT 39.0  MCV 92.0  PLT  785*   Basic Metabolic Panel Recent Labs    08/17/20 0835 08/18/20 0032 08/19/20 0035  NA 134* 134* 135  K 4.2 4.1 4.0  CL 96* 99 99  CO2 $Re'27 27 29  'EUF$ GLUCOSE 320* 257* 272*  BUN 63* 58* 59*  CREATININE 1.85* 1.69* 1.93*  CALCIUM 9.0 9.0 8.5*  MG 3.0* 3.0*  --    Liver Function Tests No results for input(s): AST, ALT, ALKPHOS, BILITOT, PROT, ALBUMIN in the last 72 hours. No results for input(s): LIPASE, AMYLASE in the last 72 hours. Cardiac Enzymes No results for input(s): CKTOTAL, CKMB, CKMBINDEX, TROPONINI in the last 72 hours.  BNP: BNP (last 3 results) Recent Labs    12/06/19 1551 08/09/20 1643  BNP 792.4* 339.4*    ProBNP (last 3 results) Recent Labs    12/17/19 1327  PROBNP 593.0*     D-Dimer No results for input(s): DDIMER in the last 72 hours. Hemoglobin A1C No results for input(s): HGBA1C in the last 72 hours. Fasting Lipid Panel No results for input(s): CHOL, HDL, LDLCALC, TRIG, CHOLHDL, LDLDIRECT in the last 72 hours. Thyroid Function Tests No results for input(s): TSH, T4TOTAL, T3FREE, THYROIDAB in the last 72 hours.  Invalid input(s): FREET3  Other results:   Imaging    No results found.   Medications:  Scheduled Medications: . apixaban  5 mg Oral BID  . atorvastatin  80 mg Oral QHS  . calcium-vitamin D  1 tablet Oral BID  . carvedilol  18.75 mg Oral BID WC  . clopidogrel  75 mg Oral Daily  . empagliflozin  10 mg Oral Daily  . ezetimibe  10 mg Oral QHS  . icosapent Ethyl  2 g Oral BID  . insulin aspart  0-5 Units Subcutaneous QHS  . insulin aspart  0-9 Units Subcutaneous TID WC  . insulin aspart  4 Units Subcutaneous TID WC  . insulin glargine  25 Units Subcutaneous Daily  . loratadine  10 mg Oral Daily  . mouth rinse  15 mL Mouth Rinse BID  . mometasone-formoterol  2 puff Inhalation BID  . pantoprazole  40 mg Oral QHS  . predniSONE  60 mg Oral Q breakfast  . ranolazine  500 mg Oral BID  . sacubitril-valsartan  1 tablet  Oral BID  . sulfamethoxazole-trimethoprim  1 tablet Oral Once per day on Mon Wed Fri    Infusions:   PRN Medications:      Assessment/Plan   1. Acute on chronic systolic CHF: Prior ischemic cardiomyopathy, improved after PCI in 2017. Echo 03/2018 with EF back down to 35-40% and moderate-severe RV dysfunction. This was in the setting of atrial fibrillation with RVR. EF 40% on TEE 04/2018.  Echo was done in 3/20, showing EF up to 50%.  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  Echo in 7/21 with EF 25-30%, severe RV dysfunction.  TEE in 10/21, however, showed EF up to 50%.  He had PCI to LAD in 10/21.  Echo 08/12/20 with EF stable 50-55%, normal RV.  He is now back in NSR.  Volume status ok.  Creatinine higher at 1.93. - Hold torsemide x 2 days, restart at 20 mg daily on 4/10.  - Continue Entresto 49/51 bid.   - Continue Coreg 18.75 mg bid.  - Continue empagliflozin 10 mg daily.  2. Atrial fibrillation/Atrial flutter: As above, concerned for possible tachy-mediated CMP in past. Had successful DC-CV on 03/29/18 but then back in atrial fibrillation.Ranolazine added, but failed DCCV again 04/02/18. DCCV 04/19/18 was successful and he remained in NSR on amiodarone + ranolazine. He opted against atrial fibrillation ablation in the past (saw Dr. Rayann Heman).  He was noted to be in atrial fibrillation at last clinic visit and plan was made for DCCV if he did not convert, but he was admitted with syncope and rapid SVT in 200s.  SVT was regular, ?1:1 atrial flutter.  In atrial flutter rate 100s on amiodarone gtt => DCCV to NSR on 08/11/20. NSR today. Amiodarone stopped due to concern for amiodarone toxicity. ESR 130 - Continue ranolazine 500 mg bid.  - Continue Coreg as above.  - Continue apixaban.   - Discussed with EP (Dr. Curt Bears).  He recommends DCCV only this admission, then close followup with Dr. Rayann Heman as patient is going to need atrial fibrillation + atrial flutter  ablation.  Will need healing of lung process prior to ablation.  - If he needs anti-arrhythmic in future, Tikosyn would be option but needs amiodarone wash-out.  3. CKD: Stage 3.  Creatinine higher today.  - Change torsemide as above.  4. CAD: History of DES to RCA in 9/17. UnderwentLHC11/20/19 with PCI to prox left circ.LHC in 10/21 with PCI to LAD. No chest pain. -He is on Eliquis so no ASA.   -  Continue Plavix ideally x 1 yr (to 10/22).  - Continue atorvastatin and vascepa.  5. DM2: Per primary.  6. Aortic stenosis: Mild by echo this admission.   7. Mitral regurgitation: Mild by echo this admission.  8. Acute Hypoxic Respiratory Failure: CT chest with patchy consolidation c/w PNA versus amiodarone toxicity.  No fever, normal PCT and WBCs. ESR 130.  Strong suspicion for amiodarone toxicity.  - Off abx, do not think bacterial PNA.  - HFNC down to 4 liters today.  Needs to be able to walk on </= 10 L oxygen with saturation >/= 88%.  - Amiodarone stopped and Solumedrol IV begun. Transitioned to prednisone.  Pulmonary following.  Appreciate their input. Will need long steroid taper.  -  Bactrim for PJP prophylaxis.  9. Disposition: From my standpoint, he could go home today if oxygen saturation with ambulation is adequate as above.  He will need home oxygen.  He will need followup with pulmonary, CHF clinic, and Dr. Rayann Heman (EP).  Meds for home: torsemide 20 mg daily to start on 08/22/20, Coreg 18.75 mg bid, Entresto 49/51 bid, atorvastatin 80 daily, apixaban 5 mg bid, Plavix 75 daily, Zetia 10, empagliflozin 10, prednisone taper, Bactrim, ranolazine 500 bid, vascepa 2 g bid.   Loralie Champagne 08/19/2020 9:52 AM

## 2020-08-19 NOTE — Telephone Encounter (Signed)
Yes but unless he has been seen in the last 30 days he needs a video visit in the next 30 days

## 2020-08-19 NOTE — Plan of Care (Signed)
  Problem: Education: Goal: Knowledge of General Education information will improve Description: Including pain rating scale, medication(s)/side effects and non-pharmacologic comfort measures Outcome: Progressing   Problem: Health Behavior/Discharge Planning: Goal: Ability to manage health-related needs will improve Outcome: Progressing   Problem: Clinical Measurements: Goal: Respiratory complications will improve Outcome: Progressing   Problem: Activity: Goal: Risk for activity intolerance will decrease Outcome: Progressing   Problem: Elimination: Goal: Will not experience complications related to urinary retention Outcome: Progressing   Problem: Pain Managment: Goal: General experience of comfort will improve Outcome: Progressing   Problem: Skin Integrity: Goal: Risk for impaired skin integrity will decrease Outcome: Progressing

## 2020-08-19 NOTE — Discharge Instructions (Signed)

## 2020-08-19 NOTE — Progress Notes (Addendum)
PROGRESS NOTE    SAAGAR TORTORELLA  YQM:[id number] DOB: 14-Jul-1940 DOA: 08/09/2020 PCP: Bradd Canary, MD    Brief Narrative:  Mr. Mallek was admitted to the hospital with the working diagnosis of acute decompensation of diastolic heart failure, complicated with atrial fibrillation/flutter with rapid ventricular response and acute hypoxic respiratory failure.  80 year old male past medical history for coronary artery disease, heart failure, paroxysmal atrial fibrillation, hypertension, dyslipidemia, type II Titus mellitus, moderate aortic stenosis and chronic kidney disease stage III who presented with a syncope episode.  He reported 48 hours of dyspnea, initially on exertion, progressing to symptoms with minimal efforts.  On the day of admission he stood up from his bed, felt dizzy, and lightheaded, he perceived palpitations and he lost his consciousness.  When EMS arrived he was in atrial fibrillation/flutter 100-120 bpm. Recent follow-up with cardiology 3/10, with instructions to increase diuretic therapy along with amiodarone.  On his initial physical examination blood pressure 141/87, heart rate 116, respiratory rate 27, oxygen saturation 88%.  His lungs had bibasilar rales but no wheezing, heart S1-S2, tachycardic, abdomen soft, +++ bilateral extremity edema.  Chest radiograph with hilar vascular congestion, patchy right lower lobe infiltrate.  EKG 120 bpm, normal axis, QTC 512, sinus rhythm, no significant ST segment or T wave changes.  Patient developed SVT/ flutter 220 bpm, refractive to adenosine, he was placed on amiodarone infusion and underwent electrical cardioversion on 3/30.  Amiodarone had to be discontinued due to medication induced lung injury.  Continue to have high oxygen requirements on ambulation up to 10 L to 15 L/min to keep oxygenation more than 88%    Assessment & Plan:   Principal Problem:   Acute on chronic diastolic CHF (congestive heart failure)  (HCC) Active Problems:   Type 2 diabetes mellitus without complication, without long-term current use of insulin (HCC)   Mild persistent asthma   CAD in native artery   A-fib (HCC)   Acute hypoxemic respiratory failure (HCC)   Syncope and collapse   Acute-on-chronic kidney injury (HCC)   Amiodarone pulmonary toxicity    1.Acute decompensation of diastolic heart failure, complicated with acutepulmonary edema and acute hypoxic respiratory failure. Echocardiography showed inferior basal hypokinesis of the left ventricle, ejection fraction 50 to 55%. RV systolic function preserved. Normal pulmonary artery systolic pressure. Moderately dilation of left and right atrium. Known moderate mitral regurgitation. Mild dilation of the aortic root, 40 mm.  Heart failure management with carvedilol and sacubitril/valsartan Continue with Empagliflozin.  Plan to start torsemide on 04/10.   2.SVT/atrial fibrillation/flutter, with RVR. (Paroxysmal).sp electrical cardioversion.  Continue rate control with carvedilol and anticoagulation with apixaban.  3.Amiodarone lung toxicity/history of asthma. At rest with oxygenation 93% on 4 L/min per [place], on ambulation has increased oxygen requirements.   Will ambulate today, goal to maintain oxygenation 88% or greater with no more than 8 L/min (maximal supplemental oxygen that oxygen company can provide).  Patient will have to limit his mobility to achieve this goal, at least initially and then progressively increase with exertion.   On bronchodilator therapy. Continue slow taper of prednisone by 10 mg ever 3 weeks, started on 08/17/20  4. AKI on chronic kidney disease stage IIIb.renal function with serum cr at 1,93 with K at 4,0 and serum bicarbonate at 29. Continue to hold diuresis for now, plan to start torsemide on 04/10  5.Coronary artery disease (hx angioplasty and stent).On carvedilol, clopidogrel, apixaban and  Ranexa.  6.Type 2 diabetes mellitus uncontrolled with hyperglycemia/ steroid  induced hyperglycemia. Fasting glucose this am 272, capillary has been [phone] Increase basal insulin to 30 units and pre-meal to 5 units.    Status is: Inpatient  Remains inpatient appropriate because:Inpatient level of care appropriate due to severity of illness   Dispo: The patient is from: Home              Anticipated d/c is to: Home              Patient currently is not medically stable to d/c.   Difficult to place patient No   DVT prophylaxis: apixaban   Code Status:   full  Family Communication:  No family at the bedside    Torsemide 20mg  daily start 4/10, prednisone 60mg  daily started 4/5 decrease by 10mg  every 3 weeks, Bactrim DS 1 tab MWF while on prednisone - thx     Consultants:   Cardiology   Pulmonary   Procedures:   Cardioversion   Subjective: Patient is feeling better but not yet back to baseline, continue to have dyspnea on exertion and oxygen desaturation,. No nausea or vomiting, no chest pain.   Objective: Vitals:   08/19/20 0400 08/19/20 0412 08/19/20 0800 08/19/20 1200  BP: (!) 147/82  (!) 127/59 124/68  Pulse: 63  77 69  Resp: 20  17 18   Temp: 97.8 F (36.6 C)  97.7 F (36.5 C) 97.7 F (36.5 C)  TempSrc: Oral  Oral Oral  SpO2: 94%  93% 93%  Weight:  118.6 kg    Height:        Intake/Output Summary (Last 24 hours) at 08/19/2020 1249 Last data filed at 08/19/2020 1237 Gross per 24 hour  Intake 360 ml  Output 5350 ml  Net -4990 ml   Filed Weights   08/17/20 0525 08/18/20 0418 08/19/20 0412  Weight: 121.2 kg 120.9 kg 118.6 kg    Examination:   General: Not in pain or dyspnea, deconditioned  Neurology: Awake and alert, non focal  E ENT: mild pallor, no icterus, oral mucosa moist Cardiovascular: No JVD. S1-S2 present, rhythmic, no gallops, rubs, or murmurs. No lower extremity edema. Pulmonary: positive breath sounds bilaterally, with no wheezing,  rhonchi or rales. Gastrointestinal. Abdomen soft and non tender Skin. No rashes Musculoskeletal: no joint deformities     Data Reviewed: I have personally reviewed following labs and imaging studies  CBC: Recent Labs  Lab 08/14/20 0057 08/16/20 0222 08/18/20 0032  WBC 7.8  --  14.5*  HGB 12.9* 13.2 13.0  HCT 39.2 38.8* 39.0  MCV 94.5  --  92.0  PLT 541*  --  541*   Basic Metabolic Panel: Recent Labs  Lab 08/16/20 0222 08/16/20 1058 08/17/20 0026 08/17/20 0835 08/18/20 0032 08/19/20 0035  NA 136  --  135 134* 134* 135  K 5.9* 4.3 4.4 4.2 4.1 4.0  CL 97*  --  98 96* 99 99  CO2 27  --  29 27 27 29   GLUCOSE 297*  --  261* 320* 257* 272*  BUN 59*  --  62* 63* 58* 59*  CREATININE 1.74*  --  1.77* 1.85* 1.69* 1.93*  CALCIUM 8.9  --  8.9 9.0 9.0 8.5*  MG  --   --   --  3.0* 3.0*  --    GFR: Estimated Creatinine Clearance: 41.3 mL/min (A) (by C-G formula based on SCr of 1.93 mg/dL (H)). Liver Function Tests: No results for input(s): AST, ALT, ALKPHOS, BILITOT, PROT, ALBUMIN in the last 168 hours. No  results for input(s): LIPASE, AMYLASE in the last 168 hours. No results for input(s): AMMONIA in the last 168 hours. Coagulation Profile: No results for input(s): INR, PROTIME in the last 168 hours. Cardiac Enzymes: No results for input(s): CKTOTAL, CKMB, CKMBINDEX, TROPONINI in the last 168 hours. BNP (last 3 results) Recent Labs    12/17/19 1327  PROBNP 593.0*   HbA1C: No results for input(s): HGBA1C in the last 72 hours. CBG: Recent Labs  Lab 08/18/20 1601 08/18/20 2115 08/19/20 0624 08/19/20 0902 08/19/20 1134  GLUCAP 256* 251* 216* 329* 265*   Lipid Profile: No results for input(s): CHOL, HDL, LDLCALC, TRIG, CHOLHDL, LDLDIRECT in the last 72 hours. Thyroid Function Tests: No results for input(s): TSH, T4TOTAL, FREET4, T3FREE, THYROIDAB in the last 72 hours. Anemia Panel: No results for input(s): VITAMINB12, FOLATE, FERRITIN, TIBC, IRON, RETICCTPCT in  the last 72 hours.    Radiology Studies: I have reviewed all of the imaging during this hospital visit personally     Scheduled Meds: . apixaban  5 mg Oral BID  . atorvastatin  80 mg Oral QHS  . calcium-vitamin D  1 tablet Oral BID  . carvedilol  18.75 mg Oral BID WC  . clopidogrel  75 mg Oral Daily  . empagliflozin  10 mg Oral Daily  . ezetimibe  10 mg Oral QHS  . icosapent Ethyl  2 g Oral BID  . insulin aspart  0-5 Units Subcutaneous QHS  . insulin aspart  0-9 Units Subcutaneous TID WC  . insulin aspart  4 Units Subcutaneous TID WC  . insulin glargine  25 Units Subcutaneous Daily  . loratadine  10 mg Oral Daily  . mouth rinse  15 mL Mouth Rinse BID  . mometasone-formoterol  2 puff Inhalation BID  . pantoprazole  40 mg Oral QHS  . predniSONE  60 mg Oral Q breakfast  . ranolazine  500 mg Oral BID  . sacubitril-valsartan  1 tablet Oral BID  . sulfamethoxazole-trimethoprim  1 tablet Oral Once per day on Mon Wed Fri   Continuous Infusions:   LOS: 10 days        Cori Henningsen Annett Gula, MD

## 2020-08-19 NOTE — Telephone Encounter (Signed)
They would like to know if you would sign off on PT and OT

## 2020-08-20 LAB — BASIC METABOLIC PANEL
Anion gap: 9 (ref 5–15)
BUN: 60 mg/dL — ABNORMAL HIGH (ref 8–23)
CO2: 29 mmol/L (ref 22–32)
Calcium: 8.7 mg/dL — ABNORMAL LOW (ref 8.9–10.3)
Chloride: 100 mmol/L (ref 98–111)
Creatinine, Ser: 1.88 mg/dL — ABNORMAL HIGH (ref 0.61–1.24)
GFR, Estimated: 36 mL/min — ABNORMAL LOW (ref >60)
Glucose, Bld: 189 mg/dL — ABNORMAL HIGH (ref 70–99)
Potassium: 3.9 mmol/L (ref 3.5–5.1)
Sodium: 138 mmol/L (ref 135–145)

## 2020-08-20 LAB — GLUCOSE, CAPILLARY
Glucose-Capillary: 194 mg/dL — ABNORMAL HIGH (ref 70–99)
Glucose-Capillary: 145 mg/dL — ABNORMAL HIGH (ref 70–99)
Glucose-Capillary: 203 mg/dL — ABNORMAL HIGH (ref 70–99)

## 2020-08-20 MED ORDER — SULFAMETHOXAZOLE-TRIMETHOPRIM 800-160 MG PO TABS: 1.0000 | ORAL_TABLET | ORAL | 0 refills | Status: DC

## 2020-08-20 MED ORDER — TORSEMIDE 20 MG PO TABS: 20.0000 mg | ORAL_TABLET | Freq: Every morning | ORAL | 0 refills | Status: DC

## 2020-08-20 MED ORDER — PREDNISONE 10 MG PO TABS: ORAL_TABLET | ORAL | 0 refills | Status: DC

## 2020-08-20 NOTE — TOC Initial Note (Signed)
Transition of Care [facility]) - Initial/Assessment Note    Patient Details  Name: David Huynh MRN: [id number] Date of Birth: 06/01/1940  [facility]) CM/SW Contact:    Leone Haven, RN Phone Number: 08/20/2020, 11:41 AM  Clinical Narrative:                 Patient is for dc today to [facility] IDL, he has a rollator at home.  He is set up with [facility] for [facility], HHPT, HHOT.  He is requiring a lot of oxygen so HHRN will be checking on him for respiratory.  He has transportation home today.  The concentrator will have to be at the home before he gets there due to him being on large amount of oxygen.   Expected Discharge Plan: Home w Home Health Services Barriers to Discharge: No Barriers Identified   Patient Goals and CMS Choice Patient states their goals for this hospitalization and ongoing recovery are:: return home CMS Medicare.gov Compare Post Acute Care list provided to:: Patient Choice offered to / list presented to : Patient  Expected Discharge Plan and Services Expected Discharge Plan: Home w Home Health Services   Discharge Planning Services: CM Consult Post Acute Care Choice: Durable Medical Equipment,Home Health Living arrangements for the past 2 months: Independent Living Facility Expected Discharge Date: 08/20/20               DME Arranged: Oxygen DME Agency: [employer] Date DME Agency Contacted: 08/19/20 Time DME Agency Contacted: 1600 Representative spoke with at DME Agency: Ian Malkin HH Arranged: RN,PT,OT HH Agency:  ([facility]) Date HH Agency Contacted: 08/19/20 Time HH Agency Contacted: 1300 Representative spoke with at [facility] Agency: Eber Jones  Prior Living Arrangements/Services Living arrangements for the past 2 months: Marketing executive Lives with:: Spouse Patient language and need for interpreter reviewed:: Yes Do you feel safe going back to the place where you live?: Yes      Need for Family Participation in  Patient Care: Yes (Comment) Care giver support system in place?: Yes (comment) Current home services: DME (rollator) Criminal Activity/Legal Involvement Pertinent to Current Situation/Hospitalization: No - Comment as needed  Activities of Daily Living      Permission Sought/Granted                  Emotional Assessment Appearance:: Appears stated age Attitude/Demeanor/Rapport: Engaged Affect (typically observed): Appropriate Orientation: : Oriented to Self,Oriented to Place,Oriented to  Time,Oriented to Situation Alcohol / Substance Use: Not Applicable Psych Involvement: No (comment)  Admission diagnosis:  Community acquired pneumonia [J18.9] Acute CHF (congestive heart failure) (HCC) [I50.9] Near syncope [R55] Community acquired pneumonia, unspecified laterality [J18.9] Patient Active Problem List   Diagnosis Date Noted  . Acute on chronic diastolic CHF (congestive heart failure) (HCC) 08/18/2020  . 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  . Acute CHF (congestive heart failure) (HCC) 08/10/2020  . Community acquired pneumonia 08/09/2020  . Syncope and collapse 08/09/2020  . Acute-on-chronic kidney injury (HCC) 08/09/2020  . Type 2 diabetes mellitus with diabetic polyneuropathy, without long-term current use of insulin (HCC) 02/24/2020  . Type 2 diabetes mellitus with stage 3b chronic kidney disease, without long-term current use of insulin (HCC) 02/24/2020  . Diabetes mellitus (HCC) 02/24/2020  . Acute on chronic combined systolic (congestive) and diastolic (congestive) heart failure (HCC) 12/06/2019  . Acute hypoxemic respiratory failure (HCC) 12/06/2019  . A-fib (HCC) 03/25/2018  . CAD in native artery   . Aortic stenosis, mild 06/28/2016  .  Cardiomyopathy, ischemic 02/17/2016  . Acute on chronic combined systolic and diastolic CHF (congestive heart failure) (HCC) 02/17/2016  . Dyspnea   . Hypertensive heart disease   . Hypocalcemia 02/12/2015  . Fracture of  multiple ribs 02/12/2015  . Other specified forms of effusion, except tuberculous   . Essential hypertension   . Other and unspecified hyperlipidemia   . Liver function study, abnormal 04/26/2011  . Obesity   . Hyperlipidemia with target LDL less than 70   . Type 2 diabetes mellitus without complication, without long-term current use of insulin (HCC)   . Mild persistent asthma   . Allergic state    PCP:  Bradd Canary, MD Pharmacy:   [facility] [address] [place] - [address] AT [facility] [address] [place] [address] Phone: [phone] Fax: [phone]     Social Determinants of Health (SDOH) Interventions    Readmission Risk Interventions Readmission Risk Prevention Plan 08/20/2020  Transportation Screening Complete  PCP or Specialist Appt within 3-5 Days Complete  HRI or Home Care Consult Complete  Social Work Consult for Recovery Care Planning/Counseling Complete  Palliative Care Screening Not Applicable  Medication Review Oceanographer) Complete  Some recent data might be hidden

## 2020-08-20 NOTE — Plan of Care (Signed)
  Problem: Education: Goal: Knowledge of General Education information will improve Description: Including pain rating scale, medication(s)/side effects and non-pharmacologic comfort measures 08/20/2020 1340 by Sharol Given, RN Outcome: Adequate for Discharge 08/20/2020 1339 by Sharol Given, RN Outcome: Adequate for Discharge   Problem: Clinical Measurements: Goal: Ability to maintain clinical measurements within normal limits will improve 08/20/2020 1340 by Sharol Given, RN Outcome: Adequate for Discharge 08/20/2020 1339 by Sharol Given, RN Outcome: Adequate for Discharge Goal: Will remain free from infection 08/20/2020 1340 by Sharol Given, RN Outcome: Adequate for Discharge 08/20/2020 1339 by Sharol Given, RN Outcome: Adequate for Discharge Goal: Diagnostic test results will improve 08/20/2020 1340 by Sharol Given, RN Outcome: Adequate for Discharge 08/20/2020 1339 by Sharol Given, RN Outcome: Adequate for Discharge Goal: Respiratory complications will improve 08/20/2020 1340 by Sharol Given, RN Outcome: Adequate for Discharge 08/20/2020 1339 by Sharol Given, RN Outcome: Adequate for Discharge Goal: Cardiovascular complication will be avoided 08/20/2020 1340 by Sharol Given, RN Outcome: Adequate for Discharge 08/20/2020 1339 by Sharol Given, RN Outcome: Adequate for Discharge   Problem: Activity: Goal: Risk for activity intolerance will decrease 08/20/2020 1340 by Sharol Given, RN Outcome: Adequate for Discharge 08/20/2020 1339 by Sharol Given, RN Outcome: Adequate for Discharge   Problem: Nutrition: Goal: Adequate nutrition will be maintained 08/20/2020 1340 by Sharol Given, RN Outcome: Adequate for Discharge 08/20/2020 1339 by Sharol Given, RN Outcome: Adequate for Discharge   Problem: Coping: Goal: Level of anxiety will decrease 08/20/2020 1340 by Sharol Given, RN Outcome: Adequate for Discharge 08/20/2020 1339 by Sharol Given, RN Outcome: Adequate for Discharge   Problem: Elimination: Goal: Will not experience complications related to bowel motility 08/20/2020 1340 by Sharol Given, RN Outcome: Adequate for Discharge 08/20/2020 1339 by Sharol Given, RN Outcome: Adequate for Discharge Goal: Will not experience complications related to urinary retention 08/20/2020 1340 by Sharol Given, RN Outcome: Adequate for Discharge 08/20/2020 1339 by Sharol Given, RN Outcome: Adequate for Discharge   Problem: Pain Managment: Goal: General experience of comfort will improve 08/20/2020 1340 by Sharol Given, RN Outcome: Adequate for Discharge 08/20/2020 1339 by Sharol Given, RN Outcome: Adequate for Discharge   Problem: Safety: Goal: Ability to remain free from injury will improve 08/20/2020 1340 by Sharol Given, RN Outcome: Adequate for Discharge 08/20/2020 1339 by Sharol Given, RN Outcome: Adequate for Discharge   Problem: Skin Integrity: Goal: Risk for impaired skin integrity will decrease 08/20/2020 1340 by Sharol Given, RN Outcome: Adequate for Discharge 08/20/2020 1339 by Sharol Given, RN Outcome: Adequate for Discharge   Problem: Acute Rehab PT Goals(only PT should resolve) Goal: Pt Will Go Supine/Side To Sit Outcome: Adequate for Discharge Goal: Patient Will Transfer Sit To/From Stand Outcome: Adequate for Discharge Goal: Pt Will Ambulate Outcome: Adequate for Discharge Goal: Pt/caregiver will Perform Home Exercise Program Outcome: Adequate for Discharge

## 2020-08-20 NOTE — TOC Transition Note (Addendum)
Transition of Care [facility]) - CM/SW Discharge Note   Patient Details  Name: David Huynh MRN: [id number] Date of Birth: 01-02-1941  [facility] CM/SW Contact:  Leone Haven, RN Phone Number: 08/20/2020, 11:48 AM   Clinical Narrative:    Patient is for dc today to [facility] IDL, he has a rollator at home.  He is set up with [facility] for [facility], HHPT, HHOT.  He is requiring a lot of oxygen so HHRN will be checking on him for respiratory.  He has transportation home today.  The concentrator will have to be at the home before he gets there due to him being on large amount of oxygen.  14:19 Per Ian Malkin with [employer] [facility] is at patient's home .  NCM notified April Staff RN to let [place] know.   Final next level of care: Home w Home Health Services Barriers to Discharge: No Barriers Identified   Patient Goals and CMS Choice Patient states their goals for this hospitalization and ongoing recovery are:: return home CMS Medicare.gov Compare Post Acute Care list provided to:: Patient Choice offered to / list presented to : Patient  Discharge Placement                       Discharge Plan and Services   Discharge Planning Services: CM Consult Post Acute Care Choice: Durable Medical Equipment,Home Health          DME Arranged: Oxygen DME Agency: [employer] Date DME Agency Contacted: 08/19/20 Time DME Agency Contacted: 1600 Representative spoke with at DME Agency: Ian Malkin HH Arranged: RN,PT,OT HH Agency:  ([facility] home health) Date HH Agency Contacted: 08/19/20 Time HH Agency Contacted: 1300 Representative spoke with at [facility] Agency: Eber Jones  Social Determinants of Health (SDOH) Interventions     Readmission Risk Interventions Readmission Risk Prevention Plan 08/20/2020  Transportation Screening Complete  PCP or Specialist Appt within 3-5 Days Complete  HRI or Home Care Consult Complete  Social Work Consult for Recovery Care Planning/Counseling Complete   Palliative Care Screening Not Applicable  Medication Review Oceanographer) Complete  Some recent data might be hidden

## 2020-08-20 NOTE — Progress Notes (Signed)
Physical Therapy Treatment Patient Details Name: David Huynh MRN: [id number] DOB: 12/31/1940 Today's Date: 08/20/2020    History of Present Illness 80 yo admitted 3/28 with SOb, bil LE edema, and syncope. 3/29 SVT, 3/30 DCCV. Pt diuresed for CHF and also with Rt pleural effusion and respiratory failure. PMhx: CAD s/p PCI, Afib, CHF, CKD, DM, HLD, HTN    PT Comments    Pt pleasant and eager to return home. Pt with bad knee which makes slow gait difficult. Pt on 4L [place] at rest with sats 92% with drop to 88% on 8L with transition to EOB. 10L for standing and all gait with sats 87-88% and brief drop to 86% at end of gait with 4 standing rest breaks and no talking. Pt with 7 min recovery in sitting to return to 6L with RN present and aware for continued titration. Discussed activity modification and energy conservation at home as well as need for humidification and frequent stops. Encouraged pt to walk again with nursing later with use of rollator and take a seated rest every 30' to see if SpO2 can be maintained >88% on 10L as pt has rollator for home use. Will continue to follow.  HR 91-97     Follow Up Recommendations  Home health PT     Equipment Recommendations  None recommended by PT    Recommendations for Other Services       Precautions / Restrictions Precautions Precautions: Fall Precaution Comments: sats >88% on 10L is goal for D/C    Mobility  Bed Mobility Overal bed mobility: Modified Independent             General bed mobility comments: bed flat    Transfers Overall transfer level: Modified independent               General transfer comment: pt able to stand without assist with decreased control of descent but improved from last session and need for UE assist  Ambulation/Gait Ambulation/Gait assistance: Supervision Gait Distance (Feet): 110 Feet Assistive device: Rolling walker (2 wheeled) Gait Pattern/deviations: Step-through pattern;Decreased  stride length   Gait velocity interpretation: <1.31 ft/sec, indicative of household ambulator General Gait Details: cues for posture, position in RW and safety. Pt not talking throughout session with cues for breathing technique, very slow gait speed and standing rests 30-60 sec every 40'. Pt with sats 87-88% during gait with all techniques applied. pt reports functional distance bed to bath is 10' at home and 49' to living room. 2 min seated rest at 10 L to achieve 92% and 7 min to be able to titrate O2 to 6L at rest   Stairs             Wheelchair Mobility    Modified Rankin (Stroke Patients Only)       Balance Overall balance assessment: Needs assistance   Sitting balance-Leahy Scale: Good Sitting balance - Comments: EOB without assist   Standing balance support: Bilateral upper extremity supported;During functional activity Standing balance-Leahy Scale: Poor Standing balance comment: bil UE support on RW                            Cognition Arousal/Alertness: Awake/alert Behavior During Therapy: WFL for tasks assessed/performed Overall Cognitive Status: Within Functional Limits for tasks assessed  Exercises      General Comments        Pertinent Vitals/Pain Pain Assessment: No/denies pain    Home Living                      Prior Function            PT Goals (current goals can now be found in the care plan section) Progress towards PT goals: Progressing toward goals    Frequency    Min 3X/week      PT Plan Current plan remains appropriate    Co-evaluation              AM-PAC PT "6 Clicks" Mobility   Outcome Measure  Help needed turning from your back to your side while in a flat bed without using bedrails?: None Help needed moving from lying on your back to sitting on the side of a flat bed without using bedrails?: None Help needed moving to and from a bed to  a chair (including a wheelchair)?: A Little Help needed standing up from a chair using your arms (e.g., wheelchair or bedside chair)?: A Little Help needed to walk in hospital room?: A Little Help needed climbing 3-5 steps with a railing? : A Lot 6 Click Score: 19    End of Session Equipment Utilized During Treatment: Oxygen Activity Tolerance: Patient tolerated treatment well Patient left: in chair;with call bell/phone within reach;with nursing/sitter in room Nurse Communication: Mobility status;Other (comment) (respiratory function) PT Visit Diagnosis: Other abnormalities of gait and mobility (R26.89);Difficulty in walking, not elsewhere classified (R26.2);Muscle weakness (generalized) (M62.81)     Time: 8563-1497 PT Time Calculation (min) (ACUTE ONLY): 23 min  Charges:  $Gait Training: 8-22 mins $Therapeutic Activity: 8-22 mins                     Nataki Mccrumb P, PT Acute Rehabilitation Services Pager: [phone] Office: [phone]    Rashada Klontz B Davarion Cuffee 08/20/2020, 8:20 AM

## 2020-08-20 NOTE — Progress Notes (Signed)
SATURATION QUALIFICATIONS: (This note is used to comply with regulatory documentation for home oxygen)  Patient Saturations on Room Air at Rest = 87%  Patient Saturations on Room Air while Ambulating = 000%  Patient Saturations on 10 Liters of oxygen while Ambulating = 87%  Please briefly explain why patient needs home oxygen:  Patient oxygen removed for 5 minutes patient sats dropped to 87% at rest and was replaced.  Patient saturation returned to 90 % after 1 full minute not talking and concentrating on breathing through her nose.

## 2020-08-20 NOTE — Telephone Encounter (Signed)
David Huynh that we will sign off but pt will need to be seen within 30 days.

## 2020-08-20 NOTE — Care Management Important Message (Signed)
Important Message  Patient Details  Name: David Huynh MRN: [id number] Date of Birth: 05/06/1941   Medicare Important Message Given:  Yes     Dorena Bodo 08/20/2020, 2:18 PM

## 2020-08-20 NOTE — Progress Notes (Signed)
SATURATION QUALIFICATIONS: (This note is used to comply with regulatory documentation for home oxygen)  Patient Saturations on 4L at Rest = 92%  Patient Saturations on 10 Liters of oxygen while Ambulating = 87%  Please briefly explain why patient needs home oxygen: pt requires oxygen at rest 4-6L and 10L with all activity to maintain sats >85%  Merryl Hacker, PT Acute Rehabilitation Services Pager: [phone] Office: [phone]

## 2020-08-20 NOTE — Progress Notes (Addendum)
Patient ID: David Huynh, male   DOB: 1940/10/24, 80 y.o.   MRN: [id number]     Advanced Heart Failure Rounding Note  PCP-Cardiologist: Loralie Champagne, MD   Subjective:    DCCV to NSR on 3/30.  Echo showed EF still in 50-55% range (stable) with normal RV, moderate biatrial enlargement, mild MR, moderate TR, mild AS.   CT chest with patchy consolidation consistent with PNA versus amiodarone toxicity. ESR 130.  Creatinine 1.85 => 1.69 => 1.93 => 1.88.   Started on solumedrol for amiodarone toxicity, now on prednisone.    He did not go home yesterday due to requiring too much oxygen with ambulation.  This morning, oxygen saturation in 94% on 4L [place] at rest.   Objective:   Weight Range: 117.2 kg Body mass index is 35.04 kg/m.   Vital Signs:   Temp:  [97.3 F (36.3 C)-97.9 F (36.6 C)] 97.7 F (36.5 C) (04/08 0324) Pulse Rate:  [65-77] 67 (04/08 0324) Resp:  [13-19] 19 (04/08 0324) BP: (89-139)/(59-79) 89/59 (04/08 0324) SpO2:  [92 %-94 %] 92 % (04/08 0324) Weight:  [117.2 kg] 117.2 kg (04/08 0322) Last BM Date: 08/18/20  Weight change: Filed Weights   08/18/20 0418 08/19/20 0412 08/20/20 0322  Weight: 120.9 kg 118.6 kg 117.2 kg    Intake/Output:   Intake/Output Summary (Last 24 hours) at 08/20/2020 0746 Last data filed at 08/20/2020 0000 Gross per 24 hour  Intake 400 ml  Output 4100 ml  Net -3700 ml      Physical Exam   General: NAD Neck: No JVD, no thyromegaly or thyroid nodule.  Lungs: Mild crackles at bases CV: Nondisplaced PMI.  Heart regular S1/S2, no S3/S4, no murmur.  No peripheral edema.  Abdomen: Soft, nontender, no hepatosplenomegaly, no distention.  Skin: Intact without lesions or rashes.  Neurologic: Alert and oriented x 3.  Psych: Normal affect. Extremities: No clubbing or cyanosis.  HEENT: Normal.    Telemetry   NSR 70s (personally reviewed)    Labs    CBC Recent Labs    08/18/20 0032  WBC 14.5*  HGB 13.0  13.1  HCT 39.0  MCV  92.0  PLT 759*   Basic Metabolic Panel Recent Labs    08/17/20 0835 08/18/20 0032 08/19/20 0035 08/20/20 0018  NA 134* 134* 135 138  K 4.2 4.1 4.0 3.9  CL 96* 99 99 100  CO2 $Re'27 27 29 29  'wkq$ GLUCOSE 320* 257* 272* 189*  BUN 63* 58* 59* 60*  CREATININE 1.85* 1.69* 1.93* 1.88*  CALCIUM 9.0 9.0 8.5* 8.7*  MG 3.0* 3.0*  --   --    Liver Function Tests No results for input(s): AST, ALT, ALKPHOS, BILITOT, PROT, ALBUMIN in the last 72 hours. No results for input(s): LIPASE, AMYLASE in the last 72 hours. Cardiac Enzymes No results for input(s): CKTOTAL, CKMB, CKMBINDEX, TROPONINI in the last 72 hours.  BNP: BNP (last 3 results) Recent Labs    12/06/19 1551 08/09/20 1643  BNP 792.4* 339.4*    ProBNP (last 3 results) Recent Labs    12/17/19 1327  PROBNP 593.0*     D-Dimer No results for input(s): DDIMER in the last 72 hours. Hemoglobin A1C No results for input(s): HGBA1C in the last 72 hours. Fasting Lipid Panel No results for input(s): CHOL, HDL, LDLCALC, TRIG, CHOLHDL, LDLDIRECT in the last 72 hours. Thyroid Function Tests No results for input(s): TSH, T4TOTAL, T3FREE, THYROIDAB in the last 72 hours.  Invalid input(s): FREET3  Other  results:   Imaging    No results found.   Medications:     Scheduled Medications: . apixaban  5 mg Oral BID  . atorvastatin  80 mg Oral QHS  . calcium-vitamin D  1 tablet Oral BID  . carvedilol  18.75 mg Oral BID WC  . clopidogrel  75 mg Oral Daily  . empagliflozin  10 mg Oral Daily  . ezetimibe  10 mg Oral QHS  . icosapent Ethyl  2 g Oral BID  . insulin aspart  0-5 Units Subcutaneous QHS  . insulin aspart  0-9 Units Subcutaneous TID WC  . insulin aspart  5 Units Subcutaneous TID WC  . insulin glargine  30 Units Subcutaneous Daily  . loratadine  10 mg Oral Daily  . mouth rinse  15 mL Mouth Rinse BID  . mometasone-formoterol  2 puff Inhalation BID  . pantoprazole  40 mg Oral QHS  . predniSONE  60 mg Oral Q breakfast   . ranolazine  500 mg Oral BID  . sacubitril-valsartan  1 tablet Oral BID  . sulfamethoxazole-trimethoprim  1 tablet Oral Once per day on Mon Wed Fri    Infusions:   PRN Medications:      Assessment/Plan   1. Acute on chronic systolic CHF: Prior ischemic cardiomyopathy, improved after PCI in 2017. Echo 03/2018 with EF back down to 35-40% and moderate-severe RV dysfunction. This was in the setting of atrial fibrillation with RVR. EF 40% on TEE 04/2018.  Echo was done in 3/20, showing EF up to 50%.  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  Echo in 7/21 with EF 25-30%, severe RV dysfunction.  TEE in 10/21, however, showed EF up to 50%.  He had PCI to LAD in 10/21.  Echo 08/12/20 with EF stable 50-55%, normal RV.  He is now back in NSR.  Volume status ok.  Creatinine lower at 1.88.  - Hold torsemide x 2 days, restart at 20 mg daily on 4/10.  - Continue Entresto 49/51 bid.   - Continue Coreg 18.75 mg bid.  - Continue empagliflozin 10 mg daily.  2. Atrial fibrillation/Atrial flutter: As above, concerned for possible tachy-mediated CMP in past. Had successful DC-CV on 03/29/18 but then back in atrial fibrillation.Ranolazine added, but failed DCCV again 04/02/18. DCCV 04/19/18 was successful and he remained in NSR on amiodarone + ranolazine. He opted against atrial fibrillation ablation in the past (saw Dr. Rayann Heman).  He was noted to be in atrial fibrillation at last clinic visit and plan was made for DCCV if he did not convert, but he was admitted with syncope and rapid SVT in 200s.  SVT was regular, ?1:1 atrial flutter.  In atrial flutter rate 100s on amiodarone gtt => DCCV to NSR on 08/11/20. NSR today. Amiodarone stopped due to concern for amiodarone toxicity. ESR 130 - Continue ranolazine 500 mg bid.  - Continue Coreg as above.  - Continue apixaban.   - Discussed with EP (Dr. Curt Bears).  He recommends DCCV only this admission, then close followup with Dr. Rayann Heman  as patient is going to need atrial fibrillation + atrial flutter ablation.  Will need healing of lung process prior to ablation.  - If he needs anti-arrhythmic in future, Tikosyn would be option but needs amiodarone wash-out.  3. CKD: Stage 3.  Creatinine trending down.  - Change torsemide as above.  4. CAD: History of DES to RCA in 9/17. UnderwentLHC11/20/19 with PCI to prox left circ.LHC in  10/21 with PCI to LAD. No chest pain. -He is on Eliquis so no ASA.   - Continue Plavix ideally x 1 yr (to 10/22).  - Continue atorvastatin and vascepa.  5. DM2: Per primary.  6. Aortic stenosis: Mild by echo this admission.   7. Mitral regurgitation: Mild by echo this admission.  8. Acute Hypoxic Respiratory Failure: CT chest with patchy consolidation c/w PNA versus amiodarone toxicity.  No fever, normal PCT and WBCs. ESR 130.  Strong suspicion for amiodarone toxicity.  - Off abx, do not think bacterial PNA.  - HFNC down to 4 liters today.  Needs to be able to walk on </= 10 L oxygen with saturation >/= 88%.  - Amiodarone stopped and Solumedrol IV begun. Transitioned to prednisone.  Pulmonary following.  Appreciate their input. Will need long steroid taper.  -  Bactrim for PJP prophylaxis.  9. Disposition: From my standpoint, he could go home today if oxygen saturation with ambulation is adequate as above.  He will need home oxygen.  He will need followup with pulmonary, CHF clinic, and Dr. Rayann Heman (EP).  Meds for home: torsemide 20 mg daily to start on 08/22/20, Coreg 18.75 mg bid, Entresto 49/51 bid, atorvastatin 80 daily, apixaban 5 mg bid, Plavix 75 daily, Zetia 10, empagliflozin 10, prednisone taper, Bactrim, ranolazine 500 bid, vascepa 2 g bid.   Loralie Champagne 08/20/2020 7:46 AM

## 2020-08-20 NOTE — Discharge Summary (Signed)
Physician Discharge Summary  David Huynh:[id number] DOB: November 07, 1940 DOA: 08/09/2020  PCP: David Canary, MD  Admit date: 08/09/2020 Discharge date: 08/20/2020  Admitted From: Home  Disposition:  Home   Recommendations for Outpatient Follow-up and new medication changes:  1. Follow up with Dr. Abner Greenspan in 7 days.  2. Restart torsemide 20 mg on 08/22/20 3. Continue prednisone taper by 10 mg every 3 weeks, started on 08/17/20.  4. Discontinue amiodarone   Home Health: yes   Equipment/Devices: home 02    Discharge Condition: stable  CODE STATUS: full  Diet recommendation: heart healthy and diabetic prudent.   Brief/Interim Summary: David Huynh admitted to the hospitalwith theworking diagnosis of acute decompensationof diastolic heart failure, complicated with atrial fibrillation/flutter with rapid ventricular response and acute hypoxic respiratory failure. Amiodarone induced lung injury.   80 year old male past medical history for coronary artery disease, heart failure, paroxysmal atrial fibrillation, hypertension, dyslipidemia, type II diabetes mellitus, moderate aortic stenosis and chronic kidney disease stage III who presented with a syncope episode. He reported 48 hours of dyspnea, initially on exertion, progressing to symptoms with minimal efforts. On the day of admission he stood up from his bed, felt dizzy, and lightheaded, he perceived palpitations and he lost his consciousness. When EMS arrived he was in atrial fibrillation/flutter 100-120 bpm. Recent follow-up with cardiology 3/10, with instructions to increase diuretic therapy along with amiodarone.  On his initial physical examination blood pressure 141/87, heart rate 116, respiratory rate 27, oxygen saturation 88%. His lungs had bibasilar rales but no wheezing, heart S1-S2, tachycardic, abdomen soft, +++bilateral extremity edema.  Sodium 137, potassium 4.2, chloride 105, bicarb 23, glucose 187, BUN 21,  creatinine 1.73, white count 10.7, hemoglobin 11.5, hematocrit 35.0, platelets 394. SARS COVID-19 negative.Chest radiograph with hilar vascular congestion, patchy right lower lobe infiltrate.  His chest radiograph showed cardiomegaly, hilar vascular congestion bilaterally, bilateral, lower lobe infiltrates, more dense on the right, bilateral pleural effusions.  EKG 120 bpm, normal axis, QTC 512, sinus rhythm, no significant ST segment or T wave changes.  Patient developed SVT/ flutter 220 bpm, refractivetoadenosine, he was placed on amiodarone infusion and underwent electrical cardioversion on 3/30.  Patient with persistent hypoxic respiratory failure, further work-up with CT chest showed patchy bilateral infiltrates, predominantly at lower lobes.  Amiodarone had to be discontinued due to medication induced lung injury, and started on systemic steroids.   Continue to have increased oxygen requirements on ambulation up to 10 Lto  keep oxygenation more than 88%.  Patient will have close monitoring at home and follow up as outpatient.   1.Acute decompensation of diastolic heart failure, complicated with acutepulmonary edema and acute hypoxic respiratory failure. Patient was admitted to the progressive care unit, he received intravenous furosemide along with metolazone for aggressive diuresis. Anegative fluid balance was achieved (-23,745 mL since admission)with significant improvement of his symptoms.  Further work-up with echocardiography showed inferior basal hypokinesis of the left ventricle, ejection fraction 50 to 55%. RV systolic function preserved. Normal pulmonary artery systolic pressure. Moderately dilation of left and right atrium. Known moderate mitral regurgitation. Mild dilation of the aortic root, 40 mm.  Continue heart failure management with carvedilol, sacubitril/valsartan and diuresis with torsemide. Empagliflozin.   2.SVT/atrial fibrillation/flutter,  with RVR. (Paroxysmal).He was closely monitored under telemetry, amiodarone was discontinued due to lung toxicity. He responded well to electrical cardioversion. Currently continue rate control with carvedilol. Anticoagulation with apixaban. Continue heart failure management.  3.Amiodarone lung toxicity/history of asthma..Patient with patchy infiltrate right lower  lobe, initially treated for community-acquired pneumonia, posteriorly lung infection was ruled out,and antibiotics were discontinued. Reactive leukocytosis/thrombocytosis.  Further work-up with CT chest showed patchy bilateral infiltrates, predominantly at bases. Amiodarone was discontinued and patient was placed on systemic corticosteroids. Will need a slow taper, follow-up as an outpatient. Patient will be discharged on supplemental oxygen per nasal cannula.  Continue bronchodilator therapy.  4. AKI on chronic kidney disease stage IIIb.Patient tolerated well diuresis with furosemide metolazone. Anemia of chronic renal disease. Follow-up as an outpatient.  At discharge sodium 138, potassium 3.9, chloride 100, bicarb 29, glucose 189, BUN 60, creatinine 1.88.  5.Coronary artery disease (hx angioplasty and stent).Patient remained chest pain-free, continue carvedilol, clopidogrel, apixaban and Ranexa.  6.Type 2 diabetes mellitus.Patient was placed on insulin sliding scale for glucose coverage and monitoring. Continue basal insulin during hospitalization.   Discharge Diagnoses:  Principal Problem:   Acute on chronic diastolic CHF (congestive heart failure) (HCC) Active Problems:   Type 2 diabetes mellitus without complication, without long-term current use of insulin (HCC)   Mild persistent asthma   CAD in native artery   A-fib (HCC)   Acute hypoxemic respiratory failure (HCC)   Syncope and collapse   Acute-on-chronic kidney injury (HCC)   Amiodarone pulmonary toxicity    Discharge  Instructions   Allergies as of 08/20/2020   No Known Allergies     Medication List    STOP taking these medications   amiodarone 200 MG tablet Commonly known as: PACERONE     TAKE these medications   acetaminophen 500 MG tablet Commonly known as: TYLENOL Take 1,000 mg by mouth every 8 (eight) hours as needed (for pain or headaches).   Advair Diskus 100-50 MCG/DOSE Aepb Generic drug: Fluticasone-Salmeterol INHALE 1 PUFF INTO THE LUNGS TWICE DAILY What changed: See the new instructions.   Artificial Tears 1.4 % ophthalmic solution Generic drug: polyvinyl alcohol Place 1 drop into both eyes daily as needed for dry eyes (irritation).   atorvastatin 80 MG tablet Commonly known as: LIPITOR Take 1 tablet (80 mg total) by mouth daily.   carvedilol 12.5 MG tablet Commonly known as: COREG Take 1.5 tablets (18.75 mg total) by mouth 2 (two) times daily with a meal.   cholecalciferol 1000 units tablet Commonly known as: VITAMIN D Take 1 tablet (1,000 Units total) by mouth daily.   clopidogrel 75 MG tablet Commonly known as: Plavix Take 1 tablet (75 mg total) by mouth daily.   Eliquis 5 MG Tabs tablet Generic drug: apixaban TAKE 1 TABLET(5 MG) BY MOUTH TWICE DAILY What changed: See the new instructions.   empagliflozin 10 MG Tabs tablet Commonly known as: Jardiance Take 1 tablet (10 mg total) by mouth daily before breakfast.   Entresto 49-51 MG Generic drug: sacubitril-valsartan TAKE 1 TABLET BY MOUTH TWICE DAILY   ezetimibe 10 MG tablet Commonly known as: ZETIA Take 1 tablet (10 mg total) by mouth daily.   glipiZIDE 5 MG tablet Commonly known as: GLUCOTROL Take 1 tablet (5 mg total) by mouth daily before breakfast.   icosapent Ethyl 1 g capsule Commonly known as: Vascepa Take 2 capsules (2 g total) by mouth 2 (two) times daily.   metFORMIN 1000 MG tablet Commonly known as: GLUCOPHAGE Take 1 tablet (1,000 mg total) by mouth daily with breakfast.    nitroGLYCERIN 0.4 MG SL tablet Commonly known as: Nitrostat Place 1 tablet (0.4 mg total) under the tongue every 5 (five) minutes as needed for chest pain.   predniSONE 10 MG tablet  Commonly known as: DELTASONE Take 6 tablets for 18 days, then take 5 tablets for 21 days, then take 4 tablets for 21 days, then take 3 tablets daily for 21 days, then take 2 tablets daily for 21 days, then take 1 tablet daily for 21 days.   ranolazine 500 MG 12 hr tablet Commonly known as: RANEXA Take 1 tablet (500 mg total) by mouth 2 (two) times daily.   sulfamethoxazole-trimethoprim 800-160 MG tablet Commonly known as: BACTRIM DS Take 1 tablet by mouth 3 (three) times a week.   torsemide 20 MG tablet Commonly known as: DEMADEX Take 1 tablet (20 mg total) by mouth in the morning. Start taking on 08/22/20 What changed:   how much to take  additional instructions            Durable Medical Equipment  (From admission, onward)         Start     Ordered   08/19/20 1652  For home use only DME oxygen  Once       Comments: High flow nasal cannula with oxymizer, on ambulation needs 10 Liter per minute with close oxymetry monitoring. At rest on 5 L/min to keep oxygen saturation 88% or greater  Question Answer Comment  Length of Need 6 Months   Mode or (Route) Nasal cannula   Liters per Minute 5   Frequency Continuous (stationary and portable oxygen unit needed)   Oxygen conserving device Yes   Oxygen delivery system Gas      08/19/20 1651   08/18/20 1041  For home use only DME Walker  Once       Question:  Patient needs a walker to treat with the following condition  Answer:  Ambulatory dysfunction   08/18/20 1041   08/18/20 1040  For home use only DME 3 n 1  Once        08/18/20 1041          Follow-up Information    Allred, Fayrene Fearing, MD Follow up.   Specialty: Cardiology Why: 08/16/20 @ 3:00PM Contact information: [address] [place] [address] [phone]         [facility] Follow up on 08/31/2020.   Specialty: Cardiology Why: 3:00 PM at the [facility], [address] Code [address]231 Contact information: [address] [id number] mc [address] [place] [address] [phone]       [facility] Follow up.   Why: home oxygen Contact information: [address] [place] [address] [phone]        [facility] Follow up.   Why: HHRN, HHPT, HHOT Contact information: [address] [place] [address] [phone]              No Known Allergies  Consultations:  Pulmonary   Cardiology    Procedures/Studies: DG Chest 2 View  Result Date: 08/12/2020 CLINICAL DATA:  Pneumonia. EXAM: CHEST - 2 VIEW COMPARISON:  August 09, 2020. FINDINGS: Stable cardiomegaly. No pneumothorax is noted. Stable bilateral perihilar and basilar opacities are noted concerning for pneumonia with possible small pleural effusions. Bony thorax is unremarkable. IMPRESSION: Stable bilateral lung opacities are noted concerning for pneumonia and small pleural effusions. Stable cardiomegaly. Electronically Signed   By: Lupita Raider M.D.   On: 08/12/2020 11:08   CT CHEST WO CONTRAST  Result Date: 08/13/2020 CLINICAL DATA:  Respiratory failure.  Possible amiodarone toxicity. EXAM: CT  CHEST WITHOUT CONTRAST TECHNIQUE: Multidetector CT imaging of the chest was performed following the standard protocol without IV contrast. COMPARISON:  02/11/2016 FINDINGS: Cardiovascular: Cardiac enlargement. Coronary artery and aortic calcification. Probable coronary stents. No aortic aneurysm. Mediastinum/Nodes: Mild prominence of mediastinal lymph nodes with pretracheal nodes measuring up to about 1.4 cm short axis dimension. Similar appearance to previous study, likely reactive. Esophagus is decompressed. Lungs/Pleura: Small right pleural effusion. Patchy and confluent areas of  consolidation throughout both lungs but most prominent in the bases. Appearance suggests pneumonia. Amiodarone toxicity would be a possibility as well. Edema less likely. Clinical correlation is suggested. Upper Abdomen: No acute abnormality. Musculoskeletal: Degenerative changes in the spine with bridging anterior osteophytes. IMPRESSION: 1. Cardiac enlargement. 2. Small right pleural effusion 3. Patchy and confluent areas of consolidation throughout both lungs but most prominent in the bases. Appearance suggests pneumonia. Amiodarone toxicity would be a possibility as well. Clinical correlation is suggested. 4. Mild prominence of mediastinal lymph nodes, likely reactive. 5. Aortic atherosclerosis. Aortic Atherosclerosis (ICD10-I70.0). Electronically Signed   By: Burman NievesWilliam  Stevens M.D.   On: 08/13/2020 21:02   DG CHEST PORT 1 VIEW  Result Date: 08/18/2020 CLINICAL DATA:  Respiratory failure. EXAM: PORTABLE CHEST 1 VIEW COMPARISON:  08/13/2020.  08/12/2020. FINDINGS: Cardiomegaly. Bibasilar pulmonary infiltrates/edema, slightly improved from prior exam. No pleural effusion or pneumothorax. No acute bony abnormality. IMPRESSION: Cardiomegaly. Bibasilar pulmonary infiltrates/edema, slightly improved from prior exam. Electronically Signed   By: Maisie Fushomas  Register   On: 08/18/2020 06:08   DG Chest Port 1 View  Result Date: 08/09/2020 CLINICAL DATA:  Shortness of breath EXAM: PORTABLE CHEST 1 VIEW COMPARISON:  12/17/2019, CT 02/11/2016 FINDINGS: Cardiomegaly with vascular congestion. Small bilateral pleural effusions. Patchy airspace disease in the right greater than left lung base. No pneumothorax. IMPRESSION: Cardiomegaly with vascular congestion and small bilateral pleural effusions. Patchy airspace disease at the right greater than left lung base, suspicious for pneumonia Electronically Signed   By: Jasmine PangKim  Fujinaga M.D.   On: 08/09/2020 17:46   ECHOCARDIOGRAM COMPLETE  Result Date: 08/11/2020    ECHOCARDIOGRAM  REPORT   Patient Name:   David Huynh Date of Exam: 08/11/2020 Medical Rec #:  [id number]         Height:       72.0 in Accession #:    [id number]        Weight:       270.0 lb Date of Birth:  1940/08/08         BSA:          2.420 m Patient Age:    80 years          BP:           157/96 mmHg Patient Gender: M                 HR:           92 bpm. Exam Location:  Inpatient Procedure: 2D Echo, Cardiac Doppler, Color Doppler and Intracardiac            Opacification Agent Indications:    CHF-Acute Systolic I50.21  History:        Patient has prior history of Echocardiogram examinations, most                 recent 03/30/2020. CHF, CAD, Arrythmias:Atrial Fibrillation;                 Risk Factors:Dyslipidemia, Diabetes and Hypertension. Ischemic  Cardiomyopathy.  Sonographer:    Leta Jungling RDCS Referring Phys: [id number] CHING T TU  Sonographer Comments: Suboptimal apical window. Image acquisition challenging due to respiratory motion. IMPRESSIONS  1. Inferior basal hypokinesis . Left ventricular ejection fraction, by estimation, is 50 to 55%. The left ventricle has low normal function. The left ventricle demonstrates regional wall motion abnormalities (see scoring diagram/findings for description). Left ventricular diastolic parameters are indeterminate.  2. Right ventricular systolic function is normal. The right ventricular size is normal. There is normal pulmonary artery systolic pressure.  3. Left atrial size was moderately dilated.  4. Right atrial size was moderately dilated.  5. Color flow interrogation of MV not very good due to poor image quality MR previously described as moderate on TEE 02/2020 . The mitral valve was not well visualized. Mild mitral valve regurgitation. No evidence of mitral stenosis. Moderate mitral annular calcification.  6. Tricuspid valve regurgitation is moderate.  7. The aortic valve is tricuspid. There is moderate calcification of the aortic valve. There is  moderate thickening of the aortic valve. Aortic valve regurgitation is not visualized. Mild aortic valve stenosis.  8. Aortic dilatation noted. There is mild dilatation of the aortic root, measuring 40 mm.  9. The inferior vena cava is normal in size with greater than 50% respiratory variability, suggesting right atrial pressure of 3 mmHg. FINDINGS  Left Ventricle: Inferior basal hypokinesis. Left ventricular ejection fraction, by estimation, is 50 to 55%. The left ventricle has low normal function. The left ventricle demonstrates regional wall motion abnormalities. Definity contrast agent was given IV to delineate the left ventricular endocardial borders. The left ventricular internal cavity size was normal in size. There is no left ventricular hypertrophy. Left ventricular diastolic parameters are indeterminate. Right Ventricle: The right ventricular size is normal. No increase in right ventricular wall thickness. Right ventricular systolic function is normal. There is normal pulmonary artery systolic pressure. The tricuspid regurgitant velocity is 2.34 m/s, and  with an assumed right atrial pressure of 8 mmHg, the estimated right ventricular systolic pressure is 29.9 mmHg. Left Atrium: Left atrial size was moderately dilated. Right Atrium: Right atrial size was moderately dilated. Pericardium: There is no evidence of pericardial effusion. Mitral Valve: Color flow interrogation of MV not very good due to poor image quality MR previously described as moderate on TEE 02/2020. The mitral valve was not well visualized. There is mild thickening of the mitral valve leaflet(s). There is mild calcification of the mitral valve leaflet(s). Moderate mitral annular calcification. Mild mitral valve regurgitation. No evidence of mitral valve stenosis. Tricuspid Valve: The tricuspid valve is normal in structure. Tricuspid valve regurgitation is moderate . No evidence of tricuspid stenosis. Aortic Valve: The aortic valve is  tricuspid. There is moderate calcification of the aortic valve. There is moderate thickening of the aortic valve. There is moderate aortic valve annular calcification. Aortic valve regurgitation is not visualized. Mild aortic stenosis is present. Aortic valve mean gradient measures 13.6 mmHg. Aortic valve peak gradient measures 27.2 mmHg. Aortic valve area, by VTI measures 2.33 cm. Pulmonic Valve: The pulmonic valve was normal in structure. Pulmonic valve regurgitation is not visualized. No evidence of pulmonic stenosis. Aorta: The aortic root is normal in size and structure and aortic dilatation noted. There is mild dilatation of the aortic root, measuring 40 mm. Venous: The inferior vena cava is normal in size with greater than 50% respiratory variability, suggesting right atrial pressure of 3 mmHg. IAS/Shunts: No atrial level shunt detected by color flow  Doppler.  LEFT VENTRICLE PLAX 2D LVIDd:         5.00 cm  Diastology LVIDs:         3.80 cm  LV e' medial:    6.31 cm/s LV PW:         0.80 cm  LV E/e' medial:  23.1 LV IVS:        1.10 cm  LV e' lateral:   8.16 cm/s LVOT diam:     2.40 cm  LV E/e' lateral: 17.8 LV SV:         119 LV SV Index:   49 LVOT Area:     4.52 cm  RIGHT VENTRICLE RV S prime:     11.30 cm/s TAPSE (M-mode): 2.1 cm LEFT ATRIUM           Index       RIGHT ATRIUM           Index LA diam:      4.30 cm 1.78 cm/m  RA Area:     20.60 cm LA Vol (A2C): 83.8 ml 34.62 ml/m RA Volume:   50.40 ml  20.82 ml/m  AORTIC VALVE AV Area (Vmax):    2.45 cm AV Area (Vmean):   2.70 cm AV Area (VTI):     2.33 cm AV Vmax:           260.60 cm/s AV Vmean:          166.800 cm/s AV VTI:            0.509 m AV Peak Grad:      27.2 mmHg AV Mean Grad:      13.6 mmHg LVOT Vmax:         141.00 cm/s LVOT Vmean:        99.400 cm/s LVOT VTI:          0.262 m LVOT/AV VTI ratio: 0.52  AORTA Ao Root diam: 4.00 cm Ao Asc diam:  3.60 cm MITRAL VALVE                TRICUSPID VALVE MV Area (PHT): 5.77 cm     TR Peak grad:    21.9 mmHg MV Decel Time: 132 msec     TR Vmax:        234.00 cm/s MV E velocity: 145.50 cm/s                             SHUNTS                             Systemic VTI:  0.26 m                             Systemic Diam: 2.40 cm Charlton Haws MD Electronically signed by Charlton Haws MD Signature Date/Time: 08/11/2020/1:23:12 PM    Final       Procedures: electrical cardioversion   Subjective: Patient is feeling better, continue to have dyspnea on ambulation but improving, at rest with no dyspnea, no chest pain.   Discharge Exam: Vitals:   08/20/20 0742 08/20/20 0816  BP: (!) 85/51   Pulse: 67   Resp: 19   Temp: 97.6 F (36.4 C)   SpO2: 92% 91%   Vitals:   08/20/20 0322 08/20/20 0324 08/20/20 0742 08/20/20 0816  BP:  (!) 89/59 (!) 85/51  Pulse:  67 67   Resp:  19 19   Temp: (!) 97.3 F (36.3 C) 97.7 F (36.5 C) 97.6 F (36.4 C)   TempSrc: Oral Oral Oral   SpO2: 93% 92% 92% 91%  Weight: 117.2 kg     Height:        General: Not in pain or dyspnea at rest.  Neurology: Awake and alert, non focal  E ENT: no pallor, no icterus, oral mucosa moist Cardiovascular: No JVD. S1-S2 present, rhythmic, no gallops, rubs, or murmurs. No lower extremity edema. Pulmonary: positive breath sounds bilaterally, no wheezing, or rhonchi, on anterior auscultation.  Gastrointestinal. Abdomen soft and non tender Skin. No rashes Musculoskeletal: no joint deformities   The results of significant diagnostics from this hospitalization (including imaging, microbiology, ancillary and laboratory) are listed below for reference.     Microbiology: Recent Results (from the past 240 hour(s))  MRSA PCR Screening     Status: None   Collection Time: 08/11/20  1:08 AM   Specimen: Nasal Mucosa; Nasopharyngeal  Result Value Ref Range Status   MRSA by PCR NEGATIVE NEGATIVE Final    Comment:        The GeneXpert MRSA Assay (FDA approved for NASAL specimens only), is one component of a comprehensive MRSA  colonization surveillance program. It is not intended to diagnose MRSA infection nor to guide or monitor treatment for MRSA infections. Performed at [facility], [address], [place], [place] [address]      Labs: BNP (last 3 results) Recent Labs    12/06/19 1551 08/09/20 1643  BNP 792.4* 339.4*   Basic Metabolic Panel: Recent Labs  Lab 08/17/20 0026 08/17/20 0835 08/18/20 0032 08/19/20 0035 08/20/20 0018  NA 135 134* 134* 135 138  K 4.4 4.2 4.1 4.0 3.9  CL 98 96* 99 99 100  CO2 GLUCOSE 261* 320* 257* 272* 189*  BUN 62* 63* 58* 59* 60*  CREATININE 1.77* 1.85* 1.69* 1.93* 1.88*  CALCIUM 8.9 9.0 9.0 8.5* 8.7*  MG  --  3.0* 3.0*  --   --    Liver Function Tests: No results for input(s): AST, ALT, ALKPHOS, BILITOT, PROT, ALBUMIN in the last 168 hours. No results for input(s): LIPASE, AMYLASE in the last 168 hours. No results for input(s): AMMONIA in the last 168 hours. CBC: Recent Labs  Lab 08/14/20 0057 08/16/20 0222 08/18/20 0032  WBC 7.8  --  14.5*  HGB 12.9* 13.2 13.0  13.1  HCT 39.2 38.8* 39.0  MCV 94.5  --  92.0  PLT 541*  --  541*   Cardiac Enzymes: No results for input(s): CKTOTAL, CKMB, CKMBINDEX, TROPONINI in the last 168 hours. BNP: Invalid input(s): POCBNP CBG: Recent Labs  Lab 08/19/20 1134 08/19/20 1701 08/19/20 2103 08/20/20 0322 08/20/20 0641  GLUCAP 265* 311* 279* 194* 145*   D-Dimer No results for input(s): DDIMER in the last 72 hours. Hgb A1c No results for input(s): HGBA1C in the last 72 hours. Lipid Profile No results for input(s): CHOL, HDL, LDLCALC, TRIG, CHOLHDL, LDLDIRECT in the last 72 hours. Thyroid function studies No results for input(s): TSH, T4TOTAL, T3FREE, THYROIDAB in the last 72 hours.  Invalid input(s): FREET3 Anemia work up No results for input(s): VITAMINB12, FOLATE, FERRITIN, TIBC, IRON, RETICCTPCT in the last 72 hours. Urinalysis    Component Value Date/Time   COLORURINE  STRAW (A) 03/25/2018 1627   APPEARANCEUR CLEAR 03/25/2018 1627   LABSPEC >1.030 (H) 03/25/2018  1627   PHURINE 5.5 03/25/2018 1627   GLUCOSEU 100 (A) 03/25/2018 1627   HGBUR TRACE (A) 03/25/2018 1627   BILIRUBINUR NEGATIVE 03/25/2018 1627   KETONESUR NEGATIVE 03/25/2018 1627   PROTEINUR 100 (A) 03/25/2018 1627   NITRITE NEGATIVE 03/25/2018 1627   LEUKOCYTESUR NEGATIVE 03/25/2018 1627   Sepsis Labs Invalid input(s): PROCALCITONIN,  WBC,  LACTICIDVEN Microbiology Recent Results (from the past 240 hour(s))  MRSA PCR Screening     Status: None   Collection Time: 08/11/20  1:08 AM   Specimen: Nasal Mucosa; Nasopharyngeal  Result Value Ref Range Status   MRSA by PCR NEGATIVE NEGATIVE Final    Comment:        The GeneXpert MRSA Assay (FDA approved for NASAL specimens only), is one component of a comprehensive MRSA colonization surveillance program. It is not intended to diagnose MRSA infection nor to guide or monitor treatment for MRSA infections. Performed at [facility], [address] [address], [place], [place] [address]      Time coordinating discharge: 45 minutes  SIGNED:   Coralie Keens, MD  [facility] 08/20/2020, 9:34 AM

## 2020-08-20 NOTE — Plan of Care (Signed)

## 2020-08-20 NOTE — Progress Notes (Signed)
Patient discharge instructions reviewed and patient verbalizes understanding of medications, follow up appointments and instructions.  Portable oxygen delivered to patient room.  Concentrator is present in the home per patient's wife [name]. Via wheelchair to waiting car in stable condition with portable oxygen set on 6 liters.

## 2020-08-23 ENCOUNTER — Telehealth: Payer: Self-pay

## 2020-08-23 NOTE — Telephone Encounter (Signed)
Transition Care Management Follow-up Telephone Call  Date of discharge and from where: 08/20/20-[facility]  How have you been since you were released from the hospital? A lot better-breathing better. Walking with a walker  Any questions or concerns? No  Items Reviewed:  Did the pt receive and understand the discharge instructions provided? Yes   Medications obtained and verified? Yes   Other? Yes   Any new allergies since your discharge? No   Dietary orders reviewed? No  Do you have support at home? Yes   Home Care and Equipment/Supplies: Were home health services ordered? yes If so, what is the name of the agency? [facility]  Has the agency set up a time to come to the patient's home? yes Were any new equipment or medical supplies ordered?  Yes: Oxygen What is the name of the medical supply agency? [facility] Were you able to get the supplies/equipment? yes Do you have any questions related to the use of the equipment or supplies? No  Functional Questionnaire: (I = Independent and D = Dependent) ADLs: I with assistance  Bathing/Dressing- I  Meal Prep- D  Eating- I  Maintaining continence- I  Transferring/Ambulation- I with walker  Managing Meds- I  Follow up appointments reviewed:   PCP Hospital f/u appt confirmed? Yes  Scheduled to see Esperanza Richters on 08/26/20 @ 10:20.  Specialist Hospital f/u appt confirmed? Yes  Scheduled to see Dr. Johney Frame on 09/06/2020 @ 4:15.  Are transportation arrangements needed? No   If their condition worsens, is the pt aware to call PCP or go to the Emergency Dept.? Yes  Was the patient provided with contact information for the PCP's office or ED? Yes  Was to pt encouraged to call back with questions or concerns? Yes

## 2020-08-23 NOTE — Telephone Encounter (Signed)
Please do not allow hospital follow up with me by video. In my opinion all hospital follow ups should never be virtual(exception covid). More so in this case 79 yo with syncope and pneumonia.   If pt wants virtual visit please try to accommodate with pt pcp or other provider.  Please confirm that he is switched to in office with me. Also he should be 40 minute appointment. Note  Below.  David Huynh admitted to the hospitalwith theworking diagnosis of acute decompensationof diastolic heart failure, complicated with atrial fibrillation/flutter with rapid ventricular response and acute hypoxic respiratory failure. Amiodarone induced lung injury.   79 year old male past medical history for coronary artery disease, heart failure, paroxysmal atrial fibrillation, hypertension, dyslipidemia, type II diabetes mellitus, moderate aortic stenosis and chronic kidney disease stage III who presented with a syncope episode.

## 2020-08-24 ENCOUNTER — Telehealth: Payer: Self-pay | Admitting: Family Medicine

## 2020-08-24 DIAGNOSIS — I5041 Acute combined systolic (congestive) and diastolic (congestive) heart failure: Secondary | ICD-10-CM | POA: Diagnosis not present

## 2020-08-24 DIAGNOSIS — N1831 Chronic kidney disease, stage 3a: Secondary | ICD-10-CM | POA: Diagnosis not present

## 2020-08-24 DIAGNOSIS — E1122 Type 2 diabetes mellitus with diabetic chronic kidney disease: Secondary | ICD-10-CM | POA: Diagnosis not present

## 2020-08-24 DIAGNOSIS — Z955 Presence of coronary angioplasty implant and graft: Secondary | ICD-10-CM | POA: Diagnosis not present

## 2020-08-24 DIAGNOSIS — I2510 Atherosclerotic heart disease of native coronary artery without angina pectoris: Secondary | ICD-10-CM | POA: Diagnosis not present

## 2020-08-24 DIAGNOSIS — I130 Hypertensive heart and chronic kidney disease with heart failure and stage 1 through stage 4 chronic kidney disease, or unspecified chronic kidney disease: Secondary | ICD-10-CM | POA: Diagnosis not present

## 2020-08-24 DIAGNOSIS — I480 Paroxysmal atrial fibrillation: Secondary | ICD-10-CM | POA: Diagnosis not present

## 2020-08-24 DIAGNOSIS — I350 Nonrheumatic aortic (valve) stenosis: Secondary | ICD-10-CM | POA: Diagnosis not present

## 2020-08-24 DIAGNOSIS — J4520 Mild intermittent asthma, uncomplicated: Secondary | ICD-10-CM | POA: Diagnosis not present

## 2020-08-24 DIAGNOSIS — E785 Hyperlipidemia, unspecified: Secondary | ICD-10-CM | POA: Diagnosis not present

## 2020-08-24 NOTE — Telephone Encounter (Signed)
Okay to give VO ? °

## 2020-08-24 NOTE — Telephone Encounter (Signed)
Yes please give requested VO

## 2020-08-24 NOTE — Telephone Encounter (Signed)
VO given to Development worker, international aid Dianna at [facility]

## 2020-08-24 NOTE — Telephone Encounter (Signed)
CallerOlegario Messier [facility] Call back [phone]   Verbal order for nursing  2 times a week for 2 weeks 1 time a week for 4 weeks

## 2020-08-24 NOTE — Progress Notes (Signed)
@Patient  ID: David Huynh, male    DOB: [birth year], 80 y.o.   MRN: [id number]  Chief Complaint  Patient presents with  . Follow-up    Still having shortness of breath since hospital discharge.    Referring provider: Bradd Canary, MD  HPI: 80 year old male, never smoked.  Significant for hypertension, cardiomyopathy, congestive heart failure, aortic stenosis, amiodarone pulmonary toxicity, mild persistent asthma, type 2 diabetes.  Recent hospitalization for acute decompensation of diastolic heart failure complicated by A. fib/flutter and acute hypoxic respiratory failure.   Hospital course 08/09/2020-08/20/2020 Patient presented to ED with dyspnea.  Upon EMS arrival he was in atrial fib/flutter 100 to 120 bpm.  Patient developed SVT/flutter 220 bpm.  He was placed on amiodarone infusion and underwent cardioversion on 3/30.  Patient with persistent hypoxia of respiratory failure, work-up with CT chest showed patchy bilateral infiltrates, predominantly at lower lobes.  Amiodarone discontinued due to medication induced lung injury and started on systemic steroids.  Continue to have increased oxygen requirements on ambulation up to 10 L.  He will need a slow prednisone taper with follow-up outpatient.  Patient was discharged on supplemental oxygen nasal cannula.  Continue bronchodilator therapy.  08/25/2020 Patient presents today for hospital follow-up hypoxic respiratory failure/amiodarone toxicity. Accompanied by his wife. His breathing has "gotten measurably better".  He is currently on 60mg  prednisone, plan is to taper 10mg  every 21 days. BS was 137 yesterday. He has an endocrinologist and will be seeing her the end of April. He is taking Advair inhaler as needed. He is currently on 5L oxygen. For short periods of time he is able to go without his oxygen at home. He is using an rolling walker to ambulate. He will be getting nursing/physical therapy services at home. He will be seeing  cardiology next week. Plan repeat CXR today to monitor infiltrates.  Denies f/c/s, chest pain or tightness, wheezing, cough.  No Known Allergies  Immunization History  Administered Date(s) Administered  . Influenza Split 04/26/2011  . Influenza, High Dose Seasonal PF 02/12/2015, 03/29/2018, 01/25/2019  . PFIZER(Purple Top)SARS-COV-2 Vaccination 06/22/2019, 07/15/2019  . Pneumococcal Conjugate-13 05/21/2015  . Pneumococcal Polysaccharide-23 03/29/2018  . Td 05/15/2008    Past Medical History:  Diagnosis Date  . Allergy    seasonal  . Asthma    mild, intermittent  . Atrial fibrillation with rapid ventricular response (HCC) 03/25/2018  . CAD, multiple vessel    Three-vessel disease involving mid RCA 85% (PCI with DES), OM 2 80% in branch and ostD1 ~90%. Only the mid RCA with PCI target.   . Carotid bruit 08/05/2010   Bruit noted on left   . Chronic combined systolic and diastolic CHF (congestive heart failure) (HCC)   . CKD (chronic kidney disease), stage III (HCC)   . Diabetes mellitus    type 2  . Fracture of multiple ribs 02/12/2015  . Hemothorax on right 01/23/2015  . Hyperlipidemia   . Hypertension   . Hypocalcemia 02/12/2015  . Lactic acidosis 03/25/2018  . Liver function study, abnormal 04/26/2011  . Multiple rib fractures   . Obesity   . Persistent atrial fibrillation (HCC)   . Pneumonia     Tobacco History: Social History   Tobacco Use  Smoking Status Never Smoker  Smokeless Tobacco Never Used   Counseling given: Not Answered   Outpatient Medications Prior to Visit  Medication Sig Dispense Refill  . acetaminophen (TYLENOL) 500 MG tablet Take 1,000 mg by mouth every 8 (eight) hours as  needed (for pain or headaches).    . ADVAIR DISKUS 100-50 MCG/DOSE AEPB INHALE 1 PUFF INTO THE LUNGS TWICE DAILY (Patient taking differently: Inhale 1 puff into the lungs 2 (two) times daily as needed (shortness of breath/wheezing).) 60 each 0  . carvedilol (COREG) 12.5 MG tablet  Take 1.5 tablets (18.75 mg total) by mouth 2 (two) times daily with a meal. 270 tablet 3  . cholecalciferol (VITAMIN D) 1000 units tablet Take 1 tablet (1,000 Units total) by mouth daily. 30 tablet 0  . clopidogrel (PLAVIX) 75 MG tablet Take 1 tablet (75 mg total) by mouth daily. 90 tablet 3  . ELIQUIS 5 MG TABS tablet TAKE 1 TABLET(5 MG) BY MOUTH TWICE DAILY (Patient taking differently: Take 5 mg by mouth 2 (two) times daily.) 60 tablet 11  . empagliflozin (JARDIANCE) 10 MG TABS tablet Take 1 tablet (10 mg total) by mouth daily before breakfast. 30 tablet 11  . ENTRESTO 49-51 MG TAKE 1 TABLET BY MOUTH TWICE DAILY (Patient taking differently: Take 1 tablet by mouth 2 (two) times daily.) 60 tablet 11  . ezetimibe (ZETIA) 10 MG tablet Take 1 tablet (10 mg total) by mouth daily. 90 tablet 3  . furosemide (LASIX) 20 MG tablet Take 20 mg by mouth daily.    Marland Kitchen. glipiZIDE (GLUCOTROL) 5 MG tablet Take 1 tablet (5 mg total) by mouth daily before breakfast. 90 tablet 3  . icosapent Ethyl (VASCEPA) 1 g capsule Take 2 capsules (2 g total) by mouth 2 (two) times daily. 120 capsule 11  . metFORMIN (GLUCOPHAGE) 1000 MG tablet Take 1 tablet (1,000 mg total) by mouth daily with breakfast. 90 tablet 3  . nitroGLYCERIN (NITROSTAT) 0.4 MG SL tablet Place 1 tablet (0.4 mg total) under the tongue every 5 (five) minutes as needed for chest pain. 30 tablet 0  . polyvinyl alcohol (LIQUIFILM TEARS) 1.4 % ophthalmic solution Place 1 drop into both eyes daily as needed for dry eyes (irritation).    . predniSONE (DELTASONE) 10 MG tablet Take 6 tablets for 18 days, then take 5 tablets for 21 days, then take 4 tablets for 21 days, then take 3 tablets daily for 21 days, then take 2 tablets daily for 21 days, then take 1 tablet daily for 21 days. 108 tablet 0  . ranolazine (RANEXA) 500 MG 12 hr tablet Take 1 tablet (500 mg total) by mouth 2 (two) times daily. 180 tablet 0  . sulfamethoxazole-trimethoprim (BACTRIM DS) 800-160 MG tablet  Take 1 tablet by mouth 3 (three) times a week. 12 tablet 0  . torsemide (DEMADEX) 20 MG tablet Take 1 tablet (20 mg total) by mouth in the morning. Start taking on 08/22/20 30 tablet 0  . atorvastatin (LIPITOR) 80 MG tablet Take 1 tablet (80 mg total) by mouth daily. 30 tablet 6   No facility-administered medications prior to visit.    Review of Systems  Review of Systems  Constitutional: Negative.   Respiratory: Positive for shortness of breath. Negative for cough, chest tightness and wheezing.   Cardiovascular: Negative.  Negative for leg swelling.    Physical Exam  BP 124/76 (BP Location: Right Arm, Cuff Size: Normal)   Pulse (!) 109   Temp (!) 97.3 F (36.3 C) (Temporal)   Ht 6' (1.829 m)   Wt 264 lb 6.4 oz (119.9 kg)   SpO2 95% Comment: 5L [place]  BMI 35.86 kg/m  Physical Exam Constitutional:      Appearance: Normal appearance.  HENT:  Head: Normocephalic and atraumatic.     Mouth/Throat:     Comments: Deferred d/t masking Cardiovascular:     Rate and Rhythm: Normal rate.     Comments: No edema; Resting HR 86 Pulmonary:     Effort: Pulmonary effort is normal.     Breath sounds: No wheezing, rhonchi or rales.     Comments: CTA Neurological:     General: No focal deficit present.     Mental Status: He is alert and oriented to person, place, and time. Mental status is at baseline.  Psychiatric:        Mood and Affect: Mood normal.        Behavior: Behavior normal.        Thought Content: Thought content normal.        Judgment: Judgment normal.      Lab Results:  CBC    Component Value Date/Time   WBC 14.5 (H) 08/18/2020 0032   RBC 4.24 08/18/2020 0032   HGB 13.0 08/18/2020 0032   HGB 13.1 08/18/2020 0032   HCT 39.0 08/18/2020 0032   PLT 541 (H) 08/18/2020 0032   MCV 92.0 08/18/2020 0032   MCH 30.7 08/18/2020 0032   MCHC 33.3 08/18/2020 0032   RDW 12.1 08/18/2020 0032   LYMPHSABS 1.3 08/11/2020 0139   MONOABS 1.5 (H) 08/11/2020 0139   EOSABS 0.2  08/11/2020 0139   BASOSABS 0.1 08/11/2020 0139    BMET    Component Value Date/Time   NA 138 08/20/2020 0018   NA 141 02/02/2017 1049   K 3.9 08/20/2020 0018   CL 100 08/20/2020 0018   CO2 29 08/20/2020 0018   GLUCOSE 189 (H) 08/20/2020 0018   BUN 60 (H) 08/20/2020 0018   BUN 25 02/02/2017 1049   CREATININE 1.88 (H) 08/20/2020 0018   CREATININE 1.76 (H) 12/25/2016 1409   CALCIUM 8.7 (L) 08/20/2020 0018   GFRNONAA 36 (L) 08/20/2020 0018   GFRAA 49 (L) 02/16/2020 1046    BNP    Component Value Date/Time   BNP 339.4 (H) 08/09/2020 1643    ProBNP    Component Value Date/Time   PROBNP 593.0 (H) 12/17/2019 1327    Imaging: DG Chest 2 View  Result Date: 08/12/2020 CLINICAL DATA:  Pneumonia. EXAM: CHEST - 2 VIEW COMPARISON:  August 09, 2020. FINDINGS: Stable cardiomegaly. No pneumothorax is noted. Stable bilateral perihilar and basilar opacities are noted concerning for pneumonia with possible small pleural effusions. Bony thorax is unremarkable. IMPRESSION: Stable bilateral lung opacities are noted concerning for pneumonia and small pleural effusions. Stable cardiomegaly. Electronically Signed   By: Lupita Raider M.D.   On: 08/12/2020 11:08   CT CHEST WO CONTRAST  Result Date: 08/13/2020 CLINICAL DATA:  Respiratory failure.  Possible amiodarone toxicity. EXAM: CT CHEST WITHOUT CONTRAST TECHNIQUE: Multidetector CT imaging of the chest was performed following the standard protocol without IV contrast. COMPARISON:  02/11/2016 FINDINGS: Cardiovascular: Cardiac enlargement. Coronary artery and aortic calcification. Probable coronary stents. No aortic aneurysm. Mediastinum/Nodes: Mild prominence of mediastinal lymph nodes with pretracheal nodes measuring up to about 1.4 cm short axis dimension. Similar appearance to previous study, likely reactive. Esophagus is decompressed. Lungs/Pleura: Small right pleural effusion. Patchy and confluent areas of consolidation throughout both lungs but  most prominent in the bases. Appearance suggests pneumonia. Amiodarone toxicity would be a possibility as well. Edema less likely. Clinical correlation is suggested. Upper Abdomen: No acute abnormality. Musculoskeletal: Degenerative changes in the spine with bridging anterior osteophytes. IMPRESSION: 1.  Cardiac enlargement. 2. Small right pleural effusion 3. Patchy and confluent areas of consolidation throughout both lungs but most prominent in the bases. Appearance suggests pneumonia. Amiodarone toxicity would be a possibility as well. Clinical correlation is suggested. 4. Mild prominence of mediastinal lymph nodes, likely reactive. 5. Aortic atherosclerosis. Aortic Atherosclerosis (ICD10-I70.0). Electronically Signed   By: Burman Nieves M.D.   On: 08/13/2020 21:02   DG CHEST PORT 1 VIEW  Result Date: 08/18/2020 CLINICAL DATA:  Respiratory failure. EXAM: PORTABLE CHEST 1 VIEW COMPARISON:  08/13/2020.  08/12/2020. FINDINGS: Cardiomegaly. Bibasilar pulmonary infiltrates/edema, slightly improved from prior exam. No pleural effusion or pneumothorax. No acute bony abnormality. IMPRESSION: Cardiomegaly. Bibasilar pulmonary infiltrates/edema, slightly improved from prior exam. Electronically Signed   By: Maisie Fus  Register   On: 08/18/2020 06:08   DG Chest Port 1 View  Result Date: 08/09/2020 CLINICAL DATA:  Shortness of breath EXAM: PORTABLE CHEST 1 VIEW COMPARISON:  12/17/2019, CT 02/11/2016 FINDINGS: Cardiomegaly with vascular congestion. Small bilateral pleural effusions. Patchy airspace disease in the right greater than left lung base. No pneumothorax. IMPRESSION: Cardiomegaly with vascular congestion and small bilateral pleural effusions. Patchy airspace disease at the right greater than left lung base, suspicious for pneumonia Electronically Signed   By: Jasmine Pang M.D.   On: 08/09/2020 17:46   ECHOCARDIOGRAM COMPLETE  Result Date: 08/11/2020    ECHOCARDIOGRAM REPORT   Patient Name:   CORDAE MCCAREY Date of Exam: 08/11/2020 Medical Rec #:  [id number]         Height:       72.0 in Accession #:    [id number]        Weight:       270.0 lb Date of Birth:  [birth year]         BSA:          2.420 m Patient Age:    80 years          BP:           157/96 mmHg Patient Gender: M                 HR:           92 bpm. Exam Location:  Inpatient Procedure: 2D Echo, Cardiac Doppler, Color Doppler and Intracardiac            Opacification Agent Indications:    CHF-Acute Systolic I50.21  History:        Patient has prior history of Echocardiogram examinations, most                 recent 03/30/2020. CHF, CAD, Arrythmias:Atrial Fibrillation;                 Risk Factors:Dyslipidemia, Diabetes and Hypertension. Ischemic                 Cardiomyopathy.  Sonographer:    Leta Jungling RDCS Referring Phys: [id number] CHING T TU  Sonographer Comments: Suboptimal apical window. Image acquisition challenging due to respiratory motion. IMPRESSIONS  1. Inferior basal hypokinesis . Left ventricular ejection fraction, by estimation, is 50 to 55%. The left ventricle has low normal function. The left ventricle demonstrates regional wall motion abnormalities (see scoring diagram/findings for description). Left ventricular diastolic parameters are indeterminate.  2. Right ventricular systolic function is normal. The right ventricular size is normal. There is normal pulmonary artery systolic pressure.  3. Left atrial size was moderately dilated.  4. Right atrial size was moderately dilated.  5. Color  flow interrogation of MV not very good due to poor image quality MR previously described as moderate on TEE 02/2020 . The mitral valve was not well visualized. Mild mitral valve regurgitation. No evidence of mitral stenosis. Moderate mitral annular calcification.  6. Tricuspid valve regurgitation is moderate.  7. The aortic valve is tricuspid. There is moderate calcification of the aortic valve. There is moderate thickening of the aortic valve.  Aortic valve regurgitation is not visualized. Mild aortic valve stenosis.  8. Aortic dilatation noted. There is mild dilatation of the aortic root, measuring 40 mm.  9. The inferior vena cava is normal in size with greater than 50% respiratory variability, suggesting right atrial pressure of 3 mmHg. FINDINGS  Left Ventricle: Inferior basal hypokinesis. Left ventricular ejection fraction, by estimation, is 50 to 55%. The left ventricle has low normal function. The left ventricle demonstrates regional wall motion abnormalities. Definity contrast agent was given IV to delineate the left ventricular endocardial borders. The left ventricular internal cavity size was normal in size. There is no left ventricular hypertrophy. Left ventricular diastolic parameters are indeterminate. Right Ventricle: The right ventricular size is normal. No increase in right ventricular wall thickness. Right ventricular systolic function is normal. There is normal pulmonary artery systolic pressure. The tricuspid regurgitant velocity is 2.34 m/s, and  with an assumed right atrial pressure of 8 mmHg, the estimated right ventricular systolic pressure is 29.9 mmHg. Left Atrium: Left atrial size was moderately dilated. Right Atrium: Right atrial size was moderately dilated. Pericardium: There is no evidence of pericardial effusion. Mitral Valve: Color flow interrogation of MV not very good due to poor image quality MR previously described as moderate on TEE 02/2020. The mitral valve was not well visualized. There is mild thickening of the mitral valve leaflet(s). There is mild calcification of the mitral valve leaflet(s). Moderate mitral annular calcification. Mild mitral valve regurgitation. No evidence of mitral valve stenosis. Tricuspid Valve: The tricuspid valve is normal in structure. Tricuspid valve regurgitation is moderate . No evidence of tricuspid stenosis. Aortic Valve: The aortic valve is tricuspid. There is moderate calcification of  the aortic valve. There is moderate thickening of the aortic valve. There is moderate aortic valve annular calcification. Aortic valve regurgitation is not visualized. Mild aortic stenosis is present. Aortic valve mean gradient measures 13.6 mmHg. Aortic valve peak gradient measures 27.2 mmHg. Aortic valve area, by VTI measures 2.33 cm. Pulmonic Valve: The pulmonic valve was normal in structure. Pulmonic valve regurgitation is not visualized. No evidence of pulmonic stenosis. Aorta: The aortic root is normal in size and structure and aortic dilatation noted. There is mild dilatation of the aortic root, measuring 40 mm. Venous: The inferior vena cava is normal in size with greater than 50% respiratory variability, suggesting right atrial pressure of 3 mmHg. IAS/Shunts: No atrial level shunt detected by color flow Doppler.  LEFT VENTRICLE PLAX 2D LVIDd:         5.00 cm  Diastology LVIDs:         3.80 cm  LV e' medial:    6.31 cm/s LV PW:         0.80 cm  LV E/e' medial:  23.1 LV IVS:        1.10 cm  LV e' lateral:   8.16 cm/s LVOT diam:     2.40 cm  LV E/e' lateral: 17.8 LV SV:         119 LV SV Index:   49 LVOT Area:  4.52 cm  RIGHT VENTRICLE RV S prime:     11.30 cm/s TAPSE (M-mode): 2.1 cm LEFT ATRIUM           Index       RIGHT ATRIUM           Index LA diam:      4.30 cm 1.78 cm/m  RA Area:     20.60 cm LA Vol (A2C): 83.8 ml 34.62 ml/m RA Volume:   50.40 ml  20.82 ml/m  AORTIC VALVE AV Area (Vmax):    2.45 cm AV Area (Vmean):   2.70 cm AV Area (VTI):     2.33 cm AV Vmax:           260.60 cm/s AV Vmean:          166.800 cm/s AV VTI:            0.509 m AV Peak Grad:      27.2 mmHg AV Mean Grad:      13.6 mmHg LVOT Vmax:         141.00 cm/s LVOT Vmean:        99.400 cm/s LVOT VTI:          0.262 m LVOT/AV VTI ratio: 0.52  AORTA Ao Root diam: 4.00 cm Ao Asc diam:  3.60 cm MITRAL VALVE                TRICUSPID VALVE MV Area (PHT): 5.77 cm     TR Peak grad:   21.9 mmHg MV Decel Time: 132 msec     TR Vmax:         234.00 cm/s MV E velocity: 145.50 cm/s                             SHUNTS                             Systemic VTI:  0.26 m                             Systemic Diam: 2.40 cm Charlton Haws MD Electronically signed by Charlton Haws MD Signature Date/Time: 08/11/2020/1:23:12 PM    Final      Assessment & Plan:   Amiodarone pulmonary toxicity Admitted 3/28-4/8 for Afib/flutter, developed SVT 220bpm. Placed on amiodarone infusion and underwent cardioversion on 3/30. Patient had persistent hypoxemia, CT chest showed patchy bilateral infiltrated predominantly lower lobes. Amiodarone discontinued d.t lung injury. Started on prednisone taper. Discharged on 10-15L oxygen.   He is doing significantly better today. He is requiring less oxygen. Needing 2L with ambulation. Continue prednisone ; taper  every 21 days. Bactrim DS MWF while on oral prednisone. If symptoms reoccur will need to go back to last successful dose and stay on prolonged taper for over 1 year. Checking CXR today to monitor infiltrates.   Recommendations: - Continue prednisone ; taper  wevery 3 weeks as directed  - Continue Bactrim DS three times a day  - Continue Advair 1 puff morning and evening (rinse mouth after use) - Recommend you use 2L oxygen with ambulation and at night   Orders: - CXR today   Follow-up: - With Dr. Marchelle Gearing 5/17    Acute on chronic combined systolic and diastolic CHF (congestive heart failure) (HCC) - Appears euvolemic on exam today - Continue Torsemide   daily   A-fib [facility]) - Patient underwent cardioversion 08/11/20. Amiodarone discontinued d/t lung toxicity. Continue Eliquis 5mg  BID and Carvedilol 18.75mg  BID    , NP 08/25/2020

## 2020-08-24 NOTE — Telephone Encounter (Addendum)
David Huynh does not do HFU as VVs.  Would you be able to do a virtual pretty soon? Can we fit him in on your schedule? He had surgery and unable to come in.

## 2020-08-24 NOTE — Telephone Encounter (Signed)
What surgery did he have. Agree with pneumonia and syncope in person would be best. I will be doing some in person Friday 4/22 in am if he could make that. Am willing to talk to him but in person is best

## 2020-08-25 ENCOUNTER — Other Ambulatory Visit: Payer: Self-pay

## 2020-08-25 ENCOUNTER — Ambulatory Visit (INDEPENDENT_AMBULATORY_CARE_PROVIDER_SITE_OTHER): Payer: Medicare Other

## 2020-08-25 ENCOUNTER — Ambulatory Visit (INDEPENDENT_AMBULATORY_CARE_PROVIDER_SITE_OTHER): Payer: Medicare Other | Admitting: Primary Care

## 2020-08-25 ENCOUNTER — Encounter: Payer: Self-pay | Admitting: Primary Care

## 2020-08-25 VITALS — BP 124/76 | HR 86 | Temp 97.3°F | Ht 72.0 in | Wt 264.4 lb

## 2020-08-25 DIAGNOSIS — I4891 Unspecified atrial fibrillation: Secondary | ICD-10-CM | POA: Diagnosis not present

## 2020-08-25 DIAGNOSIS — I517 Cardiomegaly: Secondary | ICD-10-CM | POA: Diagnosis not present

## 2020-08-25 DIAGNOSIS — J984 Other disorders of lung: Secondary | ICD-10-CM

## 2020-08-25 DIAGNOSIS — I5043 Acute on chronic combined systolic (congestive) and diastolic (congestive) heart failure: Secondary | ICD-10-CM

## 2020-08-25 DIAGNOSIS — T462X5A Adverse effect of other antidysrhythmic drugs, initial encounter: Secondary | ICD-10-CM

## 2020-08-25 NOTE — Assessment & Plan Note (Addendum)
Admitted 3/28-4/8 for Afib/flutter, developed SVT 220bpm. Placed on amiodarone infusion and underwent cardioversion on 3/30. Patient had persistent hypoxemia, CT chest showed patchy bilateral infiltrated predominantly lower lobes. Amiodarone discontinued d.t lung injury. Started on prednisone taper. Discharged on 10-15L oxygen.   He is doing significantly better today. He is requiring less oxygen. Needing 2L with ambulation. Continue prednisone 60mg ; taper 10mg  every 21 days. Bactrim DS MWF while on oral prednisone. If symptoms reoccur will need to go back to last successful dose and stay on prolonged taper for over 1 year. Checking CXR today to monitor infiltrates.   Recommendations: - Continue prednisone 60mg ; taper 10mg  wevery 3 weeks as directed  - Continue Bactrim DS three times a day  - Continue Advair 1 puff morning and evening (rinse mouth after use) - Recommend you use 2L oxygen with ambulation and at night   Orders: - CXR today   Follow-up: - With Dr. 5/17

## 2020-08-25 NOTE — Telephone Encounter (Signed)
Left message on machine to call back  We can do OV on 09/03/20.  Per Dr. Abner Greenspan would like to do OV rather than virtual visit if possible.

## 2020-08-25 NOTE — Patient Instructions (Addendum)
Recommendations: - Continue prednisone 60mg ; taper 10mg  wevery 3 weeks as directed  - Continue Bactrim DS three times a day  - Continue Advair 1 puff morning and evening (rinse mouth after use) - Recommend you use 2L oxygen with ambulation and at night   Orders: - CXR today  Follow-up: - With Dr. 5/17

## 2020-08-25 NOTE — Assessment & Plan Note (Addendum)
-   Patient underwent cardioversion 08/11/20. Amiodarone discontinued d/t lung toxicity. Continue Eliquis 5mg  BID and Carvedilol 18.75mg  BID

## 2020-08-25 NOTE — Assessment & Plan Note (Signed)
-   Appears euvolemic on exam today - Continue Torsemide 20mg  daily

## 2020-08-25 NOTE — Telephone Encounter (Signed)
Patient scheduled.

## 2020-08-26 ENCOUNTER — Telehealth: Payer: Medicare Other | Admitting: Medical

## 2020-08-30 ENCOUNTER — Encounter: Payer: Self-pay | Admitting: *Deleted

## 2020-08-31 ENCOUNTER — Encounter (HOSPITAL_COMMUNITY): Payer: Self-pay

## 2020-08-31 ENCOUNTER — Ambulatory Visit (HOSPITAL_COMMUNITY)
Admit: 2020-08-31 | Discharge: 2020-08-31 | Disposition: A | Discharge status: Home / Self Care | Payer: Medicare Other | Source: Ambulatory Visit | Attending: Adult Health | Admitting: Adult Health

## 2020-08-31 ENCOUNTER — Other Ambulatory Visit: Payer: Self-pay

## 2020-08-31 VITALS — BP 130/70 | HR 90 | Wt 262.2 lb

## 2020-08-31 DIAGNOSIS — I480 Paroxysmal atrial fibrillation: Secondary | ICD-10-CM | POA: Diagnosis not present

## 2020-08-31 DIAGNOSIS — I509 Heart failure, unspecified: Secondary | ICD-10-CM | POA: Diagnosis not present

## 2020-08-31 DIAGNOSIS — I080 Rheumatic disorders of both mitral and aortic valves: Secondary | ICD-10-CM | POA: Insufficient documentation

## 2020-08-31 DIAGNOSIS — Z79899 Other long term (current) drug therapy: Secondary | ICD-10-CM | POA: Diagnosis not present

## 2020-08-31 DIAGNOSIS — Z7901 Long term (current) use of anticoagulants: Secondary | ICD-10-CM | POA: Insufficient documentation

## 2020-08-31 DIAGNOSIS — I255 Ischemic cardiomyopathy: Secondary | ICD-10-CM | POA: Diagnosis not present

## 2020-08-31 DIAGNOSIS — I4892 Unspecified atrial flutter: Secondary | ICD-10-CM | POA: Insufficient documentation

## 2020-08-31 DIAGNOSIS — J9610 Chronic respiratory failure, unspecified whether with hypoxia or hypercapnia: Secondary | ICD-10-CM | POA: Insufficient documentation

## 2020-08-31 DIAGNOSIS — Z7902 Long term (current) use of antithrombotics/antiplatelets: Secondary | ICD-10-CM | POA: Diagnosis not present

## 2020-08-31 DIAGNOSIS — I2510 Atherosclerotic heart disease of native coronary artery without angina pectoris: Secondary | ICD-10-CM | POA: Insufficient documentation

## 2020-08-31 DIAGNOSIS — N183 Chronic kidney disease, stage 3 unspecified: Secondary | ICD-10-CM

## 2020-08-31 DIAGNOSIS — E1122 Type 2 diabetes mellitus with diabetic chronic kidney disease: Secondary | ICD-10-CM | POA: Diagnosis not present

## 2020-08-31 DIAGNOSIS — E785 Hyperlipidemia, unspecified: Secondary | ICD-10-CM | POA: Insufficient documentation

## 2020-08-31 DIAGNOSIS — I130 Hypertensive heart and chronic kidney disease with heart failure and stage 1 through stage 4 chronic kidney disease, or unspecified chronic kidney disease: Secondary | ICD-10-CM | POA: Diagnosis not present

## 2020-08-31 DIAGNOSIS — I5022 Chronic systolic (congestive) heart failure: Secondary | ICD-10-CM

## 2020-08-31 DIAGNOSIS — Z7984 Long term (current) use of oral hypoglycemic drugs: Secondary | ICD-10-CM | POA: Diagnosis not present

## 2020-08-31 DIAGNOSIS — Z955 Presence of coronary angioplasty implant and graft: Secondary | ICD-10-CM | POA: Diagnosis not present

## 2020-08-31 LAB — BASIC METABOLIC PANEL
Anion gap: 11 (ref 5–15)
BUN: 39 mg/dL — ABNORMAL HIGH (ref 8–23)
CO2: 24 mmol/L (ref 22–32)
Calcium: 8.8 mg/dL — ABNORMAL LOW (ref 8.9–10.3)
Chloride: 97 mmol/L — ABNORMAL LOW (ref 98–111)
Creatinine, Ser: 1.83 mg/dL — ABNORMAL HIGH (ref 0.61–1.24)
GFR, Estimated: 37 mL/min — ABNORMAL LOW (ref >60)
Glucose, Bld: 272 mg/dL — ABNORMAL HIGH (ref 70–99)
Potassium: 4.7 mmol/L (ref 3.5–5.1)
Sodium: 132 mmol/L — ABNORMAL LOW (ref 135–145)

## 2020-08-31 LAB — CBC
HCT: 40.8 % (ref 39.0–52.0)
Hemoglobin: 13.8 g/dL (ref 13.0–17.0)
MCH: 30.7 pg (ref 26.0–34.0)
MCHC: 33.8 g/dL (ref 30.0–36.0)
MCV: 90.9 fL (ref 80.0–100.0)
Platelets: 155 10*3/uL (ref 150–400)
RBC: 4.49 MIL/uL (ref 4.22–5.81)
RDW: 13 % (ref 11.5–15.5)
WBC: 15.2 10*3/uL — ABNORMAL HIGH (ref 4.0–10.5)
nRBC: 0 % (ref 0.0–0.2)

## 2020-08-31 LAB — BRAIN NATRIURETIC PEPTIDE: B Natriuretic Peptide: 124.8 pg/mL — ABNORMAL HIGH (ref 0.0–100.0)

## 2020-08-31 MED ORDER — CARVEDILOL 25 MG PO TABS
25.0000 mg | ORAL_TABLET | Freq: Two times a day (BID) | ORAL | 3 refills | Status: DC
Start: 2020-08-31 — End: 2020-10-07

## 2020-08-31 NOTE — Progress Notes (Signed)
[facility] Note   PCP: Bradd Canary, MD  Cardiologist: [name], MD  EP: Dr Johney Frame Pulmonary: Dr Jonny Ruiz is a 80 y.o. male with a history of CAD, chronic systolic CHF, HTN, HLD, DM, CKD 3, and asthma. Patient had EF 35-40% in 9/17, had cath with DES to RCA then repeat echo with EF up to 55-60% by 3/18.   Admitted 03/2018 with SOB x 3 weeks. No cardiology follow up in >1 year. Found to be in atrial fibrillation with RVR. EF down to 35-40%. Poor diuresis with IV lasix, so HF team consulted. He was started on amiodarone drip. PICC line placed with CVP markedly elevated and Coox marginal. Held off on inotropes with concern for making afib worse. Started on lasix drip. He started eliquis and had TEE/DCCV 03/29/18. Was converted to NSR, but shortly after went back into AF. Ranexa added. Attempted repeat DCCV 04/02/18, but was unsuccessful. EP consulted and recommended repeat LHC prior to repeat DCCV. Had [facility] 04/03/18 with PCI to proximal LCx.  He then had TEE-DCCV on 04/19/18 back to NSR. EF on TEE was 40%.  He saw Dr. Johney Frame to discuss ablation and opted to continue amiodarone.   Echo in 3/20 showed EF 50% with diffuse hypokinesis, normal RV, mild AS and mild MR.    He was lost to followup in this office again for over a year.  In 7/21, he was admitted for a couple of days with CHF exacerbation and was treated with IV Lasix.  Echo was done in 7/21, EF back down to 25-30%, global hypokinesis, severely decreased RV systolic function with severe RV enlargement, at least moderate aortic stenosis.  He was in NSR.    TEE was done in 10/21 for closer evaluation of aortic valve, this showed EF actually up to 50% with moderate AS and moderate MR.  LHC/RHC was done, showing normal filling pressures and significant LAD disease.  The patient is now s/p DES to LAD.   Admitted 3/28 with increased shortness of breath. Complicated by A fib/flutter. Initially on  amio drip. Had cardioversion on 3/30 with restoration of NSR. ECHO completed and showed EF 50-55%. Hospital course complicated by Acute hypoxic respiratory failure. CT chest with patchy consolidation consistent with PNA versus amiodarone toxicity. ESR 130. CCM consulted. Amiodarone stopped. Placed on solumedrol for amio toxicity. Discharged on 4 liters oxygen. Discharge date 08/20/20.    Had pulmonary follow up 08/25/20. CXR with improvement. Plan to continue prednisone 60mg ; taper 10mg  every 3 weeks as directed. Continue Bactrim DS three times a day + Advair 1 .   Today he returns for post hospital HF follow up.Overall feeling much better. Only wears oxygen as needed. Rarely short of breath. No issues with breathing today. Denies PND/Orthopnea. Appetite ok. No fever or chills. Weight at home has been stable. No bleeding issues. No chest pain.  Taking all medications. Has HHPT/RN  ECG. A Flutter 113 bpm   Labs (12/19): K 4.2, creatinine 1.35, LFTs normal Las (7/21): LDL 198, K 3.7, creatinine 1.75 Labs (8/21): K 4, creatinine 1.48, BNP 593 Labs (10/21): K 3.9, creatinine 1.28, LDL 230 (not taking statin), TSH normal, LFTs normal Labs (11/21): K 3.8, creatinine 1.34 Labs (1/22): K 4.5, creatinine 1.37, LDL 86, HDL 58, hgb 12.8, TSH normal, LFTs normal  SH: No tobacco or drug use. Rare ETOH use. Lives at home with his wife in [place]. He is a retired 07-26-1967.  FH: No family hx of CAD or CHF.   Review of systems complete and found to be negative unless listed in HPI.   PMH: 1. CKD: Stage 3.  2. Atrial fibrillation: Paroxysmal, poorly tolerated with history of tachycardia-mediated CMP.   - DCCV x 2 in 11/19 and once in 12/19.  3. Type II diabetes.  4. HTN 5. Hyperlipidemia.  6. CAD: DES to mid RCA in 2017.   - LHC (11/19): 50% mLAD, 50% dLAD, 80-85% proximal LCx stenosis, OM1 small vessel with 90% stenois. PCI to proximal LCx.  - LHC (10/21): 80% pLAD, 70% p/mLAD, 50% m/d LAD, 80% superior  branch OM2, 60% inferior branch OM2, 50% mPDA. PCI with DES to LAD.  7. Chronic systolic CHF: Suspect this was primarily tachy-cardia mediated.  - Echo (11/19): EF 35-40%, moderate to severely decreased RV systolic function.  - TEE (12/19): EF 40%, diffuse hypokinesis, normal RV size and systolic function.  - Echo (3/20): EF 50%, normal RV, mild MR, mild AS with mean gradient 14 mmHg. - Echo (7/21): EF 25-30%, global hypokinesis, severely decreased RV systolic function with severe RV enlargement, at least moderate aortic stenosis.   - TEE (10/21): EF 50%, anterior hypokinesis, basal inferior hypokinesis, normal RV, moderate AS with mean gradient 22 mmHg and AVA 1.18 cm^2, moderate MR with ERO 0.32 cm^2.  - RHC (10/21): mean RA 2, PA 29/4, mean PCWP 6, CI 2.66, easily crossed AoV with mean gradient 15 mmHg.  8. Aortic stenosis: Mild by 3/20 echo. 7/21 echo with at least moderate AS.    Current Outpatient Medications  Medication Sig Dispense Refill  . acetaminophen (TYLENOL) 500 MG tablet Take 1,000 mg by mouth every 8 (eight) hours as needed (for pain or headaches).    . ADVAIR DISKUS 100-50 MCG/DOSE AEPB INHALE 1 PUFF INTO THE LUNGS TWICE DAILY (Patient taking differently: Inhale 1 puff into the lungs 2 (two) times daily as needed (shortness of breath/wheezing).) 60 each 0  . atorvastatin (LIPITOR) 80 MG tablet Take 1 tablet (80 mg total) by mouth daily. 30 tablet 6  . carvedilol (COREG) 12.5 MG tablet Take 1.5 tablets (18.75 mg total) by mouth 2 (two) times daily with a meal. 270 tablet 3  . cholecalciferol (VITAMIN D) 1000 units tablet Take 1 tablet (1,000 Units total) by mouth daily. 30 tablet 0  . clopidogrel (PLAVIX) 75 MG tablet Take 1 tablet (75 mg total) by mouth daily. 90 tablet 3  . ELIQUIS 5 MG TABS tablet TAKE 1 TABLET(5 MG) BY MOUTH TWICE DAILY (Patient taking differently: Take 5 mg by mouth 2 (two) times daily.) 60 tablet 11  . empagliflozin (JARDIANCE) 10 MG TABS tablet Take 1  tablet (10 mg total) by mouth daily before breakfast. 30 tablet 11  . ENTRESTO 49-51 MG TAKE 1 TABLET BY MOUTH TWICE DAILY (Patient taking differently: Take 1 tablet by mouth 2 (two) times daily.) 60 tablet 11  . ezetimibe (ZETIA) 10 MG tablet Take 1 tablet (10 mg total) by mouth daily. 90 tablet 3  . glipiZIDE (GLUCOTROL) 5 MG tablet Take 1 tablet (5 mg total) by mouth daily before breakfast. 90 tablet 3  . icosapent Ethyl (VASCEPA) 1 g capsule Take 2 capsules (2 g total) by mouth 2 (two) times daily. 120 capsule 11  . metFORMIN (GLUCOPHAGE) 1000 MG tablet Take 1 tablet (1,000 mg total) by mouth daily with breakfast. 90 tablet 3  . nitroGLYCERIN (NITROSTAT) 0.4 MG SL tablet Place 1 tablet (0.4 mg total) under  the tongue every 5 (five) minutes as needed for chest pain. 30 tablet 0  . polyvinyl alcohol (LIQUIFILM TEARS) 1.4 % ophthalmic solution Place 1 drop into both eyes daily as needed for dry eyes (irritation).    . predniSONE (DELTASONE) 10 MG tablet Take 6 tablets for 18 days, then take 5 tablets for 21 days, then take 4 tablets for 21 days, then take 3 tablets daily for 21 days, then take 2 tablets daily for 21 days, then take 1 tablet daily for 21 days. 108 tablet 0  . ranolazine (RANEXA) 500 MG 12 hr tablet Take 1 tablet (500 mg total) by mouth 2 (two) times daily. 180 tablet 0  . sulfamethoxazole-trimethoprim (BACTRIM DS) 800-160 MG tablet Take 1 tablet by mouth 3 (three) times a week. 12 tablet 0  . torsemide (DEMADEX) 20 MG tablet Take 1 tablet (20 mg total) by mouth in the morning. Start taking on 08/22/20 30 tablet 0   No current facility-administered medications for this encounter.    No Known Allergies  Vitals:   08/31/20 1511  BP: 130/70  Pulse: 90  SpO2: 96%  Weight: 118.9 kg (262 lb 3.2 oz)   Wt Readings from Last 3 Encounters:  08/31/20 118.9 kg (262 lb 3.2 oz)  08/25/20 119.9 kg (264 lb 6.4 oz)  08/20/20 117.2 kg (258 lb 6.1 oz)   PHYSICAL EXAM: General: Ambulated  in the clinic with a walker.  No resp difficulty HEENT: normal Neck: supple. JVP 5-6  Carotids 2+ bilat; no bruits. No lymphadenopathy or thryomegaly appreciated. Cor: PMI nondisplaced. Irregular rate & rhythm. No rubs, gallops or murmurs. Lungs: clear Abdomen: soft, nontender, nondistended. No hepatosplenomegaly. No bruits or masses. Good bowel sounds. Extremities: no cyanosis, clubbing, rash, edema. RLE knee brace  Neuro: alert & orientedx3, cranial nerves grossly intact. moves all 4 extremities w/o difficulty. Affect pleasant  EKG: A flutter 113 bpm   ASSESSMENT & PLAN:  1. Chronic systolic CHF: Prior ischemic cardiomyopathy, improved after PCI in 2017. Echo 03/2018 with EF back down to 35-40% and moderate-severe RV dysfunction. This was in the setting of atrial fibrillation with RVR. EF 40% on TEE 04/2018.  Echo was in 3/20, showing EF up to 50%.  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  Echo in 7/21 with EF 25-30%, severe RV dysfunction.  TEE in 10/21, however, showed EF up to 50%.  He had PCI to LAD in 10/21.  Echo 08/12/20 with EF stable 50-55%, normal RV. NYHA II. Volume status stable. Continue torsemide 20 mg daily.  - Increase coreg 25 mg twice a day with A flutter.  - Continue  Jardiance 10 mg daily. - Continue Entresto 49/51 mg bid.  -Check BMET  2. Atrial fibrillation: As above, concerned for possible tachy-mediated CMP. Had successful DC-CV on 03/29/18 but then back in atrial fibrillation.Ranolazine added, but failed DCCV again 04/02/18. DCCV 04/19/18 was successful. - During recent hospitalization back in Afib. S/P Cardioversion 08/11/20. -He was takedn off amio due to suspected amio toxicity. In A flutter today.  - Currently on ranolazine 500 mg bid.  -- Increase coreg to 25 mg twice a day.  - Continue Eliquis 5 mg bid.  - Back in A flutter today. Rate 113. He has follow up with Dr Rayann Heman to discuss options on 09/06/20.  3. CKD: Stage 3  -  Check BMET   4. CAD: History of DES to RCA in 9/17. Underwent LHC 04/03/18 with PCI to prox  left circ. LHC in 10/21 with PCI to LAD.  No chest pain.  - He is on Eliquis so no ASA.   - Continue Plavix ideally x 1 yr (to 10/22).  - He is on atorvastatin 80 daily, with recent addition of Zetia.  5. DM2: Start Jardiance today. BMET in 10 days. 6. Aortic stenosis: Moderate on TEE in 10/21 and easily crossed at cath.  7. Mitral regurgitation: Moderate by TEE in 10/21.  8. Chronic Respiratory Failure  In the setting of amio toxicity 07/2020. He required HFNC for several days. Now on room air with stable saturations.  - On prednisone taper + bactrim per pulmonary.    Follow up with Dr Aundra Dubin in 4 weeks.      Darrick Grinder, NP- C  08/31/20

## 2020-08-31 NOTE — Patient Instructions (Addendum)
INCREASE Coreg to 25 mg,one tab TWICE  A DAY  Labs today We will only contact you if something comes back abnormal or we need to make some changes. Otherwise no news is good news!  Your physician recommends that you schedule a follow-up appointment in: 4 weeks with Dr Shirlee Latch  Do the following things EVERYDAY: 1) Weigh yourself in the morning before breakfast. Write it down and keep it in a log. 2) Take your medicines as prescribed 3) Eat low salt foods--Limit salt (sodium) to 2000 mg per day.  4) Stay as active as you can everyday 5) Limit all fluids for the day to less than 2 liters At the [facility], you and your health needs are our priority. As part of our continuing mission to provide you with exceptional heart care, we have created designated Provider Care Teams. These Care Teams include your primary Cardiologist (physician) and Advanced Practice Providers (APPs- Physician Assistants and Nurse Practitioners) who all work together to provide you with the care you need, when you need it.   You may see any of the following providers on your designated Care Team at your next follow up: Marland Kitchen Dr Arvilla Meres . Dr [name] . Dr Thornell Mule . Tonye Becket, NP . Robbie Lis, PA . Shanda Bumps [place],NP . Karle Plumber, PharmD   Please be sure to bring in all your medications bottles to every appointment.   If you have any questions or concerns before your next appointment please send [place] a message through [place] or call our office at [phone].    TO LEAVE A MESSAGE FOR THE NURSE SELECT OPTION 2, PLEASE LEAVE A MESSAGE INCLUDING: . YOUR NAME . DATE OF BIRTH . CALL BACK NUMBER . REASON FOR CALL**this is important as we prioritize the call backs  YOU WILL RECEIVE A CALL BACK THE SAME DAY AS LONG AS YOU CALL BEFORE 4:00 PM

## 2020-09-03 ENCOUNTER — Encounter: Payer: Self-pay | Admitting: Family Medicine

## 2020-09-03 ENCOUNTER — Ambulatory Visit (INDEPENDENT_AMBULATORY_CARE_PROVIDER_SITE_OTHER): Payer: Medicare Other | Admitting: Family Medicine

## 2020-09-03 ENCOUNTER — Telehealth: Payer: Self-pay | Admitting: *Deleted

## 2020-09-03 ENCOUNTER — Other Ambulatory Visit: Payer: Self-pay

## 2020-09-03 VITALS — BP 128/76 | HR 83 | Temp 97.8°F | Resp 20 | Wt 263.6 lb

## 2020-09-03 DIAGNOSIS — D72829 Elevated white blood cell count, unspecified: Secondary | ICD-10-CM | POA: Diagnosis not present

## 2020-09-03 DIAGNOSIS — N289 Disorder of kidney and ureter, unspecified: Secondary | ICD-10-CM | POA: Diagnosis not present

## 2020-09-03 DIAGNOSIS — R252 Cramp and spasm: Secondary | ICD-10-CM | POA: Diagnosis not present

## 2020-09-03 DIAGNOSIS — I255 Ischemic cardiomyopathy: Secondary | ICD-10-CM

## 2020-09-03 DIAGNOSIS — E1142 Type 2 diabetes mellitus with diabetic polyneuropathy: Secondary | ICD-10-CM

## 2020-09-03 LAB — MAGNESIUM: Magnesium: 2.8 mg/dL — ABNORMAL HIGH (ref 1.5–2.5)

## 2020-09-03 LAB — CBC WITH DIFFERENTIAL/PLATELET
Basophils Absolute: 0 10*3/uL (ref 0.0–0.1)
Basophils Relative: 0.2 % (ref 0.0–3.0)
Eosinophils Absolute: 0 10*3/uL (ref 0.0–0.7)
Eosinophils Relative: 0 % (ref 0.0–5.0)
HCT: 41.9 % (ref 39.0–52.0)
Hemoglobin: 13.8 g/dL (ref 13.0–17.0)
Lymphocytes Relative: 4.7 % — ABNORMAL LOW (ref 12.0–46.0)
Lymphs Abs: 0.6 10*3/uL — ABNORMAL LOW (ref 0.7–4.0)
MCHC: 32.9 g/dL (ref 30.0–36.0)
MCV: 92.5 fl (ref 78.0–100.0)
Monocytes Absolute: 0.3 10*3/uL (ref 0.1–1.0)
Monocytes Relative: 2.4 % — ABNORMAL LOW (ref 3.0–12.0)
Neutro Abs: 11.5 10*3/uL — ABNORMAL HIGH (ref 1.4–7.7)
Neutrophils Relative %: 92.7 % — ABNORMAL HIGH (ref 43.0–77.0)
Platelets: 116 10*3/uL — ABNORMAL LOW (ref 150.0–400.0)
RBC: 4.53 Mil/uL (ref 4.22–5.81)
RDW: 14.2 % (ref 11.5–15.5)
WBC: 12.4 10*3/uL — ABNORMAL HIGH (ref 4.0–10.5)

## 2020-09-03 LAB — COMPREHENSIVE METABOLIC PANEL
ALT: 38 U/L (ref 0–53)
AST: 10 U/L (ref 0–37)
Albumin: 3.6 g/dL (ref 3.5–5.2)
Alkaline Phosphatase: 65 U/L (ref 39–117)
BUN: 54 mg/dL — ABNORMAL HIGH (ref 6–23)
CO2: 21 mEq/L (ref 19–32)
Calcium: 8.8 mg/dL (ref 8.4–10.5)
Chloride: 95 mEq/L — ABNORMAL LOW (ref 96–112)
Creatinine, Ser: 2.5 mg/dL — ABNORMAL HIGH (ref 0.40–1.50)
GFR: 23.9 mL/min — ABNORMAL LOW (ref >60.00)
Glucose, Bld: 537 mg/dL (ref 70–99)
Potassium: 6.3 mEq/L (ref 3.5–5.1)
Sodium: 133 mEq/L — ABNORMAL LOW (ref 135–145)
Total Bilirubin: 1.1 mg/dL (ref 0.2–1.2)
Total Protein: 6.4 g/dL (ref 6.0–8.3)

## 2020-09-03 MED ORDER — INSULIN STARTER KIT- PEN NEEDLES (ENGLISH): 1.0000 | Freq: Once | Status: AC

## 2020-09-03 MED ORDER — INSULIN LISPRO 100 UNIT/ML ~~LOC~~ SOLN: 15.0000 [IU] | Freq: Three times a day (TID) | SUBCUTANEOUS | 11 refills | Status: DC

## 2020-09-03 MED ORDER — GLUCOSE BLOOD VI STRP: ORAL_STRIP | 12 refills | Status: AC

## 2020-09-03 NOTE — Telephone Encounter (Signed)
CRITICAL VALUE STICKER  CRITICAL VALUE: potassium 6.3 and glucose 537  RECEIVER (on-site recipient of call): David Huynh  DATE & TIME NOTIFIED: 09/03/20 1251pm  MESSENGER (representative from lab): saa  MD NOTIFIED: blyth  TIME OF NOTIFICATION: 1252pm

## 2020-09-03 NOTE — Telephone Encounter (Signed)
Per Dr. Abner Greenspan: pt is to start on humalog and novolog today on sliding scale. Pt is to get CMP drawn today if he can not he is to go to the ER.

## 2020-09-03 NOTE — Telephone Encounter (Signed)
Spoke with pt and gave him her recommendations and he refused to go to the lab and ER.

## 2020-09-03 NOTE — Patient Instructions (Signed)
Heart Failure, Diagnosis  Heart failure is a condition in which the heart has trouble pumping blood. This may mean that the heart cannot pump enough blood out to the body or that the heart does not fill up with enough blood. For some people with heart failure, fluid may back up into the lungs. There may also be swelling (edema) in the lower legs. Heart failure is usually a long-term (chronic) condition. It is important for you to take good care of yourself and follow the treatment plan from your health care provider. What are the causes? This condition may be caused by:  High blood pressure (hypertension). Hypertension causes the heart muscle to work harder than normal.  Coronary artery disease, or CAD. CAD is the buildup of cholesterol and fat (plaque) in the arteries of the heart.  Heart attack, also called myocardial infarction. This injures the heart muscle, making it hard for the heart to pump blood.  Abnormal heart valves. The valves do not open and close properly, forcing the heart to pump harder to keep the blood flowing.  Heart muscle disease, inflammation, or infection (cardiomyopathy or myocarditis). This is damage to the heart muscle. It can increase the risk of heart failure.  Lung disease. The heart works harder when the lungs are not healthy. What increases the risk? The risk of heart failure increases as a person ages. This condition is also more likely to develop in people who:  Are obese.  Are male.  Use tobacco or nicotine products.  Abuse alcohol or drugs.  Have taken medicines that can damage the heart, such as chemotherapy drugs.  Have any of these conditions: ? Diabetes. ? Abnormal heart rhythms. ? Thyroid problems. ? Low blood counts (anemia). ? Chronic kidney disease.  Have a family history of heart failure. What are the signs or symptoms? Symptoms of this condition include:  Shortness of breath with activity, such as when climbing stairs.  A cough  that does not go away.  Swelling of the feet, ankles, legs, or abdomen.  Losing or gaining weight for no reason.  Trouble breathing when lying flat.  Waking from sleep because of the need to sit up and get more air.  Rapid heartbeat.  Tiredness (fatigue) and loss of energy.  Feeling light-headed, dizzy, or close to fainting.  Nausea or loss of appetite.  Waking up more often during the night to urinate (nocturia).  Confusion. How is this diagnosed? This condition is diagnosed based on:  Your medical history, symptoms, and a physical exam.  Diagnostic tests, which may include: ? Echocardiogram. ? Electrocardiogram (ECG). ? Chest X-ray. ? Blood tests. ? Exercise stress test. ? Cardiac MRI. ? Cardiac catheterization and angiogram. ? Radionuclide scans. How is this treated? Treatment for this condition is aimed at managing the symptoms of heart failure. Medicines Treatment may include medicines that:  Help lower blood pressure by relaxing (dilating) the blood vessels. These medicines are called ACE inhibitors (angiotensin-converting enzyme), ARBs (angiotensin receptor blockers), or vasodilators.  Cause the kidneys to remove salt and water from the blood through urination (diuretics).  Improve heart muscle strength and prevent the heart from beating too fast (beta blockers).  Increase the force of the heartbeat (digoxin).  Lower heart rates. Certain diabetes medicines (SGLT-2 inhibitors) may also be used in treatment. Healthy behavior changes Treatment may also include making healthy lifestyle changes, such as:  Reaching and staying at a healthy weight.  Not using tobacco or nicotine products.  Eating heart-healthy foods.    Limiting or avoiding alcohol.  Stopping the use of illegal drugs.  Being physically active.  Participating in a cardiac rehabilitation program, which is a treatment program to improve your health and well-being through exercise training,  education, and counseling. Other treatments Other treatments may include:  Procedures to open blocked arteries or repair damaged valves.  Placing a pacemaker to improve heart function (cardiac resynchronization therapy).  Placing a device to treat serious abnormal heart rhythms (implantable cardioverter defibrillator, or ICD).  Placing a device to improve the pumping ability of the heart (left ventricular assist device, or LVAD).  Receiving a healthy heart from a donor (heart transplant). This is done when other treatments have not helped. Follow these instructions at home:  Manage other health conditions as told by your health care provider. These may include hypertension, diabetes, thyroid disease, or abnormal heart rhythms.  Get ongoing education and support as needed. Learn as much as you can about heart failure.  Keep all follow-up visits. This is important. Summary  Heart failure is a condition in which the heart has trouble pumping blood.  This condition is commonly caused by high blood pressure and other diseases of the heart and lungs.  Symptoms of this condition include shortness of breath, tiredness (fatigue), nausea, and swelling of the feet, ankles, legs, or abdomen.  Treatments for this condition may include medicines, lifestyle changes, and surgery.  Manage other health conditions as told by your health care provider. This information is not intended to replace advice given to you by your health care provider. Make sure you discuss any questions you have with your health care provider. Document Revised: 11/22/2019 Document Reviewed: 11/22/2019 Elsevier Patient Education  2021 [employer]  

## 2020-09-05 ENCOUNTER — Other Ambulatory Visit (INDEPENDENT_AMBULATORY_CARE_PROVIDER_SITE_OTHER): Payer: Medicare Other | Admitting: Family Medicine

## 2020-09-05 ENCOUNTER — Encounter: Payer: Self-pay | Admitting: Family Medicine

## 2020-09-05 DIAGNOSIS — I10 Essential (primary) hypertension: Secondary | ICD-10-CM

## 2020-09-05 DIAGNOSIS — E785 Hyperlipidemia, unspecified: Secondary | ICD-10-CM | POA: Diagnosis not present

## 2020-09-05 DIAGNOSIS — N179 Acute kidney failure, unspecified: Secondary | ICD-10-CM | POA: Diagnosis not present

## 2020-09-05 DIAGNOSIS — E1159 Type 2 diabetes mellitus with other circulatory complications: Secondary | ICD-10-CM

## 2020-09-05 DIAGNOSIS — N1831 Chronic kidney disease, stage 3a: Secondary | ICD-10-CM

## 2020-09-05 DIAGNOSIS — J9601 Acute respiratory failure with hypoxia: Secondary | ICD-10-CM | POA: Diagnosis not present

## 2020-09-05 DIAGNOSIS — I4891 Unspecified atrial fibrillation: Secondary | ICD-10-CM | POA: Diagnosis not present

## 2020-09-05 DIAGNOSIS — I5043 Acute on chronic combined systolic (congestive) and diastolic (congestive) heart failure: Secondary | ICD-10-CM | POA: Diagnosis not present

## 2020-09-05 DIAGNOSIS — E1142 Type 2 diabetes mellitus with diabetic polyneuropathy: Secondary | ICD-10-CM

## 2020-09-05 DIAGNOSIS — E875 Hyperkalemia: Secondary | ICD-10-CM

## 2020-09-05 MED ORDER — INSULIN LISPRO 100 UNIT/ML ~~LOC~~ SOLN: SUBCUTANEOUS | 0 refills | Status: DC

## 2020-09-05 NOTE — Assessment & Plan Note (Signed)
Complicated by amiodarone toxicity. He is following pulmonary now.

## 2020-09-05 NOTE — Assessment & Plan Note (Signed)
Well controlled, no changes to meds. Encouraged heart healthy diet such as the DASH diet and exercise as tolerated.  °

## 2020-09-05 NOTE — Assessment & Plan Note (Signed)
Tolerating statin, encouraged heart healthy diet, avoid trans fats, minimize simple carbs and saturated fats. Increase exercise as tolerated 

## 2020-09-05 NOTE — Progress Notes (Signed)
Subjective:    Patient ID: David Huynh, male    DOB: [birth year], 80 y.o.   MRN: [id number]  No chief complaint on file.   HPI Patient is in today for hospital follow up accompanied by his wife. He is feeling better but still endorses fatigue and sob. He has some pedal edema as well. His appetite is improving. He was hospitalized with acute exacerbation of diastolic heart failure, A fib, amiodarone toxicity with respiratory failure and more. He is following with cardiology and pulmonology now. Denies CP/palp/HA/congestion/fevers/GI or GU c/o. Taking meds as prescribed  Past Medical History:  Diagnosis Date  . Allergy    seasonal  . Asthma    mild, intermittent  . Atrial fibrillation with rapid ventricular response (HCC) 03/25/2018  . CAD, multiple vessel    Three-vessel disease involving mid RCA 85% (PCI with DES), OM 2 80% in branch and ostD1 ~90%. Only the mid RCA with PCI target.   . Carotid bruit 08/05/2010   Bruit noted on left   . Chronic combined systolic and diastolic CHF (congestive heart failure) (HCC)   . CKD (chronic kidney disease), stage III (HCC)   . Diabetes mellitus    type 2  . Fracture of multiple ribs 02/12/2015  . Hemothorax on right 01/23/2015  . Hyperlipidemia   . Hypertension   . Hypocalcemia 02/12/2015  . Lactic acidosis 03/25/2018  . Liver function study, abnormal 04/26/2011  . Multiple rib fractures   . Obesity   . Persistent atrial fibrillation (HCC)   . Pneumonia     Past Surgical History:  Procedure Laterality Date  . APPENDECTOMY  1975  . CARDIAC CATHETERIZATION N/A 02/17/2016   Procedure: Left Heart Cath and Coronary Angiography;  Surgeon: Marykay Lex, MD;  Location: [facility] INVASIVE CV LAB;  Service: Cardiovascular: mRCA 85% (PCI), branch of OM2 80%,  ostD1 90%; EF 35-45%  . CARDIAC CATHETERIZATION N/A 02/17/2016   Procedure: Coronary Stent Intervention;  Surgeon: Marykay Lex, MD;  Location: [facility] INVASIVE CV LAB;  Service: Cardiovascular:  mRCA PCI : STENT PROMUS PREM MR 3.5X28 DES  . CARDIOVERSION N/A 03/29/2018   Procedure: CARDIOVERSION;  Surgeon: Laurey Morale, MD;  Location: [facility] ENDOSCOPY;  Service: Cardiovascular;  Laterality: N/A;  . CARDIOVERSION N/A 04/02/2018   Procedure: CARDIOVERSION;  Surgeon: Laurey Morale, MD;  Location: [facility] ENDOSCOPY;  Service: Cardiovascular;  Laterality: N/A;  . CARDIOVERSION N/A 04/19/2018   Procedure: CARDIOVERSION;  Surgeon: Laurey Morale, MD;  Location: [facility] ENDOSCOPY;  Service: Cardiovascular;  Laterality: N/A;  . CARDIOVERSION N/A 08/11/2020   Procedure: CARDIOVERSION;  Surgeon: Laurey Morale, MD;  Location: [facility] ENDOSCOPY;  Service: Cardiovascular;  Laterality: N/A;  . CATARACT EXTRACTION  2010   b/l  . CORONARY STENT INTERVENTION N/A 04/03/2018   Procedure: CORONARY STENT INTERVENTION;  Surgeon: Lennette Bihari, MD;  Location: [facility] INVASIVE CV LAB;  Service: Cardiovascular;  Laterality: N/A;  . CORONARY STENT INTERVENTION N/A 03/02/2020   Procedure: CORONARY STENT INTERVENTION;  Surgeon: Corky Crafts, MD;  Location: [facility] INVASIVE CV LAB;  Service: Cardiovascular;  Laterality: N/A;  . INTRAVASCULAR ULTRASOUND/IVUS N/A 03/02/2020   Procedure: Intravascular Ultrasound/IVUS;  Surgeon: Corky Crafts, MD;  Location: [facility] INVASIVE CV LAB;  Service: Cardiovascular;  Laterality: N/A;  . RETINAL LASER PROCEDURE  1985  . RIGHT/LEFT HEART CATH AND CORONARY ANGIOGRAPHY N/A 04/03/2018   Procedure: RIGHT/LEFT HEART CATH AND CORONARY ANGIOGRAPHY;  Surgeon: Laurey Morale, MD;  Location: [facility] INVASIVE CV LAB;  Service: Cardiovascular;  Laterality: N/A;  . RIGHT/LEFT HEART CATH AND CORONARY ANGIOGRAPHY N/A 03/02/2020   Procedure: RIGHT/LEFT HEART CATH AND CORONARY ANGIOGRAPHY;  Surgeon: Larey Dresser, MD;  Location: [place] CV LAB;  Service: Cardiovascular;  Laterality: N/A;  . TEE WITHOUT CARDIOVERSION N/A 03/29/2018   Procedure: TRANSESOPHAGEAL ECHOCARDIOGRAM (TEE);  Surgeon:  Larey Dresser, MD;  Location: [facility] ENDOSCOPY;  Service: Cardiovascular;  Laterality: N/A;  . TEE WITHOUT CARDIOVERSION N/A 04/19/2018   Procedure: TRANSESOPHAGEAL ECHOCARDIOGRAM (TEE);  Surgeon: Larey Dresser, MD;  Location: [facility] ENDOSCOPY;  Service: Cardiovascular;  Laterality: N/A;  . TEE WITHOUT CARDIOVERSION N/A 03/02/2020   Procedure: TRANSESOPHAGEAL ECHOCARDIOGRAM (TEE);  Surgeon: Larey Dresser, MD;  Location: [facility] ENDOSCOPY;  Service: Cardiovascular;  Laterality: N/A;  . TONSILLECTOMY AND ADENOIDECTOMY  1947  . TRANSTHORACIC ECHOCARDIOGRAM  02/2016   Mild LVH. Moderately reduced EF of 35-40% with diffuse hypokinesis. Indeterminate diastolic function. Likely elevated LA pressures. Mild aortic stenosis (mean gradient 10 mmHg). Mild MR. Mild LA dilation. Mild to moderate PA hypertension with 39 mmHg  . VIDEO ASSISTED THORACOSCOPY (VATS)/THOROCOTOMY Right 01/24/2015   Procedure: VIDEO ASSISTED THORACOSCOPY (VATS) FOR DRAINAGE OF RIGHT HEMOTHORAX;  Surgeon: Gaye Pollack, MD;  Location: [facility] OR;  Service: Thoracic;  Laterality: Right;    Family History  Problem Relation Age of Onset  . CAD Neg Hx     Social History   Socioeconomic History  . Marital status: Married    Spouse name: Not on file  . Number of children: Not on file  . Years of education: Not on file  . Highest education level: Not on file  Occupational History  . Not on file  Tobacco Use  . Smoking status: Never Smoker  . Smokeless tobacco: Never Used  Vaping Use  . Vaping Use: Never used  Substance and Sexual Activity  . Alcohol use: Yes    Comment: rare use now  . Drug use: No  . Sexual activity: Not on file  Other Topics Concern  . Not on file  Social History Narrative  . Not on file   Social Determinants of Health   Financial Resource Strain: Not on file  Food Insecurity: Not on file  Transportation Needs: Not on file  Physical Activity: Not on file  Stress: Not on file  Social Connections: Not on  file  Intimate Partner Violence: Not on file    Outpatient Medications Prior to Visit  Medication Sig Dispense Refill  . acetaminophen (TYLENOL) 500 MG tablet Take 1,000 mg by mouth every 8 (eight) hours as needed (for pain or headaches).    . ADVAIR DISKUS 100-50 MCG/DOSE AEPB INHALE 1 PUFF INTO THE LUNGS TWICE DAILY (Patient taking differently: Inhale 1 puff into the lungs 2 (two) times daily as needed (shortness of breath/wheezing).) 60 each 0  . atorvastatin (LIPITOR) 80 MG tablet Take 1 tablet (80 mg total) by mouth daily. 30 tablet 6  . carvedilol (COREG) 25 MG tablet Take 1 tablet (25 mg total) by mouth 2 (two) times daily with a meal. 180 tablet 3  . cholecalciferol (VITAMIN D) 1000 units tablet Take 1 tablet (1,000 Units total) by mouth daily. 30 tablet 0  . clopidogrel (PLAVIX) 75 MG tablet Take 1 tablet (75 mg total) by mouth daily. 90 tablet 3  . ELIQUIS 5 MG TABS tablet TAKE 1 TABLET(5 MG) BY MOUTH TWICE DAILY (Patient taking differently: Take 5 mg by mouth 2 (two) times daily.) 60 tablet 11  . empagliflozin (  JARDIANCE) 10 MG TABS tablet Take 1 tablet (10 mg total) by mouth daily before breakfast. 30 tablet 11  . ENTRESTO 49-51 MG TAKE 1 TABLET BY MOUTH TWICE DAILY (Patient taking differently: Take 1 tablet by mouth 2 (two) times daily.) 60 tablet 11  . ezetimibe (ZETIA) 10 MG tablet Take 1 tablet (10 mg total) by mouth daily. 90 tablet 3  . glipiZIDE (GLUCOTROL) 5 MG tablet Take 1 tablet (5 mg total) by mouth daily before breakfast. 90 tablet 3  . glucose blood test strip Use as instructed 100 each 12  . icosapent Ethyl (VASCEPA) 1 g capsule Take 2 capsules (2 g total) by mouth 2 (two) times daily. 120 capsule 11  . metFORMIN (GLUCOPHAGE) 1000 MG tablet Take 1 tablet (1,000 mg total) by mouth daily with breakfast. 90 tablet 3  . nitroGLYCERIN (NITROSTAT) 0.4 MG SL tablet Place 1 tablet (0.4 mg total) under the tongue every 5 (five) minutes as needed for chest pain. 30 tablet 0  .  polyvinyl alcohol (LIQUIFILM TEARS) 1.4 % ophthalmic solution Place 1 drop into both eyes daily as needed for dry eyes (irritation).    . predniSONE (DELTASONE) 10 MG tablet Take 6 tablets for 18 days, then take 5 tablets for 21 days, then take 4 tablets for 21 days, then take 3 tablets daily for 21 days, then take 2 tablets daily for 21 days, then take 1 tablet daily for 21 days. 108 tablet 0  . ranolazine (RANEXA) 500 MG 12 hr tablet Take 1 tablet (500 mg total) by mouth 2 (two) times daily. 180 tablet 0  . torsemide (DEMADEX) 20 MG tablet Take 1 tablet (20 mg total) by mouth in the morning. Start taking on 08/22/20 30 tablet 0  . insulin lispro (HUMALOG) 100 UNIT/ML injection Inject 0.15 mLs (15 Units total) into the skin 3 (three) times daily before meals. 10 mL 11   Facility-Administered Medications Prior to Visit  Medication Dose Route Frequency Provider Last Rate Last Admin  . insulin starter kit- pen needles (English) 1 kit  1 kit Other Once [name], MD        No Known Allergies  Review of Systems  Constitutional: Positive for malaise/fatigue. Negative for fever.  HENT: Negative for congestion.   Eyes: Negative for blurred vision.  Respiratory: Positive for shortness of breath. Negative for hemoptysis.   Cardiovascular: Positive for leg swelling. Negative for chest pain and palpitations.  Gastrointestinal: Negative for abdominal pain, blood in stool and nausea.  Genitourinary: Negative for dysuria and frequency.  Musculoskeletal: Negative for falls.  Skin: Negative for rash.  Neurological: Negative for dizziness, loss of consciousness and headaches.  Endo/Heme/Allergies: Negative for environmental allergies.  Psychiatric/Behavioral: Negative for depression. The patient is not nervous/anxious.        Objective:    Physical Exam Vitals and nursing note reviewed.  Constitutional:      General: He is not in acute distress.    Appearance: He is well-developed.  HENT:      Head: Normocephalic and atraumatic.     Nose: Nose normal.  Eyes:     General:        Right eye: No discharge.        Left eye: No discharge.  Cardiovascular:     Rate and Rhythm: Normal rate. Rhythm irregular.     Heart sounds: Murmur heard.    Pulmonary:     Effort: Pulmonary effort is normal.     Breath sounds: Normal  breath sounds.  Abdominal:     General: Bowel sounds are normal.     Palpations: Abdomen is soft.     Tenderness: There is no abdominal tenderness.  Musculoskeletal:     Cervical back: Normal range of motion and neck supple.     Right lower leg: Edema present.     Left lower leg: Edema present.  Skin:    General: Skin is warm and dry.  Neurological:     Mental Status: He is alert and oriented to person, place, and time.     There were no vitals taken for this visit. Wt Readings from Last 3 Encounters:  09/03/20 263 lb 9.6 oz (119.6 kg)  08/31/20 262 lb 3.2 oz (118.9 kg)  08/25/20 264 lb 6.4 oz (119.9 kg)    Diabetic Foot Exam - Simple   No data filed    Lab Results  Component Value Date   WBC 12.4 (H) 09/03/2020   HGB 13.8 09/03/2020   HCT 41.9 09/03/2020   PLT 116.0 (L) 09/03/2020   GLUCOSE 537 (HH) 09/03/2020   CHOL 145 08/02/2020   TRIG 189 (H) 08/02/2020   HDL 48 08/02/2020   LDLDIRECT 133 (H) 12/25/2016   LDLCALC 59 08/02/2020   ALT 38 09/03/2020   AST 10 09/03/2020   NA 133 (L) 09/03/2020   K 6.3 (HH) 09/03/2020   CL 95 (L) 09/03/2020   CREATININE 2.50 (H) 09/03/2020   BUN 54 (H) 09/03/2020   CO2 21 09/03/2020   TSH 1.186 06/03/2020   INR 0.98 02/17/2016   HGBA1C 6.5 (A) 05/25/2020   MICROALBUR 17.6 (H) 04/17/2011    Lab Results  Component Value Date   TSH 1.186 06/03/2020   Lab Results  Component Value Date   WBC 12.4 (H) 09/03/2020   HGB 13.8 09/03/2020   HCT 41.9 09/03/2020   MCV 92.5 09/03/2020   PLT 116.0 (L) 09/03/2020   Lab Results  Component Value Date   NA 133 (L) 09/03/2020   K 6.3 (HH) 09/03/2020    CO2 21 09/03/2020   GLUCOSE 537 (HH) 09/03/2020   BUN 54 (H) 09/03/2020   CREATININE 2.50 (H) 09/03/2020   BILITOT 1.1 09/03/2020   ALKPHOS 65 09/03/2020   AST 10 09/03/2020   ALT 38 09/03/2020   PROT 6.4 09/03/2020   ALBUMIN 3.6 09/03/2020   CALCIUM 8.8 09/03/2020   ANIONGAP 11 08/31/2020   GFR 23.90 (L) 09/03/2020   Lab Results  Component Value Date   CHOL 145 08/02/2020   Lab Results  Component Value Date   HDL 48 08/02/2020   Lab Results  Component Value Date   LDLCALC 59 08/02/2020   Lab Results  Component Value Date   TRIG 189 (H) 08/02/2020   Lab Results  Component Value Date   CHOLHDL 3.0 08/02/2020   Lab Results  Component Value Date   HGBA1C 6.5 (A) 05/25/2020       Assessment & Plan:   Problem List Items Addressed This Visit    Hyperlipidemia with target LDL less than 70 (Chronic)    Tolerating statin, encouraged heart healthy diet, avoid trans fats, minimize simple carbs and saturated fats. Increase exercise as tolerated      Essential hypertension (Chronic)    Well controlled, no changes to meds. Encouraged heart healthy diet such as the DASH diet and exercise as tolerated.       A-fib (HCC)    Rate cotnrolled, tolerating Eliquis.       Acute  on chronic combined systolic (congestive) and diastolic (congestive) heart failure [facility])    He is following with cardiology and is still improving after a recent hospitalization. His BNP was still elevated to they increased his diuretic      Type 2 diabetes mellitus with diabetic polyneuropathy, without long-term current use of insulin (HCC)    Sugar elevated and needs to repeat cmp and start Humalog sliding scale tid and qhs. Minimize simple carbohydrates.       Relevant Medications   insulin lispro (HUMALOG) 100 UNIT/ML injection   RESOLVED: Diabetes mellitus (HCC)   Relevant Medications   insulin lispro (HUMALOG) 100 UNIT/ML injection   Acute-on-chronic kidney injury ([place])    Worsening, he is  asked to go to ER or to a least repeat a cmp urgently today but he refused. Will try and get him to repeat labs. Hydrate well      Hyperkalemia    He was asked to return for stat CMP or to go to ER but patient refused. Minimize potassium in diet and seek care if symptoms develop         I have discontinued Ashanti E. Merkin's insulin lispro. I have also changed his insulin lispro. Additionally, I am having him maintain his acetaminophen, nitroGLYCERIN, cholecalciferol, atorvastatin, glipiZIDE, metFORMIN, Eliquis, clopidogrel, icosapent Ethyl, ezetimibe, Advair Diskus, ranolazine, empagliflozin, Entresto, polyvinyl alcohol, torsemide, predniSONE, carvedilol, and glucose blood. We will continue to administer insulin starter kit- pen needles.  Meds ordered this encounter  Medications  . DISCONTD: insulin lispro (HUMALOG) 100 UNIT/ML injection    Sig: Check BS tid with meals and qhs 0-200 no units, 201-250 2 units, 251-300 4 units, 301-350 6 units, 351-400 8 units, 401-450 10 units, call MD >450    Dispense:  10 mL    Refill:  0  . insulin lispro (HUMALOG) 100 UNIT/ML injection    Sig: Check BS tid with meals and qhs 0-200 no units, 201-250 2 units, 251-300 4 units, 301-350 6 units, 351-400 8 units, 401-450 10 units, call MD >450    Dispense:  10 mL    Refill:  0     Penni Homans, MD

## 2020-09-05 NOTE — Assessment & Plan Note (Addendum)
Rate cotnrolled, tolerating Eliquis. Was struggling with RVR but is rate controlled now

## 2020-09-05 NOTE — Assessment & Plan Note (Deleted)
Sugar elevated and needs to repeat cmp and start Humalog sliding scale tid and qhs. Minimize simple carbohydrates.  

## 2020-09-05 NOTE — Assessment & Plan Note (Signed)
He was asked to return for stat CMP or to go to ER but patient refused. Minimize potassium in diet and seek care if symptoms develop

## 2020-09-05 NOTE — Assessment & Plan Note (Signed)
He is following with cardiology and is still improving after a recent hospitalization. His BNP was still elevated to they increased his diuretic

## 2020-09-05 NOTE — Assessment & Plan Note (Signed)
Worsening, he is asked to go to ER or to a least repeat a cmp urgently today but he refused. Will try and get him to repeat labs. Hydrate well

## 2020-09-05 NOTE — Assessment & Plan Note (Signed)
Sugar elevated and needs to repeat cmp and start Humalog sliding scale tid and qhs. Minimize simple carbohydrates.

## 2020-09-06 ENCOUNTER — Ambulatory Visit: Payer: Medicare Other | Admitting: Internal Medicine

## 2020-09-06 ENCOUNTER — Other Ambulatory Visit: Payer: Self-pay

## 2020-09-06 ENCOUNTER — Other Ambulatory Visit: Payer: Self-pay | Admitting: Family Medicine

## 2020-09-06 DIAGNOSIS — I5022 Chronic systolic (congestive) heart failure: Secondary | ICD-10-CM

## 2020-09-06 DIAGNOSIS — I4819 Other persistent atrial fibrillation: Secondary | ICD-10-CM

## 2020-09-06 DIAGNOSIS — E875 Hyperkalemia: Secondary | ICD-10-CM

## 2020-09-06 DIAGNOSIS — E161 Other hypoglycemia: Secondary | ICD-10-CM | POA: Diagnosis not present

## 2020-09-06 DIAGNOSIS — I43 Cardiomyopathy in diseases classified elsewhere: Secondary | ICD-10-CM

## 2020-09-06 DIAGNOSIS — D72829 Elevated white blood cell count, unspecified: Secondary | ICD-10-CM

## 2020-09-06 DIAGNOSIS — R0902 Hypoxemia: Secondary | ICD-10-CM | POA: Diagnosis not present

## 2020-09-06 DIAGNOSIS — E162 Hypoglycemia, unspecified: Secondary | ICD-10-CM | POA: Diagnosis not present

## 2020-09-06 DIAGNOSIS — R404 Transient alteration of awareness: Secondary | ICD-10-CM | POA: Diagnosis not present

## 2020-09-06 DIAGNOSIS — I4891 Unspecified atrial fibrillation: Secondary | ICD-10-CM | POA: Diagnosis not present

## 2020-09-06 NOTE — Telephone Encounter (Signed)
Pt is asking for more Prednisone please advise on the refills. He was last seen on 4/22/2. Is it appropriate to give him additional refills.

## 2020-09-06 NOTE — Telephone Encounter (Signed)
Patient is calling in reference to predniSONE (DELTASONE) 10 MG tablet [[id number]]  Patient needs medication sent in today   Please advise

## 2020-09-06 NOTE — Telephone Encounter (Signed)
Looks like he was supposed to drop his dose to Prednisone 10 mg tabs, 1 tab a day and then stop. I am willing to send in some 5 mg tabs for him to taper a little more. Can have him take 1 tab po daily x 10 days then 1/2 tab po daily x 10 days then 1/2 tab qod x 10 days. If he agrees send in #20

## 2020-09-07 ENCOUNTER — Other Ambulatory Visit: Payer: Medicare Other

## 2020-09-07 ENCOUNTER — Other Ambulatory Visit: Payer: Self-pay

## 2020-09-07 ENCOUNTER — Ambulatory Visit: Payer: Medicare Other | Admitting: Family Medicine

## 2020-09-07 ENCOUNTER — Other Ambulatory Visit (INDEPENDENT_AMBULATORY_CARE_PROVIDER_SITE_OTHER): Payer: Medicare Other

## 2020-09-07 DIAGNOSIS — D72829 Elevated white blood cell count, unspecified: Secondary | ICD-10-CM | POA: Diagnosis not present

## 2020-09-07 DIAGNOSIS — E875 Hyperkalemia: Secondary | ICD-10-CM

## 2020-09-07 DIAGNOSIS — E1142 Type 2 diabetes mellitus with diabetic polyneuropathy: Secondary | ICD-10-CM

## 2020-09-07 MED ORDER — PREDNISONE 5 MG PO TBEC: DELAYED_RELEASE_TABLET | ORAL | 0 refills | Status: DC

## 2020-09-07 NOTE — Telephone Encounter (Signed)
According to the direction on the bottle TAKE 6 TABLETS DAILY X 18 DAYS, 5 TABLETS X 21 DAYS, 4 TABLETS X 21 DAYS, 3 TABLETS X 21 DAYS, 2 TABLETS X 21 DAYS, 1 TABLET X 21 DAYS  Only 108 pills were dispensed. He begins on 08/21/20, today is the 18 days. In order for patient to finish his treatment he needs the rest of the pills highlighted in red above.

## 2020-09-07 NOTE — Telephone Encounter (Signed)
Got it, did not do the math just assumed they gave him enough. Yes please give him enough to finish the course and send in the 5 s as prescribed to taper more slowly since they put him on it for so long.

## 2020-09-07 NOTE — Progress Notes (Signed)
Patient came in for diabetic teaching on using Humalog insulin.  Patient was given medication without proper instructions.  He gave himself too much insulin and had 2 very low episodes to where the ambulance had to come out.  The insulin syringe that was given was the 100u syringe and patient could not read numbers.  Went over this with patient and advised that each line on the syringe is a 2 and he should not be going pass 10u.

## 2020-09-07 NOTE — Telephone Encounter (Signed)
Pt got labs done and insulin education today

## 2020-09-07 NOTE — Telephone Encounter (Signed)
Per Dr. Abner Greenspan send in the rest of the tablets to finish the 10 mg taper then send in the 5mg  taper  Patient advised and rxs sent in.

## 2020-09-08 ENCOUNTER — Other Ambulatory Visit: Payer: Self-pay | Admitting: Family Medicine

## 2020-09-08 DIAGNOSIS — N1831 Chronic kidney disease, stage 3a: Secondary | ICD-10-CM

## 2020-09-08 LAB — COMPREHENSIVE METABOLIC PANEL
ALT: 32 U/L (ref 0–53)
AST: 12 U/L (ref 0–37)
Albumin: 3.7 g/dL (ref 3.5–5.2)
Alkaline Phosphatase: 56 U/L (ref 39–117)
BUN: 65 mg/dL — ABNORMAL HIGH (ref 6–23)
CO2: 20 mEq/L (ref 19–32)
Calcium: 8.8 mg/dL (ref 8.4–10.5)
Chloride: 96 mEq/L (ref 96–112)
Creatinine, Ser: 2.41 mg/dL — ABNORMAL HIGH (ref 0.40–1.50)
GFR: 24.97 mL/min — ABNORMAL LOW (ref >60.00)
Glucose, Bld: 409 mg/dL — ABNORMAL HIGH (ref 70–99)
Potassium: 6 mEq/L — ABNORMAL HIGH (ref 3.5–5.1)
Sodium: 131 mEq/L — ABNORMAL LOW (ref 135–145)
Total Bilirubin: 1.1 mg/dL (ref 0.2–1.2)
Total Protein: 6.4 g/dL (ref 6.0–8.3)

## 2020-09-08 LAB — CBC WITH DIFFERENTIAL/PLATELET
Basophils Absolute: 0 10*3/uL (ref 0.0–0.1)
Basophils Relative: 0.2 % (ref 0.0–3.0)
Eosinophils Absolute: 0 10*3/uL (ref 0.0–0.7)
Eosinophils Relative: 0 % (ref 0.0–5.0)
HCT: 41.2 % (ref 39.0–52.0)
Hemoglobin: 13.9 g/dL (ref 13.0–17.0)
Lymphocytes Relative: 4.7 % — ABNORMAL LOW (ref 12.0–46.0)
Lymphs Abs: 0.4 10*3/uL — ABNORMAL LOW (ref 0.7–4.0)
MCHC: 33.6 g/dL (ref 30.0–36.0)
MCV: 94.4 fl (ref 78.0–100.0)
Monocytes Absolute: 0.5 10*3/uL (ref 0.1–1.0)
Monocytes Relative: 5.2 % (ref 3.0–12.0)
Neutro Abs: 8 10*3/uL — ABNORMAL HIGH (ref 1.4–7.7)
Neutrophils Relative %: 89.9 % — ABNORMAL HIGH (ref 43.0–77.0)
Platelets: 120 10*3/uL — ABNORMAL LOW (ref 150.0–400.0)
RBC: 4.37 Mil/uL (ref 4.22–5.81)
RDW: 14.6 % (ref 11.5–15.5)
WBC: 8.9 10*3/uL (ref 4.0–10.5)

## 2020-09-08 MED ORDER — SODIUM POLYSTYRENE SULFONATE 15 GM/60ML PO SUSP: 15.0000 g | Freq: Every day | ORAL | 0 refills | Status: DC | PRN

## 2020-09-09 ENCOUNTER — Telehealth: Payer: Self-pay | Admitting: *Deleted

## 2020-09-09 ENCOUNTER — Encounter: Payer: Self-pay | Admitting: Family Medicine

## 2020-09-09 NOTE — Telephone Encounter (Signed)
Called and followed up with patient.  He stated that he did get the rest of his prednisone and he is taking the correct dose of humalog.

## 2020-09-10 ENCOUNTER — Telehealth: Payer: Self-pay | Admitting: Family Medicine

## 2020-09-10 NOTE — Telephone Encounter (Signed)
Will watch out for readings.

## 2020-09-10 NOTE — Telephone Encounter (Signed)
Just a FYI

## 2020-09-10 NOTE — Telephone Encounter (Signed)
Wilford Sports from [facility], [phone]  OT, evaluation move to the  week of the  9th

## 2020-09-11 ENCOUNTER — Encounter: Payer: Self-pay | Admitting: Family Medicine

## 2020-09-13 MED ORDER — LANTUS SOLOSTAR 100 UNIT/ML ~~LOC~~ SOPN: 10.0000 [IU] | PEN_INJECTOR | Freq: Every day | SUBCUTANEOUS | 0 refills | Status: DC

## 2020-09-13 MED ORDER — PEN NEEDLES 32G X 4 MM MISC: 0 refills | Status: DC

## 2020-09-13 NOTE — Telephone Encounter (Signed)
Spoke with pt on the phone.  Advised the patient about medication. Rxs sent in. Also advised that he ask the pharmacist how to use or come see [place] here.

## 2020-09-15 ENCOUNTER — Encounter: Payer: Self-pay | Admitting: *Deleted

## 2020-09-15 ENCOUNTER — Ambulatory Visit (INDEPENDENT_AMBULATORY_CARE_PROVIDER_SITE_OTHER): Payer: Medicare Other | Admitting: Internal Medicine

## 2020-09-15 ENCOUNTER — Other Ambulatory Visit: Payer: Self-pay

## 2020-09-15 ENCOUNTER — Encounter: Payer: Self-pay | Admitting: Internal Medicine

## 2020-09-15 VITALS — BP 126/70 | HR 101 | Ht 72.0 in | Wt 263.0 lb

## 2020-09-15 DIAGNOSIS — R000 Tachycardia, unspecified: Secondary | ICD-10-CM

## 2020-09-15 DIAGNOSIS — I5022 Chronic systolic (congestive) heart failure: Secondary | ICD-10-CM | POA: Diagnosis not present

## 2020-09-15 DIAGNOSIS — I4819 Other persistent atrial fibrillation: Secondary | ICD-10-CM

## 2020-09-15 DIAGNOSIS — I255 Ischemic cardiomyopathy: Secondary | ICD-10-CM

## 2020-09-15 DIAGNOSIS — D6869 Other thrombophilia: Secondary | ICD-10-CM

## 2020-09-15 DIAGNOSIS — I43 Cardiomyopathy in diseases classified elsewhere: Secondary | ICD-10-CM | POA: Diagnosis not present

## 2020-09-15 DIAGNOSIS — I4892 Unspecified atrial flutter: Secondary | ICD-10-CM

## 2020-09-15 DIAGNOSIS — I2510 Atherosclerotic heart disease of native coronary artery without angina pectoris: Secondary | ICD-10-CM

## 2020-09-15 NOTE — Patient Instructions (Addendum)
Medication Instructions:  Stop Plavix Your physician recommends that you continue on your current medications as directed. Please refer to the Current Medication list given to you today.  Labwork: None ordered.  Testing/Procedures: Your physician has recommended that you have an ablation. Catheter ablation is a medical procedure used to treat some cardiac arrhythmias (irregular heartbeats). During catheter ablation, a long, thin, flexible tube is put into a blood vessel in your groin (upper thigh), or neck. This tube is called an ablation catheter. It is then guided to your heart through the blood vessel. Radio frequency waves destroy small areas of heart tissue where abnormal heartbeats may cause an arrhythmia to start. Please see the instruction sheet given to you today.  Your physician has requested that you have a TEE. During a TEE, sound waves are used to create images of your heart. It provides your doctor with information about the size and shape of your heart and how well your heart's chambers and valves are working. In this test, a transducer is attached to the end of a flexible tube that's guided down your throat and into your esophagus (the tube leading from you mouth to your stomach) to get a more detailed image of your heart. You are not awake for the procedure. Please see the instruction sheet given to you today. For further information please visit [email or web address]   Any Other Special Instructions Will Be Listed Below (If Applicable).  If you need a refill on your cardiac medications before your next appointment, please call your pharmacy.    Cardiac Ablation Cardiac ablation is a procedure to destroy (ablate) some heart tissue that is sending bad signals. These bad signals cause problems in heart rhythm. The heart has many areas that make these signals. If there are problems in these areas, they can make the heart beat in a way that is not normal. Destroying some tissues can  help make the heart rhythm normal. Tell your doctor about:  Any allergies you have.  All medicines you are taking. These include vitamins, herbs, eye drops, creams, and over-the-counter medicines.  Any problems you or family members have had with medicines that make you fall asleep (anesthetics).  Any blood disorders you have.  Any surgeries you have had.  Any medical conditions you have, such as kidney failure.  Whether you are pregnant or may be pregnant. What are the risks? This is a safe procedure. But problems may occur, including:  Infection.  Bruising and bleeding.  Bleeding into the chest.  Stroke or blood clots.  Damage to nearby areas of your body.  Allergies to medicines or dyes.  The need for a pacemaker if the normal system is damaged.  Failure of the procedure to treat the problem. What happens before the procedure? Medicines Ask your doctor about:  Changing or stopping your normal medicines. This is important.  Taking aspirin and ibuprofen. Do not take these medicines unless your doctor tells you to take them.  Taking other medicines, vitamins, herbs, and supplements. General instructions  Follow instructions from your doctor about what you cannot eat or drink.  Plan to have someone take you home from the hospital or clinic.  If you will be going home right after the procedure, plan to have someone with you for 24 hours.  Ask your doctor what steps will be taken to prevent infection. What happens during the procedure?  An IV tube will be put into one of your veins.  You will be given a medicine  to help you relax.  The skin on your neck or groin will be numbed.  A cut (incision) will be made in your neck or groin. A needle will be put through your cut and into a large vein.  A tube (catheter) will be put into the needle. The tube will be moved to your heart.  Dye may be put through the tube. This helps your doctor see your heart.  Small  devices (electrodes) on the tube will send out signals.  A type of energy will be used to destroy some heart tissue.  The tube will be taken out.  Pressure will be held on your cut. This helps stop bleeding.  A bandage will be put over your cut. The exact procedure may vary among doctors and hospitals.   What happens after the procedure?  You will be watched until you leave the hospital or clinic. This includes checking your heart rate, breathing rate, oxygen, and blood pressure.  Your cut will be watched for bleeding. You will need to lie still for a few hours.  Do not drive for 24 hours or as long as your doctor tells you. Summary  Cardiac ablation is a procedure to destroy some heart tissue. This is done to treat heart rhythm problems.  Tell your doctor about any medical conditions you may have. Tell him or her about all medicines you are taking to treat them.  This is a safe procedure. But problems may occur. These include infection, bruising, bleeding, and damage to nearby areas of your body.  Follow what your doctor tells you about food and drink. You may also be told to change or stop some of your medicines.  After the procedure, do not drive for 24 hours or as long as your doctor tells you. This information is not intended to replace advice given to you by your health care provider. Make sure you discuss any questions you have with your health care provider. Document Revised: 04/03/2019 Document Reviewed: 04/03/2019 Elsevier Patient Education  2021 [employer].

## 2020-09-15 NOTE — Progress Notes (Signed)
PCP: [name], MD Primary Cardiologist: Dr Aundra Dubin Primary EP: Dr Renaldo Fiddler is a 80 y.o. male who presents today for routine electrophysiology followup.   I last saw him 10/04/18 (my note reviewed).  He was doing well with amiodarone therapy.  He has persistent afib with tachycardia mediated CM.  His EF improved with sinus.  We discussed ablation at that time and he declined. He was not adherent to follow-up. He returned 08/10/20 to the hospital with SOB.  He was found to have afib with RVR.  He was restarted on amiodarone.  Amiodarone was discontinued due to concerns for amiodarone toxicity.  He was also treated with steroids for his acute hypoxic respiratory failure. Medical therapy has also been complicated by renal failure. He is making slow improvement.   Past Medical History:  Diagnosis Date  . Allergy    seasonal  . Asthma    mild, intermittent  . Atrial fibrillation with rapid ventricular response ([place]) 03/25/2018  . CAD, multiple vessel    Three-vessel disease involving mid RCA 85% (PCI with DES), OM 2 80% in branch and ostD1 ~90%. Only the mid RCA with PCI target.   . Carotid bruit 08/05/2010   Bruit noted on left   . Chronic combined systolic and diastolic CHF (congestive heart failure) ([place])   . CKD (chronic kidney disease), stage III ([place])   . Diabetes mellitus    type 2  . Fracture of multiple ribs 02/12/2015  . Hemothorax on right 01/23/2015  . Hyperlipidemia   . Hypertension   . Hypocalcemia 02/12/2015  . Lactic acidosis 03/25/2018  . Liver function study, abnormal 04/26/2011  . Multiple rib fractures   . Obesity   . Persistent atrial fibrillation ([place])   . Pneumonia    Past Surgical History:  Procedure Laterality Date  . APPENDECTOMY  1975  . CARDIAC CATHETERIZATION N/A 02/17/2016   Procedure: Left Heart Cath and Coronary Angiography;  Surgeon: Leonie Man, MD;  Location: [place] [facility];  Service: Cardiovascular: mRCA 85% (PCI),  branch of OM2 80%,  ostD1 90%; EF 35-45%  . CARDIAC CATHETERIZATION N/A 02/17/2016   Procedure: Coronary Stent Intervention;  Surgeon: Leonie Man, MD;  Location: [place] [facility];  Service: Cardiovascular: mRCA PCI : STENT PROMUS PREM MR 3.5X28 DES  . CARDIOVERSION N/A 03/29/2018   Procedure: CARDIOVERSION;  Surgeon: Larey Dresser, MD;  Location: [facility] ENDOSCOPY;  Service: Cardiovascular;  Laterality: N/A;  . CARDIOVERSION N/A 04/02/2018   Procedure: CARDIOVERSION;  Surgeon: Larey Dresser, MD;  Location: [facility] ENDOSCOPY;  Service: Cardiovascular;  Laterality: N/A;  . CARDIOVERSION N/A 04/19/2018   Procedure: CARDIOVERSION;  Surgeon: Larey Dresser, MD;  Location: [facility] ENDOSCOPY;  Service: Cardiovascular;  Laterality: N/A;  . CARDIOVERSION N/A 08/11/2020   Procedure: CARDIOVERSION;  Surgeon: Larey Dresser, MD;  Location[facility] ENDOSCOPY;  Service: Cardiovascular;  Laterality: N/A;  . CATARACT EXTRACTION  2010   b/l  . CORONARY STENT INTERVENTION N/A 04/03/2018   Procedure: CORONARY STENT INTERVENTION;  Surgeon: Troy Sine, MD;  Location: [place] [facility];  Service: Cardiovascular;  Laterality: N/A;  . CORONARY STENT INTERVENTION N/A 03/02/2020   Procedure: CORONARY STENT INTERVENTION;  Surgeon: Jettie Booze, MD;  Location: [place] [facility];  Service: Cardiovascular;  Laterality: N/A;  . INTRAVASCULAR ULTRASOUND/IVUS N/A 03/02/2020   Procedure: Intravascular Ultrasound/IVUS;  Surgeon: Jettie Booze, MD;  Location: [place] [facility];  Service: Cardiovascular;  Laterality: N/A;  .  RETINAL LASER PROCEDURE  1985  . RIGHT/LEFT HEART CATH AND CORONARY ANGIOGRAPHY N/A 04/03/2018   Procedure: RIGHT/LEFT HEART CATH AND CORONARY ANGIOGRAPHY;  Surgeon: Larey Dresser, MD;  Location: [place] [facility];  Service: Cardiovascular;  Laterality: N/A;  . RIGHT/LEFT HEART CATH AND CORONARY ANGIOGRAPHY N/A 03/02/2020   Procedure: RIGHT/LEFT HEART CATH AND CORONARY ANGIOGRAPHY;   Surgeon: Larey Dresser, MD;  Location: [place] [facility];  Service: Cardiovascular;  Laterality: N/A;  . TEE WITHOUT CARDIOVERSION N/A 03/29/2018   Procedure: TRANSESOPHAGEAL ECHOCARDIOGRAM (TEE);  Surgeon: Larey Dresser, MD;  Location[facility] ENDOSCOPY;  Service: Cardiovascular;  Laterality: N/A;  . TEE WITHOUT CARDIOVERSION N/A 04/19/2018   Procedure: TRANSESOPHAGEAL ECHOCARDIOGRAM (TEE);  Surgeon: Larey Dresser, MD;  Location: [facility] ENDOSCOPY;  Service: Cardiovascular;  Laterality: N/A;  . TEE WITHOUT CARDIOVERSION N/A 03/02/2020   Procedure: TRANSESOPHAGEAL ECHOCARDIOGRAM (TEE);  Surgeon: Larey Dresser, MD;  Location: [facility] ENDOSCOPY;  Service: Cardiovascular;  Laterality: N/A;  . TONSILLECTOMY AND ADENOIDECTOMY  1947  . TRANSTHORACIC ECHOCARDIOGRAM  02/2016   Mild LVH. Moderately reduced EF of 35-40% with diffuse hypokinesis. Indeterminate diastolic function. Likely elevated LA pressures. Mild aortic stenosis (mean gradient 10 mmHg). Mild MR. Mild LA dilation. Mild to moderate PA hypertension with 39 mmHg  . VIDEO ASSISTED THORACOSCOPY (VATS)/THOROCOTOMY Right 01/24/2015   Procedure: VIDEO ASSISTED THORACOSCOPY (VATS) FOR DRAINAGE OF RIGHT HEMOTHORAX;  Surgeon: Gaye Pollack, MD;  Location: [facility] OR;  Service: Thoracic;  Laterality: Right;    ROS- all systems are reviewed and negatives except as per HPI above  Current Outpatient Medications  Medication Sig Dispense Refill  . acetaminophen (TYLENOL) 500 MG tablet Take 1,000 mg by mouth every 8 (eight) hours as needed (for pain or headaches).    . ADVAIR DISKUS 100-50 MCG/DOSE AEPB INHALE 1 PUFF INTO THE LUNGS TWICE DAILY 60 each 0  . carvedilol (COREG) 25 MG tablet Take 1 tablet (25 mg total) by mouth 2 (two) times daily with a meal. 180 tablet 3  . cholecalciferol (VITAMIN D) 1000 units tablet Take 1 tablet (1,000 Units total) by mouth daily. 30 tablet 0  . clopidogrel (PLAVIX) 75 MG tablet Take 1 tablet (75 mg total) by mouth daily. 90  tablet 3  . ELIQUIS 5 MG TABS tablet TAKE 1 TABLET(5 MG) BY MOUTH TWICE DAILY 60 tablet 11  . empagliflozin (JARDIANCE) 10 MG TABS tablet Take 1 tablet (10 mg total) by mouth daily before breakfast. 30 tablet 11  . ENTRESTO 49-51 MG TAKE 1 TABLET BY MOUTH TWICE DAILY 60 tablet 11  . ezetimibe (ZETIA) 10 MG tablet Take 1 tablet (10 mg total) by mouth daily. 90 tablet 3  . glipiZIDE (GLUCOTROL) 5 MG tablet Take 1 tablet (5 mg total) by mouth daily before breakfast. 90 tablet 3  . glucose blood test strip Use as instructed 100 each 12  . icosapent Ethyl (VASCEPA) 1 g capsule Take 2 capsules (2 g total) by mouth 2 (two) times daily. 120 capsule 11  . insulin glargine (LANTUS SOLOSTAR) 100 UNIT/ML Solostar Pen Inject 10 Units into the skin at bedtime. 15 mL 0  . insulin lispro (HUMALOG) 100 UNIT/ML injection Check BS tid with meals and qhs 0-200 no units, 201-250 2 units, 251-300 4 units, 301-350 6 units, 351-400 8 units, 401-450 10 units, call MD >450 10 mL 0  . Insulin Pen Needle (PEN NEEDLES) 32G X 4 MM MISC Use 1 at bedtime with Lantus.  Dx code: E11.9 100 each  0  . metFORMIN (GLUCOPHAGE) 1000 MG tablet Take 1 tablet (1,000 mg total) by mouth daily with breakfast. 90 tablet 3  . nitroGLYCERIN (NITROSTAT) 0.4 MG SL tablet Place 1 tablet (0.4 mg total) under the tongue every 5 (five) minutes as needed for chest pain. 30 tablet 0  . polyvinyl alcohol (LIQUIFILM TEARS) 1.4 % ophthalmic solution Place 1 drop into both eyes daily as needed for dry eyes (irritation).    . predniSONE (DELTASONE) 10 MG tablet TAKE 5 TABLETS X 21 DAYS, 4 TABLETS X 21 DAYS, 3 TABLETS X 21 DAYS, 2 TABLETS X 21 DAYS, 1 TABLET X 21 DAYS (then pick up $Remov'5mg'vPkmbq$  tabs) 315 tablet 0  . predniSONE 5 MG TBEC TAKE 1 TABLET X 10 DAYS, 1/2 TABLET X 10 DAYS, 1/2 TABLET EVERY OTHER DAY FOR 10 DAYS (THEN STOP) 20 tablet 0  . ranolazine (RANEXA) 500 MG 12 hr tablet Take 1 tablet (500 mg total) by mouth 2 (two) times daily. 180 tablet 0  . sodium  polystyrene (KAYEXALATE) 15 GM/60ML suspension Take 60 mLs (15 g total) by mouth daily as needed. 240 mL 0  . torsemide (DEMADEX) 20 MG tablet Take 1 tablet (20 mg total) by mouth in the morning. Start taking on 08/22/20 30 tablet 0  . atorvastatin (LIPITOR) 80 MG tablet Take 1 tablet (80 mg total) by mouth daily. 30 tablet 6   Current Facility-Administered Medications  Medication Dose Route Frequency Provider Last Rate Last Admin  . insulin starter kit- pen needles (English) 1 kit  1 kit Other Once [name], MD        Physical Exam: Vitals:   09/15/20 1539  BP: 126/70  Pulse: (!) 101  SpO2: 96%  Weight: 263 lb (119.3 kg)  Height: 6' (1.829 m)    GEN- The patient is chronically ill appearing, alert and oriented x 3 today.   Head- normocephalic, atraumatic Eyes-  Sclera clear, conjunctiva pink Ears- hearing intact Oropharynx- clear Lungs-   normal work of breathing Heart- irregular rate and rhythm  GI- soft  Extremities- + edema  Wt Readings from Last 3 Encounters:  09/15/20 263 lb (119.3 kg)  09/03/20 263 lb 9.6 oz (119.6 kg)  08/31/20 262 lb 3.2 oz (118.9 kg)    EKG tracing ordered today is personally reviewed and shows afib ekg 08/09/20 shows typical appearing atrial flutter,  ekg 08/10/20 shows likely 1:1 atrial flutter  Echo 08/11/20- EF 50-55%, moderate biatrial enlargement  Assessment and Plan:  1. Persistent atrial fibrillation/ atrial flutter The patient has symptomatic, recurrent  atrial fibrillation and atrial flutter. he has failed medical therapy with amiodarone. Chads2vasc score is 6.  he is anticoagulated with eliquis. Therapeutic strategies for afib/ atrial flutter including medicine (tikosyn) and ablation were discussed in detail with the patient today. Our medicine options are limited.  I am not sure that he could do tikosyn unless his creatinine improves.  He has had possible lung reaction to amiodarone.   Risk, benefits, and alternatives to EP  study and radiofrequency ablation for afib were also discussed in detail today. These risks include but are not limited to stroke, bleeding, vascular damage, tamponade, perforation, damage to the esophagus, lungs, and other structures, pulmonary vein stenosis, worsening renal function, and death. The patient understands these risk and wishes to proceed.  We will therefore proceed with catheter ablation at the next available time.  Carto, ICE, anesthesia are requested for the procedure.  Will also obtain TEE prior to the procedure  to exclude LAA thrombus and further evaluate atrial anatomy.  Risks, benefits and potential toxicities for medications prescribed and/or refilled reviewed with patient today.   2. Chronic systolic dysfunction  tachycardia mediated CM It is important that he remain in sinus if able.  3. Acute on chronic renal failure Will need to follow closely with PCP and CHF teams  4. CAD Cath note from 03/02/20 reviewed Stop plavix as directed by Dr Irish Lack  5. Respiratory failure Slowly improving Follows with pulmonary  Thompson Grayer MD, [facility] 09/15/2020 3:47 PM

## 2020-09-17 ENCOUNTER — Telehealth (HOSPITAL_COMMUNITY): Payer: Self-pay | Admitting: Cardiology

## 2020-09-17 DIAGNOSIS — I5022 Chronic systolic (congestive) heart failure: Secondary | ICD-10-CM

## 2020-09-17 NOTE — Telephone Encounter (Signed)
-----   Message from Sherald Hess, NP sent at 09/02/2020  1:06 PM EDT ----- BNP coming down. Renal function stable. No change. Hgb stable. Need to repeat CBC next week. WBC elevated. Please call.

## 2020-09-21 ENCOUNTER — Telehealth: Payer: Self-pay

## 2020-09-21 ENCOUNTER — Ambulatory Visit (HOSPITAL_COMMUNITY): Admission: RE | Admit: 2020-09-21 | Payer: Medicare Other | Source: Ambulatory Visit

## 2020-09-21 NOTE — Telephone Encounter (Signed)
FYI

## 2020-09-21 NOTE — Telephone Encounter (Signed)
Received call from Rabovan, OT from [facility]- he went out this morning for OT eval and was informed by Pt's wife [name] that they are only interested in PT at this time.   For questions- he can be reached at [phone].

## 2020-09-24 ENCOUNTER — Other Ambulatory Visit: Payer: Self-pay

## 2020-09-24 ENCOUNTER — Ambulatory Visit (HOSPITAL_COMMUNITY)
Admission: RE | Admit: 2020-09-24 | Discharge: 2020-09-24 | Disposition: A | Discharge status: Home / Self Care | Payer: Medicare Other | Source: Ambulatory Visit | Attending: Cardiology | Admitting: Cardiology

## 2020-09-24 DIAGNOSIS — I5022 Chronic systolic (congestive) heart failure: Secondary | ICD-10-CM | POA: Insufficient documentation

## 2020-09-24 LAB — CBC
HCT: 39.6 % (ref 39.0–52.0)
Hemoglobin: 12.8 g/dL — ABNORMAL LOW (ref 13.0–17.0)
MCH: 31.1 pg (ref 26.0–34.0)
MCHC: 32.3 g/dL (ref 30.0–36.0)
MCV: 96.4 fL (ref 80.0–100.0)
Platelets: 160 10*3/uL (ref 150–400)
RBC: 4.11 MIL/uL — ABNORMAL LOW (ref 4.22–5.81)
RDW: 15.5 % (ref 11.5–15.5)
WBC: 12.4 10*3/uL — ABNORMAL HIGH (ref 4.0–10.5)
nRBC: 0 % (ref 0.0–0.2)

## 2020-09-24 NOTE — Addendum Note (Signed)
Addended by: Noralee Space on: 09/24/2020 01:51 PM   Modules accepted: Orders

## 2020-09-28 ENCOUNTER — Ambulatory Visit: Payer: Medicare Other | Admitting: Internal Medicine

## 2020-09-28 ENCOUNTER — Other Ambulatory Visit: Payer: Self-pay

## 2020-09-28 ENCOUNTER — Other Ambulatory Visit (HOSPITAL_COMMUNITY): Payer: Self-pay

## 2020-09-28 ENCOUNTER — Encounter (HOSPITAL_COMMUNITY): Payer: Self-pay | Admitting: Cardiology

## 2020-09-28 ENCOUNTER — Ambulatory Visit (HOSPITAL_COMMUNITY)
Admission: RE | Admit: 2020-09-28 | Discharge: 2020-09-28 | Disposition: A | Discharge status: Home / Self Care | Payer: Medicare Other | Source: Ambulatory Visit | Attending: Cardiology | Admitting: Cardiology

## 2020-09-28 VITALS — BP 118/70 | HR 88 | Wt 268.2 lb

## 2020-09-28 DIAGNOSIS — Z794 Long term (current) use of insulin: Secondary | ICD-10-CM | POA: Diagnosis not present

## 2020-09-28 DIAGNOSIS — I255 Ischemic cardiomyopathy: Secondary | ICD-10-CM | POA: Diagnosis not present

## 2020-09-28 DIAGNOSIS — Z79899 Other long term (current) drug therapy: Secondary | ICD-10-CM | POA: Insufficient documentation

## 2020-09-28 DIAGNOSIS — I4819 Other persistent atrial fibrillation: Secondary | ICD-10-CM | POA: Diagnosis not present

## 2020-09-28 DIAGNOSIS — N183 Chronic kidney disease, stage 3 unspecified: Secondary | ICD-10-CM | POA: Insufficient documentation

## 2020-09-28 DIAGNOSIS — I480 Paroxysmal atrial fibrillation: Secondary | ICD-10-CM | POA: Insufficient documentation

## 2020-09-28 DIAGNOSIS — I5042 Chronic combined systolic (congestive) and diastolic (congestive) heart failure: Secondary | ICD-10-CM | POA: Diagnosis not present

## 2020-09-28 DIAGNOSIS — R0602 Shortness of breath: Secondary | ICD-10-CM | POA: Diagnosis not present

## 2020-09-28 DIAGNOSIS — I5022 Chronic systolic (congestive) heart failure: Secondary | ICD-10-CM | POA: Diagnosis not present

## 2020-09-28 DIAGNOSIS — Z7984 Long term (current) use of oral hypoglycemic drugs: Secondary | ICD-10-CM | POA: Insufficient documentation

## 2020-09-28 DIAGNOSIS — E1122 Type 2 diabetes mellitus with diabetic chronic kidney disease: Secondary | ICD-10-CM | POA: Insufficient documentation

## 2020-09-28 DIAGNOSIS — I080 Rheumatic disorders of both mitral and aortic valves: Secondary | ICD-10-CM | POA: Diagnosis not present

## 2020-09-28 DIAGNOSIS — Z7951 Long term (current) use of inhaled steroids: Secondary | ICD-10-CM | POA: Diagnosis not present

## 2020-09-28 DIAGNOSIS — E785 Hyperlipidemia, unspecified: Secondary | ICD-10-CM | POA: Insufficient documentation

## 2020-09-28 DIAGNOSIS — I130 Hypertensive heart and chronic kidney disease with heart failure and stage 1 through stage 4 chronic kidney disease, or unspecified chronic kidney disease: Secondary | ICD-10-CM | POA: Diagnosis not present

## 2020-09-28 DIAGNOSIS — Z955 Presence of coronary angioplasty implant and graft: Secondary | ICD-10-CM | POA: Diagnosis not present

## 2020-09-28 DIAGNOSIS — I2510 Atherosclerotic heart disease of native coronary artery without angina pectoris: Secondary | ICD-10-CM | POA: Insufficient documentation

## 2020-09-28 DIAGNOSIS — Z7901 Long term (current) use of anticoagulants: Secondary | ICD-10-CM | POA: Diagnosis not present

## 2020-09-28 LAB — BRAIN NATRIURETIC PEPTIDE: B Natriuretic Peptide: 360.9 pg/mL — ABNORMAL HIGH (ref 0.0–100.0)

## 2020-09-28 LAB — BASIC METABOLIC PANEL
Anion gap: 10 (ref 5–15)
BUN: 43 mg/dL — ABNORMAL HIGH (ref 8–23)
CO2: 25 mmol/L (ref 22–32)
Calcium: 8.9 mg/dL (ref 8.9–10.3)
Chloride: 100 mmol/L (ref 98–111)
Creatinine, Ser: 1.52 mg/dL — ABNORMAL HIGH (ref 0.61–1.24)
GFR, Estimated: 46 mL/min — ABNORMAL LOW (ref >60)
Glucose, Bld: 121 mg/dL — ABNORMAL HIGH (ref 70–99)
Potassium: 4.2 mmol/L (ref 3.5–5.1)
Sodium: 135 mmol/L (ref 135–145)

## 2020-09-28 MED ORDER — SACUBITRIL-VALSARTAN 24-26 MG PO TABS: 1.0000 | ORAL_TABLET | Freq: Two times a day (BID) | ORAL | 5 refills | Status: DC

## 2020-09-28 NOTE — Progress Notes (Signed)
ReDS Vest / Clip - 09/28/20 1200      ReDS Vest / Clip   Station Marker D    Ruler Value 32    ReDS Value Range Low volume    ReDS Actual Value 32

## 2020-09-28 NOTE — Patient Instructions (Signed)
Use Compression Stockings during the day  DECREASE Entresto to 24/26mg  (1 tab) twice a day  Labs today We will only contact you if something comes back abnormal or we need to make some changes. Otherwise no news is good news!  Your physician recommends that you schedule a follow-up appointment in: 3 weeks with the Nurse Practitioner or Physician Assistant  Please call office at [phone] option 2 if you have any questions or concerns.   At the [facility], you and your health needs are our priority. As part of our continuing mission to provide you with exceptional heart care, we have created designated Provider Care Teams. These Care Teams include your primary Cardiologist (physician) and Advanced Practice Providers (APPs- Physician Assistants and Nurse Practitioners) who all work together to provide you with the care you need, when you need it.   You may see any of the following providers on your designated Care Team at your next follow up: Marland Kitchen Dr Arvilla Meres . Dr [name] . Dr Thornell Mule . Tonye Becket, NP . Robbie Lis, PA . Shanda Bumps [place],NP . Karle Plumber, PharmD   Please be sure to bring in all your medications bottles to every appointment.

## 2020-09-29 LAB — GLUCOSE 6 PHOSPHATE DEHYDROGENASE
G6PDH: 10.1 U/g{Hb} (ref 4.8–15.7)
Hemoglobin: 13 g/dL (ref 13.0–17.7)

## 2020-09-29 NOTE — Progress Notes (Signed)
Advanced Heart Failure Clinic Note   PCP: Bradd Canary, MD  Cardiologist: [name], MD   David Huynh is a 80 y.o. male with a history of CAD, chronic systolic CHF, HTN, HLD, DM, CKD 3, and asthma. Patient had EF 35-40% in 9/17, had cath with DES to RCA then repeat echo with EF up to 55-60% by 3/18.   Admitted 03/2018 with SOB x 3 weeks. No cardiology follow up in >1 year. Found to be in atrial fibrillation with RVR. EF down to 35-40%. Poor diuresis with IV lasix, so HF team consulted. He was started on amiodarone drip. PICC line placed with CVP markedly elevated and Coox marginal. Held off on inotropes with concern for making afib worse. Started on lasix drip. He started eliquis and had TEE/DCCV 03/29/18. Was converted to NSR, but shortly after went back into AF. Ranexa added. Attempted repeat DCCV 04/02/18, but was unsuccessful. EP consulted and recommended repeat LHC prior to repeat DCCV. Had [facility] 04/03/18 with PCI to proximal LCx.  He then had TEE-DCCV on 04/19/18 back to NSR. EF on TEE was 40%.  He saw Dr. Johney Frame to discuss ablation and opted to continue amiodarone.   Echo in 3/20 showed EF 50% with diffuse hypokinesis, normal RV, mild AS and mild MR.    He was lost to followup in this office again for over a year.  In 7/21, he was admitted for a couple of days with CHF exacerbation and was treated with IV Lasix.  Echo was done in 7/21, EF back down to 25-30%, global hypokinesis, severely decreased RV systolic function with severe RV enlargement, at least moderate aortic stenosis.  He was in NSR.    TEE was done in 10/21 for closer evaluation of aortic valve, this showed EF actually up to 50% with moderate AS and moderate MR.  LHC/RHC was done, showing normal filling pressures and significant LAD disease.  The patient is now s/p DES to LAD.   He was admitted on [facility] on 08/10/20 with atrial fibrillation/RVR and CHF.  Amiodarone was started and he was cardioverted to NSR.  Echo in  3/22 showed EF 50-55% with normal RV and mild AS, mild MR.  He developed acute amiodarone lung toxicity with elevated ESR and patchy bilateral infiltrates.  Amiodarone was stopped and prednisone was started.  Oxygen saturation gradually increased over time and he was eventually able to wean off oxygen.   He returns today for followup of CHF and CAD.  He is back in atrial fibrillation with controlled rate. His legs are swelling. He is now on room air (not using oxygen).  Says breathing has considerably improved. He is still on prednisone 40 mg daily and Bactrim prophylaxis.  No dyspnea walking around his house, gets short of breath walking about 100 feet.  No orthopnea/PND.  No chest pain.  No lightheadedness.  He is now off Plavix. He has seen Dr. Johney Frame and is planned for AF ablation.   ECG (personally reviewed): atrial fibrillation, septal Qs, QTc 399 msec  REDS clip 32%  Labs (12/19): K 4.2, creatinine 1.35, LFTs normal Las (7/21): LDL 198, K 3.7, creatinine 1.75 Labs (8/21): K 4, creatinine 1.48, BNP 593 Labs (10/21): K 3.9, creatinine 1.28, LDL 230 (not taking statin), TSH normal, LFTs normal Labs (11/21): K 3.8, creatinine 1.34 Labs (4/22): K 6, creatinine 2.41 Labs (5/22): hgb 12.8, K 4, creatinine 1.52  SH: No tobacco or drug use. Rare ETOH use. Lives at home  with his wife in [place]. 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  FH: No family hx of CAD or CHF.   Review of systems complete and found to be negative unless listed in HPI.   PMH: 1. CKD: Stage 3.  2. Atrial fibrillation: Paroxysmal => persistent, poorly tolerated with history of tachycardia-mediated CMP.   - DCCV x 2 in 11/19 and once in 12/19.  - DCCV to NSR in 3/22 on amiodarone, developed amiodarone lung toxicity and amiodarone stopped.  Went back into atrial fibrillation.  3. Type II diabetes.  4. HTN 5. Hyperlipidemia.  6. CAD: DES to mid RCA in 2017.   - LHC (11/19): 50% mLAD, 50% dLAD, 80-85% proximal LCx stenosis, OM1 small  vessel with 90% stenois. PCI to proximal LCx.  - LHC (10/21): 80% pLAD, 70% p/mLAD, 50% m/d LAD, 80% superior branch OM2, 60% inferior branch OM2, 50% mPDA. PCI with DES to LAD.  7. Chronic systolic CHF: Suspect this was primarily tachy-cardia mediated.  - Echo (11/19): EF 35-40%, moderate to severely decreased RV systolic function.  - TEE (12/19): EF 40%, diffuse hypokinesis, normal RV size and systolic function.  - Echo (3/20): EF 50%, normal RV, mild MR, mild AS with mean gradient 14 mmHg. - Echo (7/21): EF 25-30%, global hypokinesis, severely decreased RV systolic function with severe RV enlargement, at least moderate aortic stenosis.   - TEE (10/21): EF 50%, anterior hypokinesis, basal inferior hypokinesis, normal RV, moderate AS with mean gradient 22 mmHg and AVA 1.18 cm^2, moderate MR with ERO 0.32 cm^2.  - Echo (3/22): EF 50-55% with normal RV and mild AS, mild MR - RHC (10/21): mean RA 2, PA 29/4, mean PCWP 6, CI 2.66, easily crossed AoV with mean gradient 15 mmHg.  8. Aortic stenosis: Mild by 3/20 echo. 7/21 echo with at least moderate AS.  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    Current Outpatient Medications  Medication Sig Dispense Refill  . acetaminophen (TYLENOL) 500 MG tablet Take 1,000 mg by mouth every 8 (eight) hours as needed (for pain or headaches).    . ADVAIR DISKUS 100-50 MCG/DOSE AEPB INHALE 1 PUFF INTO THE LUNGS TWICE DAILY 60 each 0  . atorvastatin (LIPITOR) 80 MG tablet Take 1 tablet (80 mg total) by mouth daily. 30 tablet 6  . carvedilol (COREG) 25 MG tablet Take 1 tablet (25 mg total) by mouth 2 (two) times daily with a meal. 180 tablet 3  . cholecalciferol (VITAMIN D) 1000 units tablet Take 1 tablet (1,000 Units total) by mouth daily. 30 tablet 0  . ELIQUIS 5 MG TABS tablet TAKE 1 TABLET(5 MG) BY MOUTH TWICE DAILY 60 tablet 11  . empagliflozin (JARDIANCE) 10 MG TABS tablet Take 1 tablet (10 mg total) by mouth daily before breakfast. 30  tablet 11  . ezetimibe (ZETIA) 10 MG tablet Take 1 tablet (10 mg total) by mouth daily. 90 tablet 3  . glipiZIDE (GLUCOTROL) 5 MG tablet Take 1 tablet (5 mg total) by mouth daily before breakfast. 90 tablet 3  . glucose blood test strip Use as instructed 100 each 12  . icosapent Ethyl (VASCEPA) 1 g capsule Take 2 capsules (2 g total) by mouth 2 (two) times daily. 120 capsule 11  . insulin glargine (LANTUS SOLOSTAR) 100 UNIT/ML Solostar Pen Inject 10 Units into the skin at bedtime. 15 mL 0  . insulin lispro (HUMALOG) 100 UNIT/ML injection Check BS tid with meals and qhs 0-200 no units, 201-250 2 units, 251-300  4 units, 301-350 6 units, 351-400 8 units, 401-450 10 units, call MD >450 10 mL 0  . Insulin Pen Needle (PEN NEEDLES) 32G X 4 MM MISC Use 1 at bedtime with Lantus.  Dx code: E11.9 100 each 0  . metFORMIN (GLUCOPHAGE) 1000 MG tablet Take 1 tablet (1,000 mg total) by mouth daily with breakfast. 90 tablet 3  . nitroGLYCERIN (NITROSTAT) 0.4 MG SL tablet Place 1 tablet (0.4 mg total) under the tongue every 5 (five) minutes as needed for chest pain. 30 tablet 0  . polyvinyl alcohol (LIQUIFILM TEARS) 1.4 % ophthalmic solution Place 1 drop into both eyes daily as needed for dry eyes (irritation).    . predniSONE (DELTASONE) 10 MG tablet TAKE 5 TABLETS X 21 DAYS, 4 TABLETS X 21 DAYS, 3 TABLETS X 21 DAYS, 2 TABLETS X 21 DAYS, 1 TABLET X 21 DAYS (then pick up $Remov'5mg'efxzXq$  tabs) 315 tablet 0  . predniSONE 5 MG TBEC TAKE 1 TABLET X 10 DAYS, 1/2 TABLET X 10 DAYS, 1/2 TABLET EVERY OTHER DAY FOR 10 DAYS (THEN STOP) 20 tablet 0  . ranolazine (RANEXA) 500 MG 12 hr tablet Take 1 tablet (500 mg total) by mouth 2 (two) times daily. 180 tablet 0  . sacubitril-valsartan (ENTRESTO) 24-26 MG Take 1 tablet by mouth 2 (two) times daily. 60 tablet 5  . sodium polystyrene (KAYEXALATE) 15 GM/60ML suspension Take 60 mLs (15 g total) by mouth daily as needed. 240 mL 0  . Sulfamethoxazole-Trimethoprim (BACTRIM PO) Take 1 tablet by mouth  3 (three) times a week.    . torsemide (DEMADEX) 20 MG tablet Take 1 tablet (20 mg total) by mouth in the morning. Start taking on 08/22/20 30 tablet 0   Current Facility-Administered Medications  Medication Dose Route Frequency Provider Last Rate Last Admin  . insulin starter kit- pen needles (English) 1 kit  1 kit Other Once [name], MD        No Known Allergies   Vitals:   09/28/20 1151  BP: 118/70  Pulse: 88  SpO2: 100%  Weight: 121.7 kg (268 lb 3.2 oz)   Wt Readings from Last 3 Encounters:  09/28/20 121.7 kg (268 lb 3.2 oz)  09/15/20 119.3 kg (263 lb)  09/03/20 119.6 kg (263 lb 9.6 oz)   PHYSICAL EXAM: General: NAD Neck: No JVD, no thyromegaly or thyroid nodule.  Lungs: Clear to auscultation bilaterally with normal respiratory effort. CV: Nondisplaced PMI.  Heart irregular S1/S2, no S3/S4, 2/6 SEM RUSB.  1+ edema to knees.  No carotid bruit.  Normal pedal pulses.  Abdomen: Soft, nontender, no hepatosplenomegaly, no distention.  Skin: Intact without lesions or rashes.  Neurologic: Alert and oriented x 3.  Psych: Normal affect. Extremities: No clubbing or cyanosis.  HEENT: Normal.   ASSESSMENT & PLAN:  1. Chronic systolic=>diastolic CHF: Prior ischemic cardiomyopathy, improved after PCI in 2017. Echo 03/2018 with EF back down to 35-40% and moderate-severe RV dysfunction. This was in the setting of atrial fibrillation with RVR. EF 40% on TEE 04/2018.  Echo was in 3/20, showing EF up to 50%.  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  Echo in 7/21 with EF 25-30%, severe RV dysfunction.  TEE in 10/21, however, showed EF up to 50%.  He had PCI to LAD in 10/21. Echo in 3/22 showed stable EF 50-55% with normal RV.   NYHA class II-III symptoms.  He has peripheral edema but no JVD, and REDS clip is  only 32%.  I will not increase his diuretic.  Creatinine today 1.5.  - Continue torsemide 20 mg daily.     - With improved EF and elevated  creatinine, I will decrease Entresto to 24/26 bid.   -Continue Coreg 25 mg bid.  - Compression stockings for peripheral edema.  2. Atrial fibrillation: As above, concerned for possible tachy-mediated CMP. Had successful DC-CV on 03/29/18 but then back in atrial fibrillation.Ranolazine added, but failed DCCV again 04/02/18. DCCV 04/19/18 was successful.  AF with RVR at 3/22 admission, amiodarone restarted and he was cardioverted to NSR, but he developed amiodarone lung toxicity.  Amiodarone was stopped.  He has subsequently gone back into rate-controlled atrial fibrillation. . - Currently on ranolazine 500 mg BID, QTc acceptable on today's ECG.  - No amiodarone with history of lung toxicity.   - Continue Eliquis 5 mg BID.   - Plan for atrial fibrillation ablation with Dr. Rayann Heman soon.  3. CKD: Stage 3.  Creatinine 1.5 today, improved.  4. CAD: History of DES to RCA in 9/17. Underwent LHC 04/03/18 with PCI to prox left circ. LHC in 10/21 with PCI to LAD.   - He is on Eliquis so no ASA.   - He is now off Plavix after 6 months (stopped prior to AF ablation).  - Continue statin.  5. DM2: Will need to get him on SGLT2 inhibitor in the future.  6. Aortic stenosis: Mild on 3/22 echo.  7. Mitral regurgitation: Mild on 3/22 echo.  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 He is off amiodarone.  He improved with steroids.   - Continue steroid taper per pulmonary, he needs followup with pulmonary.  - Bactrim for PJP prophylaxis, follow K and creatinine closely.   Loralie Champagne, MD 09/29/20

## 2020-10-01 ENCOUNTER — Telehealth: Payer: Self-pay | Admitting: *Deleted

## 2020-10-01 NOTE — Telephone Encounter (Signed)
Receive prior auth from [facility] for generic Humalog.  While trying to do priot auth they gave list of alternatives and what requires and does not require PA:  Insulin Lispro 100UNIT/ML solution Not Specified Humalog KwikPen Insulin 100 Unit/Ml Inpn Not Required Humalog U-100 Insulin (Soln) Not Required Admelog SoloStar U-100 Insulin Required Admelog U-100 Insulin Lispro Required Insulin Aspart U-100 (Soln) Required Novolog Flexpen U-100 Insulin Required Novolog U-100 Insulin Aspart Required  Humalog the brand name does not require prior auth.  Called pharmacy to see if we can change to Humalog and it was covered.

## 2020-10-06 ENCOUNTER — Telehealth: Payer: Self-pay | Admitting: Internal Medicine

## 2020-10-06 ENCOUNTER — Encounter: Payer: Self-pay | Admitting: Internal Medicine

## 2020-10-06 ENCOUNTER — Ambulatory Visit: Payer: Medicare Other | Admitting: Internal Medicine

## 2020-10-06 ENCOUNTER — Other Ambulatory Visit: Payer: Self-pay

## 2020-10-06 ENCOUNTER — Ambulatory Visit (INDEPENDENT_AMBULATORY_CARE_PROVIDER_SITE_OTHER): Payer: Medicare Other | Admitting: Internal Medicine

## 2020-10-06 VITALS — BP 74/56 | HR 92 | Temp 98.0°F | Ht 72.0 in | Wt 262.0 lb

## 2020-10-06 DIAGNOSIS — I255 Ischemic cardiomyopathy: Secondary | ICD-10-CM | POA: Diagnosis not present

## 2020-10-06 DIAGNOSIS — I5043 Acute on chronic combined systolic (congestive) and diastolic (congestive) heart failure: Secondary | ICD-10-CM

## 2020-10-06 DIAGNOSIS — J984 Other disorders of lung: Secondary | ICD-10-CM

## 2020-10-06 DIAGNOSIS — T462X5A Adverse effect of other antidysrhythmic drugs, initial encounter: Secondary | ICD-10-CM | POA: Diagnosis not present

## 2020-10-06 MED ORDER — PREDNISONE 10 MG PO TABS: ORAL_TABLET | ORAL | 0 refills | Status: DC

## 2020-10-06 NOTE — Progress Notes (Signed)
Hospital course 08/09/2020-08/20/2020 80 year old male, never smoked.  Significant for hypertension, cardiomyopathy, congestive heart failure, aortic stenosis, amiodarone pulmonary toxicity, mild persistent asthma, type 2 diabetes.  Recent hospitalization for acute decompensation of diastolic heart failure complicated by A. fib/flutter and acute hypoxic respiratory failure.    Patient presented to ED with dyspnea.  Upon EMS arrival he was in atrial fib/flutter 100 to 120 bpm.  Patient developed SVT/flutter 220 bpm.  He was placed on amiodarone infusion and underwent cardioversion on 3/30.  Patient with persistent hypoxia of respiratory failure, work-up with CT chest showed patchy bilateral infiltrates, predominantly at lower lobes.  Amiodarone discontinued due to medication induced lung injury and started on systemic steroids.  Continue to have increased oxygen requirements on ambulation up to 10 L.  He will need a slow prednisone taper with follow-up outpatient.  Patient was discharged on supplemental oxygen nasal cannula.  Continue bronchodilator therapy.  08/25/2020 Patient presents today for hospital follow-up hypoxic respiratory failure/amiodarone toxicity. Accompanied by his wife. His breathing has "gotten measurably better".  He is currently on 44m prednisone, plan is to taper 179mevery 21 days. BS was 137 yesterday. He has an endocrinologist and will be seeing her the end of April. He is taking Advair inhaler as needed. He is currently on 5L oxygen. For short periods of time he is able to go without his oxygen at home. He is using an rolling walker to ambulate. He will be getting nursing/physical therapy services at home. He will be seeing cardiology next week. Plan repeat CXR today to monitor infiltrates.  Denies f/c/s, chest pain or tightness, wheezing, cough.  IMPRESSION: 1. Continued improvement in patchy bibasilar infiltrates.   Electronically Signed   By: WiTitus DubinM.D.   On: 08/26/2020 10:16  OV 10/06/2020  Subjective:  Patient ID: David Huynh , DOB: 2/09-06-1940 age 89.o. , MRN: [id number] [place]: [id number] BlMosie LukesMD Patient Care Team: BlMosie LukesMD as PCP - General (Family Medicine) McLarey DresserMD as PCP - Cardiology (Cardiology) BaGaye PollackMD as Consulting Physician (Cardiothoracic Surgery)  This Provider for this visit: Treatment Team:  Attending Provider: RaBrand MalesMD    10/06/2020 -   Chief Complaint  Patient presents with  . Follow-up    Some sob with exertion, but pt states this is normal for him. Says he is feeling much better, states it feels like his lungs have completely cleared up. Has not used oxygen in several weeks   Follow-up for acute hypoxemic respiratory failure secondary to amiodarone toxicity with hospitalization in early April 2022.  Started on prednisone/steroids.  Steroid plan as follows:6083mrednisone daily from 08/17/20  ->  Wean by 57m61mery 3 weeks. If recurrence of symptoms, then go back to last successful dose and prolong taper to over one year. -- Continue bactrim PJP ppx  HPI David BOLYARDy2. -returns for follow-up.  Presents with his wife David Huynh personally saw him in early April 2022 in the hospital.  He says that after he got home he started dramatically improving.  For the last 3 to 4 weeks he is off oxygen.  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  He continues on prednisone taper as above.  He says he is currently on 4 t pills a daily.  He is not sure  what he suspects this is 10 mg tablets.  Therefore he is on 40 mg/day.  He is tolerating prednisone well.  He uses a walker.  He says his life is so dramatically better.  He feels he does not need oxygen.  He is not using oxygen at night.  He does use a walker and has somewhat some deconditioning.  He believes he got  respiratory failure from amiodarone.  He is on low-dose for 2 years.  Then 2 weeks prior to admission the increased his amiodarone oral dose and then restarted IV right prior to him getting sick.   20 mg/day for 3 weeks  Sittign on room air 96% and BP 88/64 and HR 97 and  Aifb. Did not want to walk due to feeling dizzy. Has ablation scheduled  CT Chest data    No results found.    PFT  No flowsheet data found.     has a past medical history of Allergy, Asthma, Atrial fibrillation with rapid ventricular response ([place]) (03/25/2018), CAD, multiple vessel, Carotid bruit (08/05/2010), Chronic combined systolic and diastolic CHF (congestive heart failure) ([place]), CKD (chronic kidney disease), stage III ([place]), Diabetes mellitus, Fracture of multiple ribs (02/12/2015), Hemothorax on right (01/23/2015), Hyperlipidemia, Hypertension, Hypocalcemia (02/12/2015), Lactic acidosis (03/25/2018), Liver function study, abnormal (04/26/2011), Multiple rib fractures, Obesity, Persistent atrial fibrillation ([place]), and Pneumonia.   reports that he has never smoked. He has never used smokeless tobacco.  Past Surgical History:  Procedure Laterality Date  . APPENDECTOMY  1975  . CARDIAC CATHETERIZATION N/A 02/17/2016   Procedure: Left Heart Cath and Coronary Angiography;  Surgeon: Leonie Man, MD;  Location: [place] [facility];  Service: Cardiovascular: mRCA 85% (PCI), branch of OM2 80%,  ostD1 90%; EF 35-45%  . CARDIAC CATHETERIZATION N/A 02/17/2016   Procedure: Coronary Stent Intervention;  Surgeon: Leonie Man, MD;  Location: [facility];  Service: Cardiovascular: mRCA PCI : STENT PROMUS PREM MR 3.5X28 DES  . CARDIOVERSION N/A 03/29/2018   Procedure: CARDIOVERSION;  Surgeon: Larey Dresser, MD;  Location: [facility] ENDOSCOPY;  Service: Cardiovascular;  Laterality: N/A;  . CARDIOVERSION N/A 04/02/2018   Procedure: CARDIOVERSION;  Surgeon: Larey Dresser, MD;  Location: [facility] ENDOSCOPY;  Service:  Cardiovascular;  Laterality: N/A;  . CARDIOVERSION N/A 04/19/2018   Procedure: CARDIOVERSION;  Surgeon: Larey Dresser, MD;  Location: [facility] ENDOSCOPY;  Service: Cardiovascular;  Laterality: N/A;  . CARDIOVERSION N/A 08/11/2020   Procedure: CARDIOVERSION;  Surgeon: Larey Dresser, MD;  Location: [facility] ENDOSCOPY;  Service: Cardiovascular;  Laterality: N/A;  . CATARACT EXTRACTION  2010   b/l  . CORONARY STENT INTERVENTION N/A 04/03/2018   Procedure: CORONARY STENT INTERVENTION;  Surgeon: Troy Sine, MD;  Location: [place] [facility];  Service: Cardiovascular;  Laterality: N/A;  . CORONARY STENT INTERVENTION N/A 03/02/2020   Procedure: CORONARY STENT INTERVENTION;  Surgeon: Jettie Booze, MD;  Location: [place];  Service: Cardiovascular;  Laterality: N/A;  . INTRAVASCULAR ULTRASOUND/IVUS N/A 03/02/2020   Procedure: Intravascular Ultrasound/IVUS;  Surgeon: Jettie Booze, MD;  Location: [place] [facility];  Service: Cardiovascular;  Laterality: N/A;  . RETINAL LASER PROCEDURE  1985  . RIGHT/LEFT HEART CATH AND CORONARY ANGIOGRAPHY N/A 04/03/2018   Procedure: RIGHT/LEFT HEART CATH AND CORONARY ANGIOGRAPHY;  Surgeon: Larey Dresser, MD;  Location: [place] [facility];  Service: Cardiovascular;  Laterality: N/A;  . RIGHT/LEFT HEART CATH AND CORONARY ANGIOGRAPHY N/A 03/02/2020   Procedure: RIGHT/LEFT HEART CATH AND CORONARY  ANGIOGRAPHY;  Surgeon: Larey Dresser, MD;  Location: [place];  Service: Cardiovascular;  Laterality: N/A;  . TEE WITHOUT CARDIOVERSION N/A 03/29/2018   Procedure: TRANSESOPHAGEAL ECHOCARDIOGRAM (TEE);  Surgeon: Larey Dresser, MD;  Location: [facility] ENDOSCOPY;  Service: Cardiovascular;  Laterality: N/A;  . TEE WITHOUT CARDIOVERSION N/A 04/19/2018   Procedure: TRANSESOPHAGEAL ECHOCARDIOGRAM (TEE);  Surgeon: Larey Dresser, MD;  Location: [facility] ENDOSCOPY;  Service: Cardiovascular;  Laterality: N/A;  . TEE WITHOUT CARDIOVERSION N/A 03/02/2020    Procedure: TRANSESOPHAGEAL ECHOCARDIOGRAM (TEE);  Surgeon: Larey Dresser, MD;  Location: [facility] ENDOSCOPY;  Service: Cardiovascular;  Laterality: N/A;  . TONSILLECTOMY AND ADENOIDECTOMY  1947  . TRANSTHORACIC ECHOCARDIOGRAM  02/2016   Mild LVH. Moderately reduced EF of 35-40% with diffuse hypokinesis. Indeterminate diastolic function. Likely elevated LA pressures. Mild aortic stenosis (mean gradient 10 mmHg). Mild MR. Mild LA dilation. Mild to moderate PA hypertension with 39 mmHg  . VIDEO ASSISTED THORACOSCOPY (VATS)/THOROCOTOMY Right 01/24/2015   Procedure: VIDEO ASSISTED THORACOSCOPY (VATS) FOR DRAINAGE OF RIGHT HEMOTHORAX;  Surgeon: Gaye Pollack, MD;  Location: [facility] OR;  Service: Thoracic;  Laterality: Right;    No Known Allergies  Immunization History  Administered Date(s) Administered  . Influenza Split 04/26/2011  . Influenza, High Dose Seasonal PF 02/12/2015, 03/29/2018, 01/25/2019  . PFIZER(Purple Top)SARS-COV-2 Vaccination 06/22/2019, 07/15/2019, 02/20/2020  . Pneumococcal Conjugate-13 05/21/2015  . Pneumococcal Polysaccharide-23 03/29/2018  . Td 05/15/2008    Family History  Problem Relation Age of Onset  . CAD Neg Hx      Current Outpatient Medications:  .  acetaminophen (TYLENOL) 500 MG tablet, Take 1,000 mg by mouth every 8 (eight) hours as needed (for pain or headaches)., Disp: , Rfl:  .  ADVAIR DISKUS 100-50 MCG/DOSE AEPB, INHALE 1 PUFF INTO THE LUNGS TWICE DAILY, Disp: 60 each, Rfl: 0 .  carvedilol (COREG) 25 MG tablet, Take 1 tablet (25 mg total) by mouth 2 (two) times daily with a meal., Disp: 180 tablet, Rfl: 3 .  cholecalciferol (VITAMIN D) 1000 units tablet, Take 1 tablet (1,000 Units total) by mouth daily., Disp: 30 tablet, Rfl: 0 .  ELIQUIS 5 MG TABS tablet, TAKE 1 TABLET(5 MG) BY MOUTH TWICE DAILY, Disp: 60 tablet, Rfl: 11 .  empagliflozin (JARDIANCE) 10 MG TABS tablet, Take 1 tablet (10 mg total) by mouth daily before breakfast., Disp: 30 tablet, Rfl: 11 .   ezetimibe (ZETIA) 10 MG tablet, Take 1 tablet (10 mg total) by mouth daily., Disp: 90 tablet, Rfl: 3 .  glipiZIDE (GLUCOTROL) 5 MG tablet, Take 1 tablet (5 mg total) by mouth daily before breakfast., Disp: 90 tablet, Rfl: 3 .  glucose blood test strip, Use as instructed, Disp: 100 each, Rfl: 12 .  icosapent Ethyl (VASCEPA) 1 g capsule, Take 2 capsules (2 g total) by mouth 2 (two) times daily., Disp: 120 capsule, Rfl: 11 .  insulin glargine (LANTUS SOLOSTAR) 100 UNIT/ML Solostar Pen, Inject 10 Units into the skin at bedtime., Disp: 15 mL, Rfl: 0 .  insulin lispro (HUMALOG) 100 UNIT/ML injection, Check BS tid with meals and qhs 0-200 no units, 201-250 2 units, 251-300 4 units, 301-350 6 units, 351-400 8 units, 401-450 10 units, call MD >450, Disp: 10 mL, Rfl: 0 .  Insulin Pen Needle (PEN NEEDLES) 32G X 4 MM MISC, Use 1 at bedtime with Lantus.  Dx code: E11.9, Disp: 100 each, Rfl: 0 .  metFORMIN (GLUCOPHAGE) 1000 MG tablet, Take 1 tablet (1,000 mg total) by mouth daily  with breakfast., Disp: 90 tablet, Rfl: 3 .  nitroGLYCERIN (NITROSTAT) 0.4 MG SL tablet, Place 1 tablet (0.4 mg total) under the tongue every 5 (five) minutes as needed for chest pain., Disp: 30 tablet, Rfl: 0 .  polyvinyl alcohol (LIQUIFILM TEARS) 1.4 % ophthalmic solution, Place 1 drop into both eyes daily as needed for dry eyes (irritation)., Disp: , Rfl:  .  predniSONE (DELTASONE) 10 MG tablet, TAKE 5 TABLETS X 21 DAYS, 4 TABLETS X 21 DAYS, 3 TABLETS X 21 DAYS, 2 TABLETS X 21 DAYS, 1 TABLET X 21 DAYS (then pick up 78m tabs), Disp: 315 tablet, Rfl: 0 .  predniSONE 5 MG TBEC, TAKE 1 TABLET X 10 DAYS, 1/2 TABLET X 10 DAYS, 1/2 TABLET EVERY OTHER DAY FOR 10 DAYS (THEN STOP), Disp: 20 tablet, Rfl: 0 .  ranolazine (RANEXA) 500 MG 12 hr tablet, Take 1 tablet (500 mg total) by mouth 2 (two) times daily., Disp: 180 tablet, Rfl: 0 .  sacubitril-valsartan (ENTRESTO) 24-26 MG, Take 1 tablet by mouth 2 (two) times daily., Disp: 60 tablet, Rfl: 5 .   sodium polystyrene (KAYEXALATE) 15 GM/60ML suspension, Take 60 mLs (15 g total) by mouth daily as needed., Disp: 240 mL, Rfl: 0 .  Sulfamethoxazole-Trimethoprim (BACTRIM PO), Take 1 tablet by mouth 3 (three) times a week., Disp: , Rfl:  .  atorvastatin (LIPITOR) 80 MG tablet, Take 1 tablet (80 mg total) by mouth daily., Disp: 30 tablet, Rfl: 6 .  torsemide (DEMADEX) 20 MG tablet, Take 1 tablet (20 mg total) by mouth in the morning. Start taking on 08/22/20, Disp: 30 tablet, Rfl: 0  Current Facility-Administered Medications:  .  insulin starter kit- pen needles (English) 1 kit, 1 kit, Other, Once, BMosie Lukes MD      Objective:   Vitals:   10/06/20 1412  BP: 114/76  Pulse: 92  Temp: 98 F (36.7 C)  SpO2: 96%  Weight: 262 lb (118.8 kg)  Height: 6' (1.829 m)    Estimated body mass index is 35.53 kg/m as calculated from the following:   Height as of this encounter: 6' (1.829 m).   Weight as of this encounter: 262 lb (118.8 kg).  _0 @  Filed Weights   10/06/20 1412  Weight: 262 lb (118.8 kg)     Physical Exam Overweight/obese frail elderly male.  Has a knee brace on the right knee.  Has a walker.  Alert and oriented x3.  Mild crackles on the lung.  Normal heart sounds.  Abdomen soft.  Chronic venous stasis edema present.  No stigmata of connective tissue disease.      Assessment:       ICD-10-CM   1. Amiodarone pulmonary toxicity  J98.4    T46.2X5A   2. Acute on chronic combined systolic and diastolic CHF (congestive heart failure) (HCC)  I50.43        Plan:     Patient Instructions     ICD-10-CM   1. Amiodarone pulmonary toxicity  J98.4    T46.2X5A   2. Acute on chronic combined systolic and diastolic CHF (congestive heart failure) (HCC)  I50.43    Glide is significantly better. Diagnosis acute amiodarone lung toxicity  Low BP - prob due to meds  Plan  68mprednisone daily from 08/17/20  ->  Wean by 1056mvery 3 weeks.  -  Currently should be  on 40 mg/day.  Approximately on May 31 you should go to 30 mg/day for 3 weeks and then 20 mg/day  for 3 weeks and then 10 mg/day for 3 weeks and then 5 mg/day for 3 weeks and slow taper after that 2 of  -  If recurrence of symptoms, then go back to last successful dose and prolong taper to over one year.   -- Continue bactrim PJP ppx  -We will hold off on chest x-ray today.   - cal your cardiologist or go to ER for low BP/dizziness  Follow-up - Return in 6-8 weeks to see nurse practitioner to ensure he is having continued improvement  ( Level 05 visit: Estb 40-54 min in  visit type: on-site physical face to visit  in total care time and counseling or/and coordination of care by this undersigned MD - Dr Brand Males. This includes one or more of the following on this same day 10/06/2020: pre-charting, chart review, note writing, documentation discussion of test results, diagnostic or treatment recommendations, prognosis, risks and benefits of management options, instructions, education, compliance or risk-factor reduction. It excludes time spent by the [place] or office staff in the care of the patient. Actual time 54 min)    SIGNATURE    Dr. Brand Males, M.D., F.C.C.P,  Pulmonary and Critical Care Medicine Staff Physician, [place] Director - Interstitial Lung Disease  Program  Pulmonary [place] [facility] [place], [place], [address]  Pager: [phone], If no answer or between  15:00h - 7:00h: call [phone] Telephone: [phone]  3:23 PM 10/06/2020

## 2020-10-06 NOTE — Patient Instructions (Addendum)
ICD-10-CM   1. Amiodarone pulmonary toxicity  J98.4    T46.2X5A   2. Acute on chronic combined systolic and diastolic CHF (congestive heart failure) (HCC)  I50.43    Glide is significantly better. Diagnosis acute amiodarone lung toxicity  Low BP - prob due to meds  Plan  60mg  prednisone daily from 08/17/20  ->  Wean by 10mg  every 3 weeks.  -  Currently should be on 40 mg/day.  Approximately on May 31 you should go to 30 mg/day for 3 weeks and then 20 mg/day for 3 weeks and then 10 mg/day for 3 weeks and then 5 mg/day for 3 weeks and slow taper after that 2 of  -  If recurrence of symptoms, then go back to last successful dose and prolong taper to over one year.   -- Continue bactrim PJP ppx  -We will hold off on chest x-ray today.   - cal your cardiologist or go to ER for low BP/dizziness  Follow-up - Return in 6-8 weeks to see nurse practitioner to ensure he is having continued improvement

## 2020-10-06 NOTE — Addendum Note (Signed)
Addended by: Dulcy Fanny on: 10/06/2020 03:41 PM   Modules accepted: Orders

## 2020-10-06 NOTE — Telephone Encounter (Signed)
Thanks Murali, we will adjust regimen.   Please have Mr Bischoff decrease Coreg to 12.5 mg bid and hold torsemide for a day.  He needs to come by office for nursing BP check, ECG, and BMET.

## 2020-10-06 NOTE — Telephone Encounter (Signed)
David Huynh/David Huynh/  Saw David Huynh for follow-up of pulmonary toxicity from amiodarone.  From a pulmonary standpoint he is doing well.  Whenever we try to walk him for oxygen desaturation test he complained of dizziness and refused.  So when we check blood pressure he notices sitting blood pressure was 88/64.  When he stood up it went down to 74/56.  Is scheduled for ablation but this is only in mid June 2022  IPlan -?  He needs to come down on his Coreg to stop the Coreg but I did not want to do that recommendation -I have advised him to go to the ER if it gets worse [after he left the office my CMA told me that he had a lot of difficulty getting to the car because of dizziness]    Current Outpatient Medications:  .  acetaminophen (TYLENOL) 500 MG tablet, Take 1,000 mg by mouth every 8 (eight) hours as needed (for pain or headaches)., Disp: , Rfl:  .  ADVAIR DISKUS 100-50 MCG/DOSE AEPB, INHALE 1 PUFF INTO THE LUNGS TWICE DAILY, Disp: 60 each, Rfl: 0 .  atorvastatin (LIPITOR) 80 MG tablet, Take 1 tablet (80 mg total) by mouth daily., Disp: 30 tablet, Rfl: 6 .  carvedilol (COREG) 25 MG tablet, Take 1 tablet (25 mg total) by mouth 2 (two) times daily with a meal., Disp: 180 tablet, Rfl: 3 .  cholecalciferol (VITAMIN D) 1000 units tablet, Take 1 tablet (1,000 Units total) by mouth daily., Disp: 30 tablet, Rfl: 0 .  ELIQUIS 5 MG TABS tablet, TAKE 1 TABLET(5 MG) BY MOUTH TWICE DAILY, Disp: 60 tablet, Rfl: 11 .  empagliflozin (JARDIANCE) 10 MG TABS tablet, Take 1 tablet (10 mg total) by mouth daily before breakfast., Disp: 30 tablet, Rfl: 11 .  ezetimibe (ZETIA) 10 MG tablet, Take 1 tablet (10 mg total) by mouth daily., Disp: 90 tablet, Rfl: 3 .  glipiZIDE (GLUCOTROL) 5 MG tablet, Take 1 tablet (5 mg total) by mouth daily before breakfast., Disp: 90 tablet, Rfl: 3 .  glucose blood test strip, Use as instructed, Disp: 100 each, Rfl: 12 .  icosapent Ethyl (VASCEPA) 1 g capsule, Take 2 capsules (2 g  total) by mouth 2 (two) times daily., Disp: 120 capsule, Rfl: 11 .  insulin glargine (LANTUS SOLOSTAR) 100 UNIT/ML Solostar Pen, Inject 10 Units into the skin at bedtime., Disp: 15 mL, Rfl: 0 .  insulin lispro (HUMALOG) 100 UNIT/ML injection, Check BS tid with meals and qhs 0-200 no units, 201-250 2 units, 251-300 4 units, 301-350 6 units, 351-400 8 units, 401-450 10 units, call MD >450, Disp: 10 mL, Rfl: 0 .  Insulin Pen Needle (PEN NEEDLES) 32G X 4 MM MISC, Use 1 at bedtime with Lantus.  Dx code: E11.9, Disp: 100 each, Rfl: 0 .  metFORMIN (GLUCOPHAGE) 1000 MG tablet, Take 1 tablet (1,000 mg total) by mouth daily with breakfast., Disp: 90 tablet, Rfl: 3 .  nitroGLYCERIN (NITROSTAT) 0.4 MG SL tablet, Place 1 tablet (0.4 mg total) under the tongue every 5 (five) minutes as needed for chest pain., Disp: 30 tablet, Rfl: 0 .  polyvinyl alcohol (LIQUIFILM TEARS) 1.4 % ophthalmic solution, Place 1 drop into both eyes daily as needed for dry eyes (irritation)., Disp: , Rfl:  .  predniSONE (DELTASONE) 10 MG tablet, TAKE 5 TABLETS X 21 DAYS, 4 TABLETS X 21 DAYS, 3 TABLETS X 21 DAYS, 2 TABLETS X 21 DAYS, 1 TABLET X 21 DAYS (then pick up 91m tabs), Disp: 315  tablet, Rfl: 0 .  [START ON 10/12/2020] predniSONE (DELTASONE) 10 MG tablet, Take 3 tablets (30 mg total) by mouth daily with breakfast for 21 days, THEN 2 tablets (20 mg total) daily with breakfast for 21 days, THEN 1 tablet (10 mg total) daily with breakfast for 21 days, THEN 0.5 tablets (5 mg total) daily with breakfast for 21 days., Disp: 20 tablet, Rfl: 0 .  predniSONE 5 MG TBEC, TAKE 1 TABLET X 10 DAYS, 1/2 TABLET X 10 DAYS, 1/2 TABLET EVERY OTHER DAY FOR 10 DAYS (THEN STOP), Disp: 20 tablet, Rfl: 0 .  ranolazine (RANEXA) 500 MG 12 hr tablet, Take 1 tablet (500 mg total) by mouth 2 (two) times daily., Disp: 180 tablet, Rfl: 0 .  sacubitril-valsartan (ENTRESTO) 24-26 MG, Take 1 tablet by mouth 2 (two) times daily., Disp: 60 tablet, Rfl: 5 .  sodium  polystyrene (KAYEXALATE) 15 GM/60ML suspension, Take 60 mLs (15 g total) by mouth daily as needed., Disp: 240 mL, Rfl: 0 .  Sulfamethoxazole-Trimethoprim (BACTRIM PO), Take 1 tablet by mouth 3 (three) times a week., Disp: , Rfl:  .  torsemide (DEMADEX) 20 MG tablet, Take 1 tablet (20 mg total) by mouth in the morning. Start taking on 08/22/20, Disp: 30 tablet, Rfl: 0  Current Facility-Administered Medications:  .  insulin starter kit- pen needles (English) 1 kit, 1 kit, Other, Once, [name], MD

## 2020-10-07 MED ORDER — CARVEDILOL 12.5 MG PO TABS: 12.5000 mg | ORAL_TABLET | Freq: Two times a day (BID) | ORAL | 3 refills | Status: DC

## 2020-10-07 NOTE — Telephone Encounter (Signed)
Patient advised and verbalized understanding. patient states that he is out of town for the rest of the week and wont be back until next week but states he feels much better today. Yer has a pending appointment with our office on 10/21/20(2 weeks).Med list updated to reflect changes;

## 2020-10-13 ENCOUNTER — Other Ambulatory Visit: Payer: Medicare Other

## 2020-10-13 ENCOUNTER — Other Ambulatory Visit: Payer: Self-pay

## 2020-10-13 DIAGNOSIS — I4892 Unspecified atrial flutter: Secondary | ICD-10-CM

## 2020-10-13 DIAGNOSIS — I43 Cardiomyopathy in diseases classified elsewhere: Secondary | ICD-10-CM | POA: Diagnosis not present

## 2020-10-13 DIAGNOSIS — D6869 Other thrombophilia: Secondary | ICD-10-CM | POA: Diagnosis not present

## 2020-10-13 DIAGNOSIS — I4819 Other persistent atrial fibrillation: Secondary | ICD-10-CM | POA: Diagnosis not present

## 2020-10-13 DIAGNOSIS — R000 Tachycardia, unspecified: Secondary | ICD-10-CM | POA: Diagnosis not present

## 2020-10-13 DIAGNOSIS — I5022 Chronic systolic (congestive) heart failure: Secondary | ICD-10-CM | POA: Diagnosis not present

## 2020-10-13 DIAGNOSIS — I2510 Atherosclerotic heart disease of native coronary artery without angina pectoris: Secondary | ICD-10-CM | POA: Diagnosis not present

## 2020-10-13 LAB — CBC WITH DIFFERENTIAL/PLATELET
Basophils Absolute: 0.1 10*3/uL (ref 0.0–0.2)
Basos: 1 %
EOS (ABSOLUTE): 0.1 10*3/uL (ref 0.0–0.4)
Eos: 0 %
Hematocrit: 37.1 % — ABNORMAL LOW (ref 37.5–51.0)
Hemoglobin: 12.8 g/dL — ABNORMAL LOW (ref 13.0–17.7)
Immature Grans (Abs): 0.5 10*3/uL — ABNORMAL HIGH (ref 0.0–0.1)
Immature Granulocytes: 4 %
Lymphocytes Absolute: 1.5 10*3/uL (ref 0.7–3.1)
Lymphs: 11 %
MCH: 31.4 pg (ref 26.6–33.0)
MCHC: 34.5 g/dL (ref 31.5–35.7)
MCV: 91 fL (ref 79–97)
Monocytes Absolute: 0.6 10*3/uL (ref 0.1–0.9)
Monocytes: 5 %
Neutrophils Absolute: 10.8 10*3/uL — ABNORMAL HIGH (ref 1.4–7.0)
Neutrophils: 79 %
Platelets: 250 10*3/uL (ref 150–450)
RBC: 4.07 x10E6/uL — ABNORMAL LOW (ref 4.14–5.80)
RDW: 14.5 % (ref 11.6–15.4)
WBC: 13.6 10*3/uL — ABNORMAL HIGH (ref 3.4–10.8)

## 2020-10-13 LAB — BASIC METABOLIC PANEL
BUN/Creatinine Ratio: 25 — ABNORMAL HIGH (ref 10–24)
BUN: 41 mg/dL — ABNORMAL HIGH (ref 8–27)
CO2: 24 mmol/L (ref 20–29)
Calcium: 8.9 mg/dL (ref 8.6–10.2)
Chloride: 94 mmol/L — ABNORMAL LOW (ref 96–106)
Creatinine, Ser: 1.63 mg/dL — ABNORMAL HIGH (ref 0.76–1.27)
Glucose: 175 mg/dL — ABNORMAL HIGH (ref 65–99)
Potassium: 4.1 mmol/L (ref 3.5–5.2)
Sodium: 135 mmol/L (ref 134–144)
eGFR: 43 mL/min/{1.73_m2} — ABNORMAL LOW (ref >59)

## 2020-10-21 ENCOUNTER — Other Ambulatory Visit: Payer: Self-pay

## 2020-10-21 ENCOUNTER — Ambulatory Visit (HOSPITAL_COMMUNITY)
Admission: RE | Admit: 2020-10-21 | Discharge: 2020-10-21 | Disposition: A | Discharge status: Home / Self Care | Payer: Medicare Other | Source: Ambulatory Visit | Attending: Cardiology | Admitting: Cardiology

## 2020-10-21 ENCOUNTER — Encounter (HOSPITAL_COMMUNITY): Payer: Self-pay

## 2020-10-21 VITALS — BP 114/68 | HR 92 | Wt 268.4 lb

## 2020-10-21 DIAGNOSIS — I5032 Chronic diastolic (congestive) heart failure: Secondary | ICD-10-CM | POA: Diagnosis not present

## 2020-10-21 DIAGNOSIS — Z79899 Other long term (current) drug therapy: Secondary | ICD-10-CM | POA: Insufficient documentation

## 2020-10-21 DIAGNOSIS — E1122 Type 2 diabetes mellitus with diabetic chronic kidney disease: Secondary | ICD-10-CM | POA: Diagnosis not present

## 2020-10-21 DIAGNOSIS — I080 Rheumatic disorders of both mitral and aortic valves: Secondary | ICD-10-CM | POA: Insufficient documentation

## 2020-10-21 DIAGNOSIS — Z7984 Long term (current) use of oral hypoglycemic drugs: Secondary | ICD-10-CM | POA: Insufficient documentation

## 2020-10-21 DIAGNOSIS — I2510 Atherosclerotic heart disease of native coronary artery without angina pectoris: Secondary | ICD-10-CM | POA: Diagnosis not present

## 2020-10-21 DIAGNOSIS — Z7901 Long term (current) use of anticoagulants: Secondary | ICD-10-CM | POA: Diagnosis not present

## 2020-10-21 DIAGNOSIS — I5042 Chronic combined systolic (congestive) and diastolic (congestive) heart failure: Secondary | ICD-10-CM | POA: Diagnosis not present

## 2020-10-21 DIAGNOSIS — E785 Hyperlipidemia, unspecified: Secondary | ICD-10-CM | POA: Diagnosis not present

## 2020-10-21 DIAGNOSIS — Z7951 Long term (current) use of inhaled steroids: Secondary | ICD-10-CM | POA: Diagnosis not present

## 2020-10-21 DIAGNOSIS — I480 Paroxysmal atrial fibrillation: Secondary | ICD-10-CM | POA: Insufficient documentation

## 2020-10-21 DIAGNOSIS — Z955 Presence of coronary angioplasty implant and graft: Secondary | ICD-10-CM | POA: Diagnosis not present

## 2020-10-21 DIAGNOSIS — Z794 Long term (current) use of insulin: Secondary | ICD-10-CM | POA: Insufficient documentation

## 2020-10-21 DIAGNOSIS — N183 Chronic kidney disease, stage 3 unspecified: Secondary | ICD-10-CM | POA: Insufficient documentation

## 2020-10-21 DIAGNOSIS — I130 Hypertensive heart and chronic kidney disease with heart failure and stage 1 through stage 4 chronic kidney disease, or unspecified chronic kidney disease: Secondary | ICD-10-CM | POA: Diagnosis not present

## 2020-10-21 NOTE — Progress Notes (Signed)
Advanced Heart Failure Clinic Note   PCP: David Canary, MD  Cardiologist: David Ancona, MD   David Huynh is a 80 y.o. male with a history of CAD, chronic systolic CHF, HTN, HLD, DM, CKD 3, and asthma. Patient had EF 35-40% in 9/17, had cath with DES to RCA then repeat echo with EF up to 55-60% by 3/18.   Admitted 03/2018 with SOB x 3 weeks. No cardiology follow up in >1 year. Found to be in atrial fibrillation with RVR. EF down to 35-40%. Poor diuresis with IV lasix, so HF team consulted. He was started on amiodarone drip. PICC line placed with CVP markedly elevated and Coox marginal. Held off on inotropes with concern for making afib worse. Started on lasix drip. He started eliquis and had TEE/DCCV 03/29/18. Was converted to NSR, but shortly after went back into AF. Ranexa added. Attempted repeat DCCV 04/02/18, but was unsuccessful. EP consulted and recommended repeat LHC prior to repeat DCCV. Had [facility] 04/03/18 with PCI to proximal LCx.  He then had TEE-DCCV on 04/19/18 back to NSR. EF on TEE was 40%.  He saw Dr. Johney Huynh to discuss ablation and opted to continue amiodarone.   Echo in 3/20 showed EF 50% with diffuse hypokinesis, normal RV, mild AS and mild MR.    He was lost to followup in this office again for over a year.  In 7/21, he was admitted for a couple of days with CHF exacerbation and was treated with IV Lasix.  Echo was done in 7/21, EF back down to 25-30%, global hypokinesis, severely decreased RV systolic function with severe RV enlargement, at least moderate aortic stenosis.  He was in NSR.    TEE was done in 10/21 for closer evaluation of aortic valve, this showed EF actually up to 50% with moderate AS and moderate MR.  LHC/RHC was done, showing normal filling pressures and significant LAD disease.  The patient is now s/p DES to LAD.   He was admitted on [facility] on 08/10/20 with atrial fibrillation/RVR and CHF.  Amiodarone was started and he was cardioverted to NSR.  Echo in  3/22 showed EF 50-55% with normal RV and mild AS, mild MR.  He developed acute amiodarone lung toxicity with elevated ESR and patchy bilateral infiltrates.  Amiodarone was stopped and prednisone was started.  Oxygen saturation gradually increased over time and he was eventually able to wean off oxygen.   At last clinic f/u 5/22, Dr. Shirlee Huynh reduced David Huynh to 24-26 bid given improved EF on recent echo and bump in SCr. Pt was also recently seen in Pulmonology clinic and complained of dizziness. BP was low and Coreg was subsequently reduced down to 12.5 mg bid.   He returns to clinic today for f/u. Here w/ wife. Feeling better after med reductions. No further dizziness nor hypotension. SBP 114/68 in clinic today. Volume/ wt stable since Entresto was cut back . Functional status the same. He is scheduled for AF ablation w/ Dr. Johney Huynh next week. Reports full compliance w/ Eliquis. HR controlled.   ECG: not performed    Labs (12/19): K 4.2, creatinine 1.35, LFTs normal Las (7/21): LDL 198, K 3.7, creatinine 1.75 Labs (8/21): K 4, creatinine 1.48, BNP 593 Labs (10/21): K 3.9, creatinine 1.28, LDL 230 (not taking statin), TSH normal, LFTs normal Labs (11/21): K 3.8, creatinine 1.34 Labs (4/22): K 6, creatinine 2.41 Labs (5/22): hgb 12.8, K 4, creatinine 1.52  SH: No tobacco or drug use. Rare  ETOH use. Lives at home with his wife in [place]. 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  FH: No family hx of CAD or CHF.   Review of systems complete and found to be negative unless listed in HPI.   PMH: 1. CKD: Stage 3.  2. Atrial fibrillation: Paroxysmal => persistent, poorly tolerated with history of tachycardia-mediated CMP.   - DCCV x 2 in 11/19 and once in 12/19.  - DCCV to NSR in 3/22 on amiodarone, developed amiodarone lung toxicity and amiodarone stopped.  Went back into atrial fibrillation.  3. Type II diabetes.  4. HTN 5. Hyperlipidemia.  6. CAD: DES to mid RCA in 2017.   - LHC (11/19): 50% mLAD, 50%  dLAD, 80-85% proximal LCx stenosis, OM1 small vessel with 90% stenois. PCI to proximal LCx.  - LHC (10/21): 80% pLAD, 70% p/mLAD, 50% m/d LAD, 80% superior branch OM2, 60% inferior branch OM2, 50% mPDA. PCI with DES to LAD.  7. Chronic systolic CHF: Suspect this was primarily tachy-cardia mediated.  - Echo (11/19): EF 35-40%, moderate to severely decreased RV systolic function.  - TEE (12/19): EF 40%, diffuse hypokinesis, normal RV size and systolic function.  - Echo (3/20): EF 50%, normal RV, mild MR, mild AS with mean gradient 14 mmHg. - Echo (7/21): EF 25-30%, global hypokinesis, severely decreased RV systolic function with severe RV enlargement, at least moderate aortic stenosis.   - TEE (10/21): EF 50%, anterior hypokinesis, basal inferior hypokinesis, normal RV, moderate AS with mean gradient 22 mmHg and AVA 1.18 cm^2, moderate MR with ERO 0.32 cm^2.  - Echo (3/22): EF 50-55% with normal RV and mild AS, mild MR - RHC (10/21): mean RA 2, PA 29/4, mean PCWP 6, CI 2.66, easily crossed AoV with mean gradient 15 mmHg.  8. Aortic stenosis: Mild by 3/20 echo. 7/21 echo with at least moderate AS.  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    Current Outpatient Medications  Medication Sig Dispense Refill   acetaminophen (TYLENOL) 500 MG tablet Take 1,000 mg by mouth every 6 (six) hours as needed for moderate pain or headache.     ADVAIR DISKUS 100-50 MCG/DOSE AEPB INHALE 1 PUFF INTO THE LUNGS TWICE DAILY (Patient taking differently: Inhale 1 puff into the lungs 2 (two) times daily.) 60 each 0   atorvastatin (LIPITOR) 80 MG tablet Take 1 tablet (80 mg total) by mouth daily. 30 tablet 6   carvedilol (COREG) 25 MG tablet Take 12.5 mg by mouth 2 (two) times daily with a meal.     cholecalciferol (VITAMIN D) 1000 units tablet Take 1 tablet (1,000 Units total) by mouth daily. 30 tablet 0   ELIQUIS 5 MG TABS tablet TAKE 1 TABLET(5 MG) BY MOUTH TWICE DAILY (Patient taking differently:  Take 5 mg by mouth 2 (two) times daily.) 60 tablet 11   empagliflozin (JARDIANCE) 10 MG TABS tablet Take 1 tablet (10 mg total) by mouth daily before breakfast. 30 tablet 11   ezetimibe (ZETIA) 10 MG tablet Take 1 tablet (10 mg total) by mouth daily. 90 tablet 3   glipiZIDE (GLUCOTROL) 5 MG tablet Take 1 tablet (5 mg total) by mouth daily before breakfast. 90 tablet 3   glucose blood test strip Use as instructed 100 each 12   icosapent Ethyl (VASCEPA) 1 g capsule Take 2 capsules (2 g total) by mouth 2 (two) times daily. 120 capsule 11   insulin glargine (LANTUS SOLOSTAR) 100 UNIT/ML Solostar Pen Inject 10 Units into the skin  at bedtime. 15 mL 0   insulin lispro (HUMALOG) 100 UNIT/ML injection Check BS tid with meals and qhs 0-200 no units, 201-250 2 units, 251-300 4 units, 301-350 6 units, 351-400 8 units, 401-450 10 units, call MD >450 (Patient taking differently: Inject 0-10 Units into the skin 3 (three) times daily with meals. Check BS tid with meals and qhs 0-200 no units, 201-250 2 units, 251-300 4 units, 301-350 6 units, 351-400 8 units, 401-450 10 units, call MD >450) 10 mL 0   Insulin Pen Needle (PEN NEEDLES) 32G X 4 MM MISC Use 1 at bedtime with Lantus.  Dx code: E11.9 100 each 0   metFORMIN (GLUCOPHAGE) 1000 MG tablet Take 1 tablet (1,000 mg total) by mouth daily with breakfast. 90 tablet 3   nitroGLYCERIN (NITROSTAT) 0.4 MG SL tablet Place 1 tablet (0.4 mg total) under the tongue every 5 (five) minutes as needed for chest pain. 30 tablet 0   polyvinyl alcohol (LIQUIFILM TEARS) 1.4 % ophthalmic solution Place 1 drop into both eyes daily as needed for dry eyes (irritation).     predniSONE (DELTASONE) 10 MG tablet Take 3 tablets (30 mg total) by mouth daily with breakfast for 21 days, THEN 2 tablets (20 mg total) daily with breakfast for 21 days, THEN 1 tablet (10 mg total) daily with breakfast for 21 days, THEN 0.5 tablets (5 mg total) daily with breakfast for 21 days. 20 tablet 0   ranolazine  (RANEXA) 500 MG 12 hr tablet Take 1 tablet (500 mg total) by mouth 2 (two) times daily. 180 tablet 0   sacubitril-valsartan (ENTRESTO) 24-26 MG Take 1 tablet by mouth 2 (two) times daily. 60 tablet 5   sodium polystyrene (KAYEXALATE) 15 GM/60ML suspension Take 60 mLs (15 g total) by mouth daily as needed. 240 mL 0   torsemide (DEMADEX) 20 MG tablet Take 1 tablet (20 mg total) by mouth in the morning. Start taking on 08/22/20 30 tablet 0   Current Facility-Administered Medications  Medication Dose Route Frequency Provider Last Rate Last Admin   insulin starter kit- pen needles (English) 1 kit  1 kit Other Once [name], MD        No Known Allergies   Vitals:   10/21/20 1159  BP: 114/68  Pulse: 92  SpO2: 98%  Weight: 121.7 kg (268 lb 6.4 oz)    Wt Readings from Last 3 Encounters:  10/21/20 121.7 kg (268 lb 6.4 oz)  10/06/20 118.8 kg (262 lb)  09/28/20 121.7 kg (268 lb 3.2 oz)   PHYSICAL EXAM: General:  Well appearing, moderately obese. No respiratory difficulty HEENT: normal Neck: supple.  JVD not elevated. Carotids 2+ bilat; no bruits. No lymphadenopathy or thyromegaly appreciated. Cor: PMI nondisplaced. Irregularly irregular rhythm, regular rate. No rubs, gallops or murmurs. Lungs: clear Abdomen: soft, nontender, nondistended. No hepatosplenomegaly. No bruits or masses. Good bowel sounds. Extremities: no cyanosis, clubbing, rash, trace bilateral ankle edema Neuro: alert & oriented x 3, cranial nerves grossly intact. moves all 4 extremities w/o difficulty. Affect pleasant.   ASSESSMENT & PLAN:  1. Chronic systolic=>diastolic CHF: Prior ischemic cardiomyopathy, improved after PCI in 2017.  Echo 03/2018 with EF back down to 35-40% and moderate-severe RV dysfunction. This was in the setting of atrial fibrillation with RVR. EF 40% on TEE 04/2018.  Echo was in 3/20, showing EF up to 50%.  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  Echo in 7/21  with EF 25-30%, severe  RV dysfunction.  TEE in 10/21, however, showed EF up to 50%.  He had PCI to LAD in 10/21. Echo in 3/22 showed stable EF 50-55% with normal RV.  NYHA class II-III symptoms.  He has peripheral edema but no JVD. Wt stable since meds cut back. Advised to elevate legs at home and wear compression stockings.  - Continue torsemide 20 mg daily.    - Continue Entresto to 24/26 bid (dose recently reduced given improved EF and bump in SCr) - Continue Coreg 12.5 mg bid (did not tolerate 25 mg dose due to low BP and dizziness)  - Compression stockings for peripheral edema.  2. Atrial fibrillation: As above, concerned for possible tachy-mediated CMP.  Had successful DC-CV on 03/29/18 but then back in atrial fibrillation. Ranolazine added, but failed DCCV again 04/02/18. DCCV 04/19/18 was successful.  AF with RVR at 3/22 admission, amiodarone restarted and he was cardioverted to NSR, but he developed amiodarone lung toxicity.  Amiodarone was stopped.  He has subsequently gone back into rate-controlled atrial fibrillation.  - Currently on ranolazine 500 mg BID - No amiodarone with history of lung toxicity.   - Continue Eliquis 5 mg BID.   - Plan for atrial fibrillation ablation with Dr. Rayann Heman next week  3. CKD: Stage 3.  Reviewed BMP from this week, SCr stable at 1.6  4. CAD: History of DES to RCA in 9/17.  Underwent LHC 04/03/18 with PCI to prox left circ. LHC in 10/21 with PCI to LAD.   - He is on Eliquis so no ASA.   - He is now off Plavix after 6 months (stopped prior to AF ablation).  - Continue statin.  5. DM2: Will need to get him on SGLT2 inhibitor in the future.  6. Aortic stenosis: Mild on 3/22 echo.  7. Mitral regurgitation: Mild on 3/22 echo.  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 He is off amiodarone.  He improved with steroids.   - Continue steroid taper per pulmonary, he is followed with pulmonary.  - Bactrim for PJP prophylaxis   Getting AF ablation next week.  Post procedural f/u scheduled in Afib clinic next month. F/u w/ Dr. Aundra Dubin in 2-3 months.   Lyda Jester, PA-C 10/21/20

## 2020-10-21 NOTE — H&P (View-Only) (Signed)
Advanced Heart Failure Clinic Note   PCP: Bradd Canary, MD  Cardiologist: [name], MD   David Huynh is a 80 y.o. male with a history of CAD, chronic systolic CHF, HTN, HLD, DM, CKD 3, and asthma. Patient had EF 35-40% in 9/17, had cath with DES to RCA then repeat echo with EF up to 55-60% by 3/18.   Admitted 03/2018 with SOB x 3 weeks. No cardiology follow up in >1 year. Found to be in atrial fibrillation with RVR. EF down to 35-40%. Poor diuresis with IV lasix, so HF team consulted. He was started on amiodarone drip. PICC line placed with CVP markedly elevated and Coox marginal. Held off on inotropes with concern for making afib worse. Started on lasix drip. He started eliquis and had TEE/DCCV 03/29/18. Was converted to NSR, but shortly after went back into AF. Ranexa added. Attempted repeat DCCV 04/02/18, but was unsuccessful. EP consulted and recommended repeat LHC prior to repeat DCCV. Had [facility] 04/03/18 with PCI to proximal LCx.  He then had TEE-DCCV on 04/19/18 back to NSR. EF on TEE was 40%.  He saw Dr. Johney Frame to discuss ablation and opted to continue amiodarone.   Echo in 3/20 showed EF 50% with diffuse hypokinesis, normal RV, mild AS and mild MR.    He was lost to followup in this office again for over a year.  In 7/21, he was admitted for a couple of days with CHF exacerbation and was treated with IV Lasix.  Echo was done in 7/21, EF back down to 25-30%, global hypokinesis, severely decreased RV systolic function with severe RV enlargement, at least moderate aortic stenosis.  He was in NSR.    TEE was done in 10/21 for closer evaluation of aortic valve, this showed EF actually up to 50% with moderate AS and moderate MR.  LHC/RHC was done, showing normal filling pressures and significant LAD disease.  The patient is now s/p DES to LAD.   He was admitted on [facility] on 08/10/20 with atrial fibrillation/RVR and CHF.  Amiodarone was started and he was cardioverted to NSR.  Echo in  3/22 showed EF 50-55% with normal RV and mild AS, mild MR.  He developed acute amiodarone lung toxicity with elevated ESR and patchy bilateral infiltrates.  Amiodarone was stopped and prednisone was started.  Oxygen saturation gradually increased over time and he was eventually able to wean off oxygen.   At last clinic f/u 5/22, Dr. Shirlee Latch reduced David Huynh to 24-26 bid given improved EF on recent echo and bump in SCr. Pt was also recently seen in Pulmonology clinic and complained of dizziness. BP was low and Coreg was subsequently reduced down to 12.5 mg bid.   He returns to clinic today for f/u. Here w/ wife. Feeling better after med reductions. No further dizziness nor hypotension. SBP 114/68 in clinic today. Volume/ wt stable since Entresto was cut back . Functional status the same. He is scheduled for AF ablation w/ Dr. Johney Frame next week. Reports full compliance w/ Eliquis. HR controlled.   ECG: not performed    Labs (12/19): K 4.2, creatinine 1.35, LFTs normal Las (7/21): LDL 198, K 3.7, creatinine 1.75 Labs (8/21): K 4, creatinine 1.48, BNP 593 Labs (10/21): K 3.9, creatinine 1.28, LDL 230 (not taking statin), TSH normal, LFTs normal Labs (11/21): K 3.8, creatinine 1.34 Labs (4/22): K 6, creatinine 2.41 Labs (5/22): hgb 12.8, K 4, creatinine 1.52  SH: No tobacco or drug use. Rare  ETOH use. Lives at home with his wife in [place]. 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  FH: No family hx of CAD or CHF.   Review of systems complete and found to be negative unless listed in HPI.   PMH: 1. CKD: Stage 3.  2. Atrial fibrillation: Paroxysmal => persistent, poorly tolerated with history of tachycardia-mediated CMP.   - DCCV x 2 in 11/19 and once in 12/19.  - DCCV to NSR in 3/22 on amiodarone, developed amiodarone lung toxicity and amiodarone stopped.  Went back into atrial fibrillation.  3. Type II diabetes.  4. HTN 5. Hyperlipidemia.  6. CAD: DES to mid RCA in 2017.   - LHC (11/19): 50% mLAD, 50%  dLAD, 80-85% proximal LCx stenosis, OM1 small vessel with 90% stenois. PCI to proximal LCx.  - LHC (10/21): 80% pLAD, 70% p/mLAD, 50% m/d LAD, 80% superior branch OM2, 60% inferior branch OM2, 50% mPDA. PCI with DES to LAD.  7. Chronic systolic CHF: Suspect this was primarily tachy-cardia mediated.  - Echo (11/19): EF 35-40%, moderate to severely decreased RV systolic function.  - TEE (12/19): EF 40%, diffuse hypokinesis, normal RV size and systolic function.  - Echo (3/20): EF 50%, normal RV, mild MR, mild AS with mean gradient 14 mmHg. - Echo (7/21): EF 25-30%, global hypokinesis, severely decreased RV systolic function with severe RV enlargement, at least moderate aortic stenosis.   - TEE (10/21): EF 50%, anterior hypokinesis, basal inferior hypokinesis, normal RV, moderate AS with mean gradient 22 mmHg and AVA 1.18 cm^2, moderate MR with ERO 0.32 cm^2.  - Echo (3/22): EF 50-55% with normal RV and mild AS, mild MR - RHC (10/21): mean RA 2, PA 29/4, mean PCWP 6, CI 2.66, easily crossed AoV with mean gradient 15 mmHg.  8. Aortic stenosis: Mild by 3/20 echo. 7/21 echo with at least moderate AS.  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    Current Outpatient Medications  Medication Sig Dispense Refill   acetaminophen (TYLENOL) 500 MG tablet Take 1,000 mg by mouth every 6 (six) hours as needed for moderate pain or headache.     ADVAIR DISKUS 100-50 MCG/DOSE AEPB INHALE 1 PUFF INTO THE LUNGS TWICE DAILY (Patient taking differently: Inhale 1 puff into the lungs 2 (two) times daily.) 60 each 0   atorvastatin (LIPITOR) 80 MG tablet Take 1 tablet (80 mg total) by mouth daily. 30 tablet 6   carvedilol (COREG) 25 MG tablet Take 12.5 mg by mouth 2 (two) times daily with a meal.     cholecalciferol (VITAMIN D) 1000 units tablet Take 1 tablet (1,000 Units total) by mouth daily. 30 tablet 0   ELIQUIS 5 MG TABS tablet TAKE 1 TABLET(5 MG) BY MOUTH TWICE DAILY (Patient taking differently:  Take 5 mg by mouth 2 (two) times daily.) 60 tablet 11   empagliflozin (JARDIANCE) 10 MG TABS tablet Take 1 tablet (10 mg total) by mouth daily before breakfast. 30 tablet 11   ezetimibe (ZETIA) 10 MG tablet Take 1 tablet (10 mg total) by mouth daily. 90 tablet 3   glipiZIDE (GLUCOTROL) 5 MG tablet Take 1 tablet (5 mg total) by mouth daily before breakfast. 90 tablet 3   glucose blood test strip Use as instructed 100 each 12   icosapent Ethyl (VASCEPA) 1 g capsule Take 2 capsules (2 g total) by mouth 2 (two) times daily. 120 capsule 11   insulin glargine (LANTUS SOLOSTAR) 100 UNIT/ML Solostar Pen Inject 10 Units into the skin  at bedtime. 15 mL 0   insulin lispro (HUMALOG) 100 UNIT/ML injection Check BS tid with meals and qhs 0-200 no units, 201-250 2 units, 251-300 4 units, 301-350 6 units, 351-400 8 units, 401-450 10 units, call MD >450 (Patient taking differently: Inject 0-10 Units into the skin 3 (three) times daily with meals. Check BS tid with meals and qhs 0-200 no units, 201-250 2 units, 251-300 4 units, 301-350 6 units, 351-400 8 units, 401-450 10 units, call MD >450) 10 mL 0   Insulin Pen Needle (PEN NEEDLES) 32G X 4 MM MISC Use 1 at bedtime with Lantus.  Dx code: E11.9 100 each 0   metFORMIN (GLUCOPHAGE) 1000 MG tablet Take 1 tablet (1,000 mg total) by mouth daily with breakfast. 90 tablet 3   nitroGLYCERIN (NITROSTAT) 0.4 MG SL tablet Place 1 tablet (0.4 mg total) under the tongue every 5 (five) minutes as needed for chest pain. 30 tablet 0   polyvinyl alcohol (LIQUIFILM TEARS) 1.4 % ophthalmic solution Place 1 drop into both eyes daily as needed for dry eyes (irritation).     predniSONE (DELTASONE) 10 MG tablet Take 3 tablets (30 mg total) by mouth daily with breakfast for 21 days, THEN 2 tablets (20 mg total) daily with breakfast for 21 days, THEN 1 tablet (10 mg total) daily with breakfast for 21 days, THEN 0.5 tablets (5 mg total) daily with breakfast for 21 days. 20 tablet 0   ranolazine  (RANEXA) 500 MG 12 hr tablet Take 1 tablet (500 mg total) by mouth 2 (two) times daily. 180 tablet 0   sacubitril-valsartan (ENTRESTO) 24-26 MG Take 1 tablet by mouth 2 (two) times daily. 60 tablet 5   sodium polystyrene (KAYEXALATE) 15 GM/60ML suspension Take 60 mLs (15 g total) by mouth daily as needed. 240 mL 0   torsemide (DEMADEX) 20 MG tablet Take 1 tablet (20 mg total) by mouth in the morning. Start taking on 08/22/20 30 tablet 0   Current Facility-Administered Medications  Medication Dose Route Frequency Provider Last Rate Last Admin   insulin starter kit- pen needles (English) 1 kit  1 kit Other Once [name], MD        No Known Allergies   Vitals:   10/21/20 1159  BP: 114/68  Pulse: 92  SpO2: 98%  Weight: 121.7 kg (268 lb 6.4 oz)    Wt Readings from Last 3 Encounters:  10/21/20 121.7 kg (268 lb 6.4 oz)  10/06/20 118.8 kg (262 lb)  09/28/20 121.7 kg (268 lb 3.2 oz)   PHYSICAL EXAM: General:  Well appearing, moderately obese. No respiratory difficulty HEENT: normal Neck: supple.  JVD not elevated. Carotids 2+ bilat; no bruits. No lymphadenopathy or thyromegaly appreciated. Cor: PMI nondisplaced. Irregularly irregular rhythm, regular rate. No rubs, gallops or murmurs. Lungs: clear Abdomen: soft, nontender, nondistended. No hepatosplenomegaly. No bruits or masses. Good bowel sounds. Extremities: no cyanosis, clubbing, rash, trace bilateral ankle edema Neuro: alert & oriented x 3, cranial nerves grossly intact. moves all 4 extremities w/o difficulty. Affect pleasant.   ASSESSMENT & PLAN:  1. Chronic systolic=>diastolic CHF: Prior ischemic cardiomyopathy, improved after PCI in 2017.  Echo 03/2018 with EF back down to 35-40% and moderate-severe RV dysfunction. This was in the setting of atrial fibrillation with RVR. EF 40% on TEE 04/2018.  Echo was in 3/20, showing EF up to 50%.  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  Echo in 7/21  with EF 25-30%, severe  RV dysfunction.  TEE in 10/21, however, showed EF up to 50%.  He had PCI to LAD in 10/21. Echo in 3/22 showed stable EF 50-55% with normal RV.  NYHA class II-III symptoms.  He has peripheral edema but no JVD. Wt stable since meds cut back. Advised to elevate legs at home and wear compression stockings.  - Continue torsemide 20 mg daily.    - Continue Entresto to 24/26 bid (dose recently reduced given improved EF and bump in SCr) - Continue Coreg 12.5 mg bid (did not tolerate 25 mg dose due to low BP and dizziness)  - Compression stockings for peripheral edema.  2. Atrial fibrillation: As above, concerned for possible tachy-mediated CMP.  Had successful DC-CV on 03/29/18 but then back in atrial fibrillation. Ranolazine added, but failed DCCV again 04/02/18. DCCV 04/19/18 was successful.  AF with RVR at 3/22 admission, amiodarone restarted and he was cardioverted to NSR, but he developed amiodarone lung toxicity.  Amiodarone was stopped.  He has subsequently gone back into rate-controlled atrial fibrillation.  - Currently on ranolazine 500 mg BID - No amiodarone with history of lung toxicity.   - Continue Eliquis 5 mg BID.   - Plan for atrial fibrillation ablation with Dr. Rayann Heman next week  3. CKD: Stage 3.  Reviewed BMP from this week, SCr stable at 1.6  4. CAD: History of DES to RCA in 9/17.  Underwent LHC 04/03/18 with PCI to prox left circ. LHC in 10/21 with PCI to LAD.   - He is on Eliquis so no ASA.   - He is now off Plavix after 6 months (stopped prior to AF ablation).  - Continue statin.  5. DM2: Will need to get him on SGLT2 inhibitor in the future.  6. Aortic stenosis: Mild on 3/22 echo.  7. Mitral regurgitation: Mild on 3/22 echo.  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 He is off amiodarone.  He improved with steroids.   - Continue steroid taper per pulmonary, he is followed with pulmonary.  - Bactrim for PJP prophylaxis   Getting AF ablation next week.  Post procedural f/u scheduled in Afib clinic next month. F/u w/ Dr. Aundra Dubin in 2-3 months.   Lyda Jester, PA-C 10/21/20

## 2020-10-21 NOTE — Patient Instructions (Signed)
No Labs done today.   No medication changes were made. Please continue all current medications as prescribed.  Your physician recommends that you schedule a follow-up appointment in: 3 months  If you have any questions or concerns before your next appointment please send us a message through mychart or call our office at [phone].    TO LEAVE A MESSAGE FOR THE NURSE SELECT OPTION 2, PLEASE LEAVE A MESSAGE INCLUDING: YOUR NAME DATE OF BIRTH CALL BACK NUMBER REASON FOR CALL**this is important as we prioritize the call backs  YOU WILL RECEIVE A CALL BACK THE SAME DAY AS LONG AS YOU CALL BEFORE 4:00 PM   Do the following things EVERYDAY: Weigh yourself in the morning before breakfast. Write it down and keep it in a log. Take your medicines as prescribed Eat low salt foods--Limit salt (sodium) to 2000 mg per day.  Stay as active as you can everyday Limit all fluids for the day to less than 2 liters   At the [facility], you and your health needs are our priority. As part of our continuing mission to provide you with exceptional heart care, we have created designated Provider Care Teams. These Care Teams include your primary Cardiologist (physician) and Advanced Practice Providers (APPs- Physician Assistants and Nurse Practitioners) who all work together to provide you with the care you need, when you need it.   You may see any of the following providers on your designated Care Team at your next follow up: Dr Daniel Bensimhon Dr Dalton McLean Amy Clegg, NP Brittainy Simmons, PA Lauren Kemp, PharmD   Please be sure to bring in all your medications bottles to every appointment.   

## 2020-10-22 ENCOUNTER — Other Ambulatory Visit (HOSPITAL_COMMUNITY): Payer: Medicare Other

## 2020-10-25 NOTE — Pre-Procedure Instructions (Signed)
Instructed patient on the following items: Arrival time 0630 for TEE, ablation to follow Nothing to eat or drink after midnight No meds AM of procedure Responsible person to drive you home and stay with you for 24 hrs  Have you missed any doses of anti-coagulant Eliquis- hasn't missed any doses

## 2020-10-25 NOTE — Anesthesia Preprocedure Evaluation (Addendum)
Anesthesia Evaluation  Patient identified by MRN, date of birth, ID band Patient awake    Reviewed: Allergy & Precautions, NPO status , Patient's Chart, lab work & pertinent test results  Airway Mallampati: III  TM Distance: >3 FB Neck ROM: Full    Dental no notable dental hx. (+) Teeth Intact, Dental Advisory Given   Pulmonary asthma ,    Pulmonary exam normal breath sounds clear to auscultation       Cardiovascular hypertension, Pt. on medications + CAD, + Cardiac Stents (2019, 2021) and +CHF  Normal cardiovascular exam+ dysrhythmias (eliquis) Atrial Fibrillation + Valvular Problems/Murmurs (mild AS, mild MR) AS and MR  Rhythm:Regular Rate:Normal  Echo 07/2020: 1. Inferior basal hypokinesis . Left ventricular ejection fraction, by  estimation, is 50 to 55%. The left ventricle has low normal function. The  left ventricle demonstrates regional wall motion abnormalities (see  scoring diagram/findings for  description). Left ventricular diastolic parameters are indeterminate.  2. Right ventricular systolic function is normal. The right ventricular  size is normal. There is normal pulmonary artery systolic pressure.  3. Left atrial size was moderately dilated.  4. Right atrial size was moderately dilated.  5. Color flow interrogation of MV not very good due to poor image quality  MR previously described as moderate on TEE 02/2020 . The mitral valve was  not well visualized. Mild mitral valve regurgitation. No evidence of  mitral stenosis. Moderate mitral  annular calcification.  6. Tricuspid valve regurgitation is moderate.  7. The aortic valve is tricuspid. There is moderate calcification of the  aortic valve. There is moderate thickening of the aortic valve. Aortic  valve regurgitation is not visualized. Mild aortic valve stenosis.  8. Aortic dilatation noted. There is mild dilatation of the aortic root,  measuring 40 mm.   9. The inferior vena cava is normal in size with greater than 50%  respiratory variability, suggesting right atrial pressure of 3 mmHg.   Cath 2021: Left Anterior Descending Prox LAD lesion is 40% stenosed. The lesion is calcified. Ultrasound (IVUS) was performed. Cross-sectional area: 8.4 mm. Moderate plaque burden was detected. IVUS has determined that the lesion is calcified. Mid LAD lesion is 80% stenosed. Second Diagonal Branch 2nd Diag lesion is 70% stenosed. Lateral Second Diagonal Branch Lat 2nd Diag lesion is 80% stenosed.     Neuro/Psych negative neurological ROS  negative psych ROS   GI/Hepatic negative GI ROS, Neg liver ROS,   Endo/Other  diabetes, Poorly Controlled, Type 2, Oral Hypoglycemic AgentsObesity BMI 36  Renal/GU CRFRenal diseaseCKD 3, Cr 1.6  negative genitourinary   Musculoskeletal negative musculoskeletal ROS (+)   Abdominal (+) + obese,   Peds  Hematology negative hematology ROS (+)   Anesthesia Other Findings   Reproductive/Obstetrics negative OB ROS                            Anesthesia Physical Anesthesia Plan  ASA: 3  Anesthesia Plan: MAC   Post-op Pain Management:    Induction:   PONV Risk Score and Plan: TIVA, Treatment may vary due to age or medical condition and Propofol infusion  Airway Management Planned: Natural Airway and Mask  Additional Equipment: None  Intra-op Plan:   Post-operative Plan:   Informed Consent: I have reviewed the patients History and Physical, chart, labs and discussed the procedure including the risks, benefits and alternatives for the proposed anesthesia with the patient or authorized representative who has indicated his/her understanding  and acceptance.       Plan Discussed with: CRNA  Anesthesia Plan Comments:        Anesthesia Quick Evaluation

## 2020-10-26 ENCOUNTER — Ambulatory Visit (HOSPITAL_COMMUNITY): Payer: Medicare Other | Admitting: Anesthesiology

## 2020-10-26 ENCOUNTER — Ambulatory Visit (HOSPITAL_COMMUNITY)
Admission: RE | Admit: 2020-10-26 | Discharge: 2020-10-26 | Disposition: A | Discharge status: Home / Self Care | Payer: Medicare Other | Attending: Cardiovascular Disease | Admitting: Cardiovascular Disease

## 2020-10-26 ENCOUNTER — Encounter (HOSPITAL_COMMUNITY): Payer: Self-pay | Admitting: Cardiovascular Disease

## 2020-10-26 ENCOUNTER — Encounter (HOSPITAL_COMMUNITY)
Admission: RE | Disposition: A | Discharge status: Home / Self Care | Payer: Medicare Other | Source: Home / Self Care | Attending: Cardiovascular Disease

## 2020-10-26 ENCOUNTER — Ambulatory Visit (HOSPITAL_BASED_OUTPATIENT_CLINIC_OR_DEPARTMENT_OTHER): Payer: Medicare Other

## 2020-10-26 DIAGNOSIS — Z79899 Other long term (current) drug therapy: Secondary | ICD-10-CM | POA: Insufficient documentation

## 2020-10-26 DIAGNOSIS — E785 Hyperlipidemia, unspecified: Secondary | ICD-10-CM | POA: Insufficient documentation

## 2020-10-26 DIAGNOSIS — Z794 Long term (current) use of insulin: Secondary | ICD-10-CM | POA: Insufficient documentation

## 2020-10-26 DIAGNOSIS — J45909 Unspecified asthma, uncomplicated: Secondary | ICD-10-CM | POA: Insufficient documentation

## 2020-10-26 DIAGNOSIS — N183 Chronic kidney disease, stage 3 unspecified: Secondary | ICD-10-CM | POA: Diagnosis not present

## 2020-10-26 DIAGNOSIS — I255 Ischemic cardiomyopathy: Secondary | ICD-10-CM | POA: Insufficient documentation

## 2020-10-26 DIAGNOSIS — I340 Nonrheumatic mitral (valve) insufficiency: Secondary | ICD-10-CM

## 2020-10-26 DIAGNOSIS — Z7901 Long term (current) use of anticoagulants: Secondary | ICD-10-CM | POA: Insufficient documentation

## 2020-10-26 DIAGNOSIS — I4819 Other persistent atrial fibrillation: Secondary | ICD-10-CM | POA: Diagnosis not present

## 2020-10-26 DIAGNOSIS — I080 Rheumatic disorders of both mitral and aortic valves: Secondary | ICD-10-CM | POA: Diagnosis not present

## 2020-10-26 DIAGNOSIS — Z7984 Long term (current) use of oral hypoglycemic drugs: Secondary | ICD-10-CM | POA: Insufficient documentation

## 2020-10-26 DIAGNOSIS — Z955 Presence of coronary angioplasty implant and graft: Secondary | ICD-10-CM | POA: Diagnosis not present

## 2020-10-26 DIAGNOSIS — I2510 Atherosclerotic heart disease of native coronary artery without angina pectoris: Secondary | ICD-10-CM | POA: Insufficient documentation

## 2020-10-26 DIAGNOSIS — I4892 Unspecified atrial flutter: Secondary | ICD-10-CM | POA: Diagnosis not present

## 2020-10-26 DIAGNOSIS — E1122 Type 2 diabetes mellitus with diabetic chronic kidney disease: Secondary | ICD-10-CM | POA: Diagnosis not present

## 2020-10-26 DIAGNOSIS — E875 Hyperkalemia: Secondary | ICD-10-CM | POA: Diagnosis not present

## 2020-10-26 DIAGNOSIS — I5043 Acute on chronic combined systolic (congestive) and diastolic (congestive) heart failure: Secondary | ICD-10-CM | POA: Diagnosis not present

## 2020-10-26 DIAGNOSIS — I5042 Chronic combined systolic (congestive) and diastolic (congestive) heart failure: Secondary | ICD-10-CM | POA: Insufficient documentation

## 2020-10-26 DIAGNOSIS — I4891 Unspecified atrial fibrillation: Secondary | ICD-10-CM | POA: Diagnosis not present

## 2020-10-26 DIAGNOSIS — E7849 Other hyperlipidemia: Secondary | ICD-10-CM | POA: Diagnosis not present

## 2020-10-26 DIAGNOSIS — I352 Nonrheumatic aortic (valve) stenosis with insufficiency: Secondary | ICD-10-CM

## 2020-10-26 DIAGNOSIS — I130 Hypertensive heart and chronic kidney disease with heart failure and stage 1 through stage 4 chronic kidney disease, or unspecified chronic kidney disease: Secondary | ICD-10-CM | POA: Diagnosis not present

## 2020-10-26 DIAGNOSIS — I110 Hypertensive heart disease with heart failure: Secondary | ICD-10-CM | POA: Diagnosis not present

## 2020-10-26 HISTORY — PX: ATRIAL FIBRILLATION ABLATION: EP1191

## 2020-10-26 HISTORY — PX: TEE WITHOUT CARDIOVERSION: SHX5443

## 2020-10-26 LAB — POCT I-STAT, CHEM 8
BUN: 21 mg/dL (ref 8–23)
Calcium, Ion: 1.21 mmol/L (ref 1.15–1.40)
Chloride: 109 mmol/L (ref 98–111)
Creatinine, Ser: 1.3 mg/dL — ABNORMAL HIGH (ref 0.61–1.24)
Glucose, Bld: 128 mg/dL — ABNORMAL HIGH (ref 70–99)
HCT: 33 % — ABNORMAL LOW (ref 39.0–52.0)
Hemoglobin: 11.2 g/dL — ABNORMAL LOW (ref 13.0–17.0)
Potassium: 3.9 mmol/L (ref 3.5–5.1)
Sodium: 142 mmol/L (ref 135–145)
TCO2: 24 mmol/L (ref 22–32)

## 2020-10-26 LAB — GLUCOSE, CAPILLARY
Glucose-Capillary: 121 mg/dL — ABNORMAL HIGH (ref 70–99)
Glucose-Capillary: 150 mg/dL — ABNORMAL HIGH (ref 70–99)

## 2020-10-26 LAB — POCT ACTIVATED CLOTTING TIME
Activated Clotting Time: 231 seconds
Activated Clotting Time: 237 seconds

## 2020-10-26 SURGERY — ATRIAL FIBRILLATION ABLATION
Anesthesia: General

## 2020-10-26 SURGERY — ECHOCARDIOGRAM, TRANSESOPHAGEAL
Anesthesia: General

## 2020-10-26 MED ORDER — APIXABAN 5 MG PO TABS
5.0000 mg | ORAL_TABLET | Freq: Once | ORAL | Status: AC
  Administered 2020-10-26: 5 mg via ORAL
  Filled 2020-10-26: qty 1

## 2020-10-26 MED ORDER — HEPARIN SODIUM (PORCINE) 1000 UNIT/ML IJ SOLN
INTRAMUSCULAR | Status: AC
  Filled 2020-10-26: qty 1

## 2020-10-26 MED ORDER — PANTOPRAZOLE SODIUM 40 MG PO TBEC: 40.0000 mg | DELAYED_RELEASE_TABLET | Freq: Every day | ORAL | 0 refills | Status: DC

## 2020-10-26 MED ORDER — DEXAMETHASONE SODIUM PHOSPHATE 10 MG/ML IJ SOLN
INTRAMUSCULAR | Status: DC | PRN
  Administered 2020-10-26: 5 mg via INTRAVENOUS

## 2020-10-26 MED ORDER — SUCCINYLCHOLINE CHLORIDE 200 MG/10ML IV SOSY
PREFILLED_SYRINGE | INTRAVENOUS | Status: DC | PRN
  Administered 2020-10-26: 200 mg via INTRAVENOUS

## 2020-10-26 MED ORDER — LIDOCAINE HCL (PF) 2 % IJ SOLN
INTRAMUSCULAR | Status: DC | PRN
  Administered 2020-10-26: 60 mg via INTRADERMAL

## 2020-10-26 MED ORDER — SODIUM CHLORIDE 0.9 % IV SOLN: INTRAVENOUS | Status: DC

## 2020-10-26 MED ORDER — PHENYLEPHRINE HCL-NACL 10-0.9 MG/250ML-% IV SOLN
INTRAVENOUS | Status: DC | PRN
  Administered 2020-10-26: 45 ug/min via INTRAVENOUS
  Administered 2020-10-26: 30 ug/min via INTRAVENOUS
  Administered 2020-10-26: 15 ug/min via INTRAVENOUS

## 2020-10-26 MED ORDER — SODIUM CHLORIDE 0.9% FLUSH: 3.0000 mL | Freq: Two times a day (BID) | INTRAVENOUS | Status: DC

## 2020-10-26 MED ORDER — HYDROCODONE-ACETAMINOPHEN 5-325 MG PO TABS: 1.0000 | ORAL_TABLET | ORAL | Status: DC | PRN

## 2020-10-26 MED ORDER — ROCURONIUM BROMIDE 10 MG/ML (PF) SYRINGE
PREFILLED_SYRINGE | INTRAVENOUS | Status: DC | PRN
  Administered 2020-10-26: 20 mg via INTRAVENOUS
  Administered 2020-10-26: 50 mg via INTRAVENOUS
  Administered 2020-10-26: 20 mg via INTRAVENOUS

## 2020-10-26 MED ORDER — PHENYLEPHRINE 40 MCG/ML (10ML) SYRINGE FOR IV PUSH (FOR BLOOD PRESSURE SUPPORT)
PREFILLED_SYRINGE | INTRAVENOUS | Status: DC | PRN
  Administered 2020-10-26: 120 ug via INTRAVENOUS

## 2020-10-26 MED ORDER — ONDANSETRON HCL 4 MG/2ML IJ SOLN: 4.0000 mg | Freq: Four times a day (QID) | INTRAMUSCULAR | Status: DC | PRN

## 2020-10-26 MED ORDER — SUGAMMADEX SODIUM 200 MG/2ML IV SOLN
INTRAVENOUS | Status: DC | PRN
  Administered 2020-10-26: 300 mg via INTRAVENOUS

## 2020-10-26 MED ORDER — HEPARIN SODIUM (PORCINE) 1000 UNIT/ML IJ SOLN
INTRAMUSCULAR | Status: DC | PRN
  Administered 2020-10-26: 15000 [IU] via INTRAVENOUS
  Administered 2020-10-26: 1000 [IU] via INTRAVENOUS

## 2020-10-26 MED ORDER — HEPARIN (PORCINE) IN NACL 1000-0.9 UT/500ML-% IV SOLN
INTRAVENOUS | Status: DC | PRN
  Administered 2020-10-26: 500 mL

## 2020-10-26 MED ORDER — PROPOFOL 10 MG/ML IV BOLUS
INTRAVENOUS | Status: DC | PRN
  Administered 2020-10-26: 200 mg via INTRAVENOUS

## 2020-10-26 MED ORDER — PROPOFOL 500 MG/50ML IV EMUL
INTRAVENOUS | Status: DC | PRN
  Administered 2020-10-26: 85 ug/kg/min via INTRAVENOUS

## 2020-10-26 MED ORDER — HEPARIN SODIUM (PORCINE) 1000 UNIT/ML IJ SOLN
INTRAMUSCULAR | Status: DC | PRN
  Administered 2020-10-26 (×2): 4000 [IU] via INTRAVENOUS

## 2020-10-26 MED ORDER — PROPOFOL 10 MG/ML IV BOLUS
INTRAVENOUS | Status: DC | PRN
  Administered 2020-10-26: 20 mg via INTRAVENOUS
  Administered 2020-10-26: 25 mg via INTRAVENOUS

## 2020-10-26 MED ORDER — SODIUM CHLORIDE 0.9 % IV SOLN: INTRAVENOUS | Status: DC | PRN

## 2020-10-26 MED ORDER — ONDANSETRON HCL 4 MG/2ML IJ SOLN
INTRAMUSCULAR | Status: DC | PRN
  Administered 2020-10-26: 4 mg via INTRAVENOUS

## 2020-10-26 MED ORDER — PROTAMINE SULFATE 10 MG/ML IV SOLN
INTRAVENOUS | Status: DC | PRN
  Administered 2020-10-26: 40 mg via INTRAVENOUS

## 2020-10-26 SURGICAL SUPPLY — 20 items
BLANKET WARM UNDERBOD FULL ACC (MISCELLANEOUS) ×2 IMPLANT
CATH MAPPNG PENTARAY F 2-6-2MM (CATHETERS) ×1 IMPLANT
CATH SMTCH THERMOCOOL SF DF (CATHETERS) ×2 IMPLANT
CATH SOUNDSTAR ECO 8FR (CATHETERS) ×2 IMPLANT
CATH WEB BI DIR CSDF CRV REPRO (CATHETERS) ×2 IMPLANT
CLOSURE PERCLOSE PROSTYLE (VASCULAR PRODUCTS) ×10 IMPLANT
COVER SWIFTLINK CONNECTOR (BAG) ×2 IMPLANT
MAT PREVALON FULL STRYKER (MISCELLANEOUS) ×2 IMPLANT
NEEDLE BAYLIS TRANSSEPTAL 71CM (NEEDLE) ×2 IMPLANT
PACK EP LATEX FREE (CUSTOM PROCEDURE TRAY) ×2
PACK EP LF (CUSTOM PROCEDURE TRAY) ×1 IMPLANT
PAD PRO RADIOLUCENT 2001M-C (PAD) ×2 IMPLANT
PATCH CARTO3 (PAD) ×2 IMPLANT
PENTARAY F 2-6-2MM (CATHETERS) ×2
SHEATH BAYLIS TORFLEX (SHEATH) ×2 IMPLANT
SHEATH PINNACLE 7F 10CM (SHEATH) ×4 IMPLANT
SHEATH PINNACLE 9F 10CM (SHEATH) ×2 IMPLANT
SHEATH PROBE COVER 6X72 (BAG) ×4 IMPLANT
TUBING SMART ABLATE COOLFLOW (TUBING) ×2 IMPLANT
WIRE AMPLATZ SS-J .035X180CM (WIRE) ×2 IMPLANT

## 2020-10-26 NOTE — Progress Notes (Signed)
Up and walked and tolerated well; right groin stable, no bleeding or hematoma 

## 2020-10-26 NOTE — Anesthesia Procedure Notes (Signed)
Procedure Name: MAC Date/Time: 10/26/2020 8:13 AM Performed by: Colin Benton, CRNA Pre-anesthesia Checklist: Patient identified, Emergency Drugs available, Suction available and Patient being monitored Patient Re-evaluated:Patient Re-evaluated prior to induction Oxygen Delivery Method: Nasal cannula Induction Type: IV induction Airway Equipment and Method: Bite block Placement Confirmation: positive ETCO2 Dental Injury: Teeth and Oropharynx as per pre-operative assessment

## 2020-10-26 NOTE — Anesthesia Preprocedure Evaluation (Signed)
Anesthesia Evaluation  Patient identified by MRN, date of birth, ID band Patient awake    Reviewed: Allergy & Precautions  Airway Mallampati: II  TM Distance: >3 FB     Dental   Pulmonary shortness of breath, asthma , pneumonia,    breath sounds clear to auscultation       Cardiovascular hypertension, + CAD and +CHF   Rhythm:Regular Rate:Normal     Neuro/Psych  Neuromuscular disease    GI/Hepatic negative GI ROS, Neg liver ROS,   Endo/Other  diabetes  Renal/GU Renal disease     Musculoskeletal   Abdominal   Peds  Hematology   Anesthesia Other Findings   Reproductive/Obstetrics                             Anesthesia Physical Anesthesia Plan  ASA: 3  Anesthesia Plan: General   Post-op Pain Management:    Induction: Intravenous  PONV Risk Score and Plan: 3 and Ondansetron, Dexamethasone and Midazolam  Airway Management Planned: Oral ETT  Additional Equipment:   Intra-op Plan:   Post-operative Plan: Possible Post-op intubation/ventilation  Informed Consent: I have reviewed the patients History and Physical, chart, labs and discussed the procedure including the risks, benefits and alternatives for the proposed anesthesia with the patient or authorized representative who has indicated his/her understanding and acceptance.     Dental advisory given  Plan Discussed with: CRNA and Anesthesiologist  Anesthesia Plan Comments:         Anesthesia Quick Evaluation

## 2020-10-26 NOTE — Transfer of Care (Signed)
Immediate Anesthesia Transfer of Care Note  Patient: RODGER GIANGREGORIO  Procedure(s) Performed: ATRIAL FIBRILLATION ABLATION  Patient Location: Cath Lab  Anesthesia Type:General  Level of Consciousness: awake, alert  and oriented  Airway & Oxygen Therapy: Patient Spontanous Breathing and Patient connected to nasal cannula oxygen  Post-op Assessment: Report given to RN and Post -op Vital signs reviewed and stable  Post vital signs: Reviewed and stable  Last Vitals:  Vitals Value Taken Time  BP 120/81 10/26/20 1306  Temp    Pulse 74 10/26/20 1309  Resp 14 10/26/20 1309  SpO2 98 % 10/26/20 1309  Vitals shown include unvalidated device data.  Last Pain:  Vitals:   10/26/20 0850  TempSrc:   PainSc: 0-No pain         Complications: No notable events documented.

## 2020-10-26 NOTE — Anesthesia Postprocedure Evaluation (Signed)
Anesthesia Post Note  Patient: David Huynh  Procedure(s) Performed: ATRIAL FIBRILLATION ABLATION     Patient location during evaluation: PACU Anesthesia Type: General Level of consciousness: awake Pain management: pain level controlled Vital Signs Assessment: post-procedure vital signs reviewed and stable Respiratory status: spontaneous breathing Cardiovascular status: stable Postop Assessment: no apparent nausea or vomiting Anesthetic complications: no   No notable events documented.  Last Vitals:  Vitals:   10/26/20 1403 10/26/20 1410  BP: 106/63 132/78  Pulse: 78 73  Resp: 20 17  Temp:    SpO2: 97% 100%    Last Pain:  Vitals:   10/26/20 1335  TempSrc: Temporal  PainSc: 0-No pain                 Mayu Ronk

## 2020-10-26 NOTE — H&P (Signed)
PCP: [name], MD Primary Cardiologist: Dr Aundra Dubin Primary EP: Dr Renaldo Fiddler is a 80 y.o. male who presents today for routine electrophysiology study and ablation for afib.    He returned 08/10/20 to the hospital with SOB.  He was found to have afib with RVR.  He was restarted on amiodarone.  Amiodarone was discontinued due to concerns for amiodarone toxicity.  He was also treated with steroids for his acute hypoxic respiratory failure. Medical therapy has also been complicated by renal failure. He is making slow improvement.         Past Medical History:  Diagnosis Date   Allergy      seasonal   Asthma      mild, intermittent   Atrial fibrillation with rapid ventricular response ([place]) 03/25/2018   CAD, multiple vessel      Three-vessel disease involving mid RCA 85% (PCI with DES), OM 2 80% in branch and ostD1 ~90%. Only the mid RCA with PCI target.   Carotid bruit 08/05/2010    Bruit noted on left   Chronic combined systolic and diastolic CHF (congestive heart failure) (HCC)     CKD (chronic kidney disease), stage III ([place])     Diabetes mellitus      type 2   Fracture of multiple ribs 02/12/2015   Hemothorax on right 01/23/2015   Hyperlipidemia     Hypertension     Hypocalcemia 02/12/2015   Lactic acidosis 03/25/2018   Liver function study, abnormal 04/26/2011   Multiple rib fractures     Obesity     Persistent atrial fibrillation ([place])     Pneumonia           Past Surgical History:  Procedure Laterality Date   [place] N/A 02/17/2016    Procedure: Left Heart Cath and Coronary Angiography;  Surgeon: Leonie Man, MD;  Location: [place] [facility];  Service: Cardiovascular: mRCA 85% (PCI), branch of OM2 80%,  ostD1 90%; EF 35-45%   CARDIAC CATHETERIZATION N/A 02/17/2016    Procedure: Coronary Stent Intervention;  Surgeon: Leonie Man, MD;  Location: [place] [facility];  Service: Cardiovascular: mRCA PCI : Ronna Polio PREM MR 9.7D53 DES   CARDIOVERSION N/A 03/29/2018    Procedure: CARDIOVERSION;  Surgeon: Larey Dresser, MD;  Location: [facility] ENDOSCOPY;  Service: Cardiovascular;  Laterality: N/A;   CARDIOVERSION N/A 04/02/2018    Procedure: CARDIOVERSION;  Surgeon: Larey Dresser, MD;  Location: [facility] ENDOSCOPY;  Service: Cardiovascular;  Laterality: N/A;   CARDIOVERSION N/A 04/19/2018    Procedure: CARDIOVERSION;  Surgeon: Larey Dresser, MD;  Location: [facility] ENDOSCOPY;  Service: Cardiovascular;  Laterality: N/A;   CARDIOVERSION N/A 08/11/2020    Procedure: CARDIOVERSION;  Surgeon: Larey Dresser, MD;  Location: [facility] ENDOSCOPY;  Service: Cardiovascular;  Laterality: N/A;   CATARACT EXTRACTION   2010    b/l   CORONARY STENT INTERVENTION N/A 04/03/2018    Procedure: CORONARY STENT INTERVENTION;  Surgeon: Troy Sine, MD;  Location: [place] [facility];  Service: Cardiovascular;  Laterality: N/A;   CORONARY STENT INTERVENTION N/A 03/02/2020    Procedure: CORONARY STENT INTERVENTION;  Surgeon: Jettie Booze, MD;  Location: [place] [facility];  Service: Cardiovascular;  Laterality: N/A;   INTRAVASCULAR ULTRASOUND/IVUS N/A 03/02/2020    Procedure: Intravascular Ultrasound/IVUS;  Surgeon: Jettie Booze, MD;  Location: [place] [facility];  Service: Cardiovascular;  Laterality: N/A;   RETINAL  LASER PROCEDURE   1985   RIGHT/LEFT HEART CATH AND CORONARY ANGIOGRAPHY N/A 04/03/2018    Procedure: RIGHT/LEFT HEART CATH AND CORONARY ANGIOGRAPHY;  Surgeon: Larey Dresser, MD;  Location: [place] [facility];  Service: Cardiovascular;  Laterality: N/A;   RIGHT/LEFT HEART CATH AND CORONARY ANGIOGRAPHY N/A 03/02/2020    Procedure: RIGHT/LEFT HEART CATH AND CORONARY ANGIOGRAPHY;  Surgeon: Larey Dresser, MD;  Location: [place] [facility];  Service: Cardiovascular;  Laterality: N/A;   TEE WITHOUT CARDIOVERSION N/A 03/29/2018    Procedure: TRANSESOPHAGEAL ECHOCARDIOGRAM (TEE);  Surgeon: Larey Dresser, MD;   Location: [facility] ENDOSCOPY;  Service: Cardiovascular;  Laterality: N/A;   TEE WITHOUT CARDIOVERSION N/A 04/19/2018    Procedure: TRANSESOPHAGEAL ECHOCARDIOGRAM (TEE);  Surgeon: Larey Dresser, MD;  Location: [facility] ENDOSCOPY;  Service: Cardiovascular;  Laterality: N/A;   TEE WITHOUT CARDIOVERSION N/A 03/02/2020    Procedure: TRANSESOPHAGEAL ECHOCARDIOGRAM (TEE);  Surgeon: Larey Dresser, MD;  Location: [facility] ENDOSCOPY;  Service: Cardiovascular;  Laterality: N/A;   [place] ECHOCARDIOGRAM   02/2016    Mild LVH. Moderately reduced EF of 35-40% with diffuse hypokinesis. Indeterminate diastolic function. Likely elevated LA pressures. Mild aortic stenosis (mean gradient 10 mmHg). Mild MR. Mild LA dilation. Mild to moderate PA hypertension with 39 mmHg   VIDEO ASSISTED THORACOSCOPY (VATS)/THOROCOTOMY Right 01/24/2015    Procedure: VIDEO ASSISTED THORACOSCOPY (VATS) FOR DRAINAGE OF RIGHT HEMOTHORAX;  Surgeon: Gaye Pollack, MD;  Location: [facility] OR;  Service: Thoracic;  Laterality: Right;      ROS- all systems are reviewed and negatives except as per HPI above         Current Outpatient Medications  Medication Sig Dispense Refill   acetaminophen (TYLENOL) 500 MG tablet Take 1,000 mg by mouth every 8 (eight) hours as needed (for pain or headaches).       ADVAIR DISKUS 100-50 MCG/DOSE AEPB INHALE 1 PUFF INTO THE LUNGS TWICE DAILY 60 each 0   carvedilol (COREG) 25 MG tablet Take 1 tablet (25 mg total) by mouth 2 (two) times daily with a meal. 180 tablet 3   cholecalciferol (VITAMIN D) 1000 units tablet Take 1 tablet (1,000 Units total) by mouth daily. 30 tablet 0   clopidogrel (PLAVIX) 75 MG tablet Take 1 tablet (75 mg total) by mouth daily. 90 tablet 3   ELIQUIS 5 MG TABS tablet TAKE 1 TABLET(5 MG) BY MOUTH TWICE DAILY 60 tablet 11   empagliflozin (JARDIANCE) 10 MG TABS tablet Take 1 tablet (10 mg total) by mouth daily before breakfast. 30 tablet 11   ENTRESTO 49-51  MG TAKE 1 TABLET BY MOUTH TWICE DAILY 60 tablet 11   ezetimibe (ZETIA) 10 MG tablet Take 1 tablet (10 mg total) by mouth daily. 90 tablet 3   glipiZIDE (GLUCOTROL) 5 MG tablet Take 1 tablet (5 mg total) by mouth daily before breakfast. 90 tablet 3   glucose blood test strip Use as instructed 100 each 12   icosapent Ethyl (VASCEPA) 1 g capsule Take 2 capsules (2 g total) by mouth 2 (two) times daily. 120 capsule 11   insulin glargine (LANTUS SOLOSTAR) 100 UNIT/ML Solostar Pen Inject 10 Units into the skin at bedtime. 15 mL 0   insulin lispro (HUMALOG) 100 UNIT/ML injection Check BS tid with meals and qhs 0-200 no units, 201-250 2 units, 251-300 4 units, 301-350 6 units, 351-400 8 units, 401-450 10 units, call MD >450 10 mL 0   Insulin Pen  Needle (PEN NEEDLES) 32G X 4 MM MISC Use 1 at bedtime with Lantus.  Dx code: E11.9 100 each 0   metFORMIN (GLUCOPHAGE) 1000 MG tablet Take 1 tablet (1,000 mg total) by mouth daily with breakfast. 90 tablet 3   nitroGLYCERIN (NITROSTAT) 0.4 MG SL tablet Place 1 tablet (0.4 mg total) under the tongue every 5 (five) minutes as needed for chest pain. 30 tablet 0   polyvinyl alcohol (LIQUIFILM TEARS) 1.4 % ophthalmic solution Place 1 drop into both eyes daily as needed for dry eyes (irritation).       predniSONE (DELTASONE) 10 MG tablet TAKE 5 TABLETS X 21 DAYS, 4 TABLETS X 21 DAYS, 3 TABLETS X 21 DAYS, 2 TABLETS X 21 DAYS, 1 TABLET X 21 DAYS (then pick up 24m tabs) 315 tablet 0   predniSONE 5 MG TBEC TAKE 1 TABLET X 10 DAYS, 1/2 TABLET X 10 DAYS, 1/2 TABLET EVERY OTHER DAY FOR 10 DAYS (THEN STOP) 20 tablet 0   ranolazine (RANEXA) 500 MG 12 hr tablet Take 1 tablet (500 mg total) by mouth 2 (two) times daily. 180 tablet 0   sodium polystyrene (KAYEXALATE) 15 GM/60ML suspension Take 60 mLs (15 g total) by mouth daily as needed. 240 mL 0   torsemide (DEMADEX) 20 MG tablet Take 1 tablet (20 mg total) by mouth in the morning. Start taking on 08/22/20 30 tablet 0   atorvastatin  (LIPITOR) 80 MG tablet Take 1 tablet (80 mg total) by mouth daily. 30 tablet 6             Current Facility-Administered Medications  Medication Dose Route Frequency Provider Last Rate Last Admin   insulin starter kit- pen needles (English) 1 kit  1 kit Other Once BMosie Lukes MD          Physical Exam: Vitals:   10/26/20 0721  BP: (!) 151/97  Pulse: (!) 109  Resp: (!) 21  Temp: (!) 97.5 F (36.4 C)  SpO2: 96%      GEN- The patient is chronically ill appearing, alert and oriented x 3 today.   Head- normocephalic, atraumatic Eyes-  Sclera clear, conjunctiva pink Ears- hearing intact Oropharynx- clear Lungs-   normal work of breathing Heart- irregular rate and rhythm GI- soft Extremities- + edema      Wt Readings from Last 3 Encounters:  09/15/20 263 lb (119.3 kg)  09/03/20 263 lb 9.6 oz (119.6 kg)  08/31/20 262 lb 3.2 oz (118.9 kg)       Echo 08/11/20- EF 50-55%, moderate biatrial enlargement   Assessment and Plan:   1. Persistent atrial fibrillation/ atrial flutter The patient has symptomatic, recurrent  atrial fibrillation and atrial flutter. he has failed medical therapy with amiodarone. Chads2vasc score is 6.  he is anticoagulated with eliquis. Therapeutic strategies for afib/ atrial flutter including medicine (tikosyn) and ablation were discussed in detail with the patient today. Our medicine options are limited.  I am not sure that he could do tikosyn unless his creatinine improves.  He has had possible lung reaction to amiodarone.     Risk, benefits, and alternatives to EP study and radiofrequency ablation for afib were again discussed in detail today. These risks include but are not limited to stroke, bleeding, vascular damage, tamponade, perforation, damage to the esophagus, lungs, and other structures, pulmonary vein stenosis, worsening renal function, and death. The patient understands these risk and wishes to proceed. Given chronic lung disease, obesity,  CHF, and CRI, he is at  high risk for the procedure.  He has not done well with afib.  He wishes to proceed.  TEE pending.  If no LAA thrombus, we will plan to proceed.  he reports compliance with [place] without interruption.  Thompson Grayer MD, [place] 10/26/2020 8:05 AM

## 2020-10-26 NOTE — Transfer of Care (Signed)
Immediate Anesthesia Transfer of Care Note  Patient: David Huynh  Procedure(s) Performed: TRANSESOPHAGEAL ECHOCARDIOGRAM (TEE)  Patient Location: Endoscopy Unit  Anesthesia Type:MAC  Level of Consciousness: awake, alert , oriented and patient cooperative  Airway & Oxygen Therapy: Patient Spontanous Breathing and Patient connected to nasal cannula oxygen  Post-op Assessment: Report given to RN and Post -op Vital signs reviewed and stable  Post vital signs: Reviewed and stable  Last Vitals:  Vitals Value Taken Time  BP 100/61 10/26/20 0834  Temp    Pulse 90 10/26/20 0836  Resp 22 10/26/20 0836  SpO2 97 % 10/26/20 0836  Vitals shown include unvalidated device data.  Last Pain:  Vitals:   10/26/20 0721  TempSrc: Temporal  PainSc: 0-No pain         Complications: No notable events documented.

## 2020-10-26 NOTE — Anesthesia Procedure Notes (Signed)
Procedure Name: Intubation Date/Time: 10/26/2020 10:18 AM Performed by: Macie Burows, CRNA Pre-anesthesia Checklist: Patient identified, Emergency Drugs available, Suction available and Patient being monitored Patient Re-evaluated:Patient Re-evaluated prior to induction Oxygen Delivery Method: Circle system utilized Preoxygenation: Pre-oxygenation with 100% oxygen Induction Type: IV induction Ventilation: Oral airway inserted - appropriate to patient size and Two handed mask ventilation required Laryngoscope Size: Glidescope and 4 Grade View: Grade I Tube type: Oral Tube size: 7.5 mm Number of attempts: 1 Airway Equipment and Method: Stylet and Oral airway Placement Confirmation: ETT inserted through vocal cords under direct vision, positive ETCO2 and breath sounds checked- equal and bilateral Secured at: 22 cm Tube secured with: Tape Dental Injury: Teeth and Oropharynx as per pre-operative assessment

## 2020-10-26 NOTE — Anesthesia Postprocedure Evaluation (Signed)
Anesthesia Post Note  Patient: JEFFRY VOGELSANG  Procedure(s) Performed: TRANSESOPHAGEAL ECHOCARDIOGRAM (TEE)     Patient location during evaluation: PACU Anesthesia Type: MAC Level of consciousness: awake and alert Pain management: pain level controlled Vital Signs Assessment: post-procedure vital signs reviewed and stable Respiratory status: spontaneous breathing, nonlabored ventilation and respiratory function stable Cardiovascular status: blood pressure returned to baseline and stable Postop Assessment: no apparent nausea or vomiting Anesthetic complications: no   No notable events documented.  Last Vitals:  Vitals:   10/26/20 0840 10/26/20 0850  BP: 104/62 118/71  Pulse: 90 75  Resp: 19 15  Temp:    SpO2: 94% 96%    Last Pain:  Vitals:   10/26/20 0850  TempSrc:   PainSc: 0-No pain                 Pervis Hocking

## 2020-10-26 NOTE — CV Procedure (Signed)
    Transesophageal Echocardiogram Note  David Huynh [id number] 07/18/1940  Procedure: Transesophageal Echocardiogram Indications: atrial fib,  pre-  A-fib ablation   Procedure Details Consent: Obtained Time Out: Verified patient identification, verified procedure, site/side was marked, verified correct patient position, special equipment/implants available, Radiology Safety Procedures followed,  medications/allergies/relevent history reviewed, required imaging and test results available.  Performed  Medications:  During this procedure the patient is administered a propofol drip - total of 190 mg iv by Gerarda Gunther, CRNA .  sedation.  The patient's heart rate, blood pressure, and oxygen saturation are monitored continuously during the procedure. The period of conscious sedation is 30  minutes, of which I was present face-to-face 100% of this time.  Technically difficult eTEE .  Difficult angles     .   Left Ventrical:  normal  - EF 50-55%  Mitral Valve: mild - mod MR   Aortic Valve: + aortic stenosis ( gradient not able to be obtained) , mild AI   Tricuspid Valve: normal   Pulmonic Valve: trivial PI  Left Atrium/ Left atrial appendage: no thrombi   Atrial septum: intact by color flow , no ASD or PFO  Aorta: no plaque    Complications: No apparent complications Patient did tolerate procedure well.   Vesta Mixer, Montez Hageman., MD, [facility] 10/26/2020, 8:29 AM

## 2020-10-26 NOTE — Progress Notes (Signed)
Echocardiogram 2D Echocardiogram has been performed.  Warren Lacy Geoge Lawrance 10/26/2020, 8:35 AM

## 2020-10-26 NOTE — Interval H&P Note (Signed)
History and Physical Interval Note:  10/26/2020 7:43 AM  David Huynh  has presented today for surgery, with the diagnosis of AFIB.  The various methods of treatment have been discussed with the patient and family. After consideration of risks, benefits and other options for treatment, the patient has consented to  Procedure(s): TRANSESOPHAGEAL ECHOCARDIOGRAM (TEE) (N/A) as a surgical intervention.  The patient's history has been reviewed, patient examined, no change in status, stable for surgery.  I have reviewed the patient's chart and labs.  Questions were answered to the patient's satisfaction.     Kristeen Miss

## 2020-10-26 NOTE — Discharge Instructions (Signed)
Post procedure care instructions No driving for 4 days. No lifting over 5 lbs for 1 week. No vigorous or sexual activity for 1 week. You may return to work/your usual activities on 11/03/20. Keep procedure site clean & dry. If you notice increased pain, swelling, bleeding or pus, call/return!  You may shower after 24 hours, but no soaking in baths/hot tubs/pools for 1 week.    You have an appointment set up with the At[facility] Fibrillation Clinic.  Multiple studies have shown that being followed by a dedicated atrial fibrillation clinic in addition to the standard care you receive from your other physicians improves health. We believe that enrollment in the atrial fibrillation clinic will allow [place] to better care for you.   The phone number to the Atrial Fibrillation Clinic is [phone]. The clinic is staffed Monday through Friday from 8:30am to 5pm.  Parking Directions: The clinic is located in the Heart and Vascular Building connected to [facility]. 1)From [address] turn on to [address] and go to the 3rd entrance  (Heart and Vascular entrance) on the right. 2)Look to the right for Heart &Vascular Parking Garage. 3)A code for the entrance is required, for July is 3342  4)Take the elevators to the 1st floor. Registration is in the room with the glass walls at the end of the hallway.  If you have any trouble parking or locating the clinic, please don't hesitate to call [phone].

## 2020-10-27 ENCOUNTER — Encounter (HOSPITAL_COMMUNITY): Payer: Self-pay | Admitting: Internal Medicine

## 2020-10-28 ENCOUNTER — Encounter (HOSPITAL_COMMUNITY): Payer: Self-pay | Admitting: Cardiovascular Disease

## 2020-11-08 ENCOUNTER — Other Ambulatory Visit: Payer: Self-pay

## 2020-11-08 ENCOUNTER — Encounter: Payer: Self-pay | Admitting: *Deleted

## 2020-11-08 ENCOUNTER — Ambulatory Visit (INDEPENDENT_AMBULATORY_CARE_PROVIDER_SITE_OTHER): Payer: Medicare Other | Admitting: Family Medicine

## 2020-11-08 VITALS — BP 101/69 | HR 97 | Temp 98.0°F | Resp 12 | Ht 72.0 in | Wt 265.0 lb

## 2020-11-08 DIAGNOSIS — I5030 Unspecified diastolic (congestive) heart failure: Secondary | ICD-10-CM | POA: Diagnosis not present

## 2020-11-08 DIAGNOSIS — E785 Hyperlipidemia, unspecified: Secondary | ICD-10-CM | POA: Diagnosis not present

## 2020-11-08 DIAGNOSIS — T462X5A Adverse effect of other antidysrhythmic drugs, initial encounter: Secondary | ICD-10-CM

## 2020-11-08 DIAGNOSIS — I255 Ischemic cardiomyopathy: Secondary | ICD-10-CM

## 2020-11-08 DIAGNOSIS — J984 Other disorders of lung: Secondary | ICD-10-CM

## 2020-11-08 DIAGNOSIS — R739 Hyperglycemia, unspecified: Secondary | ICD-10-CM | POA: Insufficient documentation

## 2020-11-08 DIAGNOSIS — E1142 Type 2 diabetes mellitus with diabetic polyneuropathy: Secondary | ICD-10-CM

## 2020-11-08 DIAGNOSIS — I10 Essential (primary) hypertension: Secondary | ICD-10-CM

## 2020-11-08 DIAGNOSIS — I4891 Unspecified atrial fibrillation: Secondary | ICD-10-CM

## 2020-11-08 LAB — HEMOGLOBIN A1C: Hgb A1c MFr Bld: 10 % — ABNORMAL HIGH (ref 4.6–6.5)

## 2020-11-08 LAB — CBC
HCT: 40.9 % (ref 39.0–52.0)
Hemoglobin: 13.5 g/dL (ref 13.0–17.0)
MCHC: 33 g/dL (ref 30.0–36.0)
MCV: 95.5 fl (ref 78.0–100.0)
Platelets: 215 10*3/uL (ref 150.0–400.0)
RBC: 4.29 Mil/uL (ref 4.22–5.81)
RDW: 17.7 % — ABNORMAL HIGH (ref 11.5–15.5)
WBC: 13 10*3/uL — ABNORMAL HIGH (ref 4.0–10.5)

## 2020-11-08 LAB — LDL CHOLESTEROL, DIRECT: Direct LDL: 229 mg/dL

## 2020-11-08 LAB — TSH: TSH: 1.62 u[IU]/mL (ref 0.35–4.50)

## 2020-11-08 LAB — LIPID PANEL
Cholesterol: 344 mg/dL — ABNORMAL HIGH (ref 0–200)
HDL: 65.8 mg/dL (ref >39.00)
NonHDL: 277.72
Total CHOL/HDL Ratio: 5
Triglycerides: 247 mg/dL — ABNORMAL HIGH (ref 0.0–149.0)
VLDL: 49.4 mg/dL — ABNORMAL HIGH (ref 0.0–40.0)

## 2020-11-08 NOTE — Progress Notes (Signed)
Subjective:    Patient ID: David Huynh, male    DOB: 14-Sep-1940, 80 y.o.   MRN: [id number]  Chief Complaint  Patient presents with   2 month follow up    HPI Patient is in today for follow up on chronic medical concerns incuding hypertension. No poluria or polyddisia. Is following with Dr Royce Macadamia of urology. He is also following closely with cardiology after his recent troubles. He sees his opthamologist in a few weeks. Denies CP/palp/SOB/HA/congestion/fevers/GI or GU c/o. Taking meds as prescribed   Past Medical History:  Diagnosis Date   Allergy    seasonal   Asthma    mild, intermittent   Atrial fibrillation with rapid ventricular response ([place]) 03/25/2018   CAD, multiple vessel    Three-vessel disease involving mid RCA 85% (PCI with DES), OM 2 80% in branch and ostD1 ~90%. Only the mid RCA with PCI target.    Carotid bruit 08/05/2010   Bruit noted on left    Chronic combined systolic and diastolic CHF (congestive heart failure) (HCC)    CKD (chronic kidney disease), stage III ([place])    Diabetes mellitus    type 2   Fracture of multiple ribs 02/12/2015   Hemothorax on right 01/23/2015   Hyperlipidemia    Hypertension    Hypocalcemia 02/12/2015   Lactic acidosis 03/25/2018   Liver function study, abnormal 04/26/2011   Multiple rib fractures    Obesity    Persistent atrial fibrillation ([place])    Pneumonia     Past Surgical History:  Procedure Laterality Date   APPENDECTOMY  1975   ATRIAL FIBRILLATION ABLATION N/A 10/26/2020   Procedure: ATRIAL FIBRILLATION ABLATION;  Surgeon: Thompson Grayer, MD;  Location: [place] [facility];  Service: Cardiovascular;  Laterality: N/A;   CARDIAC CATHETERIZATION N/A 02/17/2016   Procedure: Left Heart Cath and Coronary Angiography;  Surgeon: Leonie Man, MD;  Location: [place] [facility];  Service: Cardiovascular: mRCA 85% (PCI), branch of OM2 80%,  ostD1 90%; EF 35-45%   CARDIAC CATHETERIZATION N/A 02/17/2016   Procedure: Coronary  Stent Intervention;  Surgeon: Leonie Man, MD;  Location: [place] [facility];  Service: Cardiovascular: mRCA PCI : Ronna Polio PREM MR 0.5L97 DES   CARDIOVERSION N/A 03/29/2018   Procedure: CARDIOVERSION;  Surgeon: Larey Dresser, MD;  Location: [facility] ENDOSCOPY;  Service: Cardiovascular;  Laterality: N/A;   CARDIOVERSION N/A 04/02/2018   Procedure: CARDIOVERSION;  Surgeon: Larey Dresser, MD;  Location: [facility] ENDOSCOPY;  Service: Cardiovascular;  Laterality: N/A;   CARDIOVERSION N/A 04/19/2018   Procedure: CARDIOVERSION;  Surgeon: Larey Dresser, MD;  Location: [facility] ENDOSCOPY;  Service: Cardiovascular;  Laterality: N/A;   CARDIOVERSION N/A 08/11/2020   Procedure: CARDIOVERSION;  Surgeon: Larey Dresser, MD;  Location: [facility] ENDOSCOPY;  Service: Cardiovascular;  Laterality: N/A;   CATARACT EXTRACTION  2010   b/l   CORONARY STENT INTERVENTION N/A 04/03/2018   Procedure: CORONARY STENT INTERVENTION;  Surgeon: Troy Sine, MD;  Location: [place] [facility];  Service: Cardiovascular;  Laterality: N/A;   CORONARY STENT INTERVENTION N/A 03/02/2020   Procedure: CORONARY STENT INTERVENTION;  Surgeon: Jettie Booze, MD;  Location: [place] [facility];  Service: Cardiovascular;  Laterality: N/A;   INTRAVASCULAR ULTRASOUND/IVUS N/A 03/02/2020   Procedure: Intravascular Ultrasound/IVUS;  Surgeon: Jettie Booze, MD;  Location: [place] [facility];  Service: Cardiovascular;  Laterality: N/A;   RETINAL LASER PROCEDURE  1985   RIGHT/LEFT HEART CATH AND CORONARY ANGIOGRAPHY N/A 04/03/2018  Procedure: RIGHT/LEFT HEART CATH AND CORONARY ANGIOGRAPHY;  Surgeon: Larey Dresser, MD;  Location: [place] [facility];  Service: Cardiovascular;  Laterality: N/A;   RIGHT/LEFT HEART CATH AND CORONARY ANGIOGRAPHY N/A 03/02/2020   Procedure: RIGHT/LEFT HEART CATH AND CORONARY ANGIOGRAPHY;  Surgeon: Larey Dresser, MD;  Location: [place] [facility];  Service: Cardiovascular;  Laterality: N/A;   TEE WITHOUT  CARDIOVERSION N/A 03/29/2018   Procedure: TRANSESOPHAGEAL ECHOCARDIOGRAM (TEE);  Surgeon: Larey Dresser, MD;  Location: [facility] ENDOSCOPY;  Service: Cardiovascular;  Laterality: N/A;   TEE WITHOUT CARDIOVERSION N/A 04/19/2018   Procedure: TRANSESOPHAGEAL ECHOCARDIOGRAM (TEE);  Surgeon: Larey Dresser, MD;  Location: [facility] ENDOSCOPY;  Service: Cardiovascular;  Laterality: N/A;   TEE WITHOUT CARDIOVERSION N/A 03/02/2020   Procedure: TRANSESOPHAGEAL ECHOCARDIOGRAM (TEE);  Surgeon: Larey Dresser, MD;  Location: [facility] ENDOSCOPY;  Service: Cardiovascular;  Laterality: N/A;   TEE WITHOUT CARDIOVERSION N/A 10/26/2020   Procedure: TRANSESOPHAGEAL ECHOCARDIOGRAM (TEE);  Surgeon: Acie Fredrickson Wonda Cheng, MD;  Location: [place];  Service: Cardiovascular;  Laterality: N/A;   [place] ECHOCARDIOGRAM  02/2016   Mild LVH. Moderately reduced EF of 35-40% with diffuse hypokinesis. Indeterminate diastolic function. Likely elevated LA pressures. Mild aortic stenosis (mean gradient 10 mmHg). Mild MR. Mild LA dilation. Mild to moderate PA hypertension with 39 mmHg   VIDEO ASSISTED THORACOSCOPY (VATS)/THOROCOTOMY Right 01/24/2015   Procedure: VIDEO ASSISTED THORACOSCOPY (VATS) FOR DRAINAGE OF RIGHT HEMOTHORAX;  Surgeon: Gaye Pollack, MD;  Location: [facility] OR;  Service: Thoracic;  Laterality: Right;    Family History  Problem Relation Age of Onset   CAD Neg Hx     Social History   Socioeconomic History   Marital status: Married    Spouse name: Not on file   Number of children: Not on file   Years of education: Not on file   Highest education level: Not on file  Occupational History   Not on file  Tobacco Use   Smoking status: Never   Smokeless tobacco: Never  Vaping Use   Vaping Use: Never used  Substance and Sexual Activity   Alcohol use: Yes    Comment: rare use now   Drug use: No   Sexual activity: Not on file  Other Topics Concern   Not on file  Social History  Narrative   Not on file   Social Determinants of Health   Financial Resource Strain: Not on file  Food Insecurity: Not on file  Transportation Needs: Not on file  Physical Activity: Not on file  Stress: Not on file  Social Connections: Not on file  Intimate Partner Violence: Not on file    Outpatient Medications Prior to Visit  Medication Sig Dispense Refill   acetaminophen (TYLENOL) 500 MG tablet Take 1,000 mg by mouth every 6 (six) hours as needed for moderate pain or headache.     ADVAIR DISKUS 100-50 MCG/DOSE AEPB INHALE 1 PUFF INTO THE LUNGS TWICE DAILY (Patient taking differently: Inhale 1 puff into the lungs 2 (two) times daily.) 60 each 0   carvedilol (COREG) 25 MG tablet Take 12.5 mg by mouth 2 (two) times daily with a meal.     cholecalciferol (VITAMIN D) 1000 units tablet Take 1 tablet (1,000 Units total) by mouth daily. 30 tablet 0   ELIQUIS 5 MG TABS tablet TAKE 1 TABLET(5 MG) BY MOUTH TWICE DAILY 60 tablet 11   empagliflozin (JARDIANCE) 10 MG TABS tablet Take 1 tablet (10 mg total)  by mouth daily before breakfast. 30 tablet 11   ezetimibe (ZETIA) 10 MG tablet Take 1 tablet (10 mg total) by mouth daily. 90 tablet 3   glipiZIDE (GLUCOTROL) 5 MG tablet Take 1 tablet (5 mg total) by mouth daily before breakfast. 90 tablet 3   glucose blood test strip Use as instructed 100 each 12   icosapent Ethyl (VASCEPA) 1 g capsule Take 2 capsules (2 g total) by mouth 2 (two) times daily. 120 capsule 11   insulin glargine (LANTUS SOLOSTAR) 100 UNIT/ML Solostar Pen Inject 10 Units into the skin at bedtime. 15 mL 0   insulin lispro (HUMALOG) 100 UNIT/ML injection Check BS tid with meals and qhs 0-200 no units, 201-250 2 units, 251-300 4 units, 301-350 6 units, 351-400 8 units, 401-450 10 units, call MD >450 (Patient taking differently: Inject 0-10 Units into the skin 3 (three) times daily with meals. Check BS tid with meals and qhs 0-200 no units, 201-250 2 units, 251-300 4 units, 301-350 6  units, 351-400 8 units, 401-450 10 units, call MD >450) 10 mL 0   Insulin Pen Needle (PEN NEEDLES) 32G X 4 MM MISC Use 1 at bedtime with Lantus.  Dx code: E11.9 100 each 0   metFORMIN (GLUCOPHAGE) 1000 MG tablet Take 1 tablet (1,000 mg total) by mouth daily with breakfast. 90 tablet 3   nitroGLYCERIN (NITROSTAT) 0.4 MG SL tablet Place 1 tablet (0.4 mg total) under the tongue every 5 (five) minutes as needed for chest pain. 30 tablet 0   pantoprazole (PROTONIX) 40 MG tablet Take 1 tablet (40 mg total) by mouth daily. 45 tablet 0   polyvinyl alcohol (LIQUIFILM TEARS) 1.4 % ophthalmic solution Place 1 drop into both eyes daily as needed for dry eyes (irritation).     predniSONE (DELTASONE) 10 MG tablet Take 3 tablets (30 mg total) by mouth daily with breakfast for 21 days, THEN 2 tablets (20 mg total) daily with breakfast for 21 days, THEN 1 tablet (10 mg total) daily with breakfast for 21 days, THEN 0.5 tablets (5 mg total) daily with breakfast for 21 days. 20 tablet 0   ranolazine (RANEXA) 500 MG 12 hr tablet Take 1 tablet (500 mg total) by mouth 2 (two) times daily. 180 tablet 0   sacubitril-valsartan (ENTRESTO) 24-26 MG Take 1 tablet by mouth 2 (two) times daily. 60 tablet 5   sodium polystyrene (KAYEXALATE) 15 GM/60ML suspension Take 60 mLs (15 g total) by mouth daily as needed. 240 mL 0   atorvastatin (LIPITOR) 80 MG tablet Take 1 tablet (80 mg total) by mouth daily. 30 tablet 6   torsemide (DEMADEX) 20 MG tablet Take 1 tablet (20 mg total) by mouth in the morning. Start taking on 08/22/20 30 tablet 0   Facility-Administered Medications Prior to Visit  Medication Dose Route Frequency Provider Last Rate Last Admin   insulin starter kit- pen needles (English) 1 kit  1 kit Other Once Bradd Canary, MD        Allergies  Allergen Reactions   Amiodarone Shortness Of Breath    Review of Systems  Constitutional:  Positive for malaise/fatigue. Negative for fever.  HENT:  Negative for congestion.    Eyes:  Negative for blurred vision.  Respiratory:  Negative for shortness of breath.   Cardiovascular:  Positive for leg swelling. Negative for chest pain and palpitations.  Gastrointestinal:  Negative for abdominal pain, blood in stool and nausea.  Genitourinary:  Negative for dysuria and frequency.  Musculoskeletal:  Negative for falls.  Skin:  Negative for rash.  Neurological:  Negative for dizziness, loss of consciousness and headaches.  Endo/Heme/Allergies:  Negative for environmental allergies.  Psychiatric/Behavioral:  Negative for depression. The patient is not nervous/anxious.       Objective:    Physical Exam  BP 101/69 (BP Location: Right Arm, Cuff Size: Large)   Pulse 97   Temp 98 F (36.7 C) (Oral)   Resp 12   Ht 6' (1.829 m)   Wt 265 lb (120.2 kg)   SpO2 99%   BMI 35.94 kg/m  Wt Readings from Last 3 Encounters:  11/08/20 265 lb (120.2 kg)  10/26/20 264 lb (119.7 kg)  10/21/20 268 lb 6.4 oz (121.7 kg)    Diabetic Foot Exam - Simple   No data filed    Lab Results  Component Value Date   WBC 13.0 (H) 11/08/2020   HGB 13.5 11/08/2020   HCT 40.9 11/08/2020   PLT 215.0 11/08/2020   GLUCOSE 128 (H) 10/26/2020   CHOL 344 (H) 11/08/2020   TRIG 247.0 (H) 11/08/2020   HDL 65.80 11/08/2020   LDLDIRECT 229.0 11/08/2020   LDLCALC 59 08/02/2020   ALT 32 09/07/2020   AST 12 09/07/2020   NA 142 10/26/2020   K 3.9 10/26/2020   CL 109 10/26/2020   CREATININE 1.30 (H) 10/26/2020   BUN 21 10/26/2020   CO2 24 10/13/2020   TSH 1.62 11/08/2020   INR 0.98 02/17/2016   HGBA1C 10.0 (H) 11/08/2020   MICROALBUR 17.6 (H) 04/17/2011    Lab Results  Component Value Date   TSH 1.62 11/08/2020   Lab Results  Component Value Date   WBC 13.0 (H) 11/08/2020   HGB 13.5 11/08/2020   HCT 40.9 11/08/2020   MCV 95.5 11/08/2020   PLT 215.0 11/08/2020   Lab Results  Component Value Date   NA 142 10/26/2020   K 3.9 10/26/2020   CO2 24 10/13/2020   GLUCOSE 128 (H)  10/26/2020   BUN 21 10/26/2020   CREATININE 1.30 (H) 10/26/2020   BILITOT 1.1 09/07/2020   ALKPHOS 56 09/07/2020   AST 12 09/07/2020   ALT 32 09/07/2020   PROT 6.4 09/07/2020   ALBUMIN 3.7 09/07/2020   CALCIUM 8.9 10/13/2020   ANIONGAP 10 09/28/2020   EGFR 43 (L) 10/13/2020   GFR 24.97 (L) 09/07/2020   Lab Results  Component Value Date   CHOL 344 (H) 11/08/2020   Lab Results  Component Value Date   HDL 65.80 11/08/2020   Lab Results  Component Value Date   LDLCALC 59 08/02/2020   Lab Results  Component Value Date   TRIG 247.0 (H) 11/08/2020   Lab Results  Component Value Date   CHOLHDL 5 11/08/2020   Lab Results  Component Value Date   HGBA1C 10.0 (H) 11/08/2020       Assessment & Plan:   Problem List Items Addressed This Visit     Hyperlipidemia with target LDL less than 70 (Chronic)    Encourage heart healthy diet such as MIND or DASH diet, increase exercise, avoid trans fats, simple carbohydrates and processed foods, consider a krill or fish or flaxseed oil cap daily.        Relevant Orders   Lipid panel (Completed)   Essential hypertension (Chronic)    Well controlled, no changes to meds. Encouraged heart healthy diet such as the DASH diet and exercise as tolerated.        Relevant Orders  CBC (Completed)   TSH (Completed)   A-fib ([place])    Recently hospitalized and had a good response. No changes       Type 2 diabetes mellitus with diabetic polyneuropathy, without long-term current use of insulin (HCC)    hgba1c unacceptable, minimize simple carbs. Increase exercise as tolerated. Continue current meds, he is now following with endocrinology       Amiodarone pulmonary toxicity    After steroids started he has done much better       Diastolic CHF ([place])    Stable at home and doing well        RESOLVED: Hyperglycemia - Primary    hgba1c acceptable, minimize simple carbs. Increase exercise as tolerated.        Relevant Orders    Hemoglobin A1c (Completed)    I am having David Huynh maintain his acetaminophen, nitroGLYCERIN, cholecalciferol, atorvastatin, glipiZIDE, metFORMIN, Eliquis, icosapent Ethyl, ezetimibe, Advair Diskus, ranolazine, empagliflozin, polyvinyl alcohol, torsemide, glucose blood, insulin lispro, sodium polystyrene, Lantus SoloStar, Pen Needles, sacubitril-valsartan, predniSONE, carvedilol, and pantoprazole. We will continue to administer insulin starter kit- pen needles.  No orders of the defined types were placed in this encounter.    Penni Homans, MD

## 2020-11-08 NOTE — Assessment & Plan Note (Signed)
Well controlled, no changes to meds. Encouraged heart healthy diet such as the DASH diet and exercise as tolerated.  °

## 2020-11-08 NOTE — Assessment & Plan Note (Signed)
After steroids started he has done much better

## 2020-11-08 NOTE — Assessment & Plan Note (Addendum)
hgba1c unacceptable, minimize simple carbs. Increase exercise as tolerated. Continue current meds, he is now following with endocrinology

## 2020-11-08 NOTE — Assessment & Plan Note (Signed)
hgba1c acceptable, minimize simple carbs. Increase exercise as tolerated.  

## 2020-11-08 NOTE — Assessment & Plan Note (Signed)
Encourage heart healthy diet such as MIND or DASH diet, increase exercise, avoid trans fats, simple carbohydrates and processed foods, consider a krill or fish or flaxseed oil cap daily.  °

## 2020-11-08 NOTE — Assessment & Plan Note (Signed)
Stable at home and doing well

## 2020-11-08 NOTE — Patient Instructions (Addendum)
Heart Failure, Diagnosis  Heart failure is a condition in which the heart has trouble pumping blood. This may mean that the heart cannot pump enough blood out to the body or that the heart does not fill up with enough blood. For some people with heart failure, fluid may back up into the lungs. There may also be swelling (edema) in the lower legs. Heart failure is usually a long-term (chronic) condition. It is important for you to take good care of yourself and followthe treatment plan from your health care provider. What are the causes? This condition may be caused by: High blood pressure (hypertension). Hypertension causes the heart muscle to work harder than normal. Coronary artery disease, or CAD. CAD is the buildup of cholesterol and fat (plaque) in the arteries of the heart. Heart attack, also called myocardial infarction. This injures the heart muscle, making it hard for the heart to pump blood. Abnormal heart valves. The valves do not open and close properly, forcing the heart to pump harder to keep the blood flowing. Heart muscle disease, inflammation, or infection (cardiomyopathy or myocarditis). This is damage to the heart muscle. It can increase the risk of heart failure. Lung disease. The heart works harder when the lungs are not healthy. What increases the risk? The risk of heart failure increases as a person ages. This condition is also more likely to develop in people who: Are obese. Are male. Use tobacco or nicotine products. Abuse alcohol or drugs. Have taken medicines that can damage the heart, such as chemotherapy drugs. Have any of these conditions: Diabetes. Abnormal heart rhythms. Thyroid problems. Low blood counts (anemia). Chronic kidney disease. Have a family history of heart failure. What are the signs or symptoms? Symptoms of this condition include: Shortness of breath with activity, such as when climbing stairs. A cough that does not go away. Swelling of the  feet, ankles, legs, or abdomen. Losing or gaining weight for no reason. Trouble breathing when lying flat. Waking from sleep because of the need to sit up and get more air. Rapid heartbeat. Tiredness (fatigue) and loss of energy. Feeling light-headed, dizzy, or close to fainting. Nausea or loss of appetite. Waking up more often during the night to urinate (nocturia). Confusion. How is this diagnosed? This condition is diagnosed based on: Your medical history, symptoms, and a physical exam. Diagnostic tests, which may include: Echocardiogram. Electrocardiogram (ECG). Chest X-ray. Blood tests. Exercise stress test. Cardiac MRI. Cardiac catheterization and angiogram. Radionuclide scans. How is this treated? Treatment for this condition is aimed at managing the symptoms of heart failure. Medicines Treatment may include medicines that: Help lower blood pressure by relaxing (dilating) the blood vessels. These medicines are called ACE inhibitors (angiotensin-converting enzyme), ARBs (angiotensin receptor blockers), or vasodilators. Cause the kidneys to remove salt and water from the blood through urination (diuretics). Improve heart muscle strength and prevent the heart from beating too fast (beta blockers). Increase the force of the heartbeat (digoxin). Lower heart rates. Certain diabetes medicines (SGLT-2 inhibitors) may also be used in treatment. Healthy behavior changes Treatment may also include making healthy lifestyle changes, such as: Reaching and staying at a healthy weight. Not using tobacco or nicotine products. Eating heart-healthy foods. Limiting or avoiding alcohol. Stopping the use of illegal drugs. Being physically active. Participating in a cardiac rehabilitation program, which is a treatment program to improve your health and well-being through exercise training, education, and counseling. Other treatments Other treatments may include: Procedures to open blocked  arteries or repair   damaged valves. Placing a pacemaker to improve heart function (cardiac resynchronization therapy). Placing a device to treat serious abnormal heart rhythms (implantable cardioverter defibrillator, or ICD). Placing a device to improve the pumping ability of the heart (left ventricular assist device, or LVAD). Receiving a healthy heart from a donor (heart transplant). This is done when other treatments have not helped. Follow these instructions at home: Manage other health conditions as told by your health care provider. These may include hypertension, diabetes, thyroid disease, or abnormal heart rhythms. Get ongoing education and support as needed. Learn as much as you can about heart failure. Keep all follow-up visits. This is important. Summary Heart failure is a condition in which the heart has trouble pumping blood. This condition is commonly caused by high blood pressure and other diseases of the heart and lungs. Symptoms of this condition include shortness of breath, tiredness (fatigue), nausea, and swelling of the feet, ankles, legs, or abdomen. Treatments for this condition may include medicines, lifestyle changes, and surgery. Manage other health conditions as told by your health care provider. This information is not intended to replace advice given to you by your health care provider. Make sure you discuss any questions you have with your healthcare provider. Document Revised: 11/22/2019 Document Reviewed: 11/22/2019 Elsevier Patient Education  2022 [employer]  

## 2020-11-08 NOTE — Assessment & Plan Note (Signed)
Recently hospitalized and had a good response. No changes

## 2020-11-09 ENCOUNTER — Ambulatory Visit: Payer: Medicare Other | Admitting: Family Medicine

## 2020-11-10 ENCOUNTER — Other Ambulatory Visit: Payer: Self-pay | Admitting: *Deleted

## 2020-11-10 MED ORDER — ROSUVASTATIN CALCIUM 40 MG PO TABS: 40.0000 mg | ORAL_TABLET | Freq: Every day | ORAL | 3 refills | Status: DC

## 2020-11-19 ENCOUNTER — Ambulatory Visit: Payer: Medicare Other | Admitting: Primary Care

## 2020-11-22 DIAGNOSIS — N390 Urinary tract infection, site not specified: Secondary | ICD-10-CM | POA: Diagnosis not present

## 2020-11-22 DIAGNOSIS — I129 Hypertensive chronic kidney disease with stage 1 through stage 4 chronic kidney disease, or unspecified chronic kidney disease: Secondary | ICD-10-CM | POA: Diagnosis not present

## 2020-11-22 DIAGNOSIS — N179 Acute kidney failure, unspecified: Secondary | ICD-10-CM | POA: Diagnosis not present

## 2020-11-22 DIAGNOSIS — E1122 Type 2 diabetes mellitus with diabetic chronic kidney disease: Secondary | ICD-10-CM | POA: Diagnosis not present

## 2020-11-22 DIAGNOSIS — N1831 Chronic kidney disease, stage 3a: Secondary | ICD-10-CM | POA: Diagnosis not present

## 2020-11-22 DIAGNOSIS — I5022 Chronic systolic (congestive) heart failure: Secondary | ICD-10-CM | POA: Diagnosis not present

## 2020-11-22 DIAGNOSIS — T462X1A Poisoning by other antidysrhythmic drugs, accidental (unintentional), initial encounter: Secondary | ICD-10-CM | POA: Diagnosis not present

## 2020-11-23 ENCOUNTER — Encounter (HOSPITAL_COMMUNITY): Payer: Self-pay | Admitting: Physician Assistant

## 2020-11-23 ENCOUNTER — Other Ambulatory Visit: Payer: Self-pay

## 2020-11-23 ENCOUNTER — Other Ambulatory Visit: Payer: Self-pay | Admitting: Nephrology

## 2020-11-23 ENCOUNTER — Ambulatory Visit (HOSPITAL_COMMUNITY)
Admission: RE | Admit: 2020-11-23 | Discharge: 2020-11-23 | Disposition: A | Discharge status: Home / Self Care | Payer: Medicare Other | Source: Ambulatory Visit | Attending: Physician Assistant | Admitting: Physician Assistant

## 2020-11-23 VITALS — BP 138/82 | HR 121 | Ht 72.0 in | Wt 267.8 lb

## 2020-11-23 DIAGNOSIS — I2510 Atherosclerotic heart disease of native coronary artery without angina pectoris: Secondary | ICD-10-CM | POA: Diagnosis not present

## 2020-11-23 DIAGNOSIS — D6869 Other thrombophilia: Secondary | ICD-10-CM

## 2020-11-23 DIAGNOSIS — I130 Hypertensive heart and chronic kidney disease with heart failure and stage 1 through stage 4 chronic kidney disease, or unspecified chronic kidney disease: Secondary | ICD-10-CM | POA: Insufficient documentation

## 2020-11-23 DIAGNOSIS — Z955 Presence of coronary angioplasty implant and graft: Secondary | ICD-10-CM | POA: Insufficient documentation

## 2020-11-23 DIAGNOSIS — N183 Chronic kidney disease, stage 3 unspecified: Secondary | ICD-10-CM | POA: Insufficient documentation

## 2020-11-23 DIAGNOSIS — Z7984 Long term (current) use of oral hypoglycemic drugs: Secondary | ICD-10-CM | POA: Insufficient documentation

## 2020-11-23 DIAGNOSIS — I5022 Chronic systolic (congestive) heart failure: Secondary | ICD-10-CM | POA: Insufficient documentation

## 2020-11-23 DIAGNOSIS — N179 Acute kidney failure, unspecified: Secondary | ICD-10-CM

## 2020-11-23 DIAGNOSIS — E669 Obesity, unspecified: Secondary | ICD-10-CM | POA: Diagnosis not present

## 2020-11-23 DIAGNOSIS — Z6836 Body mass index (BMI) 36.0-36.9, adult: Secondary | ICD-10-CM | POA: Diagnosis not present

## 2020-11-23 DIAGNOSIS — I4819 Other persistent atrial fibrillation: Secondary | ICD-10-CM

## 2020-11-23 DIAGNOSIS — E785 Hyperlipidemia, unspecified: Secondary | ICD-10-CM | POA: Diagnosis not present

## 2020-11-23 DIAGNOSIS — Z794 Long term (current) use of insulin: Secondary | ICD-10-CM | POA: Diagnosis not present

## 2020-11-23 DIAGNOSIS — Z7901 Long term (current) use of anticoagulants: Secondary | ICD-10-CM | POA: Diagnosis not present

## 2020-11-23 DIAGNOSIS — I081 Rheumatic disorders of both mitral and tricuspid valves: Secondary | ICD-10-CM | POA: Insufficient documentation

## 2020-11-23 DIAGNOSIS — E1122 Type 2 diabetes mellitus with diabetic chronic kidney disease: Secondary | ICD-10-CM | POA: Insufficient documentation

## 2020-11-23 DIAGNOSIS — N1831 Chronic kidney disease, stage 3a: Secondary | ICD-10-CM

## 2020-11-23 DIAGNOSIS — I4892 Unspecified atrial flutter: Secondary | ICD-10-CM | POA: Insufficient documentation

## 2020-11-23 NOTE — Progress Notes (Signed)
Primary Care Physician: Bradd Canary, MD Primary Cardiologist: Dr Shirlee Latch Primary Electrophysiologist: Dr Johney Frame Referring Physician: Dr Vladimir Faster is a 80 y.o. male with a history of CAD, chronic systolic CHF, HTN, HLD, DM, CKD 3, asthma, atrial flutter, and atrial fibrillation who presents for follow up in the [facility].  The patient was admitted to the hospital on 08/10/20 with SOB and found to be in afib with RVR. He was started on amiodarone but there was concern about lung toxicity and this was discontinued. Patient is on Eliquis for a CHADS2VASC score of 6. He underwent afib and atrial flutter ablation with Dr Johney Frame on 10/26/20. He has been monitoring his burden on his Apple Watch. He has been in SR mostly but has episodes of afib frequently. He reports he was in SR yesterday. He is in rapid afib today but asymptomatic.   Today, he denies symptoms of palpitations, chest pain, shortness of breath, orthopnea, PND, lower extremity edema, dizziness, presyncope, syncope, snoring, daytime somnolence, bleeding, or neurologic sequela. The patient is tolerating medications without difficulties and is otherwise without complaint today.    Atrial Fibrillation Risk Factors:  he does not have symptoms or diagnosis of sleep apnea. he does not have a history of rheumatic fever.   he has a BMI of Body mass index is 36.32 kg/m.Marland Kitchen Filed Weights   11/23/20 1323  Weight: 121.5 kg    Family History  Problem Relation Age of Onset   CAD Neg Hx      Atrial Fibrillation Management history:  Previous antiarrhythmic drugs: amiodarone Previous cardioversions: 2019 x 3, 08/11/20, 10/26/20 Previous ablations: 10/26/20 CHADS2VASC score: 6 Anticoagulation history: Eliquis   Past Medical History:  Diagnosis Date   Allergy    seasonal   Asthma    mild, intermittent   Atrial fibrillation with rapid ventricular response (HCC) 03/25/2018   CAD, multiple  vessel    Three-vessel disease involving mid RCA 85% (PCI with DES), OM 2 80% in branch and ostD1 ~90%. Only the mid RCA with PCI target.    Carotid bruit 08/05/2010   Bruit noted on left    Chronic combined systolic and diastolic CHF (congestive heart failure) (HCC)    CKD (chronic kidney disease), stage III (HCC)    Diabetes mellitus    type 2   Fracture of multiple ribs 02/12/2015   Hemothorax on right 01/23/2015   Hyperlipidemia    Hypertension    Hypocalcemia 02/12/2015   Lactic acidosis 03/25/2018   Liver function study, abnormal 04/26/2011   Multiple rib fractures    Obesity    Persistent atrial fibrillation (HCC)    Pneumonia    Past Surgical History:  Procedure Laterality Date   APPENDECTOMY  1975   ATRIAL FIBRILLATION ABLATION N/A 10/26/2020   Procedure: ATRIAL FIBRILLATION ABLATION;  Surgeon: Hillis Range, MD;  Location: [facility] INVASIVE CV LAB;  Service: Cardiovascular;  Laterality: N/A;   CARDIAC CATHETERIZATION N/A 02/17/2016   Procedure: Left Heart Cath and Coronary Angiography;  Surgeon: Marykay Lex, MD;  Location: [facility] INVASIVE CV LAB;  Service: Cardiovascular: mRCA 85% (PCI), branch of OM2 80%,  ostD1 90%; EF 35-45%   CARDIAC CATHETERIZATION N/A 02/17/2016   Procedure: Coronary Stent Intervention;  Surgeon: Marykay Lex, MD;  Location: [facility] INVASIVE CV LAB;  Service: Cardiovascular: mRCA PCI : Andre Lefort PREM MR [id number] DES   CARDIOVERSION N/A 03/29/2018   Procedure: CARDIOVERSION;  Surgeon: Laurey Morale, MD;  Location: [place];  Service: Cardiovascular;  Laterality: N/A;   CARDIOVERSION N/A 04/02/2018   Procedure: CARDIOVERSION;  Surgeon: Larey Dresser, MD;  Location: [facility] ENDOSCOPY;  Service: Cardiovascular;  Laterality: N/A;   CARDIOVERSION N/A 04/19/2018   Procedure: CARDIOVERSION;  Surgeon: Larey Dresser, MD;  Location: [facility] ENDOSCOPY;  Service: Cardiovascular;  Laterality: N/A;   CARDIOVERSION N/A 08/11/2020   Procedure: CARDIOVERSION;  Surgeon: Larey Dresser, MD;  Location: [facility] ENDOSCOPY;  Service: Cardiovascular;  Laterality: N/A;   CATARACT EXTRACTION  2010   b/l   CORONARY STENT INTERVENTION N/A 04/03/2018   Procedure: CORONARY STENT INTERVENTION;  Surgeon: Troy Sine, MD;  Location: [place] CV LAB;  Service: Cardiovascular;  Laterality: N/A;   CORONARY STENT INTERVENTION N/A 03/02/2020   Procedure: CORONARY STENT INTERVENTION;  Surgeon: Jettie Booze, MD;  Location: [place] CV LAB;  Service: Cardiovascular;  Laterality: N/A;   INTRAVASCULAR ULTRASOUND/IVUS N/A 03/02/2020   Procedure: Intravascular Ultrasound/IVUS;  Surgeon: Jettie Booze, MD;  Location: [place] CV LAB;  Service: Cardiovascular;  Laterality: N/A;   RETINAL LASER PROCEDURE  1985   RIGHT/LEFT HEART CATH AND CORONARY ANGIOGRAPHY N/A 04/03/2018   Procedure: RIGHT/LEFT HEART CATH AND CORONARY ANGIOGRAPHY;  Surgeon: Larey Dresser, MD;  Location: [place] CV LAB;  Service: Cardiovascular;  Laterality: N/A;   RIGHT/LEFT HEART CATH AND CORONARY ANGIOGRAPHY N/A 03/02/2020   Procedure: RIGHT/LEFT HEART CATH AND CORONARY ANGIOGRAPHY;  Surgeon: Larey Dresser, MD;  Location: [place] CV LAB;  Service: Cardiovascular;  Laterality: N/A;   TEE WITHOUT CARDIOVERSION N/A 03/29/2018   Procedure: TRANSESOPHAGEAL ECHOCARDIOGRAM (TEE);  Surgeon: Larey Dresser, MD;  Location: [facility] ENDOSCOPY;  Service: Cardiovascular;  Laterality: N/A;   TEE WITHOUT CARDIOVERSION N/A 04/19/2018   Procedure: TRANSESOPHAGEAL ECHOCARDIOGRAM (TEE);  Surgeon: Larey Dresser, MD;  Location: [facility] ENDOSCOPY;  Service: Cardiovascular;  Laterality: N/A;   TEE WITHOUT CARDIOVERSION N/A 03/02/2020   Procedure: TRANSESOPHAGEAL ECHOCARDIOGRAM (TEE);  Surgeon: Larey Dresser, MD;  Location: [facility] ENDOSCOPY;  Service: Cardiovascular;  Laterality: N/A;   TEE WITHOUT CARDIOVERSION N/A 10/26/2020   Procedure: TRANSESOPHAGEAL ECHOCARDIOGRAM (TEE);  Surgeon: Acie Fredrickson Wonda Cheng, MD;  Location: [place];  Service: Cardiovascular;  Laterality: N/A;   [place] ECHOCARDIOGRAM  02/2016   Mild LVH. Moderately reduced EF of 35-40% with diffuse hypokinesis. Indeterminate diastolic function. Likely elevated LA pressures. Mild aortic stenosis (mean gradient 10 mmHg). Mild MR. Mild LA dilation. Mild to moderate PA hypertension with 39 mmHg   VIDEO ASSISTED THORACOSCOPY (VATS)/THOROCOTOMY Right 01/24/2015   Procedure: VIDEO ASSISTED THORACOSCOPY (VATS) FOR DRAINAGE OF RIGHT HEMOTHORAX;  Surgeon: Gaye Pollack, MD;  Location: [facility] OR;  Service: Thoracic;  Laterality: Right;    Current Outpatient Medications  Medication Sig Dispense Refill   acetaminophen (TYLENOL) 500 MG tablet Take 1,000 mg by mouth every 6 (six) hours as needed for moderate pain or headache.     ADVAIR DISKUS 100-50 MCG/DOSE AEPB INHALE 1 PUFF INTO THE LUNGS TWICE DAILY 60 each 0   carvedilol (COREG) 25 MG tablet Take 12.5 mg by mouth 2 (two) times daily with a meal.     cholecalciferol (VITAMIN D) 1000 units tablet Take 1 tablet (1,000 Units total) by mouth daily. 30 tablet 0   ELIQUIS 5 MG TABS tablet TAKE 1 TABLET(5 MG) BY MOUTH TWICE DAILY 60 tablet 11   empagliflozin (JARDIANCE) 10 MG TABS tablet Take 1 tablet (10 mg total) by mouth  daily before breakfast. 30 tablet 11   ezetimibe (ZETIA) 10 MG tablet Take 1 tablet (10 mg total) by mouth daily. 90 tablet 3   glipiZIDE (GLUCOTROL) 5 MG tablet Take 1 tablet (5 mg total) by mouth daily before breakfast. 90 tablet 3   glucose blood test strip Use as instructed 100 each 12   icosapent Ethyl (VASCEPA) 1 g capsule Take 2 capsules (2 g total) by mouth 2 (two) times daily. 120 capsule 11   insulin glargine (LANTUS SOLOSTAR) 100 UNIT/ML Solostar Pen Inject 10 Units into the skin at bedtime. 15 mL 0   insulin lispro (HUMALOG) 100 UNIT/ML injection Check BS tid with meals and qhs 0-200 no units, 201-250 2 units, 251-300 4 units, 301-350 6  units, 351-400 8 units, 401-450 10 units, call MD >450 (Patient taking differently: Inject 0-10 Units into the skin 3 (three) times daily with meals. Check BS tid with meals and qhs 0-200 no units, 201-250 2 units, 251-300 4 units, 301-350 6 units, 351-400 8 units, 401-450 10 units, call MD >450) 10 mL 0   Insulin Pen Needle (PEN NEEDLES) 32G X 4 MM MISC Use 1 at bedtime with Lantus.  Dx code: E11.9 100 each 0   metFORMIN (GLUCOPHAGE) 1000 MG tablet Take 1 tablet (1,000 mg total) by mouth daily with breakfast. 90 tablet 3   nitroGLYCERIN (NITROSTAT) 0.4 MG SL tablet Place 1 tablet (0.4 mg total) under the tongue every 5 (five) minutes as needed for chest pain. 30 tablet 0   pantoprazole (PROTONIX) 40 MG tablet Take 1 tablet (40 mg total) by mouth daily. 45 tablet 0   polyvinyl alcohol (LIQUIFILM TEARS) 1.4 % ophthalmic solution Place 1 drop into both eyes daily as needed for dry eyes (irritation).     predniSONE (DELTASONE) 10 MG tablet Take 3 tablets (30 mg total) by mouth daily with breakfast for 21 days, THEN 2 tablets (20 mg total) daily with breakfast for 21 days, THEN 1 tablet (10 mg total) daily with breakfast for 21 days, THEN 0.5 tablets (5 mg total) daily with breakfast for 21 days. 20 tablet 0   ranolazine (RANEXA) 500 MG 12 hr tablet Take 1 tablet (500 mg total) by mouth 2 (two) times daily. 180 tablet 0   rosuvastatin (CRESTOR) 40 MG tablet Take 1 tablet (40 mg total) by mouth daily. 30 tablet 3   sacubitril-valsartan (ENTRESTO) 24-26 MG Take 1 tablet by mouth 2 (two) times daily. 60 tablet 5   sodium polystyrene (KAYEXALATE) 15 GM/60ML suspension Take 60 mLs (15 g total) by mouth daily as needed. 240 mL 0   torsemide (DEMADEX) 20 MG tablet Take 1 tablet (20 mg total) by mouth in the morning. Start taking on 08/22/20 30 tablet 0   Current Facility-Administered Medications  Medication Dose Route Frequency Provider Last Rate Last Admin   insulin starter kit- pen needles (English) 1 kit  1  kit Other Once [name], MD        Allergies  Allergen Reactions   Amiodarone Shortness Of Breath    Social History   Socioeconomic History   Marital status: Married    Spouse name: Not on file   Number of children: Not on file   Years of education: Not on file   Highest education level: Not on file  Occupational History   Not on file  Tobacco Use   Smoking status: Never   Smokeless tobacco: Never  Vaping Use   Vaping Use: Never used  Substance and Sexual Activity   Alcohol use: Yes    Comment: rare use now   Drug use: No   Sexual activity: Not on file  Other Topics Concern   Not on file  Social History Narrative   Not on file   Social Determinants of Health   Financial Resource Strain: Not on file  Food Insecurity: Not on file  Transportation Needs: Not on file  Physical Activity: Not on file  Stress: Not on file  Social Connections: Not on file  Intimate Partner Violence: Not on file     ROS- All systems are reviewed and negative except as per the HPI above.  Physical Exam: Vitals:   11/23/20 1323  BP: 138/82  Pulse: (!) 121  Weight: 121.5 kg  Height: 6' (1.829 m)    GEN- The patient is a well appearing obese elderly male, alert and oriented x 3 today.   Head- normocephalic, atraumatic Eyes-  Sclera clear, conjunctiva pink Ears- hearing intact Oropharynx- clear Neck- supple  Lungs- Clear to ausculation bilaterally, normal work of breathing Heart- irregular rate and rhythm, tachycardia, no murmurs, rubs or gallops  GI- soft, NT, ND, + BS Extremities- no clubbing, cyanosis, or edema MS- no significant deformity or atrophy Skin- no rash or lesion Psych- euthymic mood, full affect Neuro- strength and sensation are intact  Wt Readings from Last 3 Encounters:  11/23/20 121.5 kg  11/08/20 120.2 kg  10/26/20 119.7 kg    EKG today demonstrates  Afib Vent. rate 121 BPM PR interval * ms QRS duration 98 ms QT/QTcB 328/465 ms  Echo  08/11/20 demonstrated   1. Inferior basal hypokinesis . Left ventricular ejection fraction, by  estimation, is 50 to 55%. The left ventricle has low normal function. The  left ventricle demonstrates regional wall motion abnormalities (see  scoring diagram/findings for  description). Left ventricular diastolic parameters are indeterminate.   2. Right ventricular systolic function is normal. The right ventricular  size is normal. There is normal pulmonary artery systolic pressure.   3. Left atrial size was moderately dilated.   4. Right atrial size was moderately dilated.   5. Color flow interrogation of MV not very good due to poor image quality  MR previously described as moderate on TEE 02/2020 . The mitral valve was  not well visualized. Mild mitral valve regurgitation. No evidence of  mitral stenosis. Moderate mitral  annular calcification.   6. Tricuspid valve regurgitation is moderate.   7. The aortic valve is tricuspid. There is moderate calcification of the  aortic valve. There is moderate thickening of the aortic valve. Aortic  valve regurgitation is not visualized. Mild aortic valve stenosis.   8. Aortic dilatation noted. There is mild dilatation of the aortic root,  measuring 40 mm.   9. The inferior vena cava is normal in size with greater than 50%  respiratory variability, suggesting right atrial pressure of 3 mmHg.   Epic records are reviewed at length today  CHA2DS2-VASc Score = 6  The patient's score is based upon: CHF History: Yes HTN History: Yes Diabetes History: Yes Stroke History: No Vascular Disease History: Yes Age Score: 2 Gender Score: 0     ASSESSMENT AND PLAN: 1. Persistent Atrial Fibrillation/atrial flutter The patient's CHA2DS2-VASc score is 6, indicating a 9.7% annual risk of stroke.   S/p afib ablation 10/26/20. Reassured patient that some breakthrough episodes of afib are not uncommon for the first 3 months post ablation.  Patient to monitor on  smart  watch. If he becomes persistent, will plan for DCCV.  Continue Eliquis 5 mg BID with no missed doses for 3 months post ablation.  Continue carvedilol 12.5 mg BID  2. Secondary Hypercoagulable State (ICD10:  D68.69) The patient is at significant risk for stroke/thromboembolism based upon his CHA2DS2-VASc Score of 6.  Continue Apixaban (Eliquis).   3. Obesity Body mass index is 36.32 kg/m. Lifestyle modification was discussed at length including regular exercise and weight reduction.  4. Chronic systolic CHF No signs or symptoms of fluid overload.  5. CAD No anginal symptoms.  6. HTN Stable, no changes today.   Follow up with Dr Aundra Dubin and Dr Rayann Heman as scheduled. Sooner in AF clinic if DCCV is needed.    [facility] [address] [place], [place] [address] [phone] 11/23/2020 1:57 PM

## 2020-11-24 ENCOUNTER — Ambulatory Visit (INDEPENDENT_AMBULATORY_CARE_PROVIDER_SITE_OTHER): Payer: Medicare Other

## 2020-11-24 ENCOUNTER — Encounter: Payer: Self-pay | Admitting: Primary Care

## 2020-11-24 ENCOUNTER — Ambulatory Visit (INDEPENDENT_AMBULATORY_CARE_PROVIDER_SITE_OTHER): Payer: Medicare Other | Admitting: Primary Care

## 2020-11-24 VITALS — BP 128/80 | HR 85 | Ht 72.0 in | Wt 272.2 lb

## 2020-11-24 DIAGNOSIS — T462X5A Adverse effect of other antidysrhythmic drugs, initial encounter: Secondary | ICD-10-CM

## 2020-11-24 DIAGNOSIS — R918 Other nonspecific abnormal finding of lung field: Secondary | ICD-10-CM | POA: Diagnosis not present

## 2020-11-24 DIAGNOSIS — J984 Other disorders of lung: Secondary | ICD-10-CM | POA: Diagnosis not present

## 2020-11-24 DIAGNOSIS — I4819 Other persistent atrial fibrillation: Secondary | ICD-10-CM

## 2020-11-24 DIAGNOSIS — I517 Cardiomegaly: Secondary | ICD-10-CM | POA: Diagnosis not present

## 2020-11-24 NOTE — Patient Instructions (Addendum)
Recommendations: - Start Prednisone 10 mg/day for 3 weeks; then 5 mg/day for 3 weeks; then 5mg  Monday Wednesday Friday x 3 weeks; then stop  -  If recurrence of symptoms, then go back to last successful dose and notify office - You can stop Bactrim BS if still taking this - If you need refill on prednisone at any point let our office know   Orders: CXR today  Follow-up: 3 months with Dr. Sunday

## 2020-11-24 NOTE — Progress Notes (Signed)
$'@Patient'K$  ID: David Huynh, male    DOB: Jan 06, 1941, 80 y.o.   MRN: [id number]  Chief Complaint  Patient presents with   Follow-up    Patient reports has short of breath due to heart but seen Cardio 11/23/20.     Referring provider: [name], MD  HPI: 80 year old male, never smoked.  Significant for hypertension, cardiomyopathy, congestive heart failure, aortic stenosis, amiodarone pulmonary toxicity, mild persistent asthma, type 2 diabetes.  Recent hospitalization for acute decompensation of diastolic heart failure complicated by A. fib/flutter and acute hypoxic respiratory failure.   Hospital course 08/09/2020-08/20/2020 Patient presented to ED with dyspnea.  Upon EMS arrival he was in atrial fib/flutter 100 to 120 bpm.  Patient developed SVT/flutter 220 bpm.  He was placed on amiodarone infusion and underwent cardioversion on 3/30.  Patient with persistent hypoxia of respiratory failure, work-up with CT chest showed patchy bilateral infiltrates, predominantly at lower lobes.  Amiodarone discontinued due to medication induced lung injury and started on systemic steroids.  Continue to have increased oxygen requirements on ambulation up to 10 L.  He will need a slow prednisone taper with follow-up outpatient.  Patient was discharged on supplemental oxygen nasal cannula.  Continue bronchodilator therapy.  08/25/2020 Patient presents today for hospital follow-up hypoxic respiratory failure/amiodarone toxicity. Accompanied by his wife. His breathing has "gotten measurably better".  He is currently on $RemoveBefo'60mg'tCOXsLLsmkj$  prednisone, plan is to taper $Remove'10mg'ezEirkK$  every 21 days. BS was 137 yesterday. He has an endocrinologist and will be seeing her the end of April. He is taking Advair inhaler as needed. He is currently on 5L oxygen. For short periods of time he is able to go without his oxygen at home. He is using an rolling walker to ambulate. He will be getting nursing/physical therapy services at home. He will be  seeing cardiology next week. Plan repeat CXR today to monitor infiltrates.  Denies f/c/s, chest pain or tightness, wheezing, cough.   10/06/20  Plan $Rem'60mg'ZvSh$  prednisone daily from 08/17/20  ->  Wean by $Remov'10mg'SXxfyy$  every 3 weeks.  -  Currently should be on 40 mg/day.  Approximately on May 31 you should go to 30 mg/day for 3 weeks and then 20 mg/day for 3 weeks and then 10 mg/day for 3 weeks and then 5 mg/day for 3 weeks and slow taper after that 2 of  -  If recurrence of symptoms, then go back to last successful dose and prolong taper to over one year.   -- Continue bactrim PJP ppx  -We will hold off on chest x-ray today.   - cal your cardiologist or go to ER for low BP/dizziness  Follow-up - Return in 6-8 weeks to see nurse practitioner to ensure he is having continued improvement   11/24/2020- Interm  Patient presents today for 6-8 week follow-up. He is doing extremly well today. He has no acute respiratory symptoms or complaints. Breathing is stable. He is tolerating prednisone taper well without re-occurrence. He just started on $RemoveBe'10mg'JKydNYBHs$  dose of prednisone taper today. He is newly on insulin to control his blood glucose. He is currently in afib and is closely following with cardiology regarding this. He is maintained on Entresto, Coreg and Eliquis.    Allergies  Allergen Reactions   Amiodarone Shortness Of Breath    Immunization History  Administered Date(s) Administered   Influenza Split 04/26/2011   Influenza, High Dose Seasonal PF 02/12/2015, 03/29/2018, 01/25/2019   PFIZER(Purple Top)SARS-COV-2 Vaccination 06/22/2019, 07/15/2019, 02/20/2020   Pneumococcal Conjugate-13 05/21/2015  Pneumococcal Polysaccharide-23 03/29/2018   Td 05/15/2008    Past Medical History:  Diagnosis Date   Allergy    seasonal   Asthma    mild, intermittent   Atrial fibrillation with rapid ventricular response ([place]) 03/25/2018   CAD, multiple vessel    Three-vessel disease involving mid RCA 85% (PCI with  DES), OM 2 80% in branch and ostD1 ~90%. Only the mid RCA with PCI target.    Carotid bruit 08/05/2010   Bruit noted on left    Chronic combined systolic and diastolic CHF (congestive heart failure) (HCC)    CKD (chronic kidney disease), stage III (HCC)    Diabetes mellitus    type 2   Fracture of multiple ribs 02/12/2015   Hemothorax on right 01/23/2015   Hyperlipidemia    Hypertension    Hypocalcemia 02/12/2015   Lactic acidosis 03/25/2018   Liver function study, abnormal 04/26/2011   Multiple rib fractures    Obesity    Persistent atrial fibrillation (HCC)    Pneumonia     Tobacco History: Social History   Tobacco Use  Smoking Status Never  Smokeless Tobacco Never   Counseling given: Not Answered   Outpatient Medications Prior to Visit  Medication Sig Dispense Refill   acetaminophen (TYLENOL) 500 MG tablet Take 1,000 mg by mouth every 6 (six) hours as needed for moderate pain or headache.     ADVAIR DISKUS 100-50 MCG/DOSE AEPB INHALE 1 PUFF INTO THE LUNGS TWICE DAILY 60 each 0   carvedilol (COREG) 25 MG tablet Take 12.5 mg by mouth 2 (two) times daily with a meal.     cholecalciferol (VITAMIN D) 1000 units tablet Take 1 tablet (1,000 Units total) by mouth daily. 30 tablet 0   ELIQUIS 5 MG TABS tablet TAKE 1 TABLET(5 MG) BY MOUTH TWICE DAILY 60 tablet 11   empagliflozin (JARDIANCE) 10 MG TABS tablet Take 1 tablet (10 mg total) by mouth daily before breakfast. 30 tablet 11   ezetimibe (ZETIA) 10 MG tablet Take 1 tablet (10 mg total) by mouth daily. 90 tablet 3   glipiZIDE (GLUCOTROL) 5 MG tablet Take 1 tablet (5 mg total) by mouth daily before breakfast. 90 tablet 3   glucose blood test strip Use as instructed 100 each 12   icosapent Ethyl (VASCEPA) 1 g capsule Take 2 capsules (2 g total) by mouth 2 (two) times daily. 120 capsule 11   insulin glargine (LANTUS SOLOSTAR) 100 UNIT/ML Solostar Pen Inject 10 Units into the skin at bedtime. 15 mL 0   insulin lispro (HUMALOG) 100  UNIT/ML injection Check BS tid with meals and qhs 0-200 no units, 201-250 2 units, 251-300 4 units, 301-350 6 units, 351-400 8 units, 401-450 10 units, call MD >450 (Patient taking differently: Inject 0-10 Units into the skin 3 (three) times daily with meals. Check BS tid with meals and qhs 0-200 no units, 201-250 2 units, 251-300 4 units, 301-350 6 units, 351-400 8 units, 401-450 10 units, call MD >450) 10 mL 0   Insulin Pen Needle (PEN NEEDLES) 32G X 4 MM MISC Use 1 at bedtime with Lantus.  Dx code: E11.9 100 each 0   metFORMIN (GLUCOPHAGE) 1000 MG tablet Take 1 tablet (1,000 mg total) by mouth daily with breakfast. 90 tablet 3   nitroGLYCERIN (NITROSTAT) 0.4 MG SL tablet Place 1 tablet (0.4 mg total) under the tongue every 5 (five) minutes as needed for chest pain. 30 tablet 0   pantoprazole (PROTONIX) 40 MG tablet Take  1 tablet (40 mg total) by mouth daily. 45 tablet 0   polyvinyl alcohol (LIQUIFILM TEARS) 1.4 % ophthalmic solution Place 1 drop into both eyes daily as needed for dry eyes (irritation).     predniSONE (DELTASONE) 10 MG tablet Take 3 tablets (30 mg total) by mouth daily with breakfast for 21 days, THEN 2 tablets (20 mg total) daily with breakfast for 21 days, THEN 1 tablet (10 mg total) daily with breakfast for 21 days, THEN 0.5 tablets (5 mg total) daily with breakfast for 21 days. 20 tablet 0   ranolazine (RANEXA) 500 MG 12 hr tablet Take 1 tablet (500 mg total) by mouth 2 (two) times daily. 180 tablet 0   rosuvastatin (CRESTOR) 40 MG tablet Take 1 tablet (40 mg total) by mouth daily. 30 tablet 3   sacubitril-valsartan (ENTRESTO) 24-26 MG Take 1 tablet by mouth 2 (two) times daily. 60 tablet 5   sodium polystyrene (KAYEXALATE) 15 GM/60ML suspension Take 60 mLs (15 g total) by mouth daily as needed. 240 mL 0   torsemide (DEMADEX) 20 MG tablet Take 1 tablet (20 mg total) by mouth in the morning. Start taking on 08/22/20 30 tablet 0   Facility-Administered Medications Prior to Visit   Medication Dose Route Frequency Provider Last Rate Last Admin   insulin starter kit- pen needles (English) 1 kit  1 kit Other Once [name], MD         Review of Systems  Review of Systems  Constitutional: Negative.   Respiratory: Negative.  Negative for cough, shortness of breath and wheezing.   Psychiatric/Behavioral: Negative.      Physical Exam  BP 128/80 (BP Location: Left Arm, Patient Position: Sitting, Cuff Size: Normal)   Pulse 85   Ht 6' (1.829 m)   Wt 272 lb 3.2 oz (123.5 kg)   SpO2 97%   BMI 36.92 kg/m  Physical Exam Constitutional:      Appearance: Normal appearance.  HENT:     Head: Normocephalic and atraumatic.  Cardiovascular:     Rate and Rhythm: Normal rate and regular rhythm.  Pulmonary:     Effort: Pulmonary effort is normal.     Breath sounds: Normal breath sounds.  Neurological:     General: No focal deficit present.     Mental Status: He is alert and oriented to person, place, and time. Mental status is at baseline.     Lab Results:  CBC    Component Value Date/Time   WBC 13.0 (H) 11/08/2020 1140   RBC 4.29 11/08/2020 1140   HGB 13.5 11/08/2020 1140   HGB 12.8 (L) 10/13/2020 1044   HCT 40.9 11/08/2020 1140   HCT 37.1 (L) 10/13/2020 1044   PLT 215.0 11/08/2020 1140   PLT 250 10/13/2020 1044   MCV 95.5 11/08/2020 1140   MCV 91 10/13/2020 1044   MCH 31.4 10/13/2020 1044   MCH 31.1 09/24/2020 1403   MCHC 33.0 11/08/2020 1140   RDW 17.7 (H) 11/08/2020 1140   RDW 14.5 10/13/2020 1044   LYMPHSABS 1.5 10/13/2020 1044   MONOABS 0.5 09/07/2020 1454   EOSABS 0.1 10/13/2020 1044   BASOSABS 0.1 10/13/2020 1044    BMET    Component Value Date/Time   NA 142 10/26/2020 0730   NA 135 10/13/2020 1044   K 3.9 10/26/2020 0730   CL 109 10/26/2020 0730   CO2 24 10/13/2020 1044   GLUCOSE 128 (H) 10/26/2020 0730   BUN 21 10/26/2020 0730   BUN  41 (H) 10/13/2020 1044   CREATININE 1.30 (H) 10/26/2020 0730   CREATININE 1.76 (H) 12/25/2016  1409   CALCIUM 8.9 10/13/2020 1044   GFRNONAA 46 (L) 09/28/2020 1225   GFRAA 49 (L) 02/16/2020 1046    BNP    Component Value Date/Time   BNP 360.9 (H) 09/28/2020 1225    ProBNP    Component Value Date/Time   PROBNP 593.0 (H) 12/17/2019 1327    Imaging: DG Chest 2 View  Result Date: 11/26/2020 CLINICAL DATA:  80 year old male with history of pulmonary infiltrates. Amiodarone pulmonary toxicity. EXAM: CHEST - 2 VIEW COMPARISON:  Chest x-ray 08/25/2020. FINDINGS: Persistent areas of interstitial prominence and patchy ill-defined opacities are again noted throughout the mid to lower lungs bilaterally, very similar to the prior study. No new areas of airspace consolidation. No pleural effusions. No pneumothorax. No evidence of pulmonary edema. Heart size is borderline enlarged. Upper mediastinal contours are within normal limits. IMPRESSION: 1. The radiographic appearance the chest is very similar to the prior study. The possibility of chronic interstitial lung disease or organizing pneumonia should be considered. These findings could be better evaluated with follow-up nonemergent high-resolution chest CT if clinically appropriate. Electronically Signed   By: Vinnie Langton M.D.   On: 11/26/2020 14:08     Assessment & Plan:   Amiodarone pulmonary toxicity - Admitted in March 2022 for rapid afib/flutter, placed on amiodarone infusion and underwent cardioversion. He had persistent hypoxia and respiratory failure. CT showed patchy bilateral infiltrates. Amiodarone was discontinued d/t medication induced lung injury - He is doing well today, clinically improved on oral steroids. He has no respiratory complaints. No longer requiring oxygen. Tolerating prednisone taper well without re-occurrence of shortness of breath. He just started on $RemoveBe'10mg'fJhnbgmsh$  dose of prednisone taper today. Ok to stop Bactrim PJP as he is on low dose of steriods now. Repeat CXR showed interstitial prominence and patchy  ill-defined opacities throughout the mid to lower lungs bilaterally, similar in appearance to previous. Recommend getting HRCT in 8 weeks after completing prednisone course.   Recommendations:  - Start Prednisone 10 mg/day for 3 weeks; then 5 mg/day for 3 weeks; then $RemoveBef'5mg'IvDMfVEVrF$  Monday Wednesday Friday x 3 weeks; then stop  -  If recurrence of symptoms, then go back to last successful dose and notify office - You can stop Bactrim BS if still taking this - If you need refill on prednisone at any point let our office know   Orders: CXR today HRCT in 8 weeks after completing prednisone course  Follow-up: 3 months with Dr. Chase Caller   Persistent atrial fibrillation [facility]) - Currently in afib, rate controlled  - Maintained on Entresto, Coreg and Eliquis  - Following closely with cardiology      Martyn Ehrich, NP 11/26/2020

## 2020-11-26 ENCOUNTER — Encounter: Payer: Self-pay | Admitting: Primary Care

## 2020-11-26 NOTE — Assessment & Plan Note (Addendum)
-   Currently in afib, rate controlled  - Maintained on Entresto, Coreg and Eliquis  - Following closely with cardiology

## 2020-11-26 NOTE — Assessment & Plan Note (Addendum)
-   Admitted in March 2022 for rapid afib/flutter, placed on amiodarone infusion and underwent cardioversion. He had persistent hypoxia and respiratory failure. CT showed patchy bilateral infiltrates. Amiodarone was discontinued d/t medication induced lung injury - He is doing well today, clinically improved on oral steroids. He has no respiratory complaints. No longer requiring oxygen. Tolerating prednisone taper well without re-occurrence of shortness of breath. He just started on 10mg  dose of prednisone taper today. Ok to stop Bactrim PJP as he is on low dose of steriods now. Repeat CXR showed interstitial prominence and patchy ill-defined opacities throughout the mid to lower lungs bilaterally, similar in appearance to previous. Recommend getting HRCT in 8 weeks after completing prednisone course.   Recommendations:  - Start Prednisone 10 mg/day for 3 weeks; then 5 mg/day for 3 weeks; then 5mg  Monday Wednesday Friday x 3 weeks; then stop  -  If recurrence of symptoms, then go back to last successful dose and notify office - You can stop Bactrim BS if still taking this - If you need refill on prednisone at any point let our office know   Orders: CXR today HRCT in 8 weeks after completing prednisone course  Follow-up: 3 months with Dr. Tuesday

## 2020-11-30 ENCOUNTER — Encounter: Payer: Self-pay | Admitting: Internal Medicine

## 2020-11-30 ENCOUNTER — Ambulatory Visit (INDEPENDENT_AMBULATORY_CARE_PROVIDER_SITE_OTHER): Payer: Medicare Other | Admitting: Internal Medicine

## 2020-11-30 ENCOUNTER — Other Ambulatory Visit: Payer: Self-pay

## 2020-11-30 VITALS — BP 110/68 | HR 102 | Ht 74.0 in | Wt 270.0 lb

## 2020-11-30 DIAGNOSIS — E1122 Type 2 diabetes mellitus with diabetic chronic kidney disease: Secondary | ICD-10-CM | POA: Diagnosis not present

## 2020-11-30 DIAGNOSIS — N1832 Chronic kidney disease, stage 3b: Secondary | ICD-10-CM

## 2020-11-30 DIAGNOSIS — E1142 Type 2 diabetes mellitus with diabetic polyneuropathy: Secondary | ICD-10-CM | POA: Diagnosis not present

## 2020-11-30 DIAGNOSIS — E1159 Type 2 diabetes mellitus with other circulatory complications: Secondary | ICD-10-CM | POA: Diagnosis not present

## 2020-11-30 MED ORDER — INSULIN LISPRO (1 UNIT DIAL) 100 UNIT/ML (KWIKPEN): PEN_INJECTOR | SUBCUTANEOUS | 6 refills | Status: DC

## 2020-11-30 MED ORDER — PEN NEEDLES 32G X 4 MM MISC: 1.0000 | Freq: Four times a day (QID) | 1 refills | Status: AC

## 2020-11-30 NOTE — Patient Instructions (Signed)
-   STOP  Glipizide for now  - Continue Metformin 1000 , 1 tablet daily  - Continue Jardiance 10 mg for now  - Continue Lantus 10 units daily  - Humalog correctional insulin: Use the scale below to help guide you with each meal   Blood sugar before meal Number of units to inject  Less than 175 0 unit  176 -  220 1 units  221 -  265 2 units  266 -  310 3 units  311 -  355 4 units  356 -  400 5 units       HOW TO TREAT LOW BLOOD SUGARS (Blood sugar LESS THAN 70 MG/DL) Please follow the RULE OF 15 for the treatment of hypoglycemia treatment (when your (blood sugars are less than 70 mg/dL)   STEP 1: Take 15 grams of carbohydrates when your blood sugar is low, which includes:  3-4 GLUCOSE TABS  OR 3-4 OZ OF JUICE OR REGULAR SODA OR ONE TUBE OF GLUCOSE GEL    STEP 2: RECHECK blood sugar in 15 MINUTES STEP 3: If your blood sugar is still low at the 15 minute recheck --> then, go back to STEP 1 and treat AGAIN with another 15 grams of carbohydrates.

## 2020-11-30 NOTE — Progress Notes (Signed)
Name: David Huynh  Age/ Sex: 80 y.o., male   MRN/ DOB: [id number], 1941/01/30     PCP: Bradd Canary, MD   Reason for Endocrinology Evaluation: Type 2 Diabetes Mellitus  Initial Endocrine Consultative Visit: 02/24/2020    PATIENT IDENTIFIER: David Huynh is a 80 y.o. male with a past medical history of T2DM, A. Fib, HTN and dyslipidemia. The patient has followed with Endocrinology clinic since 02/24/2020 for consultative assistance with management of his diabetes.  DIABETIC HISTORY:  David Huynh was diagnosed with T2DM many years ago. He has not been on insulin in the past. His hemoglobin A1c has ranged from 7.3% in 2012, peaking at 9.9% in 2016.   SUBJECTIVE:    Today (11/30/2020): David Huynh is here for a follow up on diabetes care.  He checks his blood sugars 1 times daily. The patient has not had hypoglycemic episodes since the last clinic visit. 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 Fib and CAD    He is on Prednisone since 09/2020 for acute respiratory failure   His SOB has been improving     HOME DIABETES REGIMEN:  Glipizide 5 mg, 1 tablet before Breakfast  Metformin 1000 , 1 tablet daily  Lantus 10 units daily  Jardiance 10 mg daily  Humalog SS   Statin: yes ACE-I/ARB: yes    GLUCOSE LOG: 11/21/2020 Breakfast  Lunch  Dinner  Bed  7/11 98 196 249 182  7/12 104 143 324 211  7/13 132 127 191 176  7/14 101 116 238 231  7/15 120 137 217 294  7/16 98 121 190 204  7/17 124 133 218 190  7/18 100 155 170 187       DIABETIC COMPLICATIONS: Microvascular complications:  CKD III Denies: retinopathy, neuropathy  Last Eye Exam: Completed 03/2019  Macrovascular complications:  CAD ( S/P stent ) /CHF Denies:  CVA, PVD   HISTORY:  Past Medical History:  Past Medical History:  Diagnosis Date   Allergy    seasonal    Asthma    mild, intermittent   Atrial fibrillation with rapid ventricular response (HCC) 03/25/2018   CAD, multiple vessel    Three-vessel disease involving mid RCA 85% (PCI with DES), OM 2 80% in branch and ostD1 ~90%. Only the mid RCA with PCI target.    Carotid bruit 08/05/2010   Bruit noted on left    Chronic combined systolic and diastolic CHF (congestive heart failure) (HCC)    CKD (chronic kidney disease), stage III (HCC)    Diabetes mellitus    type 2   Fracture of multiple ribs 02/12/2015   Hemothorax on right 01/23/2015   Hyperlipidemia    Hypertension    Hypocalcemia 02/12/2015   Lactic acidosis 03/25/2018   Liver function study, abnormal 04/26/2011   Multiple rib fractures    Obesity    Persistent atrial fibrillation (HCC)    Pneumonia    Past Surgical History:  Past Surgical History:  Procedure Laterality Date   APPENDECTOMY  1975   ATRIAL FIBRILLATION ABLATION N/A 10/26/2020   Procedure: ATRIAL FIBRILLATION ABLATION;  Surgeon: Hillis Range, MD;  [facility];  Service: Cardiovascular;  Laterality: N/A;   CARDIAC CATHETERIZATION N/A 02/17/2016   Procedure: Left Heart Cath and Coronary Angiography;  Surgeon: Marykay Lex, MD;  Location: [facility] INVASIVE  CV LAB;  Service: Cardiovascular: mRCA 85% (PCI), branch of OM2 80%,  ostD1 90%; EF 35-45%   CARDIAC CATHETERIZATION N/A 02/17/2016   Procedure: Coronary Stent Intervention;  Surgeon: Marykay Lexavid W Harding, MD;  [facility];  Service: Cardiovascular: mRCA PCI : Andre LefortSTENT PROMUS PREM MR [id number] DES   CARDIOVERSION N/A 03/29/2018   Procedure: CARDIOVERSION;  Surgeon: Laurey MoraleMcLean, Dalton S, MD;  [facility];  Service: Cardiovascular;  Laterality: N/A;   CARDIOVERSION N/A 04/02/2018   Procedure: CARDIOVERSION;  Surgeon: Laurey MoraleMcLean, Dalton S, MD;  [facility];  Service: Cardiovascular;  Laterality: N/A;   CARDIOVERSION N/A 04/19/2018   Procedure: CARDIOVERSION;  Surgeon: Laurey MoraleMcLean, Dalton S, MD;   [facility];  Service: Cardiovascular;  Laterality: N/A;   CARDIOVERSION N/A 08/11/2020   Procedure: CARDIOVERSION;  Surgeon: Laurey MoraleMcLean, Dalton S, MD;  [facility];  Service: Cardiovascular;  Laterality: N/A;   CATARACT EXTRACTION  2010   b/l   CORONARY STENT INTERVENTION N/A 04/03/2018   Procedure: CORONARY STENT INTERVENTION;  Surgeon: Lennette BihariKelly, Thomas A, MD;  [facility];  Service: Cardiovascular;  Laterality: N/A;   CORONARY STENT INTERVENTION N/A 03/02/2020   Procedure: CORONARY STENT INTERVENTION;  Surgeon: Corky CraftsVaranasi, Jayadeep S, MD;  [facility];  Service: Cardiovascular;  Laterality: N/A;   INTRAVASCULAR ULTRASOUND/IVUS N/A 03/02/2020   Procedure: Intravascular Ultrasound/IVUS;  Surgeon: Corky CraftsVaranasi, Jayadeep S, MD;  [facility];  Service: Cardiovascular;  Laterality: N/A;   RETINAL LASER PROCEDURE  1985   RIGHT/LEFT HEART CATH AND CORONARY ANGIOGRAPHY N/A 04/03/2018   Procedure: RIGHT/LEFT HEART CATH AND CORONARY ANGIOGRAPHY;  Surgeon: Laurey MoraleMcLean, Dalton S, MD;  [facility];  Service: Cardiovascular;  Laterality: N/A;   RIGHT/LEFT HEART CATH AND CORONARY ANGIOGRAPHY N/A 03/02/2020   Procedure: RIGHT/LEFT HEART CATH AND CORONARY ANGIOGRAPHY;  Surgeon: Laurey MoraleMcLean, Dalton S, MD;  [facility];  Service: Cardiovascular;  Laterality: N/A;   TEE WITHOUT CARDIOVERSION N/A 03/29/2018   Procedure: TRANSESOPHAGEAL ECHOCARDIOGRAM (TEE);  Surgeon: Laurey MoraleMcLean, Dalton S, MD;  [facility];  Service: Cardiovascular;  Laterality: N/A;   TEE WITHOUT CARDIOVERSION N/A 04/19/2018   Procedure: TRANSESOPHAGEAL ECHOCARDIOGRAM (TEE);  Surgeon: Laurey MoraleMcLean, Dalton S, MD;  [facility];  Service: Cardiovascular;  Laterality: N/A;   TEE WITHOUT CARDIOVERSION N/A 03/02/2020   Procedure: TRANSESOPHAGEAL ECHOCARDIOGRAM (TEE);  Surgeon: Laurey MoraleMcLean, Dalton S, MD;  [facility];  Service: Cardiovascular;  Laterality:  N/A;   TEE WITHOUT CARDIOVERSION N/A 10/26/2020   Procedure: TRANSESOPHAGEAL ECHOCARDIOGRAM (TEE);  Surgeon: Elease HashimotoNahser, Deloris PingPhilip J, MD;  [facility];  Service: Cardiovascular;  Laterality: N/A;   TONSILLECTOMY AND ADENOIDECTOMY  1947   TRANSTHORACIC ECHOCARDIOGRAM  02/2016   Mild LVH. Moderately reduced EF of 35-40% with diffuse hypokinesis. Indeterminate diastolic function. Likely elevated LA pressures. Mild aortic stenosis (mean gradient 10 mmHg). Mild MR. Mild LA dilation. Mild to moderate PA hypertension with 39 mmHg   VIDEO ASSISTED THORACOSCOPY (VATS)/THOROCOTOMY Right 01/24/2015   Procedure: VIDEO ASSISTED THORACOSCOPY (VATS) FOR DRAINAGE OF RIGHT HEMOTHORAX;  Surgeon: Alleen BorneBryan K Bartle, MD;  [facility]: [facility];  Service: Thoracic;  Laterality: Right;   Social History:  reports that he has never smoked. He has never used smokeless tobacco. He reports current alcohol use. He reports that he does not use drugs. Family History:  Family History  Problem Relation Age of Onset   CAD Neg Hx      HOME MEDICATIONS: Allergies as of 11/30/2020  Reactions   Amiodarone Shortness Of Breath        Medication List        Accurate as of November 30, 2020  1:42 PM. If you have any questions, ask your nurse or doctor.          acetaminophen 500 MG tablet Commonly known as: TYLENOL Take 1,000 mg by mouth every 6 (six) hours as needed for moderate pain or headache.   Advair Diskus 100-50 MCG/ACT Aepb Generic drug: fluticasone-salmeterol INHALE 1 PUFF INTO THE LUNGS TWICE DAILY   carvedilol 25 MG tablet Commonly known as: COREG Take 12.5 mg by mouth 2 (two) times daily with a meal.   cholecalciferol 1000 units tablet Commonly known as: VITAMIN D Take 1 tablet (1,000 Units total) by mouth daily.   Eliquis 5 MG Tabs tablet Generic drug: apixaban TAKE 1 TABLET(5 MG) BY MOUTH TWICE DAILY   empagliflozin 10 MG Tabs tablet Commonly known as: Jardiance Take 1 tablet (10 mg total) by  mouth daily before breakfast.   ezetimibe 10 MG tablet Commonly known as: ZETIA Take 1 tablet (10 mg total) by mouth daily.   glipiZIDE 5 MG tablet Commonly known as: GLUCOTROL Take 1 tablet (5 mg total) by mouth daily before breakfast.   glucose blood test strip Use as instructed   icosapent Ethyl 1 g capsule Commonly known as: Vascepa Take 2 capsules (2 g total) by mouth 2 (two) times daily.   insulin lispro 100 UNIT/ML injection Commonly known as: HumaLOG Check BS tid with meals and qhs 0-200 no units, 201-250 2 units, 251-300 4 units, 301-350 6 units, 351-400 8 units, 401-450 10 units, call MD >450 What changed:  how much to take how to take this when to take this   Lantus SoloStar 100 UNIT/ML Solostar Pen Generic drug: insulin glargine Inject 10 Units into the skin at bedtime.   metFORMIN 1000 MG tablet Commonly known as: GLUCOPHAGE Take 1 tablet (1,000 mg total) by mouth daily with breakfast.   nitroGLYCERIN 0.4 MG SL tablet Commonly known as: Nitrostat Place 1 tablet (0.4 mg total) under the tongue every 5 (five) minutes as needed for chest pain.   pantoprazole 40 MG tablet Commonly known as: Protonix Take 1 tablet (40 mg total) by mouth daily.   Pen Needles 32G X 4 MM Misc Use 1 at bedtime with Lantus.  Dx code: E11.9   polyvinyl alcohol 1.4 % ophthalmic solution Commonly known as: LIQUIFILM TEARS Place 1 drop into both eyes daily as needed for dry eyes (irritation).   predniSONE 10 MG tablet Commonly known as: DELTASONE Take 3 tablets (30 mg total) by mouth daily with breakfast for 21 days, THEN 2 tablets (20 mg total) daily with breakfast for 21 days, THEN 1 tablet (10 mg total) daily with breakfast for 21 days, THEN 0.5 tablets (5 mg total) daily with breakfast for 21 days. Start taking on: Oct 12, 2020   ranolazine 500 MG 12 hr tablet Commonly known as: RANEXA Take 1 tablet (500 mg total) by mouth 2 (two) times daily.   rosuvastatin 40 MG  tablet Commonly known as: CRESTOR Take 1 tablet (40 mg total) by mouth daily.   sacubitril-valsartan 24-26 MG Commonly known as: ENTRESTO Take 1 tablet by mouth 2 (two) times daily.   sodium polystyrene 15 GM/60ML suspension Commonly known as: KAYEXALATE Take 60 mLs (15 g total) by mouth daily as needed.   torsemide 20 MG tablet Commonly known as: DEMADEX Take 1 tablet (20  mg total) by mouth in the morning. Start taking on 08/22/20         OBJECTIVE:   Vital Signs: BP 110/68   Pulse (!) 102   Ht 6\' 2"  (1.88 m)   Wt 270 lb (122.5 kg)   SpO2 98%   BMI 34.67 kg/m   Wt Readings from Last 3 Encounters:  11/30/20 270 lb (122.5 kg)  11/24/20 272 lb 3.2 oz (123.5 kg)  11/23/20 267 lb 12.8 oz (121.5 kg)     Exam: General: Pt appears well and is in NAD  Lungs: Clear with good BS bilat with no rales, rhonchi, or wheezes  Heart: Irregular   Extremities: Trace pretibial edema.   Neuro: MS is good with appropriate affect, pt is alert and Ox3    DM foot exam:  02/24/2020    The skin of the feet shows extensive plantar and lateralcallous formation with thickened, curved and discolored nails The pedal pulses are 1+ on right and 1+ on left. The sensation is Decreased  to a screening 5.07, 10 gram monofilament bilaterally     DATA REVIEWED:  Lab Results  Component Value Date   HGBA1C 10.0 (H) 11/08/2020   HGBA1C 6.5 (A) 05/25/2020   HGBA1C 6.8 (A) 02/24/2020   Lab Results  Component Value Date   MICROALBUR 17.6 (H) 04/17/2011   LDLCALC 59 08/02/2020   CREATININE 1.30 (H) 10/26/2020   Lab Results  Component Value Date   MICRALBCREAT 9.1 04/17/2011     Lab Results  Component Value Date   CHOL 344 (H) 11/08/2020   HDL 65.80 11/08/2020   LDLCALC 59 08/02/2020   LDLDIRECT 229.0 11/08/2020   TRIG 247.0 (H) 11/08/2020   CHOLHDL 5 11/08/2020       Results for DRAZEN, ECKERT (MRN [id number]) as of 12/02/2020 06:59  Ref. Range 11/30/2020 13:58  BASIC METABOLIC  PANEL Unknown Rpt (A)  Sodium Latest Ref Range: 135 - 145 mEq/L 142  Potassium Latest Ref Range: 3.5 - 5.1 mEq/L 4.9  Chloride Latest Ref Range: 96 - 112 mEq/L 105  CO2 Latest Ref Range: 19 - 32 mEq/L 26  Glucose Latest Ref Range: 70 - 99 mg/dL 811 (H)  BUN Latest Ref Range: 6 - 23 mg/dL 26 (H)  Creatinine Latest Ref Range: 0.40 - 1.50 mg/dL 0.31 (H)  Calcium Latest Ref Range: 8.4 - 10.5 mg/dL 9.1  GFR Latest Ref Range: >60.00 mL/min 39.57 (L)    ASSESSMENT / PLAN / RECOMMENDATIONS:   1) Type 2 Diabetes Mellitus, Poorly controlled, With CKD III, neuropathic  and macrovascular  complications - Most recent A1c of 10.0 %. Goal A1c < 7.5 %.    - His A1c has increased from 6.5% to 10 % , this is attributed to glucocorticoid use, he has been on insulin through his PCP recently due to severe hyperglycemia. I am hoping this is a short term use as he is slowly reducing prednisone dose  - I will make the following changes and consider switching to oral glycemic agents only in the future if possible   MEDICATIONS: - Stop Glipizide  - Continue Metformin 1000 , 1 tablet daily  - Continue Lantus 10 units daily  - CF : Humalog (BG-130/45) - Jardiance 25 mg daily   EDUCATION / INSTRUCTIONS: BG monitoring instructions: Patient is instructed to check his blood sugars 1 times a day, fasting. Call [facility] if: BG persistently < 70  I reviewed the Rule of 15 for the treatment of  hypoglycemia in detail with the patient. Literature supplied.    2) Diabetic complications:  Eye: Does not  have known diabetic retinopathy.  Neuro/ Feet: Does have known diabetic peripheral neuropathy .  Renal: Patient does  have known baseline CKD. He   is on an ACEI/ARB at present.         F/U in 2 months    Signed electronically by: Lyndle Herrlich, MD  [facility] Endocrinology  [facility] [address] [place] [place], [place] [address] Phone: [phone] FAX:  [phone]   CC: Bradd Canary, MD [address] [place] [address] Phone: [phone]  Fax: [phone]  Return to Endocrinology clinic as below: Future Appointments  Date Time Provider Department Center  12/02/2020  4:15 PM [facility] [place] 2 [facility]  01/24/2021 10:30 AM [facility] 1 [facility] [place]  01/28/2021  2:40 PM Laurey Morale, MD [facility] None  01/31/2021  2:45 PM Hillis Range, MD [facility]  02/08/2021 10:40 AM Bradd Canary, MD [facility]

## 2020-12-01 LAB — BASIC METABOLIC PANEL
BUN: 26 mg/dL — ABNORMAL HIGH (ref 6–23)
CO2: 26 mEq/L (ref 19–32)
Calcium: 9.1 mg/dL (ref 8.4–10.5)
Chloride: 105 mEq/L (ref 96–112)
Creatinine, Ser: 1.64 mg/dL — ABNORMAL HIGH (ref 0.40–1.50)
GFR: 39.57 mL/min — ABNORMAL LOW (ref >60.00)
Glucose, Bld: 208 mg/dL — ABNORMAL HIGH (ref 70–99)
Potassium: 4.9 mEq/L (ref 3.5–5.1)
Sodium: 142 mEq/L (ref 135–145)

## 2020-12-02 ENCOUNTER — Other Ambulatory Visit: Payer: Self-pay

## 2020-12-02 ENCOUNTER — Ambulatory Visit
Admission: RE | Admit: 2020-12-02 | Discharge: 2020-12-02 | Disposition: A | Discharge status: Home / Self Care | Payer: Medicare Other | Source: Ambulatory Visit | Attending: Nephrology | Admitting: Nephrology

## 2020-12-02 DIAGNOSIS — Z20822 Contact with and (suspected) exposure to covid-19: Secondary | ICD-10-CM | POA: Diagnosis not present

## 2020-12-02 DIAGNOSIS — N281 Cyst of kidney, acquired: Secondary | ICD-10-CM | POA: Diagnosis not present

## 2020-12-02 DIAGNOSIS — N1831 Chronic kidney disease, stage 3a: Secondary | ICD-10-CM

## 2020-12-02 DIAGNOSIS — N179 Acute kidney failure, unspecified: Secondary | ICD-10-CM

## 2020-12-02 MED ORDER — EMPAGLIFLOZIN 25 MG PO TABS: 25.0000 mg | ORAL_TABLET | Freq: Every day | ORAL | 2 refills | Status: DC

## 2020-12-08 DIAGNOSIS — E870 Hyperosmolality and hypernatremia: Secondary | ICD-10-CM | POA: Diagnosis not present

## 2020-12-08 DIAGNOSIS — Z23 Encounter for immunization: Secondary | ICD-10-CM | POA: Diagnosis not present

## 2020-12-11 ENCOUNTER — Other Ambulatory Visit: Payer: Self-pay | Admitting: Medical

## 2020-12-18 ENCOUNTER — Other Ambulatory Visit: Payer: Self-pay | Admitting: Internal Medicine

## 2020-12-20 ENCOUNTER — Other Ambulatory Visit: Payer: Self-pay | Admitting: Family Medicine

## 2020-12-20 ENCOUNTER — Encounter: Payer: Self-pay | Admitting: Family Medicine

## 2020-12-20 MED ORDER — ALBUTEROL SULFATE HFA 108 (90 BASE) MCG/ACT IN AERS: 2.0000 | INHALATION_SPRAY | Freq: Four times a day (QID) | RESPIRATORY_TRACT | 1 refills | Status: DC | PRN

## 2020-12-23 ENCOUNTER — Other Ambulatory Visit: Payer: Self-pay | Admitting: Family Medicine

## 2020-12-23 NOTE — Telephone Encounter (Signed)
Looks like the pulmonologist wrote for the 10mg  on 10/12/20.  This is for 5mg .  Should we send to pulmonologist?

## 2020-12-27 ENCOUNTER — Other Ambulatory Visit: Payer: Self-pay

## 2020-12-27 ENCOUNTER — Inpatient Hospital Stay (HOSPITAL_COMMUNITY)
Admission: EM | Admit: 2020-12-27 | Discharge: 2020-12-31 | DRG: 291 | Disposition: A | Discharge status: Home Health | Payer: Medicare Other | Attending: Cardiology | Admitting: Cardiology

## 2020-12-27 ENCOUNTER — Emergency Department (HOSPITAL_COMMUNITY): Payer: Medicare Other

## 2020-12-27 ENCOUNTER — Encounter (HOSPITAL_COMMUNITY): Payer: Self-pay

## 2020-12-27 ENCOUNTER — Encounter (HOSPITAL_COMMUNITY): Payer: Self-pay | Admitting: Emergency Medicine

## 2020-12-27 DIAGNOSIS — E785 Hyperlipidemia, unspecified: Secondary | ICD-10-CM | POA: Diagnosis not present

## 2020-12-27 DIAGNOSIS — I509 Heart failure, unspecified: Secondary | ICD-10-CM | POA: Diagnosis not present

## 2020-12-27 DIAGNOSIS — E1122 Type 2 diabetes mellitus with diabetic chronic kidney disease: Secondary | ICD-10-CM | POA: Diagnosis not present

## 2020-12-27 DIAGNOSIS — E1165 Type 2 diabetes mellitus with hyperglycemia: Secondary | ICD-10-CM | POA: Diagnosis present

## 2020-12-27 DIAGNOSIS — I5043 Acute on chronic combined systolic (congestive) and diastolic (congestive) heart failure: Secondary | ICD-10-CM | POA: Diagnosis present

## 2020-12-27 DIAGNOSIS — I2510 Atherosclerotic heart disease of native coronary artery without angina pectoris: Secondary | ICD-10-CM | POA: Diagnosis present

## 2020-12-27 DIAGNOSIS — I080 Rheumatic disorders of both mitral and aortic valves: Secondary | ICD-10-CM | POA: Diagnosis not present

## 2020-12-27 DIAGNOSIS — I130 Hypertensive heart and chronic kidney disease with heart failure and stage 1 through stage 4 chronic kidney disease, or unspecified chronic kidney disease: Secondary | ICD-10-CM | POA: Diagnosis not present

## 2020-12-27 DIAGNOSIS — N183 Chronic kidney disease, stage 3 unspecified: Secondary | ICD-10-CM | POA: Diagnosis not present

## 2020-12-27 DIAGNOSIS — R0902 Hypoxemia: Secondary | ICD-10-CM | POA: Diagnosis not present

## 2020-12-27 DIAGNOSIS — I4819 Other persistent atrial fibrillation: Secondary | ICD-10-CM | POA: Diagnosis present

## 2020-12-27 DIAGNOSIS — Z7901 Long term (current) use of anticoagulants: Secondary | ICD-10-CM

## 2020-12-27 DIAGNOSIS — I110 Hypertensive heart disease with heart failure: Secondary | ICD-10-CM | POA: Diagnosis not present

## 2020-12-27 DIAGNOSIS — J1282 Pneumonia due to coronavirus disease 2019: Secondary | ICD-10-CM | POA: Diagnosis not present

## 2020-12-27 DIAGNOSIS — Z79899 Other long term (current) drug therapy: Secondary | ICD-10-CM | POA: Diagnosis not present

## 2020-12-27 DIAGNOSIS — Z6834 Body mass index (BMI) 34.0-34.9, adult: Secondary | ICD-10-CM

## 2020-12-27 DIAGNOSIS — Z955 Presence of coronary angioplasty implant and graft: Secondary | ICD-10-CM | POA: Diagnosis not present

## 2020-12-27 DIAGNOSIS — R739 Hyperglycemia, unspecified: Secondary | ICD-10-CM | POA: Diagnosis not present

## 2020-12-27 DIAGNOSIS — U071 COVID-19: Secondary | ICD-10-CM | POA: Diagnosis not present

## 2020-12-27 DIAGNOSIS — R0602 Shortness of breath: Secondary | ICD-10-CM | POA: Diagnosis not present

## 2020-12-27 DIAGNOSIS — J9601 Acute respiratory failure with hypoxia: Secondary | ICD-10-CM | POA: Diagnosis not present

## 2020-12-27 DIAGNOSIS — J302 Other seasonal allergic rhinitis: Secondary | ICD-10-CM | POA: Diagnosis present

## 2020-12-27 DIAGNOSIS — Z7984 Long term (current) use of oral hypoglycemic drugs: Secondary | ICD-10-CM

## 2020-12-27 DIAGNOSIS — I5021 Acute systolic (congestive) heart failure: Secondary | ICD-10-CM | POA: Diagnosis not present

## 2020-12-27 DIAGNOSIS — E669 Obesity, unspecified: Secondary | ICD-10-CM | POA: Diagnosis not present

## 2020-12-27 DIAGNOSIS — N1831 Chronic kidney disease, stage 3a: Secondary | ICD-10-CM | POA: Diagnosis present

## 2020-12-27 DIAGNOSIS — Z794 Long term (current) use of insulin: Secondary | ICD-10-CM | POA: Diagnosis not present

## 2020-12-27 DIAGNOSIS — Z95828 Presence of other vascular implants and grafts: Secondary | ICD-10-CM

## 2020-12-27 DIAGNOSIS — E7849 Other hyperlipidemia: Secondary | ICD-10-CM | POA: Diagnosis not present

## 2020-12-27 DIAGNOSIS — Z9981 Dependence on supplemental oxygen: Secondary | ICD-10-CM | POA: Diagnosis not present

## 2020-12-27 DIAGNOSIS — R0600 Dyspnea, unspecified: Secondary | ICD-10-CM | POA: Diagnosis not present

## 2020-12-27 DIAGNOSIS — J9621 Acute and chronic respiratory failure with hypoxia: Secondary | ICD-10-CM | POA: Diagnosis not present

## 2020-12-27 DIAGNOSIS — I4891 Unspecified atrial fibrillation: Secondary | ICD-10-CM

## 2020-12-27 DIAGNOSIS — E875 Hyperkalemia: Secondary | ICD-10-CM | POA: Diagnosis not present

## 2020-12-27 DIAGNOSIS — I517 Cardiomegaly: Secondary | ICD-10-CM | POA: Diagnosis not present

## 2020-12-27 DIAGNOSIS — I499 Cardiac arrhythmia, unspecified: Secondary | ICD-10-CM | POA: Diagnosis not present

## 2020-12-27 LAB — CBC
HCT: 38.8 % — ABNORMAL LOW (ref 39.0–52.0)
Hemoglobin: 12.2 g/dL — ABNORMAL LOW (ref 13.0–17.0)
MCH: 29.5 pg (ref 26.0–34.0)
MCHC: 31.4 g/dL (ref 30.0–36.0)
MCV: 93.9 fL (ref 80.0–100.0)
Platelets: 506 10*3/uL — ABNORMAL HIGH (ref 150–400)
RBC: 4.13 MIL/uL — ABNORMAL LOW (ref 4.22–5.81)
RDW: 14.1 % (ref 11.5–15.5)
WBC: 11.5 10*3/uL — ABNORMAL HIGH (ref 4.0–10.5)
nRBC: 0 % (ref 0.0–0.2)

## 2020-12-27 LAB — GLUCOSE, CAPILLARY: Glucose-Capillary: 150 mg/dL — ABNORMAL HIGH (ref 70–99)

## 2020-12-27 LAB — BASIC METABOLIC PANEL
Anion gap: 11 (ref 5–15)
BUN: 22 mg/dL (ref 8–23)
CO2: 22 mmol/L (ref 22–32)
Calcium: 8.5 mg/dL — ABNORMAL LOW (ref 8.9–10.3)
Chloride: 102 mmol/L (ref 98–111)
Creatinine, Ser: 1.57 mg/dL — ABNORMAL HIGH (ref 0.61–1.24)
GFR, Estimated: 45 mL/min — ABNORMAL LOW (ref >60)
Glucose, Bld: 263 mg/dL — ABNORMAL HIGH (ref 70–99)
Potassium: 4 mmol/L (ref 3.5–5.1)
Sodium: 135 mmol/L (ref 135–145)

## 2020-12-27 LAB — CBG MONITORING, ED: Glucose-Capillary: 182 mg/dL — ABNORMAL HIGH (ref 70–99)

## 2020-12-27 LAB — BRAIN NATRIURETIC PEPTIDE: B Natriuretic Peptide: 502.6 pg/mL — ABNORMAL HIGH (ref 0.0–100.0)

## 2020-12-27 LAB — MRSA NEXT GEN BY PCR, NASAL: MRSA by PCR Next Gen: NOT DETECTED

## 2020-12-27 LAB — RESP PANEL BY RT-PCR (FLU A&B, COVID) ARPGX2
Influenza A by PCR: NEGATIVE
Influenza B by PCR: NEGATIVE
SARS Coronavirus 2 by RT PCR: POSITIVE — AB

## 2020-12-27 LAB — MAGNESIUM: Magnesium: 2.3 mg/dL (ref 1.7–2.4)

## 2020-12-27 LAB — TROPONIN I (HIGH SENSITIVITY)
Troponin I (High Sensitivity): 12 ng/L (ref <18)
Troponin I (High Sensitivity): 13 ng/L (ref <18)

## 2020-12-27 MED ORDER — INSULIN ASPART 100 UNIT/ML IJ SOLN
0.0000 [IU] | Freq: Three times a day (TID) | INTRAMUSCULAR | Status: DC
  Administered 2020-12-27: 4 [IU] via SUBCUTANEOUS
  Administered 2020-12-28: 7 [IU] via SUBCUTANEOUS
  Administered 2020-12-28: 11 [IU] via SUBCUTANEOUS
  Administered 2020-12-29 (×2): 20 [IU] via SUBCUTANEOUS
  Administered 2020-12-29: 7 [IU] via SUBCUTANEOUS
  Administered 2020-12-30: 15 [IU] via SUBCUTANEOUS
  Administered 2020-12-30: 11 [IU] via SUBCUTANEOUS
  Administered 2020-12-30: 4 [IU] via SUBCUTANEOUS
  Administered 2020-12-31: 7 [IU] via SUBCUTANEOUS
  Administered 2020-12-31: 11 [IU] via SUBCUTANEOUS

## 2020-12-27 MED ORDER — DIGOXIN 0.25 MG/ML IJ SOLN
0.1250 mg | Freq: Once | INTRAMUSCULAR | Status: AC
  Administered 2020-12-27: 0.125 mg via INTRAVENOUS
  Filled 2020-12-27: qty 2

## 2020-12-27 MED ORDER — SACUBITRIL-VALSARTAN 24-26 MG PO TABS
1.0000 | ORAL_TABLET | Freq: Two times a day (BID) | ORAL | Status: DC
  Administered 2020-12-27 – 2020-12-31 (×8): 1 via ORAL
  Filled 2020-12-27 (×8): qty 1

## 2020-12-27 MED ORDER — RANOLAZINE ER 500 MG PO TB12
500.0000 mg | ORAL_TABLET | Freq: Two times a day (BID) | ORAL | Status: DC
  Administered 2020-12-27 – 2020-12-31 (×8): 500 mg via ORAL
  Filled 2020-12-27 (×8): qty 1

## 2020-12-27 MED ORDER — APIXABAN 5 MG PO TABS
5.0000 mg | ORAL_TABLET | Freq: Two times a day (BID) | ORAL | Status: DC
  Administered 2020-12-27: 5 mg via ORAL
  Filled 2020-12-27 (×2): qty 1

## 2020-12-27 MED ORDER — FUROSEMIDE 10 MG/ML IJ SOLN
40.0000 mg | Freq: Two times a day (BID) | INTRAMUSCULAR | Status: DC
Start: 2020-12-27 — End: 2020-12-28
  Administered 2020-12-27: 40 mg via INTRAVENOUS
  Filled 2020-12-27: qty 4

## 2020-12-27 MED ORDER — PREDNISONE 10 MG PO TABS
5.0000 mg | ORAL_TABLET | Freq: Every day | ORAL | Status: DC
  Administered 2020-12-28: 5 mg via ORAL
  Filled 2020-12-27: qty 1

## 2020-12-27 MED ORDER — EZETIMIBE 10 MG PO TABS
10.0000 mg | ORAL_TABLET | Freq: Every day | ORAL | Status: DC
  Administered 2020-12-28 – 2020-12-31 (×4): 10 mg via ORAL
  Filled 2020-12-27 (×4): qty 1

## 2020-12-27 MED ORDER — ICOSAPENT ETHYL 1 G PO CAPS
2.0000 g | ORAL_CAPSULE | Freq: Two times a day (BID) | ORAL | Status: DC
  Administered 2020-12-27 – 2020-12-31 (×8): 2 g via ORAL
  Filled 2020-12-27 (×9): qty 2

## 2020-12-27 MED ORDER — FUROSEMIDE 10 MG/ML IJ SOLN
80.0000 mg | Freq: Two times a day (BID) | INTRAMUSCULAR | Status: DC
  Administered 2020-12-27: 80 mg via INTRAVENOUS
  Filled 2020-12-27 (×2): qty 8

## 2020-12-27 MED ORDER — DIGOXIN 125 MCG PO TABS
0.1250 mg | ORAL_TABLET | Freq: Every day | ORAL | Status: DC
  Administered 2020-12-28 – 2020-12-29 (×2): 0.125 mg via ORAL
  Filled 2020-12-27 (×2): qty 1

## 2020-12-27 MED ORDER — CARVEDILOL 12.5 MG PO TABS
12.5000 mg | ORAL_TABLET | Freq: Two times a day (BID) | ORAL | Status: DC
  Administered 2020-12-28 – 2020-12-31 (×7): 12.5 mg via ORAL
  Filled 2020-12-27 (×7): qty 1

## 2020-12-27 MED ORDER — DILTIAZEM LOAD VIA INFUSION
10.0000 mg | Freq: Once | INTRAVENOUS | Status: AC
  Administered 2020-12-27: 10 mg via INTRAVENOUS
  Filled 2020-12-27: qty 10

## 2020-12-27 MED ORDER — LACTULOSE 10 GM/15ML PO SOLN: 20.0000 g | Freq: Three times a day (TID) | ORAL | Status: DC

## 2020-12-27 MED ORDER — SODIUM CHLORIDE 0.9 % IV SOLN: 250.0000 mL | INTRAVENOUS | Status: DC | PRN

## 2020-12-27 MED ORDER — SODIUM CHLORIDE 0.9 % IV SOLN
INTRAVENOUS | Status: DC | PRN
Start: 2020-12-27 — End: 2020-12-27

## 2020-12-27 MED ORDER — SODIUM CHLORIDE 0.9% FLUSH: 3.0000 mL | INTRAVENOUS | Status: DC | PRN

## 2020-12-27 MED ORDER — SODIUM CHLORIDE 0.9% FLUSH
3.0000 mL | Freq: Two times a day (BID) | INTRAVENOUS | Status: DC
  Administered 2020-12-27 – 2020-12-31 (×5): 3 mL via INTRAVENOUS

## 2020-12-27 MED ORDER — DILTIAZEM HCL 30 MG PO TABS
30.0000 mg | ORAL_TABLET | Freq: Once | ORAL | Status: AC
  Administered 2020-12-27: 30 mg via ORAL
  Filled 2020-12-27: qty 1

## 2020-12-27 MED ORDER — DILTIAZEM HCL 25 MG/5ML IV SOLN
20.0000 mg | Freq: Once | INTRAVENOUS | Status: AC
  Administered 2020-12-27: 20 mg via INTRAVENOUS
  Filled 2020-12-27: qty 5

## 2020-12-27 MED ORDER — EMPAGLIFLOZIN 25 MG PO TABS
25.0000 mg | ORAL_TABLET | Freq: Every day | ORAL | Status: DC
  Administered 2020-12-28 – 2020-12-31 (×4): 25 mg via ORAL
  Filled 2020-12-27 (×4): qty 1

## 2020-12-27 MED ORDER — NITROGLYCERIN 0.4 MG SL SUBL: 0.4000 mg | SUBLINGUAL_TABLET | SUBLINGUAL | Status: DC | PRN

## 2020-12-27 MED ORDER — ROSUVASTATIN CALCIUM 20 MG PO TABS
40.0000 mg | ORAL_TABLET | Freq: Every day | ORAL | Status: DC
  Administered 2020-12-28 – 2020-12-31 (×4): 40 mg via ORAL
  Filled 2020-12-27 (×4): qty 2

## 2020-12-27 MED ORDER — ACETAMINOPHEN 500 MG PO TABS: 1000.0000 mg | ORAL_TABLET | Freq: Four times a day (QID) | ORAL | Status: DC | PRN

## 2020-12-27 MED ORDER — PANTOPRAZOLE SODIUM 40 MG PO TBEC
40.0000 mg | DELAYED_RELEASE_TABLET | Freq: Every day | ORAL | Status: DC
  Administered 2020-12-27 – 2020-12-31 (×5): 40 mg via ORAL
  Filled 2020-12-27 (×5): qty 1

## 2020-12-27 MED ORDER — FUROSEMIDE 10 MG/ML IJ SOLN
40.0000 mg | Freq: Once | INTRAMUSCULAR | Status: AC
  Administered 2020-12-27: 40 mg via INTRAVENOUS
  Filled 2020-12-27: qty 4

## 2020-12-27 MED ORDER — DILTIAZEM HCL-DEXTROSE 125-5 MG/125ML-% IV SOLN (PREMIX)
5.0000 mg/h | INTRAVENOUS | Status: DC
  Administered 2020-12-27: 5 mg/h via INTRAVENOUS
  Filled 2020-12-27: qty 125

## 2020-12-27 MED ORDER — APIXABAN 5 MG PO TABS
5.0000 mg | ORAL_TABLET | Freq: Two times a day (BID) | ORAL | Status: DC
  Administered 2020-12-27 – 2020-12-31 (×8): 5 mg via ORAL
  Filled 2020-12-27 (×7): qty 1

## 2020-12-27 NOTE — ED Triage Notes (Signed)
Pt arrived via [facility] from home for SOB w/exertionx2 days. Per EMS pt is in A-fib w/RVR up to 160. Per EMS, pt 88% on RA, pt has lung damage due to a reaction from amiodarone recently. Pt had a cardiac ablasion 3 wks ago, was out of A-fib for a week then went back into A-fib. Pt is A&Ox4. Pt is in A-fib w/RVR on monitor

## 2020-12-27 NOTE — H&P (Addendum)
Advanced Heart Failure Team H&P Note   PCP:  Bradd Canary, MD  PCP-Cardiology: [name], MD     Reason for Admission: acute on chronic diastolic heart failure and atrial fibrillation w/ RVR.    HPI:    David Huynh is a 80 y.o. male with a history of CAD, chronic systolic CHF, HTN, HLD, DM, CKD 3, and asthma. Patient had EF 35-40% in 9/17, had cath with DES to RCA then repeat echo with EF up to 55-60% by 3/18.    Admitted 03/2018 with SOB x 3 weeks. No cardiology follow up in >1 year. Found to be in atrial fibrillation with RVR. EF down to 35-40%. Poor diuresis with IV lasix, so HF team consulted. He was started on amiodarone drip. PICC line placed with CVP markedly elevated and Coox marginal. Held off on inotropes with concern for making afib worse. Started on lasix drip. He started eliquis and had TEE/DCCV 03/29/18. Was converted to NSR, but shortly after went back into AF. Ranexa added. Attempted repeat DCCV 04/02/18, but was unsuccessful. EP consulted and recommended repeat LHC prior to repeat DCCV. Had [facility] 04/03/18 with PCI to proximal LCx.  He then had TEE-DCCV on 04/19/18 back to NSR. EF on TEE was 40%.  He saw Dr. Johney Frame to discuss ablation and opted to continue amiodarone.    Echo in 3/20 showed EF 50% with diffuse hypokinesis, normal RV, mild AS and mild MR.     He was lost to followup in this office again for over a year.  In 7/21, he was admitted for a couple of days with CHF exacerbation and was treated with IV Lasix.  Echo was done in 7/21, EF back down to 25-30%, global hypokinesis, severely decreased RV systolic function with severe RV enlargement, at least moderate aortic stenosis.  He was in NSR.     TEE was done in 10/21 for closer evaluation of aortic valve, this showed EF actually up to 50% with moderate AS and moderate MR.  LHC/RHC was done, showing normal filling pressures and significant LAD disease.  The patient is now s/p DES to LAD.    He was admitted  on [facility] on 08/10/20 with atrial fibrillation/RVR and CHF.  Amiodarone was started and he was cardioverted to NSR.  Echo in 3/22 showed EF 50-55% with normal RV and mild AS, mild MR.  He developed acute amiodarone lung toxicity with elevated ESR and patchy bilateral infiltrates.  Amiodarone was stopped and prednisone was started.  Oxygen saturation gradually increased over time and he was eventually able to wean off oxygen. He was referred back to EP and underwent Afib ablation 10/26/20 by Dr. Johney Frame. TEE at time of EP study showed LVEF 50-55%, mild-mod MR, mild AI.   He was seen in afib clinic on 11/23/20 and was noted to be back in Afib w/ RVR, 121 bpm but reportedly was asymptomatic. He was continued on Coreg 12.5 mg bid (no dose change) and Eliquis w/ plans to return for f/u and plan DCCV if still in persistent Afib. Pt can feel when he goes in and out of Afib. He reports that he felt that he returned to NSR ~3-4 days after his Afib clinic appt but then reverted back to Afib about 1 week later and has been in Afib ever since.   He now presents to ED w/ a/c CHF. Increased dyspnea, hypoxia and fluid overload.   BNP 506. HS trop 12. CXR w/ stable cardiomegaly and  increased bilateral perihilar and basilar opacities c/w PNA vs edema. WBC 11.5K. AF. In Afib w/ RVR in 130s. BP soft, low 409B systolic Hgb 12, Na 353, K 4.0 SCr 1.57 c/w baseline. 40 mg IV Lasix given in ED. Symptoms improving. Reports full med compliance. No missed doses of Afib in the last 30 days. Denies fever, chills, cough. No sick contacts.   Bedside Echo performed, LVEF ~40-45%.    Review of Systems: [y] = yes, [ ]  = no   General: Weight gain [ ] ; Weight loss [ ] ; Anorexia [ ] ; Fatigue [ ] ; Fever [ ] ; Chills [ ] ; Weakness [ ]   Cardiac: Chest pain/pressure [ ] ; Resting SOB [ Y]; Exertional SOB [Y ]; Orthopnea [ ] ; Pedal Edema [ ] ; Palpitations [ ] ; Syncope [ ] ; Presyncope [ ] ; Paroxysmal nocturnal dyspnea[ ]   Pulmonary: Cough [ ] ;  Wheezing[ ] ; Hemoptysis[ ] ; Sputum [ ] ; Snoring [ ]   GI: Vomiting[ ] ; Dysphagia[ ] ; Melena[ ] ; Hematochezia [ ] ; Heartburn[ ] ; Abdominal pain [ ] ; Constipation [ ] ; Diarrhea [ ] ; BRBPR [ ]   GU: Hematuria[ ] ; Dysuria [ ] ; Nocturia[ ]   Vascular: Pain in legs with walking [ ] ; Pain in feet with lying flat [ ] ; Non-healing sores [ ] ; Stroke [ ] ; TIA [ ] ; Slurred speech [ ] ;  Neuro: Headaches[ ] ; Vertigo[ ] ; Seizures[ ] ; Paresthesias[ ] ;Blurred vision [ ] ; Diplopia [ ] ; Vision changes [ ]   Ortho/Skin: Arthritis [ ] ; Joint pain [ ] ; Muscle pain [ ] ; Joint swelling [ ] ; Back Pain [ ] ; Rash [ ]   Psych: Depression[ ] ; Anxiety[ ]   Heme: Bleeding problems [ ] ; Clotting disorders [ ] ; Anemia [ ]   Endocrine: Diabetes [ ] ; Thyroid dysfunction[ ]    Home Medications Prior to Admission medications   Medication Sig Start Date End Date Taking? Authorizing Provider  acetaminophen (TYLENOL) 500 MG tablet Take 1,000 mg by mouth every 6 (six) hours as needed for moderate pain or headache.   Yes [provider]  ADVAIR DISKUS 100-50 MCG/ACT AEPB INHALE 1 PUFF INTO THE LUNGS TWICE DAILY 12/13/20  Yes Saguier, Percell Miller, PA-C  albuterol (VENTOLIN HFA) 108 (90 Base) MCG/ACT inhaler Inhale 2 puffs into the lungs every 6 (six) hours as needed for wheezing or shortness of breath. 12/20/20  Yes [name], MD  carvedilol (COREG) 25 MG tablet Take 12.5 mg by mouth 2 (two) times daily with a meal.   Yes [provider]  ELIQUIS 5 MG TABS tablet TAKE 1 TABLET(5 MG) BY MOUTH TWICE DAILY 05/25/20  Yes Larey Dresser, MD  empagliflozin (JARDIANCE) 25 MG TABS tablet Take 1 tablet (25 mg total) by mouth daily before breakfast. 12/02/20  Yes Shamleffer, Melanie Crazier, MD  ENTRESTO 49-51 MG Take 1 tablet by mouth 2 (two) times daily. 12/10/20  Yes [provider]  ezetimibe (ZETIA) 10 MG tablet Take 1 tablet (10 mg total) by mouth daily. 06/17/20  Yes Larey Dresser, MD  icosapent Ethyl (VASCEPA) 1 g capsule  Take 2 capsules (2 g total) by mouth 2 (two) times daily. 06/04/20  Yes Larey Dresser, MD  insulin glargine (LANTUS SOLOSTAR) 100 UNIT/ML Solostar Pen Inject 10 Units into the skin at bedtime. 09/13/20  Yes [name], MD  insulin lispro (HUMALOG KWIKPEN) 100 UNIT/ML KwikPen Max daily 15 units 11/30/20  Yes Shamleffer, Melanie Crazier, MD  metFORMIN (GLUCOPHAGE) 1000 MG tablet Take 1 tablet (1,000 mg total) by mouth daily with breakfast. 03/04/20  Yes Aundra Dubin,  Elby Showers, MD  predniSONE (DELTASONE) 5 MG tablet TAKE 1 TABLET BY MOUTH EVERY DAY X 10 DAYS THEN 1/2 TABLET X 10 DAYS THEN 1/2 TABLET EVERY OTHER DAY THEN STOP. START AFTER 10 MG TAPER 12/23/20  Yes [name], MD  ranolazine (RANEXA) 500 MG 12 hr tablet Take 1 tablet (500 mg total) by mouth 2 (two) times daily. 07/08/20  Yes Larey Dresser, MD  rosuvastatin (CRESTOR) 40 MG tablet Take 1 tablet (40 mg total) by mouth daily. 11/10/20  Yes [name], MD  sacubitril-valsartan (ENTRESTO) 24-26 MG Take 1 tablet by mouth 2 (two) times daily. 09/28/20  Yes Larey Dresser, MD  cholecalciferol (VITAMIN D) 1000 units tablet Take 1 tablet (1,000 Units total) by mouth daily. 12/09/19   Amin, Jeanella Flattery, MD  glucose blood test strip Use as instructed 09/03/20   [name], MD  Insulin Pen Needle (PEN NEEDLES) 32G X 4 MM MISC Inject 1 Device into the skin in the morning, at noon, in the evening, and at bedtime. Use 1 at bedtime with Lantus.  Dx code: E11.9 11/30/20   Shamleffer, Melanie Crazier, MD  nitroGLYCERIN (NITROSTAT) 0.4 MG SL tablet Place 1 tablet (0.4 mg total) under the tongue every 5 (five) minutes as needed for chest pain. 12/09/19   Amin, Jeanella Flattery, MD  pantoprazole (PROTONIX) 40 MG tablet Take 1 tablet (40 mg total) by mouth daily. 10/26/20 12/10/20  Allred, Jeneen Rinks, MD  polyvinyl alcohol (LIQUIFILM TEARS) 1.4 % ophthalmic solution Place 1 drop into both eyes daily as needed for dry eyes (irritation).    [provider]   predniSONE (DELTASONE) 10 MG tablet Take 3 tablets (30 mg total) by mouth daily with breakfast for 21 days, THEN 2 tablets (20 mg total) daily with breakfast for 21 days, THEN 1 tablet (10 mg total) daily with breakfast for 21 days, THEN 0.5 tablets (5 mg total) daily with breakfast for 21 days. 10/12/20 01/04/21  Brand Males, MD  sodium polystyrene (KAYEXALATE) 15 GM/60ML suspension Take 60 mLs (15 g total) by mouth daily as needed. 09/08/20   [name], MD  torsemide (DEMADEX) 20 MG tablet Take 1 tablet (20 mg total) by mouth in the morning. Start taking on 08/22/20 08/20/20 11/23/20  Arrien, Jimmy Picket, MD    Past Medical History: Past Medical History:  Diagnosis Date   Allergy    seasonal   Asthma    mild, intermittent   Atrial fibrillation with rapid ventricular response ([place]) 03/25/2018   CAD, multiple vessel    Three-vessel disease involving mid RCA 85% (PCI with DES), OM 2 80% in branch and ostD1 ~90%. Only the mid RCA with PCI target.    Carotid bruit 08/05/2010   Bruit noted on left    Chronic combined systolic and diastolic CHF (congestive heart failure) (HCC)    CKD (chronic kidney disease), stage III ([place])    Diabetes mellitus    type 2   Fracture of multiple ribs 02/12/2015   Hemothorax on right 01/23/2015   Hyperlipidemia    Hypertension    Hypocalcemia 02/12/2015   Lactic acidosis 03/25/2018   Liver function study, abnormal 04/26/2011   Multiple rib fractures    Obesity    Persistent atrial fibrillation ([place])    Pneumonia     Past Surgical History: Past Surgical History:  Procedure Laterality Date   APPENDECTOMY  1975   ATRIAL FIBRILLATION ABLATION N/A 10/26/2020   Procedure: ATRIAL FIBRILLATION ABLATION;  Surgeon:  Hillis Range, MD;  Location: [facility] INVASIVE CV LAB;  Service: Cardiovascular;  Laterality: N/A;   CARDIAC CATHETERIZATION N/A 02/17/2016   Procedure: Left Heart Cath and Coronary Angiography;  Surgeon: Marykay Lex, MD;  Location: [facility]  INVASIVE CV LAB;  Service: Cardiovascular: mRCA 85% (PCI), branch of OM2 80%,  ostD1 90%; EF 35-45%   CARDIAC CATHETERIZATION N/A 02/17/2016   Procedure: Coronary Stent Intervention;  Surgeon: Marykay Lex, MD;  Location: [facility] INVASIVE CV LAB;  Service: Cardiovascular: mRCA PCI : Andre Lefort PREM MR 2.4P80 DES   CARDIOVERSION N/A 03/29/2018   Procedure: CARDIOVERSION;  Surgeon: Laurey Morale, MD;  Location: [facility] ENDOSCOPY;  Service: Cardiovascular;  Laterality: N/A;   CARDIOVERSION N/A 04/02/2018   Procedure: CARDIOVERSION;  Surgeon: Laurey Morale, MD;  Location: [facility] ENDOSCOPY;  Service: Cardiovascular;  Laterality: N/A;   CARDIOVERSION N/A 04/19/2018   Procedure: CARDIOVERSION;  Surgeon: Laurey Morale, MD;  Location: [facility] ENDOSCOPY;  Service: Cardiovascular;  Laterality: N/A;   CARDIOVERSION N/A 08/11/2020   Procedure: CARDIOVERSION;  Surgeon: Laurey Morale, MD;  Location: [facility] ENDOSCOPY;  Service: Cardiovascular;  Laterality: N/A;   CATARACT EXTRACTION  2010   b/l   CORONARY STENT INTERVENTION N/A 04/03/2018   Procedure: CORONARY STENT INTERVENTION;  Surgeon: Lennette Bihari, MD;  Location: [facility] INVASIVE CV LAB;  Service: Cardiovascular;  Laterality: N/A;   CORONARY STENT INTERVENTION N/A 03/02/2020   Procedure: CORONARY STENT INTERVENTION;  Surgeon: Corky Crafts, MD;  Location: [facility] INVASIVE CV LAB;  Service: Cardiovascular;  Laterality: N/A;   INTRAVASCULAR ULTRASOUND/IVUS N/A 03/02/2020   Procedure: Intravascular Ultrasound/IVUS;  Surgeon: Corky Crafts, MD;  Location: [facility] INVASIVE CV LAB;  Service: Cardiovascular;  Laterality: N/A;   RETINAL LASER PROCEDURE  1985   RIGHT/LEFT HEART CATH AND CORONARY ANGIOGRAPHY N/A 04/03/2018   Procedure: RIGHT/LEFT HEART CATH AND CORONARY ANGIOGRAPHY;  Surgeon: Laurey Morale, MD;  Location: [facility] INVASIVE CV LAB;  Service: Cardiovascular;  Laterality: N/A;   RIGHT/LEFT HEART CATH AND CORONARY ANGIOGRAPHY N/A 03/02/2020   Procedure:  RIGHT/LEFT HEART CATH AND CORONARY ANGIOGRAPHY;  Surgeon: Laurey Morale, MD;  Location: [facility] INVASIVE CV LAB;  Service: Cardiovascular;  Laterality: N/A;   TEE WITHOUT CARDIOVERSION N/A 03/29/2018   Procedure: TRANSESOPHAGEAL ECHOCARDIOGRAM (TEE);  Surgeon: Laurey Morale, MD;  Location: [facility] ENDOSCOPY;  Service: Cardiovascular;  Laterality: N/A;   TEE WITHOUT CARDIOVERSION N/A 04/19/2018   Procedure: TRANSESOPHAGEAL ECHOCARDIOGRAM (TEE);  Surgeon: Laurey Morale, MD;  Location: [facility] ENDOSCOPY;  Service: Cardiovascular;  Laterality: N/A;   TEE WITHOUT CARDIOVERSION N/A 03/02/2020   Procedure: TRANSESOPHAGEAL ECHOCARDIOGRAM (TEE);  Surgeon: Laurey Morale, MD;  Location: [facility] ENDOSCOPY;  Service: Cardiovascular;  Laterality: N/A;   TEE WITHOUT CARDIOVERSION N/A 10/26/2020   Procedure: TRANSESOPHAGEAL ECHOCARDIOGRAM (TEE);  Surgeon: Elease Hashimoto Deloris Ping, MD;  Location: [facility] ENDOSCOPY;  Service: Cardiovascular;  Laterality: N/A;   TONSILLECTOMY AND ADENOIDECTOMY  1947   TRANSTHORACIC ECHOCARDIOGRAM  02/2016   Mild LVH. Moderately reduced EF of 35-40% with diffuse hypokinesis. Indeterminate diastolic function. Likely elevated LA pressures. Mild aortic stenosis (mean gradient 10 mmHg). Mild MR. Mild LA dilation. Mild to moderate PA hypertension with 39 mmHg   VIDEO ASSISTED THORACOSCOPY (VATS)/THOROCOTOMY Right 01/24/2015   Procedure: VIDEO ASSISTED THORACOSCOPY (VATS) FOR DRAINAGE OF RIGHT HEMOTHORAX;  Surgeon: Alleen Borne, MD;  Location: [facility] OR;  Service: Thoracic;  Laterality: Right;    Family History:  Family History  Problem Relation Age of Onset   CAD Neg Hx  Social History: Social History   Socioeconomic History   Marital status: Married    Spouse name: Not on file   Number of children: Not on file   Years of education: Not on file   Highest education level: Not on file  Occupational History   Not on file  Tobacco Use   Smoking status: Never   Smokeless tobacco: Never  Vaping Use    Vaping Use: Never used  Substance and Sexual Activity   Alcohol use: Yes    Comment: rare use now   Drug use: No   Sexual activity: Not on file  Other Topics Concern   Not on file  Social History Narrative   Not on file   Social Determinants of Health   Financial Resource Strain: Not on file  Food Insecurity: Not on file  Transportation Needs: Not on file  Physical Activity: Not on file  Stress: Not on file  Social Connections: Not on file    Allergies:  Allergies  Allergen Reactions   Amiodarone Shortness Of Breath    Objective:    Vital Signs:   Pulse Rate:  [43-120] 92 (08/15 1530) Resp:  [19-33] 28 (08/15 1530) BP: (94-128)/(54-83) 128/83 (08/15 1530) SpO2:  [88 %-93 %] 92 % (08/15 1530) Weight:  [122.5 kg] 122.5 kg (08/15 1331)   Filed Weights   12/27/20 1331  Weight: 122.5 kg     Physical Exam     General:  Well appearing. No respiratory difficulty HEENT: Normal Neck: Supple. JVD 10 cm. Carotids 2+ bilat; no bruits. No lymphadenopathy or thyromegaly appreciated. Cor: PMI nondisplaced. Irregularly irregular rhythm, tachy rate. No rubs, gallops or murmurs. Lungs: decreased BS at the bases +crackles LLL, otherwise clear  Abdomen: Soft, nontender, nondistended. No hepatosplenomegaly. No bruits or masses. Good bowel sounds. Extremities: No cyanosis, clubbing, rash, edema Neuro: Alert & oriented x 3, cranial nerves grossly intact. moves all 4 extremities w/o difficulty. Affect pleasant.   Telemetry   Afib 110s occasional PVCs   EKG   Afib w/ RVR 136 bpm   Labs     Basic Metabolic Panel: Recent Labs  Lab 12/27/20 1336 12/27/20 1409  NA 135  --   K 4.0  --   CL 102  --   CO2 22  --   GLUCOSE 263*  --   BUN 22  --   CREATININE 1.57*  --   CALCIUM 8.5*  --   MG  --  2.3    Liver Function Tests: No results for input(s): AST, ALT, ALKPHOS, BILITOT, PROT, ALBUMIN in the last 168 hours. No results for input(s): LIPASE, AMYLASE in the last  168 hours. No results for input(s): AMMONIA in the last 168 hours.  CBC: Recent Labs  Lab 12/27/20 1336  WBC 11.5*  HGB 12.2*  HCT 38.8*  MCV 93.9  PLT 506*    Cardiac Enzymes: No results for input(s): CKTOTAL, CKMB, CKMBINDEX, TROPONINI in the last 168 hours.  BNP: BNP (last 3 results) Recent Labs    08/31/20 1547 09/28/20 1225 12/27/20 1409  BNP 124.8* 360.9* 502.6*    ProBNP (last 3 results) No results for input(s): PROBNP in the last 8760 hours.   CBG: No results for input(s): GLUCAP in the last 168 hours.  Coagulation Studies: No results for input(s): LABPROT, INR in the last 72 hours.  Imaging: DG Chest Portable 1 View  Result Date: 12/27/2020 CLINICAL DATA:  Dyspnea with exertion. EXAM: PORTABLE CHEST 1 VIEW COMPARISON:  November 24, 2020. FINDINGS: Stable cardiomegaly. No pneumothorax is noted. Increased bilateral perihilar and basilar opacities are noted concerning for pneumonia or possibly edema. Bony thorax is unremarkable. IMPRESSION: Increased bilateral lung opacities as described above. Electronically Signed   By: Marijo Conception M.D.   On: 12/27/2020 14:00     Patient Profile   80 y/o male w/ h/o CAD, difficult to control Afib w/ prior failed DCCVs and recent Afib ablation 6/22 w/ recurrence, h/o amiodarone toxicity, h/o systolic heart failure felt to be tachymediated w/ improved EF 25-30%>>50-55% and stage III CKD, admitted for a/c CHF in the setting of recurrent Afib w/ RVR.   Assessment/Plan   1. Recurrent Atrial Fibrillation w/ RVR: Had successful DC-CV on 03/29/18 but then back in atrial fibrillation. Ranolazine added, but failed DCCV again 04/02/18. DCCV 04/19/18 was successful.  AF with RVR at 3/22 admission, amiodarone restarted and he was cardioverted to NSR, but he developed amiodarone lung toxicity.  Amiodarone was stopped.  He had subsequently gone back into rate-controlled atrial fibrillation. Underwent Afib Ablation 6/22. Now w/ recurrent PAF  w/ RVR.  - Plan DCCV tomorrow (denies missed doses of Eliquis)  - Continue Eliquis 5 mg bid - Continue Coreg 12.5 mg bid - Continue ranolazine 500 mg bid  - No amiodarone with history of lung toxicity.   - ? Tikosyn. Will d/w EP - Start Cardizem for rate control 10 mg bolus x 1 followed by gtt at 5 mg/hr  - Start digoxin for rate control, 0.125 mg IV Dose x 1. Start PO 0.125 mg daily tomorrow (monitor renal fx)  - Keep K > 4.0 and Mg >2.0  2. Acute on Chronic systolic=>diastolic CHF: Prior ischemic cardiomyopathy, improved after PCI in 2017.  Echo 03/2018 with EF back down to 35-40% and moderate-severe RV dysfunction. This was in the setting of atrial fibrillation with RVR. EF 40% on TEE 04/2018.  Echo was in 3/20, showing EF up to 50%.  Suspect that he primarily had a tachycardia-mediated CMP at that point.  Echo in 7/21 with EF 25-30%, severe RV dysfunction.  TEE in 10/21, however, showed EF up to 50%.  He had PCI to LAD in 10/21. Echo in 3/22 showed stable EF 50-55% with normal RV.  TEE 6/22 at time of Afib ablation showed stable EF 50-55% w/ normal RV. Now w/ acute on chronic CHF in setting of recurrent afib w/ RVR. Mildly fluid overloaded. NYHA Class II-III. Bedside Echo shows mildly reduced EF now 40-45% - C/w IV Lasix 80 mg bid - Plan DCCV tomorrow  - Continue Entresto to 24/26 bid (dose recently reduced given improved EF and bump in SCr) - Continue Coreg 12.5 mg bid (did not tolerate 25 mg dose due to low BP and dizziness)  - Continue Jardiance 25 mg daily  - Start digoxin for rate control, 0.125 mg IV Dose x 1. Start PO 0.125 mg daily tomorrow  - repeat 2D Echo  3. CKD: Stage 3.  SCr 1.57 c/w baseline - follow BMP w/ diuresis  4. CAD: History of DES to RCA in 9/17.  Underwent LHC 04/03/18 with PCI to prox left circ. LHC in 10/21 with PCI to LAD.   - He is on Eliquis so no ASA.   - He is now off Plavix (finished 6 month therapy)  - Continue statin.  5. DM2: on Jardiance  6. Aortic  stenosis: Mild on 3/22 echo.  7. Mitral regurgitation: Mild on 3/22 echo.  8. 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 He is off amiodarone.  He improved with steroids.   - Continue steroid taper per pulmonary, he is followed with pulmonary.  - Bactrim for PJP prophylaxis     Lyda Jester, PA-C 12/27/2020, 3:52 PM  Advanced Heart Failure Team Pager [phone] (M-F; 7a - 5p)  Please contact [facility] for night-coverage after hours (4p -7a ) and weekends on [email or web address]  Patient seen and examined with the above-signed Advanced Practice Provider and/or Housestaff. I personally reviewed laboratory data, imaging studies and relevant notes. I independently examined the patient and formulated the important aspects of the plan. I have edited the note to reflect any of my changes or salient points. I have personally discussed the plan with the patient and/or family.  80 yo male as above with CAD, CKD 3b, chronic systolic HF with recovered EF and persistent AF s/p previous AF ablation in 6/22 presented with acute/chronic HF symptoms in setting of recurrent AF.   Has h/o severe amio toxicity and has been on prednisone.   Says he thinks he has been in AF for at least 2 weeks. Symptoms increasing over past few days and now with NYHA IV HF symptoms.   In ER, AF at 120-140. Received IV and po cardizem + 40 IV lasix. Rates still 120-130. BNP 503  General:  SOB at rest  HEENT: normal Neck: supple. JVP to ear. Carotids 2+ bilat; no bruits. No lymphadenopathy or thryomegaly appreciated. Cor: PMI nondisplaced. Irreg tachy . 2/6 AS Lungs: + crackles Abdomen: soft, nontender, nondistended. No hepatosplenomegaly. No bruits or masses. Good bowel sounds. Extremities: no cyanosis, clubbing, rash, 2+ edema Neuro: alert & orientedx3, cranial nerves grossly intact. moves all 4 extremities w/o difficulty. Affect pleasant  He is quite symptomatic with volume overload and rapid AF. Options for rate  control limited in setting of acute HF. I did bedside echo and EF 40-45% with mild to moderate AS.   Will increase lasix to 80 IV bid. Start low-dose cardizem gtt cautiously and will give digoxin. Continue Eliquis. Hopefully respiratory status will improve enough by tomorrow to permit DC-CV. Can consider Tikosyn as needed.   Glori Bickers, MD  9:59 PM

## 2020-12-27 NOTE — Progress Notes (Addendum)
Called for admission for this 80 year old male with history of hypertension, diabetes mellitus, CKD 3, CAD, chronic systolic CHF with EF 40-45% who has been following  cardiology service closely as outpatient for persistent atrial fibrillation initially requiring cardioversion in early summer->subsequently placed on amiodarone resulting in pulmonary toxicity->evaluated by Dr. Marchelle Gearing and transiently needed home O2 2 L, subsequently improved and been off 02->recurrent A. fib requiring AV ablation recently->now presenting with dyspnea and also noted recurrent A. fib episodes on his apple watch getting more frequent over the last week.  He was advised by cardiology to come to ED.  Work-up in the ED revealed CHF exacerbation and patient received IV diuresis due to which he already feels better.  Still requiring O2 2 L, irregular heartbeat, mild bibasilar crackles on exam without leg edema->noted that cardiology already evaluated patient and admitting this patient.  Discussed with cardiology APP--they will consult hospitalist service if required.  Will defer further admission orders/management to cardiology service.  Bed placement notified.  Appreciate cardiology assistance and hospitalist service will be available if inpatient management for other comorbidities required.

## 2020-12-27 NOTE — Progress Notes (Signed)
   12/27/20 2011  Assess: MEWS Score  Temp 98.3 F (36.8 C)  BP (!) 131/97  Pulse Rate (!) 107  ECG Heart Rate (!) 107  Resp (!) 23  Level of Consciousness Alert  SpO2 93 %  O2 Device Nasal Cannula  O2 Flow Rate (L/min) 3 L/min  Assess: MEWS Score  MEWS Temp 0  MEWS Systolic 0  MEWS Pulse 1  MEWS RR 1  MEWS LOC 0  MEWS Score 2  MEWS Score Color Yellow  Assess: if the MEWS score is Yellow or Red  Were vital signs taken at a resting state? Yes  Focused Assessment No change from prior assessment  Early Detection of Sepsis Score *See Row Information* Medium  MEWS guidelines implemented *See Row Information* Yes  Treat  MEWS Interventions Escalated (See documentation below)  Pain Scale 0-10  Pain Score 0  Take Vital Signs  Increase Vital Sign Frequency  Yellow: Q 2hr X 2 then Q 4hr X 2, if remains yellow, continue Q 4hrs  Escalate  MEWS: Escalate Yellow: discuss with charge nurse/RN and consider discussing with provider and RRT  Notify: Charge Nurse/RN  Name of Charge Nurse/RN Notified Posey Boyer, RN  Date Charge Nurse/RN Notified 12/27/20  Time Charge Nurse/RN Notified 2031  Document  Patient Outcome Not stable and remains on department  Progress note created (see row info) Yes

## 2020-12-27 NOTE — ED Notes (Signed)
Report attempted 

## 2020-12-27 NOTE — ED Provider Notes (Addendum)
[facility] EMERGENCY DEPARTMENT Provider Note   CSN: [id number] Arrival date & time: 12/27/20  1319     History Chief Complaint  Patient presents with   Atrial Fibrillation   Shortness of Breath    David Huynh is a 80 y.o. male.  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  Patient just recently had ablation done with Dr. Johney Frame however he has reverted back to atrial fibrillation.  For the past couple days his shortness of breath has become worse.  Today he felt like his heart rate was fast and his breathing was worse and ambulance was called.  He was noted to be hypoxic on room air, comfortable on 2 L nasal cannula.  He arrives with atrial fibrillation with RVR, stable blood pressure.  Denies any chest pain, back pain, lower extremity swelling.  Past Medical History:  Diagnosis Date   Allergy    seasonal   Asthma    mild, intermittent   Atrial fibrillation with rapid ventricular response (HCC) 03/25/2018   CAD, multiple vessel    Three-vessel disease involving mid RCA 85% (PCI with DES), OM 2 80% in branch and ostD1 ~90%. Only the mid RCA with PCI target.    Carotid bruit 08/05/2010   Bruit noted on left    Chronic combined systolic and diastolic CHF (congestive heart failure) (HCC)    CKD (chronic kidney disease), stage III (HCC)    Diabetes mellitus    type 2   Fracture of multiple ribs 02/12/2015   Hemothorax on right 01/23/2015   Hyperlipidemia    Hypertension    Hypocalcemia 02/12/2015   Lactic acidosis 03/25/2018   Liver function study, abnormal 04/26/2011   Multiple rib fractures    Obesity    Persistent atrial fibrillation (HCC)    Pneumonia     Patient Active  Problem List   Diagnosis Date Noted   Secondary hypercoagulable state (HCC) 11/23/2020   Hyperkalemia 09/05/2020   Diastolic CHF (HCC) 08/18/2020   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   Acute CHF (congestive heart failure) (HCC) 08/10/2020   Syncope and collapse 08/09/2020   Acute-on-chronic kidney injury (HCC) 08/09/2020   Type 2 diabetes mellitus with diabetic polyneuropathy, without long-term current use of insulin (HCC) 02/24/2020   Acute on chronic combined systolic (congestive) and diastolic (congestive) heart failure (HCC) 12/06/2019   Acute hypoxemic respiratory failure (HCC) 12/06/2019   Persistent atrial fibrillation (HCC) 03/25/2018   CAD in native artery    Aortic stenosis, mild 06/28/2016   Cardiomyopathy, ischemic 02/17/2016   Acute on chronic combined systolic and diastolic CHF (congestive heart failure) (HCC) 02/17/2016   Dyspnea    Hypertensive heart disease    Hypocalcemia 02/12/2015   Fracture of multiple ribs 02/12/2015   Other specified forms of effusion, except tuberculous    Essential hypertension    Other and unspecified hyperlipidemia    Liver function study, abnormal 04/26/2011   Obesity    Hyperlipidemia with target LDL less than 70    Mild persistent asthma    Allergic state     Past Surgical History:  Procedure Laterality Date   APPENDECTOMY  1975   ATRIAL FIBRILLATION ABLATION N/A 10/26/2020   Procedure: ATRIAL FIBRILLATION ABLATION;  Surgeon: Hillis Range, MD;  Location: [facility] INV[facility] LAB;  Service: Cardiovascular;  Laterality: N/A;   CARDIAC CATHETERIZATION N/A 02/17/2016   Procedure: Left Heart Cath and Coronary Angiography;  Surgeon: Marykay Lexavid W Harding, MD;  [facility];  Service: Cardiovascular: mRCA 85% (PCI), branch of OM2 80%,  ostD1 90%; EF 35-45%   CARDIAC CATHETERIZATION N/A 02/17/2016   Procedure: Coronary Stent Intervention;  Surgeon: Marykay Lexavid W Harding, MD;  [facility];  Service: Cardiovascular: mRCA  PCI : Andre LefortSTENT PROMUS PREM MR [id number] DES   CARDIOVERSION N/A 03/29/2018   Procedure: CARDIOVERSION;  Surgeon: Laurey MoraleMcLean, Dalton S, MD;  [facility];  Service: Cardiovascular;  Laterality: N/A;   CARDIOVERSION N/A 04/02/2018   Procedure: CARDIOVERSION;  Surgeon: Laurey MoraleMcLean, Dalton S, MD;  [facility];  Service: Cardiovascular;  Laterality: N/A;   CARDIOVERSION N/A 04/19/2018   Procedure: CARDIOVERSION;  Surgeon: Laurey MoraleMcLean, Dalton S, MD;  [facility];  Service: Cardiovascular;  Laterality: N/A;   CARDIOVERSION N/A 08/11/2020   Procedure: CARDIOVERSION;  Surgeon: Laurey MoraleMcLean, Dalton S, MD;  [facility];  Service: Cardiovascular;  Laterality: N/A;   CATARACT EXTRACTION  2010   b/l   CORONARY STENT INTERVENTION N/A 04/03/2018   Procedure: CORONARY STENT INTERVENTION;  Surgeon: Lennette BihariKelly, Thomas A, MD;  [facility];  Service: Cardiovascular;  Laterality: N/A;   CORONARY STENT INTERVENTION N/A 03/02/2020   Procedure: CORONARY STENT INTERVENTION;  Surgeon: Corky CraftsVaranasi, Jayadeep S, MD;  [facility]: [facility];  Service: Cardiovascular;  Laterality: N/A;   INTRAVASCULAR ULTRASOUND/IVUS N/A 03/02/2020   Procedure: Intravascular Ultrasound/IVUS;  Surgeon: Corky CraftsVaranasi, Jayadeep S, MD;  [facility];  Service: Cardiovascular;  Laterality: N/A;   RETINAL LASER PROCEDURE  1985   RIGHT/LEFT HEART CATH AND CORONARY ANGIOGRAPHY N/A 04/03/2018   Procedure: RIGHT/LEFT HEART CATH AND CORONARY ANGIOGRAPHY;  Surgeon: Laurey MoraleMcLean, Dalton S, MD;  [facility];  Service: Cardiovascular;  Laterality: N/A;   RIGHT/LEFT HEART CATH AND CORONARY ANGIOGRAPHY N/A 03/02/2020   Procedure: RIGHT/LEFT HEART CATH AND CORONARY ANGIOGRAPHY;  Surgeon: Laurey MoraleMcLean, Dalton S, MD;  [facility];  Service: Cardiovascular;  Laterality: N/A;   TEE WITHOUT CARDIOVERSION N/A 03/29/2018   Procedure: TRANSESOPHAGEAL ECHOCARDIOGRAM (TEE);  Surgeon: Laurey MoraleMcLean, Dalton S, MD;   [facility];  Service: Cardiovascular;  Laterality: N/A;   TEE WITHOUT CARDIOVERSION N/A 04/19/2018   Procedure: TRANSESOPHAGEAL ECHOCARDIOGRAM (TEE);  Surgeon: Laurey MoraleMcLean, Dalton S, MD;  [facility];  Service: Cardiovascular;  Laterality: N/A;   TEE WITHOUT CARDIOVERSION N/A 03/02/2020   Procedure: TRANSESOPHAGEAL ECHOCARDIOGRAM (TEE);  Surgeon: Laurey MoraleMcLean, Dalton S, MD;  [facility];  Service: Cardiovascular;  Laterality: N/A;   TEE WITHOUT CARDIOVERSION N/A 10/26/2020   Procedure: TRANSESOPHAGEAL ECHOCARDIOGRAM (TEE);  Surgeon: Elease HashimotoNahser, Deloris PingPhilip J, MD;  [facility];  Service: Cardiovascular;  Laterality: N/A;   TONSILLECTOMY AND ADENOIDECTOMY  1947   TRANSTHORACIC ECHOCARDIOGRAM  02/2016   Mild LVH. Moderately reduced EF of 35-40% with diffuse hypokinesis. Indeterminate diastolic function. Likely elevated LA pressures. Mild aortic stenosis (mean gradient 10 mmHg). Mild MR. Mild LA dilation. Mild to moderate PA hypertension with 39 mmHg   VIDEO ASSISTED THORACOSCOPY (VATS)/THOROCOTOMY Right 01/24/2015   Procedure: VIDEO ASSISTED THORACOSCOPY (VATS) FOR DRAINAGE OF RIGHT HEMOTHORAX;  Surgeon: Alleen BorneBryan K Bartle, MD;  Location: [facility];  Service: Thoracic;  Laterality: Right;       Family History  Problem Relation Age of Onset   CAD Neg Hx     Social  History   Tobacco Use   Smoking status: Never   Smokeless tobacco: Never  Vaping Use   Vaping Use: Never used  Substance Use Topics   Alcohol use: Yes    Comment: rare use now   Drug use: No    Home Medications Prior to Admission medications   Medication Sig Start Date End Date Taking? Authorizing Provider  acetaminophen (TYLENOL) 500 MG tablet Take 1,000 mg by mouth every 6 (six) hours as needed for moderate pain or headache.   Yes [provider]  ADVAIR DISKUS 100-50 MCG/ACT AEPB INHALE 1 PUFF INTO THE LUNGS TWICE DAILY 12/13/20  Yes Saguier, Ramon Dredge, PA-C  albuterol (VENTOLIN HFA) 108 (90 Base)  MCG/ACT inhaler Inhale 2 puffs into the lungs every 6 (six) hours as needed for wheezing or shortness of breath. 12/20/20  Yes Bradd Canary, MD  carvedilol (COREG) 25 MG tablet Take 12.5 mg by mouth 2 (two) times daily with a meal.   Yes [provider]  ELIQUIS 5 MG TABS tablet TAKE 1 TABLET(5 MG) BY MOUTH TWICE DAILY 05/25/20  Yes Laurey Morale, MD  empagliflozin (JARDIANCE) 25 MG TABS tablet Take 1 tablet (25 mg total) by mouth daily before breakfast. 12/02/20  Yes Shamleffer, Konrad Dolores, MD  ENTRESTO 49-51 MG Take 1 tablet by mouth 2 (two) times daily. 12/10/20  Yes [provider]  ezetimibe (ZETIA) 10 MG tablet Take 1 tablet (10 mg total) by mouth daily. 06/17/20  Yes Laurey Morale, MD  icosapent Ethyl (VASCEPA) 1 g capsule Take 2 capsules (2 g total) by mouth 2 (two) times daily. 06/04/20  Yes Laurey Morale, MD  insulin glargine (LANTUS SOLOSTAR) 100 UNIT/ML Solostar Pen Inject 10 Units into the skin at bedtime. 09/13/20  Yes Bradd Canary, MD  insulin lispro (HUMALOG KWIKPEN) 100 UNIT/ML KwikPen Max daily 15 units 11/30/20  Yes Shamleffer, Konrad Dolores, MD  metFORMIN (GLUCOPHAGE) 1000 MG tablet Take 1 tablet (1,000 mg total) by mouth daily with breakfast. 03/04/20  Yes Laurey Morale, MD  predniSONE (DELTASONE) 5 MG tablet TAKE 1 TABLET BY MOUTH EVERY DAY X 10 DAYS THEN 1/2 TABLET X 10 DAYS THEN 1/2 TABLET EVERY OTHER DAY THEN STOP. START AFTER 10 MG TAPER 12/23/20  Yes Bradd Canary, MD  ranolazine (RANEXA) 500 MG 12 hr tablet Take 1 tablet (500 mg total) by mouth 2 (two) times daily. 07/08/20  Yes Laurey Morale, MD  rosuvastatin (CRESTOR) 40 MG tablet Take 1 tablet (40 mg total) by mouth daily. 11/10/20  Yes Bradd Canary, MD  sacubitril-valsartan (ENTRESTO) 24-26 MG Take 1 tablet by mouth 2 (two) times daily. 09/28/20  Yes Laurey Morale, MD  cholecalciferol (VITAMIN D) 1000 units tablet Take 1 tablet (1,000 Units total) by mouth daily. 12/09/19   Amin,  Loura Halt, MD  glucose blood test strip Use as instructed 09/03/20   Bradd Canary, MD  Insulin Pen Needle (PEN NEEDLES) 32G X 4 MM MISC Inject 1 Device into the skin in the morning, at noon, in the evening, and at bedtime. Use 1 at bedtime with Lantus.  Dx code: E11.9 11/30/20   Shamleffer, Konrad Dolores, MD  nitroGLYCERIN (NITROSTAT) 0.4 MG SL tablet Place 1 tablet (0.4 mg total) under the tongue every 5 (five) minutes as needed for chest pain. 12/09/19   Amin, Loura Halt, MD  pantoprazole (PROTONIX) 40 MG tablet Take 1 tablet (40 mg total) by mouth daily. 10/26/20 12/10/20  Allred, Fayrene Fearing,  MD  polyvinyl alcohol (LIQUIFILM TEARS) 1.4 % ophthalmic solution Place 1 drop into both eyes daily as needed for dry eyes (irritation).    [provider]  predniSONE (DELTASONE) 10 MG tablet Take 3 tablets (30 mg total) by mouth daily with breakfast for 21 days, THEN 2 tablets (20 mg total) daily with breakfast for 21 days, THEN 1 tablet (10 mg total) daily with breakfast for 21 days, THEN 0.5 tablets (5 mg total) daily with breakfast for 21 days. 10/12/20 01/04/21  Kalman Shan, MD  sodium polystyrene (KAYEXALATE) 15 GM/60ML suspension Take 60 mLs (15 g total) by mouth daily as needed. 09/08/20   Bradd Canary, MD  torsemide (DEMADEX) 20 MG tablet Take 1 tablet (20 mg total) by mouth in the morning. Start taking on 08/22/20 08/20/20 11/23/20  Arrien, York Ram, MD    Allergies    Amiodarone  Review of Systems   Review of Systems  Constitutional:  Positive for fatigue. Negative for chills and fever.  HENT:  Negative for congestion.   Eyes:  Negative for visual disturbance.  Respiratory:  Positive for shortness of breath.   Cardiovascular:  Positive for palpitations. Negative for chest pain and leg swelling.  Gastrointestinal:  Negative for abdominal pain, diarrhea and vomiting.  Genitourinary:  Negative for dysuria.  Skin:  Negative for rash.  Neurological:  Negative for headaches.    Physical Exam Updated Vital Signs BP 128/83   Pulse 92   Resp (!) 28   Ht 6\' 2"  (1.88 m)   Wt 122.5 kg   SpO2 92%   BMI 34.67 kg/m   Physical Exam Vitals and nursing note reviewed.  Constitutional:      Appearance: Normal appearance.  HENT:     Head: Normocephalic.     Mouth/Throat:     Mouth: Mucous membranes are moist.  Cardiovascular:     Rate and Rhythm: Normal rate.  Pulmonary:     Effort: Pulmonary effort is normal. No respiratory distress.     Breath sounds: Rales present.  Abdominal:     Palpations: Abdomen is soft.     Tenderness: There is no abdominal tenderness.  Musculoskeletal:     Right lower leg: No edema.     Left lower leg: No edema.  Skin:    General: Skin is warm.  Neurological:     Mental Status: He is alert and oriented to person, place, and time. Mental status is at baseline.  Psychiatric:        Mood and Affect: Mood normal.    ED Results / Procedures / Treatments   Labs (all labs ordered are listed, but only abnormal results are displayed) Labs Reviewed  BASIC METABOLIC PANEL - Abnormal; Notable for the following components:      Result Value   Glucose, Bld 263 (*)    Creatinine, Ser 1.57 (*)    Calcium 8.5 (*)    GFR, Estimated 45 (*)    All other components within normal limits  CBC - Abnormal; Notable for the following components:   WBC 11.5 (*)    RBC 4.13 (*)    Hemoglobin 12.2 (*)    HCT 38.8 (*)    Platelets 506 (*)    All other components within normal limits  BRAIN NATRIURETIC PEPTIDE - Abnormal; Notable for the following components:   B Natriuretic Peptide 502.6 (*)    All other components within normal limits  RESP PANEL BY RT-PCR (FLU A&B, COVID) ARPGX2  MAGNESIUM  TROPONIN I (HIGH SENSITIVITY)  TROPONIN I (HIGH SENSITIVITY)    EKG EKG Interpretation  Date/Time:  Monday December 27 2020 13:28:25 EDT Ventricular Rate:  136 PR Interval:    QRS Duration: 97 QT Interval:  318 QTC Calculation: 479 R  Axis:   79 Text Interpretation: Atrial fibrillation with rapid V-rate Borderline low voltage, extremity leads Atrial fibrillation with RVR Confirmed by Coralee Pesa [phone]) on 12/27/2020 2:09:58 PM  Radiology DG Chest Portable 1 View  Result Date: 12/27/2020 CLINICAL DATA:  Dyspnea with exertion. EXAM: PORTABLE CHEST 1 VIEW COMPARISON:  November 24, 2020. FINDINGS: Stable cardiomegaly. No pneumothorax is noted. Increased bilateral perihilar and basilar opacities are noted concerning for pneumonia or possibly edema. Bony thorax is unremarkable. IMPRESSION: Increased bilateral lung opacities as described above. Electronically Signed   By: Lupita Raider M.D.   On: 12/27/2020 14:00    Procedures .Critical Care  Date/Time: 12/28/2020 4:53 PM Performed by: Rozelle Logan, DO Authorized by: Rozelle Logan, DO   Critical care provider statement:    Critical care time (minutes):  45   Critical care was necessary to treat or prevent imminent or life-threatening deterioration of the following conditions:  Respiratory failure and cardiac failure   Critical care was time spent personally by me on the following activities:  Discussions with consultants, evaluation of patient's response to treatment, examination of patient, ordering and performing treatments and interventions, ordering and review of laboratory studies, ordering and review of radiographic studies, pulse oximetry, re-evaluation of patient's condition, obtaining history from patient or surrogate and review of old charts   I assumed direction of critical care for this patient from another provider in my specialty: no     Care discussed with: admitting provider     Medications Ordered in ED Medications  apixaban (ELIQUIS) tablet 5 mg (5 mg Oral Given 12/27/20 1615)  diltiazem (CARDIZEM) injection 20 mg (20 mg Intravenous Given 12/27/20 1427)  furosemide (LASIX) injection 40 mg (40 mg Intravenous Given 12/27/20 1615)  diltiazem (CARDIZEM)  tablet 30 mg (30 mg Oral Given 12/27/20 1615)    ED Course  I have reviewed the triage vital signs and the nursing notes.  Pertinent labs & imaging results that were available during my care of the patient were reviewed by me and considered in my medical decision making (see chart for details).    MDM Rules/Calculators/A&P                           80 year old male presents emergency department shortness of breath, hypoxic on room air.  Comfortable on 2 L nasal cannula, EKG shows atrial fibrillation with RVR, blood pressure is stable, no active chest pain.  Patient is status postcardiac ablation for previous episode of atrial fibrillation.  He is anticoagulant on Eliquis however missed his morning dose.  He has rales bilaterally and looks fluid overloaded.  Chest x-ray shows bilateral findings consistent with congestion, less likely opacity.  He denies any productive cough, fever.  Blood work is reassuring with negative troponins, BNP elevated.  After dose of IV Cardizem and oral he is currently rate controlled.  Spoke with the electrophysiology service, they state that patient would be planned for cardioversion.  However in his hypoxic and fluid overloaded state with missed anticoagulation dose plan for medical admission, optimization and cardioversion as an inpatient.  Patients evaluation and results requires admission for further treatment and care. Patient agrees with admission plan, offers no  new complaints and is stable/unchanged at time of admit.  Final Clinical Impression(s) / ED Diagnoses Final diagnoses:  Atrial fibrillation with RVR (HCC)  Heart failure, unspecified HF chronicity, unspecified heart failure type [facility])    Rx / DC Orders ED Discharge Orders     None        Rozelle Logan, DO 12/27/20 1632    Benen Weida, Clabe Seal, DO 12/28/20 1653

## 2020-12-27 NOTE — Consult Note (Deleted)
  Entered in error. See H&P note   

## 2020-12-27 NOTE — ED Notes (Signed)
Gave report to [employer] on [facility]

## 2020-12-28 ENCOUNTER — Inpatient Hospital Stay: Payer: Self-pay

## 2020-12-28 ENCOUNTER — Encounter (HOSPITAL_COMMUNITY): Payer: Self-pay | Admitting: Internal Medicine

## 2020-12-28 ENCOUNTER — Inpatient Hospital Stay (HOSPITAL_COMMUNITY): Payer: Medicare Other | Admitting: Certified Registered Nurse Anesthetist

## 2020-12-28 ENCOUNTER — Inpatient Hospital Stay (HOSPITAL_COMMUNITY): Payer: Medicare Other

## 2020-12-28 ENCOUNTER — Encounter (HOSPITAL_COMMUNITY)
Admission: EM | Disposition: A | Discharge status: Home Health | Payer: Self-pay | Source: Home / Self Care | Attending: Cardiology

## 2020-12-28 DIAGNOSIS — I5021 Acute systolic (congestive) heart failure: Secondary | ICD-10-CM

## 2020-12-28 DIAGNOSIS — U071 COVID-19: Secondary | ICD-10-CM | POA: Diagnosis not present

## 2020-12-28 DIAGNOSIS — I4891 Unspecified atrial fibrillation: Secondary | ICD-10-CM

## 2020-12-28 DIAGNOSIS — I4819 Other persistent atrial fibrillation: Secondary | ICD-10-CM

## 2020-12-28 DIAGNOSIS — I5043 Acute on chronic combined systolic (congestive) and diastolic (congestive) heart failure: Secondary | ICD-10-CM | POA: Diagnosis not present

## 2020-12-28 DIAGNOSIS — J1282 Pneumonia due to coronavirus disease 2019: Secondary | ICD-10-CM

## 2020-12-28 HISTORY — PX: CARDIOVERSION: SHX1299

## 2020-12-28 LAB — BASIC METABOLIC PANEL
Anion gap: 11 (ref 5–15)
BUN: 19 mg/dL (ref 8–23)
CO2: 25 mmol/L (ref 22–32)
Calcium: 8.5 mg/dL — ABNORMAL LOW (ref 8.9–10.3)
Chloride: 103 mmol/L (ref 98–111)
Creatinine, Ser: 1.44 mg/dL — ABNORMAL HIGH (ref 0.61–1.24)
GFR, Estimated: 49 mL/min — ABNORMAL LOW (ref >60)
Glucose, Bld: 179 mg/dL — ABNORMAL HIGH (ref 70–99)
Potassium: 3.5 mmol/L (ref 3.5–5.1)
Sodium: 139 mmol/L (ref 135–145)

## 2020-12-28 LAB — ECHOCARDIOGRAM COMPLETE
AR max vel: 0.96 cm2
AV Area VTI: 0.9 cm2
AV Area mean vel: 0.9 cm2
AV Mean grad: 14.3 mmHg
AV Peak grad: 26.1 mmHg
Ao pk vel: 2.56 m/s
Area-P 1/2: 3.66 cm2
Height: 74 in
MV M vel: 5.02 m/s
MV Peak grad: 100.8 mmHg
MV VTI: 1.5 cm2
Radius: 0.4 cm
S' Lateral: 3.3 cm
Weight: 3950.64 oz

## 2020-12-28 LAB — HEPATIC FUNCTION PANEL
ALT: 54 U/L — ABNORMAL HIGH (ref 0–44)
AST: 46 U/L — ABNORMAL HIGH (ref 15–41)
Albumin: 2.2 g/dL — ABNORMAL LOW (ref 3.5–5.0)
Alkaline Phosphatase: 67 U/L (ref 38–126)
Bilirubin, Direct: 0.2 mg/dL (ref 0.0–0.2)
Indirect Bilirubin: 1.1 mg/dL — ABNORMAL HIGH (ref 0.3–0.9)
Total Bilirubin: 1.3 mg/dL — ABNORMAL HIGH (ref 0.3–1.2)
Total Protein: 6.7 g/dL (ref 6.5–8.1)

## 2020-12-28 LAB — GLUCOSE, CAPILLARY
Glucose-Capillary: 220 mg/dL — ABNORMAL HIGH (ref 70–99)
Glucose-Capillary: 150 mg/dL — ABNORMAL HIGH (ref 70–99)
Glucose-Capillary: 260 mg/dL — ABNORMAL HIGH (ref 70–99)
Glucose-Capillary: 226 mg/dL — ABNORMAL HIGH (ref 70–99)

## 2020-12-28 LAB — C-REACTIVE PROTEIN: CRP: 25.7 mg/dL — ABNORMAL HIGH (ref <1.0)

## 2020-12-28 LAB — PROCALCITONIN: Procalcitonin: <0.1 ng/mL

## 2020-12-28 SURGERY — CARDIOVERSION
Anesthesia: General

## 2020-12-28 MED ORDER — CHLORHEXIDINE GLUCONATE CLOTH 2 % EX PADS
6.0000 | MEDICATED_PAD | Freq: Every day | CUTANEOUS | Status: DC
  Administered 2020-12-28 – 2020-12-30 (×3): 6 via TOPICAL

## 2020-12-28 MED ORDER — BARICITINIB 2 MG PO TABS
2.0000 mg | ORAL_TABLET | Freq: Every day | ORAL | Status: DC
  Administered 2020-12-28 – 2020-12-31 (×4): 2 mg via ORAL
  Filled 2020-12-28 (×4): qty 1

## 2020-12-28 MED ORDER — LIDOCAINE 2% (20 MG/ML) 5 ML SYRINGE
INTRAMUSCULAR | Status: DC | PRN
  Administered 2020-12-28: 100 mg via INTRAVENOUS

## 2020-12-28 MED ORDER — ASCORBIC ACID 500 MG PO TABS
500.0000 mg | ORAL_TABLET | Freq: Every day | ORAL | Status: DC
  Administered 2020-12-28 – 2020-12-31 (×4): 500 mg via ORAL
  Filled 2020-12-28 (×4): qty 1

## 2020-12-28 MED ORDER — SODIUM CHLORIDE 0.9 % IV SOLN
200.0000 mg | Freq: Once | INTRAVENOUS | Status: AC
  Administered 2020-12-28: 200 mg via INTRAVENOUS
  Filled 2020-12-28: qty 40

## 2020-12-28 MED ORDER — PROPOFOL 10 MG/ML IV BOLUS
INTRAVENOUS | Status: DC | PRN
  Administered 2020-12-28: 70 mg via INTRAVENOUS

## 2020-12-28 MED ORDER — POTASSIUM CHLORIDE CRYS ER 20 MEQ PO TBCR
40.0000 meq | EXTENDED_RELEASE_TABLET | Freq: Four times a day (QID) | ORAL | Status: AC
Start: 2020-12-28 — End: 2020-12-28
  Administered 2020-12-28: 40 meq via ORAL
  Filled 2020-12-28: qty 2

## 2020-12-28 MED ORDER — SODIUM CHLORIDE 0.9% FLUSH: 10.0000 mL | INTRAVENOUS | Status: DC | PRN

## 2020-12-28 MED ORDER — REMDESIVIR 100 MG IV SOLR
100.0000 mg | Freq: Every day | INTRAVENOUS | Status: AC
  Administered 2020-12-29 – 2020-12-31 (×3): 100 mg via INTRAVENOUS
  Filled 2020-12-28 (×3): qty 100

## 2020-12-28 MED ORDER — SODIUM CHLORIDE 0.9% FLUSH
10.0000 mL | Freq: Two times a day (BID) | INTRAVENOUS | Status: DC
  Administered 2020-12-28 – 2020-12-31 (×6): 10 mL

## 2020-12-28 MED ORDER — PHENYLEPHRINE 40 MCG/ML (10ML) SYRINGE FOR IV PUSH (FOR BLOOD PRESSURE SUPPORT)
PREFILLED_SYRINGE | INTRAVENOUS | Status: DC | PRN
  Administered 2020-12-28 (×2): 80 ug via INTRAVENOUS

## 2020-12-28 MED ORDER — FUROSEMIDE 10 MG/ML IJ SOLN
80.0000 mg | Freq: Two times a day (BID) | INTRAMUSCULAR | Status: DC
  Administered 2020-12-28 (×2): 80 mg via INTRAVENOUS
  Filled 2020-12-28 (×2): qty 8

## 2020-12-28 MED ORDER — DEXAMETHASONE SODIUM PHOSPHATE 10 MG/ML IJ SOLN
6.0000 mg | INTRAMUSCULAR | Status: DC
  Administered 2020-12-28 – 2020-12-29 (×2): 6 mg via INTRAVENOUS
  Filled 2020-12-28 (×3): qty 0.6

## 2020-12-28 MED ORDER — ZINC SULFATE 220 (50 ZN) MG PO CAPS
220.0000 mg | ORAL_CAPSULE | Freq: Every day | ORAL | Status: DC
  Administered 2020-12-29 – 2020-12-31 (×3): 220 mg via ORAL
  Filled 2020-12-28 (×3): qty 1

## 2020-12-28 MED ORDER — GUAIFENESIN-DM 100-10 MG/5ML PO SYRP: 10.0000 mL | ORAL_SOLUTION | ORAL | Status: DC | PRN

## 2020-12-28 MED ORDER — IPRATROPIUM-ALBUTEROL 20-100 MCG/ACT IN AERS
1.0000 | INHALATION_SPRAY | Freq: Four times a day (QID) | RESPIRATORY_TRACT | Status: DC
  Administered 2020-12-28 – 2020-12-29 (×2): 1 via RESPIRATORY_TRACT
  Filled 2020-12-28: qty 4

## 2020-12-28 MED ORDER — HYDROCOD POLST-CPM POLST ER 10-8 MG/5ML PO SUER: 5.0000 mL | Freq: Two times a day (BID) | ORAL | Status: DC | PRN

## 2020-12-28 NOTE — Progress Notes (Signed)
Echo attempted. Per nurse, patient getting PICC line and would be at least 30 minutes. Will attempt again later as time and schedule permits.

## 2020-12-28 NOTE — Plan of Care (Signed)
  Problem: Education: Goal: Knowledge of General Education information will improve Description: Including pain rating scale, medication(s)/side effects and non-pharmacologic comfort measures Outcome: Progressing   Problem: Health Behavior/Discharge Planning: Goal: Ability to manage health-related needs will improve Outcome: Progressing   Problem: Clinical Measurements: Goal: Ability to maintain clinical measurements within normal limits will improve Outcome: Progressing Goal: Will remain free from infection Outcome: Progressing Goal: Diagnostic test results will improve Outcome: Progressing Goal: Respiratory complications will improve Outcome: Progressing Goal: Cardiovascular complication will be avoided Outcome: Progressing   Problem: Activity: Goal: Risk for activity intolerance will decrease Outcome: Progressing   Problem: Nutrition: Goal: Adequate nutrition will be maintained Outcome: Progressing   Problem: Coping: Goal: Level of anxiety will decrease Outcome: Progressing   Problem: Elimination: Goal: Will not experience complications related to bowel motility Outcome: Progressing Goal: Will not experience complications related to urinary retention Outcome: Progressing   Problem: Pain Managment: Goal: General experience of comfort will improve Outcome: Progressing   Problem: Safety: Goal: Ability to remain free from injury will improve Outcome: Progressing   Problem: Skin Integrity: Goal: Risk for impaired skin integrity will decrease Outcome: Progressing   Problem: Education: Goal: Knowledge of disease or condition will improve Outcome: Progressing Goal: Understanding of medication regimen will improve Outcome: Progressing Goal: Individualized Educational Video(s) Outcome: Progressing   Problem: Activity: Goal: Ability to tolerate increased activity will improve Outcome: Progressing   Problem: Cardiac: Goal: Ability to achieve and maintain  adequate cardiopulmonary perfusion will improve Outcome: Progressing   Problem: Health Behavior/Discharge Planning: Goal: Ability to safely manage health-related needs after discharge will improve Outcome: Progressing   Problem: Education: Goal: Ability to demonstrate management of disease process will improve Outcome: Progressing Goal: Ability to verbalize understanding of medication therapies will improve Outcome: Progressing Goal: Individualized Educational Video(s) Outcome: Progressing   Problem: Activity: Goal: Capacity to carry out activities will improve Outcome: Progressing   Problem: Cardiac: Goal: Ability to achieve and maintain adequate cardiopulmonary perfusion will improve Outcome: Progressing   

## 2020-12-28 NOTE — Transfer of Care (Signed)
Immediate Anesthesia Transfer of Care Note  Patient: David Huynh  Procedure(s) Performed: CARDIOVERSION  Patient Location: Endoscopy Unit  Anesthesia Type:General  Level of Consciousness: awake, alert  and oriented  Airway & Oxygen Therapy: Patient Spontanous Breathing and Patient connected to face mask oxygen  Post-op Assessment: Report given to RN and Post -op Vital signs reviewed and stable  Post vital signs: Reviewed and stable  Last Vitals:  Vitals Value Taken Time  BP 91/55   Temp    Pulse 69   Resp 21   SpO2 94     Last Pain:  Vitals:   12/28/20 1221  TempSrc: Temporal  PainSc: 0-No pain         Complications: No notable events documented.

## 2020-12-28 NOTE — Interval H&P Note (Signed)
History and Physical Interval Note:  12/28/2020 12:48 PM  David Huynh  has presented today for surgery, with the diagnosis of afib.  The various methods of treatment have been discussed with the patient and family. After consideration of risks, benefits and other options for treatment, the patient has consented to  Procedure(s): CARDIOVERSION (N/A) as a surgical intervention.  The patient's history has been reviewed, patient examined, no change in status, stable for surgery.  I have reviewed the patient's chart and labs.  Questions were answered to the patient's satisfaction.     [name] [employer]

## 2020-12-28 NOTE — Procedures (Signed)
Electrical Cardioversion Procedure Note David Huynh [id number] Apr 05, 1941  Procedure: Electrical Cardioversion Indications:  Atrial Fibrillation  Procedure Details Consent: Risks of procedure as well as the alternatives and risks of each were explained to the (patient/caregiver).  Consent for procedure obtained. Time Out: Verified patient identification, verified procedure, site/side was marked, verified correct patient position, special equipment/implants available, medications/allergies/relevent history reviewed, required imaging and test results available.  Performed  Patient placed on cardiac monitor, pulse oximetry, supplemental oxygen as necessary.  Sedation given:  Propofol per anesthesiology Pacer pads placed anterior and posterior chest.  Cardioverted 2 times, the second time with sternal pressure.  Cardioverted at 200J.  Evaluation Findings: Post procedure EKG shows: NSR Complications: None Patient did tolerate procedure well.   David Huynh 12/28/2020, 12:58 PM

## 2020-12-28 NOTE — TOC Initial Note (Addendum)
Transition of Care [facility]) - Initial/Assessment Note    Patient Details  Name: David Huynh MRN: [id number] Date of Birth: May 11, 1941  [facility] of [facility]) CM/SW Contact:    Elliot Cousin, RN Phone Number:[phone] 12/28/2020, 5:21 PM  Clinical Narrative:                 TOC CM spoke to pt's wife. And states pt was independent prior to admission. He uses his Rollator at home. She agreeable to [facility] PT. Waiting PT recommendations. She can drive him to appts.   Expected Discharge Plan: Home w Home Health Services Barriers to Discharge: Continued Medical Work up   Patient Goals and CMS Choice Patient states their goals for this hospitalization and ongoing recovery are:: feels pt will be able to get around like before CMS Medicare.gov Compare Post Acute Care list provided to:: Patient Represenative (must comment) (wife-David Huynh) Choice offered to / list presented to : Spouse  Expected Discharge Plan and Services Expected Discharge Plan: Home w Home Health Services In-house Referral: Clinical Social Work Discharge Planning Services: CM Consult Post Acute Care Choice: Home Health Living arrangements for the past 2 months: Single Family Home                                      Prior Living Arrangements/Services Living arrangements for the past 2 months: Single Family Home Lives with:: Spouse Patient language and need for interpreter reviewed:: Yes Do you feel safe going back to the place where you live?: Yes      Need for Family Participation in Patient Care: Yes (Comment) Care giver support system in place?: Yes (comment) Current home services: DME (rollator, bedside commode) Criminal Activity/Legal Involvement Pertinent to Current Situation/Hospitalization: No - Comment as needed  Activities of Daily Living Home Assistive Devices/Equipment: CBG Meter, Walker (specify type) ADL Screening (condition at time of admission) Patient's cognitive ability adequate  to safely complete daily activities?: Yes Is the patient deaf or have difficulty hearing?: No Does the patient have difficulty seeing, even when wearing glasses/contacts?: No Does the patient have difficulty concentrating, remembering, or making decisions?: No Patient able to express need for assistance with ADLs?: Yes Does the patient have difficulty dressing or bathing?: No Independently performs ADLs?: Yes (appropriate for developmental age) Does the patient have difficulty walking or climbing stairs?: No Weakness of Legs: None Weakness of Arms/Hands: None  Permission Sought/Granted Permission sought to share information with : Case Manager, PCP, Family Supports Permission granted to share information with : Yes, Verbal Permission Granted  Share Information with NAME: Arliss Frisina  Permission granted to share info w AGENCY: Home Health  Permission granted to share info w Relationship: wife  Permission granted to share info w Contact Information: [phone]  Emotional Assessment           Psych Involvement: No (comment)  Admission diagnosis:  Atrial fibrillation with RVR (HCC) [I48.91] Acute on chronic combined systolic and diastolic CHF (congestive heart failure) (HCC) [I50.43] Heart failure, unspecified HF chronicity, unspecified heart failure type [facility]) [I50.9] Patient Active Problem List   Diagnosis Date Noted   Secondary hypercoagulable state (HCC) 11/23/2020   Hyperkalemia 09/05/2020   Diastolic CHF (HCC) 08/18/2020   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   Acute CHF (congestive heart failure) (HCC) 08/10/2020   Syncope and collapse 08/09/2020   Acute-on-chronic kidney injury (HCC) 08/09/2020   Type 2 diabetes mellitus  with diabetic polyneuropathy, without long-term current use of insulin (HCC) 02/24/2020   Acute on chronic combined systolic (congestive) and diastolic (congestive) heart failure (HCC) 12/06/2019   Acute hypoxemic respiratory failure (HCC)  12/06/2019   Persistent atrial fibrillation (HCC) 03/25/2018   CAD in native artery    Aortic stenosis, mild 06/28/2016   Cardiomyopathy, ischemic 02/17/2016   Acute on chronic combined systolic and diastolic CHF (congestive heart failure) (HCC) 02/17/2016   Dyspnea    Hypertensive heart disease    Hypocalcemia 02/12/2015   Fracture of multiple ribs 02/12/2015   Other specified forms of effusion, except tuberculous    Essential hypertension    Other and unspecified hyperlipidemia    Liver function study, abnormal 04/26/2011   Obesity    Hyperlipidemia with target LDL less than 70    Mild persistent asthma    Allergic state    PCP:  Bradd Canary, MD Pharmacy:   [facility] [address] [place] - [address] AT [facility] [address] [place] [address] Phone: [phone] Fax: [phone]     Social Determinants of Health (SDOH) Interventions    Readmission Risk Interventions Readmission Risk Prevention Plan 08/20/2020  Transportation Screening Complete  PCP or Specialist Appt within 3-5 Days Complete  HRI or Home Care Consult Complete  Social Work Consult for Recovery Care Planning/Counseling Complete  Palliative Care Screening Not Applicable  Medication Review Oceanographer) Complete  Some recent data might be hidden

## 2020-12-28 NOTE — Progress Notes (Addendum)
Advanced Heart Failure Rounding Note  PCP-Cardiologist: [name], MD   Patient Profile    80 y/o male w/ h/o CAD, difficult to control Afib w/ prior failed DCCVs and recent Afib ablation 6/22 w/ recurrence, h/o amiodarone toxicity, h/o systolic heart failure felt to be tachymediated w/ improved EF 25-30%>>50-55% and stage III CKD, admitted for a/c CHF in the setting of recurrent Afib w/ RVR.   In ED, found to be COVID +.  Bedside echo showed EF 40-45% with mild to moderate AS    Subjective:    Increase in O2 requirements overnight, now on HFNC at 12L/min. Admit CXR w/ increased bilateral perihilar and basilar opacities.   AF. WBC 11.5. Denies cough.   Responding well to IV Lasix. Wt down 7 lb.   SCr 1.57>>1.44 K 3.5   Remains in Afib, HR improved some on Cardizem gtt, currently in the low 100s-110s   Objective:   Weight Range: 112 kg Body mass index is 31.7 kg/m.   Vital Signs:   Temp:  [98 F (36.7 C)-98.4 F (36.9 C)] 98 F (36.7 C) (08/16 0340) Pulse Rate:  [43-120] 100 (08/16 0340) Resp:  [18-33] 21 (08/16 0340) BP: (94-132)/(54-97) 119/77 (08/16 0340) SpO2:  [88 %-93 %] 92 % (08/16 0723) Weight:  [112 kg-122.5 kg] 112 kg (08/16 0553) Last BM Date: 12/27/20  Weight change: Filed Weights   12/27/20 1331 12/27/20 2011 12/28/20 0553  Weight: 122.5 kg 115.1 kg 112 kg    Intake/Output:   Intake/Output Summary (Last 24 hours) at 12/28/2020 0747 Last data filed at 12/28/2020 0700 Gross per 24 hour  Intake 79.22 ml  Output 1675 ml  Net -1595.78 ml      Physical Exam    General:  Well appearing but on HFNC HEENT: Normal Neck: Supple. JVP not well visualized . Carotids 2+ bilat; no bruits. No lymphadenopathy or thyromegaly appreciated. Cor: PMI nondisplaced. Irregularly irregular rhythm, tachy rate. No rubs, gallops or murmurs. Lungs: Clear Abdomen: Soft, nontender, nondistended. No hepatosplenomegaly. No bruits or masses. Good bowel  sounds. Extremities: No cyanosis, clubbing, rash, edema Neuro: Alert & orientedx3, cranial nerves grossly intact. moves all 4 extremities w/o difficulty. Affect pleasant   Telemetry   Afib, low 100s-110s   EKG    No new EKG to review   Labs    CBC Recent Labs    12/27/20 1336  WBC 11.5*  HGB 12.2*  HCT 38.8*  MCV 93.9  PLT 506*   Basic Metabolic Panel Recent Labs    58/09/98 1336 12/27/20 1409 12/28/20 0232  NA 135  --  139  K 4.0  --  3.5  CL 102  --  103  CO2 22  --  25  GLUCOSE 263*  --  179*  BUN 22  --  19  CREATININE 1.57*  --  1.44*  CALCIUM 8.5*  --  8.5*  MG  --  2.3  --    Liver Function Tests No results for input(s): AST, ALT, ALKPHOS, BILITOT, PROT, ALBUMIN in the last 72 hours. No results for input(s): LIPASE, AMYLASE in the last 72 hours. Cardiac Enzymes No results for input(s): CKTOTAL, CKMB, CKMBINDEX, TROPONINI in the last 72 hours.  BNP: BNP (last 3 results) Recent Labs    08/31/20 1547 09/28/20 1225 12/27/20 1409  BNP 124.8* 360.9* 502.6*    ProBNP (last 3 results) No results for input(s): PROBNP in the last 8760 hours.   D-Dimer No results for input(s): DDIMER in  the last 72 hours. Hemoglobin A1C No results for input(s): HGBA1C in the last 72 hours. Fasting Lipid Panel No results for input(s): CHOL, HDL, LDLCALC, TRIG, CHOLHDL, LDLDIRECT in the last 72 hours. Thyroid Function Tests No results for input(s): TSH, T4TOTAL, T3FREE, THYROIDAB in the last 72 hours.  Invalid input(s): FREET3  Other results:   Imaging    DG Chest Portable 1 View  Result Date: 12/27/2020 CLINICAL DATA:  Dyspnea with exertion. EXAM: PORTABLE CHEST 1 VIEW COMPARISON:  November 24, 2020. FINDINGS: Stable cardiomegaly. No pneumothorax is noted. Increased bilateral perihilar and basilar opacities are noted concerning for pneumonia or possibly edema. Bony thorax is unremarkable. IMPRESSION: Increased bilateral lung opacities as described above.  Electronically Signed   By: Lupita Raider M.D.   On: 12/27/2020 14:00     Medications:     Scheduled Medications:  apixaban  5 mg Oral BID   carvedilol  12.5 mg Oral BID WC   digoxin  0.125 mg Oral Daily   empagliflozin  25 mg Oral QAC breakfast   ezetimibe  10 mg Oral Daily   furosemide  40 mg Intravenous Q12H   furosemide  80 mg Intravenous BID   icosapent Ethyl  2 g Oral BID   insulin aspart  0-20 Units Subcutaneous TID WC   pantoprazole  40 mg Oral Daily   predniSONE  5 mg Oral Q breakfast   ranolazine  500 mg Oral BID   rosuvastatin  40 mg Oral Daily   sacubitril-valsartan  1 tablet Oral BID   sodium chloride flush  3 mL Intravenous Q12H    Infusions:  sodium chloride     diltiazem (CARDIZEM) infusion 5 mg/hr (12/28/20 0700)    PRN Medications: sodium chloride, acetaminophen, nitroGLYCERIN, sodium chloride flush    Assessment/Plan   1. Recurrent Atrial Fibrillation w/ RVR: Had successful DC-CV on 03/29/18 but then back in atrial fibrillation. Ranolazine added, but failed DCCV again 04/02/18. DCCV 04/19/18 was successful.  AF with RVR at 3/22 admission, amiodarone restarted and he was cardioverted to NSR, but he developed amiodarone lung toxicity.  Amiodarone was stopped.  He had subsequently gone back into rate-controlled atrial fibrillation. Underwent Afib Ablation 6/22. Now w/ recurrent PAF w/ RVR.  - Plan DCCV today (denies missed doses of Eliquis)  - Continue Eliquis 5 mg bid - Continue Coreg 12.5 mg bid - Continue ranolazine 500 mg bid  - No amiodarone with history of lung toxicity.   - ? Tikosyn. Will d/w EP - C/w short term Cardizem gtt for rate control at 5 mg/hr  - C/w digoxin 0.125 mg daily  - Keep K > 4.0 and Mg >2.0  2. Acute on Chronic systolic=>diastolic CHF: Prior ischemic cardiomyopathy, improved after PCI in 2017.  Echo 03/2018 with EF back down to 35-40% and moderate-severe RV dysfunction. This was in the setting of atrial fibrillation with RVR.  EF 40% on TEE 04/2018.  Echo was in 3/20, showing EF up to 50%.  Suspect that he primarily had a tachycardia-mediated CMP at that point.  Echo in 7/21 with EF 25-30%, severe RV dysfunction.  TEE in 10/21, however, showed EF up to 50%.  He had PCI to LAD in 10/21. Echo in 3/22 showed stable EF 50-55% with normal RV.  TEE 6/22 at time of Afib ablation showed stable EF 50-55% w/ normal RV. Now w/ acute on chronic CHF in setting of recurrent afib w/ RVR. Mildly fluid overloaded. NYHA Class II-III. Bedside Echo shows mildly  reduced EF now 40-45% - C/w IV Lasix 80 mg bid - Place PICC line for CVP monitoring  - Plan DCCV today - Continue Entresto to 24/26 bid (dose recently reduced given improved EF and bump in SCr) - Continue Coreg 12.5 mg bid (did not tolerate 25 mg dose due to low BP and dizziness)  - Continue Jardiance 25 mg daily  - Continue digoxin 0.125 mg daily  - repeat 2D Echo  3. CKD: Stage 3.  SCr 1.57 on admit c/w baseline. Stable at 1.4 today  - follow BMP w/ diuresis  4. CAD: History of DES to RCA in 9/17.  Underwent LHC 04/03/18 with PCI to prox left circ. LHC in 10/21 with PCI to LAD.   - He is on Eliquis so no ASA.   - He is now off Plavix (finished 6 month therapy)  - Continue statin.  5. DM2: on Jardiance  6. Aortic stenosis: Mild on 3/22 echo.  7. Mitral regurgitation: Mild on 3/22 echo.  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 He is off amiodarone.  He improved with steroids.   - Continue steroid taper per pulmonary, he is followed with pulmonary.  - Bactrim for PJP prophylaxis  9. Acute on Chronic Hypoxic Respiratory Failure  - h/o amio toxicity, remains on steroid taper (prednisone 5 mg/day).  - hypoxia exacerbated by a/c CHF, COVID 19 infection + Afib w/ RVR - c/w diuresis - ? Remdesivir. Consult [facility]  - wean HFNC as tolerated  10. COIVD 19 Infection  - CXR w/ ? PNA. High supp O2 requirements   - ? Remdesivir. Consult [facility] for further management   Length of  Stay: 1  Brittainy Delmer Islam  12/28/2020, 7:47 AM  Advanced Heart Failure Team Pager [phone] (M-F; 7a - 5p)  Please contact [facility] for night-coverage after hours (5p -7a ) and weekends on [email or web address]  Patient seen with PA, agree with the above note.   Good diuresis yesterday, I/Os net negative 1596.  He says that he feels better today.  He is on 12 L HFNC this morning, I turned down to 8 L without problems.   He remains in atrial fibrillation, rate 90s-100s on diltiazem gtt 5 mg/hr.   He is COVID-19+.   General: NAD Neck: JVP 10 cm, no thyromegaly or thyroid nodule.  Lungs: Mild crackles at bases.  CV: Nondisplaced PMI.  Heart irregular S1/S2, no S3/S4, 2/6 SEM RUSB.  No peripheral edema.   Abdomen: Soft, nontender, no hepatosplenomegaly, no distention.  Skin: Intact without lesions or rashes.  Neurologic: Alert and oriented x 3.  Psych: Normal affect. Extremities: No clubbing or cyanosis.  HEENT: Normal.   Patient was admitted with atrial fibrillation/RVR + CHF exacerbation in setting of COVID-19 infection.   Atrial fibrillation: HR under better control today.  With low EF (40-45% on 6/22 TEE), will stop diltiazem gtt after he gets am digoxin and Coreg.  He has not missed Eliquis doses and we are titrating down his oxygen, plan for DCCV this afternoon . Discussed risks/benefits with him and he agrees to procedure.  Continue ranolazine, not amiodarone candidate due to toxicity.  Has had afib ablation.  Possible Tikosyn candidate in future if continues to have issues with AF.   Acute on chronic systolic CHF: TEE in 6/22 with EF 40-45%.  Admitted with volume overload, CXR with bilateral lung opacities (CHF vs COVID-19).   - Continue Lasix 80 mg IV bid today, will place PICC to follow CVP given confounding  COVID-19 infection.  - Continue home Coreg, empagliflozin, and Entresto.  - Repeat echo.  - Creatinine stable, CKD stage 3.   COVID-19 infection: This may contribute  to oxygen requirement (he is on more oxygen than I would expect with his CHF exacerbation though we are now titrating down).  - Continue to titrate down oxygen, hopefully stable for DCCV this afternoon.  - ?Remdesivir treatment, will ask for [facility] to consult re: COVID-19.   H/o amiodarone lung toxicity, he is now down to prednisone 5 mg daily (taper per outpatient pulmonary).   [name] 12/28/2020 8:39 AM

## 2020-12-28 NOTE — Progress Notes (Signed)
  Echocardiogram 2D Echocardiogram has been performed.  David Huynh 12/28/2020, 3:35 PM

## 2020-12-28 NOTE — Consult Note (Signed)
[facility] Medical Consultation  David Huynh VXY:[id number] DOB: 11/15/1940 DOA: 12/27/2020 PCP: [name], MD   Requesting physician: Loralie Champagne, MD Date of consultation: 12/28/2020 Reason for consultation: COVID-19 infection  Impression/Recommendations Active Problems:   Acute on chronic combined systolic and diastolic CHF (congestive heart failure) ([place])    Recurrent atrial fibrillation with RVR: Patient with prior lung toxicity secondary to amiodarone.  Currently on Coreg, digoxin, and Eliquis. -Per cardiology  Acute on chronic combined systolic congestive heart failure: Last echocardiogram noted EF of 50 to 55%. -Diuresis per cardiology  Acute respiratory failure with hypoxia: Patient currently on high flow nasal oxygen maintain O2 saturation greater then 90%.  Suspect likely multifactorial in the setting of CHF and/or pneumonia secondary to COVID-19. -Continuous pulse oximetry with oxygen to maintain O2 saturation greater 92%  Pneumonia due to COVID-19: Patient was noted to test positive for COVID-19.  Chest x-ray noting bilateral airspace opacities despite IV diuretics -COVID-19 order set utilized -Check inflammatory markers including procalcitonin -Check sputum culture has met SIRS criteria and has been on chronic steroid -Combivent inhaler -Remdesivir per pharmacy day 1 of 5 -Baricitinib day 1 of 14 -Decadron 6 mg IV daily -Vitamin C and zinc -Antitussives as needed -Consider   CAD -Continue statin  Diabetes mellitus type 2: Hemoglobin A1c noted to be 10 on 11/08/2020.  Patient had recently been on steroids for amiodarone induced lung toxicity. -Hypoglycemic protocols -Continue GRN -Continue CBGs with SSI -Adjust regimen as needed  Amiodarone Induced lung toxicity -Followed in outpatient setting by pulmonology  CKD stage IIIa -Continue to monitor kidney function with diuresis   I will followup again tomorrow. Please contact me if I can  be of assistance in the meanwhile. Thank you for this consultation.  Chief Complaint: Shortness of breath  HPI:  David Huynh is a 80 y.o. male with medical history significant of hypertension, hyperlipidemia, systolic CHF, atrial fibrillation, CAD, CKD stage III, and amiodarone lung toxicity who presents with progressive shortness of breath starting 2-3 days ago.  Notes associated symptoms of a nonproductive cough.  Patient had underwent ablation with Dr. Rayann Heman in June for atrial fibrillation, but reports that he goes in and out of atrial fibrillation still.  Symptoms were thought to be secondary to a CHF exacerbation.  He reported last testing negative for COVID-19 with that home test on 8/13.  Labs significant for BNP of 506.  Chest x-ray noted cardiomegaly with increased bilateral perihilar basilar opacities.  COVID-19 screening was noted to be positive.  He has been vaccinated and boosted twice against COVID-19.  Patient was started on IV diuretics with good diuresis, but patient requiring high flow nasal cannula oxygen.   Review of Systems  Respiratory:  Positive for cough and shortness of breath. Negative for sputum production.   Cardiovascular:  Positive for palpitations.    Past Medical History:  Diagnosis Date   Allergy    seasonal   Asthma    mild, intermittent   Atrial fibrillation with rapid ventricular response ([place]) 03/25/2018   CAD, multiple vessel    Three-vessel disease involving mid RCA 85% (PCI with DES), OM 2 80% in branch and ostD1 ~90%. Only the mid RCA with PCI target.    Carotid bruit 08/05/2010   Bruit noted on left    Chronic combined systolic and diastolic CHF (congestive heart failure) (H[facility])    CKD (chronic kidney disease), stage III ([facility])    Diabetes mellitus    type 2  Fracture of multiple ribs 02/12/2015   Hemothorax on right 01/23/2015   Hyperlipidemia    Hypertension    Hypocalcemia 02/12/2015   Lactic acidosis 03/25/2018   Liver function study,  abnormal 04/26/2011   Multiple rib fractures    Obesity    Persistent atrial fibrillation ([place])    Pneumonia    Past Surgical History:  Procedure Laterality Date   [place] N/A 10/26/2020   Procedure: ATRIAL FIBRILLATION ABLATION;  Surgeon: Thompson Grayer, MD;  Location: [place] [facility];  Service: Cardiovascular;  Laterality: N/A;   CARDIAC CATHETERIZATION N/A 02/17/2016   Procedure: Left Heart Cath and Coronary Angiography;  Surgeon: Leonie Man, MD;  Location: [place] [facility];  Service: Cardiovascular: mRCA 85% (PCI), branch of OM2 80%,  ostD1 90%; EF 35-45%   CARDIAC CATHETERIZATION N/A 02/17/2016   Procedure: Coronary Stent Intervention;  Surgeon: Leonie Man, MD;  Location: [place] [facility];  Service: Cardiovascular: mRCA PCI : Ronna Polio PREM MR 2.4P80 DES   CARDIOVERSION N/A 03/29/2018   Procedure: CARDIOVERSION;  Surgeon: Larey Dresser, MD;  Location: [facility] ENDOSCOPY;  Service: Cardiovascular;  Laterality: N/A;   CARDIOVERSION N/A 04/02/2018   Procedure: CARDIOVERSION;  Surgeon: Larey Dresser, MD;  Location: [facility] ENDOSCOPY;  Service: Cardiovascular;  Laterality: N/A;   CARDIOVERSION N/A 04/19/2018   Procedure: CARDIOVERSION;  Surgeon: Larey Dresser, MD;  Location: [facility] ENDOSCOPY;  Service: Cardiovascular;  Laterality: N/A;   CARDIOVERSION N/A 08/11/2020   Procedure: CARDIOVERSION;  Surgeon: Larey Dresser, MD;  Location: [facility] ENDOSCOPY;  Service: Cardiovascular;  Laterality: N/A;   CATARACT EXTRACTION  2010   b/l   CORONARY STENT INTERVENTION N/A 04/03/2018   Procedure: CORONARY STENT INTERVENTION;  Surgeon: Troy Sine, MD;  Location: [place] [facility];  Service: Cardiovascular;  Laterality: N/A;   CORONARY STENT INTERVENTION N/A 03/02/2020   Procedure: CORONARY STENT INTERVENTION;  Surgeon: Jettie Booze, MD;  Location: [place] [facility];  Service: Cardiovascular;  Laterality: N/A;   INTRAVASCULAR  ULTRASOUND/IVUS N/A 03/02/2020   Procedure: Intravascular Ultrasound/IVUS;  Surgeon: Jettie Booze, MD;  Location: [place] [facility];  Service: Cardiovascular;  Laterality: N/A;   RETINAL LASER PROCEDURE  1985   RIGHT/LEFT HEART CATH AND CORONARY ANGIOGRAPHY N/A 04/03/2018   Procedure: RIGHT/LEFT HEART CATH AND CORONARY ANGIOGRAPHY;  Surgeon: Larey Dresser, MD;  Location: [place] [facility];  Service: Cardiovascular;  Laterality: N/A;   RIGHT/LEFT HEART CATH AND CORONARY ANGIOGRAPHY N/A 03/02/2020   Procedure: RIGHT/LEFT HEART CATH AND CORONARY ANGIOGRAPHY;  Surgeon: Larey Dresser, MD;  Location: [place] [facility];  Service: Cardiovascular;  Laterality: N/A;   TEE WITHOUT CARDIOVERSION N/A 03/29/2018   Procedure: TRANSESOPHAGEAL ECHOCARDIOGRAM (TEE);  Surgeon: Larey Dresser, MD;  Location: [facility] ENDOSCOPY;  Service: Cardiovascular;  Laterality: N/A;   TEE WITHOUT CARDIOVERSION N/A 04/19/2018   Procedure: TRANSESOPHAGEAL ECHOCARDIOGRAM (TEE);  Surgeon: Larey Dresser, MD;  Location: [facility] ENDOSCOPY;  Service: Cardiovascular;  Laterality: N/A;   TEE WITHOUT CARDIOVERSION N/A 03/02/2020   Procedure: TRANSESOPHAGEAL ECHOCARDIOGRAM (TEE);  Surgeon: Larey Dresser, MD;  Location: [facility] ENDOSCOPY;  Service: Cardiovascular;  Laterality: N/A;   TEE WITHOUT CARDIOVERSION N/A 10/26/2020   Procedure: TRANSESOPHAGEAL ECHOCARDIOGRAM (TEE);  Surgeon: Acie Fredrickson Wonda Cheng, MD;  Location: [place];  Service: Cardiovascular;  Laterality: N/A;   [place] ECHOCARDIOGRAM  02/2016   Mild LVH. Moderately reduced EF of 35-40% with  diffuse hypokinesis. Indeterminate diastolic function. Likely elevated LA pressures. Mild aortic stenosis (mean gradient 10 mmHg). Mild MR. Mild LA dilation. Mild to moderate PA hypertension with 39 mmHg   VIDEO ASSISTED THORACOSCOPY (VATS)/THOROCOTOMY Right 01/24/2015   Procedure: VIDEO ASSISTED THORACOSCOPY (VATS) FOR DRAINAGE OF RIGHT  HEMOTHORAX;  Surgeon: Gaye Pollack, MD;  Location: [place] OR;  Service: Thoracic;  Laterality: Right;   Social History:  reports that he has never smoked. He has never used smokeless tobacco. He reports current alcohol use. He reports that he does not use drugs.  Allergies  Allergen Reactions   Amiodarone Shortness Of Breath   Family History  Problem Relation Age of Onset   CAD Neg Hx     Prior to Admission medications   Medication Sig Start Date End Date Taking? Authorizing Provider  acetaminophen (TYLENOL) 500 MG tablet Take 1,000 mg by mouth every 6 (six) hours as needed for moderate pain or headache.   Yes [provider]  ADVAIR DISKUS 100-50 MCG/ACT AEPB INHALE 1 PUFF INTO THE LUNGS TWICE DAILY 12/13/20  Yes Saguier, Percell Miller, PA-C  albuterol (VENTOLIN HFA) 108 (90 Base) MCG/ACT inhaler Inhale 2 puffs into the lungs every 6 (six) hours as needed for wheezing or shortness of breath. 12/20/20  Yes [name], MD  carvedilol (COREG) 25 MG tablet Take 12.5 mg by mouth 2 (two) times daily with a meal.   Yes [provider]  ELIQUIS 5 MG TABS tablet TAKE 1 TABLET(5 MG) BY MOUTH TWICE DAILY 05/25/20  Yes Larey Dresser, MD  empagliflozin (JARDIANCE) 25 MG TABS tablet Take 1 tablet (25 mg total) by mouth daily before breakfast. 12/02/20  Yes Shamleffer, Melanie Crazier, MD  ENTRESTO 49-51 MG Take 1 tablet by mouth 2 (two) times daily. 12/10/20  Yes [provider]  ezetimibe (ZETIA) 10 MG tablet Take 1 tablet (10 mg total) by mouth daily. 06/17/20  Yes Larey Dresser, MD  icosapent Ethyl (VASCEPA) 1 g capsule Take 2 capsules (2 g total) by mouth 2 (two) times daily. 06/04/20  Yes Larey Dresser, MD  insulin glargine (LANTUS SOLOSTAR) 100 UNIT/ML Solostar Pen Inject 10 Units into the skin at bedtime. 09/13/20  Yes [name], MD  insulin lispro (HUMALOG KWIKPEN) 100 UNIT/ML KwikPen Max daily 15 units 11/30/20  Yes Shamleffer, Melanie Crazier, MD  metFORMIN  (GLUCOPHAGE) 1000 MG tablet Take 1 tablet (1,000 mg total) by mouth daily with breakfast. 03/04/20  Yes Larey Dresser, MD  predniSONE (DELTASONE) 5 MG tablet TAKE 1 TABLET BY MOUTH EVERY DAY X 10 DAYS THEN 1/2 TABLET X 10 DAYS THEN 1/2 TABLET EVERY OTHER DAY THEN STOP. START AFTER 10 MG TAPER 12/23/20  Yes [name], MD  ranolazine (RANEXA) 500 MG 12 hr tablet Take 1 tablet (500 mg total) by mouth 2 (two) times daily. 07/08/20  Yes Larey Dresser, MD  rosuvastatin (CRESTOR) 40 MG tablet Take 1 tablet (40 mg total) by mouth daily. 11/10/20  Yes [name], MD  sacubitril-valsartan (ENTRESTO) 24-26 MG Take 1 tablet by mouth 2 (two) times daily. 09/28/20  Yes Larey Dresser, MD  cholecalciferol (VITAMIN D) 1000 units tablet Take 1 tablet (1,000 Units total) by mouth daily. 12/09/19   Amin, Jeanella Flattery, MD  glucose blood test strip Use as instructed 09/03/20   [name], MD  Insulin Pen Needle (PEN NEEDLES) 32G X 4 MM MISC Inject 1 Device into the skin in the morning, at noon,  in the evening, and at bedtime. Use 1 at bedtime with Lantus.  Dx code: E11.9 11/30/20   Shamleffer, Melanie Crazier, MD  nitroGLYCERIN (NITROSTAT) 0.4 MG SL tablet Place 1 tablet (0.4 mg total) under the tongue every 5 (five) minutes as needed for chest pain. 12/09/19   Amin, Jeanella Flattery, MD  pantoprazole (PROTONIX) 40 MG tablet Take 1 tablet (40 mg total) by mouth daily. 10/26/20 12/10/20  Allred, Jeneen Rinks, MD  polyvinyl alcohol (LIQUIFILM TEARS) 1.4 % ophthalmic solution Place 1 drop into both eyes daily as needed for dry eyes (irritation).    [provider]  predniSONE (DELTASONE) 10 MG tablet Take 3 tablets (30 mg total) by mouth daily with breakfast for 21 days, THEN 2 tablets (20 mg total) daily with breakfast for 21 days, THEN 1 tablet (10 mg total) daily with breakfast for 21 days, THEN 0.5 tablets (5 mg total) daily with breakfast for 21 days. 10/12/20 01/04/21  Brand Males, MD  sodium polystyrene  (KAYEXALATE) 15 GM/60ML suspension Take 60 mLs (15 g total) by mouth daily as needed. 09/08/20   [name], MD  torsemide (DEMADEX) 20 MG tablet Take 1 tablet (20 mg total) by mouth in the morning. Start taking on 08/22/20 08/20/20 11/23/20  Arrien, Jimmy Picket, MD   Physical Exam:  Constitutional: Elderly male who appears ill but in no acute distress Vitals:   12/28/20 0340 12/28/20 0553 12/28/20 0723 12/28/20 0855  BP: 119/77   112/67  Pulse: 100   98  Resp: (!) 21   (!) 23  Temp: 98 F (36.7 C)   97.9 F (36.6 C)  TempSrc: Oral   Oral  SpO2: 92%  92% 92%  Weight:  112 kg    Height:       Eyes: PERRL, lids and conjunctivae normal ENMT: Mucous membranes are moist. Posterior pharynx clear of any exudate or lesions.  Neck: normal, supple Respiratory: Positive crackles appreciated in the lower lung fields.  No significant wheezes or rhonchi.  Patient currently on 10 L of high flow nasal cannula oxygen. Cardiovascular: Irregular irregular. No extremity edema. 2+ pedal pulses. No carotid bruits.  Abdomen: no tenderness, no masses palpated. No hepatosplenomegaly. Bowel sounds positive.  Musculoskeletal:  . No joint deformity upper and lower extremities. Good ROM, no contractures. Normal muscle tone.  Skin: no rashes, lesions, ulcers. No induration Neurologic: CN 2-12 grossly intact. Sensation intact, DTR normal. Strength 5/5 in all 4.  Psychiatric: Normal judgment and insight. Alert and oriented x 3. Normal mood.   Labs on Admission:  Basic Metabolic Panel: Recent Labs  Lab 12/27/20 1336 12/27/20 1409 12/28/20 0232  NA 135  --  139  K 4.0  --  3.5  CL 102  --  103  CO2 22  --  25  GLUCOSE 263*  --  179*  BUN 22  --  19  CREATININE 1.57*  --  1.44*  CALCIUM 8.5*  --  8.5*  MG  --  2.3  --    Liver Function Tests: No results for input(s): AST, ALT, ALKPHOS, BILITOT, PROT, ALBUMIN in the last 168 hours. No results for input(s): LIPASE, AMYLASE in the last 168 hours. No  results for input(s): AMMONIA in the last 168 hours. CBC: Recent Labs  Lab 12/27/20 1336  WBC 11.5*  HGB 12.2*  HCT 38.8*  MCV 93.9  PLT 506*   Cardiac Enzymes: No results for input(s): CKTOTAL, CKMB, CKMBINDEX, TROPONINI in the last 168 hours. BNP: Invalid  input(s): POCBNP CBG: Recent Labs  Lab 12/27/20 1840 12/27/20 2150 12/28/20 0557  GLUCAP 182* 150* 220*    Radiological Exams on Admission: DG Chest Portable 1 View  Result Date: 12/27/2020 CLINICAL DATA:  Dyspnea with exertion. EXAM: PORTABLE CHEST 1 VIEW COMPARISON:  November 24, 2020. FINDINGS: Stable cardiomegaly. No pneumothorax is noted. Increased bilateral perihilar and basilar opacities are noted concerning for pneumonia or possibly edema. Bony thorax is unremarkable. IMPRESSION: Increased bilateral lung opacities as described above. Electronically Signed   By: Marijo Conception M.D.   On: 12/27/2020 14:00   [place] EKG SITE RITE  Result Date: 12/28/2020 If Site Rite image not attached, placement could not be confirmed due to current cardiac rhythm.   EKG: Independently reviewed.  Atrial fibrillation with RVR at 136 bpm  Time spent: >45 minutes  [facility]   If 7PM-7AM, please contact night-coverage

## 2020-12-28 NOTE — Consult Note (Addendum)
Cardiology Consultation:   Patient ID: David Huynh MRN: [id number]; DOB: 1940-12-14  Admit date: 12/27/2020 Date of Consult: 12/28/2020  PCP:  Bradd Canary, MD   [facility] Providers Cardiologist:  [name], MD   {     Patient Profile:   David Huynh is a 80 y.o. male with a hx of  CAD, chronic systolic CHF, HTN, HLD, DM, CKD 3, and asthma, amiodarone (pulm) toxicity, who is being seen 12/28/2020 for the evaluation of AFib at the request of Dr. Gala Romney.  History of Present Illness:   David Huynh underwent AFib ablation in June with Dr. Johney Frame, he had his AF clinic follow up as usual and   He messaged the office yesterday that he had been back in afib a few weeks and was progressively feeling more poorly with plans to go to the ER.  There he was found somewhat hypoxic with O2sats 80s, known baseline lung disease, felt to be volume OL and ER MD felt admission was indicated.  He was also then subsequently found to be COVID +.  He was admitted via HF team, felt only mildly volume OL and started on IV lasix and dilt gtt and dig for rate control with initial plans to DCCV today  EP is asked to weigh in on rhythm control/AAD recommendations  LABS K+ 3.5 BUN/Creat 19/1.44 WBC 11.5 H/H 12/38 Plts 506  HS Trop 12, 13  I have spoken to the patient post DCCV he feels much better in SR.  He has also diuresed, but says SR is the big difference and can tell when he is in/out of rhythm. No active SOB No CP    Past Medical History:  Diagnosis Date   Allergy    seasonal   Asthma    mild, intermittent   Atrial fibrillation with rapid ventricular response (HCC) 03/25/2018   CAD, multiple vessel    Three-vessel disease involving mid RCA 85% (PCI with DES), OM 2 80% in branch and ostD1 ~90%. Only the mid RCA with PCI target.    Carotid bruit 08/05/2010   Bruit noted on left    Chronic combined systolic and diastolic CHF (congestive heart failure) (HCC)    CKD  (chronic kidney disease), stage III (HCC)    Diabetes mellitus    type 2   Fracture of multiple ribs 02/12/2015   Hemothorax on right 01/23/2015   Hyperlipidemia    Hypertension    Hypocalcemia 02/12/2015   Lactic acidosis 03/25/2018   Liver function study, abnormal 04/26/2011   Multiple rib fractures    Obesity    Persistent atrial fibrillation (HCC)    Pneumonia     Past Surgical History:  Procedure Laterality Date   APPENDECTOMY  1975   ATRIAL FIBRILLATION ABLATION N/A 10/26/2020   Procedure: ATRIAL FIBRILLATION ABLATION;  Surgeon: Hillis Range, MD;  Location: [facility] INVASIVE CV LAB;  Service: Cardiovascular;  Laterality: N/A;   CARDIAC CATHETERIZATION N/A 02/17/2016   Procedure: Left Heart Cath and Coronary Angiography;  Surgeon: Marykay Lex, MD;  Location: [facility] INVASIVE CV LAB;  Service: Cardiovascular: mRCA 85% (PCI), branch of OM2 80%,  ostD1 90%; EF 35-45%   CARDIAC CATHETERIZATION N/A 02/17/2016   Procedure: Coronary Stent Intervention;  Surgeon: Marykay Lex, MD;  Location: [facility] INVASIVE CV LAB;  Service: Cardiovascular: mRCA PCI : Andre Lefort PREM MR 2.2L79 DES   CARDIOVERSION N/A 03/29/2018   Procedure: CARDIOVERSION;  Surgeon: Laurey Morale, MD;  Location: [facility] ENDOSCOPY;  Service:  Cardiovascular;  Laterality: N/A;   CARDIOVERSION N/A 04/02/2018   Procedure: CARDIOVERSION;  Surgeon: Laurey Morale, MD;  Location: [facility] ENDOSCOPY;  Service: Cardiovascular;  Laterality: N/A;   CARDIOVERSION N/A 04/19/2018   Procedure: CARDIOVERSION;  Surgeon: Laurey Morale, MD;  Location: [facility] ENDOSCOPY;  Service: Cardiovascular;  Laterality: N/A;   CARDIOVERSION N/A 08/11/2020   Procedure: CARDIOVERSION;  Surgeon: Laurey Morale, MD;  Location: [facility] ENDOSCOPY;  Service: Cardiovascular;  Laterality: N/A;   CATARACT EXTRACTION  2010   b/l   CORONARY STENT INTERVENTION N/A 04/03/2018   Procedure: CORONARY STENT INTERVENTION;  Surgeon: Lennette Bihari, MD;  Location: [facility] INVASIVE CV LAB;   Service: Cardiovascular;  Laterality: N/A;   CORONARY STENT INTERVENTION N/A 03/02/2020   Procedure: CORONARY STENT INTERVENTION;  Surgeon: Corky Crafts, MD;  Location: [facility] INVASIVE CV LAB;  Service: Cardiovascular;  Laterality: N/A;   INTRAVASCULAR ULTRASOUND/IVUS N/A 03/02/2020   Procedure: Intravascular Ultrasound/IVUS;  Surgeon: Corky Crafts, MD;  Location: [facility] INVASIVE CV LAB;  Service: Cardiovascular;  Laterality: N/A;   RETINAL LASER PROCEDURE  1985   RIGHT/LEFT HEART CATH AND CORONARY ANGIOGRAPHY N/A 04/03/2018   Procedure: RIGHT/LEFT HEART CATH AND CORONARY ANGIOGRAPHY;  Surgeon: Laurey Morale, MD;  Location: [facility] INVASIVE CV LAB;  Service: Cardiovascular;  Laterality: N/A;   RIGHT/LEFT HEART CATH AND CORONARY ANGIOGRAPHY N/A 03/02/2020   Procedure: RIGHT/LEFT HEART CATH AND CORONARY ANGIOGRAPHY;  Surgeon: Laurey Morale, MD;  Location: [facility] INVASIVE CV LAB;  Service: Cardiovascular;  Laterality: N/A;   TEE WITHOUT CARDIOVERSION N/A 03/29/2018   Procedure: TRANSESOPHAGEAL ECHOCARDIOGRAM (TEE);  Surgeon: Laurey Morale, MD;  Location: [facility] ENDOSCOPY;  Service: Cardiovascular;  Laterality: N/A;   TEE WITHOUT CARDIOVERSION N/A 04/19/2018   Procedure: TRANSESOPHAGEAL ECHOCARDIOGRAM (TEE);  Surgeon: Laurey Morale, MD;  Location: [facility] ENDOSCOPY;  Service: Cardiovascular;  Laterality: N/A;   TEE WITHOUT CARDIOVERSION N/A 03/02/2020   Procedure: TRANSESOPHAGEAL ECHOCARDIOGRAM (TEE);  Surgeon: Laurey Morale, MD;  Location: [facility] ENDOSCOPY;  Service: Cardiovascular;  Laterality: N/A;   TEE WITHOUT CARDIOVERSION N/A 10/26/2020   Procedure: TRANSESOPHAGEAL ECHOCARDIOGRAM (TEE);  Surgeon: Elease Hashimoto Deloris Ping, MD;  Location: [facility] ENDOSCOPY;  Service: Cardiovascular;  Laterality: N/A;   TONSILLECTOMY AND ADENOIDECTOMY  1947   TRANSTHORACIC ECHOCARDIOGRAM  02/2016   Mild LVH. Moderately reduced EF of 35-40% with diffuse hypokinesis. Indeterminate diastolic function. Likely elevated LA  pressures. Mild aortic stenosis (mean gradient 10 mmHg). Mild MR. Mild LA dilation. Mild to moderate PA hypertension with 39 mmHg   VIDEO ASSISTED THORACOSCOPY (VATS)/THOROCOTOMY Right 01/24/2015   Procedure: VIDEO ASSISTED THORACOSCOPY (VATS) FOR DRAINAGE OF RIGHT HEMOTHORAX;  Surgeon: Alleen Borne, MD;  Location: [facility] OR;  Service: Thoracic;  Laterality: Right;     Home Medications:  Prior to Admission medications   Medication Sig Start Date End Date Taking? Authorizing Provider  acetaminophen (TYLENOL) 500 MG tablet Take 1,000 mg by mouth every 6 (six) hours as needed for moderate pain or headache.   Yes [provider]  ADVAIR DISKUS 100-50 MCG/ACT AEPB INHALE 1 PUFF INTO THE LUNGS TWICE DAILY 12/13/20  Yes Saguier, Ramon Dredge, PA-C  albuterol (VENTOLIN HFA) 108 (90 Base) MCG/ACT inhaler Inhale 2 puffs into the lungs every 6 (six) hours as needed for wheezing or shortness of breath. 12/20/20  Yes Bradd Canary, MD  carvedilol (COREG) 25 MG tablet Take 12.5 mg by mouth 2 (two) times daily with a meal.   Yes [provider]  ELIQUIS 5 MG TABS tablet  TAKE 1 TABLET(5 MG) BY MOUTH TWICE DAILY 05/25/20  Yes Laurey Morale, MD  empagliflozin (JARDIANCE) 25 MG TABS tablet Take 1 tablet (25 mg total) by mouth daily before breakfast. 12/02/20  Yes Shamleffer, Konrad Dolores, MD  ENTRESTO 49-51 MG Take 1 tablet by mouth 2 (two) times daily. 12/10/20  Yes [provider]  ezetimibe (ZETIA) 10 MG tablet Take 1 tablet (10 mg total) by mouth daily. 06/17/20  Yes Laurey Morale, MD  icosapent Ethyl (VASCEPA) 1 g capsule Take 2 capsules (2 g total) by mouth 2 (two) times daily. 06/04/20  Yes Laurey Morale, MD  insulin glargine (LANTUS SOLOSTAR) 100 UNIT/ML Solostar Pen Inject 10 Units into the skin at bedtime. 09/13/20  Yes Bradd Canary, MD  insulin lispro (HUMALOG KWIKPEN) 100 UNIT/ML KwikPen Max daily 15 units 11/30/20  Yes Shamleffer, Konrad Dolores, MD  metFORMIN (GLUCOPHAGE) 1000 MG  tablet Take 1 tablet (1,000 mg total) by mouth daily with breakfast. 03/04/20  Yes Laurey Morale, MD  predniSONE (DELTASONE) 5 MG tablet TAKE 1 TABLET BY MOUTH EVERY DAY X 10 DAYS THEN 1/2 TABLET X 10 DAYS THEN 1/2 TABLET EVERY OTHER DAY THEN STOP. START AFTER 10 MG TAPER 12/23/20  Yes Bradd Canary, MD  ranolazine (RANEXA) 500 MG 12 hr tablet Take 1 tablet (500 mg total) by mouth 2 (two) times daily. 07/08/20  Yes Laurey Morale, MD  rosuvastatin (CRESTOR) 40 MG tablet Take 1 tablet (40 mg total) by mouth daily. 11/10/20  Yes Bradd Canary, MD  sacubitril-valsartan (ENTRESTO) 24-26 MG Take 1 tablet by mouth 2 (two) times daily. 09/28/20  Yes Laurey Morale, MD  cholecalciferol (VITAMIN D) 1000 units tablet Take 1 tablet (1,000 Units total) by mouth daily. 12/09/19   Amin, Loura Halt, MD  glucose blood test strip Use as instructed 09/03/20   Bradd Canary, MD  Insulin Pen Needle (PEN NEEDLES) 32G X 4 MM MISC Inject 1 Device into the skin in the morning, at noon, in the evening, and at bedtime. Use 1 at bedtime with Lantus.  Dx code: E11.9 11/30/20   Shamleffer, Konrad Dolores, MD  nitroGLYCERIN (NITROSTAT) 0.4 MG SL tablet Place 1 tablet (0.4 mg total) under the tongue every 5 (five) minutes as needed for chest pain. 12/09/19   Amin, Loura Halt, MD  pantoprazole (PROTONIX) 40 MG tablet Take 1 tablet (40 mg total) by mouth daily. 10/26/20 12/10/20  Omolola Mittman, Fayrene Fearing, MD  polyvinyl alcohol (LIQUIFILM TEARS) 1.4 % ophthalmic solution Place 1 drop into both eyes daily as needed for dry eyes (irritation).    [provider]  predniSONE (DELTASONE) 10 MG tablet Take 3 tablets (30 mg total) by mouth daily with breakfast for 21 days, THEN 2 tablets (20 mg total) daily with breakfast for 21 days, THEN 1 tablet (10 mg total) daily with breakfast for 21 days, THEN 0.5 tablets (5 mg total) daily with breakfast for 21 days. 10/12/20 01/04/21  Kalman Shan, MD  sodium polystyrene (KAYEXALATE) 15  GM/60ML suspension Take 60 mLs (15 g total) by mouth daily as needed. 09/08/20   Bradd Canary, MD  torsemide (DEMADEX) 20 MG tablet Take 1 tablet (20 mg total) by mouth in the morning. Start taking on 08/22/20 08/20/20 11/23/20  Arrien, York Ram, MD    Inpatient Medications: Scheduled Meds:  [MAR Hold] apixaban  5 mg Oral BID   [MAR Hold] vitamin C  500 mg Oral Daily   [MAR Hold] baricitinib  2 mg Oral Daily   [MAR Hold] carvedilol  12.5 mg Oral BID WC   [MAR Hold] Chlorhexidine Gluconate Cloth  6 each Topical Daily   [MAR Hold] dexamethasone (DECADRON) injection  6 mg Intravenous Q24H   [MAR Hold] digoxin  0.125 mg Oral Daily   [MAR Hold] empagliflozin  25 mg Oral QAC breakfast   [MAR Hold] ezetimibe  10 mg Oral Daily   [MAR Hold] furosemide  80 mg Intravenous BID   [MAR Hold] icosapent Ethyl  2 g Oral BID   [MAR Hold] insulin aspart  0-20 Units Subcutaneous TID WC   [MAR Hold] Ipratropium-Albuterol  1 puff Inhalation Q6H   [MAR Hold] pantoprazole  40 mg Oral Daily   [MAR Hold] potassium chloride  40 mEq Oral Q6H   [MAR Hold] ranolazine  500 mg Oral BID   [MAR Hold] rosuvastatin  40 mg Oral Daily   [MAR Hold] sacubitril-valsartan  1 tablet Oral BID   [MAR Hold] sodium chloride flush  10-40 mL Intracatheter Q12H   [MAR Hold] sodium chloride flush  3 mL Intravenous Q12H   [MAR Hold] zinc sulfate  220 mg Oral Daily   Continuous Infusions:  [MAR Hold] sodium chloride     [MAR Hold] remdesivir 200 mg in sodium chloride 0.9% 250 mL IVPB     Followed by   [facility] Hold] remdesivir 100 mg in NS 100 mL     PRN Meds: [MAR Hold] sodium chloride, [MAR Hold] acetaminophen, [MAR Hold] chlorpheniramine-HYDROcodone, [MAR Hold] guaiFENesin-dextromethorphan, [MAR Hold] nitroGLYCERIN, [MAR Hold] sodium chloride flush, [MAR Hold] sodium chloride flush  Allergies:    Allergies  Allergen Reactions   Amiodarone Shortness Of Breath    Social History:   Social History   Socioeconomic History    Marital status: Married    Spouse name: Not on file   Number of children: Not on file   Years of education: Not on file   Highest education level: Not on file  Occupational History   Not on file  Tobacco Use   Smoking status: Never   Smokeless tobacco: Never  Vaping Use   Vaping Use: Never used  Substance and Sexual Activity   Alcohol use: Yes    Comment: rare use now   Drug use: No   Sexual activity: Not on file  Other Topics Concern   Not on file  Social History Narrative   Not on file   Social Determinants of Health   Financial Resource Strain: Not on file  Food Insecurity: Not on file  Transportation Needs: Not on file  Physical Activity: Not on file  Stress: Not on file  Social Connections: Not on file  Intimate Partner Violence: Not on file    Family History:   Family History  Problem Relation Age of Onset   CAD Neg Hx      ROS:  Please see the history of present illness.  All other ROS reviewed and negative.     Physical Exam/Data:   Vitals:   12/28/20 0553 12/28/20 0723 12/28/20 0855 12/28/20 1147  BP:   112/67 98/71  Pulse:   98 93  Resp:   (!) 23 20  Temp:   97.9 F (36.6 C) 98.1 F (36.7 C)  TempSrc:   Oral Oral  SpO2:  92% 92% 93%  Weight: 112 kg     Height:        Intake/Output Summary (Last 24 hours) at 12/28/2020 1219 Last data filed at 12/28/2020 1152 Gross  per 24 hour  Intake 136.32 ml  Output 2800 ml  Net -2663.68 ml   Last 3 Weights 12/28/2020 12/27/2020 12/27/2020  Weight (lbs) 246 lb 14.6 oz 253 lb 12 oz 270 lb 1 oz  Weight (kg) 112 kg 115.1 kg 122.5 kg     Body mass index is 31.7 kg/m.   Telephone conversation: The patient is AAO, he is very pleasant, speaking in full sentences and does not sound SOB or in any disctress Speech pattern in normal in pace and tone  EKG:  The EKG was personally reviewed and demonstrates:   AFib 136bpm Telemetry:  Telemetry was personally reviewed and demonstrates:   AFib 100-110's >>  SR  Relevant CV Studies:  10/26/20 EPS/ablation CONCLUSIONS: 1. Atrial fibrillation upon presentation.   2. Intracardiac echo reveals a moderate to large sized left atrium. 3. Successful electrical isolation and anatomical encircling of all four pulmonary veins with radiofrequency current. 4. Additional mapping and ablation within the left atrium due to persistence of atrial fibrillation with a posterior wall box demonstrated  5. Atrial fibrillation successfully cardioverted to sinus rhythm. 6. Cavo-tricuspid isthmus ablation performed with complete bidirectional isthmus block achieved 7. No inducible arrhythmias following ablation 8. No early apparent complications.   10/26/20: TEE IMPRESSIONS   1. Left ventricular ejection fraction, by estimation, is 40 to 45%. The  left ventricle has mildly decreased function.   2. Right ventricular systolic function is normal. The right ventricular  size is normal.   3. Left atrial size was moderately dilated. No left atrial/left atrial  appendage thrombus was detected.   4. Right atrial size was moderately dilated.   5. There is no reversal of flow in the pulmonary vein. . The mitral valve  is grossly normal. Mild to moderate mitral valve regurgitation.   6. The aortic valve is calcified. There is mild thickening of the aortic  valve. Aortic valve regurgitation is mild. Mild aortic valve stenosis.   Laboratory Data:  High Sensitivity Troponin:   Recent Labs  Lab 12/27/20 1336 12/27/20 1941  TROPONINIHS 12 13     Chemistry Recent Labs  Lab 12/27/20 1336 12/28/20 0232  NA 135 139  K 4.0 3.5  CL 102 103  CO2 22 25  GLUCOSE 263* 179*  BUN 22 19  CREATININE 1.57* 1.44*  CALCIUM 8.5* 8.5*  GFRNONAA 45* 49*  ANIONGAP 11 11    No results for input(s): PROT, ALBUMIN, AST, ALT, ALKPHOS, BILITOT in the last 168 hours. Hematology Recent Labs  Lab 12/27/20 1336  WBC 11.5*  RBC 4.13*  HGB 12.2*  HCT 38.8*  MCV 93.9  MCH 29.5   MCHC 31.4  RDW 14.1  PLT 506*   BNP Recent Labs  Lab 12/27/20 1409  BNP 502.6*    DDimer No results for input(s): DDIMER in the last 168 hours.   Radiology/Studies:  DG CHEST PORT 1 VIEW  Result Date: 12/28/2020 CLINICAL DATA:  PICC line placement. EXAM: PORTABLE CHEST 1 VIEW COMPARISON:  Chest x-ray from yesterday. FINDINGS: New right upper extremity PICC line with tip in the distal SVC. Stable cardiomegaly. Unchanged bilateral airspace disease. No pneumothorax. No acute osseous abnormality. IMPRESSION: 1. New right upper extremity PICC line with tip in the distal SVC. 2. Unchanged extensive bilateral airspace disease. Electronically Signed   By: Obie DredgeWilliam T Derry M.D.   On: 12/28/2020 11:29   DG Chest Portable 1 View  Result Date: 12/27/2020 CLINICAL DATA:  Dyspnea with exertion. EXAM: PORTABLE CHEST 1 VIEW COMPARISON:  November 24, 2020. FINDINGS: Stable cardiomegaly. No pneumothorax is noted. Increased bilateral perihilar and basilar opacities are noted concerning for pneumonia or possibly edema. Bony thorax is unremarkable. IMPRESSION: Increased bilateral lung opacities as described above. Electronically Signed   By: Lupita Raider M.D.   On: 12/27/2020 14:00   [place] EKG SITE RITE  Result Date: 12/28/2020 If Site Rite image not attached, placement could not be confirmed due to current cardiac rhythm.    Assessment and Plan:   Persistent AFib CHA2DS2Vasc is 6, on Eliquis S/p PVI ablation on 10/26/20 Hx of amiodarone toxoicity  Pt reported initially in/out of AFib though messaged yesterday having a couple weeks and feeling poorly He was admitted with some volume OL and also noted COVID positive  He is now s/p DCCV He remains within his 3 mo post ablation window and would try and avoid AAD  Should he had EFRAF again, would re-consider  Dr. Johney Frame will add his final thoughts and recommendations later today   Risk Assessment/Risk Scores:     For questions or updates, please  contact [facility] Please consult [email or web address] for contact info under    Signed, Sheilah Pigeon, PA-C  12/28/2020 12:19 PM    I have reviewed the above assessment and plan.  Changes to above are made where necessary.  The patient is quite ill with COVID.  + ERAF post ablation.   I would advise DCC once clinically stable.  EP to follow.  Co Sign: Hillis Range, MD

## 2020-12-28 NOTE — Anesthesia Procedure Notes (Signed)
Procedure Name: General with mask airway Date/Time: 12/28/2020 12:38 PM Performed by: Lelon Perla, CRNA Pre-anesthesia Checklist: Patient identified, Emergency Drugs available, Timeout performed, Patient being monitored and Suction available Patient Re-evaluated:Patient Re-evaluated prior to induction Oxygen Delivery Method: Simple face mask Preoxygenation: Pre-oxygenation with 100% oxygen Induction Type: IV induction Placement Confirmation: positive ETCO2 and CO2 detector Dental Injury: Teeth and Oropharynx as per pre-operative assessment

## 2020-12-28 NOTE — Progress Notes (Signed)
Peripherally Inserted Central Catheter Placement  The IV Nurse has discussed with the patient and/or persons authorized to consent for the patient, the purpose of this procedure and the potential benefits and risks involved with this procedure.  The benefits include less needle sticks, lab draws from the catheter, and the patient may be discharged home with the catheter. Risks include, but not limited to, infection, bleeding, blood clot (thrombus formation), and puncture of an artery; nerve damage and irregular heartbeat and possibility to perform a PICC exchange if needed/ordered by physician.  Alternatives to this procedure were also discussed.  Bard Power PICC patient education guide, fact sheet on infection prevention and patient information card has been provided to patient /or left at bedside.    PICC Placement Documentation  PICC Double Lumen 12/28/20 PICC Right Basilic 41 cm 0 cm (Active)  Indication for Insertion or Continuance of Line Vasoactive infusions 12/28/20 1103  Exposed Catheter (cm) 0 cm 12/28/20 1103  Site Assessment Clean;Dry;Intact 12/28/20 1103  Lumen #1 Status Flushed;Saline locked;Blood return noted 12/28/20 1103  Lumen #2 Status Flushed;Saline locked;Blood return noted 12/28/20 1103  Dressing Type Transparent;Securing device 12/28/20 1103  Dressing Status Clean;Dry;Intact 12/28/20 1103  Antimicrobial disc in place? Yes 12/28/20 1103  Dressing Intervention New dressing;Other (Comment) 12/28/20 1103  Dressing Change Due 01/04/21 12/28/20 1103       Annett Fabian 12/28/2020, 11:04 AM

## 2020-12-28 NOTE — H&P (View-Only) (Signed)
Advanced Heart Failure Rounding Note  PCP-Cardiologist: [name], MD   Patient Profile    80 y/o male w/ h/o CAD, difficult to control Afib w/ prior failed DCCVs and recent Afib ablation 6/22 w/ recurrence, h/o amiodarone toxicity, h/o systolic heart failure felt to be tachymediated w/ improved EF 25-30%>>50-55% and stage III CKD, admitted for a/c CHF in the setting of recurrent Afib w/ RVR.   In ED, found to be COVID +.  Bedside echo showed EF 40-45% with mild to moderate AS    Subjective:    Increase in O2 requirements overnight, now on HFNC at 12L/min. Admit CXR w/ increased bilateral perihilar and basilar opacities.   AF. WBC 11.5. Denies cough.   Responding well to IV Lasix. Wt down 7 lb.   SCr 1.57>>1.44 K 3.5   Remains in Afib, HR improved some on Cardizem gtt, currently in the low 100s-110s   Objective:   Weight Range: 112 kg Body mass index is 31.7 kg/m.   Vital Signs:   Temp:  [98 F (36.7 C)-98.4 F (36.9 C)] 98 F (36.7 C) (08/16 0340) Pulse Rate:  [43-120] 100 (08/16 0340) Resp:  [18-33] 21 (08/16 0340) BP: (94-132)/(54-97) 119/77 (08/16 0340) SpO2:  [88 %-93 %] 92 % (08/16 0723) Weight:  [112 kg-122.5 kg] 112 kg (08/16 0553) Last BM Date: 12/27/20  Weight change: Filed Weights   12/27/20 1331 12/27/20 2011 12/28/20 0553  Weight: 122.5 kg 115.1 kg 112 kg    Intake/Output:   Intake/Output Summary (Last 24 hours) at 12/28/2020 0747 Last data filed at 12/28/2020 0700 Gross per 24 hour  Intake 79.22 ml  Output 1675 ml  Net -1595.78 ml      Physical Exam    General:  Well appearing but on HFNC HEENT: Normal Neck: Supple. JVP not well visualized . Carotids 2+ bilat; no bruits. No lymphadenopathy or thyromegaly appreciated. Cor: PMI nondisplaced. Irregularly irregular rhythm, tachy rate. No rubs, gallops or murmurs. Lungs: Clear Abdomen: Soft, nontender, nondistended. No hepatosplenomegaly. No bruits or masses. Good bowel  sounds. Extremities: No cyanosis, clubbing, rash, edema Neuro: Alert & orientedx3, cranial nerves grossly intact. moves all 4 extremities w/o difficulty. Affect pleasant   Telemetry   Afib, low 100s-110s   EKG    No new EKG to review   Labs    CBC Recent Labs    12/27/20 1336  WBC 11.5*  HGB 12.2*  HCT 38.8*  MCV 93.9  PLT 506*   Basic Metabolic Panel Recent Labs    58/09/98 1336 12/27/20 1409 12/28/20 0232  NA 135  --  139  K 4.0  --  3.5  CL 102  --  103  CO2 22  --  25  GLUCOSE 263*  --  179*  BUN 22  --  19  CREATININE 1.57*  --  1.44*  CALCIUM 8.5*  --  8.5*  MG  --  2.3  --    Liver Function Tests No results for input(s): AST, ALT, ALKPHOS, BILITOT, PROT, ALBUMIN in the last 72 hours. No results for input(s): LIPASE, AMYLASE in the last 72 hours. Cardiac Enzymes No results for input(s): CKTOTAL, CKMB, CKMBINDEX, TROPONINI in the last 72 hours.  BNP: BNP (last 3 results) Recent Labs    08/31/20 1547 09/28/20 1225 12/27/20 1409  BNP 124.8* 360.9* 502.6*    ProBNP (last 3 results) No results for input(s): PROBNP in the last 8760 hours.   D-Dimer No results for input(s): DDIMER in  the last 72 hours. Hemoglobin A1C No results for input(s): HGBA1C in the last 72 hours. Fasting Lipid Panel No results for input(s): CHOL, HDL, LDLCALC, TRIG, CHOLHDL, LDLDIRECT in the last 72 hours. Thyroid Function Tests No results for input(s): TSH, T4TOTAL, T3FREE, THYROIDAB in the last 72 hours.  Invalid input(s): FREET3  Other results:   Imaging    DG Chest Portable 1 View  Result Date: 12/27/2020 CLINICAL DATA:  Dyspnea with exertion. EXAM: PORTABLE CHEST 1 VIEW COMPARISON:  November 24, 2020. FINDINGS: Stable cardiomegaly. No pneumothorax is noted. Increased bilateral perihilar and basilar opacities are noted concerning for pneumonia or possibly edema. Bony thorax is unremarkable. IMPRESSION: Increased bilateral lung opacities as described above.  Electronically Signed   By: Lupita Raider M.D.   On: 12/27/2020 14:00     Medications:     Scheduled Medications:  apixaban  5 mg Oral BID   carvedilol  12.5 mg Oral BID WC   digoxin  0.125 mg Oral Daily   empagliflozin  25 mg Oral QAC breakfast   ezetimibe  10 mg Oral Daily   furosemide  40 mg Intravenous Q12H   furosemide  80 mg Intravenous BID   icosapent Ethyl  2 g Oral BID   insulin aspart  0-20 Units Subcutaneous TID WC   pantoprazole  40 mg Oral Daily   predniSONE  5 mg Oral Q breakfast   ranolazine  500 mg Oral BID   rosuvastatin  40 mg Oral Daily   sacubitril-valsartan  1 tablet Oral BID   sodium chloride flush  3 mL Intravenous Q12H    Infusions:  sodium chloride     diltiazem (CARDIZEM) infusion 5 mg/hr (12/28/20 0700)    PRN Medications: sodium chloride, acetaminophen, nitroGLYCERIN, sodium chloride flush    Assessment/Plan   1. Recurrent Atrial Fibrillation w/ RVR: Had successful DC-CV on 03/29/18 but then back in atrial fibrillation. Ranolazine added, but failed DCCV again 04/02/18. DCCV 04/19/18 was successful.  AF with RVR at 3/22 admission, amiodarone restarted and he was cardioverted to NSR, but he developed amiodarone lung toxicity.  Amiodarone was stopped.  He had subsequently gone back into rate-controlled atrial fibrillation. Underwent Afib Ablation 6/22. Now w/ recurrent PAF w/ RVR.  - Plan DCCV today (denies missed doses of Eliquis)  - Continue Eliquis 5 mg bid - Continue Coreg 12.5 mg bid - Continue ranolazine 500 mg bid  - No amiodarone with history of lung toxicity.   - ? Tikosyn. Will d/w EP - C/w short term Cardizem gtt for rate control at 5 mg/hr  - C/w digoxin 0.125 mg daily  - Keep K > 4.0 and Mg >2.0  2. Acute on Chronic systolic=>diastolic CHF: Prior ischemic cardiomyopathy, improved after PCI in 2017.  Echo 03/2018 with EF back down to 35-40% and moderate-severe RV dysfunction. This was in the setting of atrial fibrillation with RVR.  EF 40% on TEE 04/2018.  Echo was in 3/20, showing EF up to 50%.  Suspect that he primarily had a tachycardia-mediated CMP at that point.  Echo in 7/21 with EF 25-30%, severe RV dysfunction.  TEE in 10/21, however, showed EF up to 50%.  He had PCI to LAD in 10/21. Echo in 3/22 showed stable EF 50-55% with normal RV.  TEE 6/22 at time of Afib ablation showed stable EF 50-55% w/ normal RV. Now w/ acute on chronic CHF in setting of recurrent afib w/ RVR. Mildly fluid overloaded. NYHA Class II-III. Bedside Echo shows mildly  reduced EF now 40-45% - C/w IV Lasix 80 mg bid - Place PICC line for CVP monitoring  - Plan DCCV today - Continue Entresto to 24/26 bid (dose recently reduced given improved EF and bump in SCr) - Continue Coreg 12.5 mg bid (did not tolerate 25 mg dose due to low BP and dizziness)  - Continue Jardiance 25 mg daily  - Continue digoxin 0.125 mg daily  - repeat 2D Echo  3. CKD: Stage 3.  SCr 1.57 on admit c/w baseline. Stable at 1.4 today  - follow BMP w/ diuresis  4. CAD: History of DES to RCA in 9/17.  Underwent LHC 04/03/18 with PCI to prox left circ. LHC in 10/21 with PCI to LAD.   - He is on Eliquis so no ASA.   - He is now off Plavix (finished 6 month therapy)  - Continue statin.  5. DM2: on Jardiance  6. Aortic stenosis: Mild on 3/22 echo.  7. Mitral regurgitation: Mild on 3/22 echo.  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 He is off amiodarone.  He improved with steroids.   - Continue steroid taper per pulmonary, he is followed with pulmonary.  - Bactrim for PJP prophylaxis  9. Acute on Chronic Hypoxic Respiratory Failure  - h/o amio toxicity, remains on steroid taper (prednisone 5 mg/day).  - hypoxia exacerbated by a/c CHF, COVID 19 infection + Afib w/ RVR - c/w diuresis - ? Remdesivir. Consult [facility]  - wean HFNC as tolerated  10. COIVD 19 Infection  - CXR w/ ? PNA. High supp O2 requirements   - ? Remdesivir. Consult [facility] for further management   Length of  Stay: 1  Brittainy Simmons, PA-C  12/28/2020, 7:47 AM  Advanced Heart Failure Team Pager [phone] (M-F; 7a - 5p)  Please contact [facility] for night-coverage after hours (5p -7a ) and weekends on [email or web address]  Patient seen with PA, agree with the above note.   Good diuresis yesterday, I/Os net negative 1596.  He says that he feels better today.  He is on 12 L HFNC this morning, I turned down to 8 L without problems.   He remains in atrial fibrillation, rate 90s-100s on diltiazem gtt 5 mg/hr.   He is COVID-19+.   General: NAD Neck: JVP 10 cm, no thyromegaly or thyroid nodule.  Lungs: Mild crackles at bases.  CV: Nondisplaced PMI.  Heart irregular S1/S2, no S3/S4, 2/6 SEM RUSB.  No peripheral edema.   Abdomen: Soft, nontender, no hepatosplenomegaly, no distention.  Skin: Intact without lesions or rashes.  Neurologic: Alert and oriented x 3.  Psych: Normal affect. Extremities: No clubbing or cyanosis.  HEENT: Normal.   Patient was admitted with atrial fibrillation/RVR + CHF exacerbation in setting of COVID-19 infection.   Atrial fibrillation: HR under better control today.  With low EF (40-45% on 6/22 TEE), will stop diltiazem gtt after he gets am digoxin and Coreg.  He has not missed Eliquis doses and we are titrating down his oxygen, plan for DCCV this afternoon . Discussed risks/benefits with him and he agrees to procedure.  Continue ranolazine, not amiodarone candidate due to toxicity.  Has had afib ablation.  Possible Tikosyn candidate in future if continues to have issues with AF.   Acute on chronic systolic CHF: TEE in 6/22 with EF 40-45%.  Admitted with volume overload, CXR with bilateral lung opacities (CHF vs COVID-19).   - Continue Lasix 80 mg IV bid today, will place PICC to follow CVP given confounding   COVID-19 infection.  - Continue home Coreg, empagliflozin, and Entresto.  - Repeat echo.  - Creatinine stable, CKD stage 3.   COVID-19 infection: This may contribute  to oxygen requirement (he is on more oxygen than I would expect with his CHF exacerbation though we are now titrating down).  - Continue to titrate down oxygen, hopefully stable for DCCV this afternoon.  - ?Remdesivir treatment, will ask for [facility] to consult re: COVID-19.   H/o amiodarone lung toxicity, he is now down to prednisone 5 mg daily (taper per outpatient pulmonary).   [name] 12/28/2020 8:39 AM

## 2020-12-28 NOTE — Anesthesia Postprocedure Evaluation (Signed)
Anesthesia Post Note  Patient: David Huynh  Procedure(s) Performed: CARDIOVERSION     Patient location during evaluation: PACU Anesthesia Type: General Level of consciousness: awake and alert Pain management: pain level controlled Vital Signs Assessment: post-procedure vital signs reviewed and stable Respiratory status: spontaneous breathing, nonlabored ventilation, respiratory function stable and patient connected to nasal cannula oxygen Cardiovascular status: blood pressure returned to baseline and stable Postop Assessment: no apparent nausea or vomiting Anesthetic complications: no   No notable events documented.  Last Vitals:  Vitals:   12/28/20 1221 12/28/20 1300  BP: 97/71 (!) 91/55  Pulse: 91 75  Resp: 18 12  Temp: (!) 36.3 C (!) 36.2 C  SpO2: 94% 93%    Last Pain:  Vitals:   12/28/20 1300  TempSrc: Temporal  PainSc: 0-No pain                 Eveline Sauve S

## 2020-12-28 NOTE — Anesthesia Preprocedure Evaluation (Signed)
Anesthesia Evaluation  Patient identified by MRN, date of birth, ID band Patient awake    Reviewed: Allergy & Precautions, NPO status , Patient's Chart, lab work & pertinent test results  Airway Mallampati: II  TM Distance: >3 FB Neck ROM: Full    Dental no notable dental hx.    Pulmonary asthma ,  covid positive   Pulmonary exam normal breath sounds clear to auscultation       Cardiovascular hypertension, + CAD, + Cardiac Stents and +CHF  + dysrhythmias Atrial Fibrillation + Valvular Problems/Murmurs AS  Rhythm:Irregular Rate:Tachycardia + Systolic murmurs  Acute on Chronic systolic=>diastolic CHF: Prior ischemic cardiomyopathy, improved after PCI in 2017.  Echo 03/2018 with EF back down to 35-40% and moderate-severe RV dysfunction. This was in the setting of atrial fibrillation with RVR. EF 40% on TEE 04/2018.  Echo was in 3/20, showing EF up to 50%.  Suspect that he primarily had a tachycardia-mediated CMP at that point.  Echo in 7/21 with EF 25-30%, severe RV dysfunction.  TEE in 10/21, however, showed EF up to 50%.  He had PCI to LAD in 10/21. Echo in 3/22 showed stable EF 50-55% with normal RV.  TEE 6/22 at time of Afib ablation showed stable EF 50-55% w/ normal RV. Now w/ acute on chronic CHF in setting of recurrent afib w/ RVR. Mildly fluid overloaded. NYHA Class II-III. Bedside Echo shows mildly reduced EF now 40-45%   Neuro/Psych negative neurological ROS  negative psych ROS   GI/Hepatic negative GI ROS, Neg liver ROS,   Endo/Other  diabetes  Renal/GU Renal InsufficiencyRenal disease  negative genitourinary   Musculoskeletal negative musculoskeletal ROS (+)   Abdominal   Peds negative pediatric ROS (+)  Hematology negative hematology ROS (+)   Anesthesia Other Findings   Reproductive/Obstetrics negative OB ROS                             Anesthesia Physical Anesthesia Plan  ASA:  3  Anesthesia Plan: General   Post-op Pain Management:    Induction: Intravenous  PONV Risk Score and Plan: Treatment may vary due to age or medical condition  Airway Management Planned: Mask  Additional Equipment:   Intra-op Plan:   Post-operative Plan: Extubation in OR  Informed Consent: I have reviewed the patients History and Physical, chart, labs and discussed the procedure including the risks, benefits and alternatives for the proposed anesthesia with the patient or authorized representative who has indicated his/her understanding and acceptance.     Dental advisory given  Plan Discussed with: CRNA and Surgeon  Anesthesia Plan Comments:         Anesthesia Quick Evaluation

## 2020-12-29 ENCOUNTER — Encounter (HOSPITAL_COMMUNITY): Payer: Self-pay

## 2020-12-29 ENCOUNTER — Encounter (HOSPITAL_COMMUNITY): Payer: Self-pay | Admitting: Cardiology

## 2020-12-29 DIAGNOSIS — I5043 Acute on chronic combined systolic (congestive) and diastolic (congestive) heart failure: Secondary | ICD-10-CM | POA: Diagnosis not present

## 2020-12-29 DIAGNOSIS — I4891 Unspecified atrial fibrillation: Secondary | ICD-10-CM | POA: Diagnosis not present

## 2020-12-29 DIAGNOSIS — U071 COVID-19: Secondary | ICD-10-CM

## 2020-12-29 LAB — COMPREHENSIVE METABOLIC PANEL
ALT: 83 U/L — ABNORMAL HIGH (ref 0–44)
AST: 90 U/L — ABNORMAL HIGH (ref 15–41)
Albumin: 2.2 g/dL — ABNORMAL LOW (ref 3.5–5.0)
Alkaline Phosphatase: 70 U/L (ref 38–126)
Anion gap: 9 (ref 5–15)
BUN: 30 mg/dL — ABNORMAL HIGH (ref 8–23)
CO2: 26 mmol/L (ref 22–32)
Calcium: 8.2 mg/dL — ABNORMAL LOW (ref 8.9–10.3)
Chloride: 100 mmol/L (ref 98–111)
Creatinine, Ser: 1.55 mg/dL — ABNORMAL HIGH (ref 0.61–1.24)
GFR, Estimated: 45 mL/min — ABNORMAL LOW (ref >60)
Glucose, Bld: 393 mg/dL — ABNORMAL HIGH (ref 70–99)
Potassium: 4.4 mmol/L (ref 3.5–5.1)
Sodium: 135 mmol/L (ref 135–145)
Total Bilirubin: 1 mg/dL (ref 0.3–1.2)
Total Protein: 6.6 g/dL (ref 6.5–8.1)

## 2020-12-29 LAB — CBC WITH DIFFERENTIAL/PLATELET
Abs Immature Granulocytes: 0.08 10*3/uL — ABNORMAL HIGH (ref 0.00–0.07)
Basophils Absolute: 0 10*3/uL (ref 0.0–0.1)
Basophils Relative: 0 %
Eosinophils Absolute: 0 10*3/uL (ref 0.0–0.5)
Eosinophils Relative: 0 %
HCT: 37.1 % — ABNORMAL LOW (ref 39.0–52.0)
Hemoglobin: 11.2 g/dL — ABNORMAL LOW (ref 13.0–17.0)
Immature Granulocytes: 1 %
Lymphocytes Relative: 13 %
Lymphs Abs: 0.8 10*3/uL (ref 0.7–4.0)
MCH: 28.9 pg (ref 26.0–34.0)
MCHC: 30.2 g/dL (ref 30.0–36.0)
MCV: 95.6 fL (ref 80.0–100.0)
Monocytes Absolute: 0.2 10*3/uL (ref 0.1–1.0)
Monocytes Relative: 3 %
Neutro Abs: 5.3 10*3/uL (ref 1.7–7.7)
Neutrophils Relative %: 83 %
Platelets: 516 10*3/uL — ABNORMAL HIGH (ref 150–400)
RBC: 3.88 MIL/uL — ABNORMAL LOW (ref 4.22–5.81)
RDW: 14 % (ref 11.5–15.5)
WBC: 6.5 10*3/uL (ref 4.0–10.5)
nRBC: 0 % (ref 0.0–0.2)

## 2020-12-29 LAB — MAGNESIUM: Magnesium: 2.5 mg/dL — ABNORMAL HIGH (ref 1.7–2.4)

## 2020-12-29 LAB — GLUCOSE, CAPILLARY
Glucose-Capillary: 377 mg/dL — ABNORMAL HIGH (ref 70–99)
Glucose-Capillary: 374 mg/dL — ABNORMAL HIGH (ref 70–99)
Glucose-Capillary: 404 mg/dL — ABNORMAL HIGH (ref 70–99)
Glucose-Capillary: 213 mg/dL — ABNORMAL HIGH (ref 70–99)
Glucose-Capillary: 267 mg/dL — ABNORMAL HIGH (ref 70–99)

## 2020-12-29 LAB — D-DIMER, QUANTITATIVE: D-Dimer, Quant: 2.19 ug/mL-FEU — ABNORMAL HIGH (ref 0.00–0.50)

## 2020-12-29 LAB — FERRITIN: Ferritin: 232 ng/mL (ref 24–336)

## 2020-12-29 LAB — PHOSPHORUS: Phosphorus: 5.3 mg/dL — ABNORMAL HIGH (ref 2.5–4.6)

## 2020-12-29 LAB — C-REACTIVE PROTEIN: CRP: 26.3 mg/dL — ABNORMAL HIGH (ref <1.0)

## 2020-12-29 MED ORDER — INSULIN DETEMIR 100 UNIT/ML ~~LOC~~ SOLN
15.0000 [IU] | Freq: Every day | SUBCUTANEOUS | Status: DC
  Administered 2020-12-29 – 2020-12-31 (×3): 15 [IU] via SUBCUTANEOUS
  Filled 2020-12-29 (×3): qty 0.15

## 2020-12-29 MED ORDER — IPRATROPIUM-ALBUTEROL 20-100 MCG/ACT IN AERS: 1.0000 | INHALATION_SPRAY | Freq: Four times a day (QID) | RESPIRATORY_TRACT | Status: DC | PRN

## 2020-12-29 MED ORDER — TORSEMIDE 20 MG PO TABS
20.0000 mg | ORAL_TABLET | Freq: Every day | ORAL | Status: DC
  Administered 2020-12-29 – 2020-12-30 (×2): 20 mg via ORAL
  Filled 2020-12-29 (×2): qty 1

## 2020-12-29 MED ORDER — INSULIN ASPART 100 UNIT/ML IJ SOLN
4.0000 [IU] | Freq: Three times a day (TID) | INTRAMUSCULAR | Status: DC
  Administered 2020-12-29 – 2020-12-31 (×6): 4 [IU] via SUBCUTANEOUS

## 2020-12-29 NOTE — TOC CM/SW Note (Signed)
HF TOC CM spoke to pt's wife and oxygen is with [employer]. Message sent to [employer] to follow up on orders needed to recertify at home. Wife states he has not used in a long while. Contacted [facility] with new referral for [facility] RN. Will need orders for [facility] with F2F. HH PT will provided through [name] [employer], pt was going to exercises at gym. Pt will not be able to return until negative for COVID. Isidoro Donning RN3 CCM, Heart Failure TOC CM [phone]

## 2020-12-29 NOTE — Evaluation (Signed)
Physical Therapy Evaluation Patient Details Name: David Huynh MRN: [id number] DOB: November 13, 1940 Today's Date: 12/29/2020   History of Present Illness  The pt is a 80 yo male presenting 8/15 with c/o SOB. Pt tested positive for COVID in ED, required up to 12L HFNC. PMH includes: recent ablation, however he has since returned to afib, CAD, CHF, CKD III, DM II, HLD, HTN, obesity, and chronic lung inflammation.   Clinical Impression  Pt in bed upon arrival of PT, agreeable to evaluation at this time. Prior to admission the pt was mobilizing with use of 4-wheel walker for within-home distances, but was independent with all ADLs and IADLs. The pt now presents with limitations in functional mobility, endurance, and dynamic stability due to above dx, and would benefit from skilled PT following d/c to evaluate safe progression of return to exercise and activity. However, the pt is mobilizing on supervision-modI level at present with use of RW, and is adamant about not desiring continued efforts with therapy. The pt has all needed DME at this time other than supplemental O2. He required 3L to maintain SpO2 > 90s while at rest, and up to 4L to recover following short bout of ambulation in the room.     Follow Up Recommendations Supervision for mobility/OOB;No PT follow up  (Given the pt's goal of returning to exercise at [facility] gym, I recommend PT to evaluate safe progression of activity given SpO2 drop with activity, pt declined need for any therapy stating all prior therapy has been "useless" for him)    Equipment Recommendations  Other (comment) (oxygen)    Recommendations for Other Services       Precautions / Restrictions Precautions Precautions: Other (comment) Precaution Comments: pt with quick desat with activity to low 80s on 3L Restrictions Weight Bearing Restrictions: No      Mobility  Bed Mobility Overal bed mobility: Independent             General bed mobility  comments: independent without physical assist, SpO2 stable    Transfers Overall transfer level: Modified independent Equipment used: Rolling walker (2 wheeled)             General transfer comment: pt completed multiple times with use of RW, no LOB on initial stand, SpO2 to 80s on 3L with initial stand  Ambulation/Gait Ambulation/Gait assistance: Supervision Gait Distance (Feet): 45 Feet Assistive device: Rolling walker (2 wheeled) Gait Pattern/deviations: Step-through pattern     General Gait Details: generally WFL, reliant on BUE support, needing 3L for most of ambulaiton, up to 4L to recover. SpO2 low of 82%      Balance Overall balance assessment: Mild deficits observed, not formally tested                                           Pertinent Vitals/Pain Pain Assessment: No/denies pain    Home Living Family/patient expects to be discharged to:: Private residence Living Arrangements: Spouse/significant other Available Help at Discharge: Available 24 hours/day Type of Home: House Home Access: Level entry     Home Layout: One level Home Equipment: Walker - 4 wheels;Shower seat Additional Comments: just moved into ILF at [facility] stone    Prior Function Level of Independence: Independent         Comments: pt reports using 4wheel walker for within-home distances     Hand Dominance   Dominant Hand:  Right    Extremity/Trunk Assessment   Upper Extremity Assessment Upper Extremity Assessment: Overall WFL for tasks assessed    Lower Extremity Assessment Lower Extremity Assessment: Overall WFL for tasks assessed    Cervical / Trunk Assessment Cervical / Trunk Assessment: Normal  Communication   Communication: No difficulties  Cognition Arousal/Alertness: Awake/alert Behavior During Therapy: Agitated Overall Cognitive Status: Within Functional Limits for tasks assessed                                 General Comments: pt  irritated by PT session, stating he has no needs. slightly decreased insight to importance of monitoring and addressing SpO2 levels      General Comments General comments (skin integrity, edema, etc.): SpO2 dropped to low of 82% on 3L aith activity in room, 4L given for recovery to 90s    Exercises     Assessment/Plan    PT Assessment Patent does not need any further PT services  PT Problem List         PT Treatment Interventions      PT Goals (Current goals can be found in the Care Plan section)  Acute Rehab PT Goals Patient Stated Goal: to get back to working out on his own PT Goal Formulation: With patient Time For Goal Achievement: 01/05/21 Potential to Achieve Goals: Fair     AM-PAC PT "6 Clicks" Mobility  Outcome Measure Help needed turning from your back to your side while in a flat bed without using bedrails?: None Help needed moving from lying on your back to sitting on the side of a flat bed without using bedrails?: None Help needed moving to and from a bed to a chair (including a wheelchair)?: A Little Help needed standing up from a chair using your arms (e.g., wheelchair or bedside chair)?: A Little Help needed to walk in hospital room?: A Little Help needed climbing 3-5 steps with a railing? : A Little 6 Click Score: 20    End of Session Equipment Utilized During Treatment: Gait belt;Oxygen Activity Tolerance: Patient tolerated treatment well Patient left: in bed;with call bell/phone within reach Nurse Communication: Mobility status PT Visit Diagnosis: Other abnormalities of gait and mobility (R26.89)    Time: 4431-5400 PT Time Calculation (min) (ACUTE ONLY): 26 min   Charges:   PT Evaluation $PT Eval Low Complexity: 1 Low PT Treatments $Gait Training: 8-22 mins        Lazarus Gowda, PT, DPT   Acute Rehabilitation Department Pager #: [phone]  Ronnie Derby 12/29/2020, 3:35 PM

## 2020-12-29 NOTE — Progress Notes (Signed)
Inpatient Diabetes Program Recommendations  AACE/ADA: New Consensus Statement on Inpatient Glycemic Control   Target Ranges:  Prepandial:   less than 140 mg/dL      Peak postprandial:   less than 180 mg/dL (1-2 hours)      Critically ill patients:  140 - 180 mg/dL   Results for CLARA, SMOLEN (MRN [id number]) as of 12/29/2020 09:50  Ref. Range 12/28/2020 05:57 12/28/2020 11:45 12/28/2020 16:20 12/28/2020 21:12 12/29/2020 06:15 12/29/2020 08:06  Glucose-Capillary Latest Ref Range: 70 - 99 mg/dL 563 (H) 875 (H) 643 (H) 226 (H) 377 (H) 374 (H)    Review of Glycemic Control  Diabetes history: DM2 Outpatient Diabetes medications: Lantus 10 units QHS, Humalog (no dose or frequency noted), Metformin 1000 mg QAM, Jardiance 25 mg QAM Current orders for Inpatient glycemic control: Novolog 0-20 TID with meals, Jardiance 25 mg QAM; Decadron 6 mg Q24H  Inpatient Diabetes Program Recommendations:    Insulin: If steroids are continued, please consider ordering Semglee 15 units Q24H and Novolog 4 units TID with meals for meal coverage if patient eats at least 50% of meals.  Thanks, Orlando Penner, RN, MSN, CDE Diabetes Coordinator Inpatient Diabetes Program [phone] (Team Pager from 8am to 5pm)

## 2020-12-29 NOTE — Progress Notes (Signed)
PROGRESS NOTE    David Huynh  TMH:[id number] DOB: [birth year] DOA: 12/27/2020 PCP: Bradd Canary, MD    Brief Narrative:   80 y.o. male with medical history significant of hypertension, hyperlipidemia, systolic CHF, atrial fibrillation, CAD, CKD stage III, and amiodarone lung toxicity who presents with progressive shortness of breath starting 2-3 days ago.  Notes associated symptoms of a nonproductive cough.  Patient had underwent ablation with Dr. Johney Frame in June for atrial fibrillation, but reports that he goes in and out of atrial fibrillation still.  Symptoms were thought to be secondary to a CHF exacerbation.  He reported last testing negative for COVID-19 with that home test on 8/13.  Labs significant for BNP of 506.  Chest x-ray noted cardiomegaly with increased bilateral perihilar basilar opacities.  COVID-19 screening was noted to be positive.  He has been vaccinated and boosted twice against COVID-19.  Patient was started on IV diuretics with good diuresis, but patient requiring high flow nasal cannula oxygen. Patient admitted with heart failure team.  TRH consulted for COVID-19 infection.  Assessment & Plan:   Active Problems:   Acute on chronic combined systolic and diastolic CHF (congestive heart failure) (HCC)  Acute on chronic combined heart failure: Clinically improving as per cardiology. Followed by heart failure team.  Currently on advanced heart failure regimen with IV Lasix and Entresto. Urine output 3500 mL last 24 hours. On Lasix 80 mg IV twice daily Entresto  Pneumonia due to COVID-19 infection with hypoxemia: His hypoxemia is multifactorial.  He also has baseline amiodarone toxicity and currently on maintenance prednisone.  Exacerbated by COVID-19 infection and congestive heart failure. Clinically stabilizing. chest physiotherapy, incentive spirometry, deep breathing exercises, sputum induction, mucolytic's and bronchodilators. Supplemental oxygen to keep  saturations more than 90%. Covid directed therapy with , steroids, on dexamethasone.  Will continue for 10 days. remdesivir, day 2/3. Baricitinib, day 2.  We will continue until clinical improvement or discharge. antibiotics, not indicated.  SpO2: 91 % O2 Flow Rate (L/min): 5 L/min  COVID-19 Labs  Recent Labs    12/28/20 1415 12/29/20 0340  DDIMER  --  2.19*  FERRITIN  --  232  CRP 25.7* 26.3*    Lab Results  Component Value Date   SARSCOV2NAA POSITIVE (A) 12/27/2020   SARSCOV2NAA NEGATIVE 08/09/2020   SARSCOV2NAA NEGATIVE 02/28/2020   SARSCOV2NAA NEGATIVE 12/06/2019     Paroxysmal atrial fibrillation: Status post cardioversion 8/16.  Sinus rhythm now.  Coronary artery disease: Stable.  Type 2 diabetes: Blood sugars uncontrolled due to ongoing use of steroids and also holding on his long-acting insulin. Start Levemir 15 units, start NovoLog 4 units with meals.  Keep on aggressive sliding scale insulin.  We will continue to uptitrate.  Will need more insulin when on steroids.  CKD stage IIIa: Stable at about baseline.  Thank you for involving [place] in this patient's care.  We will continue to manage along with you in regards to his COVID-19 pneumonia and diabetes.   DVT prophylaxis:  apixaban (ELIQUIS) tablet 5 mg   Code Status: Full code Family Communication: None Disposition Plan: Status is: Inpatient  Remains inpatient appropriate because:IV treatments appropriate due to intensity of illness or inability to take PO and Inpatient level of care appropriate due to severity of illness  Dispo: The patient is from: Home              Anticipated d/c is to: Home  Patient currently is not medically stable to d/c.   Difficult to place patient No         Consultants:  Cardiology  Procedures:  None  Antimicrobials:  Remdesivir 8/16----   Subjective: Patient seen and examined.  Today he denies any complaints.  He was very motivated and is looking  forward to go home.  He is on 3 to 4 L of oxygen, however does not feel any shortness of breath.  Denies any cough congestion or sputum production. Patient does have oxygen at home since last time he had amiodarone induced lung injury. Remains afebrile. Discussed with cardiology team.  Objective: Vitals:   12/28/20 1711 12/28/20 2008 12/28/20 2348 12/29/20 0340  BP:  119/84 117/70 112/69  Pulse:  76 72 65  Resp:  (!) 22 (!) 21 (!) 21  Temp:  97.7 F (36.5 C) 97.6 F (36.4 C) 97.6 F (36.4 C)  TempSrc:  Axillary Oral Oral  SpO2: 94% 95% 95% 91%  Weight:    112.2 kg  Height:        Intake/Output Summary (Last 24 hours) at 12/29/2020 0723 Last data filed at 12/29/2020 0634 Gross per 24 hour  Intake 2821.1 ml  Output 3525 ml  Net -703.9 ml   Filed Weights   12/28/20 0553 12/28/20 1221 12/29/20 0340  Weight: 112 kg 112 kg 112.2 kg    Examination:  General exam: Appears calm and comfortable , not in any distress. Respiratory system: Bilateral clear.  No added sounds.  On 4 to 5 L of oxygen.  Looks comfortable and talks in complete sentences. Cardiovascular system: S1 & S2 heard, RRR.  Gastrointestinal system: Abdomen is nondistended, soft and nontender. No organomegaly or masses felt. Normal bowel sounds heard. Central nervous system: Alert and oriented. No focal neurological deficits. Extremities: Symmetric 5 x 5 power. Skin: No rashes, lesions or ulcers Psychiatry: Judgement and insight appear normal. Mood & affect appropriate.     Data Reviewed: I have personally reviewed following labs and imaging studies  CBC: Recent Labs  Lab 12/27/20 1336 12/29/20 0340  WBC 11.5* 6.5  NEUTROABS  --  5.3  HGB 12.2* 11.2*  HCT 38.8* 37.1*  MCV 93.9 95.6  PLT 506* 516*   Basic Metabolic Panel: Recent Labs  Lab 12/27/20 1336 12/27/20 1409 12/28/20 0232 12/29/20 0340  NA 135  --  139 135  K 4.0  --  3.5 4.4  CL 102  --  103 100  CO2 22  --  25 26  GLUCOSE 263*  --   179* 393*  BUN 22  --  19 30*  CREATININE 1.57*  --  1.44* 1.55*  CALCIUM 8.5*  --  8.5* 8.2*  MG  --  2.3  --  2.5*  PHOS  --   --   --  5.3*   GFR: Estimated Creatinine Clearance: 51.5 mL/min (A) (by C-G formula based on SCr of 1.55 mg/dL (H)). Liver Function Tests: Recent Labs  Lab 12/28/20 1415 12/29/20 0340  AST 46* 90*  ALT 54* 83*  ALKPHOS 67 70  BILITOT 1.3* 1.0  PROT 6.7 6.6  ALBUMIN 2.2* 2.2*   No results for input(s): LIPASE, AMYLASE in the last 168 hours. No results for input(s): AMMONIA in the last 168 hours. Coagulation Profile: No results for input(s): INR, PROTIME in the last 168 hours. Cardiac Enzymes: No results for input(s): CKTOTAL, CKMB, CKMBINDEX, TROPONINI in the last 168 hours. BNP (last 3 results) No results for input(s):  PROBNP in the last 8760 hours. HbA1C: No results for input(s): HGBA1C in the last 72 hours. CBG: Recent Labs  Lab 12/28/20 0557 12/28/20 1145 12/28/20 1620 12/28/20 2112 12/29/20 0615  GLUCAP 220* 150* 260* 226* 377*   Lipid Profile: No results for input(s): CHOL, HDL, LDLCALC, TRIG, CHOLHDL, LDLDIRECT in the last 72 hours. Thyroid Function Tests: No results for input(s): TSH, T4TOTAL, FREET4, T3FREE, THYROIDAB in the last 72 hours. Anemia Panel: Recent Labs    12/29/20 0340  FERRITIN 232   Sepsis Labs: Recent Labs  Lab 12/28/20 1415  PROCALCITON <0.10    Recent Results (from the past 240 hour(s))  Resp Panel by RT-PCR (Flu A&B, Covid) Nasopharyngeal Swab     Status: Abnormal   Collection Time: 12/27/20  4:12 PM   Specimen: Nasopharyngeal Swab; Nasopharyngeal(NP) swabs in vial transport medium  Result Value Ref Range Status   SARS Coronavirus 2 by RT PCR POSITIVE (A) NEGATIVE Final    Comment: RESULT CALLED TO, READ BACK BY AND VERIFIED WITH: B BECK RN 12/27/20 1832 JDW (NOTE) SARS-CoV-2 target nucleic acids are DETECTED.  The SARS-CoV-2 RNA is generally detectable in upper respiratory specimens during the  acute phase of infection. Positive results are indicative of the presence of the identified virus, but do not rule out bacterial infection or co-infection with other pathogens not detected by the test. Clinical correlation with patient history and other diagnostic information is necessary to determine patient infection status. The expected result is Negative.  Fact Sheet for Patients: BloggerCourse.com  Fact Sheet for Healthcare Providers: SeriousBroker.it  This test is not yet approved or cleared by the [place] FDA and  has been authorized for detection and/or diagnosis of SARS-CoV-2 by FDA under an Emergency Use Authorization (EUA).  This EUA will remain in effect (meaning this test can be used)  for the duration of  the COVID-19 declaration under Section 564(b)(1) of the Act, 21 U.S.C. section 360bbb-3(b)(1), unless the authorization is terminated or revoked sooner.     Influenza A by PCR NEGATIVE NEGATIVE Final   Influenza B by PCR NEGATIVE NEGATIVE Final    Comment: (NOTE) The Xpert Xpress SARS-CoV-2/FLU/RSV plus assay is intended as an aid in the diagnosis of influenza from Nasopharyngeal swab specimens and should not be used as a sole basis for treatment. Nasal washings and aspirates are unacceptable for Xpert Xpress SARS-CoV-2/FLU/RSV testing.  Fact Sheet for Patients: BloggerCourse.com  Fact Sheet for Healthcare Providers: SeriousBroker.it  This test is not yet approved or cleared by the [place] FDA and has been authorized for detection and/or diagnosis of SARS-CoV-2 by FDA under an Emergency Use Authorization (EUA). This EUA will remain in effect (meaning this test can be used) for the duration of the COVID-19 declaration under Section 564(b)(1) of the Act, 21 U.S.C. section 360bbb-3(b)(1), unless the authorization is terminated or revoked.  Performed at  [facility], [address], [place], [place] [address]   MRSA Next Gen by PCR, Nasal     Status: None   Collection Time: 12/27/20  9:33 PM   Specimen: Nasal Mucosa; Nasal Swab  Result Value Ref Range Status   MRSA by PCR Next Gen NOT DETECTED NOT DETECTED Final    Comment: (NOTE) The GeneXpert MRSA Assay (FDA approved for NASAL specimens only), is one component of a comprehensive MRSA colonization surveillance program. It is not intended to diagnose MRSA infection nor to guide or monitor treatment for MRSA infections. Test performance is not FDA approved  in patients less than 79 years old. Performed at [facility], [address] [place], [place] [address]          Radiology Studies: DG CHEST PORT 1 VIEW  Result Date: 12/28/2020 CLINICAL DATA:  PICC line placement. EXAM: PORTABLE CHEST 1 VIEW COMPARISON:  Chest x-ray from yesterday. FINDINGS: New right upper extremity PICC line with tip in the distal SVC. Stable cardiomegaly. Unchanged bilateral airspace disease. No pneumothorax. No acute osseous abnormality. IMPRESSION: 1. New right upper extremity PICC line with tip in the distal SVC. 2. Unchanged extensive bilateral airspace disease. Electronically Signed   By: Obie DredgeWilliam T Derry M.D.   On: 12/28/2020 11:29   DG Chest Portable 1 View  Result Date: 12/27/2020 CLINICAL DATA:  Dyspnea with exertion. EXAM: PORTABLE CHEST 1 VIEW COMPARISON:  November 24, 2020. FINDINGS: Stable cardiomegaly. No pneumothorax is noted. Increased bilateral perihilar and basilar opacities are noted concerning for pneumonia or possibly edema. Bony thorax is unremarkable. IMPRESSION: Increased bilateral lung opacities as described above. Electronically Signed   By: Lupita RaiderJames  Green Jr M.D.   On: 12/27/2020 14:00   ECHOCARDIOGRAM COMPLETE  Result Date: 12/28/2020    ECHOCARDIOGRAM REPORT   Patient Name:   David Huynh Date of Exam: 12/28/2020 Medical Rec #:  [id number]         Height:       74.0 in  Accession #:    [id number]        Weight:       246.9 lb Date of Birth:  March 07, 1941         BSA:          2.377 m Patient Age:    80 years          BP:           112/67 mmHg Patient Gender: M                 HR:           71 bpm. Exam Location:  Inpatient Procedure: 2D Echo, Color Doppler and Cardiac Doppler Indications:    CHF-Acute Systolic  History:        Patient has prior history of Echocardiogram examinations, most                 recent 10/26/2020. CHF, CAD, Aortic Valve Disease; Risk                 Factors:Hypertension, Diabetes and Dyslipidemia. CKD.  Sonographer:    Ross LudwigArthur Guy RDCS (AE) Referring Phys: [id number] Roxy HorsemanBRITTAINY M SIMMONS  Sonographer Comments: Suboptimal subcostal window. IMPRESSIONS  1. Inferior basal hypokinesis . Left ventricular ejection fraction, by estimation, is 45 to 50%. The left ventricle has mildly decreased function. The left ventricle has no regional wall motion abnormalities. There is moderate left ventricular hypertrophy. Left ventricular diastolic parameters are consistent with Grade II diastolic dysfunction (pseudonormalization). Elevated left ventricular end-diastolic pressure.  2. Right ventricular systolic function is normal. The right ventricular size is normal.  3. Left atrial size was moderately dilated.  4. Right atrial size was mildly dilated.  5. The mitral valve is abnormal. Mild mitral valve regurgitation. No evidence of mitral stenosis.  6. Likely moderate low flow AS with DVI and AVA low 0.24 and 0.90 cm2 . The aortic valve is tricuspid. There is moderate calcification of the aortic valve. There is moderate thickening of the aortic valve. Aortic valve regurgitation is mild. Moderate aortic valve stenosis.  7.  Aortic dilatation noted. There is mild dilatation of the aortic root, measuring 38 mm.  8. The inferior vena cava is normal in size with greater than 50% respiratory variability, suggesting right atrial pressure of 3 mmHg. FINDINGS  Left Ventricle: Inferior basal  hypokinesis. Left ventricular ejection fraction, by estimation, is 45 to 50%. The left ventricle has mildly decreased function. The left ventricle has no regional wall motion abnormalities. The left ventricular internal cavity size was normal in size. There is moderate left ventricular hypertrophy. Left ventricular diastolic parameters are consistent with Grade II diastolic dysfunction (pseudonormalization). Elevated left ventricular end-diastolic pressure. Right Ventricle: The right ventricular size is normal. No increase in right ventricular wall thickness. Right ventricular systolic function is normal. Left Atrium: Left atrial size was moderately dilated. Right Atrium: Right atrial size was mildly dilated. Pericardium: There is no evidence of pericardial effusion. Mitral Valve: The mitral valve is abnormal. There is moderate thickening of the mitral valve leaflet(s). There is moderate calcification of the mitral valve leaflet(s). Mild mitral annular calcification. Mild mitral valve regurgitation. No evidence of mitral valve stenosis. MV peak gradient, 6.9 mmHg. The mean mitral valve gradient is 2.0 mmHg. Tricuspid Valve: The tricuspid valve is normal in structure. Tricuspid valve regurgitation is not demonstrated. No evidence of tricuspid stenosis. Aortic Valve: Likely moderate low flow AS with DVI and AVA low 0.24 and 0.90 cm2. The aortic valve is tricuspid. There is moderate calcification of the aortic valve. There is moderate thickening of the aortic valve. Aortic valve regurgitation is mild. Moderate aortic stenosis is present. Aortic valve mean gradient measures 14.3 mmHg. Aortic valve peak gradient measures 26.1 mmHg. Aortic valve area, by VTI measures 0.90 cm. Pulmonic Valve: The pulmonic valve was normal in structure. Pulmonic valve regurgitation is not visualized. No evidence of pulmonic stenosis. Aorta: The aortic root is normal in size and structure and aortic dilatation noted. There is mild  dilatation of the aortic root, measuring 38 mm. Venous: The inferior vena cava is normal in size with greater than 50% respiratory variability, suggesting right atrial pressure of 3 mmHg. IAS/Shunts: The interatrial septum was not well visualized.  LEFT VENTRICLE PLAX 2D LVIDd:         4.70 cm  Diastology LVIDs:         3.30 cm  LV e' medial:    6.53 cm/s LV PW:         1.50 cm  LV E/e' medial:  20.5 LV IVS:        1.60 cm  LV e' lateral:   5.19 cm/s LVOT diam:     2.20 cm  LV E/e' lateral: 25.8 LV SV:         49 LV SV Index:   21 LVOT Area:     3.80 cm  RIGHT VENTRICLE RV Basal diam:  4.10 cm RV S prime:     11.70 cm/s TAPSE (M-mode): 2.0 cm LEFT ATRIUM             Index       RIGHT ATRIUM           Index LA diam:        4.30 cm 1.81 cm/m  RA Area:     34.40 cm LA Vol (A2C):   78.6 ml 33.07 ml/m RA Volume:   124.00 ml 52.16 ml/m LA Vol (A4C):   76.4 ml 32.14 ml/m LA Biplane Vol: 81.6 ml 34.33 ml/m  AORTIC VALVE AV Area (Vmax):    0.96  cm AV Area (Vmean):   0.90 cm AV Area (VTI):     0.90 cm AV Vmax:           255.67 cm/s AV Vmean:          179.000 cm/s AV VTI:            0.547 m AV Peak Grad:      26.1 mmHg AV Mean Grad:      14.3 mmHg LVOT Vmax:         64.70 cm/s LVOT Vmean:        42.300 cm/s LVOT VTI:          0.129 m LVOT/AV VTI ratio: 0.24  AORTA Ao Root diam: 3.80 cm Ao Asc diam:  3.70 cm MITRAL VALVE                 TRICUSPID VALVE MV Area (PHT): 3.66 cm      TR Peak grad:   26.0 mmHg MV Area VTI:   1.50 cm      TR Vmax:        255.00 cm/s MV Peak grad:  6.9 mmHg MV Mean grad:  2.0 mmHg      SHUNTS MV Vmax:       1.31 m/s      Systemic VTI:  0.13 m MV Vmean:      69.4 cm/s     Systemic Diam: 2.20 cm MV Decel Time: 207 msec MR Peak grad:    100.8 mmHg MR Mean grad:    69.0 mmHg MR Vmax:         502.00 cm/s MR Vmean:        396.0 cm/s MR PISA:         1.01 cm MR PISA Eff ROA: 6 mm MR PISA Radius:  0.40 cm MV E velocity: 134.00 cm/s MV A velocity: 30.50 cm/s MV E/A ratio:  4.39 Charlton Haws MD  Electronically signed by Charlton Haws MD Signature Date/Time: 12/28/2020/3:39:54 PM    Final    [place] EKG SITE RITE  Result Date: 12/28/2020 If Site Rite image not attached, placement could not be confirmed due to current cardiac rhythm.       Scheduled Meds:  apixaban  5 mg Oral BID   vitamin C  500 mg Oral Daily   baricitinib  2 mg Oral Daily   carvedilol  12.5 mg Oral BID WC   Chlorhexidine Gluconate Cloth  6 each Topical Daily   dexamethasone (DECADRON) injection  6 mg Intravenous Q24H   digoxin  0.125 mg Oral Daily   empagliflozin  25 mg Oral QAC breakfast   ezetimibe  10 mg Oral Daily   furosemide  80 mg Intravenous BID   icosapent Ethyl  2 g Oral BID   insulin aspart  0-20 Units Subcutaneous TID WC   pantoprazole  40 mg Oral Daily   ranolazine  500 mg Oral BID   rosuvastatin  40 mg Oral Daily   sacubitril-valsartan  1 tablet Oral BID   sodium chloride flush  10-40 mL Intracatheter Q12H   sodium chloride flush  3 mL Intravenous Q12H   zinc sulfate  220 mg Oral Daily   Continuous Infusions:  sodium chloride     remdesivir 100 mg in NS 100 mL       LOS: 2 days    Time spent: 25 minutes    Dorcas Carrow, MD [facility] Pager [phone]

## 2020-12-29 NOTE — Progress Notes (Signed)
Patient will need home O2, patient dropped to 85% on room air while standing at bedside. Recovered on 3L to 92% after a few mins.

## 2020-12-29 NOTE — Discharge Summary (Addendum)
Advanced Heart Failure Team  Discharge Summary   Patient ID: David Huynh MRN: [id number], DOB/AGE: [birth year] 80 y.o. Admit date: 12/27/2020 D/C date:     12/31/2020   Primary Discharge Diagnoses:  Atrial fibrillation with RVR Acute on chronic systolic and diastolic CHF CKD stage III CAD DM2 Aortic valve stenosis Acute on chronic hypoxic respiratory failure Amiodarone long toxicity COVID-19 infection   Hospital Course: David Huynh is a 80 y.o. male with a history of CAD, chronic systolic CHF, HTN, HLD, DM, CKD 3, paroxysmal  afib and asthma. Patient had EF 35-40% in 9/17, had cath with DES to RCA then repeat echo with EF up to 55-60% by 3/18.    Admitted 03/2018 with SOB x 3 weeks. No cardiology follow up in >1 year. Found to be in atrial fibrillation with RVR. EF down to 35-40%. Poor diuresis with IV lasix, so HF team consulted. He was started on amiodarone drip. PICC line placed with CVP markedly elevated and Coox marginal. Held off on inotropes with concern for making afib worse. Started on lasix drip. He started eliquis and had TEE/DCCV 03/29/18. Was converted to NSR, but shortly after went back into AF. Ranexa added. Attempted repeat DCCV 04/02/18, but was unsuccessful. Had [facility] 04/03/18 with PCI to proximal LCx.  He then had TEE-DCCV on 04/19/18 back to NSR. EF on TEE was 40%.    He was lost to followup for over a year.  In 7/21, he was admitted for a couple of days with CHF exacerbation and was treated with IV Lasix.  Echo 7/21, EF back down to 25-30%, global hypokinesis, severely decreased RV systolic function with severe RV enlargement, at least moderate aortic stenosis.  He was in NSR.     TEE 10/21 with EF up to 50%, moderate AS and moderate MR.  LHC/RHC showed normal filling pressures and significant LAD disease treated with PCI/DES.   He was admitted on [facility] on 08/10/20 with atrial fibrillation/RVR and CHF.  Amiodarone was started and he was cardioverted to NSR.  Echo in  3/22 showed EF 50-55%.  He developed acute amiodarone lung toxicity with elevated ESR and patchy bilateral infiltrates.  Amiodarone was stopped and prednisone was started.  Oxygen saturation gradually increased over time and he was eventually able to wean off oxygen. He was referred back to EP and underwent Afib ablation 10/26/20 by Dr. Rayann Heman. TEE at time of EP study showed LVEF 50-55%, mild-mod MR, mild AI.    He was seen in afib clinic on 11/23/20 and was noted to be back in Afib w/ RVR. He was continued on Coreg 12.5 mg bid (no dose change) and Eliquis w/ plans to return for f/u and plan DCCV if still in persistent Afib. Pt can feel when he goes in and out of Afib. He reports that he felt that he returned to NSR ~3-4 days after his Afib clinic appt but then reverted back to Afib about 1 week later and had been in Afib ever since.    Patient admitted on 08/15 with a/c CHF in setting of Afib with RVR. Had 1-2 weeks of increased dyspnea, hypoxia and volume overload.  BNP 506. HS trop 12. CXR w/ stable cardiomegaly and increased bilateral perihilar and basilar opacities c/w PNA vs edema. Later tested positive for COVID-19. WBC 11.5K. AF. In Afib w/ RVR in 130s and initially on cardizem gtt. Underwent successful DCCV on 08/16. Maintained sinus rhythm for remainder of admission. EP consulted and did not recommend  AAD.  Will f/u with Afib clinic. Diuresed with 1 dose of IV lasix and then transitioned to 20 mg torsemide po daily. Down 9 lb during admit, renal function remained stable. TRH consulted for acute on chronic hypoxic respiratory failure and COVID-19 infection. Treated with dexamethasone, remdesivir, and baricitinib. Oxygen requirements improved during course but required 3L 02 day of discharge. Face to Face completed for home 02. Did have more pronounced hypoxia while asleep, may need sleep eval as outpatient. Blood sugars initially uncontrolled in setting of steroid use and holding long-acting insulin.  Diabetes coordinator and TRH assisted with management. Glucose control improving prior to discharge.   Discharging back home today with [place] at [employer]  PT evaluation recommended to monitor 02 sats with activity prior to progressing activity at residential gym.   Hospital Course by Problem: 1. Recurrent Atrial Fibrillation w/ RVR: Had successful DC-CV on 03/29/18 but then back in atrial fibrillation. Ranolazine added, but failed DCCV again 04/02/18. DCCV 04/19/18 was successful.  AF with RVR at 3/22 admission, amiodarone restarted and he was cardioverted to NSR, but he developed amiodarone lung toxicity.  Amiodarone was stopped.  He had subsequently gone back into rate-controlled atrial fibrillation. Underwent Afib Ablation 6/22. Now w/ recurrent PAF w/ RVR. Initially on cardizem gtt. - S/p DCCV on 08/16, maintaining sinus rhythm. EP consulted. No antiarrhythmic therapy recommended. Within 3 mo post ablation window. - Continue Eliquis 5 mg bid - Continue Coreg 12.5 mg bid - Continue ranolazine 500 mg bid  - No amiodarone with history of lung toxicity.   - Keep off digoxin - Keep K > 4.0 and Mg >2.0  - Consider sleep evaluation at f/u. ? Untreated sleep apnea. Hypoxia more pronounced during sleep hours. 2. Acute on Chronic systolic=>diastolic CHF: Prior ischemic cardiomyopathy, improved after PCI in 2017.  Echo 03/2018 with EF back down to 35-40% and moderate-severe RV dysfunction. This was in the setting of atrial fibrillation with RVR. EF 40% on TEE 04/2018.  Echo was in 3/20, showing EF up to 50%.  Suspect that he primarily had a tachycardia-mediated CMP at that point.  Echo in 7/21 with EF 25-30%, severe RV dysfunction.  TEE in 10/21, however, showed EF up to 50%.  He had PCI to LAD in 10/21. Echo in 3/22 showed stable EF 50-55% with normal RV.  TEE 6/22 at time of Afib ablation showed EF 50-55% w/ normal RV. Now w/ acute on chronic CHF in setting of recurrent afib w/ RVR. Bedside Echo showed  mildly reduced EF 40-45%. TTE 08/16 with EF 45-50%, GIIDD, RV okay, likely moderate low flow AS - Good diuresis with 20 mg Torsemide daily. Weight down 5 # overnight and 9 lb from admit. Volume status appears low. Last CVP 6. -3L last 24 hrs. Scr stable. BUN trending up. Hold Torsemide today. Will restart tomorrow. - Continue Entresto to 24/26 bid (dose recently reduced given improved EF and bump in SCr) - Continue Coreg 12.5 mg bid (did not tolerate 25 mg dose due to low BP and dizziness)  - Continue Jardiance 25 mg daily  3. CKD: Stage 3b.  SCr 1.57 on admit c/w baseline. Scr 1.44 today.  -Recheck labs at f/u 4. CAD: History of DES to RCA in 9/17.  Underwent LHC 04/03/18 with PCI to prox left circ. LHC in 10/21 with PCI to LAD.   - He is on Eliquis so no ASA.   - He is now off Plavix (finished 6 month therapy)  - Continue statin.  -  Stable. No CP. 5. DM2: on Jardiance  -SSI  -A1c 10 in June -Appreciate input from diabetes coordinator -Elevated blood glucose in setting of steroid use. Gradually improving during stay. 6. Aortic stenosis: Mild on 3/22 echo.  -Echo this admit with likely moderate low flow AS, DVI 0.24 and AVA 0.90 cm2. Mean gradient 14 mmhg 7. Mitral regurgitation: Mild on 3/22 echo.  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 He is off amiodarone.  He improved with steroids.   - Continue steroid taper per pulmonary, he is followed by pulmonary as outpatient - Bactrim for PJP prophylaxis  9. Acute on Chronic Hypoxic Respiratory Failure  - h/o amio toxicity, remains on steroid taper (prednisone 5 mg/day). Discussed with TRH. Will provider patient instructions to complete steroid taper at discharge (prednisone 20 mg X 2, 10 mg X 2, 5 mg X 2 then stop). Appreciate input. - hypoxia exacerbated by a/c CHF, COVID 19 infection + Afib w/ RVR - Diuresed well - TRH consulted. Appreciate input.  - Wean 02 as able. Still on 3L [place]. Needs 02 at discharge, TOC CM assisting. Given  underlying lung damage with amiodarone toxicity, may take a little longer before he recovers enough from COVID to come back off oxygen. - Procalcitonin < 0.10 10. COIVD 19 Infection  - CXR w/ ? PNA.  - TRH consulted as above - Received Remdesivir X 4, Baricitinib, Decadron per TRH - Ddimer 2.19, CRP 25.7 > 26.3 11. Leukoctyosis -WBC 11.5>6.5>15.6>12.4 -Chronically elevated last few months in setting of steroid use -AF    Discharge Weight Range: 253 - 244 lb Discharge Vitals: Blood pressure 118/73, pulse 62, temperature (!) 97.4 F (36.3 C), temperature source Oral, resp. rate 15, height $RemoveBe'6\' 2"'UVjxyDbkW$  (1.88 m), weight 110.7 kg, SpO2 93 %.  Labs: Lab Results  Component Value Date   WBC 12.4 (H) 12/31/2020   HGB 12.2 (L) 12/31/2020   HCT 38.0 (L) 12/31/2020   MCV 92.0 12/31/2020   PLT 528 (H) 12/31/2020    Recent Labs  Lab 12/31/20 0406  NA 138  K 4.2  CL 101  CO2 29  BUN 43*  CREATININE 1.44*  CALCIUM 8.7*  PROT 6.6  BILITOT 0.7  ALKPHOS 66  ALT 62*  AST 32  GLUCOSE 232*   Lab Results  Component Value Date   CHOL 344 (H) 11/08/2020   HDL 65.80 11/08/2020   LDLCALC 59 08/02/2020   TRIG 247.0 (H) 11/08/2020   BNP (last 3 results) Recent Labs    08/31/20 1547 09/28/20 1225 12/27/20 1409  BNP 124.8* 360.9* 502.6*    ProBNP (last 3 results) No results for input(s): PROBNP in the last 8760 hours.   Diagnostic Studies/Procedures   No results found.  Discharge Medications   Allergies as of 12/31/2020       Reactions   Amiodarone Shortness Of Breath        Medication List     TAKE these medications    acetaminophen 500 MG tablet Commonly known as: TYLENOL Take 1,000 mg by mouth every 6 (six) hours as needed for moderate pain or headache.   Advair Diskus 100-50 MCG/ACT Aepb Generic drug: fluticasone-salmeterol INHALE 1 PUFF INTO THE LUNGS TWICE DAILY   albuterol 108 (90 Base) MCG/ACT inhaler Commonly known as: VENTOLIN HFA Inhale 2 puffs into  the lungs every 6 (six) hours as needed for wheezing or shortness of breath.   carvedilol 25 MG tablet Commonly known as: COREG Take 12.5 mg by mouth 2 (two) times  daily with a meal.   cholecalciferol 1000 units tablet Commonly known as: VITAMIN D Take 1 tablet (1,000 Units total) by mouth daily.   Eliquis 5 MG Tabs tablet Generic drug: apixaban TAKE 1 TABLET(5 MG) BY MOUTH TWICE DAILY   empagliflozin 25 MG Tabs tablet Commonly known as: JARDIANCE Take 1 tablet (25 mg total) by mouth daily before breakfast.   ezetimibe 10 MG tablet Commonly known as: ZETIA Take 1 tablet (10 mg total) by mouth daily.   glucose blood test strip Use as instructed   icosapent Ethyl 1 g capsule Commonly known as: Vascepa Take 2 capsules (2 g total) by mouth 2 (two) times daily.   insulin lispro 100 UNIT/ML KwikPen Commonly known as: HumaLOG KwikPen Max daily 15 units   Lantus SoloStar 100 UNIT/ML Solostar Pen Generic drug: insulin glargine Inject 10 Units into the skin at bedtime.   metFORMIN 1000 MG tablet Commonly known as: GLUCOPHAGE Take 1 tablet (1,000 mg total) by mouth daily with breakfast.   nitroGLYCERIN 0.4 MG SL tablet Commonly known as: Nitrostat Place 1 tablet (0.4 mg total) under the tongue every 5 (five) minutes as needed for chest pain.   pantoprazole 40 MG tablet Commonly known as: Protonix Take 1 tablet (40 mg total) by mouth daily.   Pen Needles 32G X 4 MM Misc Inject 1 Device into the skin in the morning, at noon, in the evening, and at bedtime. Use 1 at bedtime with Lantus.  Dx code: E11.9   polyvinyl alcohol 1.4 % ophthalmic solution Commonly known as: LIQUIFILM TEARS Place 1 drop into both eyes daily as needed for dry eyes (irritation).   predniSONE 10 MG tablet Commonly known as: DELTASONE Take 2 tablets (20 mg total) by mouth daily with breakfast for 2 days, THEN 1 tablet (10 mg total) daily with breakfast for 2 days, THEN 0.5 tablets (5 mg total) daily  with breakfast for 2 days. Then stop after TAPER. Start taking on: January 01, 2021 What changed:  See the new instructions. Another medication with the same name was removed. Continue taking this medication, and follow the directions you see here.   ranolazine 500 MG 12 hr tablet Commonly known as: RANEXA Take 1 tablet (500 mg total) by mouth 2 (two) times daily.   rosuvastatin 40 MG tablet Commonly known as: CRESTOR Take 1 tablet (40 mg total) by mouth daily.   sacubitril-valsartan 24-26 MG Commonly known as: ENTRESTO Take 1 tablet by mouth 2 (two) times daily. What changed: Another medication with the same name was removed. Continue taking this medication, and follow the directions you see here.   sodium polystyrene 15 GM/60ML suspension Commonly known as: KAYEXALATE Take 60 mLs (15 g total) by mouth daily as needed.   torsemide 20 MG tablet Commonly known as: DEMADEX Take 1 tablet (20 mg total) by mouth in the morning. Start taking on 08/22/20               Durable Medical Equipment  (From admission, onward)           Start     Ordered   12/29/20 1624  For home use only DME oxygen  Once       Question Answer Comment  Length of Need Lifetime   Mode or (Route) Nasal cannula   Liters per Minute 3   Frequency Continuous (stationary and portable oxygen unit needed)   Oxygen delivery system Gas      12/29/20 1623   12/29/20 1620  Heart failure home health orders  (Heart failure home health orders / Face to face)  Once       Comments: Heart Failure Follow-up Care:  Verify follow-up appointments per Patient Discharge Instructions. Confirm transportation arranged. Reconcile home medications with discharge medication list. Remove discontinued medications from use. Assist patient/caregiver to manage medications using pill box. Reinforce low sodium food selection Assessments: Vital signs and oxygen saturation at each visit. Assess home environment for safety  concerns, caregiver support and availability of low-sodium foods. Consult Education officer, museum, PT/OT, Dietitian, and CNA based on assessments. Perform comprehensive cardiopulmonary assessment. Notify MD for any change in condition or weight gain of 3 pounds in one day or 5 pounds in one week with symptoms. Daily Weights and Symptom Monitoring: Ensure patient has access to scales. Teach patient/caregiver to weigh daily before breakfast and after voiding using same scale and record.    Teach patient/caregiver to track weight and symptoms and when to notify Provider. Home 02 - assess for ability to maintain 02 sats at rest and ambulation, wean off 02 as able Activity: Develop individualized activity plan with patient/caregiver.  Question Answer Comment  Heart Failure Follow-up Care Advanced Heart Failure (AHF) Clinic at [phone]   Obtain the following labs Basic Metabolic Panel   Lab frequency Weekly   Fax lab results to AHF Clinic at [phone]   Diet Low Sodium Heart Healthy   Fluid restrictions: 1500 mL Fluid   Skilled Nurse to notify MD of weight trends weekly for first 2 weeks. May fax or call: AHF Clinic at [phone] (fax) or [phone]      12/29/20 1623            Disposition   The patient will be discharged in stable condition to home. Discharge Instructions     Diet - low sodium heart healthy   Complete by: As directed    Heart Failure patients record your daily weight using the same scale at the same time of day   Complete by: As directed    Increase activity slowly   Complete by: As directed    STOP any activity that causes chest pain, shortness of breath, dizziness, sweating, or exessive weakness   Complete by: As directed        Follow-up Information     [facility] [place] Follow up.   Specialty: Cardiology Why: 01/10/21 @ 10:30AM with R. Fenton, PA-C Contact information: [address] [id number] [place]  [place]        Thompson Grayer, MD Follow up.   Specialty: Cardiology Why: 01/31/21 @ 2:45PM Contact information: [address] [place]  [address] [place] Follow up.   Why: [facility] Contact information: [address] [address] [phone]         [facility] Follow up on 01/10/2021.   Specialty: Cardiology Why: Advanced HF Clinic at [facility] at 9:30 am Entrance C, Garage Code 5544 Contact information: [address] [id number] [place] [phone]                  Duration of Discharge Encounter: Greater than 35 minutes   Signed, [facility], LINDSAY N  12/31/2020, 10:34 AM   Patient seen with PA, agree with the above note.   He remains euvolemic today, still in NSR.  On  3 L oxygen, will need to continue at home.  Hopefully will wean off oxygen over the next couple weeks.    Can go home on meds listed in progress note earlier today.   Loralie Champagne 12/31/2020 11:46 AM

## 2020-12-29 NOTE — Progress Notes (Signed)
SATURATION QUALIFICATIONS: (This note is used to comply with regulatory documentation for home oxygen)  Patient Saturations on Room Air at Rest = 85%  Patient Saturations on Room Air while Ambulating = did not complete due to pt needing 3L O2 at rest  Patient Saturations on 4 Liters of oxygen while Ambulating = 90%  Please briefly explain why patient needs home oxygen: pt unable to maintain SpO2 > 90% on RA.  Lazarus Gowda, PT, DPT   Acute Rehabilitation Department Pager #: [phone]

## 2020-12-29 NOTE — Plan of Care (Signed)
  Problem: Education: Goal: Knowledge of General Education information will improve Description: Including pain rating scale, medication(s)/side effects and non-pharmacologic comfort measures Outcome: Progressing   Problem: Health Behavior/Discharge Planning: Goal: Ability to manage health-related needs will improve Outcome: Progressing   Problem: Clinical Measurements: Goal: Ability to maintain clinical measurements within normal limits will improve Outcome: Progressing Goal: Will remain free from infection Outcome: Progressing Goal: Diagnostic test results will improve Outcome: Progressing Goal: Respiratory complications will improve Outcome: Progressing Goal: Cardiovascular complication will be avoided Outcome: Progressing   Problem: Activity: Goal: Risk for activity intolerance will decrease Outcome: Progressing   Problem: Nutrition: Goal: Adequate nutrition will be maintained Outcome: Progressing   Problem: Nutrition: Goal: Adequate nutrition will be maintained Outcome: Progressing   Problem: Coping: Goal: Level of anxiety will decrease Outcome: Progressing   Problem: Elimination: Goal: Will not experience complications related to bowel motility Outcome: Progressing Goal: Will not experience complications related to urinary retention Outcome: Progressing   Problem: Pain Managment: Goal: General experience of comfort will improve Outcome: Progressing   Problem: Safety: Goal: Ability to remain free from injury will improve Outcome: Progressing   Problem: Skin Integrity: Goal: Risk for impaired skin integrity will decrease Outcome: Progressing   Problem: Education: Goal: Knowledge of disease or condition will improve Outcome: Progressing Goal: Understanding of medication regimen will improve Outcome: Progressing Goal: Individualized Educational Video(s) Outcome: Progressing   Problem: Activity: Goal: Ability to tolerate increased activity will  improve Outcome: Progressing   Problem: Cardiac: Goal: Ability to achieve and maintain adequate cardiopulmonary perfusion will improve Outcome: Progressing   Problem: Health Behavior/Discharge Planning: Goal: Ability to safely manage health-related needs after discharge will improve Outcome: Progressing   Problem: Education: Goal: Ability to demonstrate management of disease process will improve Outcome: Progressing Goal: Ability to verbalize understanding of medication therapies will improve Outcome: Progressing Goal: Individualized Educational Video(s) Outcome: Progressing   Problem: Activity: Goal: Capacity to carry out activities will improve Outcome: Progressing   Problem: Cardiac: Goal: Ability to achieve and maintain adequate cardiopulmonary perfusion will improve Outcome: Progressing

## 2020-12-29 NOTE — Progress Notes (Addendum)
Advanced Heart Failure Rounding Note  PCP-Cardiologist: [name], MD   Patient Profile    80 y/o male w/ h/o CAD, difficult to control Afib w/ prior failed DCCVs and recent Afib ablation 6/22 w/ recurrence, h/o amiodarone toxicity, h/o systolic heart failure felt to be tachymediated w/ improved EF 25-30%>>50% and stage III CKD, admitted for a/c CHF in the setting of recurrent Afib w/ RVR.   In ED, found to be COVID +.  Bedside echo showed EF 40-45% with mild to moderate AS    Subjective:   S/p DCCV on 08/16. Maintaining sinus rhythm this am.  O2 requirements down overnight, now on HFNC at 5L/min. Admit CXR w/ increased bilateral perihilar and basilar opacities.   AF. WBC 11.5 > 6.0.   Responding well to IV Lasix. Only neg 700 cc last 24 hrs, > 2L fluid intake  CVP 5  SCr 1.57>>1.44 >> 1.55 K 4.4 Blood glucose 393    Objective:   Weight Range: 112.2 kg Body mass index is 31.76 kg/m.   Vital Signs:   Temp:  [97.2 F (36.2 C)-98.1 F (36.7 C)] 97.6 F (36.4 C) (08/17 0340) Pulse Rate:  [65-98] 65 (08/17 0340) Resp:  [12-23] 21 (08/17 0340) BP: (91-119)/(55-84) 112/69 (08/17 0340) SpO2:  [91 %-96 %] 91 % (08/17 0340) Weight:  [112 kg-112.2 kg] 112.2 kg (08/17 0340) Last BM Date: 12/27/20  Weight change: Filed Weights   12/28/20 0553 12/28/20 1221 12/29/20 0340  Weight: 112 kg 112 kg 112.2 kg    Intake/Output:   Intake/Output Summary (Last 24 hours) at 12/29/2020 0740 Last data filed at 12/29/2020 0634 Gross per 24 hour  Intake 2821.1 ml  Output 3525 ml  Net -703.9 ml      Physical Exam   CVP 5 General:  Well appearing but on HFNC HEENT: Normal Neck: Supple. JVP not well visualized . Carotids 2+ bilat; no bruits. No lymphadenopathy or thyromegaly appreciated. Cor: PMI nondisplaced. Irregularly irregular rhythm, tachy rate. No rubs, gallops or murmurs. Lungs: Clear Abdomen: Soft, nontender, nondistended. No hepatosplenomegaly. No bruits or  masses. Good bowel sounds. Extremities: No cyanosis, clubbing, rash, edema Neuro: Alert & orientedx3, cranial nerves grossly intact. moves all 4 extremities w/o difficulty. Affect pleasant   Telemetry   Sinus, 60s-70s  EKG    No new EKG to review   Labs    CBC Recent Labs    12/27/20 1336 12/29/20 0340  WBC 11.5* 6.5  NEUTROABS  --  5.3  HGB 12.2* 11.2*  HCT 38.8* 37.1*  MCV 93.9 95.6  PLT 506* 516*   Basic Metabolic Panel Recent Labs    69/48/54 1409 12/28/20 0232 12/29/20 0340  NA  --  139 135  K  --  3.5 4.4  CL  --  103 100  CO2  --  25 26  GLUCOSE  --  179* 393*  BUN  --  19 30*  CREATININE  --  1.44* 1.55*  CALCIUM  --  8.5* 8.2*  MG 2.3  --  2.5*  PHOS  --   --  5.3*   Liver Function Tests Recent Labs    12/28/20 1415 12/29/20 0340  AST 46* 90*  ALT 54* 83*  ALKPHOS 67 70  BILITOT 1.3* 1.0  PROT 6.7 6.6  ALBUMIN 2.2* 2.2*   No results for input(s): LIPASE, AMYLASE in the last 72 hours. Cardiac Enzymes No results for input(s): CKTOTAL, CKMB, CKMBINDEX, TROPONINI in the last 72 hours.  BNP:  BNP (last 3 results) Recent Labs    08/31/20 1547 09/28/20 1225 12/27/20 1409  BNP 124.8* 360.9* 502.6*    ProBNP (last 3 results) No results for input(s): PROBNP in the last 8760 hours.   D-Dimer Recent Labs    12/29/20 0340  DDIMER 2.19*   Hemoglobin A1C No results for input(s): HGBA1C in the last 72 hours. Fasting Lipid Panel No results for input(s): CHOL, HDL, LDLCALC, TRIG, CHOLHDL, LDLDIRECT in the last 72 hours. Thyroid Function Tests No results for input(s): TSH, T4TOTAL, T3FREE, THYROIDAB in the last 72 hours.  Invalid input(s): FREET3  Other results:   Imaging    DG CHEST PORT 1 VIEW  Result Date: 12/28/2020 CLINICAL DATA:  PICC line placement. EXAM: PORTABLE CHEST 1 VIEW COMPARISON:  Chest x-ray from yesterday. FINDINGS: New right upper extremity PICC line with tip in the distal SVC. Stable cardiomegaly. Unchanged  bilateral airspace disease. No pneumothorax. No acute osseous abnormality. IMPRESSION: 1. New right upper extremity PICC line with tip in the distal SVC. 2. Unchanged extensive bilateral airspace disease. Electronically Signed   By: Obie Dredge M.D.   On: 12/28/2020 11:29   ECHOCARDIOGRAM COMPLETE  Result Date: 12/28/2020    ECHOCARDIOGRAM REPORT   Patient Name:   David Huynh Date of Exam: 12/28/2020 Medical Rec #:  [id number]         Height:       74.0 in Accession #:    [id number]        Weight:       246.9 lb Date of Birth:  28-Nov-1940         BSA:          2.377 m Patient Age:    80 years          BP:           112/67 mmHg Patient Gender: M                 HR:           71 bpm. Exam Location:  Inpatient Procedure: 2D Echo, Color Doppler and Cardiac Doppler Indications:    CHF-Acute Systolic  History:        Patient has prior history of Echocardiogram examinations, most                 recent 10/26/2020. CHF, CAD, Aortic Valve Disease; Risk                 Factors:Hypertension, Diabetes and Dyslipidemia. CKD.  Sonographer:    Ross Ludwig RDCS (AE) Referring Phys: [id number] Roxy Horseman SIMMONS  Sonographer Comments: Suboptimal subcostal window. IMPRESSIONS  1. Inferior basal hypokinesis . Left ventricular ejection fraction, by estimation, is 45 to 50%. The left ventricle has mildly decreased function. The left ventricle has no regional wall motion abnormalities. There is moderate left ventricular hypertrophy. Left ventricular diastolic parameters are consistent with Grade II diastolic dysfunction (pseudonormalization). Elevated left ventricular end-diastolic pressure.  2. Right ventricular systolic function is normal. The right ventricular size is normal.  3. Left atrial size was moderately dilated.  4. Right atrial size was mildly dilated.  5. The mitral valve is abnormal. Mild mitral valve regurgitation. No evidence of mitral stenosis.  6. Likely moderate low flow AS with DVI and AVA low 0.24 and 0.90 cm2 .  The aortic valve is tricuspid. There is moderate calcification of the aortic valve. There is moderate thickening of the aortic valve. Aortic valve regurgitation is mild.  Moderate aortic valve stenosis.  7. Aortic dilatation noted. There is mild dilatation of the aortic root, measuring 38 mm.  8. The inferior vena cava is normal in size with greater than 50% respiratory variability, suggesting right atrial pressure of 3 mmHg. FINDINGS  Left Ventricle: Inferior basal hypokinesis. Left ventricular ejection fraction, by estimation, is 45 to 50%. The left ventricle has mildly decreased function. The left ventricle has no regional wall motion abnormalities. The left ventricular internal cavity size was normal in size. There is moderate left ventricular hypertrophy. Left ventricular diastolic parameters are consistent with Grade II diastolic dysfunction (pseudonormalization). Elevated left ventricular end-diastolic pressure. Right Ventricle: The right ventricular size is normal. No increase in right ventricular wall thickness. Right ventricular systolic function is normal. Left Atrium: Left atrial size was moderately dilated. Right Atrium: Right atrial size was mildly dilated. Pericardium: There is no evidence of pericardial effusion. Mitral Valve: The mitral valve is abnormal. There is moderate thickening of the mitral valve leaflet(s). There is moderate calcification of the mitral valve leaflet(s). Mild mitral annular calcification. Mild mitral valve regurgitation. No evidence of mitral valve stenosis. MV peak gradient, 6.9 mmHg. The mean mitral valve gradient is 2.0 mmHg. Tricuspid Valve: The tricuspid valve is normal in structure. Tricuspid valve regurgitation is not demonstrated. No evidence of tricuspid stenosis. Aortic Valve: Likely moderate low flow AS with DVI and AVA low 0.24 and 0.90 cm2. The aortic valve is tricuspid. There is moderate calcification of the aortic valve. There is moderate thickening of the  aortic valve. Aortic valve regurgitation is mild. Moderate aortic stenosis is present. Aortic valve mean gradient measures 14.3 mmHg. Aortic valve peak gradient measures 26.1 mmHg. Aortic valve area, by VTI measures 0.90 cm. Pulmonic Valve: The pulmonic valve was normal in structure. Pulmonic valve regurgitation is not visualized. No evidence of pulmonic stenosis. Aorta: The aortic root is normal in size and structure and aortic dilatation noted. There is mild dilatation of the aortic root, measuring 38 mm. Venous: The inferior vena cava is normal in size with greater than 50% respiratory variability, suggesting right atrial pressure of 3 mmHg. IAS/Shunts: The interatrial septum was not well visualized.  LEFT VENTRICLE PLAX 2D LVIDd:         4.70 cm  Diastology LVIDs:         3.30 cm  LV e' medial:    6.53 cm/s LV PW:         1.50 cm  LV E/e' medial:  20.5 LV IVS:        1.60 cm  LV e' lateral:   5.19 cm/s LVOT diam:     2.20 cm  LV E/e' lateral: 25.8 LV SV:         49 LV SV Index:   21 LVOT Area:     3.80 cm  RIGHT VENTRICLE RV Basal diam:  4.10 cm RV S prime:     11.70 cm/s TAPSE (M-mode): 2.0 cm LEFT ATRIUM             Index       RIGHT ATRIUM           Index LA diam:        4.30 cm 1.81 cm/m  RA Area:     34.40 cm LA Vol (A2C):   78.6 ml 33.07 ml/m RA Volume:   124.00 ml 52.16 ml/m LA Vol (A4C):   76.4 ml 32.14 ml/m LA Biplane Vol: 81.6 ml 34.33 ml/m  AORTIC VALVE AV Area (  Vmax):    0.96 cm AV Area (Vmean):   0.90 cm AV Area (VTI):     0.90 cm AV Vmax:           255.67 cm/s AV Vmean:          179.000 cm/s AV VTI:            0.547 m AV Peak Grad:      26.1 mmHg AV Mean Grad:      14.3 mmHg LVOT Vmax:         64.70 cm/s LVOT Vmean:        42.300 cm/s LVOT VTI:          0.129 m LVOT/AV VTI ratio: 0.24  AORTA Ao Root diam: 3.80 cm Ao Asc diam:  3.70 cm MITRAL VALVE                 TRICUSPID VALVE MV Area (PHT): 3.66 cm      TR Peak grad:   26.0 mmHg MV Area VTI:   1.50 cm      TR Vmax:        255.00  cm/s MV Peak grad:  6.9 mmHg MV Mean grad:  2.0 mmHg      SHUNTS MV Vmax:       1.31 m/s      Systemic VTI:  0.13 m MV Vmean:      69.4 cm/s     Systemic Diam: 2.20 cm MV Decel Time: 207 msec MR Peak grad:    100.8 mmHg MR Mean grad:    69.0 mmHg MR Vmax:         502.00 cm/s MR Vmean:        396.0 cm/s MR PISA:         1.01 cm MR PISA Eff ROA: 6 mm MR PISA Radius:  0.40 cm MV E velocity: 134.00 cm/s MV A velocity: 30.50 cm/s MV E/A ratio:  4.39 Charlton Haws MD Electronically signed by Charlton Haws MD Signature Date/Time: 12/28/2020/3:39:54 PM    Final    [place] EKG SITE RITE  Result Date: 12/28/2020 If Site Rite image not attached, placement could not be confirmed due to current cardiac rhythm.    Medications:     Scheduled Medications:  apixaban  5 mg Oral BID   vitamin C  500 mg Oral Daily   baricitinib  2 mg Oral Daily   carvedilol  12.5 mg Oral BID WC   Chlorhexidine Gluconate Cloth  6 each Topical Daily   dexamethasone (DECADRON) injection  6 mg Intravenous Q24H   digoxin  0.125 mg Oral Daily   empagliflozin  25 mg Oral QAC breakfast   ezetimibe  10 mg Oral Daily   furosemide  80 mg Intravenous BID   icosapent Ethyl  2 g Oral BID   insulin aspart  0-20 Units Subcutaneous TID WC   pantoprazole  40 mg Oral Daily   ranolazine  500 mg Oral BID   rosuvastatin  40 mg Oral Daily   sacubitril-valsartan  1 tablet Oral BID   sodium chloride flush  10-40 mL Intracatheter Q12H   sodium chloride flush  3 mL Intravenous Q12H   zinc sulfate  220 mg Oral Daily    Infusions:  sodium chloride     remdesivir 100 mg in NS 100 mL      PRN Medications: sodium chloride, acetaminophen, chlorpheniramine-HYDROcodone, guaiFENesin-dextromethorphan, Ipratropium-Albuterol, nitroGLYCERIN, sodium chloride flush, sodium chloride flush    Assessment/Plan  1. Recurrent Atrial Fibrillation w/ RVR: Had successful DC-CV on 03/29/18 but then back in atrial fibrillation. Ranolazine added, but failed DCCV  again 04/02/18. DCCV 04/19/18 was successful.  AF with RVR at 3/22 admission, amiodarone restarted and he was cardioverted to NSR, but he developed amiodarone lung toxicity.  Amiodarone was stopped.  He had subsequently gone back into rate-controlled atrial fibrillation. Underwent Afib Ablation 6/22. Now w/ recurrent PAF w/ RVR.  - S/p DCCV on 08/16, maintaining sinus rhythm. EP consulted. No antiarrhythmic therapy recommended. Within 3 mo post ablation window. - Continue Eliquis 5 mg bid - Continue Coreg 12.5 mg bid - Continue ranolazine 500 mg bid  - No amiodarone with history of lung toxicity.   - Stop digoxin - Keep K > 4.0 and Mg >2.0  2. Acute on Chronic systolic=>diastolic CHF: Prior ischemic cardiomyopathy, improved after PCI in 2017.  Echo 03/2018 with EF back down to 35-40% and moderate-severe RV dysfunction. This was in the setting of atrial fibrillation with RVR. EF 40% on TEE 04/2018.  Echo was in 3/20, showing EF up to 50%.  Suspect that he primarily had a tachycardia-mediated CMP at that point.  Echo in 7/21 with EF 25-30%, severe RV dysfunction.  TEE in 10/21, however, showed EF up to 50%.  He had PCI to LAD in 10/21. Echo in 3/22 showed stable EF 50-55% with normal RV.  TEE 6/22 at time of Afib ablation showed stable EF 50-55% w/ normal RV. Now w/ acute on chronic CHF in setting of recurrent afib w/ RVR. Bedside Echo showed mildly reduced EF now 40-45%. TTE 08/16 with EF 45-50%, GIIDD, RV okay, likely moderate low flow AS - CVP 5. Diuresed well with IV lasix. Will stop Iv diuretic. Add back torsemide 20 mg po daily (home dose). - Continue Entresto to 24/26 bid (dose recently reduced given improved EF and bump in SCr) - Continue Coreg 12.5 mg bid (did not tolerate 25 mg dose due to low BP and dizziness)  - Continue Jardiance 25 mg daily  - Continue digoxin 0.125 mg daily  3. CKD: Stage 3.  SCr 1.57 on admit c/w baseline. Scr 1.55 today - follow BMP w/ diuresis  4. CAD: History of DES  to RCA in 9/17.  Underwent LHC 04/03/18 with PCI to prox left circ. LHC in 10/21 with PCI to LAD.   - He is on Eliquis so no ASA.   - He is now off Plavix (finished 6 month therapy)  - Continue statin.  5. DM2: on Jardiance  6. Aortic stenosis: Mild on 3/22 echo.  -Echo this admit with likely moderate low flow AS, DVI 0.24 and AVA 0.90 cm2. Mean gradient 14 mmhg 7. Mitral regurgitation: Mild on 3/22 echo.  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 He is off amiodarone.  He improved with steroids.   - Continue steroid taper per pulmonary, he is followed with pulmonary.  - Bactrim for PJP prophylaxis  9. Acute on Chronic Hypoxic Respiratory Failure  - h/o amio toxicity, remains on steroid taper (prednisone 5 mg/day).  - hypoxia exacerbated by a/c CHF, COVID 19 infection + Afib w/ RVR - c/w diuresis - [facility] consulted. Appreciate input.  - wean HFNC as tolerated  -Awaiting sputum culture - Procalcitonin < 0.10 10. COIVD 19 Infection  - CXR w/ ? PNA.  - TR[facility] consulted as above - Remdesivir, Baricitinib, Decadron, Vitamin C/Zinc per TR[facility] - Ddimer 2.19, CRP 25.7 > 26.3 11. DM, type II -A1c 10 in June -  On steroids -Hyperglycemia with blood glucose 393 this am -SSI  Can likely discharge home tomorrow. Will attempt to wean 02 requirements, has 02 at home if needed. Will discuss remdesivir with hospitalist team.   Length of Stay: 2  FINCH, LINDSAY N, PA-C  12/29/2020, 7:40 AM  Advanced Heart Failure Team Pager [phone] (M-F; 7a - 5p)  Please contact [facility] for night-coverage after hours (5p -7a ) and weekends on [email or web address]  Patient seen with PA, agree with the above note.   General: NAD Neck: No JVD, no thyromegaly or thyroid nodule.  Lungs: Crackles at bases CV: Nondisplaced PMI.  Heart regular S1/S2, no S3/S4, 3/6 SEM RUSB with clear S2.  No peripheral edema.   Abdomen: Soft, nontender, no hepatosplenomegaly, no distention.  Skin: Intact without lesions or rashes.   Neurologic: Alert and oriented x 3.  Psych: Normal affect. Extremities: No clubbing or cyanosis.  HEENT: Normal.   Patient was admitted with atrial fibrillation/RVR + CHF exacerbation in setting of COVID-19 infection.  He was cardioverted back to NSR on 8/16 and remains in NSR today.  Continue ranolazine, not amiodarone candidate due to toxicity.  Has had afib ablation.  Possible Tikosyn candidate in future if continues to have issues with AF.   Echo done, EF 45-50% with basal inferior hypokinesis, normal RV, moderate AS/mild AI, IVC normal.  Today, he looks euvolemic.  Breathing improved.  Creatinine mildly higher at 1.55.  - Transition to home torsemide 20 mg daily.  - Continue Coreg, empagliflozin, and Entresto at current doses.     COVID-19 infection: Suspect this is driving current oxygen requirement rather than CHF.  We have titrated oxygen down to 3L HFNC.   - Continue oxygen titration.  - He is on remdesivir, baricitinib, Decadron per [facility].     H/o amiodarone lung toxicity, he has been down to prednisone 5 mg daily (taper per outpatient pulmonary), will need to eventually restart this (currently on Decadron for COVID tx).   I suspect that we can have him ready to go home tomorrow.  He actually already has oxygen at home to use if needed.   [name] 12/29/2020 8:33 AM

## 2020-12-29 NOTE — TOC CM/SW Note (Signed)
SATURATION QUALIFICATIONS: (This note is used to comply with regulatory documentation for home oxygen)  Patient Saturations on Room Air at Rest = 85%  Patient Saturations on Room Air while Ambulating =   Patient Saturations on 3 Liters of oxygen while Ambulating = 92%  Please briefly explain why patient needs home oxygen: CHF Please see note for pt's Unit RN April Excell Seltzer

## 2020-12-29 NOTE — Progress Notes (Addendum)
Progress Note  Patient Name: David Huynh Date of Encounter: 12/29/2020  [facility] Cardiologist: [name], MD   Subjective   Feels well, feels like he is thinking clearer  Inpatient Medications    Scheduled Meds:  apixaban  5 mg Oral BID   vitamin C  500 mg Oral Daily   baricitinib  2 mg Oral Daily   carvedilol  12.5 mg Oral BID WC   Chlorhexidine Gluconate Cloth  6 each Topical Daily   dexamethasone (DECADRON) injection  6 mg Intravenous Q24H   empagliflozin  25 mg Oral QAC breakfast   ezetimibe  10 mg Oral Daily   icosapent Ethyl  2 g Oral BID   insulin aspart  0-20 Units Subcutaneous TID WC   pantoprazole  40 mg Oral Daily   ranolazine  500 mg Oral BID   rosuvastatin  40 mg Oral Daily   sacubitril-valsartan  1 tablet Oral BID   sodium chloride flush  10-40 mL Intracatheter Q12H   sodium chloride flush  3 mL Intravenous Q12H   torsemide  20 mg Oral Daily   zinc sulfate  220 mg Oral Daily   Continuous Infusions:  sodium chloride     remdesivir 100 mg in NS 100 mL     PRN Meds: sodium chloride, acetaminophen, chlorpheniramine-HYDROcodone, guaiFENesin-dextromethorphan, Ipratropium-Albuterol, nitroGLYCERIN, sodium chloride flush, sodium chloride flush   Vital Signs    Vitals:   12/28/20 2008 12/28/20 2348 12/29/20 0340 12/29/20 0830  BP: 119/84 117/70 112/69   Pulse: 76 72 65 72  Resp: (!) 22 (!) 21 (!) 21 20  Temp: 97.7 F (36.5 C) 97.6 F (36.4 C) 97.6 F (36.4 C)   TempSrc: Axillary Oral Oral   SpO2: 95% 95% 91% 95%  Weight:   112.2 kg   Height:        Intake/Output Summary (Last 24 hours) at 12/29/2020 0902 Last data filed at 12/29/2020 4944 Gross per 24 hour  Intake 2764 ml  Output 3425 ml  Net -661 ml   Last 3 Weights 12/29/2020 12/28/2020 12/28/2020  Weight (lbs) 247 lb 5.7 oz 246 lb 14.6 oz 246 lb 14.6 oz  Weight (kg) 112.2 kg 112 kg 112 kg      Telemetry    SR 60's, infrequent PVCs - Personally Reviewed  ECG    No new EKGs  - Personally Reviewed  Physical Exam   Spoke to the patient on the phone, Dr. Shirlee Latch was in the room as well.  Pt speking in full sentences at a normal pace and without difficulty Quite pleasant and does not sound SOB or in any disctress  Labs    High Sensitivity Troponin:   Recent Labs  Lab 12/27/20 1336 12/27/20 1941  TROPONINIHS 12 13      Chemistry Recent Labs  Lab 12/27/20 1336 12/28/20 0232 12/28/20 1415 12/29/20 0340  NA 135 139  --  135  K 4.0 3.5  --  4.4  CL 102 103  --  100  CO2 22 25  --  26  GLUCOSE 263* 179*  --  393*  BUN 22 19  --  30*  CREATININE 1.57* 1.44*  --  1.55*  CALCIUM 8.5* 8.5*  --  8.2*  PROT  --   --  6.7 6.6  ALBUMIN  --   --  2.2* 2.2*  AST  --   --  46* 90*  ALT  --   --  54* 83*  ALKPHOS  --   --  67 70  BILITOT  --   --  1.3* 1.0  GFRNONAA 45* 49*  --  45*  ANIONGAP 11 11  --  9     Hematology Recent Labs  Lab 12/27/20 1336 12/29/20 0340  WBC 11.5* 6.5  RBC 4.13* 3.88*  HGB 12.2* 11.2*  HCT 38.8* 37.1*  MCV 93.9 95.6  MCH 29.5 28.9  MCHC 31.4 30.2  RDW 14.1 14.0  PLT 506* 516*    BNP Recent Labs  Lab 12/27/20 1409  BNP 502.6*     DDimer  Recent Labs  Lab 12/29/20 0340  DDIMER 2.19*     Radiology    DG CHEST PORT 1 VIEW  Result Date: 12/28/2020 CLINICAL DATA:  PICC line placement. EXAM: PORTABLE CHEST 1 VIEW COMPARISON:  Chest x-ray from yesterday. FINDINGS: New right upper extremity PICC line with tip in the distal SVC. Stable cardiomegaly. Unchanged bilateral airspace disease. No pneumothorax. No acute osseous abnormality. IMPRESSION: 1. New right upper extremity PICC line with tip in the distal SVC. 2. Unchanged extensive bilateral airspace disease. Electronically Signed   By: Obie Dredge M.D.   On: 12/28/2020 11:29   DG Chest Portable 1 View  Result Date: 12/27/2020 CLINICAL DATA:  Dyspnea with exertion. EXAM: PORTABLE CHEST 1 VIEW COMPARISON:  November 24, 2020. FINDINGS: Stable cardiomegaly. No  pneumothorax is noted. Increased bilateral perihilar and basilar opacities are noted concerning for pneumonia or possibly edema. Bony thorax is unremarkable. IMPRESSION: Increased bilateral lung opacities as described above. Electronically Signed   By: Lupita Raider M.D.   On: 12/27/2020 14:00      Cardiac Studies   12/28/20: TTE IMPRESSIONS   1. Inferior basal hypokinesis . Left ventricular ejection fraction, by  estimation, is 45 to 50%. The left ventricle has mildly decreased  function. The left ventricle has no regional wall motion abnormalities.  There is moderate left ventricular  hypertrophy. Left ventricular diastolic parameters are consistent with  Grade II diastolic dysfunction (pseudonormalization). Elevated left  ventricular end-diastolic pressure.   2. Right ventricular systolic function is normal. The right ventricular  size is normal.   3. Left atrial size was moderately dilated.   4. Right atrial size was mildly dilated.   5. The mitral valve is abnormal. Mild mitral valve regurgitation. No  evidence of mitral stenosis.   6. Likely moderate low flow AS with DVI and AVA low 0.24 and 0.90 cm2 .  The aortic valve is tricuspid. There is moderate calcification of the  aortic valve. There is moderate thickening of the aortic valve. Aortic  valve regurgitation is mild. Moderate  aortic valve stenosis.   7. Aortic dilatation noted. There is mild dilatation of the aortic root,  measuring 38 mm.   8. The inferior vena cava is normal in size with greater than 50%  respiratory variability, suggesting right atrial pressure of 3 mmHg.    10/26/20 EPS/ablation CONCLUSIONS: 1. Atrial fibrillation upon presentation.   2. Intracardiac echo reveals a moderate to large sized left atrium. 3. Successful electrical isolation and anatomical encircling of all four pulmonary veins with radiofrequency current. 4. Additional mapping and ablation within the left atrium due to persistence of  atrial fibrillation with a posterior wall box demonstrated  5. Atrial fibrillation successfully cardioverted to sinus rhythm. 6. Cavo-tricuspid isthmus ablation performed with complete bidirectional isthmus block achieved 7. No inducible arrhythmias following ablation 8. No early apparent complications.     10/26/20: TEE IMPRESSIONS   1. Left ventricular ejection  fraction, by estimation, is 40 to 45%. The  left ventricle has mildly decreased function.   2. Right ventricular systolic function is normal. The right ventricular  size is normal.   3. Left atrial size was moderately dilated. No left atrial/left atrial  appendage thrombus was detected.   4. Right atrial size was moderately dilated.   5. There is no reversal of flow in the pulmonary vein. . The mitral valve  is grossly normal. Mild to moderate mitral valve regurgitation.   6. The aortic valve is calcified. There is mild thickening of the aortic  valve. Aortic valve regurgitation is mild. Mild aortic valve stenosis.   Patient Profile     80 y.o. male with a hx of  CAD, chronic systolic CHF, HTN, HLD, DM, CKD 3, and asthma, amiodarone (pulm) toxicity admitted with a few weeks of AFib and progressive SOB/weakness  Found in AFib w/RVR, volume OL and COVID +  O2 is being weaned Getting treated for COVID  Assessment & Plan    Persistent AFib CHA2DS2Vasc is 6, on Eliquis S/p PVI ablation on 10/26/20 Hx of amiodarone toxoicity   Pt reported initially in/out of AFib post ablation though then settled back into AFib the last couple weeks S/p DCCV yesterday and maintaining SR No AAD at this juncture AFib clinic f/u is arranged (after his post covid isolation period is completed) He will see Dr. Johney Frame next month   For questions or updates, please contact [facility] Please consult [email or web address] for contact info under        Signed, Sheilah Pigeon, PA-C  12/29/2020, 9:02 AM     I have spoken with patient and  reviewed the above assessment and plan.  Changes to above are made where necessary.   He is clinically much improved in sinus rhythm. Continue current medical therapy for afib.  He has scheduled follow-up in the AF clinic.  Electrophysiology team to see as needed while here. Please call with questions.   Co Sign: Hillis Range, MD 12/29/2020 9:15 AM

## 2020-12-29 NOTE — TOC Progression Note (Signed)
Transition of Care (TOC) - Progression Note  Heart Failure   Patient Details  Name: David Huynh MRN: [id number] Date of Birth: Apr 20, 1941  Transition of Care [facility]) CM/SW Contact  Jerrod Damiano, LCSWA Phone Number: 12/29/2020, 10:05 AM  Clinical Narrative:    10:03 am - CSW received a call from [facility] regarding [name] and that if he will return home they can provide him with PT, however if he needs nursing they work with [facility] who can provide both PT and RN [facility] services.  CSW will continue to follow throughout discharge.   Expected Discharge Plan: Home w Home Health Services Barriers to Discharge: Continued Medical Work up  Expected Discharge Plan and Services Expected Discharge Plan: Home w Home Health Services In-house Referral: Clinical Social Work Discharge Planning Services: CM Consult Post Acute Care Choice: Home Health Living arrangements for the past 2 months: Single Family Home                                       Social Determinants of Health (SDOH) Interventions    Readmission Risk Interventions Readmission Risk Prevention Plan 08/20/2020  Transportation Screening Complete  PCP or Specialist Appt within 3-5 Days Complete  HRI or Home Care Consult Complete  Social Work Consult for Recovery Care Planning/Counseling Complete  Palliative Care Screening Not Applicable  Medication Review [employer]) Complete  Some recent data might be hidden   Arcelia Pals, MSW, LCSWA [phone] Heart Failure Social Worker

## 2020-12-30 DIAGNOSIS — I5043 Acute on chronic combined systolic (congestive) and diastolic (congestive) heart failure: Secondary | ICD-10-CM | POA: Diagnosis not present

## 2020-12-30 DIAGNOSIS — U071 COVID-19: Secondary | ICD-10-CM | POA: Diagnosis not present

## 2020-12-30 LAB — CBC WITH DIFFERENTIAL/PLATELET
Abs Immature Granulocytes: 0.15 10*3/uL — ABNORMAL HIGH (ref 0.00–0.07)
Basophils Absolute: 0 10*3/uL (ref 0.0–0.1)
Basophils Relative: 0 %
Eosinophils Absolute: 0 10*3/uL (ref 0.0–0.5)
Eosinophils Relative: 0 %
HCT: 38.3 % — ABNORMAL LOW (ref 39.0–52.0)
Hemoglobin: 11.8 g/dL — ABNORMAL LOW (ref 13.0–17.0)
Immature Granulocytes: 1 %
Lymphocytes Relative: 8 %
Lymphs Abs: 1.3 10*3/uL (ref 0.7–4.0)
MCH: 28.8 pg (ref 26.0–34.0)
MCHC: 30.8 g/dL (ref 30.0–36.0)
MCV: 93.4 fL (ref 80.0–100.0)
Monocytes Absolute: 0.6 10*3/uL (ref 0.1–1.0)
Monocytes Relative: 4 %
Neutro Abs: 13.5 10*3/uL — ABNORMAL HIGH (ref 1.7–7.7)
Neutrophils Relative %: 87 %
Platelets: 553 10*3/uL — ABNORMAL HIGH (ref 150–400)
RBC: 4.1 MIL/uL — ABNORMAL LOW (ref 4.22–5.81)
RDW: 13.8 % (ref 11.5–15.5)
WBC: 15.6 10*3/uL — ABNORMAL HIGH (ref 4.0–10.5)
nRBC: 0 % (ref 0.0–0.2)

## 2020-12-30 LAB — D-DIMER, QUANTITATIVE: D-Dimer, Quant: 1.71 ug/mL-FEU — ABNORMAL HIGH (ref 0.00–0.50)

## 2020-12-30 LAB — GLUCOSE, CAPILLARY
Glucose-Capillary: 338 mg/dL — ABNORMAL HIGH (ref 70–99)
Glucose-Capillary: 297 mg/dL — ABNORMAL HIGH (ref 70–99)
Glucose-Capillary: 157 mg/dL — ABNORMAL HIGH (ref 70–99)
Glucose-Capillary: 158 mg/dL — ABNORMAL HIGH (ref 70–99)

## 2020-12-30 LAB — COMPREHENSIVE METABOLIC PANEL
ALT: 69 U/L — ABNORMAL HIGH (ref 0–44)
AST: 42 U/L — ABNORMAL HIGH (ref 15–41)
Albumin: 2.2 g/dL — ABNORMAL LOW (ref 3.5–5.0)
Alkaline Phosphatase: 67 U/L (ref 38–126)
Anion gap: 14 (ref 5–15)
BUN: 35 mg/dL — ABNORMAL HIGH (ref 8–23)
CO2: 26 mmol/L (ref 22–32)
Calcium: 9.2 mg/dL (ref 8.9–10.3)
Chloride: 97 mmol/L — ABNORMAL LOW (ref 98–111)
Creatinine, Ser: 1.6 mg/dL — ABNORMAL HIGH (ref 0.61–1.24)
GFR, Estimated: 44 mL/min — ABNORMAL LOW (ref >60)
Glucose, Bld: 256 mg/dL — ABNORMAL HIGH (ref 70–99)
Potassium: 4 mmol/L (ref 3.5–5.1)
Sodium: 137 mmol/L (ref 135–145)
Total Bilirubin: 0.5 mg/dL (ref 0.3–1.2)
Total Protein: 6.8 g/dL (ref 6.5–8.1)

## 2020-12-30 LAB — FERRITIN: Ferritin: 335 ng/mL (ref 24–336)

## 2020-12-30 LAB — C-REACTIVE PROTEIN: CRP: 17.1 mg/dL — ABNORMAL HIGH (ref <1.0)

## 2020-12-30 LAB — MAGNESIUM: Magnesium: 2.7 mg/dL — ABNORMAL HIGH (ref 1.7–2.4)

## 2020-12-30 LAB — PHOSPHORUS: Phosphorus: 4.5 mg/dL (ref 2.5–4.6)

## 2020-12-30 MED ORDER — LORATADINE 10 MG PO TABS
10.0000 mg | ORAL_TABLET | Freq: Every day | ORAL | Status: DC
  Administered 2020-12-30 – 2020-12-31 (×2): 10 mg via ORAL
  Filled 2020-12-30 (×2): qty 1

## 2020-12-30 MED ORDER — PREDNISONE 10 MG PO TABS: 10.0000 mg | ORAL_TABLET | Freq: Every day | ORAL | Status: DC

## 2020-12-30 MED ORDER — PREDNISONE 20 MG PO TABS
20.0000 mg | ORAL_TABLET | Freq: Every day | ORAL | Status: AC
  Administered 2020-12-30 – 2020-12-31 (×2): 20 mg via ORAL
  Filled 2020-12-30 (×2): qty 1

## 2020-12-30 MED ORDER — PREDNISONE 10 MG PO TABS: 5.0000 mg | ORAL_TABLET | Freq: Every day | ORAL | Status: DC

## 2020-12-30 NOTE — Plan of Care (Signed)

## 2020-12-30 NOTE — Progress Notes (Signed)
VAST consulted to remove PICC. Upon assessment of chart and patient, noted that patient continues to receive IV meds and is planned to discharge tomorrow. VAST RN spoke with Dr. Shirlee Latch regarding leaving PICC in place until patient's discharge tomorrow so he does not have to be stuck again; Dr. Shirlee Latch verbalized permission to leave PICC in place.

## 2020-12-30 NOTE — Progress Notes (Signed)
PROGRESS NOTE    David Huynh  WGN:[id number] DOB: [birth year] DOA: 12/27/2020 PCP: Bradd Canary, MD    Brief Narrative:  80 y.o. male with medical history significant of hypertension, hyperlipidemia, systolic CHF, atrial fibrillation, CAD, CKD stage III, and amiodarone lung toxicity who presents with progressive shortness of breath starting 2-3 days ago.  Notes associated symptoms of a nonproductive cough.  Patient had underwent ablation with Dr. Johney Frame in June for atrial fibrillation, but reports that he goes in and out of atrial fibrillation still.  Symptoms were thought to be secondary to a CHF exacerbation.  He reported last testing negative for COVID-19 with that home test on 8/13.  Labs significant for BNP of 506.  Chest x-ray noted cardiomegaly with increased bilateral perihilar basilar opacities.  COVID-19 screening was noted to be positive.  He has been vaccinated and boosted twice against COVID-19.  Patient was started on IV diuretics with good diuresis, but patient requiring high flow nasal cannula oxygen. Patient admitted with heart failure team.  TRH consulted for COVID-19 infection.  Assessment & Plan:   Active Problems:   Acute on chronic combined systolic and diastolic CHF (congestive heart failure) (HCC)  Acute on chronic combined heart failure: Clinically improving as per cardiology. Followed by heart failure team.  Changed to oral torsemide today.   Pneumonia due to COVID-19 infection with hypoxemia: His hypoxemia is multifactorial.  He also has baseline amiodarone toxicity and currently on maintenance prednisone.  Exacerbated by COVID-19 infection and congestive heart failure. Clinically stabilizing. chest physiotherapy, incentive spirometry, deep breathing exercises, sputum induction, mucolytic's and bronchodilators. Supplemental oxygen to keep saturations more than 90%. Covid directed therapy with , steroids, on dexamethasone.  Patient was on tapering dose of  prednisone since last 4 months, will prescribe tapering prednisone 20 mg x 2, 10 mg x 2, 5 mg x 2 and stop. remdesivir, day 3/4 Baricitinib, day 3.  We will continue until clinical improvement or discharge. antibiotics, not indicated. Patient much clinically improved today.  Given his extensive medical comorbidities, will advise 1 more day of monitoring and complete fourth dose of remdesivir tomorrow.  SpO2: 94 % O2 Flow Rate (L/min): 3 L/min  COVID-19 Labs  Recent Labs    12/28/20 1415 12/29/20 0340 12/30/20 0441  DDIMER  --  2.19* 1.71*  FERRITIN  --  232 335  CRP 25.7* 26.3* 17.1*    Lab Results  Component Value Date   SARSCOV2NAA POSITIVE (A) 12/27/2020   SARSCOV2NAA NEGATIVE 08/09/2020   SARSCOV2NAA NEGATIVE 02/28/2020   SARSCOV2NAA NEGATIVE 12/06/2019     Paroxysmal atrial fibrillation: Status post cardioversion 8/16.  Sinus rhythm now.  Coronary artery disease: Stable.  Type 2 diabetes: Blood sugars uncontrolled due to ongoing use of steroids and also holding on his long-acting insulin. Started on  Levemir 15 units, start NovoLog 4 units with meals.  Keep on aggressive sliding scale insulin.   Start tapering off steroids today, hopefully can go back on his home insulin regimen tomorrow.  CKD stage IIIa: Stable at about baseline.  Thank you for involving [place] in this patient's care.  We will continue to manage along with you in regards to his COVID-19 pneumonia and diabetes.  Case discussed with patient and advanced heart failure team.  Given his multiple comorbidities, we should continue at least 1 more day of COVID directed therapies and monitor him in the hospital. He does have amiodarone induced lung toxicity and may become oxygen dependent for some time.  Inflammatory  markers are trending down. If adequate improvement, should be able to go home tomorrow with home oxygen. Will do tapering dose of prednisone for the next 6 days.   DVT prophylaxis:  apixaban  (ELIQUIS) tablet 5 mg   Code Status: Full code Family Communication: None Disposition Plan: Status is: Inpatient  Remains inpatient appropriate because:IV treatments appropriate due to intensity of illness or inability to take PO and Inpatient level of care appropriate due to severity of illness  Dispo: The patient is from: Home              Anticipated d/c is to: Home              Patient currently is not medically stable to d/c.   Difficult to place patient No         Consultants:  Cardiology  Procedures:  None  Antimicrobials:  Remdesivir 8/16----   Subjective: Patient seen and examined.  On 2 to 3 L of oxygen.  Eager to go home.  Sinus rhythm.  Objective: Vitals:   12/29/20 2310 12/30/20 0400 12/30/20 0458 12/30/20 0801  BP: 129/73 109/68  131/80  Pulse: 70 62  72  Resp: 15 14  16   Temp: 97.6 F (36.4 C) (!) 97.5 F (36.4 C)  (!) 97.5 F (36.4 C)  TempSrc: Oral Oral  Oral  SpO2: 95% 95%  94%  Weight:   113.1 kg   Height:        Intake/Output Summary (Last 24 hours) at 12/30/2020 1150 Last data filed at 12/30/2020 0900 Gross per 24 hour  Intake 1070.39 ml  Output 1925 ml  Net -854.61 ml   Filed Weights   12/28/20 1221 12/29/20 0340 12/30/20 0458  Weight: 112 kg 112.2 kg 113.1 kg    Examination:  General exam: Appears calm and comfortable , not in any distress. Respiratory system: Bilateral clear.  No added sounds.  On 2 liters of oxygen. Looks comfortable and talks in complete sentences. Cardiovascular system: S1 & S2 heard, RRR.  Gastrointestinal system: Abdomen is nondistended, soft and nontender. No organomegaly or masses felt. Normal bowel sounds heard. Central nervous system: Alert and oriented. No focal neurological deficits. Extremities: Symmetric 5 x 5 power. Skin: No rashes, lesions or ulcers Psychiatry: Judgement and insight appear normal. Mood & affect appropriate.     Data Reviewed: I have personally reviewed following labs and  imaging studies  CBC: Recent Labs  Lab 12/27/20 1336 12/29/20 0340 12/30/20 0441  WBC 11.5* 6.5 15.6*  NEUTROABS  --  5.3 13.5*  HGB 12.2* 11.2* 11.8*  HCT 38.8* 37.1* 38.3*  MCV 93.9 95.6 93.4  PLT 506* 516* 553*   Basic Metabolic Panel: Recent Labs  Lab 12/27/20 1336 12/27/20 1409 12/28/20 0232 12/29/20 0340 12/30/20 0441  NA 135  --  139 135 137  K 4.0  --  3.5 4.4 4.0  CL 102  --  103 100 97*  CO2 22  --  25 26 26   GLUCOSE 263*  --  179* 393* 256*  BUN 22  --  19 30* 35*  CREATININE 1.57*  --  1.44* 1.55* 1.60*  CALCIUM 8.5*  --  8.5* 8.2* 9.2  MG  --  2.3  --  2.5* 2.7*  PHOS  --   --   --  5.3* 4.5   GFR: Estimated Creatinine Clearance: 50.1 mL/min (A) (by C-G formula based on SCr of 1.6 mg/dL (H)). Liver Function Tests: Recent Labs  Lab 12/28/20 1415  12/29/20 0340 12/30/20 0441  AST 46* 90* 42*  ALT 54* 83* 69*  ALKPHOS 67 70 67  BILITOT 1.3* 1.0 0.5  PROT 6.7 6.6 6.8  ALBUMIN 2.2* 2.2* 2.2*   No results for input(s): LIPASE, AMYLASE in the last 168 hours. No results for input(s): AMMONIA in the last 168 hours. Coagulation Profile: No results for input(s): INR, PROTIME in the last 168 hours. Cardiac Enzymes: No results for input(s): CKTOTAL, CKMB, CKMBINDEX, TROPONINI in the last 168 hours. BNP (last 3 results) No results for input(s): PROBNP in the last 8760 hours. HbA1C: No results for input(s): HGBA1C in the last 72 hours. CBG: Recent Labs  Lab 12/29/20 0806 12/29/20 1140 12/29/20 1647 12/29/20 2058 12/30/20 0631  GLUCAP 374* 404* 213* 267* 338*   Lipid Profile: No results for input(s): CHOL, HDL, LDLCALC, TRIG, CHOLHDL, LDLDIRECT in the last 72 hours. Thyroid Function Tests: No results for input(s): TSH, T4TOTAL, FREET4, T3FREE, THYROIDAB in the last 72 hours. Anemia Panel: Recent Labs    12/29/20 0340 12/30/20 0441  FERRITIN 232 335   Sepsis Labs: Recent Labs  Lab 12/28/20 1415  PROCALCITON <0.10    Recent Results  (from the past 240 hour(s))  Resp Panel by RT-PCR (Flu A&B, Covid) Nasopharyngeal Swab     Status: Abnormal   Collection Time: 12/27/20  4:12 PM   Specimen: Nasopharyngeal Swab; Nasopharyngeal(NP) swabs in vial transport medium  Result Value Ref Range Status   SARS Coronavirus 2 by RT PCR POSITIVE (A) NEGATIVE Final    Comment: RESULT CALLED TO, READ BACK BY AND VERIFIED WITH: B BECK RN 12/27/20 1832 JDW (NOTE) SARS-CoV-2 target nucleic acids are DETECTED.  The SARS-CoV-2 RNA is generally detectable in upper respiratory specimens during the acute phase of infection. Positive results are indicative of the presence of the identified virus, but do not rule out bacterial infection or co-infection with other pathogens not detected by the test. Clinical correlation with patient history and other diagnostic information is necessary to determine patient infection status. The expected result is Negative.  Fact Sheet for Patients: BloggerCourse.com  Fact Sheet for Healthcare Providers: SeriousBroker.it  This test is not yet approved or cleared by the [place] FDA and  has been authorized for detection and/or diagnosis of SARS-CoV-2 by FDA under an Emergency Use Authorization (EUA).  This EUA will remain in effect (meaning this test can be used)  for the duration of  the COVID-19 declaration under Section 564(b)(1) of the Act, 21 U.S.C. section 360bbb-3(b)(1), unless the authorization is terminated or revoked sooner.     Influenza A by PCR NEGATIVE NEGATIVE Final   Influenza B by PCR NEGATIVE NEGATIVE Final    Comment: (NOTE) The Xpert Xpress SARS-CoV-2/FLU/RSV plus assay is intended as an aid in the diagnosis of influenza from Nasopharyngeal swab specimens and should not be used as a sole basis for treatment. Nasal washings and aspirates are unacceptable for Xpert Xpress SARS-CoV-2/FLU/RSV testing.  Fact Sheet for  Patients: BloggerCourse.com  Fact Sheet for Healthcare Providers: SeriousBroker.it  This test is not yet approved or cleared by the [place] FDA and has been authorized for detection and/or diagnosis of SARS-CoV-2 by FDA under an Emergency Use Authorization (EUA). This EUA will remain in effect (meaning this test can be used) for the duration of the COVID-19 declaration under Section 564(b)(1) of the Act, 21 U.S.C. section 360bbb-3(b)(1), unless the authorization is terminated or revoked.  Performed at [facility], [address] [place], [place]  [address]   MRSA Next Gen by PCR, Nasal     Status: None   Collection Time: 12/27/20  9:33 PM   Specimen: Nasal Mucosa; Nasal Swab  Result Value Ref Range Status   MRSA by PCR Next Gen NOT DETECTED NOT DETECTED Final    Comment: (NOTE) The GeneXpert MRSA Assay (FDA approved for NASAL specimens only), is one component of a comprehensive MRSA colonization surveillance program. It is not intended to diagnose MRSA infection nor to guide or monitor treatment for MRSA infections. Test performance is not FDA approved in patients less than 43 years old. Performed at [facility], [address], [place], [place] [address]          Radiology Studies: ECHOCARDIOGRAM COMPLETE  Result Date: 12/28/2020    ECHOCARDIOGRAM REPORT   Patient Name:   ALONSO GAPINSKI Date of Exam: 12/28/2020 Medical Rec #:  [id number]         Height:       74.0 in Accession #:    [id number]        Weight:       246.9 lb Date of Birth:  1941/02/06         BSA:          2.377 m Patient Age:    80 years          BP:           112/67 mmHg Patient Gender: M                 HR:           71 bpm. Exam Location:  Inpatient Procedure: 2D Echo, Color Doppler and Cardiac Doppler Indications:    CHF-Acute Systolic  History:        Patient has prior history of Echocardiogram examinations, most                 recent  10/26/2020. CHF, CAD, Aortic Valve Disease; Risk                 Factors:Hypertension, Diabetes and Dyslipidemia. CKD.  Sonographer:    Ross Ludwig RDCS (AE) Referring Phys: [id number] Roxy Horseman SIMMONS  Sonographer Comments: Suboptimal subcostal window. IMPRESSIONS  1. Inferior basal hypokinesis . Left ventricular ejection fraction, by estimation, is 45 to 50%. The left ventricle has mildly decreased function. The left ventricle has no regional wall motion abnormalities. There is moderate left ventricular hypertrophy. Left ventricular diastolic parameters are consistent with Grade II diastolic dysfunction (pseudonormalization). Elevated left ventricular end-diastolic pressure.  2. Right ventricular systolic function is normal. The right ventricular size is normal.  3. Left atrial size was moderately dilated.  4. Right atrial size was mildly dilated.  5. The mitral valve is abnormal. Mild mitral valve regurgitation. No evidence of mitral stenosis.  6. Likely moderate low flow AS with DVI and AVA low 0.24 and 0.90 cm2 . The aortic valve is tricuspid. There is moderate calcification of the aortic valve. There is moderate thickening of the aortic valve. Aortic valve regurgitation is mild. Moderate aortic valve stenosis.  7. Aortic dilatation noted. There is mild dilatation of the aortic root, measuring 38 mm.  8. The inferior vena cava is normal in size with greater than 50% respiratory variability, suggesting right atrial pressure of 3 mmHg. FINDINGS  Left Ventricle: Inferior basal hypokinesis. Left ventricular ejection fraction, by estimation, is 45 to 50%. The left ventricle has mildly decreased function. The left ventricle has no  regional wall motion abnormalities. The left ventricular internal cavity size was normal in size. There is moderate left ventricular hypertrophy. Left ventricular diastolic parameters are consistent with Grade II diastolic dysfunction (pseudonormalization). Elevated left ventricular  end-diastolic pressure. Right Ventricle: The right ventricular size is normal. No increase in right ventricular wall thickness. Right ventricular systolic function is normal. Left Atrium: Left atrial size was moderately dilated. Right Atrium: Right atrial size was mildly dilated. Pericardium: There is no evidence of pericardial effusion. Mitral Valve: The mitral valve is abnormal. There is moderate thickening of the mitral valve leaflet(s). There is moderate calcification of the mitral valve leaflet(s). Mild mitral annular calcification. Mild mitral valve regurgitation. No evidence of mitral valve stenosis. MV peak gradient, 6.9 mmHg. The mean mitral valve gradient is 2.0 mmHg. Tricuspid Valve: The tricuspid valve is normal in structure. Tricuspid valve regurgitation is not demonstrated. No evidence of tricuspid stenosis. Aortic Valve: Likely moderate low flow AS with DVI and AVA low 0.24 and 0.90 cm2. The aortic valve is tricuspid. There is moderate calcification of the aortic valve. There is moderate thickening of the aortic valve. Aortic valve regurgitation is mild. Moderate aortic stenosis is present. Aortic valve mean gradient measures 14.3 mmHg. Aortic valve peak gradient measures 26.1 mmHg. Aortic valve area, by VTI measures 0.90 cm. Pulmonic Valve: The pulmonic valve was normal in structure. Pulmonic valve regurgitation is not visualized. No evidence of pulmonic stenosis. Aorta: The aortic root is normal in size and structure and aortic dilatation noted. There is mild dilatation of the aortic root, measuring 38 mm. Venous: The inferior vena cava is normal in size with greater than 50% respiratory variability, suggesting right atrial pressure of 3 mmHg. IAS/Shunts: The interatrial septum was not well visualized.  LEFT VENTRICLE PLAX 2D LVIDd:         4.70 cm  Diastology LVIDs:         3.30 cm  LV e' medial:    6.53 cm/s LV PW:         1.50 cm  LV E/e' medial:  20.5 LV IVS:        1.60 cm  LV e' lateral:    5.19 cm/s LVOT diam:     2.20 cm  LV E/e' lateral: 25.8 LV SV:         49 LV SV Index:   21 LVOT Area:     3.80 cm  RIGHT VENTRICLE RV Basal diam:  4.10 cm RV S prime:     11.70 cm/s TAPSE (M-mode): 2.0 cm LEFT ATRIUM             Index       RIGHT ATRIUM           Index LA diam:        4.30 cm 1.81 cm/m  RA Area:     34.40 cm LA Vol (A2C):   78.6 ml 33.07 ml/m RA Volume:   124.00 ml 52.16 ml/m LA Vol (A4C):   76.4 ml 32.14 ml/m LA Biplane Vol: 81.6 ml 34.33 ml/m  AORTIC VALVE AV Area (Vmax):    0.96 cm AV Area (Vmean):   0.90 cm AV Area (VTI):     0.90 cm AV Vmax:           255.67 cm/s AV Vmean:          179.000 cm/s AV VTI:            0.547 m AV Peak Grad:  26.1 mmHg AV Mean Grad:      14.3 mmHg LVOT Vmax:         64.70 cm/s LVOT Vmean:        42.300 cm/s LVOT VTI:          0.129 m LVOT/AV VTI ratio: 0.24  AORTA Ao Root diam: 3.80 cm Ao Asc diam:  3.70 cm MITRAL VALVE                 TRICUSPID VALVE MV Area (PHT): 3.66 cm      TR Peak grad:   26.0 mmHg MV Area VTI:   1.50 cm      TR Vmax:        255.00 cm/s MV Peak grad:  6.9 mmHg MV Mean grad:  2.0 mmHg      SHUNTS MV Vmax:       1.31 m/s      Systemic VTI:  0.13 m MV Vmean:      69.4 cm/s     Systemic Diam: 2.20 cm MV Decel Time: 207 msec MR Peak grad:    100.8 mmHg MR Mean grad:    69.0 mmHg MR Vmax:         502.00 cm/s MR Vmean:        396.0 cm/s MR PISA:         1.01 cm MR PISA Eff ROA: 6 mm MR PISA Radius:  0.40 cm MV E velocity: 134.00 cm/s MV A velocity: 30.50 cm/s MV E/A ratio:  4.39 Charlton Haws MD Electronically signed by Charlton Haws MD Signature Date/Time: 12/28/2020/3:39:54 PM    Final         Scheduled Meds:  apixaban  5 mg Oral BID   vitamin C  500 mg Oral Daily   baricitinib  2 mg Oral Daily   carvedilol  12.5 mg Oral BID WC   Chlorhexidine Gluconate Cloth  6 each Topical Daily   empagliflozin  25 mg Oral QAC breakfast   ezetimibe  10 mg Oral Daily   icosapent Ethyl  2 g Oral BID   insulin aspart  0-20 Units  Subcutaneous TID WC   insulin aspart  4 Units Subcutaneous TID WC   insulin detemir  15 Units Subcutaneous Daily   loratadine  10 mg Oral Daily   pantoprazole  40 mg Oral Daily   [START ON 01/01/2021] predniSONE  10 mg Oral Q breakfast   predniSONE  20 mg Oral Q breakfast   [START ON 01/03/2021] predniSONE  5 mg Oral Q breakfast   ranolazine  500 mg Oral BID   rosuvastatin  40 mg Oral Daily   sacubitril-valsartan  1 tablet Oral BID   sodium chloride flush  10-40 mL Intracatheter Q12H   sodium chloride flush  3 mL Intravenous Q12H   torsemide  20 mg Oral Daily   zinc sulfate  220 mg Oral Daily   Continuous Infusions:  sodium chloride     remdesivir 100 mg in NS 100 mL 100 mg (12/29/20 0933)     LOS: 3 days    Time spent: 25 minutes    Dorcas Carrow, MD [facility] Pager [phone]

## 2020-12-30 NOTE — Progress Notes (Addendum)
Advanced Heart Failure Rounding Note  PCP-Cardiologist: David Ancona, MD   Patient Profile    80 y/o male w/ h/o CAD, difficult to control Afib w/ prior failed DCCVs and recent Afib ablation 6/22 w/ recurrence, h/o amiodarone toxicity, h/o systolic heart failure felt to be tachymediated w/ improved EF 25-30%>>50% and stage III CKD, admitted for a/c CHF in the setting of recurrent Afib w/ RVR.   In ED, found to be COVID +.  Bedside echo showed EF 40-45% with mild to moderate AS    Subjective:   S/p DCCV on 08/16. Maintaining sinus rhythm this am.  O2 requirements continue to go down, now on [place] 3 L/min. Admit CXR w/ increased bilateral perihilar and basilar opacities.   Dyspnea markedly improved.  AF. WBC 11.5 > 6.0 > 15.6   CVP 5-6 last night. Unable to obtain reliable waveform this am.  SCr 1.57>>1.44 >> 1.55 >> 1.60 K 4.0    Objective:   Weight Range: 113.1 kg Body mass index is 32.01 kg/m.   Vital Signs:   Temp:  [97.5 F (36.4 C)-98 F (36.7 C)] 97.5 F (36.4 C) (08/18 0801) Pulse Rate:  [62-94] 72 (08/18 0801) Resp:  [13-20] 16 (08/18 0801) BP: (103-131)/(63-80) 131/80 (08/18 0801) SpO2:  [91 %-95 %] 94 % (08/18 0801) Weight:  [113.1 kg] 113.1 kg (08/18 0458) Last BM Date: 12/27/20  Weight change: Filed Weights   12/28/20 1221 12/29/20 0340 12/30/20 0458  Weight: 112 kg 112.2 kg 113.1 kg    Intake/Output:   Intake/Output Summary (Last 24 hours) at 12/30/2020 0810 Last data filed at 12/30/2020 0440 Gross per 24 hour  Intake 1064.39 ml  Output 2075 ml  Net -1010.61 ml      Physical Exam   CVP 5-6 last night General:  Well appearing but on HFNC HEENT: Normal Neck: Supple. No JVD. Carotids 2+ bilat; no bruits. No lymphadenopathy or thyromegaly appreciated. Cor: PMI nondisplaced. Regular rhythm. 3/6 crescendo decrescendo SEM best heard at RUSB with preserved S2. Lungs: Bibasilar crackles Abdomen: Soft, nontender, nondistended. No  hepatosplenomegaly. No bruits or masses. Good bowel sounds. Extremities: No cyanosis, clubbing, rash, edema Neuro: Alert & orientedx3, cranial nerves grossly intact. moves all 4 extremities w/o difficulty. Affect pleasant   Telemetry   Sinus, 70s-80s  EKG    No new EKG to review   Labs    CBC Recent Labs    12/29/20 0340 12/30/20 0441  WBC 6.5 15.6*  NEUTROABS 5.3 13.5*  HGB 11.2* 11.8*  HCT 37.1* 38.3*  MCV 95.6 93.4  PLT 516* 553*   Basic Metabolic Panel Recent Labs    32/67/12 0340 12/30/20 0441  NA 135 137  K 4.4 4.0  CL 100 97*  CO2 26 26  GLUCOSE 393* 256*  BUN 30* 35*  CREATININE 1.55* 1.60*  CALCIUM 8.2* 9.2  MG 2.5* 2.7*  PHOS 5.3* 4.5   Liver Function Tests Recent Labs    12/29/20 0340 12/30/20 0441  AST 90* 42*  ALT 83* 69*  ALKPHOS 70 67  BILITOT 1.0 0.5  PROT 6.6 6.8  ALBUMIN 2.2* 2.2*   No results for input(s): LIPASE, AMYLASE in the last 72 hours. Cardiac Enzymes No results for input(s): CKTOTAL, CKMB, CKMBINDEX, TROPONINI in the last 72 hours.  BNP: BNP (last 3 results) Recent Labs    08/31/20 1547 09/28/20 1225 12/27/20 1409  BNP 124.8* 360.9* 502.6*    ProBNP (last 3 results) No results for input(s): PROBNP in the last  8760 hours.   D-Dimer Recent Labs    12/29/20 0340 12/30/20 0441  DDIMER 2.19* 1.71*   Hemoglobin A1C No results for input(s): HGBA1C in the last 72 hours. Fasting Lipid Panel No results for input(s): CHOL, HDL, LDLCALC, TRIG, CHOLHDL, LDLDIRECT in the last 72 hours. Thyroid Function Tests No results for input(s): TSH, T4TOTAL, T3FREE, THYROIDAB in the last 72 hours.  Invalid input(s): FREET3  Other results:   Imaging    No results found.   Medications:     Scheduled Medications:  apixaban  5 mg Oral BID   vitamin C  500 mg Oral Daily   baricitinib  2 mg Oral Daily   carvedilol  12.5 mg Oral BID WC   Chlorhexidine Gluconate Cloth  6 each Topical Daily   dexamethasone (DECADRON)  injection  6 mg Intravenous Q24H   empagliflozin  25 mg Oral QAC breakfast   ezetimibe  10 mg Oral Daily   icosapent Ethyl  2 g Oral BID   insulin aspart  0-20 Units Subcutaneous TID WC   insulin aspart  4 Units Subcutaneous TID WC   insulin detemir  15 Units Subcutaneous Daily   pantoprazole  40 mg Oral Daily   ranolazine  500 mg Oral BID   rosuvastatin  40 mg Oral Daily   sacubitril-valsartan  1 tablet Oral BID   sodium chloride flush  10-40 mL Intracatheter Q12H   sodium chloride flush  3 mL Intravenous Q12H   torsemide  20 mg Oral Daily   zinc sulfate  220 mg Oral Daily    Infusions:  sodium chloride     remdesivir 100 mg in NS 100 mL 100 mg (12/29/20 0933)    PRN Medications: sodium chloride, acetaminophen, chlorpheniramine-HYDROcodone, guaiFENesin-dextromethorphan, Ipratropium-Albuterol, nitroGLYCERIN, sodium chloride flush, sodium chloride flush    Assessment/Plan   1. Recurrent Atrial Fibrillation w/ RVR: Had successful DC-CV on 03/29/18 but then back in atrial fibrillation. Ranolazine added, but failed DCCV again 04/02/18. DCCV 04/19/18 was successful.  AF with RVR at 3/22 admission, amiodarone restarted and he was cardioverted to NSR, but he developed amiodarone lung toxicity.  Amiodarone was stopped.  He had subsequently gone back into rate-controlled atrial fibrillation. Underwent Afib Ablation 6/22. Now w/ recurrent PAF w/ RVR.  - S/p DCCV on 08/16, maintaining sinus rhythm. EP consulted. No antiarrhythmic therapy recommended. Within 3 mo post ablation window. - Continue Eliquis 5 mg bid - Continue Coreg 12.5 mg bid - Continue ranolazine 500 mg bid  - No amiodarone with history of lung toxicity.   - Stop digoxin - Keep K > 4.0 and Mg >2.0  2. Acute on Chronic systolic=>diastolic CHF: Prior ischemic cardiomyopathy, improved after PCI in 2017.  Echo 03/2018 with EF back down to 35-40% and moderate-severe RV dysfunction. This was in the setting of atrial fibrillation  with RVR. EF 40% on TEE 04/2018.  Echo was in 3/20, showing EF up to 50%.  Suspect that he primarily had a tachycardia-mediated CMP at that point.  Echo in 7/21 with EF 25-30%, severe RV dysfunction.  TEE in 10/21, however, showed EF up to 50%.  He had PCI to LAD in 10/21. Echo in 3/22 showed stable EF 50-55% with normal RV.  TEE 6/22 at time of Afib ablation showed stable EF 50-55% w/ normal RV. Now w/ acute on chronic CHF in setting of recurrent afib w/ RVR. Bedside Echo showed mildly reduced EF now 40-45%. TTE 08/16 with EF 45-50%, GIIDD, RV okay, likely moderate low  flow AS - CVP 5 overnight. Continue 20 mg Torsemide daily. Creatinine stable. - Continue Entresto to 24/26 bid (dose recently reduced given improved EF and bump in SCr) - Continue Coreg 12.5 mg bid (did not tolerate 25 mg dose due to low BP and dizziness)  - Continue Jardiance 25 mg daily  3. CKD: Stage 3.  SCr 1.57 on admit c/w baseline. Scr 1.60 today - follow BMP w/ diuresis  4. CAD: History of DES to RCA in 9/17.  Underwent LHC 04/03/18 with PCI to prox left circ. LHC in 10/21 with PCI to LAD.   - He is on Eliquis so no ASA.   - He is now off Plavix (finished 6 month therapy)  - Continue statin.  5. DM2: on Jardiance  -SSI  -A1c 10 in June -Appreciate input from diabetes coordinator 6. Aortic stenosis: Mild on 3/22 echo.  -Echo this admit with likely moderate low flow AS, DVI 0.24 and AVA 0.90 cm2. Mean gradient 14 mmhg 7. Mitral regurgitation: Mild on 3/22 echo.  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 He is off amiodarone.  He improved with steroids.   - Continue steroid taper per pulmonary, he is followed by pulmonary as outpatient - Bactrim for PJP prophylaxis  9. Acute on Chronic Hypoxic Respiratory Failure  - h/o amio toxicity, remains on steroid taper (prednisone 5 mg/day). Discussed with hospitalist, Dr. Jerral Ralph. Will complete steroid taper with Decadron at discharge. Appreciate input. - hypoxia exacerbated  by a/c CHF, COVID 19 infection + Afib w/ RVR - c/w diuresis - [facility] consulted. Appreciate input.  - Wean 02 as able. Still on 3L [place]. May need home 02. - Awaiting sputum culture - Procalcitonin < 0.10 10. COIVD 19 Infection  - CXR w/ ? PNA.  - TRH consulted as above - Remdesivir, Baricitinib, Decadron per TRH - Ddimer 2.19, CRP 25.7 > 26.3  Disposition: -Discussed with [facility]. Recommend one more day of monitoring to complete 3 day course of Remdisivir, hypoxia (not requiring 02 at home) and complexity of medical issues -Will plan for discharge tomorrow -TOC/CSW assisting with home 02 and HHRN/HHPT  Length of Stay: 3  FINCH, LINDSAY N, PA-C  12/30/2020, 8:10 AM  Advanced Heart Failure Team Pager [phone] (M-F; 7a - 5p)  Please contact [facility] for night-coverage after hours (5p -7a ) and weekends on [email or web address]  Patient seen with PA, agree with the above note.   He remains in NSR.  CVP 5 last night.  Feeling better, still on 3L [place].   General: NAD Neck: No JVD, no thyromegaly or thyroid nodule.  Lungs: Slight crackles at bases.  CV: Nondisplaced PMI.  Heart regular S1/S2, no S3/S4, no murmur.  No peripheral edema.   Abdomen: Soft, nontender, no hepatosplenomegaly, no distention.  Skin: Intact without lesions or rashes.  Neurologic: Alert and oriented x 3.  Psych: Normal affect. Extremities: No clubbing or cyanosis.  HEENT: Normal.   From a cardiac standpoint, he is stable.  No changes to current regimen.   Treating COVID-19 per [facility].  Aiming for 1 more day for IV remdesivir then home tomorrow.  Decrease oxygen down to 2L [place] today.   David Huynh 12/30/2020 10:50 AM

## 2020-12-31 ENCOUNTER — Other Ambulatory Visit (HOSPITAL_COMMUNITY): Payer: Self-pay

## 2020-12-31 DIAGNOSIS — U071 COVID-19: Secondary | ICD-10-CM | POA: Diagnosis not present

## 2020-12-31 DIAGNOSIS — I5043 Acute on chronic combined systolic (congestive) and diastolic (congestive) heart failure: Secondary | ICD-10-CM | POA: Diagnosis not present

## 2020-12-31 LAB — GLUCOSE, CAPILLARY
Glucose-Capillary: 240 mg/dL — ABNORMAL HIGH (ref 70–99)
Glucose-Capillary: 257 mg/dL — ABNORMAL HIGH (ref 70–99)

## 2020-12-31 LAB — CBC WITH DIFFERENTIAL/PLATELET
Abs Immature Granulocytes: 0.3 10*3/uL — ABNORMAL HIGH (ref 0.00–0.07)
Basophils Absolute: 0 10*3/uL (ref 0.0–0.1)
Basophils Relative: 0 %
Eosinophils Absolute: 0 10*3/uL (ref 0.0–0.5)
Eosinophils Relative: 0 %
HCT: 38 % — ABNORMAL LOW (ref 39.0–52.0)
Hemoglobin: 12.2 g/dL — ABNORMAL LOW (ref 13.0–17.0)
Immature Granulocytes: 2 %
Lymphocytes Relative: 11 %
Lymphs Abs: 1.4 10*3/uL (ref 0.7–4.0)
MCH: 29.5 pg (ref 26.0–34.0)
MCHC: 32.1 g/dL (ref 30.0–36.0)
MCV: 92 fL (ref 80.0–100.0)
Monocytes Absolute: 0.6 10*3/uL (ref 0.1–1.0)
Monocytes Relative: 5 %
Neutro Abs: 10.1 10*3/uL — ABNORMAL HIGH (ref 1.7–7.7)
Neutrophils Relative %: 82 %
Platelets: 528 10*3/uL — ABNORMAL HIGH (ref 150–400)
RBC: 4.13 MIL/uL — ABNORMAL LOW (ref 4.22–5.81)
RDW: 13.7 % (ref 11.5–15.5)
WBC: 12.4 10*3/uL — ABNORMAL HIGH (ref 4.0–10.5)
nRBC: 0 % (ref 0.0–0.2)

## 2020-12-31 LAB — COMPREHENSIVE METABOLIC PANEL
ALT: 62 U/L — ABNORMAL HIGH (ref 0–44)
AST: 32 U/L (ref 15–41)
Albumin: 2.2 g/dL — ABNORMAL LOW (ref 3.5–5.0)
Alkaline Phosphatase: 66 U/L (ref 38–126)
Anion gap: 8 (ref 5–15)
BUN: 43 mg/dL — ABNORMAL HIGH (ref 8–23)
CO2: 29 mmol/L (ref 22–32)
Calcium: 8.7 mg/dL — ABNORMAL LOW (ref 8.9–10.3)
Chloride: 101 mmol/L (ref 98–111)
Creatinine, Ser: 1.44 mg/dL — ABNORMAL HIGH (ref 0.61–1.24)
GFR, Estimated: 49 mL/min — ABNORMAL LOW (ref >60)
Glucose, Bld: 232 mg/dL — ABNORMAL HIGH (ref 70–99)
Potassium: 4.2 mmol/L (ref 3.5–5.1)
Sodium: 138 mmol/L (ref 135–145)
Total Bilirubin: 0.7 mg/dL (ref 0.3–1.2)
Total Protein: 6.6 g/dL (ref 6.5–8.1)

## 2020-12-31 LAB — FERRITIN: Ferritin: 261 ng/mL (ref 24–336)

## 2020-12-31 LAB — MAGNESIUM: Magnesium: 2.5 mg/dL — ABNORMAL HIGH (ref 1.7–2.4)

## 2020-12-31 LAB — D-DIMER, QUANTITATIVE: D-Dimer, Quant: 1.25 ug/mL-FEU — ABNORMAL HIGH (ref 0.00–0.50)

## 2020-12-31 LAB — PHOSPHORUS: Phosphorus: 4.7 mg/dL — ABNORMAL HIGH (ref 2.5–4.6)

## 2020-12-31 LAB — C-REACTIVE PROTEIN: CRP: 9.7 mg/dL — ABNORMAL HIGH (ref <1.0)

## 2020-12-31 MED ORDER — PREDNISONE 10 MG PO TABS
ORAL_TABLET | ORAL | 0 refills | Status: AC
  Filled 2020-12-31: qty 7, 6d supply, fill #0

## 2020-12-31 NOTE — Plan of Care (Signed)
  Problem: Education: Goal: Knowledge of General Education information will improve Description: Including pain rating scale, medication(s)/side effects and non-pharmacologic comfort measures Outcome: Progressing   Problem: Health Behavior/Discharge Planning: Goal: Ability to manage health-related needs will improve Outcome: Progressing   Problem: Clinical Measurements: Goal: Ability to maintain clinical measurements within normal limits will improve Outcome: Progressing Goal: Will remain free from infection Outcome: Progressing Goal: Diagnostic test results will improve Outcome: Progressing Goal: Respiratory complications will improve Outcome: Progressing Goal: Cardiovascular complication will be avoided Outcome: Progressing   Problem: Activity: Goal: Risk for activity intolerance will decrease Outcome: Progressing   Problem: Nutrition: Goal: Adequate nutrition will be maintained Outcome: Progressing   Problem: Coping: Goal: Level of anxiety will decrease Outcome: Progressing   Problem: Elimination: Goal: Will not experience complications related to bowel motility Outcome: Progressing Goal: Will not experience complications related to urinary retention Outcome: Progressing   Problem: Pain Managment: Goal: General experience of comfort will improve Outcome: Progressing   Problem: Safety: Goal: Ability to remain free from injury will improve Outcome: Progressing   Problem: Skin Integrity: Goal: Risk for impaired skin integrity will decrease Outcome: Progressing   Problem: Education: Goal: Knowledge of disease or condition will improve Outcome: Progressing Goal: Understanding of medication regimen will improve Outcome: Progressing Goal: Individualized Educational Video(s) Outcome: Progressing

## 2020-12-31 NOTE — Progress Notes (Signed)
Patient refused to take home oxygen tank that was delivered to his room, he stated he would not need this, social worker notified and tank should be picked up at nurses station by oxygen supplier

## 2020-12-31 NOTE — Progress Notes (Addendum)
Advanced Heart Failure Rounding Note  PCP-Cardiologist: [name], MD   Patient Profile    80 y/o male w/ h/o CAD, difficult to control Afib w/ prior failed DCCVs and recent Afib ablation 6/22 w/ recurrence, h/o amiodarone toxicity, h/o systolic heart failure felt to be tachymediated w/ improved EF 25-30%>>50% and stage III CKD, admitted for a/c CHF in the setting of recurrent Afib w/ RVR.   In ED, found to be COVID +.  Bedside echo showed EF 40-45% with mild to moderate AS    Subjective:   S/p DCCV on 08/16. Maintaining sinus rhythm.  02 requirements improved from admit, on 3L [place]. Desaturations to upper 80s while asleep  Unable to get CVP. RN flushing and reconnecting line. Last CVP 6 yesterday. Dyspnea significantly improved. Congestion resolved.   Weight down 9 lb, initial charted weight likely inaccurate  Creatinine stable between 1.4-1.6. -3L last 24 hrs on 20 mg Torsemide daily (home dose)  Objective:   Weight Range: 110.7 kg Body mass index is 31.33 kg/m.   Vital Signs:   Temp:  [97.3 F (36.3 C)-98.5 F (36.9 C)] 98.5 F (36.9 C) (08/19 0355) Pulse Rate:  [60-84] 62 (08/19 0355) Resp:  [15-19] 18 (08/19 0355) BP: (106-134)/(64-83) 113/64 (08/19 0355) SpO2:  [91 %-97 %] 93 % (08/19 0355) Weight:  [110.7 kg] 110.7 kg (08/19 0635) Last BM Date: 12/27/20  Weight change: Filed Weights   12/29/20 0340 12/30/20 0458 12/31/20 0635  Weight: 112.2 kg 113.1 kg 110.7 kg    Intake/Output:   Intake/Output Summary (Last 24 hours) at 12/31/2020 0659 Last data filed at 12/31/2020 5300 Gross per 24 hour  Intake 960 ml  Output 3975 ml  Net -3015 ml      Physical Exam   General:  Well appearing, comfortable on 3L 02. No resp difficulty HEENT: normal Neck: supple. no JVD. Carotids 2+ bilat; no bruits. No lymphadenopathy or thryomegaly appreciated. Cor: PMI nondisplaced. Regular rate & rhythm. No rubs, gallops, 3/6 SEM RUSB with preserved S2 Lungs:  Diminshed Abdomen: soft, nontender, nondistended. No hepatosplenomegaly. No bruits or masses. Good bowel sounds. Extremities: no cyanosis, clubbing, rash, edema Neuro: alert & orientedx3, cranial nerves grossly intact. moves all 4 extremities w/o difficulty. Affect pleasant    Telemetry   NSR 60s, PACs/PVCs  EKG    No new EKG to review   Labs    CBC Recent Labs    12/30/20 0441 12/31/20 0406  WBC 15.6* 12.4*  NEUTROABS 13.5* 10.1*  HGB 11.8* 12.2*  HCT 38.3* 38.0*  MCV 93.4 92.0  PLT 553* 528*   Basic Metabolic Panel Recent Labs    51/10/21 0441 12/31/20 0406  NA 137 138  K 4.0 4.2  CL 97* 101  CO2 26 29  GLUCOSE 256* 232*  BUN 35* 43*  CREATININE 1.60* 1.44*  CALCIUM 9.2 8.7*  MG 2.7* 2.5*  PHOS 4.5 4.7*   Liver Function Tests Recent Labs    12/30/20 0441 12/31/20 0406  AST 42* 32  ALT 69* 62*  ALKPHOS 67 66  BILITOT 0.5 0.7  PROT 6.8 6.6  ALBUMIN 2.2* 2.2*   No results for input(s): LIPASE, AMYLASE in the last 72 hours. Cardiac Enzymes No results for input(s): CKTOTAL, CKMB, CKMBINDEX, TROPONINI in the last 72 hours.  BNP: BNP (last 3 results) Recent Labs    08/31/20 1547 09/28/20 1225 12/27/20 1409  BNP 124.8* 360.9* 502.6*    ProBNP (last 3 results) No results for input(s): PROBNP in  the last 8760 hours.   D-Dimer Recent Labs    12/30/20 0441 12/31/20 0406  DDIMER 1.71* 1.25*   Hemoglobin A1C No results for input(s): HGBA1C in the last 72 hours. Fasting Lipid Panel No results for input(s): CHOL, HDL, LDLCALC, TRIG, CHOLHDL, LDLDIRECT in the last 72 hours. Thyroid Function Tests No results for input(s): TSH, T4TOTAL, T3FREE, THYROIDAB in the last 72 hours.  Invalid input(s): FREET3  Other results:   Imaging    No results found.   Medications:     Scheduled Medications:  apixaban  5 mg Oral BID   vitamin C  500 mg Oral Daily   baricitinib  2 mg Oral Daily   carvedilol  12.5 mg Oral BID WC   Chlorhexidine  Gluconate Cloth  6 each Topical Daily   empagliflozin  25 mg Oral QAC breakfast   ezetimibe  10 mg Oral Daily   icosapent Ethyl  2 g Oral BID   insulin aspart  0-20 Units Subcutaneous TID WC   insulin aspart  4 Units Subcutaneous TID WC   insulin detemir  15 Units Subcutaneous Daily   loratadine  10 mg Oral Daily   pantoprazole  40 mg Oral Daily   [START ON 01/01/2021] predniSONE  10 mg Oral Q breakfast   [START ON 01/03/2021] predniSONE  5 mg Oral Q breakfast   ranolazine  500 mg Oral BID   rosuvastatin  40 mg Oral Daily   sacubitril-valsartan  1 tablet Oral BID   sodium chloride flush  10-40 mL Intracatheter Q12H   sodium chloride flush  3 mL Intravenous Q12H   torsemide  20 mg Oral Daily   zinc sulfate  220 mg Oral Daily    Infusions:  sodium chloride     remdesivir 100 mg in NS 100 mL 100 mg (12/30/20 1224)    PRN Medications: sodium chloride, acetaminophen, chlorpheniramine-HYDROcodone, guaiFENesin-dextromethorphan, Ipratropium-Albuterol, nitroGLYCERIN, sodium chloride flush, sodium chloride flush    Assessment/Plan   1. Recurrent Atrial Fibrillation w/ RVR: Had successful DC-CV on 03/29/18 but then back in atrial fibrillation. Ranolazine added, but failed DCCV again 04/02/18. DCCV 04/19/18 was successful.  AF with RVR at 3/22 admission, amiodarone restarted and he was cardioverted to NSR, but he developed amiodarone lung toxicity.  Amiodarone was stopped.  He had subsequently gone back into rate-controlled atrial fibrillation. Underwent Afib Ablation 6/22. Now w/ recurrent PAF w/ RVR.  - S/p DCCV on 08/16, maintaining sinus rhythm. EP consulted. No antiarrhythmic therapy recommended. Within 3 mo post ablation window. - Continue Eliquis 5 mg bid - Continue Coreg 12.5 mg bid - Continue ranolazine 500 mg bid  - No amiodarone with history of lung toxicity.   - Keep off digoxin - Keep K > 4.0 and Mg >2.0  2. Acute on Chronic systolic=>diastolic CHF: Prior ischemic  cardiomyopathy, improved after PCI in 2017.  Echo 03/2018 with EF back down to 35-40% and moderate-severe RV dysfunction. This was in the setting of atrial fibrillation with RVR. EF 40% on TEE 04/2018.  Echo was in 3/20, showing EF up to 50%.  Suspect that he primarily had a tachycardia-mediated CMP at that point.  Echo in 7/21 with EF 25-30%, severe RV dysfunction.  TEE in 10/21, however, showed EF up to 50%.  He had PCI to LAD in 10/21. Echo in 3/22 showed stable EF 50-55% with normal RV.  TEE 6/22 at time of Afib ablation showed EF 50-55% w/ normal RV. Now w/ acute on chronic CHF in setting  of recurrent afib w/ RVR. Bedside Echo showed mildly reduced EF 40-45%. TTE 08/16 with EF 45-50%, GIIDD, RV okay, likely moderate low flow AS - Good diuresis with 20 mg Torsemide daily. Weight down 5 # overnight and 9 lb from admit. Volume status appears low. Last CVP 6. -3L last 24 hrs. Scr stable. BUN trending up. Hold Torsemide today. Will restart tomorrow. - Continue Entresto to 24/26 bid (dose recently reduced given improved EF and bump in SCr) - Continue Coreg 12.5 mg bid (did not tolerate 25 mg dose due to low BP and dizziness)  - Continue Jardiance 25 mg daily  3. CKD: Stage 3b.  SCr 1.57 on admit c/w baseline. Scr 1.44 today.  - Continue to monitor with diuresis 4. CAD: History of DES to RCA in 9/17.  Underwent LHC 04/03/18 with PCI to prox left circ. LHC in 10/21 with PCI to LAD.   - He is on Eliquis so no ASA.   - He is now off Plavix (finished 6 month therapy)  - Continue statin.  - Stable. No CP. 5. DM2: on Jardiance  -SSI  -A1c 10 in June -Appreciate input from diabetes coordinator -Elevated blood glucose in setting of steroid use 6. Aortic stenosis: Mild on 3/22 echo.  -Echo this admit with likely moderate low flow AS, DVI 0.24 and AVA 0.90 cm2. Mean gradient 14 mmhg 7. Mitral regurgitation: Mild on 3/22 echo.  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 He is off amiodarone.  He  improved with steroids.   - Continue steroid taper per pulmonary, he is followed by pulmonary as outpatient - Bactrim for PJP prophylaxis  9. Acute on Chronic Hypoxic Respiratory Failure  - h/o amio toxicity, remains on steroid taper (prednisone 5 mg/day). Discussed with TRH. Will provider patient instructions to complete steroid taper at discharge. Appreciate input. - hypoxia exacerbated by a/c CHF, COVID 19 infection + Afib w/ RVR - c/w diuresis - TRH consulted. Appreciate input.  - Wean 02 as able. Still on 3L [place]. Likely needs 02 at D/C, TOC CM assisting - Procalcitonin < 0.10 10. COIVD 19 Infection  - CXR w/ ? PNA.  - TRH consulted as above - Remdesivir, Baricitinib, Decadron per [facility] - Ddimer 2.19, CRP 25.7 > 26.3  Disposition: -Plan for discharge later today -TOC/CSW assisting with home 02 and HHRN/HHPT, Face to Face completed  Length of Stay: 4  FINCH, LINDSAY N, PA-C  12/31/2020, 6:59 AM  Advanced Heart Failure Team Pager [phone] (M-F; 7a - 5p)  Please contact [facility] for night-coverage after hours (5p -7a ) and weekends on [email or web address]  Patient seen with PA, agree with the above note.   He remains in NSR today.  Last CVP was 6, line not working this morning.  Still on 3L oxygen but denies dyspnea. Creatinine 1.4.   General: NAD Neck: No JVD, no thyromegaly or thyroid nodule.  Lungs: Mild basilar crackles. . CV: Nondisplaced PMI.  Heart regular S1/S2, no S3/S4, no murmur.  No peripheral edema.   Abdomen: Soft, nontender, no hepatosplenomegaly, no distention.  Skin: Intact without lesions or rashes.  Neurologic: Alert and oriented x 3.  Psych: Normal affect. Extremities: No clubbing or cyanosis.  HEENT: Normal.    I think that Mr Harriman is stable to go home today.  Appreciate [facility] assistance with DM and COVID-19.  He will need home oxygen for the time being.  Given underlying lung damage with amiodarone toxicity, may take a little longer before he recovers  enough from COVID-19 to come back off oxygen.  We will arrange CHF clinic followup.   Cardiac meds for  home: Entresto 24/26 bid, torsemide 20 mg daily, Eliquis 5 mg bid, Coreg 12.5 mg bid, empagliflozin 25 daily, Zetia, Vascepa, prednisone taper (taper down to prior dosing as per outpatient pulmonary), ranolazine 500 mg bid, Crestor 40 mg daily.   [name] 12/31/2020 8:35 AM

## 2020-12-31 NOTE — TOC Transition Note (Signed)
Transition of Care [facility]) - CM/SW Discharge Note   Patient Details  Name: David Huynh MRN: [id number] Date of Birth: [birth year]  Transition of [facility]) CM/SW Contact:  Elliot Cousin, RN Phone Number: [phone] 12/31/2020, 11:16 AM   Clinical Narrative:     TOC CM spoke to pt's wife, [name]. States she will provide transportation home. Contacted [facility] RN, Eber Jones with [facility] for scheduled dc today. Contacted [employer] rep, Ian Malkin to have portable delivered to room prior to dc. The DME provider will contact wife to service his oxygen concentrator at home. Wife states he has not used since April 2022. Attending updated. Message sent to Unit RN.   Final next level of care: Home w Home Health Services Barriers to Discharge: No Barriers Identified   Patient Goals and CMS Choice Patient states their goals for this hospitalization and ongoing recovery are:: want to go home to full recover CMS Medicare.gov Compare Post Acute Care list provided to:: Patient Represenative (must comment) (wife, [name]) Choice offered to / list presented to : Spouse  Discharge Placement                       Discharge Plan and Services In-house Referral: Clinical Social Work Discharge Planning Services: CM Consult Post Acute Care Choice: Home Health          DME Arranged: Oxygen DME Agency: [employer] Date DME Agency Contacted: 12/31/20 Time DME Agency Contacted: 1115 Representative spoke with at DME Agency: Oletha Cruel   [facility] Agency: [facility] Date [facility] Agency Contacted: 12/31/20 Time HH Agency Contacted: 1116 Representative spoke with at [facility] Agency: Rhunette Croft  Social Determinants of Health (SDOH) Interventions     Readmission Risk Interventions Readmission Risk Prevention Plan 08/20/2020  Transportation Screening Complete  PCP or Specialist Appt within 3-5 Days Complete  HRI or Home Care Consult Complete  Social Work Consult for  Recovery Care Planning/Counseling Complete  Palliative Care Screening Not Applicable  Medication Review [employer]) Complete  Some recent data might be hidden

## 2021-01-04 ENCOUNTER — Telehealth: Payer: Self-pay | Admitting: Family Medicine

## 2021-01-04 NOTE — Telephone Encounter (Signed)
Eber Jones is calling with [employer], She states that the patient lives at [facility] independent living and he is refusing physical therapy, occupational therapy, and nursing assistance. [facility]'s director stated that Eber Jones needed to contact us and let Dr. Abner Greenspan know to see if we could assist, since the patient listen to his doctor but not anyone else. She can be contacted at [phone].

## 2021-01-05 ENCOUNTER — Other Ambulatory Visit: Payer: Self-pay | Admitting: Family Medicine

## 2021-01-05 NOTE — Telephone Encounter (Signed)
Called pt to schedule for vv for Friday but no answer

## 2021-01-07 NOTE — Telephone Encounter (Signed)
Spoke with [facility] and they are aware of the refusal

## 2021-01-10 ENCOUNTER — Other Ambulatory Visit: Payer: Self-pay

## 2021-01-10 ENCOUNTER — Encounter (HOSPITAL_COMMUNITY): Payer: Self-pay | Admitting: Physician Assistant

## 2021-01-10 ENCOUNTER — Ambulatory Visit (HOSPITAL_COMMUNITY)
Admit: 2021-01-10 | Discharge: 2021-01-10 | Disposition: A | Discharge status: Home / Self Care | Payer: Medicare Other | Attending: Physician Assistant | Admitting: Physician Assistant

## 2021-01-10 VITALS — BP 90/62 | HR 88 | Ht 74.0 in | Wt 242.6 lb

## 2021-01-10 DIAGNOSIS — I4819 Other persistent atrial fibrillation: Secondary | ICD-10-CM | POA: Diagnosis not present

## 2021-01-10 DIAGNOSIS — Z794 Long term (current) use of insulin: Secondary | ICD-10-CM | POA: Diagnosis not present

## 2021-01-10 DIAGNOSIS — I491 Atrial premature depolarization: Secondary | ICD-10-CM | POA: Insufficient documentation

## 2021-01-10 DIAGNOSIS — I081 Rheumatic disorders of both mitral and tricuspid valves: Secondary | ICD-10-CM | POA: Insufficient documentation

## 2021-01-10 DIAGNOSIS — E785 Hyperlipidemia, unspecified: Secondary | ICD-10-CM | POA: Diagnosis not present

## 2021-01-10 DIAGNOSIS — I5042 Chronic combined systolic (congestive) and diastolic (congestive) heart failure: Secondary | ICD-10-CM | POA: Insufficient documentation

## 2021-01-10 DIAGNOSIS — N183 Chronic kidney disease, stage 3 unspecified: Secondary | ICD-10-CM | POA: Diagnosis not present

## 2021-01-10 DIAGNOSIS — I4892 Unspecified atrial flutter: Secondary | ICD-10-CM | POA: Diagnosis not present

## 2021-01-10 DIAGNOSIS — D6869 Other thrombophilia: Secondary | ICD-10-CM | POA: Insufficient documentation

## 2021-01-10 DIAGNOSIS — Z7984 Long term (current) use of oral hypoglycemic drugs: Secondary | ICD-10-CM | POA: Insufficient documentation

## 2021-01-10 DIAGNOSIS — Z6831 Body mass index (BMI) 31.0-31.9, adult: Secondary | ICD-10-CM | POA: Diagnosis not present

## 2021-01-10 DIAGNOSIS — Z7901 Long term (current) use of anticoagulants: Secondary | ICD-10-CM | POA: Diagnosis not present

## 2021-01-10 DIAGNOSIS — E669 Obesity, unspecified: Secondary | ICD-10-CM | POA: Insufficient documentation

## 2021-01-10 DIAGNOSIS — Z79899 Other long term (current) drug therapy: Secondary | ICD-10-CM | POA: Diagnosis not present

## 2021-01-10 DIAGNOSIS — Z7952 Long term (current) use of systemic steroids: Secondary | ICD-10-CM | POA: Insufficient documentation

## 2021-01-10 DIAGNOSIS — Z888 Allergy status to other drugs, medicaments and biological substances status: Secondary | ICD-10-CM | POA: Insufficient documentation

## 2021-01-10 DIAGNOSIS — I110 Hypertensive heart disease with heart failure: Secondary | ICD-10-CM | POA: Diagnosis not present

## 2021-01-10 DIAGNOSIS — E1122 Type 2 diabetes mellitus with diabetic chronic kidney disease: Secondary | ICD-10-CM | POA: Insufficient documentation

## 2021-01-10 DIAGNOSIS — I2510 Atherosclerotic heart disease of native coronary artery without angina pectoris: Secondary | ICD-10-CM | POA: Diagnosis not present

## 2021-01-10 DIAGNOSIS — Z955 Presence of coronary angioplasty implant and graft: Secondary | ICD-10-CM | POA: Diagnosis not present

## 2021-01-10 NOTE — Progress Notes (Signed)
Primary Care Physician: [name], MD Primary Cardiologist: Dr Aundra Dubin Primary Electrophysiologist: Dr Rayann Heman Referring Physician: Dr Renaldo Fiddler is a 80 y.o. male with a history of CAD, chronic systolic CHF, HTN, HLD, DM, CKD 3, asthma, atrial flutter, and atrial fibrillation who presents for follow up in the [facility].  The patient was admitted to the hospital on 08/10/20 with SOB and found to be in afib with RVR. He was started on amiodarone but there was concern about lung toxicity and this was discontinued. Patient is on Eliquis for a CHADS2VASC score of 6. He underwent afib and atrial flutter ablation with Dr Rayann Heman on 10/26/20.   On follow up today, patient was admitted 12/27/20 with acute CHF, rapid afib, and COVID 19. He underwent DCCV on 12/28/20. He reports that he was back in SR the day after his last AF clinic appointment but reverted back to afib the week before his admission. Since discharge, he has not had any afib noted on his smart watch. He denies any bleeding issues on anticoagulation.   Today, he denies symptoms of palpitations, chest pain, shortness of breath, orthopnea, PND, lower extremity edema, dizziness, presyncope, syncope, snoring, daytime somnolence, bleeding, or neurologic sequela. The patient is tolerating medications without difficulties and is otherwise without complaint today.    Atrial Fibrillation Risk Factors:  he does not have symptoms or diagnosis of sleep apnea. he does not have a history of rheumatic fever.   he has a BMI of Body mass index is 31.15 kg/m.Marland Kitchen Filed Weights   01/10/21 1010  Weight: 110 kg    Family History  Problem Relation Age of Onset   CAD Neg Hx      Atrial Fibrillation Management history:  Previous antiarrhythmic drugs: amiodarone Previous cardioversions: 2019 x 3, 08/11/20, 10/26/20, 12/28/20 Previous ablations: 10/26/20 CHADS2VASC score: 6 Anticoagulation history:  Eliquis   Past Medical History:  Diagnosis Date   Allergy    seasonal   Asthma    mild, intermittent   Atrial fibrillation with rapid ventricular response ([place]) 03/25/2018   CAD, multiple vessel    Three-vessel disease involving mid RCA 85% (PCI with DES), OM 2 80% in branch and ostD1 ~90%. Only the mid RCA with PCI target.    Carotid bruit 08/05/2010   Bruit noted on left    Chronic combined systolic and diastolic CHF (congestive heart failure) (HCC)    CKD (chronic kidney disease), stage III ([place])    Diabetes mellitus    type 2   Fracture of multiple ribs 02/12/2015   Hemothorax on right 01/23/2015   Hyperlipidemia    Hypertension    Hypocalcemia 02/12/2015   Lactic acidosis 03/25/2018   Liver function study, abnormal 04/26/2011   Multiple rib fractures    Obesity    Persistent atrial fibrillation ([place])    Pneumonia    Past Surgical History:  Procedure Laterality Date   APPENDECTOMY  1975   ATRIAL FIBRILLATION ABLATION N/A 10/26/2020   Procedure: ATRIAL FIBRILLATION ABLATION;  Surgeon: Thompson Grayer, MD;  Location: [place] [facility];  Service: Cardiovascular;  Laterality: N/A;   CARDIAC CATHETERIZATION N/A 02/17/2016   Procedure: Left Heart Cath and Coronary Angiography;  Surgeon: Leonie Man, MD;  Location: [place] [facility];  Service: Cardiovascular: mRCA 85% (PCI), branch of OM2 80%,  ostD1 90%; EF 35-45%   CARDIAC CATHETERIZATION N/A 02/17/2016   Procedure: Coronary Stent Intervention;  Surgeon: Leonie Man, MD;  Location: [place] [facility];  Service: Cardiovascular: mRCA PCI : Ronna Polio PREM MR [id number] DES   CARDIOVERSION N/A 03/29/2018   Procedure: CARDIOVERSION;  Surgeon: Larey Dresser, MD;  Location: [facility] ENDOSCOPY;  Service: Cardiovascular;  Laterality: N/A;   CARDIOVERSION N/A 04/02/2018   Procedure: CARDIOVERSION;  Surgeon: Larey Dresser, MD;  Location: [facility] ENDOSCOPY;  Service: Cardiovascular;  Laterality: N/A;   CARDIOVERSION N/A 04/19/2018    Procedure: CARDIOVERSION;  Surgeon: Larey Dresser, MD;  Location: [facility] ENDOSCOPY;  Service: Cardiovascular;  Laterality: N/A;   CARDIOVERSION N/A 08/11/2020   Procedure: CARDIOVERSION;  Surgeon: Larey Dresser, MD;  Location: [facility] ENDOSCOPY;  Service: Cardiovascular;  Laterality: N/A;   CARDIOVERSION N/A 12/28/2020   Procedure: CARDIOVERSION;  Surgeon: Larey Dresser, MD;  Location: [facility] ENDOSCOPY;  Service: Cardiovascular;  Laterality: N/A;   CATARACT EXTRACTION  2010   b/l   CORONARY STENT INTERVENTION N/A 04/03/2018   Procedure: CORONARY STENT INTERVENTION;  Surgeon: Troy Sine, MD;  Location: [place] [facility];  Service: Cardiovascular;  Laterality: N/A;   CORONARY STENT INTERVENTION N/A 03/02/2020   Procedure: CORONARY STENT INTERVENTION;  Surgeon: Jettie Booze, MD;  Location: [place] [facility];  Service: Cardiovascular;  Laterality: N/A;   INTRAVASCULAR ULTRASOUND/IVUS N/A 03/02/2020   Procedure: Intravascular Ultrasound/IVUS;  Surgeon: Jettie Booze, MD;  Location: [place] [facility];  Service: Cardiovascular;  Laterality: N/A;   RETINAL LASER PROCEDURE  1985   RIGHT/LEFT HEART CATH AND CORONARY ANGIOGRAPHY N/A 04/03/2018   Procedure: RIGHT/LEFT HEART CATH AND CORONARY ANGIOGRAPHY;  Surgeon: Larey Dresser, MD;  Location: [place] [facility];  Service: Cardiovascular;  Laterality: N/A;   RIGHT/LEFT HEART CATH AND CORONARY ANGIOGRAPHY N/A 03/02/2020   Procedure: RIGHT/LEFT HEART CATH AND CORONARY ANGIOGRAPHY;  Surgeon: Larey Dresser, MD;  Location: [place] [facility];  Service: Cardiovascular;  Laterality: N/A;   TEE WITHOUT CARDIOVERSION N/A 03/29/2018   Procedure: TRANSESOPHAGEAL ECHOCARDIOGRAM (TEE);  Surgeon: Larey Dresser, MD;  Location: [facility] ENDOSCOPY;  Service: Cardiovascular;  Laterality: N/A;   TEE WITHOUT CARDIOVERSION N/A 04/19/2018   Procedure: TRANSESOPHAGEAL ECHOCARDIOGRAM (TEE);  Surgeon: Larey Dresser, MD;  Location: [facility] ENDOSCOPY;  Service:  Cardiovascular;  Laterality: N/A;   TEE WITHOUT CARDIOVERSION N/A 03/02/2020   Procedure: TRANSESOPHAGEAL ECHOCARDIOGRAM (TEE);  Surgeon: Larey Dresser, MD;  Location: [facility] ENDOSCOPY;  Service: Cardiovascular;  Laterality: N/A;   TEE WITHOUT CARDIOVERSION N/A 10/26/2020   Procedure: TRANSESOPHAGEAL ECHOCARDIOGRAM (TEE);  Surgeon: Acie Fredrickson Wonda Cheng, MD;  Location: [place];  Service: Cardiovascular;  Laterality: N/A;   [place] ECHOCARDIOGRAM  02/2016   Mild LVH. Moderately reduced EF of 35-40% with diffuse hypokinesis. Indeterminate diastolic function. Likely elevated LA pressures. Mild aortic stenosis (mean gradient 10 mmHg). Mild MR. Mild LA dilation. Mild to moderate PA hypertension with 39 mmHg   VIDEO ASSISTED THORACOSCOPY (VATS)/THOROCOTOMY Right 01/24/2015   Procedure: VIDEO ASSISTED THORACOSCOPY (VATS) FOR DRAINAGE OF RIGHT HEMOTHORAX;  Surgeon: Gaye Pollack, MD;  Location: [facility] OR;  Service: Thoracic;  Laterality: Right;    Current Outpatient Medications  Medication Sig Dispense Refill   acetaminophen (TYLENOL) 500 MG tablet Take 1,000 mg by mouth every 6 (six) hours as needed for moderate pain or headache.     ADVAIR DISKUS 100-50 MCG/ACT AEPB INHALE 1 PUFF INTO THE LUNGS TWICE DAILY 60 each 0   albuterol (VENTOLIN HFA) 108 (90 Base) MCG/ACT inhaler Inhale 2 puffs into the lungs every  6 (six) hours as needed for wheezing or shortness of breath. 18 g 1   carvedilol (COREG) 25 MG tablet Take 12.5 mg by mouth 2 (two) times daily with a meal.     cholecalciferol (VITAMIN D) 1000 units tablet Take 1 tablet (1,000 Units total) by mouth daily. 30 tablet 0   ELIQUIS 5 MG TABS tablet TAKE 1 TABLET(5 MG) BY MOUTH TWICE DAILY 60 tablet 11   empagliflozin (JARDIANCE) 25 MG TABS tablet Take 1 tablet (25 mg total) by mouth daily before breakfast. 90 tablet 2   ezetimibe (ZETIA) 10 MG tablet Take 1 tablet (10 mg total) by mouth daily. 90 tablet 3    glucose blood test strip Use as instructed 100 each 12   icosapent Ethyl (VASCEPA) 1 g capsule Take 2 capsules (2 g total) by mouth 2 (two) times daily. 120 capsule 11   insulin lispro (HUMALOG KWIKPEN) 100 UNIT/ML KwikPen Max daily 15 units 15 mL 6   Insulin Pen Needle (PEN NEEDLES) 32G X 4 MM MISC Inject 1 Device into the skin in the morning, at noon, in the evening, and at bedtime. Use 1 at bedtime with Lantus.  Dx code: E11.9 400 each 1   LANTUS SOLOSTAR 100 UNIT/ML Solostar Pen INJECT 10 UNITS INTO THE SKIN AT BEDTIME 15 mL 0   metFORMIN (GLUCOPHAGE) 1000 MG tablet Take 1 tablet (1,000 mg total) by mouth daily with breakfast. 90 tablet 3   nitroGLYCERIN (NITROSTAT) 0.4 MG SL tablet Place 1 tablet (0.4 mg total) under the tongue every 5 (five) minutes as needed for chest pain. 30 tablet 0   polyvinyl alcohol (LIQUIFILM TEARS) 1.4 % ophthalmic solution Place 1 drop into both eyes daily as needed for dry eyes (irritation).     predniSONE (DELTASONE) 5 MG tablet Take by mouth.     ranolazine (RANEXA) 500 MG 12 hr tablet Take 1 tablet (500 mg total) by mouth 2 (two) times daily. 180 tablet 0   rosuvastatin (CRESTOR) 40 MG tablet Take 1 tablet (40 mg total) by mouth daily. 30 tablet 3   sacubitril-valsartan (ENTRESTO) 24-26 MG Take 1 tablet by mouth 2 (two) times daily. 60 tablet 5   sodium polystyrene (KAYEXALATE) 15 GM/60ML suspension Take 60 mLs (15 g total) by mouth daily as needed. 240 mL 0   torsemide (DEMADEX) 20 MG tablet Take 1 tablet (20 mg total) by mouth in the morning. Start taking on 08/22/20 30 tablet 0   pantoprazole (PROTONIX) 40 MG tablet Take 1 tablet (40 mg total) by mouth daily. 45 tablet 0   Current Facility-Administered Medications  Medication Dose Route Frequency Provider Last Rate Last Admin   insulin starter kit- pen needles (English) 1 kit  1 kit Other Once [name], MD        Allergies  Allergen Reactions   Amiodarone Shortness Of Breath    Social History    Socioeconomic History   Marital status: Married    Spouse name: Not on file   Number of children: Not on file   Years of education: Not on file   Highest education level: Not on file  Occupational History   Not on file  Tobacco Use   Smoking status: Never   Smokeless tobacco: Never  Vaping Use   Vaping Use: Never used  Substance and Sexual Activity   Alcohol use: Yes    Comment: rare use now   Drug use: No   Sexual activity: Not on file  Other  Topics Concern   Not on file  Social History Narrative   Not on file   Social Determinants of Health   Financial Resource Strain: Not on file  Food Insecurity: Not on file  Transportation Needs: Not on file  Physical Activity: Not on file  Stress: Not on file  Social Connections: Not on file  Intimate Partner Violence: Not on file     ROS- All systems are reviewed and negative except as per the HPI above.  Physical Exam: Vitals:   01/10/21 1010  BP: 90/62  Pulse: 88  Weight: 110 kg  Height: _0  (1.88 m)   GEN- The patient is a well appearing elderly obese male, alert and oriented x 3 today.   HEENT-head normocephalic, atraumatic, sclera clear, conjunctiva pink, hearing intact, trachea midline. Lungs- Clear to ausculation bilaterally, normal work of breathing Heart- Regular rate and rhythm, no murmurs, rubs or gallops  GI- soft, NT, ND, + BS Extremities- no clubbing, cyanosis, or edema MS- no significant deformity or atrophy Skin- no rash or lesion Psych- euthymic mood, full affect Neuro- strength and sensation are intact   Wt Readings from Last 3 Encounters:  01/10/21 110 kg  12/31/20 110.7 kg  11/30/20 122.5 kg    EKG today demonstrates  SR, PAC Vent. rate 88 BPM PR interval 160 ms QRS duration 92 ms QT/QTcB 400/484 ms  Echo 08/11/20 demonstrated   1. Inferior basal hypokinesis . Left ventricular ejection fraction, by  estimation, is 50 to 55%. The left ventricle has low normal function. The left  ventricle demonstrates regional wall motion abnormalities (see scoring diagram/findings for description). Left ventricular diastolic parameters are indeterminate.   2. Right ventricular systolic function is normal. The right ventricular  size is normal. There is normal pulmonary artery systolic pressure.   3. Left atrial size was moderately dilated.   4. Right atrial size was moderately dilated.   5. Color flow interrogation of MV not very good due to poor image quality MR previously described as moderate on TEE 02/2020 . The mitral valve was not well visualized. Mild mitral valve regurgitation. No evidence of mitral stenosis. Moderate mitral annular calcification.   6. Tricuspid valve regurgitation is moderate.   7. The aortic valve is tricuspid. There is moderate calcification of the  aortic valve. There is moderate thickening of the aortic valve. Aortic  valve regurgitation is not visualized. Mild aortic valve stenosis.   8. Aortic dilatation noted. There is mild dilatation of the aortic root,  measuring 40 mm.   9. The inferior vena cava is normal in size with greater than 50%  respiratory variability, suggesting right atrial pressure of 3 mmHg.   Epic records are reviewed at length today  CHA2DS2-VASc Score = 6  The patient's score is based upon: CHF History: Yes HTN History: Yes Diabetes History: Yes Stroke History: No Vascular Disease History: Yes Age Score: 2 Gender Score: 0     ASSESSMENT AND PLAN: 1. Persistent Atrial Fibrillation/atrial flutter The patient's CHA2DS2-VASc score is 6, indicating a 9.7% annual risk of stroke.   S/p afib ablation 10/26/20. S/p DCCV 12/28/20 Patient appears to be maintaining SR.  Continue Eliquis 5 mg BID with no missed doses for 3 months post ablation.  Continue carvedilol 12.5 mg BID  2. Secondary Hypercoagulable State (ICD10:  D68.69) The patient is at significant risk for stroke/thromboembolism based upon his CHA2DS2-VASc Score of 6.   Continue Apixaban (Eliquis).   3. Obesity Body mass index is 31.15 kg/m.  Lifestyle modification was discussed and encouraged including regular physical activity and weight reduction.  4. Chronic systolic CHF No signs or symptoms of fluid overload today.  5. CAD No anginal symptoms.  6. HTN Stable, no changes today.   Follow up with Dr Aundra Dubin and Dr Rayann Heman as scheduled.    [facility] [address] [place], [place] [address] [phone] 01/10/2021 10:45 AM

## 2021-01-18 ENCOUNTER — Ambulatory Visit: Payer: Medicare Other | Admitting: Internal Medicine

## 2021-01-24 ENCOUNTER — Ambulatory Visit (HOSPITAL_COMMUNITY)
Admission: RE | Admit: 2021-01-24 | Discharge: 2021-01-24 | Disposition: A | Discharge status: Home / Self Care | Payer: Medicare Other | Source: Ambulatory Visit | Attending: Primary Care | Admitting: Primary Care

## 2021-01-24 ENCOUNTER — Other Ambulatory Visit: Payer: Self-pay

## 2021-01-24 DIAGNOSIS — J984 Other disorders of lung: Secondary | ICD-10-CM | POA: Diagnosis not present

## 2021-01-24 DIAGNOSIS — T462X5A Adverse effect of other antidysrhythmic drugs, initial encounter: Secondary | ICD-10-CM | POA: Insufficient documentation

## 2021-01-24 DIAGNOSIS — J479 Bronchiectasis, uncomplicated: Secondary | ICD-10-CM | POA: Diagnosis not present

## 2021-01-24 DIAGNOSIS — J90 Pleural effusion, not elsewhere classified: Secondary | ICD-10-CM | POA: Diagnosis not present

## 2021-01-24 DIAGNOSIS — I700 Atherosclerosis of aorta: Secondary | ICD-10-CM | POA: Diagnosis not present

## 2021-01-27 ENCOUNTER — Other Ambulatory Visit: Payer: Self-pay | Admitting: Medical

## 2021-01-28 ENCOUNTER — Ambulatory Visit (HOSPITAL_COMMUNITY)
Admission: RE | Admit: 2021-01-28 | Discharge: 2021-01-28 | Disposition: A | Discharge status: Home / Self Care | Payer: Medicare Other | Source: Ambulatory Visit | Attending: Cardiology | Admitting: Cardiology

## 2021-01-28 ENCOUNTER — Other Ambulatory Visit: Payer: Self-pay

## 2021-01-28 ENCOUNTER — Encounter (HOSPITAL_COMMUNITY): Payer: Self-pay | Admitting: Cardiology

## 2021-01-28 VITALS — BP 112/74 | HR 73 | Ht 74.0 in | Wt 252.2 lb

## 2021-01-28 DIAGNOSIS — Z794 Long term (current) use of insulin: Secondary | ICD-10-CM | POA: Diagnosis not present

## 2021-01-28 DIAGNOSIS — E785 Hyperlipidemia, unspecified: Secondary | ICD-10-CM | POA: Diagnosis not present

## 2021-01-28 DIAGNOSIS — I2510 Atherosclerotic heart disease of native coronary artery without angina pectoris: Secondary | ICD-10-CM | POA: Diagnosis not present

## 2021-01-28 DIAGNOSIS — I130 Hypertensive heart and chronic kidney disease with heart failure and stage 1 through stage 4 chronic kidney disease, or unspecified chronic kidney disease: Secondary | ICD-10-CM | POA: Insufficient documentation

## 2021-01-28 DIAGNOSIS — Z888 Allergy status to other drugs, medicaments and biological substances status: Secondary | ICD-10-CM | POA: Insufficient documentation

## 2021-01-28 DIAGNOSIS — I5042 Chronic combined systolic (congestive) and diastolic (congestive) heart failure: Secondary | ICD-10-CM | POA: Insufficient documentation

## 2021-01-28 DIAGNOSIS — E1122 Type 2 diabetes mellitus with diabetic chronic kidney disease: Secondary | ICD-10-CM | POA: Insufficient documentation

## 2021-01-28 DIAGNOSIS — Z79899 Other long term (current) drug therapy: Secondary | ICD-10-CM | POA: Diagnosis not present

## 2021-01-28 DIAGNOSIS — Z8616 Personal history of COVID-19: Secondary | ICD-10-CM | POA: Insufficient documentation

## 2021-01-28 DIAGNOSIS — Z955 Presence of coronary angioplasty implant and graft: Secondary | ICD-10-CM | POA: Insufficient documentation

## 2021-01-28 DIAGNOSIS — I255 Ischemic cardiomyopathy: Secondary | ICD-10-CM | POA: Diagnosis not present

## 2021-01-28 DIAGNOSIS — I080 Rheumatic disorders of both mitral and aortic valves: Secondary | ICD-10-CM | POA: Diagnosis not present

## 2021-01-28 DIAGNOSIS — I480 Paroxysmal atrial fibrillation: Secondary | ICD-10-CM | POA: Diagnosis not present

## 2021-01-28 DIAGNOSIS — N183 Chronic kidney disease, stage 3 unspecified: Secondary | ICD-10-CM | POA: Diagnosis not present

## 2021-01-28 DIAGNOSIS — Z7951 Long term (current) use of inhaled steroids: Secondary | ICD-10-CM | POA: Insufficient documentation

## 2021-01-28 DIAGNOSIS — I5032 Chronic diastolic (congestive) heart failure: Secondary | ICD-10-CM

## 2021-01-28 DIAGNOSIS — Z7901 Long term (current) use of anticoagulants: Secondary | ICD-10-CM | POA: Diagnosis not present

## 2021-01-28 LAB — BASIC METABOLIC PANEL
Anion gap: 10 (ref 5–15)
BUN: 15 mg/dL (ref 8–23)
CO2: 24 mmol/L (ref 22–32)
Calcium: 9 mg/dL (ref 8.9–10.3)
Chloride: 104 mmol/L (ref 98–111)
Creatinine, Ser: 1.19 mg/dL (ref 0.61–1.24)
GFR, Estimated: >60 mL/min (ref >60)
Glucose, Bld: 160 mg/dL — ABNORMAL HIGH (ref 70–99)
Potassium: 4 mmol/L (ref 3.5–5.1)
Sodium: 138 mmol/L (ref 135–145)

## 2021-01-28 LAB — CBC
HCT: 43.1 % (ref 39.0–52.0)
Hemoglobin: 13.7 g/dL (ref 13.0–17.0)
MCH: 29.1 pg (ref 26.0–34.0)
MCHC: 31.8 g/dL (ref 30.0–36.0)
MCV: 91.5 fL (ref 80.0–100.0)
Platelets: 394 10*3/uL (ref 150–400)
RBC: 4.71 MIL/uL (ref 4.22–5.81)
RDW: 14.6 % (ref 11.5–15.5)
WBC: 9 10*3/uL (ref 4.0–10.5)
nRBC: 0 % (ref 0.0–0.2)

## 2021-01-28 MED ORDER — SPIRONOLACTONE 25 MG PO TABS: 12.5000 mg | ORAL_TABLET | Freq: Every day | ORAL | 3 refills | Status: DC

## 2021-01-28 NOTE — Patient Instructions (Signed)
Start Spironolactone 12.5 mg (1/2 tab) Daily  Labs done today, your results will be available in MyChart, we will contact you for abnormal readings.  Your physician recommends that you return for lab work in: 1-2 weeks  Your physician recommends that you schedule a follow-up appointment in: 6 weeks  If you have any questions or concerns before your next appointment please send [place] a message through [place] or call our office at [phone].    TO LEAVE A MESSAGE FOR THE NURSE SELECT OPTION 2, PLEASE LEAVE A MESSAGE INCLUDING: YOUR NAME DATE OF BIRTH CALL BACK NUMBER REASON FOR CALL**this is important as we prioritize the call backs  YOU WILL RECEIVE A CALL BACK THE SAME DAY AS LONG AS YOU CALL BEFORE 4:00 PM  At the [facility], you and your health needs are our priority. As part of our continuing mission to provide you with exceptional heart care, we have created designated Provider Care Teams. These Care Teams include your primary Cardiologist (physician) and Advanced Practice Providers (APPs- Physician Assistants and Nurse Practitioners) who all work together to provide you with the care you need, when you need it.   You may see any of the following providers on your designated Care Team at your next follow up: Dr Arvilla Meres Dr [name] Dr Brandon Melnick, NP Robbie Lis, [place] Mikki Santee Karle Plumber, PharmD   Please be sure to bring in all your medications bottles to every appointment.

## 2021-01-29 ENCOUNTER — Other Ambulatory Visit: Payer: Self-pay | Admitting: Medical

## 2021-01-31 ENCOUNTER — Ambulatory Visit (INDEPENDENT_AMBULATORY_CARE_PROVIDER_SITE_OTHER): Payer: Medicare Other | Admitting: Internal Medicine

## 2021-01-31 ENCOUNTER — Other Ambulatory Visit: Payer: Self-pay

## 2021-01-31 VITALS — BP 140/82 | HR 78 | Ht 72.0 in | Wt 250.0 lb

## 2021-01-31 DIAGNOSIS — I43 Cardiomyopathy in diseases classified elsewhere: Secondary | ICD-10-CM | POA: Diagnosis not present

## 2021-01-31 DIAGNOSIS — R000 Tachycardia, unspecified: Secondary | ICD-10-CM

## 2021-01-31 DIAGNOSIS — I5022 Chronic systolic (congestive) heart failure: Secondary | ICD-10-CM

## 2021-01-31 DIAGNOSIS — I4819 Other persistent atrial fibrillation: Secondary | ICD-10-CM

## 2021-01-31 DIAGNOSIS — N183 Chronic kidney disease, stage 3 unspecified: Secondary | ICD-10-CM

## 2021-01-31 DIAGNOSIS — I255 Ischemic cardiomyopathy: Secondary | ICD-10-CM | POA: Diagnosis not present

## 2021-01-31 DIAGNOSIS — I2510 Atherosclerotic heart disease of native coronary artery without angina pectoris: Secondary | ICD-10-CM

## 2021-01-31 DIAGNOSIS — I4892 Unspecified atrial flutter: Secondary | ICD-10-CM

## 2021-01-31 NOTE — Progress Notes (Signed)
PCP: [name], MD Primary Cardiologist: David Huynh Seen is a 79 y.o. male who presents today for routine electrophysiology followup.  Since his recent afib ablation, the patient reports doing very well.  He did have ERAF 12/27/20 in the setting of COVID and required cardioversion.  he denies procedure related complications and is pleased with the results of the procedure.  Today, he denies symptoms of palpitations, chest pain, shortness of breath,  lower extremity edema, dizziness, presyncope, or syncope.  The patient is otherwise without complaint today.   Past Medical History:  Diagnosis Date   Allergy    seasonal   Asthma    mild, intermittent   Atrial fibrillation with rapid ventricular response ([place]) 03/25/2018   CAD, multiple vessel    Three-vessel disease involving mid RCA 85% (PCI with DES), OM 2 80% in branch and ostD1 ~90%. Only the mid RCA with PCI target.    Carotid bruit 08/05/2010   Bruit noted on left    Chronic combined systolic and diastolic CHF (congestive heart failure) (HCC)    CKD (chronic kidney disease), stage III ([place])    Diabetes mellitus    type 2   Fracture of multiple ribs 02/12/2015   Hemothorax on right 01/23/2015   Hyperlipidemia    Hypertension    Hypocalcemia 02/12/2015   Lactic acidosis 03/25/2018   Liver function study, abnormal 04/26/2011   Multiple rib fractures    Obesity    Persistent atrial fibrillation ([place])    Pneumonia    Past Surgical History:  Procedure Laterality Date   APPENDECTOMY  1975   ATRIAL FIBRILLATION ABLATION N/A 10/26/2020   Procedure: ATRIAL FIBRILLATION ABLATION;  Surgeon: Thompson Grayer, MD;  Location: [place] [facility];  Service: Cardiovascular;  Laterality: N/A;   CARDIAC CATHETERIZATION N/A 02/17/2016   Procedure: Left Heart Cath and Coronary Angiography;  Surgeon: Leonie Man, MD;  Location: [place] [facility];  Service: Cardiovascular: mRCA 85% (PCI), branch of OM2 80%,  ostD1 90%; EF 35-45%    CARDIAC CATHETERIZATION N/A 02/17/2016   Procedure: Coronary Stent Intervention;  Surgeon: Leonie Man, MD;  Location: [place] [facility];  Service: Cardiovascular: mRCA PCI : Ronna Polio PREM MR 2.9N98 DES   CARDIOVERSION N/A 03/29/2018   Procedure: CARDIOVERSION;  Surgeon: Larey Dresser, MD;  Location: [facility] ENDOSCOPY;  Service: Cardiovascular;  Laterality: N/A;   CARDIOVERSION N/A 04/02/2018   Procedure: CARDIOVERSION;  Surgeon: Larey Dresser, MD;  Location: [facility] ENDOSCOPY;  Service: Cardiovascular;  Laterality: N/A;   CARDIOVERSION N/A 04/19/2018   Procedure: CARDIOVERSION;  Surgeon: Larey Dresser, MD;  Location: [facility] ENDOSCOPY;  Service: Cardiovascular;  Laterality: N/A;   CARDIOVERSION N/A 08/11/2020   Procedure: CARDIOVERSION;  Surgeon: Larey Dresser, MD;  Location: [facility] ENDOSCOPY;  Service: Cardiovascular;  Laterality: N/A;   CARDIOVERSION N/A 12/28/2020   Procedure: CARDIOVERSION;  Surgeon: Larey Dresser, MD;  Location: [facility] ENDOSCOPY;  Service: Cardiovascular;  Laterality: N/A;   CATARACT EXTRACTION  2010   b/l   CORONARY STENT INTERVENTION N/A 04/03/2018   Procedure: CORONARY STENT INTERVENTION;  Surgeon: Troy Sine, MD;  Location: [place] [facility];  Service: Cardiovascular;  Laterality: N/A;   CORONARY STENT INTERVENTION N/A 03/02/2020   Procedure: CORONARY STENT INTERVENTION;  Surgeon: Jettie Booze, MD;  Location: [place] [facility];  Service: Cardiovascular;  Laterality: N/A;   INTRAVASCULAR ULTRASOUND/IVUS N/A 03/02/2020   Procedure: Intravascular Ultrasound/IVUS;  Surgeon: Jettie Booze, MD;  Location: [place] [facility];  Service: Cardiovascular;  Laterality: N/A;   RETINAL LASER PROCEDURE  1985   RIGHT/LEFT HEART CATH AND CORONARY ANGIOGRAPHY N/A 04/03/2018   Procedure: RIGHT/LEFT HEART CATH AND CORONARY ANGIOGRAPHY;  Surgeon: Larey Dresser, MD;  Location: [place] [facility];  Service: Cardiovascular;  Laterality: N/A;   RIGHT/LEFT HEART CATH AND  CORONARY ANGIOGRAPHY N/A 03/02/2020   Procedure: RIGHT/LEFT HEART CATH AND CORONARY ANGIOGRAPHY;  Surgeon: Larey Dresser, MD;  Location: [place] [facility];  Service: Cardiovascular;  Laterality: N/A;   TEE WITHOUT CARDIOVERSION N/A 03/29/2018   Procedure: TRANSESOPHAGEAL ECHOCARDIOGRAM (TEE);  Surgeon: Larey Dresser, MD;  Location: [facility] ENDOSCOPY;  Service: Cardiovascular;  Laterality: N/A;   TEE WITHOUT CARDIOVERSION N/A 04/19/2018   Procedure: TRANSESOPHAGEAL ECHOCARDIOGRAM (TEE);  Surgeon: Larey Dresser, MD;  Location: [facility] ENDOSCOPY;  Service: Cardiovascular;  Laterality: N/A;   TEE WITHOUT CARDIOVERSION N/A 03/02/2020   Procedure: TRANSESOPHAGEAL ECHOCARDIOGRAM (TEE);  Surgeon: Larey Dresser, MD;  Location: [facility] ENDOSCOPY;  Service: Cardiovascular;  Laterality: N/A;   TEE WITHOUT CARDIOVERSION N/A 10/26/2020   Procedure: TRANSESOPHAGEAL ECHOCARDIOGRAM (TEE);  Surgeon: Acie Fredrickson Wonda Cheng, MD;  Location: [place];  Service: Cardiovascular;  Laterality: N/A;   [place] ECHOCARDIOGRAM  02/2016   Mild LVH. Moderately reduced EF of 35-40% with diffuse hypokinesis. Indeterminate diastolic function. Likely elevated LA pressures. Mild aortic stenosis (mean gradient 10 mmHg). Mild MR. Mild LA dilation. Mild to moderate PA hypertension with 39 mmHg   VIDEO ASSISTED THORACOSCOPY (VATS)/THOROCOTOMY Right 01/24/2015   Procedure: VIDEO ASSISTED THORACOSCOPY (VATS) FOR DRAINAGE OF RIGHT HEMOTHORAX;  Surgeon: Gaye Pollack, MD;  Location: [place] OR;  Service: Thoracic;  Laterality: Right;    ROS- all systems are personally reviewed and negatives except as per HPI above  Current Outpatient Medications  Medication Sig Dispense Refill   acetaminophen (TYLENOL) 500 MG tablet Take 1,000 mg by mouth every 6 (six) hours as needed for moderate pain or headache.     albuterol (VENTOLIN HFA) 108 (90 Base) MCG/ACT inhaler Inhale 2 puffs into the lungs every 6 (six)  hours as needed for wheezing or shortness of breath. 18 g 1   carvedilol (COREG) 25 MG tablet Take 12.5 mg by mouth 2 (two) times daily with a meal.     cholecalciferol (VITAMIN D) 1000 units tablet Take 1 tablet (1,000 Units total) by mouth daily. 30 tablet 0   ELIQUIS 5 MG TABS tablet TAKE 1 TABLET(5 MG) BY MOUTH TWICE DAILY 60 tablet 11   empagliflozin (JARDIANCE) 25 MG TABS tablet Take 1 tablet (25 mg total) by mouth daily before breakfast. 90 tablet 2   ezetimibe (ZETIA) 10 MG tablet Take 1 tablet (10 mg total) by mouth daily. 90 tablet 3   fluticasone-salmeterol (ADVAIR DISKUS) 100-50 MCG/ACT AEPB INHALE 1 PUFF INTO THE LUNGS TWICE DAILY 60 each 5   glucose blood test strip Use as instructed 100 each 12   icosapent Ethyl (VASCEPA) 1 g capsule Take 2 capsules (2 g total) by mouth 2 (two) times daily. 120 capsule 11   insulin lispro (HUMALOG KWIKPEN) 100 UNIT/ML KwikPen Max daily 15 units 15 mL 6   Insulin Pen Needle (PEN NEEDLES) 32G X 4 MM MISC Inject 1 Device into the skin in the morning, at noon, in the evening, and at bedtime. Use 1 at bedtime with Lantus.  Dx code: E11.9 400 each 1   LANTUS SOLOSTAR 100 UNIT/ML Solostar Pen INJECT  10 UNITS INTO THE SKIN AT BEDTIME 15 mL 0   metFORMIN (GLUCOPHAGE) 1000 MG tablet Take 1 tablet (1,000 mg total) by mouth daily with breakfast. 90 tablet 3   nitroGLYCERIN (NITROSTAT) 0.4 MG SL tablet Place 1 tablet (0.4 mg total) under the tongue every 5 (five) minutes as needed for chest pain. 30 tablet 0   polyvinyl alcohol (LIQUIFILM TEARS) 1.4 % ophthalmic solution Place 1 drop into both eyes daily as needed for dry eyes (irritation).     ranolazine (RANEXA) 500 MG 12 hr tablet Take 1 tablet (500 mg total) by mouth 2 (two) times daily. 180 tablet 0   rosuvastatin (CRESTOR) 40 MG tablet Take 1 tablet (40 mg total) by mouth daily. 30 tablet 3   sacubitril-valsartan (ENTRESTO) 24-26 MG Take 1 tablet by mouth 2 (two) times daily. 60 tablet 5   sodium  polystyrene (KAYEXALATE) 15 GM/60ML suspension Take 60 mLs (15 g total) by mouth daily as needed. 240 mL 0   spironolactone (ALDACTONE) 25 MG tablet Take 0.5 tablets (12.5 mg total) by mouth daily. 15 tablet 3   torsemide (DEMADEX) 20 MG tablet Take 1 tablet (20 mg total) by mouth in the morning. Start taking on 08/22/20 30 tablet 0   Current Facility-Administered Medications  Medication Dose Route Frequency Provider Last Rate Last Admin   insulin starter kit- pen needles (English) 1 kit  1 kit Other Once [name], MD        Physical Exam: Vitals:   01/31/21 1442  BP: 140/82  Pulse: 78  SpO2: 92%  Weight: 250 lb (113.4 kg)  Height: 6' (1.829 m)    GEN- The patient is well appearing, alert and oriented x 3 today.   Head- normocephalic, atraumatic Eyes-  Sclera clear, conjunctiva pink Ears- hearing intact Oropharynx- clear Lungs- Clear to ausculation bilaterally, normal work of breathing Heart- Regular rate and rhythm, no murmurs, rubs or gallops, PMI not laterally displaced GI- soft, NT, ND, + BS Extremities- no clubbing, cyanosis, or edema  EKG tracing ordered today is personally reviewed and shows sinus  Assessment and Plan:  1. Persistent atrial fibrillation/ atrial flutter Doing well s/p ablation off AAD therapy chads2vasc score is 6.  He is on eliquis No changes today  2. Chronic systolic dysfunction Possibly tachycardia mediated Repeat echo 12/28/20 revealed some improvement in EF (now 45-50%).  Hopefully with time, this will continue to improve.  3. CAD No ischemic symptoms No changes  4. CRI Stable No change required today  Return to AF clinic in 3 months  Thompson Grayer MD, [facility] 01/31/2021 2:52 PM

## 2021-01-31 NOTE — Patient Instructions (Addendum)
Medication Instructions:  Your physician recommends that you continue on your current medications as directed. Please refer to the Current Medication list given to you today.  Labwork: None ordered.  Testing/Procedures: None ordered.  Follow-Up: Your physician wants you to follow-up in: 3 months with the Afib Clinic. They will contact you to schedule.    Any Other Special Instructions Will Be Listed Below (If Applicable).  If you need a refill on your cardiac medications before your next appointment, please call your pharmacy.         

## 2021-01-31 NOTE — Progress Notes (Signed)
Advanced Heart Failure Clinic Note   PCP: Bradd Canary, MD  Cardiologist: [name], MD   David Huynh is a 80 y.o. male with a history of CAD, chronic systolic CHF, HTN, HLD, DM, CKD 3, and asthma. Patient had EF 35-40% in 9/17, had cath with DES to RCA then repeat echo with EF up to 55-60% by 3/18.   Admitted 03/2018 with SOB x 3 weeks. No cardiology follow up in >1 year. Found to be in atrial fibrillation with RVR. EF down to 35-40%. Poor diuresis with IV lasix, so HF team consulted. He was started on amiodarone drip. PICC line placed with CVP markedly elevated and Coox marginal. Held off on inotropes with concern for making afib worse. Started on lasix drip. He started eliquis and had TEE/DCCV 03/29/18. Was converted to NSR, but shortly after went back into AF. Ranexa added. Attempted repeat DCCV 04/02/18, but was unsuccessful. EP consulted and recommended repeat LHC prior to repeat DCCV. Had [facility] 04/03/18 with PCI to proximal LCx.  He then had TEE-DCCV on 04/19/18 back to NSR. EF on TEE was 40%.  He saw Dr. Johney Frame to discuss ablation and opted to continue amiodarone.   Echo in 3/20 showed EF 50% with diffuse hypokinesis, normal RV, mild AS and mild MR.    He was lost to followup in this office again for over a year.  In 7/21, he was admitted for a couple of days with CHF exacerbation and was treated with IV Lasix.  Echo was done in 7/21, EF back down to 25-30%, global hypokinesis, severely decreased RV systolic function with severe RV enlargement, at least moderate aortic stenosis.  He was in NSR.    TEE was done in 10/21 for closer evaluation of aortic valve, this showed EF actually up to 50% with moderate AS and moderate MR.  LHC/RHC was done, showing normal filling pressures and significant LAD disease.  The patient is now s/p DES to LAD.   He was admitted on [facility] on 08/10/20 with atrial fibrillation/RVR and CHF.  Amiodarone was started and he was cardioverted to NSR.  Echo in  3/22 showed EF 50-55% with normal RV and mild AS, mild MR.  He developed acute amiodarone lung toxicity with elevated ESR and patchy bilateral infiltrates.  Amiodarone was stopped and prednisone was started.  Oxygen saturation gradually increased over time and he was eventually able to wean off oxygen.   In 6/22, he had atrial fibrillation ablation.   In 8/22, he was admitted with COVID-19 PNA and atrial fibrillation/RVR.  He had a cardioversion back to NSR.  Echo in 8/22 showed EF 45-50%, moderate LVH, RV normal, mild MR, moderate AS.   CT chest in 9/22 showed fibrosis pattern still present but improved.   He returns today for followup of CHF and CAD.  He is in NSR today.  Weight is down.  He is using a walker for balance but denies dyspnea walking on flat ground.  Stamina poor.  No chest pain.  No lightheadedness.  To start going to gym with a trainer.  He is now off oxygen and has completed prednisone course for amiodarone lung toxicity.   ECG (personally reviewed): NSR, normal  Labs (12/19): K 4.2, creatinine 1.35, LFTs normal Las (7/21): LDL 198, K 3.7, creatinine 1.75 Labs (8/21): K 4, creatinine 1.48, BNP 593 Labs (10/21): K 3.9, creatinine 1.28, LDL 230 (not taking statin), TSH normal, LFTs normal Labs (11/21): K 3.8, creatinine 1.34 Labs (  4/22): K 6, creatinine 2.41 Labs (5/22): hgb 12.8, K 4, creatinine 1.52 Labs (8/22): hgb 12.2, K 4.2, creatinine 1.44  SH: No tobacco or drug use. Rare ETOH use. Lives at home with his wife in [place]. 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  FH: No family hx of CAD or CHF.   Review of systems complete and found to be negative unless listed in HPI.   PMH: 1. CKD: Stage 3.  2. Atrial fibrillation: Paroxysmal => persistent, poorly tolerated with history of tachycardia-mediated CMP.   - DCCV x 2 in 11/19 and once in 12/19.  - DCCV to NSR in 3/22 on amiodarone, developed amiodarone lung toxicity and amiodarone stopped.  Went back into atrial fibrillation.   - Atrial fibrillation ablation 6/22.  - DCCV to NSR 8/22.  3. Type II diabetes.  4. HTN 5. Hyperlipidemia.  6. CAD: DES to mid RCA in 2017.   - LHC (11/19): 50% mLAD, 50% dLAD, 80-85% proximal LCx stenosis, OM1 small vessel with 90% stenois. PCI to proximal LCx.  - LHC (10/21): 80% pLAD, 70% p/mLAD, 50% m/d LAD, 80% superior branch OM2, 60% inferior branch OM2, 50% mPDA. PCI with DES to LAD.  7. Chronic systolic CHF: Suspect this was primarily tachy-cardia mediated.  - Echo (11/19): EF 35-40%, moderate to severely decreased RV systolic function.  - TEE (12/19): EF 40%, diffuse hypokinesis, normal RV size and systolic function.  - Echo (3/20): EF 50%, normal RV, mild MR, mild AS with mean gradient 14 mmHg. - Echo (7/21): EF 25-30%, global hypokinesis, severely decreased RV systolic function with severe RV enlargement, at least moderate aortic stenosis.   - TEE (10/21): EF 50%, anterior hypokinesis, basal inferior hypokinesis, normal RV, moderate AS with mean gradient 22 mmHg and AVA 1.18 cm^2, moderate MR with ERO 0.32 cm^2.  - Echo (3/22): EF 50-55% with normal RV and mild AS, mild MR - RHC (10/21): mean RA 2, PA 29/4, mean PCWP 6, CI 2.66, easily crossed AoV with mean gradient 15 mmHg.  - Echo (8/22): EF 45-50%, moderate LVH, RV normal, mild MR, moderate AS.  8. Aortic stenosis: Mild by 3/20 echo. 7/21 echo with at least moderate AS.  - Moderate on 8/22 echo.  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  - CT chest (9/22): Fibrosis present but improved.  10. COVID-19 PNA (8/22)   Current Outpatient Medications  Medication Sig Dispense Refill   acetaminophen (TYLENOL) 500 MG tablet Take 1,000 mg by mouth every 6 (six) hours as needed for moderate pain or headache.     ADVAIR DISKUS 100-50 MCG/ACT AEPB INHALE 1 PUFF INTO THE LUNGS TWICE DAILY 60 each 0   albuterol (VENTOLIN HFA) 108 (90 Base) MCG/ACT inhaler Inhale 2 puffs into the lungs every 6 (six) hours as needed for  wheezing or shortness of breath. 18 g 1   carvedilol (COREG) 25 MG tablet Take 12.5 mg by mouth 2 (two) times daily with a meal.     cholecalciferol (VITAMIN D) 1000 units tablet Take 1 tablet (1,000 Units total) by mouth daily. 30 tablet 0   ELIQUIS 5 MG TABS tablet TAKE 1 TABLET(5 MG) BY MOUTH TWICE DAILY 60 tablet 11   empagliflozin (JARDIANCE) 25 MG TABS tablet Take 1 tablet (25 mg total) by mouth daily before breakfast. 90 tablet 2   ezetimibe (ZETIA) 10 MG tablet Take 1 tablet (10 mg total) by mouth daily. 90 tablet 3   glucose blood test strip Use as instructed 100 each  12   icosapent Ethyl (VASCEPA) 1 g capsule Take 2 capsules (2 g total) by mouth 2 (two) times daily. 120 capsule 11   insulin lispro (HUMALOG KWIKPEN) 100 UNIT/ML KwikPen Max daily 15 units 15 mL 6   Insulin Pen Needle (PEN NEEDLES) 32G X 4 MM MISC Inject 1 Device into the skin in the morning, at noon, in the evening, and at bedtime. Use 1 at bedtime with Lantus.  Dx code: E11.9 400 each 1   LANTUS SOLOSTAR 100 UNIT/ML Solostar Pen INJECT 10 UNITS INTO THE SKIN AT BEDTIME 15 mL 0   metFORMIN (GLUCOPHAGE) 1000 MG tablet Take 1 tablet (1,000 mg total) by mouth daily with breakfast. 90 tablet 3   nitroGLYCERIN (NITROSTAT) 0.4 MG SL tablet Place 1 tablet (0.4 mg total) under the tongue every 5 (five) minutes as needed for chest pain. 30 tablet 0   polyvinyl alcohol (LIQUIFILM TEARS) 1.4 % ophthalmic solution Place 1 drop into both eyes daily as needed for dry eyes (irritation).     ranolazine (RANEXA) 500 MG 12 hr tablet Take 1 tablet (500 mg total) by mouth 2 (two) times daily. 180 tablet 0   rosuvastatin (CRESTOR) 40 MG tablet Take 1 tablet (40 mg total) by mouth daily. 30 tablet 3   sacubitril-valsartan (ENTRESTO) 24-26 MG Take 1 tablet by mouth 2 (two) times daily. 60 tablet 5   sodium polystyrene (KAYEXALATE) 15 GM/60ML suspension Take 60 mLs (15 g total) by mouth daily as needed. 240 mL 0   spironolactone (ALDACTONE) 25 MG  tablet Take 0.5 tablets (12.5 mg total) by mouth daily. 15 tablet 3   torsemide (DEMADEX) 20 MG tablet Take 1 tablet (20 mg total) by mouth in the morning. Start taking on 08/22/20 30 tablet 0   Current Facility-Administered Medications  Medication Dose Route Frequency Provider Last Rate Last Admin   insulin starter kit- pen needles (English) 1 kit  1 kit Other Once [name], MD        Allergies  Allergen Reactions   Amiodarone Shortness Of Breath     Vitals:   01/28/21 1403  BP: 112/74  Pulse: 73  SpO2: 95%  Weight: 114.4 kg (252 lb 3.2 oz)  Height: $Remove'6\' 2"'[id number]$  (1.88 m)   Wt Readings from Last 3 Encounters:  01/28/21 114.4 kg (252 lb 3.2 oz)  01/10/21 110 kg (242 lb 9.6 oz)  12/31/20 110.7 kg (244 lb 0.8 oz)   PHYSICAL EXAM: General: NAD Neck: No JVD, no thyromegaly or thyroid nodule.  Lungs: Clear to auscultation bilaterally with normal respiratory effort. CV: Nondisplaced PMI.  Heart regular S1/S2, no S3/S4, no murmur.  No peripheral edema.  No carotid bruit.  Normal pedal pulses.  Abdomen: Soft, nontender, no hepatosplenomegaly, no distention.  Skin: Intact without lesions or rashes.  Neurologic: Alert and oriented x 3.  Psych: Normal affect. Extremities: No clubbing or cyanosis.  HEENT: Normal.   ASSESSMENT & PLAN:  1. Chronic systolic=>diastolic CHF: Prior ischemic cardiomyopathy, improved after PCI in 2017.  Echo 03/2018 with EF back down to 35-40% and moderate-severe RV dysfunction. This was in the setting of atrial fibrillation with RVR. EF 40% on TEE 04/2018.  Echo was in 3/20, showing EF up to 50%.  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  Echo in 7/21 with EF 25-30%, severe RV dysfunction.  TEE in 10/21, however, showed EF up to 50%.  He had PCI to LAD in 10/21. Echo in 3/22  showed stable EF 50-55% with normal RV.   Echo in 8/22 with EF 45-50%, moderate LVH, normal RV, moderate AS.  On exam, he is not volume overloaded.  -  Continue torsemide 20 mg daily.  BMET today.  - Continue Entresto 24/26 bid.   - Continue Coreg 12.5 mg bid.  - Add spironolactone 12.5 daily with BMET in 10 days. - Continue empagliflozin 10 mg daily.   2. Atrial fibrillation: As above, concerned for possible tachy-mediated CMP.  Had successful DC-CV on 03/29/18 but then back in atrial fibrillation. Ranolazine added, but failed DCCV again 04/02/18. DCCV 04/19/18 was successful.  AF with RVR at 3/22 admission, amiodarone restarted and he was cardioverted to NSR, but he developed amiodarone lung toxicity.  Amiodarone was stopped. Now s/p atrial fibrillation ablation in 6/22.  He has been in NSR recently.  - Currently on ranolazine 500 mg BID, QTc acceptable on today's ECG.  - No amiodarone with history of lung toxicity.   - Continue Eliquis 5 mg BID.   3. CKD: Stage 3.  Creatinine 1.4 today, stable.   - Has appointment with nephrology.  - Continue empagliflozin.  4. CAD: History of DES to RCA in 9/17.  Underwent LHC 04/03/18 with PCI to prox left circ. LHC in 10/21 with PCI to LAD.   - He is on Eliquis so no ASA.   - Continue statin.  5. Aortic stenosis: Moderate on 8/22 echo.  6. Mitral regurgitation: Mild on 8/22 echo.  7. 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  He improved with steroids.  Now off oxygen and steroids.    Followup 6 wks with APP.  Loralie Champagne, MD 01/31/21

## 2021-02-01 NOTE — Progress Notes (Signed)
Also showed liver cirrhosis, LFTs in the past were abnormal. He should follow up with PCP- may want to see GI.

## 2021-02-01 NOTE — Progress Notes (Signed)
Please let patient know CT chest showed overall improvement of lung fibrosis compared to April. Additional findings coronary artery calcifications, atherosclerosis, tiny pleural effusion, enlarged pulmonic trunk and heart. Continue to follow with cardiology, he saw them yesteday.

## 2021-02-02 ENCOUNTER — Telehealth: Payer: Self-pay | Admitting: Primary Care

## 2021-02-02 NOTE — Telephone Encounter (Signed)
Also showed liver cirrhosis, LFTs in the past were abnormal. He should follow up with PCP- may want to see GI.   David Bayley, NP  02/01/2021 10:55 AM EDT     Please let patient know CT chest showed overall improvement of lung fibrosis compared to April. Additional findings coronary artery calcifications, atherosclerosis, tiny pleural effusion, enlarged pulmonic trunk and heart. Continue to follow with cardiology, he saw them yesteday.    I have spoken with pt and he is aware of results.

## 2021-02-08 ENCOUNTER — Ambulatory Visit: Payer: Medicare Other | Admitting: Family Medicine

## 2021-02-08 ENCOUNTER — Ambulatory Visit (HOSPITAL_COMMUNITY)
Admission: RE | Admit: 2021-02-08 | Discharge: 2021-02-08 | Disposition: A | Discharge status: Home / Self Care | Payer: Medicare Other | Source: Ambulatory Visit | Attending: Internal Medicine | Admitting: Internal Medicine

## 2021-02-08 ENCOUNTER — Other Ambulatory Visit: Payer: Self-pay

## 2021-02-08 DIAGNOSIS — I5032 Chronic diastolic (congestive) heart failure: Secondary | ICD-10-CM | POA: Diagnosis not present

## 2021-02-08 LAB — BASIC METABOLIC PANEL
Anion gap: 11 (ref 5–15)
BUN: 20 mg/dL (ref 8–23)
CO2: 22 mmol/L (ref 22–32)
Calcium: 9.2 mg/dL (ref 8.9–10.3)
Chloride: 104 mmol/L (ref 98–111)
Creatinine, Ser: 1.62 mg/dL — ABNORMAL HIGH (ref 0.61–1.24)
GFR, Estimated: 43 mL/min — ABNORMAL LOW (ref >60)
Glucose, Bld: 214 mg/dL — ABNORMAL HIGH (ref 70–99)
Potassium: 4.1 mmol/L (ref 3.5–5.1)
Sodium: 137 mmol/L (ref 135–145)

## 2021-02-15 ENCOUNTER — Ambulatory Visit: Payer: Medicare Other | Admitting: Internal Medicine

## 2021-03-01 ENCOUNTER — Ambulatory Visit: Payer: Medicare Other | Admitting: Internal Medicine

## 2021-03-03 ENCOUNTER — Ambulatory Visit: Payer: Medicare Other | Admitting: Internal Medicine

## 2021-03-07 ENCOUNTER — Ambulatory Visit: Payer: Medicare Other | Admitting: Family Medicine

## 2021-03-08 DIAGNOSIS — Z23 Encounter for immunization: Secondary | ICD-10-CM | POA: Diagnosis not present

## 2021-03-10 ENCOUNTER — Encounter (HOSPITAL_COMMUNITY): Payer: Self-pay | Admitting: Cardiology

## 2021-03-11 ENCOUNTER — Encounter (HOSPITAL_COMMUNITY): Payer: Medicare Other

## 2021-03-15 ENCOUNTER — Other Ambulatory Visit: Payer: Self-pay

## 2021-03-15 ENCOUNTER — Ambulatory Visit (INDEPENDENT_AMBULATORY_CARE_PROVIDER_SITE_OTHER): Payer: Medicare Other | Admitting: Internal Medicine

## 2021-03-15 VITALS — BP 140/80 | HR 94 | Ht 72.0 in | Wt 254.0 lb

## 2021-03-15 DIAGNOSIS — E1142 Type 2 diabetes mellitus with diabetic polyneuropathy: Secondary | ICD-10-CM

## 2021-03-15 DIAGNOSIS — E1159 Type 2 diabetes mellitus with other circulatory complications: Secondary | ICD-10-CM

## 2021-03-15 DIAGNOSIS — E1122 Type 2 diabetes mellitus with diabetic chronic kidney disease: Secondary | ICD-10-CM | POA: Diagnosis not present

## 2021-03-15 DIAGNOSIS — N1832 Chronic kidney disease, stage 3b: Secondary | ICD-10-CM | POA: Diagnosis not present

## 2021-03-15 LAB — POCT GLYCOSYLATED HEMOGLOBIN (HGB A1C): Hemoglobin A1C: 8.6 % — AB (ref 4.0–5.6)

## 2021-03-15 MED ORDER — LANTUS SOLOSTAR 100 UNIT/ML ~~LOC~~ SOPN: 12.0000 [IU] | PEN_INJECTOR | Freq: Every day | SUBCUTANEOUS | 3 refills | Status: DC

## 2021-03-15 NOTE — Patient Instructions (Addendum)
-   Continue Metformin 1000 , 1 tablet daily  - Continue Jardiance 25 mg for now  - INcrease  Lantus 12 units daily  - Humalog correctional insulin: Use the scale below to help guide you with each meal   Blood sugar before meal Number of units to inject  Less than 165 0 unit  166 -  200 1 units  201 -  235 2 units  236 -  270 3 units  271 -  305 4 units  306 - 340 5 units       HOW TO TREAT LOW BLOOD SUGARS (Blood sugar LESS THAN 70 MG/DL) Please follow the RULE OF 15 for the treatment of hypoglycemia treatment (when your (blood sugars are less than 70 mg/dL)   STEP 1: Take 15 grams of carbohydrates when your blood sugar is low, which includes:  3-4 GLUCOSE TABS  OR 3-4 OZ OF JUICE OR REGULAR SODA OR ONE TUBE OF GLUCOSE GEL    STEP 2: RECHECK blood sugar in 15 MINUTES STEP 3: If your blood sugar is still low at the 15 minute recheck --> then, go back to STEP 1 and treat AGAIN with another 15 grams of carbohydrates.

## 2021-03-15 NOTE — Progress Notes (Signed)
Name: David Huynh  Age/ Sex: 80 y.o., male   MRN/ DOB: [id number], March 29, 1941     PCP: Bradd Canary, MD   Reason for Endocrinology Evaluation: Type 2 Diabetes Mellitus  Initial Endocrine Consultative Visit: 02/24/2020    PATIENT IDENTIFIER: David Huynh is a 80 y.o. male with a past medical history of T2DM, A. Fib, HTN and dyslipidemia. The patient has followed with Endocrinology clinic since 02/24/2020 for consultative assistance with management of his diabetes.  DIABETIC HISTORY:  David Huynh was diagnosed with T2DM many years ago. He has not been on insulin in the past. His hemoglobin A1c has ranged from 7.3% in 2012, peaking at 9.9% in 2016.   SUBJECTIVE:   During the last visit (11/30/2020): A1c 10.0%. Stopped Glipizide, continued  Metformin, lantus, humalog and jardiance   Today (03/15/2021): David Huynh is here for a follow up on diabetes care.  He checks his blood sugars 1 times daily. The patient has not had hypoglycemic episodes since the last clinic visit.   He follows with pulm. Due to amiodarone pulmonary  toxicity , completed prednisone 3 weeks ago  Follows with cardiology for A. Fib and CAD, last follow up  01/31/2021     HOME DIABETES REGIMEN:  Metformin 1000 , 1 tablet daily  Jardiance 25 mg daily  Lantus 10 units daily  Humalog (BG -130/45)    Statin: yes ACE-I/ARB: yes    GLUCOSE LOG: AM 131 -170 Lunch 110- 236  Supper 137- 236  Bedtime 142- 219     DIABETIC COMPLICATIONS: Microvascular complications:  CKD III Denies: retinopathy, neuropathy  Last Eye Exam: Completed 03/2019  Macrovascular complications:  CAD ( S/P stent ) /CHF Denies:  CVA, PVD   HISTORY:  Past Medical History:  Past Medical History:  Diagnosis Date   Allergy    seasonal   Asthma    mild, intermittent   Atrial fibrillation with rapid ventricular response (HCC) 03/25/2018   CAD, multiple vessel    Three-vessel disease involving mid RCA 85% (PCI  with DES), OM 2 80% in branch and ostD1 ~90%. Only the mid RCA with PCI target.    Carotid bruit 08/05/2010   Bruit noted on left    Chronic combined systolic and diastolic CHF (congestive heart failure) (HCC)    CKD (chronic kidney disease), stage III (HCC)    Diabetes mellitus    type 2   Fracture of multiple ribs 02/12/2015   Hemothorax on right 01/23/2015   Hyperlipidemia    Hypertension    Hypocalcemia 02/12/2015   Lactic acidosis 03/25/2018   Liver function study, abnormal 04/26/2011   Multiple rib fractures    Obesity    Persistent atrial fibrillation (HCC)    Pneumonia    Past Surgical History:  Past Surgical History:  Procedure Laterality Date   APPENDECTOMY  1975   ATRIAL FIBRILLATION ABLATION N/A 10/26/2020   Procedure: ATRIAL FIBRILLATION ABLATION;  Surgeon: Hillis Range, MD;  Location: [facility] INVASIVE CV LAB;  Service: Cardiovascular;  Laterality: N/A;   CARDIAC CATHETERIZATION N/A 02/17/2016   Procedure: Left Heart Cath and Coronary Angiography;  Surgeon: Marykay Lex, MD;  Location: [facility] INVASIVE CV LAB;  Service: Cardiovascular: mRCA 85% (PCI), branch of OM2 80%,  ostD1 90%; EF 35-45%   CARDIAC CATHETERIZATION N/A 02/17/2016   Procedure: Coronary Stent Intervention;  Surgeon: Marykay Lex, MD;  Location: [facility] INVASIVE CV LAB;  Service: Cardiovascular: mRCA PCI : STENT PROMUS PREM MR 3.8U82 DES  CARDIOVERSION N/A 03/29/2018   Procedure: CARDIOVERSION;  Surgeon: Larey Dresser, MD;  Location: [facility] ENDOSCOPY;  Service: Cardiovascular;  Laterality: N/A;   CARDIOVERSION N/A 04/02/2018   Procedure: CARDIOVERSION;  Surgeon: Larey Dresser, MD;  Location: [facility] ENDOSCOPY;  Service: Cardiovascular;  Laterality: N/A;   CARDIOVERSION N/A 04/19/2018   Procedure: CARDIOVERSION;  Surgeon: Larey Dresser, MD;  Location: [facility] ENDOSCOPY;  Service: Cardiovascular;  Laterality: N/A;   CARDIOVERSION N/A 08/11/2020   Procedure: CARDIOVERSION;  Surgeon: Larey Dresser, MD;  Location: [facility]  ENDOSCOPY;  Service: Cardiovascular;  Laterality: N/A;   CARDIOVERSION N/A 12/28/2020   Procedure: CARDIOVERSION;  Surgeon: Larey Dresser, MD;  Location: [facility] ENDOSCOPY;  Service: Cardiovascular;  Laterality: N/A;   CATARACT EXTRACTION  2010   b/l   CORONARY STENT INTERVENTION N/A 04/03/2018   Procedure: CORONARY STENT INTERVENTION;  Surgeon: Troy Sine, MD;  Location: [place] CV LAB;  Service: Cardiovascular;  Laterality: N/A;   CORONARY STENT INTERVENTION N/A 03/02/2020   Procedure: CORONARY STENT INTERVENTION;  Surgeon: Jettie Booze, MD;  Location: [place] CV LAB;  Service: Cardiovascular;  Laterality: N/A;   INTRAVASCULAR ULTRASOUND/IVUS N/A 03/02/2020   Procedure: Intravascular Ultrasound/IVUS;  Surgeon: Jettie Booze, MD;  Location: [place] CV LAB;  Service: Cardiovascular;  Laterality: N/A;   RETINAL LASER PROCEDURE  1985   RIGHT/LEFT HEART CATH AND CORONARY ANGIOGRAPHY N/A 04/03/2018   Procedure: RIGHT/LEFT HEART CATH AND CORONARY ANGIOGRAPHY;  Surgeon: Larey Dresser, MD;  Location: [place] CV LAB;  Service: Cardiovascular;  Laterality: N/A;   RIGHT/LEFT HEART CATH AND CORONARY ANGIOGRAPHY N/A 03/02/2020   Procedure: RIGHT/LEFT HEART CATH AND CORONARY ANGIOGRAPHY;  Surgeon: Larey Dresser, MD;  Location: [place] CV LAB;  Service: Cardiovascular;  Laterality: N/A;   TEE WITHOUT CARDIOVERSION N/A 03/29/2018   Procedure: TRANSESOPHAGEAL ECHOCARDIOGRAM (TEE);  Surgeon: Larey Dresser, MD;  Location: [facility] ENDOSCOPY;  Service: Cardiovascular;  Laterality: N/A;   TEE WITHOUT CARDIOVERSION N/A 04/19/2018   Procedure: TRANSESOPHAGEAL ECHOCARDIOGRAM (TEE);  Surgeon: Larey Dresser, MD;  Location: [facility] ENDOSCOPY;  Service: Cardiovascular;  Laterality: N/A;   TEE WITHOUT CARDIOVERSION N/A 03/02/2020   Procedure: TRANSESOPHAGEAL ECHOCARDIOGRAM (TEE);  Surgeon: Larey Dresser, MD;  Location: [facility] ENDOSCOPY;  Service: Cardiovascular;  Laterality: N/A;   TEE  WITHOUT CARDIOVERSION N/A 10/26/2020   Procedure: TRANSESOPHAGEAL ECHOCARDIOGRAM (TEE);  Surgeon: Acie Fredrickson Wonda Cheng, MD;  Location: [place];  Service: Cardiovascular;  Laterality: N/A;   [place] ECHOCARDIOGRAM  02/2016   Mild LVH. Moderately reduced EF of 35-40% with diffuse hypokinesis. Indeterminate diastolic function. Likely elevated LA pressures. Mild aortic stenosis (mean gradient 10 mmHg). Mild MR. Mild LA dilation. Mild to moderate PA hypertension with 39 mmHg   VIDEO ASSISTED THORACOSCOPY (VATS)/THOROCOTOMY Right 01/24/2015   Procedure: VIDEO ASSISTED THORACOSCOPY (VATS) FOR DRAINAGE OF RIGHT HEMOTHORAX;  Surgeon: Gaye Pollack, MD;  Location: [place] OR;  Service: Thoracic;  Laterality: Right;   Social History:  reports that he has never smoked. He has never used smokeless tobacco. He reports current alcohol use. He reports that he does not use drugs. Family History:  Family History  Problem Relation Age of Onset   CAD Neg Hx      HOME MEDICATIONS: Allergies as of 03/15/2021       Reactions   Amiodarone Shortness Of Breath        Medication List        Accurate as of March 15, 2021  9:58 AM. If you have any questions, ask your nurse or doctor.          acetaminophen 500 MG tablet Commonly known as: TYLENOL Take 1,000 mg by mouth every 6 (six) hours as needed for moderate pain or headache.   Advair Diskus 100-50 MCG/ACT Aepb Generic drug: fluticasone-salmeterol INHALE 1 PUFF INTO THE LUNGS TWICE DAILY   albuterol 108 (90 Base) MCG/ACT inhaler Commonly known as: VENTOLIN HFA Inhale 2 puffs into the lungs every 6 (six) hours as needed for wheezing or shortness of breath.   carvedilol 25 MG tablet Commonly known as: COREG Take 12.5 mg by mouth 2 (two) times daily with a meal.   cholecalciferol 1000 units tablet Commonly known as: VITAMIN D Take 1 tablet (1,000 Units total) by mouth daily.   Eliquis 5 MG Tabs  tablet Generic drug: apixaban TAKE 1 TABLET(5 MG) BY MOUTH TWICE DAILY   empagliflozin 25 MG Tabs tablet Commonly known as: JARDIANCE Take 1 tablet (25 mg total) by mouth daily before breakfast.   ezetimibe 10 MG tablet Commonly known as: ZETIA Take 1 tablet (10 mg total) by mouth daily.   glucose blood test strip Use as instructed   icosapent Ethyl 1 g capsule Commonly known as: Vascepa Take 2 capsules (2 g total) by mouth 2 (two) times daily.   insulin lispro 100 UNIT/ML KwikPen Commonly known as: HumaLOG KwikPen Max daily 15 units   Lantus SoloStar 100 UNIT/ML Solostar Pen Generic drug: insulin glargine INJECT 10 UNITS INTO THE SKIN AT BEDTIME   metFORMIN 1000 MG tablet Commonly known as: GLUCOPHAGE Take 1 tablet (1,000 mg total) by mouth daily with breakfast.   nitroGLYCERIN 0.4 MG SL tablet Commonly known as: Nitrostat Place 1 tablet (0.4 mg total) under the tongue every 5 (five) minutes as needed for chest pain.   Pen Needles 32G X 4 MM Misc Inject 1 Device into the skin in the morning, at noon, in the evening, and at bedtime. Use 1 at bedtime with Lantus.  Dx code: E11.9   polyvinyl alcohol 1.4 % ophthalmic solution Commonly known as: LIQUIFILM TEARS Place 1 drop into both eyes daily as needed for dry eyes (irritation).   ranolazine 500 MG 12 hr tablet Commonly known as: RANEXA Take 1 tablet (500 mg total) by mouth 2 (two) times daily.   rosuvastatin 40 MG tablet Commonly known as: CRESTOR Take 1 tablet (40 mg total) by mouth daily.   sacubitril-valsartan 24-26 MG Commonly known as: ENTRESTO Take 1 tablet by mouth 2 (two) times daily.   sodium polystyrene 15 GM/60ML suspension Commonly known as: KAYEXALATE Take 60 mLs (15 g total) by mouth daily as needed.   spironolactone 25 MG tablet Commonly known as: ALDACTONE Take 0.5 tablets (12.5 mg total) by mouth daily.   torsemide 20 MG tablet Commonly known as: DEMADEX Take 1 tablet (20 mg total) by  mouth in the morning. Start taking on 08/22/20         OBJECTIVE:   Vital Signs: There were no vitals taken for this visit.  Wt Readings from Last 3 Encounters:  01/31/21 250 lb (113.4 kg)  01/28/21 252 lb 3.2 oz (114.4 kg)  01/10/21 242 lb 9.6 oz (110 kg)     Exam: General: Pt appears well and is in NAD  Lungs: Clear with good BS bilat with no rales, rhonchi, or wheezes  Heart: Irregular   Extremities: Trace pretibial edema.   Neuro: MS is good with appropriate  affect, pt is alert and Ox3    DM foot exam:  111/05/2020    The skin of the feet shows extensive plantar and lateralcallous formation with thickened, curved and discolored nails The pedal pulses are 1+ on right and 1+ on left. The sensation is Decreased  to a screening 5.07, 10 gram monofilament bilaterally at the toes  Pt noted with bleeding at  the left 2nd toe nail      DATA REVIEWED:  Lab Results  Component Value Date   HGBA1C 10.0 (H) 11/08/2020   HGBA1C 6.5 (A) 05/25/2020   HGBA1C 6.8 (A) 02/24/2020   Lab Results  Component Value Date   MICROALBUR 17.6 (H) 04/17/2011   LDLCALC 59 08/02/2020   CREATININE 1.62 (H) 02/08/2021   Lab Results  Component Value Date   MICRALBCREAT 9.1 04/17/2011     Lab Results  Component Value Date   CHOL 344 (H) 11/08/2020   HDL 65.80 11/08/2020   LDLCALC 59 08/02/2020   LDLDIRECT 229.0 11/08/2020   TRIG 247.0 (H) 11/08/2020   CHOLHDL 5 11/08/2020       Results for CLAIRE, TOLHURST (MRN [id number]) as of 12/02/2020 06:59  Ref. Range 11/30/2020 [id number]  BASIC METABOLIC PANEL Unknown Rpt (A)  Sodium Latest Ref Range: 135 - 145 mEq/L 142  Potassium Latest Ref Range: 3.5 - 5.1 mEq/L 4.9  Chloride Latest Ref Range: 96 - 112 mEq/L 105  CO2 Latest Ref Range: 19 - 32 mEq/L 26  Glucose Latest Ref Range: 70 - 99 mg/dL 208 (H)  BUN Latest Ref Range: 6 - 23 mg/dL 26 (H)  Creatinine Latest Ref Range: 0.40 - 1.50 mg/dL 1.64 (H)  Calcium Latest Ref Range: 8.4 - 10.5  mg/dL 9.1  GFR Latest Ref Range: >60.00 mL/min 39.57 (L)    ASSESSMENT / PLAN / RECOMMENDATIONS:   1) Type 2 Diabetes Mellitus, Poorly controlled, With CKD III, neuropathic  and macrovascular  complications - Most recent A1c of 8.6 %. Goal A1c < 7.5 %.    - His A1c is down from 10.0% . Has been off Prednisone for ~ 3 weeks  - Will adjust insulin as below  - Not ineterested in CGM technology at this time   MEDICATIONS: - Continue Metformin 1000 , 1 tablet daily  - Increase  Lantus 12 units daily  - CF : Humalog (BG-130/35) - Jardiance 25 mg daily   EDUCATION / INSTRUCTIONS: BG monitoring instructions: Patient is instructed to check his blood sugars 1 times a day, fasting. Call [place] [facility] if: BG persistently < 70  I reviewed the Rule of 15 for the treatment of hypoglycemia in detail with the patient. Literature supplied.    2) Diabetic complications:  Eye: Does not  have known diabetic retinopathy.  Neuro/ Feet: Does have known diabetic peripheral neuropathy .  Renal: Patient does  have known baseline CKD. He   is on an ACEI/ARB at present.         F/U in 4 months    Signed electronically by: Mack Guise, MD  [facility]  [facility] [address] [place], [place] [address] Phone: [phone] FAX: [phone]   CC: [name], [address] [place] [address] Phone: [phone]  Fax: [phone]  Return to Endocrinology clinic as below: Future Appointments  Date Time Provider [place]  03/15/2021  1:00 PM Dinna Severs, Melanie Crazier, MD [facility]  03/17/2021  1:30 PM [facility]  PA/NP [facility] None  03/25/2021  4:30 PM Kalman Shan, MD [facility] None  07/19/2021  3:00 PM Bradd Canary, MD [facility]

## 2021-03-17 ENCOUNTER — Ambulatory Visit (HOSPITAL_COMMUNITY)
Admission: RE | Admit: 2021-03-17 | Discharge: 2021-03-17 | Disposition: A | Discharge status: Home / Self Care | Payer: Medicare Other | Source: Ambulatory Visit | Attending: Internal Medicine | Admitting: Internal Medicine

## 2021-03-17 ENCOUNTER — Encounter (HOSPITAL_COMMUNITY): Payer: Self-pay

## 2021-03-17 ENCOUNTER — Other Ambulatory Visit: Payer: Self-pay

## 2021-03-17 VITALS — BP 116/78 | HR 84 | Wt 258.8 lb

## 2021-03-17 DIAGNOSIS — E1122 Type 2 diabetes mellitus with diabetic chronic kidney disease: Secondary | ICD-10-CM | POA: Diagnosis not present

## 2021-03-17 DIAGNOSIS — E785 Hyperlipidemia, unspecified: Secondary | ICD-10-CM | POA: Insufficient documentation

## 2021-03-17 DIAGNOSIS — I130 Hypertensive heart and chronic kidney disease with heart failure and stage 1 through stage 4 chronic kidney disease, or unspecified chronic kidney disease: Secondary | ICD-10-CM | POA: Diagnosis not present

## 2021-03-17 DIAGNOSIS — T462X5D Adverse effect of other antidysrhythmic drugs, subsequent encounter: Secondary | ICD-10-CM | POA: Diagnosis not present

## 2021-03-17 DIAGNOSIS — Z79899 Other long term (current) drug therapy: Secondary | ICD-10-CM | POA: Insufficient documentation

## 2021-03-17 DIAGNOSIS — I5042 Chronic combined systolic (congestive) and diastolic (congestive) heart failure: Secondary | ICD-10-CM | POA: Diagnosis not present

## 2021-03-17 DIAGNOSIS — I4819 Other persistent atrial fibrillation: Secondary | ICD-10-CM

## 2021-03-17 DIAGNOSIS — Z888 Allergy status to other drugs, medicaments and biological substances status: Secondary | ICD-10-CM | POA: Diagnosis not present

## 2021-03-17 DIAGNOSIS — Z7984 Long term (current) use of oral hypoglycemic drugs: Secondary | ICD-10-CM | POA: Insufficient documentation

## 2021-03-17 DIAGNOSIS — Z955 Presence of coronary angioplasty implant and graft: Secondary | ICD-10-CM | POA: Insufficient documentation

## 2021-03-17 DIAGNOSIS — I2510 Atherosclerotic heart disease of native coronary artery without angina pectoris: Secondary | ICD-10-CM

## 2021-03-17 DIAGNOSIS — I080 Rheumatic disorders of both mitral and aortic valves: Secondary | ICD-10-CM | POA: Diagnosis not present

## 2021-03-17 DIAGNOSIS — Z7901 Long term (current) use of anticoagulants: Secondary | ICD-10-CM | POA: Insufficient documentation

## 2021-03-17 DIAGNOSIS — I340 Nonrheumatic mitral (valve) insufficiency: Secondary | ICD-10-CM

## 2021-03-17 DIAGNOSIS — I350 Nonrheumatic aortic (valve) stenosis: Secondary | ICD-10-CM | POA: Diagnosis not present

## 2021-03-17 DIAGNOSIS — I4891 Unspecified atrial fibrillation: Secondary | ICD-10-CM | POA: Insufficient documentation

## 2021-03-17 DIAGNOSIS — J45909 Unspecified asthma, uncomplicated: Secondary | ICD-10-CM | POA: Insufficient documentation

## 2021-03-17 DIAGNOSIS — I5032 Chronic diastolic (congestive) heart failure: Secondary | ICD-10-CM

## 2021-03-17 DIAGNOSIS — N183 Chronic kidney disease, stage 3 unspecified: Secondary | ICD-10-CM | POA: Diagnosis not present

## 2021-03-17 DIAGNOSIS — Z8616 Personal history of COVID-19: Secondary | ICD-10-CM | POA: Insufficient documentation

## 2021-03-17 DIAGNOSIS — Z09 Encounter for follow-up examination after completed treatment for conditions other than malignant neoplasm: Secondary | ICD-10-CM | POA: Insufficient documentation

## 2021-03-17 DIAGNOSIS — I255 Ischemic cardiomyopathy: Secondary | ICD-10-CM | POA: Diagnosis not present

## 2021-03-17 LAB — BASIC METABOLIC PANEL
Anion gap: 7 (ref 5–15)
BUN: 41 mg/dL — ABNORMAL HIGH (ref 8–23)
CO2: 26 mmol/L (ref 22–32)
Calcium: 9.4 mg/dL (ref 8.9–10.3)
Chloride: 105 mmol/L (ref 98–111)
Creatinine, Ser: 1.67 mg/dL — ABNORMAL HIGH (ref 0.61–1.24)
GFR, Estimated: 41 mL/min — ABNORMAL LOW (ref >60)
Glucose, Bld: 177 mg/dL — ABNORMAL HIGH (ref 70–99)
Potassium: 4.7 mmol/L (ref 3.5–5.1)
Sodium: 138 mmol/L (ref 135–145)

## 2021-03-17 MED ORDER — SPIRONOLACTONE 25 MG PO TABS: 25.0000 mg | ORAL_TABLET | Freq: Every day | ORAL | 6 refills | Status: DC

## 2021-03-17 NOTE — Progress Notes (Signed)
Advanced Heart Failure Clinic Note   PCP: [name], MD  Cardiologist: Loralie Champagne, MD   David Huynh is a 80 y.o. male with a history of CAD, chronic systolic CHF, HTN, HLD, DM, CKD 3, and asthma. Patient had EF 35-40% in 9/17, had cath with DES to RCA then repeat echo with EF up to 55-60% by 3/18.   Admitted 03/2018 with SOB x 3 weeks. No cardiology follow up in >1 year. Found to be in atrial fibrillation with RVR. EF down to 35-40%. Poor diuresis with IV lasix, so HF team consulted. He was started on amiodarone drip. PICC line placed with CVP markedly elevated and Coox marginal. Held off on inotropes with concern for making afib worse. Started on lasix drip. He started eliquis and had TEE/DCCV 03/29/18. Was converted to NSR, but shortly after went back into AF. Ranexa added. Attempted repeat DCCV 04/02/18, but was unsuccessful. EP consulted and recommended repeat LHC prior to repeat DCCV. Had [facility] 04/03/18 with PCI to proximal LCx.  He then had TEE-DCCV on 04/19/18 back to NSR. EF on TEE was 40%.  He saw Dr. Rayann Heman to discuss ablation and opted to continue amiodarone.   Echo in 3/20 showed EF 50% with diffuse hypokinesis, normal RV, mild AS and mild MR.    He was lost to followup in this office again for over a year.  In 7/21, he was admitted for a couple of days with CHF exacerbation and was treated with IV Lasix.  Echo was done in 7/21, EF back down to 25-30%, global hypokinesis, severely decreased RV systolic function with severe RV enlargement, at least moderate aortic stenosis.  He was in NSR.    TEE was done in 10/21 for closer evaluation of aortic valve, this showed EF actually up to 50% with moderate AS and moderate MR.  LHC/RHC was done, showing normal filling pressures and significant LAD disease.  The patient is now s/p DES to LAD.   He was admitted on [facility] on 08/10/20 with atrial fibrillation/RVR and CHF.  Amiodarone was started and he was cardioverted to NSR.  Echo in  3/22 showed EF 50-55% with normal RV and mild AS, mild MR.  He developed acute amiodarone lung toxicity with elevated ESR and patchy bilateral infiltrates.  Amiodarone was stopped and prednisone was started.  Oxygen saturation gradually increased over time and he was eventually able to wean off oxygen.   In 6/22, he had atrial fibrillation ablation.   In 8/22, he was admitted with COVID-19 PNA and atrial fibrillation/RVR.  He had a cardioversion back to NSR.  Echo in 8/22 showed EF 45-50%, moderate LVH, RV normal, mild MR, moderate AS.   CT chest in 9/22 showed fibrosis pattern still present but improved.   He returns today for followup of CHF and CAD.  He is in NSR today.  Weight is down.  He is using a walker for balance but denies dyspnea walking on flat ground.  Stamina poor.  No chest pain.  No lightheadedness.  To start going to gym with a trainer.  He is now off oxygen and has completed prednisone course for amiodarone lung toxicity.   Today he returns for HF follow up. Breathing is much better since being off amio and completing course of prednisone. He does not have significant exertional dyspnea with walking on flat ground. He is living at [employer] [facility] with his wife. He is exercising using stationary bike and leg press  machine twice a week. Overall feeling fine. Denies CP, dizziness, edema, or PND/Orthopnea. Appetite ok. No fever or chills. Weight at home 249 pounds. Taking all medications. He is not wearing oxygen. He has AliveCore at home and has not had any arrhythmias recently on this device or his Apple Watch.  ECG (personally reviewed): SR with PVCs QTC 454 msec  Labs (12/19): K 4.2, creatinine 1.35, LFTs normal Las (7/21): LDL 198, K 3.7, creatinine 1.75 Labs (8/21): K 4, creatinine 1.48, BNP 593 Labs (10/21): K 3.9, creatinine 1.28, LDL 230 (not taking statin), TSH normal, LFTs normal Labs (11/21): K 3.8, creatinine 1.34 Labs (4/22): K 6, creatinine 2.41 Labs  (5/22): hgb 12.8, K 4, creatinine 1.52 Labs (8/22): hgb 12.2, K 4.2, creatinine 1.44 Labs (9/22): K 4.1, creatinine 1.62  SH: No tobacco or drug use. Rare ETOH use. Lives at [place] with his wife. 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  FH: No family hx of CAD or CHF.   Review of systems complete and found to be negative unless listed in HPI.   PMH: 1. CKD: Stage 3.  2. Atrial fibrillation: Paroxysmal => persistent, poorly tolerated with history of tachycardia-mediated CMP.   - DCCV x 2 in 11/19 and once in 12/19.  - DCCV to NSR in 3/22 on amiodarone, developed amiodarone lung toxicity and amiodarone stopped.  Went back into atrial fibrillation.  - Atrial fibrillation ablation 6/22.  - DCCV to NSR 8/22.  3. Type II diabetes.  4. HTN 5. Hyperlipidemia.  6. CAD: DES to mid RCA in 2017.   - LHC (11/19): 50% mLAD, 50% dLAD, 80-85% proximal LCx stenosis, OM1 small vessel with 90% stenois. PCI to proximal LCx.  - LHC (10/21): 80% pLAD, 70% p/mLAD, 50% m/d LAD, 80% superior branch OM2, 60% inferior branch OM2, 50% mPDA. PCI with DES to LAD.  7. Chronic systolic CHF: Suspect this was primarily tachy-cardia mediated.  - Echo (11/19): EF 35-40%, moderate to severely decreased RV systolic function.  - TEE (12/19): EF 40%, diffuse hypokinesis, normal RV size and systolic function.  - Echo (3/20): EF 50%, normal RV, mild MR, mild AS with mean gradient 14 mmHg. - Echo (7/21): EF 25-30%, global hypokinesis, severely decreased RV systolic function with severe RV enlargement, at least moderate aortic stenosis.   - TEE (10/21): EF 50%, anterior hypokinesis, basal inferior hypokinesis, normal RV, moderate AS with mean gradient 22 mmHg and AVA 1.18 cm^2, moderate MR with ERO 0.32 cm^2.  - Echo (3/22): EF 50-55% with normal RV and mild AS, mild MR - RHC (10/21): mean RA 2, PA 29/4, mean PCWP 6, CI 2.66, easily crossed AoV with mean gradient 15 mmHg.  - Echo (8/22): EF 45-50%, moderate LVH, RV normal, mild MR,  moderate AS.  8. Aortic stenosis: Mild by 3/20 echo. 7/21 echo with at least moderate AS.  - Moderate on 8/22 echo.  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  - CT chest (9/22): Fibrosis present but improved.  10. COVID-19 PNA (8/22)  Current Outpatient Medications  Medication Sig Dispense Refill   acetaminophen (TYLENOL) 500 MG tablet Take 1,000 mg by mouth every 6 (six) hours as needed for moderate pain or headache.     albuterol (VENTOLIN HFA) 108 (90 Base) MCG/ACT inhaler Inhale 2 puffs into the lungs every 6 (six) hours as needed for wheezing or shortness of breath. 18 g 1   carvedilol (COREG) 25 MG tablet Take 12.5 mg by mouth 2 (two) times daily  with a meal.     cholecalciferol (VITAMIN D) 1000 units tablet Take 1 tablet (1,000 Units total) by mouth daily. 30 tablet 0   ELIQUIS 5 MG TABS tablet TAKE 1 TABLET(5 MG) BY MOUTH TWICE DAILY 60 tablet 11   empagliflozin (JARDIANCE) 25 MG TABS tablet Take 1 tablet (25 mg total) by mouth daily before breakfast. 90 tablet 2   ezetimibe (ZETIA) 10 MG tablet Take 1 tablet (10 mg total) by mouth daily. 90 tablet 3   fluticasone-salmeterol (ADVAIR DISKUS) 100-50 MCG/ACT AEPB INHALE 1 PUFF INTO THE LUNGS TWICE DAILY 60 each 5   glucose blood test strip Use as instructed 100 each 12   icosapent Ethyl (VASCEPA) 1 g capsule Take 2 capsules (2 g total) by mouth 2 (two) times daily. 120 capsule 11   insulin glargine (LANTUS SOLOSTAR) 100 UNIT/ML Solostar Pen Inject 12 Units into the skin at bedtime. 15 mL 3   insulin lispro (HUMALOG KWIKPEN) 100 UNIT/ML KwikPen Max daily 15 units 15 mL 6   Insulin Pen Needle (PEN NEEDLES) 32G X 4 MM MISC Inject 1 Device into the skin in the morning, at noon, in the evening, and at bedtime. Use 1 at bedtime with Lantus.  Dx code: E11.9 400 each 1   metFORMIN (GLUCOPHAGE) 1000 MG tablet Take 1 tablet (1,000 mg total) by mouth daily with breakfast. 90 tablet 3   nitroGLYCERIN (NITROSTAT) 0.4 MG SL tablet  Place 1 tablet (0.4 mg total) under the tongue every 5 (five) minutes as needed for chest pain. 30 tablet 0   polyvinyl alcohol (LIQUIFILM TEARS) 1.4 % ophthalmic solution Place 1 drop into both eyes daily as needed for dry eyes (irritation).     ranolazine (RANEXA) 500 MG 12 hr tablet Take 1 tablet (500 mg total) by mouth 2 (two) times daily. 180 tablet 0   rosuvastatin (CRESTOR) 40 MG tablet Take 1 tablet (40 mg total) by mouth daily. 30 tablet 3   sacubitril-valsartan (ENTRESTO) 24-26 MG Take 1 tablet by mouth 2 (two) times daily. 60 tablet 5   sodium polystyrene (KAYEXALATE) 15 GM/60ML suspension Take 60 mLs (15 g total) by mouth daily as needed. 240 mL 0   spironolactone (ALDACTONE) 25 MG tablet Take 0.5 tablets (12.5 mg total) by mouth daily. 15 tablet 3   torsemide (DEMADEX) 20 MG tablet Take 1 tablet (20 mg total) by mouth in the morning. Start taking on 08/22/20 30 tablet 0   Current Facility-Administered Medications  Medication Dose Route Frequency Provider Last Rate Last Admin   insulin starter kit- pen needles (English) 1 kit  1 kit Other Once [name], MD        Allergies  Allergen Reactions   Amiodarone Shortness Of Breath   BP 116/78   Pulse 84   Wt 117.4 kg (258 lb 12.8 oz)   SpO2 97%   BMI 35.10 kg/m   Wt Readings from Last 3 Encounters:  03/17/21 117.4 kg (258 lb 12.8 oz)  03/15/21 115.2 kg (254 lb)  01/31/21 113.4 kg (250 lb)   PHYSICAL EXAM: General:  NAD. No resp difficulty HEENT: Normal Neck: Supple. No JVD. Carotids 2+ bilat; no bruits. No lymphadenopathy or thryomegaly appreciated. Cor: PMI nondisplaced. Regular rate & rhythm. No rubs, gallops or murmurs. Lungs: Clear Abdomen: Obese, nontender, nondistended. No hepatosplenomegaly. No bruits or masses. Good bowel sounds. Extremities: No cyanosis, clubbing, rash, trace LE edema Neuro: Alert & oriented x 3, cranial nerves grossly intact. Moves all 4 extremities  w/o difficulty. Affect  pleasant.  ASSESSMENT & PLAN: 1. Chronic systolic=>diastolic CHF: Prior ischemic cardiomyopathy, improved after PCI in 2017.  Echo 03/2018 with EF back down to 35-40% and moderate-severe RV dysfunction. This was in the setting of atrial fibrillation with RVR. EF 40% on TEE 04/2018.  Echo was in 3/20, showing EF up to 50%.  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  Echo in 7/21 with EF 25-30%, severe RV dysfunction.  TEE in 10/21, however, showed EF up to 50%.  He had PCI to LAD in 10/21. Echo in 3/22 showed stable EF 50-55% with normal RV.   Echo in 8/22 with EF 45-50%, moderate LVH, normal RV, moderate AS.  On exam, he is not volume overloaded. Stable NYHA I-II. - Increase spironolactone to 25 mg daily. BMET today, repeat in 1 week. - Continue torsemide 20 mg daily.   - Continue Entresto 24/26 mg bid.   - Continue Coreg 12.5 mg bid.  - Continue empagliflozin 25 mg (DM2) daily.   2. Atrial fibrillation: As above, concerned for possible tachy-mediated CMP.  Had successful DC-CV on 03/29/18 but then back in atrial fibrillation. Ranolazine added, but failed DCCV again 04/02/18. DCCV 04/19/18 was successful.  AF with RVR at 3/22 admission, amiodarone restarted and he was cardioverted to NSR, but he developed amiodarone lung toxicity.  Amiodarone was stopped. Now s/p atrial fibrillation ablation in 6/22.  He has been in NSR recently.  - Currently on ranolazine 500 mg BID, QTc acceptable on today's ECG.  - No amiodarone with history of lung toxicity.   - Continue Eliquis 5 mg bid.  No abnormal bleeding. 3. CKD: Stage 3.  BMET today.  - Has appointment been referred to nephrology.  - Continue empagliflozin.  4. CAD: History of DES to RCA in 9/17.  Underwent LHC 04/03/18 with PCI to prox left circ. LHC in 10/21 with PCI to LAD.   - He is on Eliquis, so no ASA.   - Continue statin.  5. Aortic stenosis: Moderate on 8/22 echo.  6. Mitral regurgitation: Mild on 8/22 echo.  7.  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  He improved with steroids.  Now off oxygen and steroids.    Follow up in 2-3 months with Dr. Loistine Simas, FNP 03/17/21

## 2021-03-17 NOTE — Patient Instructions (Addendum)
INCREASE Spironolactone 25mg  (1 tab)  daily  Labs today and repeat in 1 week We will only contact you if something comes back abnormal or we need to make some changes. Otherwise no news is good news!  Your physician recommends that you schedule a follow-up appointment in: 2-3 months with Dr .  Please call office at [phone] option 2 if you have any questions or concerns.   At the [facility] Clinic, you and your health needs are our priority. As part of our continuing mission to provide you with exceptional heart care, we have created designated Provider Care Teams. These Care Teams include your primary Cardiologist (physician) and Advanced Practice Providers (APPs- Physician Assistants and Nurse Practitioners) who all work together to provide you with the care you need, when you need it.   You may see any of the following providers on your designated Care Team at your next follow up: Dr [phone] Dr Arvilla Meres, NP Carron Curie, Robbie Lis [facility] [place], [place], PharmD   Please be sure to bring in all your medications bottles to every appointment.

## 2021-03-18 ENCOUNTER — Telehealth (HOSPITAL_COMMUNITY): Payer: Self-pay

## 2021-03-18 MED ORDER — SPIRONOLACTONE 25 MG PO TABS: 12.5000 mg | ORAL_TABLET | Freq: Every day | ORAL | 3 refills | Status: DC

## 2021-03-18 MED ORDER — TORSEMIDE 10 MG PO TABS: 10.0000 mg | ORAL_TABLET | Freq: Every morning | ORAL | 3 refills | Status: DC

## 2021-03-18 NOTE — Telephone Encounter (Signed)
-----   Message from Jacklynn Ganong, [place] sent at 03/17/2021  3:10 PM EDT ----- BUN/SCr elevated today. Do not increase spiro as discussed at visit today. Please reduce torsemide to 10 mg daily. Repeat BMET in 2 weeks please.

## 2021-03-18 NOTE — Telephone Encounter (Signed)
Patient advised and verbalized understanding. New Rx sent, patient has pending lab appt  Meds ordered this encounter  Medications   spironolactone (ALDACTONE) 25 MG tablet    Sig: Take 0.5 tablets (12.5 mg total) by mouth daily.    Dispense:  45 tablet    Refill:  3    Dose decreased back down to 12.5mg  qd Please cancel all previous orders for current medication. Change in dosage or pill size.   torsemide (DEMADEX) 10 MG tablet    Sig: Take 1 tablet (10 mg total) by mouth in the morning.    Dispense:  90 tablet    Refill:  3    Please cancel all previous orders for current medication. Change in dosage or pill size.

## 2021-03-24 ENCOUNTER — Ambulatory Visit (HOSPITAL_COMMUNITY)
Admission: RE | Admit: 2021-03-24 | Discharge: 2021-03-24 | Disposition: A | Discharge status: Home / Self Care | Payer: Medicare Other | Source: Ambulatory Visit | Attending: Cardiology | Admitting: Cardiology

## 2021-03-24 DIAGNOSIS — I5032 Chronic diastolic (congestive) heart failure: Secondary | ICD-10-CM | POA: Insufficient documentation

## 2021-03-24 LAB — BASIC METABOLIC PANEL
Anion gap: 9 (ref 5–15)
BUN: 29 mg/dL — ABNORMAL HIGH (ref 8–23)
CO2: 27 mmol/L (ref 22–32)
Calcium: 9.4 mg/dL (ref 8.9–10.3)
Chloride: 103 mmol/L (ref 98–111)
Creatinine, Ser: 1.57 mg/dL — ABNORMAL HIGH (ref 0.61–1.24)
GFR, Estimated: 45 mL/min — ABNORMAL LOW (ref >60)
Glucose, Bld: 148 mg/dL — ABNORMAL HIGH (ref 70–99)
Potassium: 4.7 mmol/L (ref 3.5–5.1)
Sodium: 139 mmol/L (ref 135–145)

## 2021-03-25 ENCOUNTER — Ambulatory Visit: Payer: Medicare Other | Admitting: Internal Medicine

## 2021-04-21 ENCOUNTER — Ambulatory Visit: Payer: Medicare Other

## 2021-04-25 ENCOUNTER — Ambulatory Visit: Payer: Medicare Other | Admitting: Internal Medicine

## 2021-04-27 ENCOUNTER — Other Ambulatory Visit: Payer: Self-pay | Admitting: Internal Medicine

## 2021-05-02 ENCOUNTER — Other Ambulatory Visit: Payer: Self-pay | Admitting: Family Medicine

## 2021-05-24 ENCOUNTER — Ambulatory Visit (INDEPENDENT_AMBULATORY_CARE_PROVIDER_SITE_OTHER): Payer: Medicare Other | Admitting: Internal Medicine

## 2021-05-24 ENCOUNTER — Other Ambulatory Visit: Payer: Self-pay

## 2021-05-24 ENCOUNTER — Encounter: Payer: Self-pay | Admitting: Internal Medicine

## 2021-05-24 VITALS — BP 130/70 | HR 76 | Temp 97.9°F | Ht 72.0 in | Wt 273.2 lb

## 2021-05-24 DIAGNOSIS — J984 Other disorders of lung: Secondary | ICD-10-CM

## 2021-05-24 NOTE — Progress Notes (Signed)
Did not see patent

## 2021-05-26 ENCOUNTER — Ambulatory Visit: Payer: Medicare Other | Admitting: Internal Medicine

## 2021-06-09 ENCOUNTER — Ambulatory Visit: Payer: Medicare Other | Admitting: Family Medicine

## 2021-06-17 ENCOUNTER — Encounter (HOSPITAL_COMMUNITY): Payer: Medicare Other | Admitting: Cardiology

## 2021-06-22 DIAGNOSIS — M1711 Unilateral primary osteoarthritis, right knee: Secondary | ICD-10-CM | POA: Diagnosis not present

## 2021-06-29 DIAGNOSIS — M1711 Unilateral primary osteoarthritis, right knee: Secondary | ICD-10-CM | POA: Diagnosis not present

## 2021-07-01 ENCOUNTER — Other Ambulatory Visit (HOSPITAL_COMMUNITY): Payer: Self-pay

## 2021-07-01 MED ORDER — APIXABAN 5 MG PO TABS: ORAL_TABLET | ORAL | 8 refills | Status: DC

## 2021-07-03 ENCOUNTER — Other Ambulatory Visit: Payer: Self-pay | Admitting: Family Medicine

## 2021-07-04 ENCOUNTER — Other Ambulatory Visit: Payer: Self-pay

## 2021-07-04 MED ORDER — EZETIMIBE 10 MG PO TABS: 10.0000 mg | ORAL_TABLET | Freq: Every day | ORAL | 0 refills | Status: DC

## 2021-07-06 DIAGNOSIS — M1711 Unilateral primary osteoarthritis, right knee: Secondary | ICD-10-CM | POA: Diagnosis not present

## 2021-07-13 ENCOUNTER — Ambulatory Visit: Payer: Medicare Other | Admitting: Internal Medicine

## 2021-07-19 ENCOUNTER — Ambulatory Visit: Payer: Medicare Other | Admitting: Family Medicine

## 2021-07-29 ENCOUNTER — Other Ambulatory Visit (HOSPITAL_COMMUNITY): Payer: Self-pay | Admitting: Adult Health

## 2021-08-01 ENCOUNTER — Other Ambulatory Visit (HOSPITAL_COMMUNITY): Payer: Self-pay

## 2021-08-01 MED ORDER — ICOSAPENT ETHYL 1 G PO CAPS: 2.0000 g | ORAL_CAPSULE | Freq: Two times a day (BID) | ORAL | 0 refills | Status: DC

## 2021-08-02 ENCOUNTER — Encounter (HOSPITAL_COMMUNITY): Payer: Medicare Other | Admitting: Cardiology

## 2021-08-03 ENCOUNTER — Other Ambulatory Visit: Payer: Self-pay | Admitting: Cardiology

## 2021-08-24 ENCOUNTER — Ambulatory Visit: Payer: Medicare Other | Admitting: Internal Medicine

## 2021-09-22 ENCOUNTER — Ambulatory Visit (INDEPENDENT_AMBULATORY_CARE_PROVIDER_SITE_OTHER): Payer: Medicare Other | Admitting: Internal Medicine

## 2021-09-22 ENCOUNTER — Encounter: Payer: Self-pay | Admitting: Internal Medicine

## 2021-09-22 VITALS — BP 130/80 | HR 84 | Ht 72.0 in | Wt 274.0 lb

## 2021-09-22 DIAGNOSIS — E1142 Type 2 diabetes mellitus with diabetic polyneuropathy: Secondary | ICD-10-CM

## 2021-09-22 DIAGNOSIS — E1122 Type 2 diabetes mellitus with diabetic chronic kidney disease: Secondary | ICD-10-CM

## 2021-09-22 DIAGNOSIS — Z794 Long term (current) use of insulin: Secondary | ICD-10-CM | POA: Diagnosis not present

## 2021-09-22 DIAGNOSIS — N1831 Chronic kidney disease, stage 3a: Secondary | ICD-10-CM

## 2021-09-22 DIAGNOSIS — R739 Hyperglycemia, unspecified: Secondary | ICD-10-CM

## 2021-09-22 DIAGNOSIS — N183 Chronic kidney disease, stage 3 unspecified: Secondary | ICD-10-CM | POA: Diagnosis not present

## 2021-09-22 LAB — POCT GLYCOSYLATED HEMOGLOBIN (HGB A1C): Hemoglobin A1C: 7.7 % — AB (ref 4.0–5.6)

## 2021-09-22 LAB — POCT GLUCOSE (DEVICE FOR HOME USE): Glucose Fasting, POC: 127 mg/dL — AB (ref 70–99)

## 2021-09-22 NOTE — Progress Notes (Signed)
?Name: David Huynh  ?Age/ Sex: 81 y.o., male   ?MRN/ DOB: [id number], 12-27-1940    ? ?PCP: [name], MD   ?Reason for Endocrinology Evaluation: Type 2 Diabetes Mellitus  ?Initial Endocrine Consultative Visit: 02/24/2020  ? ? ?PATIENT IDENTIFIER: David Huynh is a 81 y.o. male with a past medical history of T2DM, A. Fib, HTN and dyslipidemia. The patient has followed with Endocrinology clinic since 02/24/2020 for consultative assistance with management of his diabetes. ? ?DIABETIC HISTORY:  ?David Huynh was diagnosed with T2DM many years ago. He has not been on insulin in the past. His hemoglobin A1c has ranged from 7.3% in 2012, peaking at 9.9% in 2016. ? ? ?SUBJECTIVE:  ? ?During the last visit (03/15/2021): A1c 8.6%.  continued  Metformin,and jardiance and adjusted MDI regimen  ? ?Today (09/22/2021): David Huynh is here for a follow up on diabetes care.  He checks his blood sugars 1 times daily. The patient has not had hypoglycemic episodes since the last clinic visit. ? ? ?He follows with pulm. Due to amiodarone pulmonary  toxicity , last visit 05/2021 ?Follows with cardiology for A. Fib and CAD, last follow up  03/2022 ?Follows with Ortho , last visit 06/2021 ?  ? ?Moved to a retirement community on [place]  ? ?He is under the impression that Vania Rea has been discontinued through cardiology ? ? ? ? ? ?HOME DIABETES REGIMEN:  ?Metformin 1000 , 1 tablet daily  ?Jardiance 25 mg daily - not taking  ?Lantus 12 units daily  ?Humalog (BG -130/45)  ? ? ? ? ?Statin: yes ?ACE-I/ARB: yes ? ? ? ?GLUCOSE LOG: ? ? ? ?DIABETIC COMPLICATIONS: ?Microvascular complications:  ?CKD III ?Denies: retinopathy, neuropathy  ?Last Eye Exam: Completed 2022 ? ?Macrovascular complications:  ?CAD ( S/P stent ) /CHF ?Denies:  CVA, PVD ? ? ?HISTORY:  ?Past Medical History:  ?Past Medical History:  ?Diagnosis Date  ? Allergy   ? seasonal  ? Asthma   ? mild, intermittent  ? Atrial fibrillation with rapid ventricular  response ([place]) 03/25/2018  ? CAD, multiple vessel   ? Three-vessel disease involving mid RCA 85% (PCI with DES), OM 2 80% in branch and ostD1 ~90%. Only the mid RCA with PCI target.   ? Carotid bruit 08/05/2010  ? Bruit noted on left   ? Chronic combined systolic and diastolic CHF (congestive heart failure) ([place])   ? CKD (chronic kidney disease), stage III ([place])   ? Diabetes mellitus   ? type 2  ? Fracture of multiple ribs 02/12/2015  ? Hemothorax on right 01/23/2015  ? Hyperlipidemia   ? Hypertension   ? Hypocalcemia 02/12/2015  ? Lactic acidosis 03/25/2018  ? Liver function study, abnormal 04/26/2011  ? Multiple rib fractures   ? Obesity   ? Persistent atrial fibrillation ([place])   ? Pneumonia   ? ?Past Surgical History:  ?Past Surgical History:  ?Procedure Laterality Date  ? APPENDECTOMY  1975  ? ATRIAL FIBRILLATION ABLATION N/A 10/26/2020  ? Procedure: ATRIAL FIBRILLATION ABLATION;  Surgeon: Thompson Grayer, MD;  Location: [place] CV [facility];  Service: Cardiovascular;  Laterality: N/A;  ? CARDIAC CATHETERIZATION N/A 02/17/2016  ? Procedure: Left Heart Cath and Coronary Angiography;  Surgeon: Leonie Man, MD;  Location: [place] [facility];  Service: Cardiovascular: mRCA 85% (PCI), branch of OM2 80%,  ostD1 90%; EF 35-45%  ? CARDIAC CATHETERIZATION N/A 02/17/2016  ? Procedure: Coronary Stent Intervention;  Surgeon: Leonie Man, MD;  Location: [place] [facility];  Service: Cardiovascular: mRCA PCI : STENT PROMUS PREM MR [id number] DES  ? CARDIOVERSION N/A 03/29/2018  ? Procedure: CARDIOVERSION;  Surgeon: Larey Dresser, MD;  Location: [facility] ENDOSCOPY;  Service: Cardiovascular;  Laterality: N/A;  ? CARDIOVERSION N/A 04/02/2018  ? Procedure: CARDIOVERSION;  Surgeon: Larey Dresser, MD;  Location: [facility] ENDOSCOPY;  Service: Cardiovascular;  Laterality: N/A;  ? CARDIOVERSION N/A 04/19/2018  ? Procedure: CARDIOVERSION;  Surgeon: Larey Dresser, MD;  Location: [facility] ENDOSCOPY;  Service: Cardiovascular;  Laterality: N/A;  ?  CARDIOVERSION N/A 08/11/2020  ? Procedure: CARDIOVERSION;  Surgeon: Larey Dresser, MD;  Location: [facility] ENDOSCOPY;  Service: Cardiovascular;  Laterality: N/A;  ? CARDIOVERSION N/A 12/28/2020  ? Procedure: CARDIOVERSION;  Surgeon: Larey Dresser, MD;  Location: [facility] ENDOSCOPY;  Service: Cardiovascular;  Laterality: N/A;  ? CATARACT EXTRACTION  2010  ? b/l  ? CORONARY STENT INTERVENTION N/A 04/03/2018  ? Procedure: CORONARY STENT INTERVENTION;  Surgeon: Troy Sine, MD;  Location: [place] [facility];  Service: Cardiovascular;  Laterality: N/A;  ? CORONARY STENT INTERVENTION N/A 03/02/2020  ? Procedure: CORONARY STENT INTERVENTION;  Surgeon: Jettie Booze, MD;  Location: [place] [facility];  Service: Cardiovascular;  Laterality: N/A;  ? INTRAVASCULAR ULTRASOUND/IVUS N/A 03/02/2020  ? Procedure: Intravascular Ultrasound/IVUS;  Surgeon: Jettie Booze, MD;  Location: [place] [facility];  Service: Cardiovascular;  Laterality: N/A;  ? [place]  ? RIGHT/LEFT HEART CATH AND CORONARY ANGIOGRAPHY N/A 04/03/2018  ? Procedure: RIGHT/LEFT HEART CATH AND CORONARY ANGIOGRAPHY;  Surgeon: Larey Dresser, MD;  Location: [place] [facility];  Service: Cardiovascular;  Laterality: N/A;  ? RIGHT/LEFT HEART CATH AND CORONARY ANGIOGRAPHY N/A 03/02/2020  ? Procedure: RIGHT/LEFT HEART CATH AND CORONARY ANGIOGRAPHY;  Surgeon: Larey Dresser, MD;  Location: [place] [facility];  Service: Cardiovascular;  Laterality: N/A;  ? TEE WITHOUT CARDIOVERSION N/A 03/29/2018  ? Procedure: TRANSESOPHAGEAL ECHOCARDIOGRAM (TEE);  Surgeon: Larey Dresser, MD;  Location: [facility] ENDOSCOPY;  Service: Cardiovascular;  Laterality: N/A;  ? TEE WITHOUT CARDIOVERSION N/A 04/19/2018  ? Procedure: TRANSESOPHAGEAL ECHOCARDIOGRAM (TEE);  Surgeon: Larey Dresser, MD;  Location: [facility] ENDOSCOPY;  Service: Cardiovascular;  Laterality: N/A;  ? TEE WITHOUT CARDIOVERSION N/A 03/02/2020  ? Procedure: TRANSESOPHAGEAL ECHOCARDIOGRAM (TEE);   Surgeon: Larey Dresser, MD;  Location: [facility] ENDOSCOPY;  Service: Cardiovascular;  Laterality: N/A;  ? TEE WITHOUT CARDIOVERSION N/A 10/26/2020  ? Procedure: TRANSESOPHAGEAL ECHOCARDIOGRAM (TEE);  Surgeon: Acie Fredrickson Wonda Cheng, MD;  Location: [facility] ENDOSCOPY;  Service: Cardiovascular;  Laterality: N/A;  ? [place]  ? TRANSTHORACIC ECHOCARDIOGRAM  02/2016  ? Mild LVH. Moderately reduced EF of 35-40% with diffuse hypokinesis. Indeterminate diastolic function. Likely elevated LA pressures. Mild aortic stenosis (mean gradient 10 mmHg). Mild MR. Mild LA dilation. Mild to moderate PA hypertension with 39 mmHg  ? VIDEO ASSISTED THORACOSCOPY (VATS)/THOROCOTOMY Right 01/24/2015  ? Procedure: VIDEO ASSISTED THORACOSCOPY (VATS) FOR DRAINAGE OF RIGHT HEMOTHORAX;  Surgeon: Gaye Pollack, MD;  Location: [place];  Service: Thoracic;  Laterality: Right;  ? ?Social History:  reports that he has never smoked. He has never used smokeless tobacco. He reports current alcohol use. He reports that he does not use drugs. ?Family History:  ?Family History  ?Problem Relation Age of Onset  ? CAD Neg Hx   ? ? ? ?HOME MEDICATIONS: ?Allergies as of 09/22/2021   ? ?   Reactions  ? Amiodarone Shortness Of Breath  ? ?  ? ?  ?  Medication List  ?  ? ?  ? Accurate as of Sep 22, 2021  2:03 PM. If you have any questions, ask your nurse or doctor.  ?  ?  ? ?  ? ?STOP taking these medications   ? ?empagliflozin 25 MG Tabs tablet ?Commonly known as: JARDIANCE ?Stopped by: Dorita Sciara, MD ?  ? ?  ? ?TAKE these medications   ? ?acetaminophen 500 MG tablet ?Commonly known as: TYLENOL ?Take 1,000 mg by mouth every 6 (six) hours as needed for moderate pain or headache. ?  ?Advair Diskus 100-50 MCG/ACT Aepb ?Generic drug: fluticasone-salmeterol ?INHALE 1 PUFF INTO THE LUNGS TWICE DAILY ?  ?apixaban 5 MG Tabs tablet ?Commonly known as: Eliquis ?TAKE 1 TABLET(5 MG) BY MOUTH TWICE DAILY ?  ?carvedilol 25 MG tablet ?Commonly known as:  COREG ?Take 12.5 mg by mouth 2 (two) times daily with a meal. ?  ?cholecalciferol 1000 units tablet ?Commonly known as: VITAMIN D ?Take 1 tablet (1,000 Units total) by mouth daily. ?  ?ezetimibe 10 MG tablet ?Commonly

## 2021-09-22 NOTE — Patient Instructions (Signed)
?-   Continue Metformin 1000 , 1 tablet daily  ?- Continue  Lantus 12 units daily  ?- Humalog correctional insulin: Use the scale below to help guide you with each meal  ? ?Blood sugar before meal Number of units to inject  ?Less than 165 0 unit  ?166 -  200 1 units  ?201 -  235 2 units  ?236 -  270 3 units  ?271 -  305 4 units  ?306 - 340 5 units  ? ? ? ? ? ?HOW TO TREAT LOW BLOOD SUGARS (Blood sugar LESS THAN 70 MG/DL) ?Please follow the RULE OF 15 for the treatment of hypoglycemia treatment (when your (blood sugars are less than 70 mg/dL)  ? ?STEP 1: Take 15 grams of carbohydrates when your blood sugar is low, which includes:  ?3-4 GLUCOSE TABS  OR ?3-4 OZ OF JUICE OR REGULAR SODA OR ?ONE TUBE OF GLUCOSE GEL   ? ?STEP 2: RECHECK blood sugar in 15 MINUTES ?STEP 3: If your blood sugar is still low at the 15 minute recheck --> then, go back to STEP 1 and treat AGAIN with another 15 grams of carbohydrates. ? ?

## 2021-09-27 ENCOUNTER — Other Ambulatory Visit (HOSPITAL_COMMUNITY): Payer: Self-pay | Admitting: Cardiology

## 2021-09-29 ENCOUNTER — Other Ambulatory Visit (HOSPITAL_COMMUNITY): Payer: Self-pay

## 2021-09-29 MED ORDER — SACUBITRIL-VALSARTAN 24-26 MG PO TABS: 1.0000 | ORAL_TABLET | Freq: Two times a day (BID) | ORAL | 1 refills | Status: DC

## 2021-12-02 ENCOUNTER — Telehealth: Payer: Self-pay | Admitting: Family Medicine

## 2021-12-02 NOTE — Telephone Encounter (Signed)
Left message for patient to call back and schedule Medicare Annual Wellness Visit (AWV) in office.  ° °If not able to come in office, please offer to do virtually or by telephone.  Left office number and my jabber #[phone]. ° °AWVI eligible as of 05/15/2009 ° °Please schedule at anytime with Nurse Health Advisor. °  °

## 2021-12-03 ENCOUNTER — Other Ambulatory Visit (HOSPITAL_COMMUNITY): Payer: Self-pay | Admitting: Cardiology

## 2021-12-06 ENCOUNTER — Other Ambulatory Visit (HOSPITAL_COMMUNITY): Payer: Self-pay

## 2021-12-06 MED ORDER — SACUBITRIL-VALSARTAN 24-26 MG PO TABS: 1.0000 | ORAL_TABLET | Freq: Two times a day (BID) | ORAL | 0 refills | Status: DC

## 2021-12-19 ENCOUNTER — Other Ambulatory Visit (HOSPITAL_COMMUNITY): Payer: Self-pay

## 2021-12-20 ENCOUNTER — Other Ambulatory Visit: Payer: Self-pay

## 2021-12-22 ENCOUNTER — Other Ambulatory Visit: Payer: Self-pay | Admitting: *Deleted

## 2021-12-22 MED ORDER — ROSUVASTATIN CALCIUM 40 MG PO TABS: ORAL_TABLET | ORAL | 0 refills | Status: DC

## 2022-01-02 ENCOUNTER — Other Ambulatory Visit: Payer: Self-pay | Admitting: Internal Medicine

## 2022-01-12 ENCOUNTER — Other Ambulatory Visit (HOSPITAL_COMMUNITY): Payer: Self-pay | Admitting: Cardiology

## 2022-01-12 ENCOUNTER — Other Ambulatory Visit: Payer: Self-pay

## 2022-01-12 MED ORDER — ALBUTEROL SULFATE HFA 108 (90 BASE) MCG/ACT IN AERS: 2.0000 | INHALATION_SPRAY | Freq: Four times a day (QID) | RESPIRATORY_TRACT | 1 refills | Status: DC | PRN

## 2022-01-19 ENCOUNTER — Other Ambulatory Visit: Payer: Self-pay

## 2022-01-19 MED ORDER — ALBUTEROL SULFATE HFA 108 (90 BASE) MCG/ACT IN AERS: 2.0000 | INHALATION_SPRAY | Freq: Four times a day (QID) | RESPIRATORY_TRACT | 1 refills | Status: DC | PRN

## 2022-01-27 IMAGING — DX DG CHEST 2V
2 series · 2 of 2 positions shown · non-contrast
Comparison: Chest x-ray dated August 18, 2020. CT chest dated August 13, 2020.

CLINICAL DATA: Follow-up amiodarone toxicity.

EXAM:
CHEST - 2 VIEW

[chest pa]
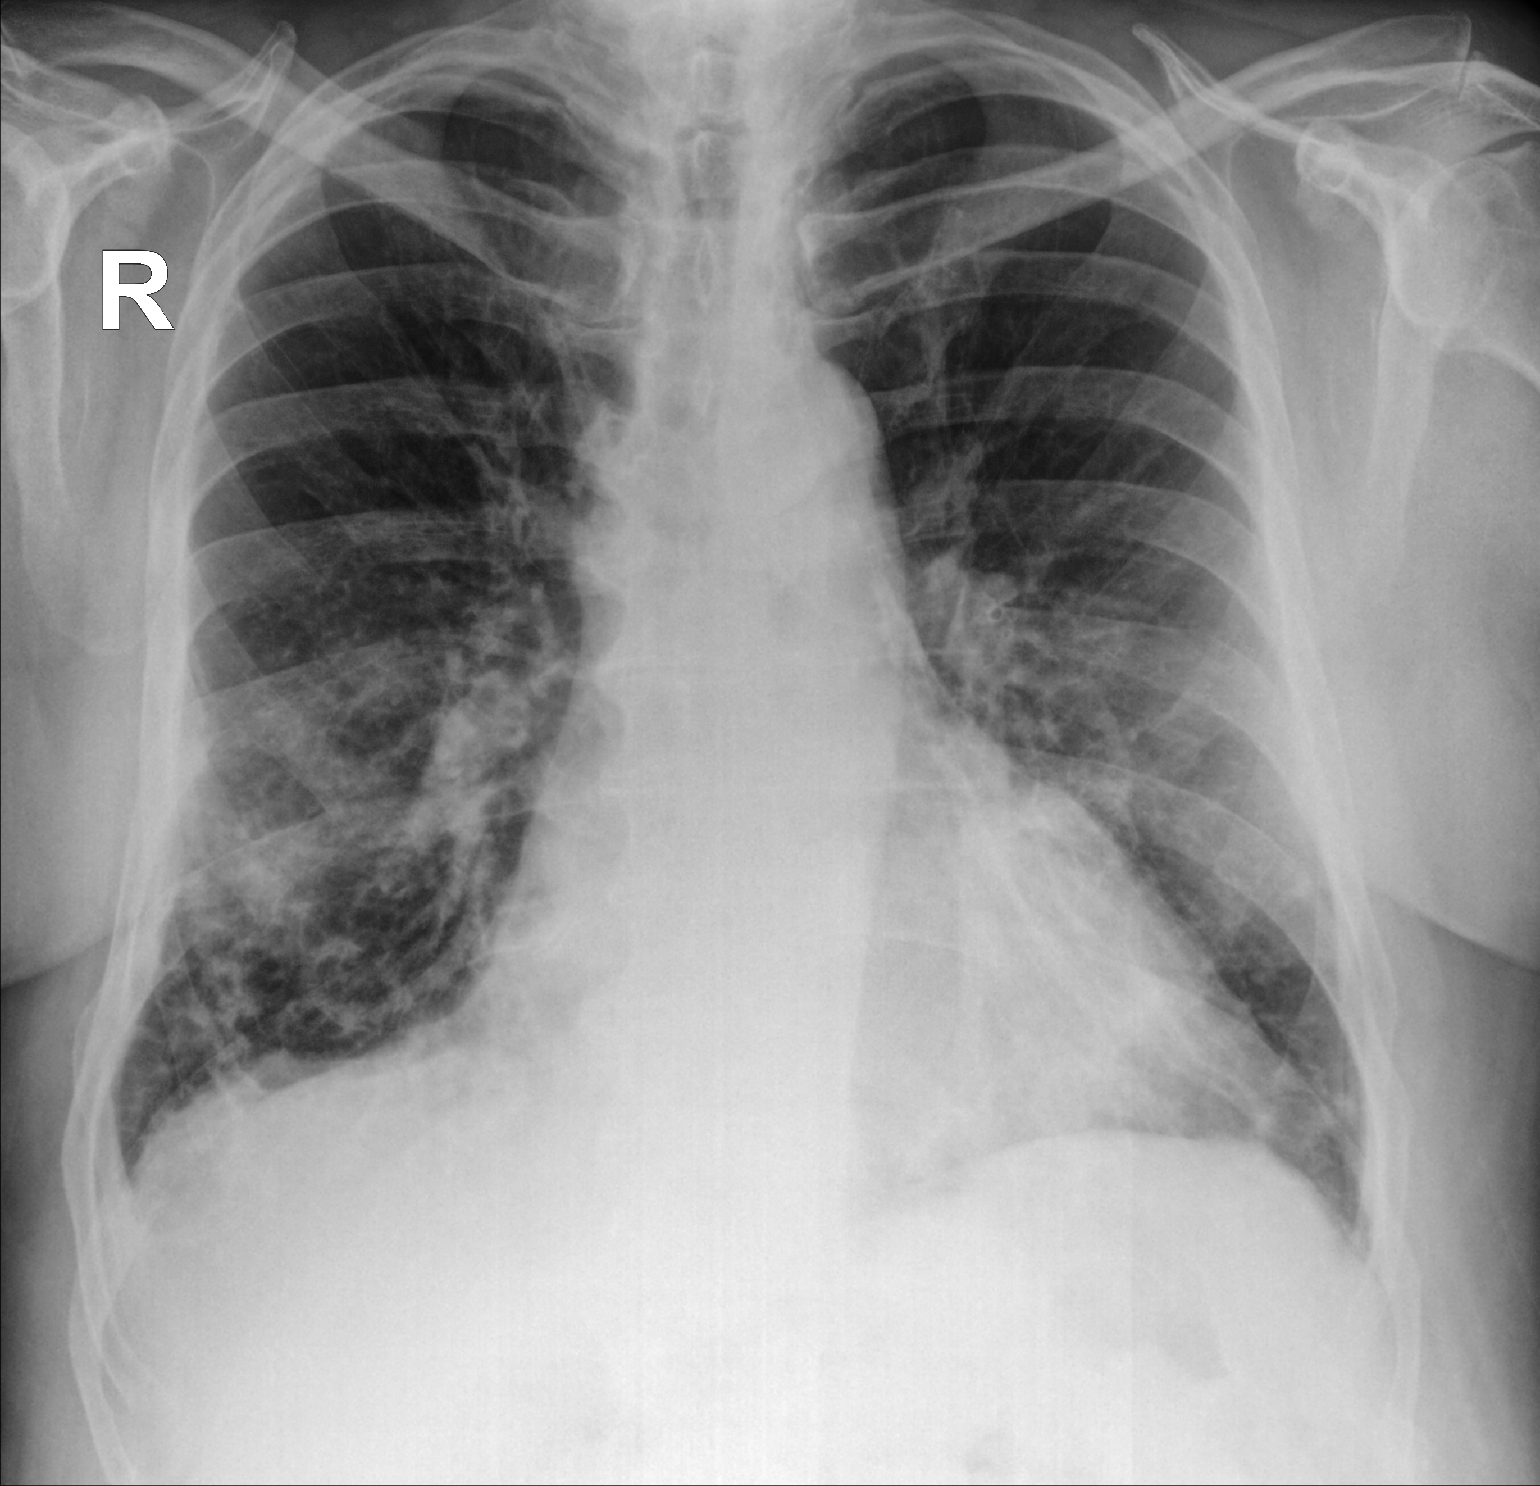

[chest lat]
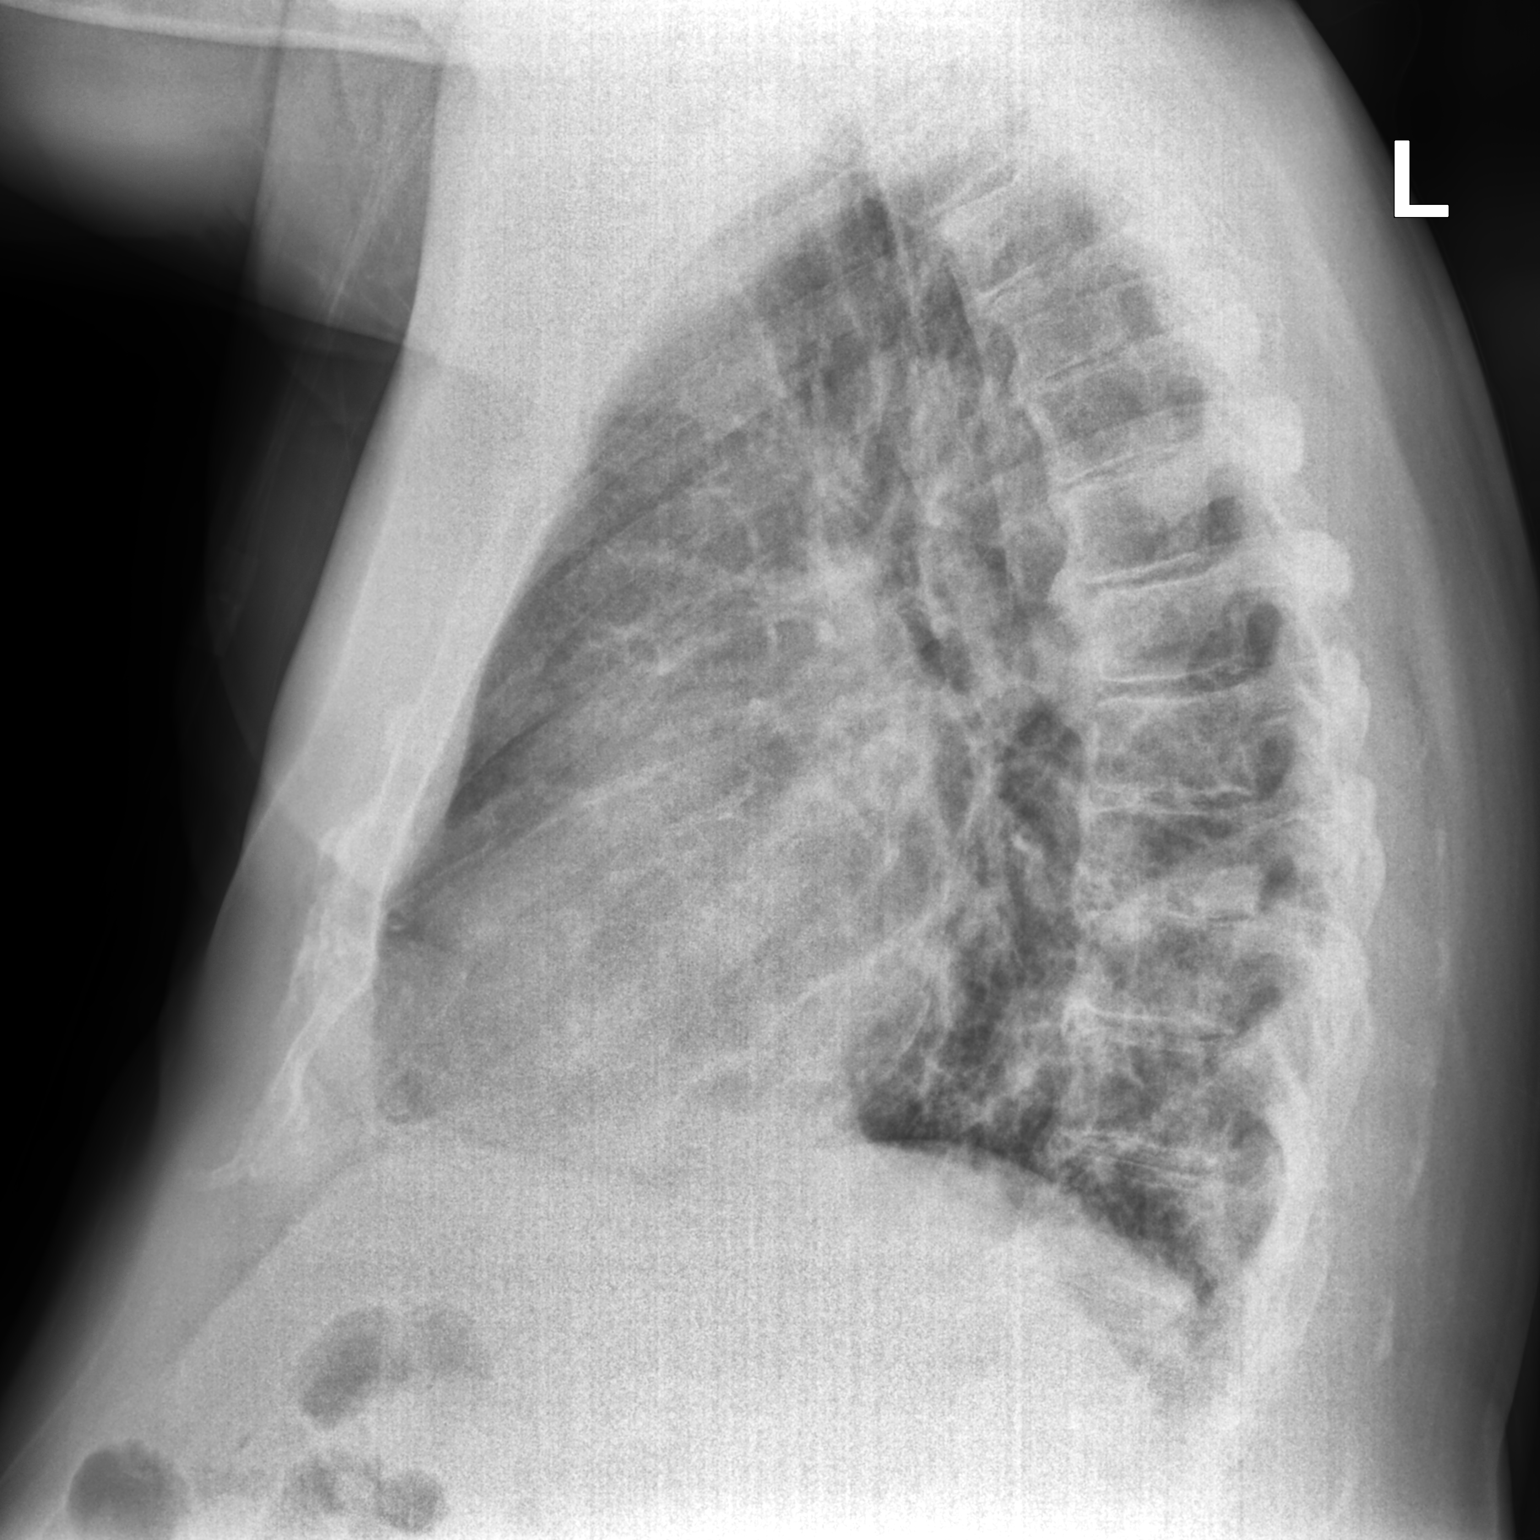

[2 of 2 positions shown; findings below may reference images not displayed]

FINDINGS: Unchanged mild cardiomegaly. Normal pulmonary vascularity. Continued
improvement in patchy bibasilar opacities. No pleural effusion or
pneumothorax. No acute osseous abnormality.
IMPRESSION: 1. Continued improvement in patchy bibasilar infiltrates.

## 2022-02-18 ENCOUNTER — Other Ambulatory Visit: Payer: Self-pay | Admitting: Family Medicine

## 2022-02-20 ENCOUNTER — Telehealth: Payer: Self-pay | Admitting: Licensed Clinical Social Worker

## 2022-02-20 NOTE — Patient Outreach (Signed)
  Care Coordination   02/20/2022 Name: David Huynh MRN: [id number] DOB: 11-27-1940   Care Coordination Outreach Attempts:  An unsuccessful telephone outreach was attempted today to offer the patient information about available care coordination services as a benefit of their health plan.   Follow Up Plan:  Additional outreach attempts will be made to offer the patient care coordination information and services.   Encounter Outcome:  No Answer  Care Coordination Interventions Activated:  No   Care Coordination Interventions:  No, not indicated    Casimer Lanius, [place] [phone]

## 2022-02-27 ENCOUNTER — Other Ambulatory Visit: Payer: Self-pay | Admitting: Family Medicine

## 2022-02-28 ENCOUNTER — Other Ambulatory Visit (HOSPITAL_COMMUNITY): Payer: Self-pay | Admitting: Family Medicine

## 2022-03-08 ENCOUNTER — Telehealth: Payer: Self-pay

## 2022-03-08 NOTE — Patient Outreach (Signed)
  Care Coordination   03/08/2022 Name: David Huynh MRN: [id number] DOB: 1941-04-14   Care Coordination Outreach Attempts:  A second unsuccessful outreach was attempted today to offer the patient with information about available care coordination services as a benefit of their health plan.     Follow Up Plan:  Additional outreach attempts will be made to offer the patient care coordination information and services.   Encounter Outcome:  No Answer  Care Coordination Interventions Activated:  No   Care Coordination Interventions:  No, not indicated    Thea Silversmith, RN, MSN, BSN, [place] Coordinator [phone]

## 2022-03-17 ENCOUNTER — Other Ambulatory Visit (HOSPITAL_COMMUNITY): Payer: Self-pay | Admitting: Family Medicine

## 2022-03-18 ENCOUNTER — Other Ambulatory Visit: Payer: Self-pay | Admitting: Internal Medicine

## 2022-03-20 ENCOUNTER — Other Ambulatory Visit (HOSPITAL_COMMUNITY): Payer: Self-pay | Admitting: Family Medicine

## 2022-03-28 ENCOUNTER — Ambulatory Visit: Payer: Medicare Other | Admitting: Internal Medicine

## 2022-04-26 ENCOUNTER — Other Ambulatory Visit: Payer: Self-pay

## 2022-04-26 ENCOUNTER — Emergency Department (HOSPITAL_COMMUNITY): Payer: Medicare Other

## 2022-04-26 ENCOUNTER — Observation Stay (HOSPITAL_COMMUNITY): Payer: Medicare Other

## 2022-04-26 ENCOUNTER — Encounter (HOSPITAL_COMMUNITY): Payer: Self-pay

## 2022-04-26 ENCOUNTER — Inpatient Hospital Stay (HOSPITAL_COMMUNITY)
Admission: EM | Admit: 2022-04-26 | Discharge: 2022-05-08 | DRG: 291 | Disposition: A | Discharge status: Skilled Nursing Facility | Payer: Medicare Other | Attending: Internal Medicine | Admitting: Internal Medicine

## 2022-04-26 ENCOUNTER — Observation Stay: Payer: Self-pay

## 2022-04-26 ENCOUNTER — Observation Stay (HOSPITAL_BASED_OUTPATIENT_CLINIC_OR_DEPARTMENT_OTHER): Payer: Medicare Other

## 2022-04-26 DIAGNOSIS — I484 Atypical atrial flutter: Secondary | ICD-10-CM | POA: Diagnosis present

## 2022-04-26 DIAGNOSIS — I2510 Atherosclerotic heart disease of native coronary artery without angina pectoris: Secondary | ICD-10-CM | POA: Diagnosis present

## 2022-04-26 DIAGNOSIS — I080 Rheumatic disorders of both mitral and aortic valves: Secondary | ICD-10-CM | POA: Diagnosis present

## 2022-04-26 DIAGNOSIS — R531 Weakness: Secondary | ICD-10-CM | POA: Diagnosis not present

## 2022-04-26 DIAGNOSIS — Z7952 Long term (current) use of systemic steroids: Secondary | ICD-10-CM

## 2022-04-26 DIAGNOSIS — N320 Bladder-neck obstruction: Secondary | ICD-10-CM | POA: Diagnosis present

## 2022-04-26 DIAGNOSIS — I10 Essential (primary) hypertension: Secondary | ICD-10-CM | POA: Diagnosis present

## 2022-04-26 DIAGNOSIS — I471 Supraventricular tachycardia, unspecified: Secondary | ICD-10-CM | POA: Diagnosis not present

## 2022-04-26 DIAGNOSIS — J9601 Acute respiratory failure with hypoxia: Secondary | ICD-10-CM | POA: Diagnosis not present

## 2022-04-26 DIAGNOSIS — I255 Ischemic cardiomyopathy: Secondary | ICD-10-CM | POA: Diagnosis present

## 2022-04-26 DIAGNOSIS — E569 Vitamin deficiency, unspecified: Secondary | ICD-10-CM | POA: Diagnosis not present

## 2022-04-26 DIAGNOSIS — E1122 Type 2 diabetes mellitus with diabetic chronic kidney disease: Secondary | ICD-10-CM | POA: Diagnosis present

## 2022-04-26 DIAGNOSIS — I4891 Unspecified atrial fibrillation: Secondary | ICD-10-CM | POA: Diagnosis not present

## 2022-04-26 DIAGNOSIS — I509 Heart failure, unspecified: Principal | ICD-10-CM

## 2022-04-26 DIAGNOSIS — E8779 Other fluid overload: Secondary | ICD-10-CM | POA: Diagnosis not present

## 2022-04-26 DIAGNOSIS — J101 Influenza due to other identified influenza virus with other respiratory manifestations: Secondary | ICD-10-CM | POA: Diagnosis not present

## 2022-04-26 DIAGNOSIS — E669 Obesity, unspecified: Secondary | ICD-10-CM | POA: Diagnosis present

## 2022-04-26 DIAGNOSIS — E785 Hyperlipidemia, unspecified: Secondary | ICD-10-CM | POA: Diagnosis not present

## 2022-04-26 DIAGNOSIS — N3000 Acute cystitis without hematuria: Secondary | ICD-10-CM | POA: Diagnosis not present

## 2022-04-26 DIAGNOSIS — Z66 Do not resuscitate: Secondary | ICD-10-CM | POA: Diagnosis not present

## 2022-04-26 DIAGNOSIS — I5023 Acute on chronic systolic (congestive) heart failure: Secondary | ICD-10-CM

## 2022-04-26 DIAGNOSIS — Z955 Presence of coronary angioplasty implant and graft: Secondary | ICD-10-CM

## 2022-04-26 DIAGNOSIS — I130 Hypertensive heart and chronic kidney disease with heart failure and stage 1 through stage 4 chronic kidney disease, or unspecified chronic kidney disease: Secondary | ICD-10-CM | POA: Diagnosis not present

## 2022-04-26 DIAGNOSIS — R0600 Dyspnea, unspecified: Secondary | ICD-10-CM | POA: Diagnosis not present

## 2022-04-26 DIAGNOSIS — Z794 Long term (current) use of insulin: Secondary | ICD-10-CM | POA: Diagnosis not present

## 2022-04-26 DIAGNOSIS — N1831 Chronic kidney disease, stage 3a: Secondary | ICD-10-CM

## 2022-04-26 DIAGNOSIS — I4819 Other persistent atrial fibrillation: Secondary | ICD-10-CM | POA: Diagnosis present

## 2022-04-26 DIAGNOSIS — D6869 Other thrombophilia: Secondary | ICD-10-CM | POA: Diagnosis not present

## 2022-04-26 DIAGNOSIS — R000 Tachycardia, unspecified: Secondary | ICD-10-CM | POA: Diagnosis not present

## 2022-04-26 DIAGNOSIS — E1165 Type 2 diabetes mellitus with hyperglycemia: Secondary | ICD-10-CM | POA: Diagnosis present

## 2022-04-26 DIAGNOSIS — J9811 Atelectasis: Secondary | ICD-10-CM | POA: Diagnosis present

## 2022-04-26 DIAGNOSIS — J810 Acute pulmonary edema: Secondary | ICD-10-CM

## 2022-04-26 DIAGNOSIS — I5043 Acute on chronic combined systolic (congestive) and diastolic (congestive) heart failure: Secondary | ICD-10-CM | POA: Diagnosis present

## 2022-04-26 DIAGNOSIS — Z7984 Long term (current) use of oral hypoglycemic drugs: Secondary | ICD-10-CM

## 2022-04-26 DIAGNOSIS — R918 Other nonspecific abnormal finding of lung field: Secondary | ICD-10-CM | POA: Diagnosis not present

## 2022-04-26 DIAGNOSIS — B9620 Unspecified Escherichia coli [E. coli] as the cause of diseases classified elsewhere: Secondary | ICD-10-CM | POA: Diagnosis present

## 2022-04-26 DIAGNOSIS — N049 Nephrotic syndrome with unspecified morphologic changes: Secondary | ICD-10-CM | POA: Diagnosis not present

## 2022-04-26 DIAGNOSIS — Z1152 Encounter for screening for COVID-19: Secondary | ICD-10-CM

## 2022-04-26 DIAGNOSIS — Z515 Encounter for palliative care: Secondary | ICD-10-CM

## 2022-04-26 DIAGNOSIS — J811 Chronic pulmonary edema: Secondary | ICD-10-CM | POA: Diagnosis not present

## 2022-04-26 DIAGNOSIS — I5030 Unspecified diastolic (congestive) heart failure: Secondary | ICD-10-CM | POA: Diagnosis not present

## 2022-04-26 DIAGNOSIS — J4530 Mild persistent asthma, uncomplicated: Secondary | ICD-10-CM | POA: Diagnosis present

## 2022-04-26 DIAGNOSIS — Z7189 Other specified counseling: Secondary | ICD-10-CM

## 2022-04-26 DIAGNOSIS — R2681 Unsteadiness on feet: Secondary | ICD-10-CM | POA: Diagnosis not present

## 2022-04-26 DIAGNOSIS — R0682 Tachypnea, not elsewhere classified: Secondary | ICD-10-CM | POA: Diagnosis not present

## 2022-04-26 DIAGNOSIS — I350 Nonrheumatic aortic (valve) stenosis: Secondary | ICD-10-CM | POA: Diagnosis not present

## 2022-04-26 DIAGNOSIS — M6281 Muscle weakness (generalized): Secondary | ICD-10-CM | POA: Diagnosis not present

## 2022-04-26 DIAGNOSIS — R0902 Hypoxemia: Secondary | ICD-10-CM | POA: Diagnosis not present

## 2022-04-26 DIAGNOSIS — N179 Acute kidney failure, unspecified: Secondary | ICD-10-CM | POA: Diagnosis present

## 2022-04-26 DIAGNOSIS — Z7901 Long term (current) use of anticoagulants: Secondary | ICD-10-CM

## 2022-04-26 DIAGNOSIS — Z888 Allergy status to other drugs, medicaments and biological substances status: Secondary | ICD-10-CM

## 2022-04-26 DIAGNOSIS — I129 Hypertensive chronic kidney disease with stage 1 through stage 4 chronic kidney disease, or unspecified chronic kidney disease: Secondary | ICD-10-CM | POA: Diagnosis not present

## 2022-04-26 DIAGNOSIS — E1142 Type 2 diabetes mellitus with diabetic polyneuropathy: Secondary | ICD-10-CM | POA: Diagnosis not present

## 2022-04-26 DIAGNOSIS — Z91148 Patient's other noncompliance with medication regimen for other reason: Secondary | ICD-10-CM

## 2022-04-26 DIAGNOSIS — D649 Anemia, unspecified: Secondary | ICD-10-CM | POA: Diagnosis not present

## 2022-04-26 DIAGNOSIS — N1832 Chronic kidney disease, stage 3b: Secondary | ICD-10-CM | POA: Diagnosis not present

## 2022-04-26 DIAGNOSIS — J8410 Pulmonary fibrosis, unspecified: Secondary | ICD-10-CM | POA: Diagnosis not present

## 2022-04-26 DIAGNOSIS — N139 Obstructive and reflux uropathy, unspecified: Secondary | ICD-10-CM | POA: Diagnosis present

## 2022-04-26 DIAGNOSIS — I4892 Unspecified atrial flutter: Secondary | ICD-10-CM | POA: Diagnosis not present

## 2022-04-26 DIAGNOSIS — J309 Allergic rhinitis, unspecified: Secondary | ICD-10-CM | POA: Diagnosis not present

## 2022-04-26 DIAGNOSIS — R339 Retention of urine, unspecified: Secondary | ICD-10-CM | POA: Diagnosis not present

## 2022-04-26 DIAGNOSIS — J90 Pleural effusion, not elsewhere classified: Secondary | ICD-10-CM | POA: Insufficient documentation

## 2022-04-26 DIAGNOSIS — R55 Syncope and collapse: Secondary | ICD-10-CM | POA: Diagnosis not present

## 2022-04-26 DIAGNOSIS — R609 Edema, unspecified: Secondary | ICD-10-CM | POA: Diagnosis not present

## 2022-04-26 DIAGNOSIS — W19XXXA Unspecified fall, initial encounter: Secondary | ICD-10-CM | POA: Diagnosis not present

## 2022-04-26 DIAGNOSIS — E8809 Other disorders of plasma-protein metabolism, not elsewhere classified: Secondary | ICD-10-CM | POA: Insufficient documentation

## 2022-04-26 DIAGNOSIS — N281 Cyst of kidney, acquired: Secondary | ICD-10-CM | POA: Diagnosis not present

## 2022-04-26 DIAGNOSIS — R627 Adult failure to thrive: Secondary | ICD-10-CM | POA: Diagnosis present

## 2022-04-26 DIAGNOSIS — Z7401 Bed confinement status: Secondary | ICD-10-CM | POA: Diagnosis not present

## 2022-04-26 DIAGNOSIS — N189 Chronic kidney disease, unspecified: Secondary | ICD-10-CM | POA: Diagnosis present

## 2022-04-26 DIAGNOSIS — Z6839 Body mass index (BMI) 39.0-39.9, adult: Secondary | ICD-10-CM

## 2022-04-26 DIAGNOSIS — I4719 Other supraventricular tachycardia: Secondary | ICD-10-CM | POA: Diagnosis not present

## 2022-04-26 DIAGNOSIS — I110 Hypertensive heart disease with heart failure: Secondary | ICD-10-CM | POA: Diagnosis not present

## 2022-04-26 DIAGNOSIS — R2689 Other abnormalities of gait and mobility: Secondary | ICD-10-CM | POA: Diagnosis not present

## 2022-04-26 DIAGNOSIS — R0602 Shortness of breath: Secondary | ICD-10-CM | POA: Diagnosis not present

## 2022-04-26 DIAGNOSIS — Z79899 Other long term (current) drug therapy: Secondary | ICD-10-CM

## 2022-04-26 LAB — BASIC METABOLIC PANEL
Anion gap: 8 (ref 5–15)
BUN: 42 mg/dL — ABNORMAL HIGH (ref 8–23)
CO2: 19 mmol/L — ABNORMAL LOW (ref 22–32)
Calcium: 8.2 mg/dL — ABNORMAL LOW (ref 8.9–10.3)
Chloride: 111 mmol/L (ref 98–111)
Creatinine, Ser: 2.48 mg/dL — ABNORMAL HIGH (ref 0.61–1.24)
GFR, Estimated: 26 mL/min — ABNORMAL LOW (ref >60)
Glucose, Bld: 90 mg/dL (ref 70–99)
Potassium: 4.1 mmol/L (ref 3.5–5.1)
Sodium: 138 mmol/L (ref 135–145)

## 2022-04-26 LAB — URINALYSIS, COMPLETE (UACMP) WITH MICROSCOPIC: RBC / HPF: >50 RBC/hpf (ref 0–5)

## 2022-04-26 LAB — HEPATIC FUNCTION PANEL
ALT: 10 U/L (ref 0–44)
AST: 16 U/L (ref 15–41)
Albumin: 1.8 g/dL — ABNORMAL LOW (ref 3.5–5.0)
Alkaline Phosphatase: 63 U/L (ref 38–126)
Bilirubin, Direct: 0.1 mg/dL (ref 0.0–0.2)
Indirect Bilirubin: 0.6 mg/dL (ref 0.3–0.9)
Total Bilirubin: 0.7 mg/dL (ref 0.3–1.2)
Total Protein: 5.7 g/dL — ABNORMAL LOW (ref 6.5–8.1)

## 2022-04-26 LAB — RESP PANEL BY RT-PCR (RSV, FLU A&B, COVID)  RVPGX2
Influenza A by PCR: NEGATIVE
Influenza B by PCR: NEGATIVE
Resp Syncytial Virus by PCR: NEGATIVE
SARS Coronavirus 2 by RT PCR: NEGATIVE

## 2022-04-26 LAB — CBC
HCT: 45.8 % (ref 39.0–52.0)
Hemoglobin: 14.5 g/dL (ref 13.0–17.0)
MCH: 29.3 pg (ref 26.0–34.0)
MCHC: 31.7 g/dL (ref 30.0–36.0)
MCV: 92.5 fL (ref 80.0–100.0)
Platelets: 340 10*3/uL (ref 150–400)
RBC: 4.95 MIL/uL (ref 4.22–5.81)
RDW: 16.3 % — ABNORMAL HIGH (ref 11.5–15.5)
WBC: 9.9 10*3/uL (ref 4.0–10.5)
nRBC: 0 % (ref 0.0–0.2)

## 2022-04-26 LAB — ECHOCARDIOGRAM COMPLETE
AR max vel: 1.42 cm2
AV Area VTI: 1.5 cm2
AV Area mean vel: 1.39 cm2
AV Mean grad: 17 mmHg
AV Peak grad: 24.1 mmHg
Ao pk vel: 2.45 m/s
Area-P 1/2: 8.25 cm2
Calc EF: 30.2 %
Height: 72 in
S' Lateral: 4.6 cm
Single Plane A2C EF: 38.8 %
Single Plane A4C EF: 29.9 %
Weight: 4384.51 oz

## 2022-04-26 LAB — CBG MONITORING, ED
Glucose-Capillary: 96 mg/dL (ref 70–99)
Glucose-Capillary: 89 mg/dL (ref 70–99)
Glucose-Capillary: 88 mg/dL (ref 70–99)

## 2022-04-26 LAB — GLUCOSE, CAPILLARY
Glucose-Capillary: 97 mg/dL (ref 70–99)
Glucose-Capillary: 84 mg/dL (ref 70–99)

## 2022-04-26 LAB — HEMOGLOBIN A1C
Hgb A1c MFr Bld: 5.8 % — ABNORMAL HIGH (ref 4.8–5.6)
Mean Plasma Glucose: 120 mg/dL

## 2022-04-26 LAB — TROPONIN I (HIGH SENSITIVITY)
Troponin I (High Sensitivity): 84 ng/L — ABNORMAL HIGH (ref <18)
Troponin I (High Sensitivity): 98 ng/L — ABNORMAL HIGH (ref <18)

## 2022-04-26 LAB — BRAIN NATRIURETIC PEPTIDE: B Natriuretic Peptide: 1368.4 pg/mL — ABNORMAL HIGH (ref 0.0–100.0)

## 2022-04-26 MED ORDER — SODIUM CHLORIDE 0.9 % IV SOLN
250.0000 mL | INTRAVENOUS | Status: DC | PRN
  Administered 2022-05-02: 250 mL via INTRAVENOUS

## 2022-04-26 MED ORDER — SODIUM CHLORIDE 0.9% FLUSH: 10.0000 mL | INTRAVENOUS | Status: DC | PRN

## 2022-04-26 MED ORDER — APIXABAN 2.5 MG PO TABS
2.5000 mg | ORAL_TABLET | Freq: Two times a day (BID) | ORAL | Status: DC
  Administered 2022-04-26 – 2022-05-08 (×24): 2.5 mg via ORAL
  Filled 2022-04-26 (×24): qty 1

## 2022-04-26 MED ORDER — SODIUM CHLORIDE 0.9% FLUSH
3.0000 mL | INTRAVENOUS | Status: DC | PRN
  Administered 2022-04-28 – 2022-05-01 (×3): 3 mL via INTRAVENOUS

## 2022-04-26 MED ORDER — INSULIN ASPART 100 UNIT/ML IJ SOLN
0.0000 [IU] | INTRAMUSCULAR | Status: DC
  Administered 2022-04-27: 1 [IU] via SUBCUTANEOUS

## 2022-04-26 MED ORDER — NITROGLYCERIN 2 % TD OINT
1.0000 [in_us] | TOPICAL_OINTMENT | Freq: Once | TRANSDERMAL | Status: AC
  Administered 2022-04-26: 1 [in_us] via TOPICAL
  Filled 2022-04-26: qty 1

## 2022-04-26 MED ORDER — ONDANSETRON HCL 4 MG/2ML IJ SOLN: 4.0000 mg | Freq: Four times a day (QID) | INTRAMUSCULAR | Status: DC | PRN

## 2022-04-26 MED ORDER — PERFLUTREN LIPID MICROSPHERE
1.0000 mL | INTRAVENOUS | Status: AC | PRN
  Administered 2022-04-26: 2 mL via INTRAVENOUS

## 2022-04-26 MED ORDER — FUROSEMIDE 10 MG/ML IJ SOLN
100.0000 mg | Freq: Two times a day (BID) | INTRAVENOUS | Status: DC
  Administered 2022-04-26: 100 mg via INTRAVENOUS
  Filled 2022-04-26 (×2): qty 10

## 2022-04-26 MED ORDER — FUROSEMIDE 10 MG/ML IJ SOLN
15.0000 mg/h | INTRAVENOUS | Status: DC
  Administered 2022-04-26 – 2022-04-30 (×7): 15 mg/h via INTRAVENOUS
  Filled 2022-04-26 (×11): qty 20

## 2022-04-26 MED ORDER — ROSUVASTATIN CALCIUM 20 MG PO TABS
40.0000 mg | ORAL_TABLET | Freq: Every day | ORAL | Status: DC
  Administered 2022-04-27 – 2022-05-03 (×7): 40 mg via ORAL
  Filled 2022-04-26 (×7): qty 2

## 2022-04-26 MED ORDER — ACETAMINOPHEN 325 MG PO TABS
650.0000 mg | ORAL_TABLET | ORAL | Status: DC | PRN
  Administered 2022-04-30: 650 mg via ORAL
  Filled 2022-04-26: qty 2

## 2022-04-26 MED ORDER — METOPROLOL TARTRATE 25 MG PO TABS
12.5000 mg | ORAL_TABLET | Freq: Four times a day (QID) | ORAL | Status: DC
  Administered 2022-04-26: 12.5 mg via ORAL
  Filled 2022-04-26: qty 1

## 2022-04-26 MED ORDER — ISOSORBIDE MONONITRATE ER 30 MG PO TB24
15.0000 mg | ORAL_TABLET | Freq: Every day | ORAL | Status: DC
  Administered 2022-04-27 – 2022-05-02 (×6): 15 mg via ORAL
  Filled 2022-04-26 (×6): qty 1

## 2022-04-26 MED ORDER — SODIUM CHLORIDE 0.9% FLUSH
3.0000 mL | Freq: Two times a day (BID) | INTRAVENOUS | Status: DC
  Administered 2022-04-27 – 2022-05-07 (×11): 3 mL via INTRAVENOUS

## 2022-04-26 MED ORDER — SODIUM CHLORIDE 0.9% FLUSH
10.0000 mL | Freq: Two times a day (BID) | INTRAVENOUS | Status: DC
  Administered 2022-04-27: 20 mL
  Administered 2022-04-27 – 2022-04-29 (×3): 10 mL
  Administered 2022-04-30: 20 mL
  Administered 2022-05-01 – 2022-05-03 (×5): 10 mL
  Administered 2022-05-03: 40 mL
  Administered 2022-05-04: 10 mL
  Administered 2022-05-04: 40 mL
  Administered 2022-05-05 – 2022-05-07 (×6): 10 mL

## 2022-04-26 MED ORDER — HYDRALAZINE HCL 25 MG PO TABS
12.5000 mg | ORAL_TABLET | Freq: Three times a day (TID) | ORAL | Status: DC
  Administered 2022-04-26 – 2022-05-02 (×17): 12.5 mg via ORAL
  Filled 2022-04-26 (×18): qty 1

## 2022-04-26 MED ORDER — METOPROLOL SUCCINATE ER 25 MG PO TB24
25.0000 mg | ORAL_TABLET | Freq: Two times a day (BID) | ORAL | Status: DC
  Administered 2022-04-26: 25 mg via ORAL
  Filled 2022-04-26: qty 1

## 2022-04-26 MED ORDER — LABETALOL HCL 5 MG/ML IV SOLN
10.0000 mg | Freq: Four times a day (QID) | INTRAVENOUS | Status: DC | PRN
  Administered 2022-04-26: 10 mg via INTRAVENOUS
  Filled 2022-04-26: qty 4

## 2022-04-26 MED ORDER — HYDRALAZINE HCL 20 MG/ML IJ SOLN: 10.0000 mg | Freq: Four times a day (QID) | INTRAMUSCULAR | Status: DC | PRN

## 2022-04-26 MED ORDER — FUROSEMIDE 10 MG/ML IJ SOLN
60.0000 mg | Freq: Once | INTRAMUSCULAR | Status: AC
  Administered 2022-04-26: 60 mg via INTRAVENOUS
  Filled 2022-04-26: qty 6

## 2022-04-26 MED ORDER — CHLORHEXIDINE GLUCONATE CLOTH 2 % EX PADS
6.0000 | MEDICATED_PAD | Freq: Every day | CUTANEOUS | Status: DC
  Administered 2022-04-27 – 2022-05-07 (×11): 6 via TOPICAL

## 2022-04-26 MED ORDER — APIXABAN 5 MG PO TABS: 5.0000 mg | ORAL_TABLET | Freq: Two times a day (BID) | ORAL | Status: DC

## 2022-04-26 NOTE — Assessment & Plan Note (Addendum)
Chronic kidney disease stage IIIa baseline 1.6, was 2.5 mg/dL on admission, no improvement with decongestion.  Agree with Pulmonology, ?nephrotic syndrome. - Outpateint nephrology follow up

## 2022-04-26 NOTE — Assessment & Plan Note (Addendum)
Defer to Cardiology

## 2022-04-26 NOTE — Progress Notes (Signed)
  Echocardiogram 2D Echocardiogram has been performed.  Janalyn Harder 04/26/2022, 11:03 AM

## 2022-04-26 NOTE — Consult Note (Addendum)
ELECTROPHYSIOLOGY CONSULT NOTE    Patient ID: David Huynh MRN: [id number], DOB/AGE: 80/25/1942 81 y.o.  Admit date: 04/26/2022 Date of Consult: 04/26/2022  Primary Physician: [name], MD Primary Cardiologist: Loralie Champagne, MD  Electrophysiologist: Dr. Rayann Heman -> Dr. Myles Gip   Referring Provider: Dr. Aundra Dubin  Patient Profile: David Huynh is a 81 y.o. male with a history of CAD, CHF, HTN, HLD, DM2, CKD 3, Asthma, and PAF s/p PVI 10/2020 who is being seen today for the evaluation of AF RVR at the request of Dr. Aundra Dubin.  HPI:  David Huynh is a 82 y.o. male with medical history as above.   Last seen by Dr. Rayann Heman 01/2021 3 months s/p PVI. EF 12/28/2020 showed some improvement to 45-50%  Pt presented to [facility] this am with complains of worsening SOB and edema x 3 weeks. Denies recent illness, fever, or cough. Has had worsening DOE and orthopnea as well. BNP elevated and labs as below on arrival. Noted to be in AF RVR on arrival. Started on high dose IV lasix.   HF team consulted, who additionally asked EP to consult due to complicated AF picture given h/o ablation and amio toxicity.   Potassium4.1 (12/13 0100) Creatinine, ser  2.48* (12/13 0100) PLT  340 (12/13 0100) HGB  14.5 (12/13 0100) WBC 9.9 (12/13 0100) Troponin I (High Sensitivity)98* (12/13 0306).    Pt is currently SOB with conversation and remains on BiPAP. Urinal with dark urine at bedside. Not on O2 at home. Understands his AF options are currently limited in the setting of significant acute illness.  He's not sure what all medications he has had at home. Last seen by cardiology November of last year.    Past Medical History:  Diagnosis Date   Allergy    seasonal   Asthma    mild, intermittent   Atrial fibrillation with rapid ventricular response ([place]) 03/25/2018   CAD, multiple vessel    Three-vessel disease involving mid RCA 85% (PCI with DES), OM 2 80% in branch and ostD1 ~90%. Only the mid  RCA with PCI target.    Carotid bruit 08/05/2010   Bruit noted on left    Chronic combined systolic and diastolic CHF (congestive heart failure) (HCC)    CKD (chronic kidney disease), stage III ([place])    Diabetes mellitus    type 2   Fracture of multiple ribs 02/12/2015   Hemothorax on right 01/23/2015   Hyperlipidemia    Hypertension    Hypocalcemia 02/12/2015   Lactic acidosis 03/25/2018   Liver function study, abnormal 04/26/2011   Multiple rib fractures    Obesity    Persistent atrial fibrillation ([place])    Pneumonia      Surgical History:  Past Surgical History:  Procedure Laterality Date   APPENDECTOMY  1975   ATRIAL FIBRILLATION ABLATION N/A 10/26/2020   Procedure: ATRIAL FIBRILLATION ABLATION;  Surgeon: Thompson Grayer, MD;  Location: [place] [facility];  Service: Cardiovascular;  Laterality: N/A;   CARDIAC CATHETERIZATION N/A 02/17/2016   Procedure: Left Heart Cath and Coronary Angiography;  Surgeon: Leonie Man, MD;  Location: [place] [facility];  Service: Cardiovascular: mRCA 85% (PCI), branch of OM2 80%,  ostD1 90%; EF 35-45%   CARDIAC CATHETERIZATION N/A 02/17/2016   Procedure: Coronary Stent Intervention;  Surgeon: Leonie Man, MD;  Location: [place] [facility];  Service: Cardiovascular: mRCA PCI : Ronna Polio PREM MR 2.2G25 DES   CARDIOVERSION N/A 03/29/2018   Procedure:  CARDIOVERSION;  Surgeon: Larey Dresser, MD;  Location: [facility] ENDOSCOPY;  Service: Cardiovascular;  Laterality: N/A;   CARDIOVERSION N/A 04/02/2018   Procedure: CARDIOVERSION;  Surgeon: Larey Dresser, MD;  Location: [facility] ENDOSCOPY;  Service: Cardiovascular;  Laterality: N/A;   CARDIOVERSION N/A 04/19/2018   Procedure: CARDIOVERSION;  Surgeon: Larey Dresser, MD;  Location: [facility] ENDOSCOPY;  Service: Cardiovascular;  Laterality: N/A;   CARDIOVERSION N/A 08/11/2020   Procedure: CARDIOVERSION;  Surgeon: Larey Dresser, MD;  Location: [facility] ENDOSCOPY;  Service: Cardiovascular;  Laterality: N/A;    CARDIOVERSION N/A 12/28/2020   Procedure: CARDIOVERSION;  Surgeon: Larey Dresser, MD;  Location: [facility] ENDOSCOPY;  Service: Cardiovascular;  Laterality: N/A;   CATARACT EXTRACTION  2010   b/l   CORONARY STENT INTERVENTION N/A 04/03/2018   Procedure: CORONARY STENT INTERVENTION;  Surgeon: Troy Sine, MD;  Location: [place] [facility];  Service: Cardiovascular;  Laterality: N/A;   CORONARY STENT INTERVENTION N/A 03/02/2020   Procedure: CORONARY STENT INTERVENTION;  Surgeon: Jettie Booze, MD;  Location: [place] [facility];  Service: Cardiovascular;  Laterality: N/A;   INTRAVASCULAR ULTRASOUND/IVUS N/A 03/02/2020   Procedure: Intravascular Ultrasound/IVUS;  Surgeon: Jettie Booze, MD;  Location: [place] [facility];  Service: Cardiovascular;  Laterality: N/A;   RETINAL LASER PROCEDURE  1985   RIGHT/LEFT HEART CATH AND CORONARY ANGIOGRAPHY N/A 04/03/2018   Procedure: RIGHT/LEFT HEART CATH AND CORONARY ANGIOGRAPHY;  Surgeon: Larey Dresser, MD;  Location: [place] [facility];  Service: Cardiovascular;  Laterality: N/A;   RIGHT/LEFT HEART CATH AND CORONARY ANGIOGRAPHY N/A 03/02/2020   Procedure: RIGHT/LEFT HEART CATH AND CORONARY ANGIOGRAPHY;  Surgeon: Larey Dresser, MD;  Location: [place] [facility];  Service: Cardiovascular;  Laterality: N/A;   TEE WITHOUT CARDIOVERSION N/A 03/29/2018   Procedure: TRANSESOPHAGEAL ECHOCARDIOGRAM (TEE);  Surgeon: Larey Dresser, MD;  Location: [facility] ENDOSCOPY;  Service: Cardiovascular;  Laterality: N/A;   TEE WITHOUT CARDIOVERSION N/A 04/19/2018   Procedure: TRANSESOPHAGEAL ECHOCARDIOGRAM (TEE);  Surgeon: Larey Dresser, MD;  Location: [facility] ENDOSCOPY;  Service: Cardiovascular;  Laterality: N/A;   TEE WITHOUT CARDIOVERSION N/A 03/02/2020   Procedure: TRANSESOPHAGEAL ECHOCARDIOGRAM (TEE);  Surgeon: Larey Dresser, MD;  Location: [facility] ENDOSCOPY;  Service: Cardiovascular;  Laterality: N/A;   TEE WITHOUT CARDIOVERSION N/A 10/26/2020   Procedure:  TRANSESOPHAGEAL ECHOCARDIOGRAM (TEE);  Surgeon: Acie Fredrickson Wonda Cheng, MD;  Location: [place];  Service: Cardiovascular;  Laterality: N/A;   [place] ECHOCARDIOGRAM  02/2016   Mild LVH. Moderately reduced EF of 35-40% with diffuse hypokinesis. Indeterminate diastolic function. Likely elevated LA pressures. Mild aortic stenosis (mean gradient 10 mmHg). Mild MR. Mild LA dilation. Mild to moderate PA hypertension with 39 mmHg   VIDEO ASSISTED THORACOSCOPY (VATS)/THOROCOTOMY Right 01/24/2015   Procedure: VIDEO ASSISTED THORACOSCOPY (VATS) FOR DRAINAGE OF RIGHT HEMOTHORAX;  Surgeon: Gaye Pollack, MD;  Location: [place] [facility];  Service: Thoracic;  Laterality: Right;     (Not in a hospital admission)   Inpatient Medications:   apixaban  2.5 mg Oral BID   hydrALAZINE  12.5 mg Oral Q8H   insulin aspart  0-9 Units Subcutaneous Q4H   insulin starter kit- pen needles  1 kit Other Once   isosorbide mononitrate  15 mg Oral Daily   metoprolol succinate  25 mg Oral BID   rosuvastatin  40 mg Oral Daily   sodium chloride flush  3 mL Intravenous Q12H    Allergies:  Allergies  Allergen Reactions  Amiodarone Shortness Of Breath    Pulmonary toxicity    Social History   Socioeconomic History   Marital status: Married    Spouse name: Not on file   Number of children: Not on file   Years of education: Not on file   Highest education level: Not on file  Occupational History   Not on file  Tobacco Use   Smoking status: Never   Smokeless tobacco: Never  Vaping Use   Vaping Use: Never used  Substance and Sexual Activity   Alcohol use: Yes    Comment: rare use now   Drug use: No   Sexual activity: Not on file  Other Topics Concern   Not on file  Social History Narrative   Not on file   Social Determinants of Health   Financial Resource Strain: Not on file  Food Insecurity: Not on file  Transportation Needs: Not on file  Physical Activity: Not on  file  Stress: Not on file  Social Connections: Not on file  Intimate Partner Violence: Not on file     Family History  Problem Relation Age of Onset   CAD Neg Hx      Review of Systems: All other systems reviewed and are otherwise negative except as noted above.  Physical Exam: Vitals:   04/26/22 0800 04/26/22 0801 04/26/22 0815 04/26/22 0830  BP:   (!) 178/125   Pulse: (!) 125 (!) 124 75 (!) 124  Resp: (!) 29 (!) 32 (!) 27 (!) 26  Temp:      TempSrc:      SpO2: 97% 97% 97% 96%  Weight:      Height:        GEN- The patient is fatigued and acutely ill appearing. BiPAP in place. alert and oriented x 3 today.   HEENT: Atraumatic. JVP difficult to assess due to body habitus and BiPAP.  Lungs- Diminished throughout  Heart- Rapid and irregularly irregular and rhythm GI- Obese. Not tight, but with pitting edema in flanks.  Extremities- 3+ pitting edema that extends past thighs into flanks.  MS- no significant deformity or atrophy Skin- warm and dry, no rash or lesion Psych- euthymic mood, full affect Neuro- strength and sensation are intact  Labs:   Lab Results  Component Value Date   WBC 9.9 04/26/2022   HGB 14.5 04/26/2022   HCT 45.8 04/26/2022   MCV 92.5 04/26/2022   PLT 340 04/26/2022    Recent Labs  Lab 04/26/22 0100 04/26/22 0306  NA 138  --   K 4.1  --   CL 111  --   CO2 19*  --   BUN 42*  --   CREATININE 2.48*  --   CALCIUM 8.2*  --   PROT  --  5.7*  BILITOT  --  0.7  ALKPHOS  --  63  ALT  --  10  AST  --  16  GLUCOSE 90  --       Radiology/Studies: [place] EKG SITE RITE  Result Date: 04/26/2022 If Site Rite image not attached, placement could not be confirmed due to current cardiac rhythm.  US RENAL  Result Date: 04/26/2022 CLINICAL DATA:  [id number] with acute kidney injury. EXAM: RENAL / URINARY TRACT ULTRASOUND COMPLETE COMPARISON:  Renal ultrasound 12/02/2020 FINDINGS: Right Kidney: Renal measurements: 11.4 x 5.2 x 5.5 cm = volume: 169.6 mL,  previously 172 mL. Echogenicity within normal limits. No mass or hydronephrosis visualized. There is a 1.8 cm anechoic simple  cyst in the superior pole of this kidney, previously 1.6 cm. In the inferior pole, a second simple cyst is noted measuring 2.1 cm, and could have developed in the interval or could have been obscured by bowel gas on the prior study, but was definitely not present on CT in 2016. Left Kidney: Renal measurements: 10.5 x 5.2 x 4.3 cm = volume: 124.0 mL, previously 140 mL. Echogenicity within normal limits. No mass or hydronephrosis visualized. There previously was a nearly 6 cm simple cyst in the superior pole of this kidney, which is not seen today. No focal cortical abnormality is evident. Bladder: Appears normal for degree of bladder distention. Other: No free fluid is seen. IMPRESSION: 1. No hydronephrosis or visible stones. 2. Simple cysts in the right kidney. 3. Previously seen nearly 6 cm simple cyst in the superior pole of the left kidney is not seen today. 4. Bilaterally, no increased cortical echogenicity or significant renal thinning. Electronically Signed   By: Telford Nab M.D.   On: 04/26/2022 04:48   DG Chest Portable 1 View  Result Date: 04/26/2022 CLINICAL DATA:  Shortness of breath. EXAM: PORTABLE CHEST 1 VIEW COMPARISON:  Radiograph 12/28/2020. CT 01/24/2021 FINDINGS: Right pleural effusion appears increased from prior imaging. There is also increasing patchy opacity in the right mid lower lung zone. Possible small left pleural effusion which has also increased. Background chronic lung disease with parenchymal coarsening. Heart is upper normal in size, not well assessed on this portable exam. No pneumothorax. IMPRESSION: 1. Increasing right pleural effusion and patchy right mid and lower lung zone opacity. This may represent infection or progression of chronic lung disease. 2. Possible small left pleural effusion, also increased. Electronically Signed   By: Keith Rake M.D.   On: 04/26/2022 01:45    EKG: on arrival shows AF RVR at 149 bpm (personally reviewed)  TELEMETRY: AF RVR 130-140s on arrival, Currently appears to be in sinus tach 120-130s, with brief periods of slower sinus.   (personally reviewed)  Assessment/Plan: 1.  Persistent AF with RVR S/p Ablation 10/2020 with additional ablation of R atrial flutter  He has history of acute amiodarone lung toxicity with elevated ESR and patch bilateral infiltrates and has seen pulmonology for the same.  Update Echo pending, but suspect will be down and will preclude other AAD options.  Not currently candidate for tikosyn with AKI on CKD III Currently he is markedly volume overloaded with anasarca, with periods of sinus, suspect this is main driver. Not currently a candidate for advanced EP procedures. ? If would be candidate for high risk ablation vs AVN ablation with Left bundle/CRT pacing pending course.   2. Acute on chronic systolic CHF EF 88-41% 10/6061, pending up date Markedly volume overloaded, anasarca with edematous hands and pitting into flanks.  On BiPAP Volume management per HF team Currently, he is too acutely ill for consideration of advanced EP procedures.  Amiodarone poor option with prior acute amio toxicity, AKI and CHF preclude other AADs at this time.   3. AKI on CKD III Cr 2.48 today, baseline appears 1.4-1.6 ? If would be a candidate for tikosyn if he can settle back out, but concern with aggressive diuresis and acute HF would overcorrect and be at high risk of arrhythmias.   4. CAD LHC 2019 with PCI to prox left cx, LHC 2021 with PCI to LAD No s/s ischemia at this time. HS troponin mild up but flat.   5. Valvular heart disease Moderate AS  and mild MR on Echo 12/2020. Update pending.    For questions or updates, please contact [place] Please consult [email or web address] for contact info under Cardiology/STEMI.  Jacalyn Lefevre, PA-C  04/26/2022 9:25  AM

## 2022-04-26 NOTE — ED Notes (Signed)
Pt not tolerating venturi mask, sob; pt placed back on bipap

## 2022-04-26 NOTE — Assessment & Plan Note (Addendum)
Presented with leg swelling, dyspnea on exertion, CXR showing bilateral opacities and pleural effusions.  Weight stable, net even on I/Os.  Cr up to 3.1 mg/dL off diuretics  - Diuretics, afterload reduction on hold per Cardiology - Entresto, MRA on hold with AKI - Continue metoprolol  - D/c with [place] boots

## 2022-04-26 NOTE — Progress Notes (Signed)
Removed patient from bipap so patient could eat. Placed patient on salter cannula set at 10lpm. Will continue to monitor patients SP02 and work of breathing.

## 2022-04-26 NOTE — Progress Notes (Signed)
Pt transported on bipap from ED 15 to 3E16 without complication. RT and RN accompanied pt. Report given to floor RT. Pt remains on bipap at this time.

## 2022-04-26 NOTE — Assessment & Plan Note (Addendum)
In sinus while I am in room.  TEE canceled. Amiodarone contraindicated.   - Continue apixaban, metoprolol per Cardiology

## 2022-04-26 NOTE — Progress Notes (Signed)
Spoke with Primary RN aware PICC will be done tomorrow 12/14. Patient has working PIV.

## 2022-04-26 NOTE — Progress Notes (Signed)
Peripherally Inserted Central Catheter Placement  The IV Nurse has discussed with the patient and/or persons authorized to consent for the patient, the purpose of this procedure and the potential benefits and risks involved with this procedure.  The benefits include less needle sticks, lab draws from the catheter, and the patient may be discharged home with the catheter. Risks include, but not limited to, infection, bleeding, blood clot (thrombus formation), and puncture of an artery; nerve damage and irregular heartbeat and possibility to perform a PICC exchange if needed/ordered by physician.  Alternatives to this procedure were also discussed.  Bard Power PICC patient education guide, fact sheet on infection prevention and patient information card has been provided to patient /or left at bedside.    PICC Placement Documentation  PICC Double Lumen 04/26/22 Right Basilic 40 cm 0 cm (Active)  Indication for Insertion or Continuance of Line Vasoactive infusions 04/26/22 1910  Exposed Catheter (cm) 0 cm 04/26/22 1910  Site Assessment Clean, Dry, Intact 04/26/22 1910  Lumen #1 Status Flushed;Saline locked;Blood return noted 04/26/22 1910  Lumen #2 Status Flushed;Saline locked;Blood return noted 04/26/22 1910  Dressing Type Transparent;Securing device 04/26/22 1910  Dressing Status Antimicrobial disc in place;Clean, Dry, Intact 04/26/22 1910  Safety Lock Not Applicable 04/26/22 1910  Line Adjustment (NICU/IV Team Only) No 04/26/22 1910  Dressing Intervention New dressing;Other (Comment) 04/26/22 1910  Dressing Change Due 05/03/22 04/26/22 1910    Patient's R medial FA already swollen prior to PICC placement.    Annett Fabian 04/26/2022, 7:14 PM

## 2022-04-26 NOTE — Assessment & Plan Note (Addendum)
Glucoses controlled - Continue SS corrections

## 2022-04-26 NOTE — Progress Notes (Signed)
Pt taken off bipap at this time and placed on 4L nasal cannula. RT will continue to monitor and be available as needed.

## 2022-04-26 NOTE — ED Provider Notes (Signed)
[facility] EMERGENCY DEPARTMENT Provider Note   CSN: [id number] Arrival date & time: 04/26/22  0057     History  Chief Complaint  Patient presents with   Shortness of Breath    KALONJI ZURAWSKI is a 81 y.o. male.   Shortness of Breath  Patient is an 81 year old male with past medical history significant for obesity, HLD, HTN, CAD with 3 stents and atherosclerosis, asthma, CHF, DM 2, CKD  Patient presents emergency room today with complaints of shortness of breath seems that he has had some shortness of breath going on for approximately 1 month he states worse over the past 3 weeks and he notes he has had bilateral leg swelling.  He states he has been taking his torsemide as prescribed.  He states that his breathing got seemingly worse today and he came to the ER.  He is on Eliquis for anticoagulation because of history of A-fib.  He denies any chest pain or near syncope.  No hematemesis, no fevers, no stomach and cough at home.     Home Medications Prior to Admission medications   Medication Sig Start Date End Date Taking? Authorizing Provider  acetaminophen (TYLENOL) 500 MG tablet Take 1,000 mg by mouth every 6 (six) hours as needed for moderate pain or headache.   Yes [provider]  ADVAIR DISKUS 100-50 MCG/ACT AEPB INHALE 1 PUFF INTO THE LUNGS TWICE DAILY Patient taking differently: Inhale 1 puff into the lungs 2 (two) times daily. 02/20/22  Yes Bradd Canary, MD  albuterol (VENTOLIN HFA) 108 (90 Base) MCG/ACT inhaler Inhale 2 puffs into the lungs every 6 (six) hours as needed for wheezing or shortness of breath. 02/27/22  Yes Bradd Canary, MD  apixaban (ELIQUIS) 5 MG TABS tablet TAKE 1 TABLET(5 MG) BY MOUTH TWICE DAILY Patient taking differently: Take 5 mg by mouth 2 (two) times daily. 07/01/21  Yes Laurey Morale, MD  carvedilol (COREG) 25 MG tablet Take 12.5 mg by mouth 2 (two) times daily with a meal.   Yes [provider]   cholecalciferol (VITAMIN D) 1000 units tablet Take 1 tablet (1,000 Units total) by mouth daily. 12/09/19  Yes Amin, Ankit Chirag, MD  ezetimibe (ZETIA) 10 MG tablet TAKE 1 TABLET(10 MG) BY MOUTH DAILY Patient taking differently: Take 10 mg by mouth daily. 08/03/21  Yes Laurey Morale, MD  HUMALOG KWIKPEN 100 UNIT/ML KwikPen USE AS DIRECTED. MAX DOSE OF 15 UNITS 01/02/22  Yes Shamleffer, Konrad Dolores, MD  icosapent Ethyl (VASCEPA) 1 g capsule TAKE 2 CAPSULES(2 GRAMS) BY MOUTH TWICE DAILY Patient taking differently: Take 2 g by mouth 2 (two) times daily. 09/27/21  Yes Laurey Morale, MD  insulin glargine (LANTUS SOLOSTAR) 100 UNIT/ML Solostar Pen INJECT 12 UNITS INTO THE SKIN AT BEDTIME 03/18/22  Yes Shamleffer, Konrad Dolores, MD  metFORMIN (GLUCOPHAGE) 1000 MG tablet TAKE 1 TABLET(1000 MG) BY MOUTH DAILY WITH BREAKFAST Patient taking differently: Take 1,000 mg by mouth daily with breakfast. 04/27/21  Yes Shamleffer, Konrad Dolores, MD  nitroGLYCERIN (NITROSTAT) 0.4 MG SL tablet Place 1 tablet (0.4 mg total) under the tongue every 5 (five) minutes as needed for chest pain. 12/09/19  Yes Amin, Ankit Chirag, MD  rosuvastatin (CRESTOR) 40 MG tablet TAKE 1 TABLET(40 MG) BY MOUTH DAILY. Need lab work for future refills. Patient taking differently: Take 40 mg by mouth daily. 12/22/21  Yes Bradd Canary, MD  sacubitril-valsartan (ENTRESTO) 24-26 MG Take 1 tablet by mouth 2 (two)  times daily. NEEDS FOLLOW UP APPOINTMENT FOR ANYMORE REFILLS 01/12/22  Yes Laurey Morale, MD  spironolactone (ALDACTONE) 25 MG tablet TAKE 1 TABLET(25 MG) BY MOUTH DAILY Patient taking differently: Take 25 mg by mouth daily. 03/20/22  Yes Milford, Anderson Malta, FNP  torsemide (DEMADEX) 10 MG tablet TAKE 1 TABLET(10 MG) BY MOUTH IN THE MORNING Patient taking differently: Take 10 mg by mouth daily. 02/28/22  Yes Milford, Holcomb, FNP  glucose blood test strip Use as instructed 09/03/20   Bradd Canary, MD  Insulin Pen Needle  (PEN NEEDLES) 32G X 4 MM MISC Inject 1 Device into the skin in the morning, at noon, in the evening, and at bedtime. Use 1 at bedtime with Lantus.  Dx code: E11.9 11/30/20   Shamleffer, Konrad Dolores, MD      Allergies    Amiodarone    Review of Systems   Review of Systems  Respiratory:  Positive for shortness of breath.     Physical Exam Updated Vital Signs BP (!) 134/99   Pulse (!) 133   Temp (!) 97.5 F (36.4 C) (Axillary)   Resp 16   Ht 6' (1.829 m)   Wt 124.3 kg   SpO2 98%   BMI 37.17 kg/m  Physical Exam Vitals and nursing note reviewed.  Constitutional:      General: He is in acute distress.  HENT:     Head: Normocephalic and atraumatic.     Nose: Nose normal.     Mouth/Throat:     Mouth: Mucous membranes are moist.  Eyes:     General: No scleral icterus. Cardiovascular:     Rate and Rhythm: Normal rate and regular rhythm.     Pulses: Normal pulses.     Heart sounds: Normal heart sounds.  Pulmonary:     Effort: Respiratory distress present.     Breath sounds: Rales present. No wheezing.  Abdominal:     Palpations: Abdomen is soft.     Tenderness: There is no abdominal tenderness. There is no guarding or rebound.  Musculoskeletal:     Cervical back: Normal range of motion.     Right lower leg: Edema present.     Left lower leg: Edema present.  Skin:    General: Skin is warm and dry.     Capillary Refill: Capillary refill takes less than 2 seconds.  Neurological:     Mental Status: He is alert. Mental status is at baseline.  Psychiatric:        Mood and Affect: Mood normal.        Behavior: Behavior normal.     ED Results / Procedures / Treatments   Labs (all labs ordered are listed, but only abnormal results are displayed) Labs Reviewed  BASIC METABOLIC PANEL - Abnormal; Notable for the following components:      Result Value   CO2 19 (*)    BUN 42 (*)    Creatinine, Ser 2.48 (*)    Calcium 8.2 (*)    GFR, Estimated 26 (*)    All other  components within normal limits  CBC - Abnormal; Notable for the following components:   RDW 16.3 (*)    All other components within normal limits  BRAIN NATRIURETIC PEPTIDE - Abnormal; Notable for the following components:   B Natriuretic Peptide 1,368.4 (*)    All other components within normal limits  HEPATIC FUNCTION PANEL - Abnormal; Notable for the following components:   Total Protein 5.7 (*)  Albumin 1.8 (*)    All other components within normal limits  TROPONIN I (HIGH SENSITIVITY) - Abnormal; Notable for the following components:   Troponin I (High Sensitivity) 84 (*)    All other components within normal limits  TROPONIN I (HIGH SENSITIVITY) - Abnormal; Notable for the following components:   Troponin I (High Sensitivity) 98 (*)    All other components within normal limits  RESP PANEL BY RT-PCR (RSV, FLU A&B, COVID)  RVPGX2  HEMOGLOBIN A1C  URINALYSIS, COMPLETE (UACMP) WITH MICROSCOPIC  CBG MONITORING, ED    EKG EKG Interpretation  Date/Time:  Wednesday April 26 2022 00:58:58 EST Ventricular Rate:  149 PR Interval:  115 QRS Duration: 123 QT Interval:  292 QTC Calculation: 460 R Axis:   116 Text Interpretation: afib rvr Paired ventricular premature complexes Nonspecific intraventricular conduction delay Confirmed by Orpah Greek [phone]) on 04/26/2022 2:39:34 AM  Radiology US RENAL  Result Date: 04/26/2022 CLINICAL DATA:  [id number] with acute kidney injury. EXAM: RENAL / URINARY TRACT ULTRASOUND COMPLETE COMPARISON:  Renal ultrasound 12/02/2020 FINDINGS: Right Kidney: Renal measurements: 11.4 x 5.2 x 5.5 cm = volume: 169.6 mL, previously 172 mL. Echogenicity within normal limits. No mass or hydronephrosis visualized. There is a 1.8 cm anechoic simple cyst in the superior pole of this kidney, previously 1.6 cm. In the inferior pole, a second simple cyst is noted measuring 2.1 cm, and could have developed in the interval or could have been obscured by bowel  gas on the prior study, but was definitely not present on CT in 2016. Left Kidney: Renal measurements: 10.5 x 5.2 x 4.3 cm = volume: 124.0 mL, previously 140 mL. Echogenicity within normal limits. No mass or hydronephrosis visualized. There previously was a nearly 6 cm simple cyst in the superior pole of this kidney, which is not seen today. No focal cortical abnormality is evident. Bladder: Appears normal for degree of bladder distention. Other: No free fluid is seen. IMPRESSION: 1. No hydronephrosis or visible stones. 2. Simple cysts in the right kidney. 3. Previously seen nearly 6 cm simple cyst in the superior pole of the left kidney is not seen today. 4. Bilaterally, no increased cortical echogenicity or significant renal thinning. Electronically Signed   By: Telford Nab M.D.   On: 04/26/2022 04:48   DG Chest Portable 1 View  Result Date: 04/26/2022 CLINICAL DATA:  Shortness of breath. EXAM: PORTABLE CHEST 1 VIEW COMPARISON:  Radiograph 12/28/2020. CT 01/24/2021 FINDINGS: Right pleural effusion appears increased from prior imaging. There is also increasing patchy opacity in the right mid lower lung zone. Possible small left pleural effusion which has also increased. Background chronic lung disease with parenchymal coarsening. Heart is upper normal in size, not well assessed on this portable exam. No pneumothorax. IMPRESSION: 1. Increasing right pleural effusion and patchy right mid and lower lung zone opacity. This may represent infection or progression of chronic lung disease. 2. Possible small left pleural effusion, also increased. Electronically Signed   By: Keith Rake M.D.   On: 04/26/2022 01:45    Procedures .Critical Care  Performed by: Tedd Sias, PA Authorized by: Tedd Sias, PA   Critical care provider statement:    Critical care time (minutes):  35   Critical care time was exclusive of:  Separately billable procedures and treating other patients and teaching time    Critical care was necessary to treat or prevent imminent or life-threatening deterioration of the following conditions:  Respiratory failure   Critical  care was time spent personally by me on the following activities:  Development of treatment plan with patient or surrogate, review of old charts, re-evaluation of patient's condition, pulse oximetry, ordering and review of radiographic studies, ordering and review of laboratory studies, ordering and performing treatments and interventions, obtaining history from patient or surrogate, examination of patient and evaluation of patient's response to treatment   Care discussed with: admitting provider       Medications Ordered in ED Medications  sodium chloride flush (NS) 0.9 % injection 3 mL (has no administration in time range)  sodium chloride flush (NS) 0.9 % injection 3 mL (has no administration in time range)  0.9 %  sodium chloride infusion (has no administration in time range)  acetaminophen (TYLENOL) tablet 650 mg (has no administration in time range)  ondansetron (ZOFRAN) injection 4 mg (has no administration in time range)  insulin aspart (novoLOG) injection 0-9 Units ( Subcutaneous Not Given 04/26/22 0442)  apixaban (ELIQUIS) tablet 2.5 mg (has no administration in time range)  rosuvastatin (CRESTOR) tablet 40 mg (has no administration in time range)  furosemide (LASIX) 100 mg in dextrose 5 % 50 mL IVPB (has no administration in time range)  metoprolol tartrate (LOPRESSOR) tablet 12.5 mg (has no administration in time range)  furosemide (LASIX) injection 60 mg (60 mg Intravenous Given 04/26/22 0142)  nitroGLYCERIN (NITROGLYN) 2 % ointment 1 inch (1 inch Topical Given 04/26/22 0142)    ED Course/ Medical Decision Making/ A&P Clinical Course as of 04/26/22 0518  Wed Apr 26, 2022  0242 [place] [WF]  [id number] CAD w stents x3, CHF EF   [WF]  0319 Discussed with cardiology fellow Dr. Chancy Milroy  [WF]  [phone] Discussed with Dr. Alcario Drought who will admit  [WF]    Clinical Course User Index [WF] Tedd Sias, PA                           Medical Decision Making Amount and/or Complexity of Data Reviewed Labs: ordered. Radiology: ordered.  Risk Prescription drug management. Decision regarding hospitalization.   This patient presents to the ED for concern of SOB, this involves a number of treatment options, and is a complaint that carries with it a high risk of complications and morbidity. A differential diagnosis was considered for the patient's symptoms which is discussed below:   The causes for shortness of breath include but are not limited to Cardiac (AHF, pericardial effusion and tamponade, arrhythmias, ischemia, etc) Respiratory (COPD, asthma, pneumonia, pneumothorax, primary pulmonary hypertension, PE/VQ mismatch) Hematological (anemia) Neuromuscular (ALS, Guillain-Barr, etc)    Co morbidities: Discussed in HPI   Brief History:  Patient is an 81 year old male with past medical history significant for obesity, HLD, HTN, CAD with 3 stents and atherosclerosis, asthma, CHF, DM 2, CKD  Patient presents emergency room today with complaints of shortness of breath seems that he has had some shortness of breath going on for approximately 1 month he states worse over the past 3 weeks and he notes he has had bilateral leg swelling.  He states he has been taking his torsemide as prescribed.  He states that his breathing got seemingly worse today and he came to the ER.  He is on Eliquis for anticoagulation because of history of A-fib.  He denies any chest pain or near syncope.  No hematemesis, no fevers, no stomach and cough at home.    EMR reviewed including pt PMHx, past surgical history and past  visits to ER.   See HPI for more details   Lab Tests:   I ordered and independently interpreted labs. Labs notable for   Imaging Studies:  Abnormal findings. I personally reviewed all imaging studies. Imaging notable  for  Evidence of pulmonary edema question the beginnings of a right lower lobe infiltrate however patient is without cough or fevers no leukocytosis here low suspicion for pneumonia.  Discussed this with hospitalist.  No anemia.  BMP with AKI on CKD.  BNP is 1368 which is the highest it has been.  Troponins elevated in the setting of no chest pain I suspect that this is secondary demand.  Discussed with cardiology who is in agreement with this.  Cardiac Monitoring:  The patient was maintained on a cardiac monitor.  I personally viewed and interpreted the cardiac monitored which showed an underlying rhythm of: Rhythm is irregular and consistent with A-fib.  History of similar and on Eliquis but states that he is generally not in A-fib. EKG non-ischemic initial EKG concerning for A-fib repeat EKG appears to be sinus   Medicines ordered:  I ordered medication including Lasix 60, nitroglycerin for hypertension and fluid overload and SOB Reevaluation of the patient after these medicines showed that the patient improved I have reviewed the patients home medicines and have made adjustments as needed   Critical Interventions:   BiPAP, Lasix, nitroglycerin   Consults/Attending Physician   I requested consultation with Dr. Chancy Milroy of cardiology,  and discussed lab and imaging findings as well as pertinent plan - they recommend: admission to medicine   Reevaluation:  After the interventions noted above I re-evaluated patient and found that they have :improved   Social Determinants of Health:      Problem List / ED Course:  Respiratory failure with hypoxia and tachypnea secondary to pulmonary edema from CHF. Acute on chronic kidney injury.  Perhaps cardiorenal syndrome.  Cardiology recommends aggressive diuresis. Elevated troponins likely secondary demand from heart failure exacerbation   Dispostion:  After consideration of the diagnostic results and the patients response to  treatment, I feel that the patent would benefit from admission.    Final Clinical Impression(s) / ED Diagnoses Final diagnoses:  Acute on chronic congestive heart failure, unspecified heart failure type ([place])  Hypoxia  AKI (acute kidney injury) [facility])    Rx / DC Orders ED Discharge Orders     None         Tedd Sias, [place] 04/26/22 0518    Orpah Greek, MD 04/27/22 [phone]

## 2022-04-26 NOTE — Progress Notes (Signed)
Pt placed on documented Bipap setting due to SOB, increased WOB per MD order.  Pt tolerating well at this time

## 2022-04-26 NOTE — Progress Notes (Addendum)
Advanced Heart Failure Rounding Note  PCP-Cardiologist: Loralie Champagne, MD   Subjective:   Presented with marked volume overload and A fib RVR. Given lopressor 12.5 mg x1. Placed on Bipap now on [place].   Started on IV lasix.   SOB with conversation.   Objective:   Weight Range: 124.3 kg Body mass index is 37.17 kg/m.   Vital Signs:   Temp:  [97.5 F (36.4 C)-98 F (36.7 C)] 98 F (36.7 C) (12/13 0734) Pulse Rate:  [133-144] 133 (12/13 0200) Resp:  [16-28] 16 (12/13 0200) BP: (134-205)/(99-134) 134/99 (12/13 0200) SpO2:  [2 %-98 %] 98 % (12/13 0739) FiO2 (%):  [40 %] 40 % (12/13 0305) Weight:  [124.3 kg] 124.3 kg (12/13 0103)    Weight change: Filed Weights   04/26/22 0103  Weight: 124.3 kg    Intake/Output:   Intake/Output Summary (Last 24 hours) at 04/26/2022 0747 Last data filed at 04/26/2022 0716 Gross per 24 hour  Intake --  Output 500 ml  Net -500 ml      Physical Exam    General:  Dyspneic talking.  HEENT: Normal Neck: Supple. JVP to jaw Carotids 2+ bilat; no bruits. No lymphadenopathy or thyromegaly appreciated. Cor: PMI nondisplaced. Tachy Irregular rate & rhythm. No rubs, gallops or murmurs. Lungs: Decreased on the right on 6 liters [place]  Abdomen: Soft, nontender, nondistended. No hepatosplenomegaly. No bruits or masses. Good bowel sounds. Extremities: No cyanosis, clubbing, rash, R and LLE 3+ edema Neuro: Alert & orientedx3, cranial nerves grossly intact. moves all 4 extremities w/o difficulty. Affect pleasant   Telemetry    Afib RVR 120s  EKG    A fib RVR   Labs    CBC Recent Labs    04/26/22 0100  WBC 9.9  HGB 14.5  HCT 45.8  MCV 92.5  PLT 767   Basic Metabolic Panel Recent Labs    04/26/22 0100  NA 138  K 4.1  CL 111  CO2 19*  GLUCOSE 90  BUN 42*  CREATININE 2.48*  CALCIUM 8.2*   Liver Function Tests Recent Labs    04/26/22 0306  AST 16  ALT 10  ALKPHOS 63  BILITOT 0.7  PROT 5.7*  ALBUMIN 1.8*   No  results for input(s): "LIPASE", "AMYLASE" in the last 72 hours. Cardiac Enzymes No results for input(s): "CKTOTAL", "CKMB", "CKMBINDEX", "TROPONINI" in the last 72 hours.  BNP: BNP (last 3 results) Recent Labs    04/26/22 0100  BNP 1,368.4*    ProBNP (last 3 results) No results for input(s): "PROBNP" in the last 8760 hours.   D-Dimer No results for input(s): "DDIMER" in the last 72 hours. Hemoglobin A1C No results for input(s): "HGBA1C" in the last 72 hours. Fasting Lipid Panel No results for input(s): "CHOL", "HDL", "LDLCALC", "TRIG", "CHOLHDL", "LDLDIRECT" in the last 72 hours. Thyroid Function Tests No results for input(s): "TSH", "T4TOTAL", "T3FREE", "THYROIDAB" in the last 72 hours.  Invalid input(s): "FREET3"  Other results:   Imaging    [place] EKG SITE RITE  Result Date: 04/26/2022 If Site Rite image not attached, placement could not be confirmed due to current cardiac rhythm.  US RENAL  Result Date: 04/26/2022 CLINICAL DATA:  [id number] with acute kidney injury. EXAM: RENAL / URINARY TRACT ULTRASOUND COMPLETE COMPARISON:  Renal ultrasound 12/02/2020 FINDINGS: Right Kidney: Renal measurements: 11.4 x 5.2 x 5.5 cm = volume: 169.6 mL, previously 172 mL. Echogenicity within normal limits. No mass or hydronephrosis visualized. There is a 1.8  cm anechoic simple cyst in the superior pole of this kidney, previously 1.6 cm. In the inferior pole, a second simple cyst is noted measuring 2.1 cm, and could have developed in the interval or could have been obscured by bowel gas on the prior study, but was definitely not present on CT in 2016. Left Kidney: Renal measurements: 10.5 x 5.2 x 4.3 cm = volume: 124.0 mL, previously 140 mL. Echogenicity within normal limits. No mass or hydronephrosis visualized. There previously was a nearly 6 cm simple cyst in the superior pole of this kidney, which is not seen today. No focal cortical abnormality is evident. Bladder: Appears normal for degree of  bladder distention. Other: No free fluid is seen. IMPRESSION: 1. No hydronephrosis or visible stones. 2. Simple cysts in the right kidney. 3. Previously seen nearly 6 cm simple cyst in the superior pole of the left kidney is not seen today. 4. Bilaterally, no increased cortical echogenicity or significant renal thinning. Electronically Signed   By: Telford Nab M.D.   On: 04/26/2022 04:48   DG Chest Portable 1 View  Result Date: 04/26/2022 CLINICAL DATA:  Shortness of breath. EXAM: PORTABLE CHEST 1 VIEW COMPARISON:  Radiograph 12/28/2020. CT 01/24/2021 FINDINGS: Right pleural effusion appears increased from prior imaging. There is also increasing patchy opacity in the right mid lower lung zone. Possible small left pleural effusion which has also increased. Background chronic lung disease with parenchymal coarsening. Heart is upper normal in size, not well assessed on this portable exam. No pneumothorax. IMPRESSION: 1. Increasing right pleural effusion and patchy right mid and lower lung zone opacity. This may represent infection or progression of chronic lung disease. 2. Possible small left pleural effusion, also increased. Electronically Signed   By: Keith Rake M.D.   On: 04/26/2022 01:45     Medications:     Scheduled Medications:  apixaban  2.5 mg Oral BID   insulin aspart  0-9 Units Subcutaneous Q4H   insulin starter kit- pen needles  1 kit Other Once   metoprolol tartrate  12.5 mg Oral Q6H   rosuvastatin  40 mg Oral Daily   sodium chloride flush  3 mL Intravenous Q12H    Infusions:  sodium chloride     furosemide 100 mg (04/26/22 0716)    PRN Medications: sodium chloride, acetaminophen, ondansetron (ZOFRAN) IV, sodium chloride flush    Patient Profile    David Huynh is a 81 y.o. male with a history of CAD, chronic systolic CHF, HTN, HLD, DM, CKD 3,  asthma, and PAF.   Of note he cancelled appointments over the last year .    Assessment/Plan   1. Acute Hypoxic  Respiratory Failure -Initially on Bipap. Now [place].Sats   2.  A fib RVR Had successful DC-CV on 03/29/18 but then back in atrial fibrillation. Ranolazine added, but failed DCCV again 04/02/18. DCCV 04/19/18 was successful.  AF with RVR at 3/22 admission, amiodarone restarted and he was cardioverted to NSR, but he developed amiodarone lung toxicity.  Amiodarone was stopped. s/p atrial fibrillation ablation in 6/22. Previously on ranexa but he has not filled in 6 months.  -H/O [place] toxicity. - On Lopressor for now.  - Needs significant diuresis prior to TEE/DC-CV. He has missed eliquis  doses. He has not had eliquis filled since July.  - Consult EP. Options are limited.   3. A/C HFrEF Prior ischemic cardiomyopathy, improved after PCI in 2017.  Echo 03/2018 with EF back down to 35-40% and moderate-severe  RV dysfunction. This was in the setting of atrial fibrillation with RVR. EF 40% on TEE 04/2018.  Echo was in 3/20, showing EF up to 50%.  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  Echo in 7/21 with EF 25-30%, severe RV dysfunction.  TEE in 10/21, however, showed EF up to 50%.  He had PCI to LAD in 10/21. Echo in 3/22 showed stable EF 50-55% with normal RV.   Echo in 8/22 with EF 45-50%, moderate LVH, normal RV, moderate AS  - Repeat ECHO -NYHA IV.  -Marked volume overload. Getting 100 mg IV lasix now. Start lasix drip at 15 mg per hour.  - GDMT limited by AKI - Add hydralazine  12.5 mg tid + imdur 15 mg daily  - Add unna boots.   4. AKI  -Creatinine on 2.5 . Previous baseline ~ 1.5  -Renal US - no hydronephrosis  5. HTN  -Add hydralazine/imdur   Length of Stay: 0  Darrick Grinder, NP  04/26/2022, 7:47 AM  Advanced Heart Failure Team Pager [phone] (M-F; 7a - 5p)  Please contact [facility] for night-coverage after hours (5p -7a ) and weekends on [email or web address]  Patient seen with NP, agree with the above note.   Patient is on Bipap this morning, HR 120s in  atrial fibrillation.  Reports dyspnea for about 2 wks. Not sure how long he has been back in atrial fibrillation.  Has not had a followup in a long time in cardiology, not clear which medications he has been taking.   General: On Bipap Neck: JVP 16 cm, no thyromegaly or thyroid nodule.  Lungs: Mild crackles at bases.  CV: Nondisplaced PMI.  Heart tachy, irregular S1/S2, no S3/S4, 2/6 SEM RUSB.  2+ edema to thighs.  No carotid bruit.  Normal pedal pulses.  Abdomen: Soft, nontender, no hepatosplenomegaly, no distention.  Skin: Intact without lesions or rashes.  Neurologic: Alert and oriented x 3.  Psych: Normal affect. Extremities: No clubbing or cyanosis.  HEENT: Normal.   1. Acute on chronic CHF: Prior ischemic cardiomyopathy, improved after PCI in 2017.  Echo 03/2018 with EF back down to 35-40% and moderate-severe RV dysfunction. This was in the setting of atrial fibrillation with RVR. EF 40% on TEE 04/2018. Echo in 3/20 showed EF up to 50%.  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  Echo in 7/21 with EF 25-30%, severe RV dysfunction.  TEE in 10/21, however, showed EF up to 50%.  He had PCI to LAD in 10/21. Echo in 3/22 showed stable EF 50-55% with normal RV.   Echo in 8/22 with EF 45-50%, moderate LVH, normal RV, moderate AS.  He returns with marked volume overload in setting of recurrent AF with RVR.  No evidence for ACS, suspect exacerbation may be due to AF/RVR. He has been lost to followoup.  - Needs echo today.  - Lasix 100 mg IV x 1 then 15 mg/hr.  - Hydralazine/Imdur for afterload reduction with elevated creatinine and high BP.  - Place PICC to follow CVP and co-ox, may need milrinone.  - Unna boots.  - Ultimately needs DCCV once diuresed.  2. Atrial fibrillation: As above, concerned for possible tachy-mediated CMP.  Had successful DC-CV on 03/29/18 but then back in atrial fibrillation. Ranolazine added, but failed DCCV again 04/02/18. DCCV 04/19/18 was  successful.  AF with RVR at 3/22 admission, amiodarone restarted and he was cardioverted to NSR, but he developed  amiodarone lung toxicity.  Amiodarone was stopped. Now s/p atrial fibrillation ablation in 6/22.  He is now back in AF with RVR, not sure how long. Suspect this triggered recurrent CHF.  He does not appear to still be on ranolazine at home.  HR 120s currently.  - No amiodarone with history of lung toxicity, anti-arrhythmic options limited as he also has AKI on CKD stage 3.    - Continue Eliquis 5 mg bid (has missed doses recently).  - Toprol XL 25 mg bid for now to try to rate control (on Coreg at home).  - Will ask EP to see given difficult to manage AF.  3. AKI on CKD stage 3: Creatinine baseline in past (1 year ago) was around 1.6.   Creatinine today is 2.48.  ?Cardiorenal.  - Hold Entresto, spironolactone for now (not totally clear that he was taking).  - Diurese as above, hopefully decrease in renal venous pressure will help renal function.  - Add milrinone if low output by co-ox.  4. CAD: History of DES to RCA in 9/17.  Underwent LHC 04/03/18 with PCI to prox left circ. LHC in 10/21 with PCI to LAD.  No chest pain this admission.  Mild elevation in TnI but no trend, doubt ACS.  - He is on Eliquis, so no ASA.   - Continue statin.  - Defer cath with elevated creatinine and no ACS.  5. Aortic stenosis: Moderate on 8/22 echo.  - Need repeat echo.  6. Mitral regurgitation: Mild on 8/22 echo.  - Need repeat echo 7. 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  He improved with steroids.   Loralie Champagne 04/26/2022 8:23 AM

## 2022-04-26 NOTE — Progress Notes (Signed)
    Patient: David Huynh IHW:[id number] DOB: 1940-12-27      Brief hospital course: TIDUS UPCHURCH is a 81 y.o. male with medical history significant of combined CHF (EF 45-50%, grade 2 DD), mild AS, PAF, amiodarone pulm tox, asthma, obesity, CAD, DM2, CKD 3, HTN.   Pt in to ED with c/o SOB.  Symptoms onset and progressively worsening over past 1 month.  BLE edema that is worsning.  Positive DOE and orthopnea.  On eliquis at home for AF.  No CP, no syncope.   In to ED today due to respiratory distress.   No fevers, no cough.  This is a no charge note, for further details, please see the H&P by my partner, Dr. Julian Reil and notes from Cardiology Dr. Shirlee Latch from earlier today.    Principal Problem:   Acute on chronic combined systolic and diastolic CHF (congestive heart failure) (HCC) Active Problems:   Atrial fibrillation with RVR (HCC)   Acute-on-chronic kidney injury (HCC)   Obesity   Hyperlipidemia with target LDL less than 70   Mild persistent asthma   Essential hypertension   Cardiomyopathy, ischemic   Aortic stenosis, mild   Acute on chronic congestive heart failure (HCC)   Type 2 diabetes mellitus with stage 3a chronic kidney disease, with long-term current use of insulin (HCC)           Physical Exam: BP (!) 141/92   Pulse (!) 102   Temp (!) 94.7 F (34.8 C) (Oral)   Resp (!) 25   Ht 6' (1.829 m)   Wt 124.3 kg   SpO2 97%   BMI 37.17 kg/m              Author: Alberteen Sam, MD 04/26/2022 4:31 PM

## 2022-04-26 NOTE — Assessment & Plan Note (Addendum)
BP soft - Continue metoprolol - Hold home Coreg, Entresto, spironolactone

## 2022-04-26 NOTE — H&P (Signed)
History and Physical    Patient: David Huynh JJK:[id number] DOB: Sep 20, 1940 DOA: 04/26/2022 DOS: the patient was seen and examined on 04/26/2022 PCP: Bradd Canary, MD  Patient coming from: Home  Chief Complaint:  Chief Complaint  Patient presents with   Shortness of Breath   HPI: David Huynh is a 81 y.o. male with medical history significant of combined CHF (EF 45-50%, grade 2 DD), mild AS, PAF, amiodarone pulm tox, CAD, DM2, CKD 3, HTN.  Pt in to ED with c/o SOB.  Symptoms onset and progressively worsening over past 1 month.  BLE edema that is worsning.  Positive DOE and orthopnea.  On eliquis at home for AF.  No CP, no syncope.  In to ED today due to respiratory distress.  No fevers, no cough.    Review of Systems: As mentioned in the history of present illness. All other systems reviewed and are negative. Past Medical History:  Diagnosis Date   Allergy    seasonal   Asthma    mild, intermittent   Atrial fibrillation with rapid ventricular response (HCC) 03/25/2018   CAD, multiple vessel    Three-vessel disease involving mid RCA 85% (PCI with DES), OM 2 80% in branch and ostD1 ~90%. Only the mid RCA with PCI target.    Carotid bruit 08/05/2010   Bruit noted on left    Chronic combined systolic and diastolic CHF (congestive heart failure) (HCC)    CKD (chronic kidney disease), stage III (HCC)    Diabetes mellitus    type 2   Fracture of multiple ribs 02/12/2015   Hemothorax on right 01/23/2015   Hyperlipidemia    Hypertension    Hypocalcemia 02/12/2015   Lactic acidosis 03/25/2018   Liver function study, abnormal 04/26/2011   Multiple rib fractures    Obesity    Persistent atrial fibrillation (HCC)    Pneumonia    Past Surgical History:  Procedure Laterality Date   APPENDECTOMY  1975   ATRIAL FIBRILLATION ABLATION N/A 10/26/2020   Procedure: ATRIAL FIBRILLATION ABLATION;  Surgeon: Hillis Range, MD;  Location: [facility] INVASIVE CV LAB;  Service:  Cardiovascular;  Laterality: N/A;   CARDIAC CATHETERIZATION N/A 02/17/2016   Procedure: Left Heart Cath and Coronary Angiography;  Surgeon: Marykay Lex, MD;  Location: [facility] INVASIVE CV LAB;  Service: Cardiovascular: mRCA 85% (PCI), branch of OM2 80%,  ostD1 90%; EF 35-45%   CARDIAC CATHETERIZATION N/A 02/17/2016   Procedure: Coronary Stent Intervention;  Surgeon: Marykay Lex, MD;  Location: [facility] INVASIVE CV LAB;  Service: Cardiovascular: mRCA PCI : Andre Lefort PREM MR [id number] DES   CARDIOVERSION N/A 03/29/2018   Procedure: CARDIOVERSION;  Surgeon: Laurey Morale, MD;  Location: [facility] ENDOSCOPY;  Service: Cardiovascular;  Laterality: N/A;   CARDIOVERSION N/A 04/02/2018   Procedure: CARDIOVERSION;  Surgeon: Laurey Morale, MD;  Location: [facility] ENDOSCOPY;  Service: Cardiovascular;  Laterality: N/A;   CARDIOVERSION N/A 04/19/2018   Procedure: CARDIOVERSION;  Surgeon: Laurey Morale, MD;  Location: [facility] ENDOSCOPY;  Service: Cardiovascular;  Laterality: N/A;   CARDIOVERSION N/A 08/11/2020   Procedure: CARDIOVERSION;  Surgeon: Laurey Morale, MD;  Location: [facility] ENDOSCOPY;  Service: Cardiovascular;  Laterality: N/A;   CARDIOVERSION N/A 12/28/2020   Procedure: CARDIOVERSION;  Surgeon: Laurey Morale, MD;  Location: [facility] ENDOSCOPY;  Service: Cardiovascular;  Laterality: N/A;   CATARACT EXTRACTION  2010   b/l   CORONARY STENT INTERVENTION N/A 04/03/2018   Procedure: CORONARY STENT INTERVENTION;  Surgeon: Lennette Bihari,  MD;  Location: [facility] INVASIVE CV LAB;  Service: Cardiovascular;  Laterality: N/A;   CORONARY STENT INTERVENTION N/A 03/02/2020   Procedure: CORONARY STENT INTERVENTION;  Surgeon: Corky Crafts, MD;  Location: [facility] INVASIVE CV LAB;  Service: Cardiovascular;  Laterality: N/A;   INTRAVASCULAR ULTRASOUND/IVUS N/A 03/02/2020   Procedure: Intravascular Ultrasound/IVUS;  Surgeon: Corky Crafts, MD;  Location: [facility] INVASIVE CV LAB;  Service: Cardiovascular;  Laterality: N/A;   RETINAL LASER  PROCEDURE  1985   RIGHT/LEFT HEART CATH AND CORONARY ANGIOGRAPHY N/A 04/03/2018   Procedure: RIGHT/LEFT HEART CATH AND CORONARY ANGIOGRAPHY;  Surgeon: Laurey Morale, MD;  Location: [facility] INVASIVE CV LAB;  Service: Cardiovascular;  Laterality: N/A;   RIGHT/LEFT HEART CATH AND CORONARY ANGIOGRAPHY N/A 03/02/2020   Procedure: RIGHT/LEFT HEART CATH AND CORONARY ANGIOGRAPHY;  Surgeon: Laurey Morale, MD;  Location: [facility] INVASIVE CV LAB;  Service: Cardiovascular;  Laterality: N/A;   TEE WITHOUT CARDIOVERSION N/A 03/29/2018   Procedure: TRANSESOPHAGEAL ECHOCARDIOGRAM (TEE);  Surgeon: Laurey Morale, MD;  Location: [facility] ENDOSCOPY;  Service: Cardiovascular;  Laterality: N/A;   TEE WITHOUT CARDIOVERSION N/A 04/19/2018   Procedure: TRANSESOPHAGEAL ECHOCARDIOGRAM (TEE);  Surgeon: Laurey Morale, MD;  Location: [facility] ENDOSCOPY;  Service: Cardiovascular;  Laterality: N/A;   TEE WITHOUT CARDIOVERSION N/A 03/02/2020   Procedure: TRANSESOPHAGEAL ECHOCARDIOGRAM (TEE);  Surgeon: Laurey Morale, MD;  Location: [facility] ENDOSCOPY;  Service: Cardiovascular;  Laterality: N/A;   TEE WITHOUT CARDIOVERSION N/A 10/26/2020   Procedure: TRANSESOPHAGEAL ECHOCARDIOGRAM (TEE);  Surgeon: Elease Hashimoto Deloris Ping, MD;  Location: [facility] ENDOSCOPY;  Service: Cardiovascular;  Laterality: N/A;   TONSILLECTOMY AND ADENOIDECTOMY  1947   TRANSTHORACIC ECHOCARDIOGRAM  02/2016   Mild LVH. Moderately reduced EF of 35-40% with diffuse hypokinesis. Indeterminate diastolic function. Likely elevated LA pressures. Mild aortic stenosis (mean gradient 10 mmHg). Mild MR. Mild LA dilation. Mild to moderate PA hypertension with 39 mmHg   VIDEO ASSISTED THORACOSCOPY (VATS)/THOROCOTOMY Right 01/24/2015   Procedure: VIDEO ASSISTED THORACOSCOPY (VATS) FOR DRAINAGE OF RIGHT HEMOTHORAX;  Surgeon: Alleen Borne, MD;  Location: [facility] OR;  Service: Thoracic;  Laterality: Right;   Social History:  reports that he has never smoked. He has never used smokeless tobacco. He reports  current alcohol use. He reports that he does not use drugs.  Allergies  Allergen Reactions   Amiodarone Shortness Of Breath    Family History  Problem Relation Age of Onset   CAD Neg Hx     Prior to Admission medications   Medication Sig Start Date End Date Taking? Authorizing Provider  acetaminophen (TYLENOL) 500 MG tablet Take 1,000 mg by mouth every 6 (six) hours as needed for moderate pain or headache.    [provider]  ADVAIR DISKUS 100-50 MCG/ACT AEPB INHALE 1 PUFF INTO THE LUNGS TWICE DAILY 02/20/22   Bradd Canary, MD  albuterol (VENTOLIN HFA) 108 (90 Base) MCG/ACT inhaler Inhale 2 puffs into the lungs every 6 (six) hours as needed for wheezing or shortness of breath. 02/27/22   Bradd Canary, MD  apixaban (ELIQUIS) 5 MG TABS tablet TAKE 1 TABLET(5 MG) BY MOUTH TWICE DAILY 07/01/21   Laurey Morale, MD  carvedilol (COREG) 25 MG tablet Take 12.5 mg by mouth 2 (two) times daily with a meal.    [provider]  cholecalciferol (VITAMIN D) 1000 units tablet Take 1 tablet (1,000 Units total) by mouth daily. 12/09/19   Amin, Loura Halt, MD  ezetimibe (ZETIA) 10 MG tablet TAKE 1 TABLET(10 MG) BY MOUTH  DAILY 08/03/21   Laurey Morale, MD  glucose blood test strip Use as instructed 09/03/20   Bradd Canary, MD  HUMALOG KWIKPEN 100 UNIT/ML KwikPen USE AS DIRECTED. MAX DOSE OF 15 UNITS 01/02/22   Shamleffer, Konrad Dolores, MD  icosapent Ethyl (VASCEPA) 1 g capsule TAKE 2 CAPSULES(2 GRAMS) BY MOUTH TWICE DAILY 09/27/21   Laurey Morale, MD  insulin glargine (LANTUS SOLOSTAR) 100 UNIT/ML Solostar Pen INJECT 12 UNITS INTO THE SKIN AT BEDTIME 03/18/22   Shamleffer, Konrad Dolores, MD  Insulin Pen Needle (PEN NEEDLES) 32G X 4 MM MISC Inject 1 Device into the skin in the morning, at noon, in the evening, and at bedtime. Use 1 at bedtime with Lantus.  Dx code: E11.9 11/30/20   Shamleffer, Konrad Dolores, MD  metFORMIN (GLUCOPHAGE) 1000 MG tablet TAKE 1 TABLET(1000 MG) BY  MOUTH DAILY WITH BREAKFAST 04/27/21   Shamleffer, Konrad Dolores, MD  nitroGLYCERIN (NITROSTAT) 0.4 MG SL tablet Place 1 tablet (0.4 mg total) under the tongue every 5 (five) minutes as needed for chest pain. 12/09/19   Amin, Loura Halt, MD  polyvinyl alcohol (LIQUIFILM TEARS) 1.4 % ophthalmic solution Place 1 drop into both eyes daily as needed for dry eyes (irritation).    [provider]  rosuvastatin (CRESTOR) 40 MG tablet TAKE 1 TABLET(40 MG) BY MOUTH DAILY. Need lab work for future refills. 12/22/21   Bradd Canary, MD  sacubitril-valsartan (ENTRESTO) 24-26 MG Take 1 tablet by mouth 2 (two) times daily. NEEDS FOLLOW UP APPOINTMENT FOR ANYMORE REFILLS 01/12/22   Laurey Morale, MD  sodium polystyrene (KAYEXALATE) 15 GM/60ML suspension Take 60 mLs (15 g total) by mouth daily as needed. 09/08/20   Bradd Canary, MD  spironolactone (ALDACTONE) 25 MG tablet TAKE 1 TABLET(25 MG) BY MOUTH DAILY 03/20/22   Jacklynn Ganong, FNP  torsemide (DEMADEX) 10 MG tablet TAKE 1 TABLET(10 MG) BY MOUTH IN THE MORNING 02/28/22   Jacklynn Ganong, [place]    Physical Exam: Vitals:   04/26/22 0118 04/26/22 0145 04/26/22 0200 04/26/22 0305  BP:  (!) 146/106 (!) 134/99   Pulse: (!) 144 (!) 135 (!) 133   Resp: (!) 24 (!) 28 16   Temp:      TempSrc:      SpO2: 98% 98% 97% 98%  Weight:      Height:       Constitutional: NAD, calm, comfortable Eyes: PERRL, lids and conjunctivae normal ENMT: Mucous membranes are moist. Posterior pharynx clear of any exudate or lesions.Normal dentition.  Neck: normal, supple, no masses, no thyromegaly Respiratory: Rales, but no respiratory distress by the time of my evaluation, talking in complete sentences though on a BIPAP. Cardiovascular: Mild tachycardia with regular rhythm at time of my exam, 3+ BLE edema Abdomen: no tenderness, no masses palpated. No hepatosplenomegaly. Bowel sounds positive.  Musculoskeletal: no clubbing / cyanosis. No joint deformity upper  and lower extremities. Good ROM, no contractures. Normal muscle tone.  Skin: no rashes, lesions, ulcers. No induration Neurologic: CN 2-12 grossly intact. Sensation intact, DTR normal. Strength 5/5 in all 4.  Psychiatric: Normal judgment and insight. Alert and oriented x 3. Normal mood.   Data Reviewed:       Latest Ref Rng & Units 04/26/2022    1:00 AM 01/28/2021    2:55 PM 12/31/2020    4:06 AM  CBC  WBC 4.0 - 10.5 K/uL 9.9  9.0  12.4   Hemoglobin 13.0 - 17.0 g/dL 14.5  13.7  12.2   Hematocrit 39.0 - 52.0 % 45.8  43.1  38.0   Platelets 150 - 400 K/uL 340  394  528       Latest Ref Rng & Units 04/26/2022    3:06 AM 04/26/2022    1:00 AM 03/24/2021    2:34 PM  CMP  Glucose 70 - 99 mg/dL  90  161   BUN 8 - 23 mg/dL  42  29   Creatinine 0.96 - 1.24 mg/dL  0.45  4.09   Sodium 811 - 145 mmol/L  138  139   Potassium 3.5 - 5.1 mmol/L  4.1  4.7   Chloride 98 - 111 mmol/L  111  103   CO2 22 - 32 mmol/L  19  27   Calcium 8.9 - 10.3 mg/dL  8.2  9.4   Total Protein 6.5 - 8.1 g/dL 5.7     Total Bilirubin 0.3 - 1.2 mg/dL 0.7     Alkaline Phos 38 - 126 U/L 63     AST 15 - 41 U/L 16     ALT 0 - 44 U/L 10      Trop 84 -> 98  BNP 1368  Albumin 1.8.  Assessment and Plan: * Acute on chronic combined systolic and diastolic CHF (congestive heart failure) (HCC) Pt clearly very fluid overloaded on exam.  BNP is highest it has ever been, no SIRS, I think clinically the CXR findings today likely also represent CHF. Sounds like he might not be completely compliant with home meds from what wife was telling ED RN earlier. CHF pathway Mediocre UOP after  IV lasix Cards coming to consult and put recs in, presumably will need higher doses of diuretics Tele monitor 2d echo Strict intake and output Management of a.flutter RVR as below Rescue BIPAP for the moment, try to wean as able.  Atrial fibrillation with RVR (HCC) Pt in and out of A.Fib and what seems to be a 2:1 flutter with rate of  133.  When in sinus rhythm is s.tach at rate of ~100. Med rec pending Presumably will need BB vs CCB, cards coming to consult on patient, plan to start what ever they recommend. No amiodarone due to h/o amiodarone pulm tox. Will reduce eliquis dose to 2.5mg  BID for the moment given renal fxn + age  Acute-on-chronic kidney injury (HCC) Pt with AKI on CKD 3a. Question cardiorenal syndrome?  Pt very volume overloaded. Hold any nephrotoxic meds Trying to diurese given volume overloaded status Strict intake and output BMP daily Getting renal US to look for any evidence of obstruction May need nephro involvement depending on response to further attempts to diurese / trend of kidney function. Albumin 1.8 but no other liver abnormalities.  ? Nephrotic syndrome.  Will see what UA looks like when it comes back.  Aortic stenosis, mild Getting 2d echo for CHF and AF.  Type 2 diabetes mellitus with stage 3a chronic kidney disease, with long-term current use of insulin (HCC) Home med rec pending While NPO on BIPAP: will put pt on sensitive SSI Q4H for the moment.  Essential hypertension Med rec pending BP improved after NTG paste Presumably going to need BB or CCB anyhow for a.fib rate control. Doesn't sound like he's been entirely adherent to recommended home meds from what wife was telling ED staff earlier.      Advance Care Planning:   Code Status: Full Code  Consults: Cardiology coming to consult on patient  Family Communication: No family in room  Severity of Illness: The appropriate patient status for this patient is OBSERVATION. Observation status is judged to be reasonable and necessary in order to provide the required intensity of service to ensure the patient's safety. The patient's presenting symptoms, physical exam findings, and initial radiographic and laboratory data in the context of their medical condition is felt to place them at decreased risk for further clinical  deterioration. Furthermore, it is anticipated that the patient will be medically stable for discharge from the hospital within 2 midnights of admission.   Author: Hillary Bow., DO 04/26/2022 4:16 AM  For on call review [email or web address].

## 2022-04-26 NOTE — Progress Notes (Signed)
Heart Failure Navigator Progress Note  Assessed for Heart & Vascular TOC clinic readiness.  Patient does not meet criteria due to prior to hospitalization care established with AHF clinic and Dr. Shirlee Latch.   HF Navigation team will sign-off.   Ozella Rocks, MSN, RN Heart Failure Nurse Navigator

## 2022-04-26 NOTE — Hospital Course (Addendum)
David Huynh is an 81 y.o. M with sdCHF EF 45-50%, pAF on Eliquis, obesity, DM, CKD IIIb, HTN and hx amio lung fibrosis who presented with progressive SOB and edema. Wife reported poor compliance with meds. 12/13: Admitted in respiratory distress on BiPAP, Lasix started; Cardiology consulted 12/14: Still on BiPAP; Pulmonology consulted; underwent thoracentesis 12/15: Breathing improved 12/16: Repeat thoracentesis 12/17: Nephrology consulted 12/18: Transitioned to oral diuretics 12/19: Urinary retention, antibiotics started 12/20: Cr up again, given albumin 12/23 renal function was improving initially after albumin worsened again.  Currently 3.37, highest reported creatinine for the patient in our system.  Discharge is currently canceled.  Patient received albumin again.

## 2022-04-26 NOTE — Progress Notes (Signed)
Pt continues to be on and off bipap throughout the day. RT will continue to be available as needed.

## 2022-04-26 NOTE — ED Triage Notes (Signed)
Pt was at home and has had increasing SOB x 1 month. Pt also has BLLE pitting edema. Compliant with lasix. Tonight his wife fell and he helped her up and became SOB and it did not resolve. When EMS arrived he was 85% on room air.  Pt has history of CHF. Denies CP

## 2022-04-26 NOTE — Care Plan (Signed)
Called to wife, no answer, left VM

## 2022-04-26 NOTE — ED Notes (Signed)
Pt placed back on bipap; Dr. Maryfrances Bunnell notified

## 2022-04-26 NOTE — Consult Note (Signed)
Cardiology Consultation   Patient ID: David Huynh MRN: [id number]; DOB: 06/15/1940  Admit date: 04/26/2022 Date of Consult: 04/26/2022  PCP:  [name], MD   [place] Providers Cardiologist:  Loralie Champagne, MD        Patient Profile:   David Huynh is a 81 y.o. male with a hx of HFrEF (EF 40%), pAF, Amio lung toxicity, CAD, CKD 3, HTN, DM2  who is being seen 04/26/2022 for the evaluation of ADHF at the request of [place]  History of Present Illness:   David Huynh is a 81 y.o. male with a hx of HFrEF (EF 40%), Moderate AS, pAF, Amio lung toxicity, Severe CAD s/p stents in RCA, LCX and LAD, CKD 3, HTN, DM2  who is being seen 04/26/2022 for the evaluation of ADHF at the request of [place]. Patient had EF 35-40% in 9/17, had cath with DES to RCA. In the prior has had multiple episodes for ADHF and Afib. Had [facility] 04/03/18 with PCI to proximal LCx. He then had TEE-DCCV on 04/19/18 back to NSR. EF on TEE was 40%. He saw Dr. Rayann Heman to discuss ablation and opted to continue amiodarone but then developed lung toxicity. LHC/RHC was done, showing normal filling pressures and significant LAD disease. The patient is now s/p DES to LAD as well in 2021.   Now coming to the ED today with complains of SOB and edema which has been worsening since 3 weeks. Has extensive edema in legs. Denies CP. Has DOE and orthopnea. No infectious signs including fever or cough. Work up in ED shows BNP >1K, troponin mildly elevated; Cr 2.5. Albumin 1.8.    Past Medical History:  Diagnosis Date   Allergy    seasonal   Asthma    mild, intermittent   Atrial fibrillation with rapid ventricular response ([place]) 03/25/2018   CAD, multiple vessel    Three-vessel disease involving mid RCA 85% (PCI with DES), OM 2 80% in branch and ostD1 ~90%. Only the mid RCA with PCI target.    Carotid bruit 08/05/2010   Bruit noted on left    Chronic combined systolic and diastolic CHF (congestive heart  failure) (HCC)    CKD (chronic kidney disease), stage III ([place])    Diabetes mellitus    type 2   Fracture of multiple ribs 02/12/2015   Hemothorax on right 01/23/2015   Hyperlipidemia    Hypertension    Hypocalcemia 02/12/2015   Lactic acidosis 03/25/2018   Liver function study, abnormal 04/26/2011   Multiple rib fractures    Obesity    Persistent atrial fibrillation ([place])    Pneumonia     Past Surgical History:  Procedure Laterality Date   APPENDECTOMY  1975   ATRIAL FIBRILLATION ABLATION N/A 10/26/2020   Procedure: ATRIAL FIBRILLATION ABLATION;  Surgeon: Thompson Grayer, MD;  Location: [place] [facility];  Service: Cardiovascular;  Laterality: N/A;   CARDIAC CATHETERIZATION N/A 02/17/2016   Procedure: Left Heart Cath and Coronary Angiography;  Surgeon: Leonie Man, MD;  Location: [place] [facility];  Service: Cardiovascular: mRCA 85% (PCI), branch of OM2 80%,  ostD1 90%; EF 35-45%   CARDIAC CATHETERIZATION N/A 02/17/2016   Procedure: Coronary Stent Intervention;  Surgeon: Leonie Man, MD;  Location: [place] [facility];  Service: Cardiovascular: mRCA PCI : Ronna Polio PREM MR 0.0F74 DES   CARDIOVERSION N/A 03/29/2018   Procedure: CARDIOVERSION;  Surgeon: Larey Dresser, MD;  Location: [place];  Service: Cardiovascular;  Laterality: N/A;   CARDIOVERSION N/A 04/02/2018   Procedure: CARDIOVERSION;  Surgeon: Larey Dresser, MD;  Location: [facility] ENDOSCOPY;  Service: Cardiovascular;  Laterality: N/A;   CARDIOVERSION N/A 04/19/2018   Procedure: CARDIOVERSION;  Surgeon: Larey Dresser, MD;  Location: [facility] ENDOSCOPY;  Service: Cardiovascular;  Laterality: N/A;   CARDIOVERSION N/A 08/11/2020   Procedure: CARDIOVERSION;  Surgeon: Larey Dresser, MD;  Location: [facility] ENDOSCOPY;  Service: Cardiovascular;  Laterality: N/A;   CARDIOVERSION N/A 12/28/2020   Procedure: CARDIOVERSION;  Surgeon: Larey Dresser, MD;  Location: [facility] ENDOSCOPY;  Service: Cardiovascular;  Laterality: N/A;   CATARACT  EXTRACTION  2010   b/l   CORONARY STENT INTERVENTION N/A 04/03/2018   Procedure: CORONARY STENT INTERVENTION;  Surgeon: Troy Sine, MD;  Location: [place] [facility];  Service: Cardiovascular;  Laterality: N/A;   CORONARY STENT INTERVENTION N/A 03/02/2020   Procedure: CORONARY STENT INTERVENTION;  Surgeon: Jettie Booze, MD;  Location: [place] [facility];  Service: Cardiovascular;  Laterality: N/A;   INTRAVASCULAR ULTRASOUND/IVUS N/A 03/02/2020   Procedure: Intravascular Ultrasound/IVUS;  Surgeon: Jettie Booze, MD;  Location: [place] [facility];  Service: Cardiovascular;  Laterality: N/A;   RETINAL LASER PROCEDURE  1985   RIGHT/LEFT HEART CATH AND CORONARY ANGIOGRAPHY N/A 04/03/2018   Procedure: RIGHT/LEFT HEART CATH AND CORONARY ANGIOGRAPHY;  Surgeon: Larey Dresser, MD;  Location: [place] [facility];  Service: Cardiovascular;  Laterality: N/A;   RIGHT/LEFT HEART CATH AND CORONARY ANGIOGRAPHY N/A 03/02/2020   Procedure: RIGHT/LEFT HEART CATH AND CORONARY ANGIOGRAPHY;  Surgeon: Larey Dresser, MD;  Location: [place] [facility];  Service: Cardiovascular;  Laterality: N/A;   TEE WITHOUT CARDIOVERSION N/A 03/29/2018   Procedure: TRANSESOPHAGEAL ECHOCARDIOGRAM (TEE);  Surgeon: Larey Dresser, MD;  Location: [facility] ENDOSCOPY;  Service: Cardiovascular;  Laterality: N/A;   TEE WITHOUT CARDIOVERSION N/A 04/19/2018   Procedure: TRANSESOPHAGEAL ECHOCARDIOGRAM (TEE);  Surgeon: Larey Dresser, MD;  Location: [facility] ENDOSCOPY;  Service: Cardiovascular;  Laterality: N/A;   TEE WITHOUT CARDIOVERSION N/A 03/02/2020   Procedure: TRANSESOPHAGEAL ECHOCARDIOGRAM (TEE);  Surgeon: Larey Dresser, MD;  Location: [facility] ENDOSCOPY;  Service: Cardiovascular;  Laterality: N/A;   TEE WITHOUT CARDIOVERSION N/A 10/26/2020   Procedure: TRANSESOPHAGEAL ECHOCARDIOGRAM (TEE);  Surgeon: Acie Fredrickson Wonda Cheng, MD;  Location: [place];  Service: Cardiovascular;  Laterality: N/A;   [place] ECHOCARDIOGRAM  02/2016   Mild LVH. Moderately reduced EF of 35-40% with diffuse hypokinesis. Indeterminate diastolic function. Likely elevated LA pressures. Mild aortic stenosis (mean gradient 10 mmHg). Mild MR. Mild LA dilation. Mild to moderate PA hypertension with 39 mmHg   VIDEO ASSISTED THORACOSCOPY (VATS)/THOROCOTOMY Right 01/24/2015   Procedure: VIDEO ASSISTED THORACOSCOPY (VATS) FOR DRAINAGE OF RIGHT HEMOTHORAX;  Surgeon: Gaye Pollack, MD;  Location: [facility] OR;  Service: Thoracic;  Laterality: Right;       Inpatient Medications: Scheduled Meds:  apixaban  2.5 mg Oral BID   insulin aspart  0-9 Units Subcutaneous Q4H   insulin starter kit- pen needles  1 kit Other Once   sodium chloride flush  3 mL Intravenous Q12H   Continuous Infusions:  sodium chloride     PRN Meds: sodium chloride, acetaminophen, ondansetron (ZOFRAN) IV, sodium chloride flush  Allergies:    Allergies  Allergen Reactions   Amiodarone Shortness Of Breath    Pulmonary toxicity    Social History:   Social History   Socioeconomic History   Marital status: Married  Spouse name: Not on file   Number of children: Not on file   Years of education: Not on file   Highest education level: Not on file  Occupational History   Not on file  Tobacco Use   Smoking status: Never   Smokeless tobacco: Never  Vaping Use   Vaping Use: Never used  Substance and Sexual Activity   Alcohol use: Yes    Comment: rare use now   Drug use: No   Sexual activity: Not on file  Other Topics Concern   Not on file  Social History Narrative   Not on file   Social Determinants of Health   Financial Resource Strain: Not on file  Food Insecurity: Not on file  Transportation Needs: Not on file  Physical Activity: Not on file  Stress: Not on file  Social Connections: Not on file  Intimate Partner Violence: Not on file    Family History:   Family History  Problem Relation Age of Onset   CAD Neg  Hx      ROS:  Please see the history of present illness.  All other ROS reviewed and negative.     Physical Exam/Data:   Vitals:   04/26/22 0118 04/26/22 0145 04/26/22 0200 04/26/22 0305  BP:  (!) 146/106 (!) 134/99   Pulse: (!) 144 (!) 135 (!) 133   Resp: (!) 24 (!) 28 16   Temp:      TempSrc:      SpO2: 98% 98% 97% 98%  Weight:      Height:       No intake or output data in the 24 hours ending 04/26/22 0431    04/26/2022    1:03 AM 09/22/2021    1:23 PM 05/24/2021    4:16 PM  Last 3 Weights  Weight (lbs) 274 lb 0.5 oz 274 lb 273 lb 3.2 oz  Weight (kg) 124.3 kg 124.286 kg 123.923 kg     Body mass index is 37.17 kg/m.  General:  Well nourished, well developed, in mild distress HEENT: normal Neck: Elevated JVD Vascular: No carotid bruits; Distal pulses 2+ bilaterally Cardiac:  normal S1, S2; RRR; mild systolic murmur Lungs:  Bilateral crackles Abd: soft, nontender, no hepatomegaly  Ext: +3 pitting edema Musculoskeletal:  No deformities, BUE and BLE strength normal and equal Skin: warm and dry  Neuro:  CNs 2-12 intact, no focal abnormalities noted Psych:  Normal affect   EKG:  The EKG was personally reviewed and demonstrates:  Aflutter  Relevant CV Studies:   Echo (11/19): EF 35-40%, moderate to severely decreased RV systolic function.  - TEE (12/19): EF 40%, diffuse hypokinesis, normal RV size and systolic function.  - Echo (3/20): EF 50%, normal RV, mild MR, mild AS with mean gradient 14 mmHg. - Echo (7/21): EF 25-30%, global hypokinesis, severely decreased RV systolic function with severe RV enlargement, at least moderate aortic stenosis.   - TEE (10/21): EF 50%, anterior hypokinesis, basal inferior hypokinesis, normal RV, moderate AS with mean gradient 22 mmHg and AVA 1.18 cm^2, moderate MR with ERO 0.32 cm^2.  - Echo (3/22): EF 50-55% with normal RV and mild AS, mild MR - RHC (10/21): mean RA 2, PA 29/4, mean PCWP 6, CI 2.66, easily crossed AoV with mean  gradient 15 mmHg.  - Echo (8/22): EF 45-50%, moderate LVH, RV normal, mild MR, moderate AS.   Laboratory Data:  High Sensitivity Troponin:   Recent Labs  Lab 04/26/22 0100 04/26/22 0306  TROPONINIHS 84* 98*     Chemistry Recent Labs  Lab 04/26/22 0100  NA 138  K 4.1  CL 111  CO2 19*  GLUCOSE 90  BUN 42*  CREATININE 2.48*  CALCIUM 8.2*  GFRNONAA 26*  ANIONGAP 8    Recent Labs  Lab 04/26/22 0306  PROT 5.7*  ALBUMIN 1.8*  AST 16  ALT 10  ALKPHOS 63  BILITOT 0.7   Lipids No results for input(s): "CHOL", "TRIG", "HDL", "LABVLDL", "LDLCALC", "CHOLHDL" in the last 168 hours.  Hematology Recent Labs  Lab 04/26/22 0100  WBC 9.9  RBC 4.95  HGB 14.5  HCT 45.8  MCV 92.5  MCH 29.3  MCHC 31.7  RDW 16.3*  PLT 340   Thyroid No results for input(s): "TSH", "FREET4" in the last 168 hours.  BNP Recent Labs  Lab 04/26/22 0100  BNP 1,368.4*    DDimer No results for input(s): "DDIMER" in the last 168 hours.   Radiology/Studies:  DG Chest Portable 1 View  Result Date: 04/26/2022 CLINICAL DATA:  Shortness of breath. EXAM: PORTABLE CHEST 1 VIEW COMPARISON:  Radiograph 12/28/2020. CT 01/24/2021 FINDINGS: Right pleural effusion appears increased from prior imaging. There is also increasing patchy opacity in the right mid lower lung zone. Possible small left pleural effusion which has also increased. Background chronic lung disease with parenchymal coarsening. Heart is upper normal in size, not well assessed on this portable exam. No pneumothorax. IMPRESSION: 1. Increasing right pleural effusion and patchy right mid and lower lung zone opacity. This may represent infection or progression of chronic lung disease. 2. Possible small left pleural effusion, also increased. Electronically Signed   By: Keith Rake M.D.   On: 04/26/2022 01:45     Assessment and Plan:   # Acute on Chronic Systolic and Diastolic HF # Afib RVR s/p ablation in 6/22 # Severe CAD s/p stents in  RCA, Lcx and LAD # Moderate AS # Amiodarone Lung toxicity # AKI on CKD  -Patient with extensive fluid overload. Cr 2.6 up from baseline 1.3 -BNP elevated; troponin most likely due to demand. No CP -Albumin 1.8- will get UACR -IV lasix 100 BID -Strict I/O and serial weights -Echo in AM -For Afib RVR-limited options as hx of amio toxicity, diltiazem cannot be given due to low EF, and digoxin will avoid due to renal issues. Will do low dose metoprolol 12.5 Q6; and ultimately will need DCCV once optimally diuresed. Ablation done in 6/22 -History of DES to RCA in 9/17.  Underwent LHC 04/03/18 with PCI to prox left circ. LHC in 10/21 with PCI to LAD.   - He is on Eliquis, so no ASA.  Continue statin -Hold spironolactone, Entresto and Jardiance in the setting of severe AKI   For questions or updates, please contact [place] Please consult [email or web address] for contact info under    Signed, Jaci Lazier, MD  04/26/2022 4:31 AM

## 2022-04-27 ENCOUNTER — Encounter (HOSPITAL_COMMUNITY)
Admission: EM | Disposition: A | Discharge status: Skilled Nursing Facility | Payer: Self-pay | Source: Home / Self Care | Attending: Family Medicine

## 2022-04-27 ENCOUNTER — Inpatient Hospital Stay (HOSPITAL_COMMUNITY): Payer: Medicare Other

## 2022-04-27 DIAGNOSIS — I129 Hypertensive chronic kidney disease with stage 1 through stage 4 chronic kidney disease, or unspecified chronic kidney disease: Secondary | ICD-10-CM | POA: Diagnosis not present

## 2022-04-27 DIAGNOSIS — J90 Pleural effusion, not elsewhere classified: Secondary | ICD-10-CM | POA: Diagnosis not present

## 2022-04-27 DIAGNOSIS — E8809 Other disorders of plasma-protein metabolism, not elsewhere classified: Secondary | ICD-10-CM | POA: Diagnosis present

## 2022-04-27 DIAGNOSIS — I4892 Unspecified atrial flutter: Secondary | ICD-10-CM | POA: Diagnosis not present

## 2022-04-27 DIAGNOSIS — I4891 Unspecified atrial fibrillation: Secondary | ICD-10-CM | POA: Diagnosis not present

## 2022-04-27 DIAGNOSIS — J4530 Mild persistent asthma, uncomplicated: Secondary | ICD-10-CM | POA: Diagnosis present

## 2022-04-27 DIAGNOSIS — Z515 Encounter for palliative care: Secondary | ICD-10-CM | POA: Diagnosis not present

## 2022-04-27 DIAGNOSIS — I509 Heart failure, unspecified: Secondary | ICD-10-CM

## 2022-04-27 DIAGNOSIS — N3000 Acute cystitis without hematuria: Secondary | ICD-10-CM | POA: Diagnosis present

## 2022-04-27 DIAGNOSIS — J810 Acute pulmonary edema: Secondary | ICD-10-CM

## 2022-04-27 DIAGNOSIS — Z794 Long term (current) use of insulin: Secondary | ICD-10-CM | POA: Diagnosis not present

## 2022-04-27 DIAGNOSIS — J811 Chronic pulmonary edema: Secondary | ICD-10-CM | POA: Diagnosis not present

## 2022-04-27 DIAGNOSIS — D6869 Other thrombophilia: Secondary | ICD-10-CM | POA: Diagnosis not present

## 2022-04-27 DIAGNOSIS — M6281 Muscle weakness (generalized): Secondary | ICD-10-CM | POA: Diagnosis not present

## 2022-04-27 DIAGNOSIS — R55 Syncope and collapse: Secondary | ICD-10-CM | POA: Diagnosis not present

## 2022-04-27 DIAGNOSIS — N049 Nephrotic syndrome with unspecified morphologic changes: Secondary | ICD-10-CM | POA: Diagnosis not present

## 2022-04-27 DIAGNOSIS — D649 Anemia, unspecified: Secondary | ICD-10-CM | POA: Diagnosis not present

## 2022-04-27 DIAGNOSIS — B9620 Unspecified Escherichia coli [E. coli] as the cause of diseases classified elsewhere: Secondary | ICD-10-CM | POA: Diagnosis present

## 2022-04-27 DIAGNOSIS — N139 Obstructive and reflux uropathy, unspecified: Secondary | ICD-10-CM | POA: Diagnosis present

## 2022-04-27 DIAGNOSIS — I5030 Unspecified diastolic (congestive) heart failure: Secondary | ICD-10-CM | POA: Diagnosis not present

## 2022-04-27 DIAGNOSIS — N1832 Chronic kidney disease, stage 3b: Secondary | ICD-10-CM | POA: Diagnosis present

## 2022-04-27 DIAGNOSIS — N189 Chronic kidney disease, unspecified: Secondary | ICD-10-CM | POA: Diagnosis not present

## 2022-04-27 DIAGNOSIS — I10 Essential (primary) hypertension: Secondary | ICD-10-CM | POA: Diagnosis not present

## 2022-04-27 DIAGNOSIS — J8410 Pulmonary fibrosis, unspecified: Secondary | ICD-10-CM

## 2022-04-27 DIAGNOSIS — Z7401 Bed confinement status: Secondary | ICD-10-CM | POA: Diagnosis not present

## 2022-04-27 DIAGNOSIS — E1142 Type 2 diabetes mellitus with diabetic polyneuropathy: Secondary | ICD-10-CM | POA: Diagnosis not present

## 2022-04-27 DIAGNOSIS — I350 Nonrheumatic aortic (valve) stenosis: Secondary | ICD-10-CM | POA: Diagnosis not present

## 2022-04-27 DIAGNOSIS — Z7189 Other specified counseling: Secondary | ICD-10-CM | POA: Diagnosis not present

## 2022-04-27 DIAGNOSIS — J9601 Acute respiratory failure with hypoxia: Secondary | ICD-10-CM

## 2022-04-27 DIAGNOSIS — E1165 Type 2 diabetes mellitus with hyperglycemia: Secondary | ICD-10-CM | POA: Diagnosis present

## 2022-04-27 DIAGNOSIS — Z66 Do not resuscitate: Secondary | ICD-10-CM | POA: Diagnosis present

## 2022-04-27 DIAGNOSIS — R000 Tachycardia, unspecified: Secondary | ICD-10-CM | POA: Diagnosis not present

## 2022-04-27 DIAGNOSIS — E1122 Type 2 diabetes mellitus with diabetic chronic kidney disease: Secondary | ICD-10-CM | POA: Diagnosis present

## 2022-04-27 DIAGNOSIS — R918 Other nonspecific abnormal finding of lung field: Secondary | ICD-10-CM | POA: Diagnosis not present

## 2022-04-27 DIAGNOSIS — J9811 Atelectasis: Secondary | ICD-10-CM | POA: Diagnosis present

## 2022-04-27 DIAGNOSIS — I471 Supraventricular tachycardia, unspecified: Secondary | ICD-10-CM | POA: Diagnosis present

## 2022-04-27 DIAGNOSIS — R339 Retention of urine, unspecified: Secondary | ICD-10-CM | POA: Diagnosis not present

## 2022-04-27 DIAGNOSIS — N1831 Chronic kidney disease, stage 3a: Secondary | ICD-10-CM | POA: Diagnosis not present

## 2022-04-27 DIAGNOSIS — I4819 Other persistent atrial fibrillation: Secondary | ICD-10-CM | POA: Diagnosis present

## 2022-04-27 DIAGNOSIS — R531 Weakness: Secondary | ICD-10-CM | POA: Diagnosis not present

## 2022-04-27 DIAGNOSIS — N179 Acute kidney failure, unspecified: Secondary | ICD-10-CM | POA: Diagnosis present

## 2022-04-27 DIAGNOSIS — I5043 Acute on chronic combined systolic (congestive) and diastolic (congestive) heart failure: Secondary | ICD-10-CM | POA: Diagnosis present

## 2022-04-27 DIAGNOSIS — R0602 Shortness of breath: Secondary | ICD-10-CM | POA: Diagnosis not present

## 2022-04-27 DIAGNOSIS — I2510 Atherosclerotic heart disease of native coronary artery without angina pectoris: Secondary | ICD-10-CM | POA: Diagnosis not present

## 2022-04-27 DIAGNOSIS — I080 Rheumatic disorders of both mitral and aortic valves: Secondary | ICD-10-CM | POA: Diagnosis present

## 2022-04-27 DIAGNOSIS — R2681 Unsteadiness on feet: Secondary | ICD-10-CM | POA: Diagnosis not present

## 2022-04-27 DIAGNOSIS — E8779 Other fluid overload: Secondary | ICD-10-CM | POA: Diagnosis not present

## 2022-04-27 DIAGNOSIS — R0600 Dyspnea, unspecified: Secondary | ICD-10-CM | POA: Diagnosis not present

## 2022-04-27 DIAGNOSIS — I4719 Other supraventricular tachycardia: Secondary | ICD-10-CM | POA: Diagnosis not present

## 2022-04-27 DIAGNOSIS — R0902 Hypoxemia: Secondary | ICD-10-CM | POA: Diagnosis present

## 2022-04-27 DIAGNOSIS — R2689 Other abnormalities of gait and mobility: Secondary | ICD-10-CM | POA: Diagnosis not present

## 2022-04-27 DIAGNOSIS — E669 Obesity, unspecified: Secondary | ICD-10-CM | POA: Diagnosis present

## 2022-04-27 DIAGNOSIS — J309 Allergic rhinitis, unspecified: Secondary | ICD-10-CM | POA: Diagnosis not present

## 2022-04-27 DIAGNOSIS — I484 Atypical atrial flutter: Secondary | ICD-10-CM | POA: Diagnosis present

## 2022-04-27 DIAGNOSIS — I130 Hypertensive heart and chronic kidney disease with heart failure and stage 1 through stage 4 chronic kidney disease, or unspecified chronic kidney disease: Secondary | ICD-10-CM | POA: Diagnosis present

## 2022-04-27 DIAGNOSIS — E785 Hyperlipidemia, unspecified: Secondary | ICD-10-CM | POA: Diagnosis not present

## 2022-04-27 DIAGNOSIS — I255 Ischemic cardiomyopathy: Secondary | ICD-10-CM | POA: Diagnosis not present

## 2022-04-27 DIAGNOSIS — Z1152 Encounter for screening for COVID-19: Secondary | ICD-10-CM | POA: Diagnosis not present

## 2022-04-27 DIAGNOSIS — E569 Vitamin deficiency, unspecified: Secondary | ICD-10-CM | POA: Diagnosis not present

## 2022-04-27 LAB — BASIC METABOLIC PANEL
Anion gap: 7 (ref 5–15)
BUN: 42 mg/dL — ABNORMAL HIGH (ref 8–23)
CO2: 24 mmol/L (ref 22–32)
Calcium: 8.3 mg/dL — ABNORMAL LOW (ref 8.9–10.3)
Chloride: 110 mmol/L (ref 98–111)
Creatinine, Ser: 2.5 mg/dL — ABNORMAL HIGH (ref 0.61–1.24)
GFR, Estimated: 25 mL/min — ABNORMAL LOW (ref >60)
Glucose, Bld: 109 mg/dL — ABNORMAL HIGH (ref 70–99)
Potassium: 4.4 mmol/L (ref 3.5–5.1)
Sodium: 141 mmol/L (ref 135–145)

## 2022-04-27 LAB — CBC
HCT: 42 % (ref 39.0–52.0)
Hemoglobin: 13.4 g/dL (ref 13.0–17.0)
MCH: 29.8 pg (ref 26.0–34.0)
MCHC: 31.9 g/dL (ref 30.0–36.0)
MCV: 93.3 fL (ref 80.0–100.0)
Platelets: 302 10*3/uL (ref 150–400)
RBC: 4.5 MIL/uL (ref 4.22–5.81)
RDW: 16 % — ABNORMAL HIGH (ref 11.5–15.5)
WBC: 9.5 10*3/uL (ref 4.0–10.5)
nRBC: 0 % (ref 0.0–0.2)

## 2022-04-27 LAB — RESPIRATORY PANEL BY PCR

## 2022-04-27 LAB — COOXEMETRY PANEL
Carboxyhemoglobin: 1.7 % — ABNORMAL HIGH (ref 0.5–1.5)
Carboxyhemoglobin: 1.9 % — ABNORMAL HIGH (ref 0.5–1.5)
Methemoglobin: 0.7 % (ref 0.0–1.5)
Methemoglobin: <0.7 % (ref 0.0–1.5)
O2 Saturation: 72.8 %
O2 Saturation: 76.8 %
Total hemoglobin: 12.9 g/dL (ref 12.0–16.0)
Total hemoglobin: 8 g/dL — ABNORMAL LOW (ref 12.0–16.0)

## 2022-04-27 LAB — GLUCOSE, CAPILLARY
Glucose-Capillary: 100 mg/dL — ABNORMAL HIGH (ref 70–99)
Glucose-Capillary: 123 mg/dL — ABNORMAL HIGH (ref 70–99)
Glucose-Capillary: 150 mg/dL — ABNORMAL HIGH (ref 70–99)
Glucose-Capillary: 80 mg/dL (ref 70–99)
Glucose-Capillary: 88 mg/dL (ref 70–99)
Glucose-Capillary: 90 mg/dL (ref 70–99)
Glucose-Capillary: 90 mg/dL (ref 70–99)

## 2022-04-27 LAB — PROTEIN, PLEURAL OR PERITONEAL FLUID: Total protein, fluid: <3 g/dL

## 2022-04-27 LAB — BODY FLUID CELL COUNT WITH DIFFERENTIAL
Eos, Fluid: 0 %
Lymphs, Fluid: 72 %
Monocyte-Macrophage-Serous Fluid: 8 % — ABNORMAL LOW (ref 50–90)
Neutrophil Count, Fluid: 20 % (ref 0–25)
Total Nucleated Cell Count, Fluid: 193 cu mm (ref 0–1000)

## 2022-04-27 LAB — PROCALCITONIN: Procalcitonin: <0.1 ng/mL

## 2022-04-27 LAB — LACTATE DEHYDROGENASE: LDH: 137 U/L (ref 98–192)

## 2022-04-27 LAB — GLUCOSE, PLEURAL OR PERITONEAL FLUID: Glucose, Fluid: 101 mg/dL

## 2022-04-27 LAB — PROTEIN, TOTAL: Total Protein: 4.5 g/dL — ABNORMAL LOW (ref 6.5–8.1)

## 2022-04-27 LAB — LACTATE DEHYDROGENASE, PLEURAL OR PERITONEAL FLUID: LD, Fluid: 36 U/L — ABNORMAL HIGH (ref 3–23)

## 2022-04-27 SURGERY — THORACENTESIS
Anesthesia: LOCAL

## 2022-04-27 MED ORDER — METOPROLOL SUCCINATE ER 25 MG PO TB24
37.5000 mg | ORAL_TABLET | Freq: Two times a day (BID) | ORAL | Status: DC
  Administered 2022-04-27 (×2): 37.5 mg via ORAL
  Filled 2022-04-27 (×2): qty 2

## 2022-04-27 MED ORDER — INSULIN ASPART 100 UNIT/ML IJ SOLN
0.0000 [IU] | Freq: Every day | INTRAMUSCULAR | Status: DC
  Administered 2022-04-29 – 2022-05-05 (×2): 2 [IU] via SUBCUTANEOUS
  Administered 2022-05-06: 3 [IU] via SUBCUTANEOUS

## 2022-04-27 MED ORDER — METOLAZONE 2.5 MG PO TABS
2.5000 mg | ORAL_TABLET | Freq: Once | ORAL | Status: AC
  Administered 2022-04-27: 2.5 mg via ORAL
  Filled 2022-04-27: qty 1

## 2022-04-27 MED ORDER — INSULIN ASPART 100 UNIT/ML IJ SOLN
0.0000 [IU] | Freq: Three times a day (TID) | INTRAMUSCULAR | Status: DC
  Administered 2022-04-28 – 2022-04-29 (×2): 2 [IU] via SUBCUTANEOUS
  Administered 2022-04-29: 3 [IU] via SUBCUTANEOUS
  Administered 2022-04-30 – 2022-05-03 (×6): 2 [IU] via SUBCUTANEOUS
  Administered 2022-05-05 – 2022-05-06 (×2): 3 [IU] via SUBCUTANEOUS
  Administered 2022-05-06: 8 [IU] via SUBCUTANEOUS
  Administered 2022-05-06 – 2022-05-07 (×3): 3 [IU] via SUBCUTANEOUS
  Administered 2022-05-07: 5 [IU] via SUBCUTANEOUS
  Administered 2022-05-08: 2 [IU] via SUBCUTANEOUS

## 2022-04-27 MED ORDER — POTASSIUM CHLORIDE CRYS ER 20 MEQ PO TBCR
20.0000 meq | EXTENDED_RELEASE_TABLET | Freq: Two times a day (BID) | ORAL | Status: DC
  Administered 2022-04-27 – 2022-04-30 (×7): 20 meq via ORAL
  Filled 2022-04-27 (×7): qty 1

## 2022-04-27 NOTE — Assessment & Plan Note (Addendum)
Admitted Apr 2022 with bilateral infiltrates and found to have amio lung toxicity, completed prolonged prednisone taper and was improving, but CT 9/22 still showed amio associated lung fibrosis.  Doubt this is primary driver at this time.

## 2022-04-27 NOTE — Assessment & Plan Note (Addendum)
1.5L taken off left lung.  Transudate.  Breathing immediately better.

## 2022-04-27 NOTE — Procedures (Signed)
Thoracentesis  Procedure Note  DIALLO PONDER  [id number]  Jun 29, 1940  Date:04/27/22  Time:1:55 PM   Provider Performing:Jeury Mcnab W Mikey Bussing   Procedure: Thoracentesis with imaging guidance ([id number])  Indication(s) Pleural Effusion  Consent Risks of the procedure as well as the alternatives and risks of each were explained to the patient and/or caregiver.  Consent for the procedure was obtained and is signed in the bedside chart  Anesthesia Topical only with 1% lidocaine    Time Out Verified patient identification, verified procedure, site/side was marked, verified correct patient position, special equipment/implants available, medications/allergies/relevant history reviewed, required imaging and test results available.   Sterile Technique Maximal sterile technique including full sterile barrier drape, hand hygiene, sterile gown, sterile gloves, mask, hair covering, sterile ultrasound probe cover (if used).  Procedure Description Ultrasound was used to identify appropriate pleural anatomy for placement and overlying skin marked.  Area of drainage cleaned and draped in sterile fashion. Lidocaine was used to anesthetize the skin and subcutaneous tissue.  1500 cc's of clear straw colored  fluid was drained from the right pleural space. Catheter then removed and bandaid applied to site.   Complications/Tolerance None; patient tolerated the procedure well. Chest X-ray is ordered to confirm no post-procedural complication.   EBL Minimal   Specimen(s) Pleural fluid    Joneen Roach, AGACNP-BC [facility]  See Amion for personal pager PCCM on call pager [phone] until 7pm. Please call [place] 7p-7a. [phone]  04/27/2022 1:55 PM

## 2022-04-27 NOTE — Assessment & Plan Note (Addendum)
BMI >30 

## 2022-04-27 NOTE — Progress Notes (Signed)
Pt. With RED mew due to elevated HR. On call for [facility] paged to make aware.

## 2022-04-27 NOTE — Progress Notes (Signed)
  Progress Note   Patient: David Huynh FXT:[id number] DOB: 04/04/1941 DOA: 04/26/2022     0 DOS: the patient was seen and examined on 04/27/2022        Brief hospital course: David Huynh is an 81 y.o. M with sdCHF EF 45-50%, pAF on Eliquis, obesity, DM, CKD IIIb, HTN and hx amio lung fibrosis who prsented with progressive SOB and edema.  Wife reported poor compliance with meds.    12/13: Admitted in respiratory distress on BiPAP, Lasix started; Cardiology consulted 12/14: Still on BiPAP; Pulmonology consulted; underwent thoracentesis     Assessment and Plan: * Acute on chronic combined systolic and diastolic CHF (congestive heart failure) (HCC) Presented with leg swelling, dyspnea on exertion, CXR showing bilateral opacities and pleural effusions.  Net negative 2L yesterday, Cr stable, K stable - Continue Lasix drip and metolazone - Start K supplement - Strict I/Os, daily weights, telemetry  - Daily monitoring renal function - Consult Cardiology, appreciate cares  - Hold Entresto, spironolactone given AKI  Atrial fibrillation with RVR (HCC) HR some improvement.  Amiodarone contraindicated.   - Continue metoprolol - Continue diuresis   Acute-on-chronic kidney injury (HCC) Chronic kidney disease stage IIIa baseline 1.6, was 2.5 mg/dL on admission Cr stable, no change - Daily BMP - Hold Entresto and spironolactone until Cr clearer  A[place]orone associated Pulmonary fibrosis (HCC) Admitted Apr 2022 with bilateral infiltrates and found to have amio lung toxicity, completed prolonged prednisone taper and was improving, but CT 9/22 still showed amio associated lung fibrosis. - Consult Pulmonology  Bilateral pleural effusion Suspect these were larger than they appeared on 1V CXR.  Pulmonlogy have graciously agreed to evaluate patient and will tap one or both. - Follow fluid studies  Type 2 diabetes mellitus with stage 3a chronic kidney disease, with long-term current use  of insulin (HCC) - Continue SS corrections - Resume Crestor - Monitor off Glaragine for now  Acute respiratory failure with hypoxia (HCC) Prsented with tachynea, respiratory distres and requiring BiPAP.  Aortic stenosis, mild This does not appear on echo to be significantly different from baseline  Cardiomyopathy, ischemic    Essential hypertension BP controlled - Continue hydralazine, Imdur, metoprolol, furosemide - Stop nitro paste  Mild persistent asthma No wheezing  Hyperlipidemia with target LDL less than 70 - Continue Crestor  Obesity BMI 39          Subjective: Still short of breath, swelling is slightly better, making good urine output, no fever.     Physical Exam: BP 127/86   Pulse 84   Temp 98 F (36.7 C) (Oral)   Resp (!) 29   Ht 6' (1.829 m)   Wt 130.9 kg   SpO2 99%   BMI 39.14 kg/m   Elderly adult male, lying in bed, still on BiPAP, RRR, I do not appreciate murmurs, he is pitting edema up to the knee, also pitting edema in the right arm Respiratory rate normal on BiPAP, lung sounds diminished bilaterally, no wheezing appreciated Abdomen soft without tenderness palpation or guarding Attention normal, affect appropriate, judgment and insight appear normal    Data Reviewed: Discussed with pulmonology Coags shows ScVO 76, normal CBC normal Creatinine 2.58 basic metabolic panel with no change  Family Communication: None present    Disposition: Status is: Inpatient         Author: Alberteen Sam, MD 04/27/2022 3:18 PM  For on call review [email or web address].

## 2022-04-27 NOTE — Progress Notes (Addendum)
Advanced Heart Failure Rounding Note  PCP-Cardiologist: [name], MD   Subjective:    PICC placed yesterday. Initial Co-ox 73%. Remains stable at 77% today.   In and out of Afib. Had short period of SR yesterday. Back in Afib today, 120s. Requiring BiPAP.   3L in UOP yesterday on lasix gtt at 15/hr. Suspect wts not accurate, 274>>288 lb. CVP 10. C/w marked edema.   Scr relatively stable overnight, 2.48>>2.50 K 4.4  Echo with EF 40-45%, RV mildly reduced. Mod AS by AVA and DI. AVA 1.50 cm2, DI 0.34. MG 17 mmHG. Mod left pleural effusion.   Still feels SOB but slightly improved from admission.  Objective:   Weight Range: 130.9 kg Body mass index is 39.14 kg/m.   Vital Signs:   Temp:  [94.7 F (34.8 C)-98.4 F (36.9 C)] 98.4 F (36.9 C) (12/14 0523) Pulse Rate:  [75-132] 127 (12/14 0523) Resp:  [15-41] 29 (12/14 0012) BP: (117-178)/(73-125) 124/89 (12/14 0523) SpO2:  [94 %-100 %] 100 % (12/14 0317) FiO2 (%):  [40 %] 40 % (12/14 0317) Weight:  [124.3 kg-130.9 kg] 130.9 kg (12/14 0522) Last BM Date : 04/27/22  Weight change: Filed Weights   04/26/22 0103 04/26/22 1743 04/27/22 0522  Weight: 124.3 kg 124.3 kg 130.9 kg    Intake/Output:   Intake/Output Summary (Last 24 hours) at 04/27/2022 0712 Last data filed at 04/27/2022 0537 Gross per 24 hour  Intake 570.94 ml  Output 2950 ml  Net -2379.06 ml      Physical Exam    CVP 10  General:  fatigued appearing. No resp difficulty HEENT: Normal Neck: Supple. JVP 10 cm. Carotids 2+ bilat; no bruits. No lymphadenopathy or thyromegaly appreciated. Cor: PMI nondisplaced. Irregularly irregular rhythm, tachy rate. No rubs, gallops or murmurs. Lungs: decreased BS at the bases b/l  Abdomen: Soft, nontender, nondistended. No hepatosplenomegaly. No bruits or masses. Good bowel sounds. Extremities: No cyanosis, clubbing, rash, 3+ b/l LE edema Neuro: Alert & orientedx3, cranial nerves grossly intact. moves all 4  extremities w/o difficulty. Affect pleasant   Telemetry   AFib 120s, occasional PVCs  EKG    N/A   Labs    CBC Recent Labs    04/26/22 0100 04/27/22 0000  WBC 9.9 9.5  HGB 14.5 13.4  HCT 45.8 42.0  MCV 92.5 93.3  PLT 340 302   Basic Metabolic Panel Recent Labs    21/30/86 0100 04/27/22 0000  NA 138 141  K 4.1 4.4  CL 111 110  CO2 19* 24  GLUCOSE 90 109*  BUN 42* 42*  CREATININE 2.48* 2.50*  CALCIUM 8.2* 8.3*   Liver Function Tests Recent Labs    04/26/22 0306  AST 16  ALT 10  ALKPHOS 63  BILITOT 0.7  PROT 5.7*  ALBUMIN 1.8*   No results for input(s): "LIPASE", "AMYLASE" in the last 72 hours. Cardiac Enzymes No results for input(s): "CKTOTAL", "CKMB", "CKMBINDEX", "TROPONINI" in the last 72 hours.  BNP: BNP (last 3 results) Recent Labs    04/26/22 0100  BNP 1,368.4*    ProBNP (last 3 results) No results for input(s): "PROBNP" in the last 8760 hours.   D-Dimer No results for input(s): "DDIMER" in the last 72 hours. Hemoglobin A1C Recent Labs    04/26/22 1424  HGBA1C 5.8*   Fasting Lipid Panel No results for input(s): "CHOL", "HDL", "LDLCALC", "TRIG", "CHOLHDL", "LDLDIRECT" in the last 72 hours. Thyroid Function Tests No results for input(s): "TSH", "T4TOTAL", "T3FREE", "THYROIDAB" in  the last 72 hours.  Invalid input(s): "FREET3"  Other results:   Imaging    DG CHEST PORT 1 VIEW  Result Date: 04/26/2022 CLINICAL DATA:  Status post PICC line EXAM: PORTABLE CHEST 1 VIEW COMPARISON:  Chest x-ray earlier same day FINDINGS: Right upper extremity PICC terminates over the distal SVC. There is no pneumothorax. The cardiomediastinal silhouette is within normal limits. There is central pulmonary vascular congestion and patchy bibasilar infiltrates with small pleural effusions similar to the prior study. The osseous structures are stable. IMPRESSION: 1. Right upper extremity PICC terminates over the distal SVC. 2. Central pulmonary vascular  congestion and patchy bibasilar infiltrates with small pleural effusions similar to the prior study. Electronically Signed   By: Ronney Asters M.D.   On: 04/26/2022 19:38   ECHOCARDIOGRAM COMPLETE  Result Date: 04/26/2022    ECHOCARDIOGRAM REPORT   Patient Name:   LENFORD ADDY Date of Exam: 04/26/2022 Medical Rec #:  [id number]         Height:       72.0 in Accession #:    [id number]        Weight:       274.0 lb Date of Birth:  03-04-1941         BSA:          2.436 m Patient Age:    81 years          BP:           137/99 mmHg Patient Gender: M                 HR:           127 bpm. Exam Location:  Inpatient Procedure: 2D Echo, Cardiac Doppler, Color Doppler and Intracardiac            Opacification Agent Indications:    I50.40* Unspecified combined systolic (congestive) and diastolic                 (congestive) heart failure  History:        Patient has prior history of Echocardiogram examinations. CHF,                 CAD, Aortic Valve Disease, Signs/Symptoms:Dyspnea and Shortness                 of Breath; Risk Factors:Hypertension, Diabetes and Dyslipidemia.  Sonographer:    Roseanna Rainbow RDCS Referring Phys: [phone] JARED M GARDNER  Sonographer Comments: Technically difficult study due to poor echo windows, suboptimal parasternal window, suboptimal subcostal window and suboptimal apical window. Image acquisition challenging due to patient body habitus. Patient supine on CPAP, could not turn. Suboptimal study IMPRESSIONS  1. Vmax 2.5 m/s, MG 17 mmHG, AVA 1.50 cm2, DI 0.34. Suspect gradients lower than expected due to reduced EF. Based on AVA and DI, AS is moderate. The aortic valve is tricuspid. There is severe calcifcation of the aortic valve. There is severe thickening  of the aortic valve. Aortic valve regurgitation is not visualized. Moderate aortic valve stenosis. Aortic valve area, by VTI measures 1.50 cm. Aortic valve mean gradient measures 17.0 mmHg. Aortic valve Vmax measures 2.45 m/s.  2. Left  ventricular ejection fraction, by estimation, is 40 to 45%. The left ventricle has mildly decreased function. The left ventricle demonstrates global hypokinesis. Left ventricular diastolic function could not be evaluated.  3. Right ventricular systolic function is mildly reduced. The right ventricular size is mildly enlarged. Tricuspid regurgitation signal is inadequate  for assessing PA pressure.  4. Left atrial size was mildly dilated.  5. Moderate pleural effusion in the left lateral region.  6. The mitral valve is grossly normal. Mild mitral valve regurgitation. No evidence of mitral stenosis.  7. The inferior vena cava is dilated in size with <50% respiratory variability, suggesting right atrial pressure of 15 mmHg. Comparison(s): Changes from prior study are noted. EF 40-45% on this study. In Afib with RVR. Suspect moderate AS. FINDINGS  Left Ventricle: Left ventricular ejection fraction, by estimation, is 40 to 45%. The left ventricle has mildly decreased function. The left ventricle demonstrates global hypokinesis. The left ventricular internal cavity size was normal in size. There is  no left ventricular hypertrophy. Left ventricular diastolic function could not be evaluated due to atrial fibrillation. Left ventricular diastolic function could not be evaluated. Right Ventricle: The right ventricular size is mildly enlarged. No increase in right ventricular wall thickness. Right ventricular systolic function is mildly reduced. Tricuspid regurgitation signal is inadequate for assessing PA pressure. Left Atrium: Left atrial size was mildly dilated. Right Atrium: Right atrial size was normal in size. Pericardium: There is no evidence of pericardial effusion. Mitral Valve: The mitral valve is grossly normal. Mild mitral valve regurgitation. No evidence of mitral valve stenosis. Tricuspid Valve: The tricuspid valve is grossly normal. Tricuspid valve regurgitation is trivial. No evidence of tricuspid stenosis.  Aortic Valve: Vmax 2.5 m/s, MG 17 mmHG, AVA 1.50 cm2, DI 0.34. Suspect gradients lower than expected due to reduced EF. Based on AVA and DI, AS is moderate. The aortic valve is tricuspid. There is severe calcifcation of the aortic valve. There is severe thickening of the aortic valve. Aortic valve regurgitation is not visualized. Moderate aortic stenosis is present. Aortic valve mean gradient measures 17.0 mmHg. Aortic valve peak gradient measures 24.1 mmHg. Aortic valve area, by VTI measures 1.50 cm. Pulmonic Valve: The pulmonic valve was grossly normal. Pulmonic valve regurgitation is not visualized. No evidence of pulmonic stenosis. Aorta: The aortic root and ascending aorta are structurally normal, with no evidence of dilitation. Venous: The inferior vena cava is dilated in size with less than 50% respiratory variability, suggesting right atrial pressure of 15 mmHg. IAS/Shunts: The interatrial septum was not well visualized. Additional Comments: There is a moderate pleural effusion in the left lateral region.  LEFT VENTRICLE PLAX 2D LVIDd:         5.20 cm      Diastology LVIDs:         4.60 cm      LV e' medial:    5.66 cm/s LV PW:         1.10 cm      LV E/e' medial:  17.1 LV IVS:        1.10 cm      LV e' lateral:   11.70 cm/s LVOT diam:     2.40 cm      LV E/e' lateral: 8.3 LV SV:         62 LV SV Index:   26 LVOT Area:     4.52 cm  LV Volumes (MOD) LV vol d, MOD A2C: 170.0 ml LV vol d, MOD A4C: 174.0 ml LV vol s, MOD A2C: 104.0 ml LV vol s, MOD A4C: 122.0 ml LV SV MOD A2C:     66.0 ml LV SV MOD A4C:     174.0 ml LV SV MOD BP:      55.7 ml RIGHT VENTRICLE  IVC RV S prime:     9.79 cm/s  IVC diam: 2.50 cm TAPSE (M-mode): 1.7 cm LEFT ATRIUM           Index        RIGHT ATRIUM           Index LA diam:      4.10 cm 1.68 cm/m   RA Area:     17.00 cm LA Vol (A2C): 19.4 ml 7.96 ml/m   RA Volume:   52.60 ml  21.60 ml/m LA Vol (A4C): 76.4 ml 31.37 ml/m  AORTIC VALVE AV Area (Vmax):    1.42 cm AV Area  (Vmean):   1.39 cm AV Area (VTI):     1.50 cm AV Vmax:           245.33 cm/s AV Vmean:          173.333 cm/s AV VTI:            0.417 m AV Peak Grad:      24.1 mmHg AV Mean Grad:      17.0 mmHg LVOT Vmax:         77.10 cm/s LVOT Vmean:        53.300 cm/s LVOT VTI:          0.138 m LVOT/AV VTI ratio: 0.33  AORTA Ao Root diam: 3.70 cm Ao Asc diam:  3.75 cm MITRAL VALVE MV Area (PHT): 8.25 cm    SHUNTS MV Decel Time: 92 msec     Systemic VTI:  0.14 m MV E velocity: 96.90 cm/s  Systemic Diam: 2.40 cm Eleonore Chiquito MD Electronically signed by Eleonore Chiquito MD Signature Date/Time: 04/26/2022/11:29:11 AM    Final    [place] EKG SITE RITE  Result Date: 04/26/2022 If Site Rite image not attached, placement could not be confirmed due to current cardiac rhythm.    Medications:     Scheduled Medications:  apixaban  2.5 mg Oral BID   Chlorhexidine Gluconate Cloth  6 each Topical Daily   hydrALAZINE  12.5 mg Oral Q8H   insulin aspart  0-9 Units Subcutaneous Q4H   isosorbide mononitrate  15 mg Oral Daily   metoprolol succinate  25 mg Oral BID   rosuvastatin  40 mg Oral Daily   sodium chloride flush  10-40 mL Intracatheter Q12H   sodium chloride flush  3 mL Intravenous Q12H    Infusions:  sodium chloride     furosemide (LASIX) 200 mg in dextrose 5 % 100 mL (2 mg/mL) infusion 15 mg/hr (04/27/22 0000)    PRN Medications: sodium chloride, acetaminophen, hydrALAZINE, labetalol, ondansetron (ZOFRAN) IV, sodium chloride flush, sodium chloride flush    Patient Profile   81 y.o. male with a history of CAD, chronic systolic CHF, HTN, HLD, DM, CKD 3, asthma, and PAF w/ h/o amiodarone lung toxicity, admitted w/ a/c CHF and recurrent AF w/ RVR.   Assessment/Plan   1. Acute on chronic CHF: Prior ischemic cardiomyopathy, improved after PCI in 2017.  Echo 03/2018 with EF back down to 35-40% and moderate-severe RV dysfunction. This was in the setting of atrial fibrillation with RVR. EF 40% on TEE 04/2018. Echo  in 3/20 showed EF up to 50%.  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  Echo in 7/21 with EF 25-30%, severe RV dysfunction.  TEE in 10/21, however, showed EF up to 50%.  He had PCI to LAD in 10/21. Echo in 3/22 showed stable EF 50-55%  with normal RV.   Echo in 8/22 with EF 45-50%, moderate LVH, normal RV, moderate AS.  He returns with marked volume overload in setting of recurrent AF with RVR.  No evidence for ACS, suspect exacerbation may be due to AF/RVR. He has been lost to follow-up. Echo this admit EF 40-45%, RV mildly reduced, moderate AS. PICC placed. Initial Co-ox 73%>> 77% today. CVP 10, c/w marked 3rd spacing. 3L in UOP on lasix gtt at 15/hr. SCr 2.5 (stable since admission) - continue Lasix gtt at 15/hr + give 2.5 mg of metolazone - apply unna boots  - Hydralazine/Imdur for afterload reduction with elevated creatinine and high BP.   - Ultimately needs DCCV once diuresed.  2. Atrial fibrillation: As above, concerned for possible tachy-mediated CMP.  Had successful DC-CV on 03/29/18 but then back in atrial fibrillation. Ranolazine added, but failed DCCV again 04/02/18. DCCV 04/19/18 was successful.  AF with RVR at 3/22 admission, amiodarone restarted and he was cardioverted to NSR, but he developed amiodarone lung toxicity.  Amiodarone was stopped. Now s/p atrial fibrillation ablation in 6/22.  He is now back in AF with RVR, not sure how long. Suspect this triggered recurrent CHF.  He does not appear to still be on ranolazine at home.  HR 120s currently.  - No amiodarone with history of lung toxicity, anti-arrhythmic options limited as he also has AKI on CKD stage 3.    - Continue Eliquis 5 mg bid (has missed doses recently).  - Toprol XL 25 mg bid for now to try to rate control (on Coreg at home).  - EP has seen this admit. No good options for rhythm control acutely. He should follow-up with EP when stable to consider ablation candidacy.  3. AKI on CKD stage 3:  Creatinine baseline in past (1 year ago) was around 1.6.   Creatinine today is 2.48.  ?Cardiorenal.  - Hold Entresto, spironolactone for now (not totally clear that he was taking).  - Diurese as above, hopefully decrease in renal venous pressure will help renal function.  4. CAD: History of DES to RCA in 9/17.  Underwent LHC 04/03/18 with PCI to prox left circ. LHC in 10/21 with PCI to LAD.  No chest pain this admission.  Mild elevation in TnI but no trend, doubt ACS.  - He is on Eliquis, so no ASA.   - Continue statin.  - Defer cath with elevated creatinine and no ACS.  5. Aortic stenosis: Moderate on 8/22 echo.  - Moderate on echo this admit based on AVA and DI. AVA 1.50 cm2, DI 0.34. MG 17 mmHG. EF 40-45%  6. Mitral regurgitation: Mild on 8/22 echo.  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  He improved with steroids.  8. Left Pleural Effusion: mod on echo - continue diuresis  - may need thoracentesis    Length of Stay: 0  Brittainy Simmons, PA-C  04/27/2022, 7:12 AM  Advanced Heart Failure Team Pager [phone] (M-F; 7a - 5p)  Please contact [facility] for night-coverage after hours (5p -7a ) and weekends on [email or web address]  Patient seen with PA, agree with the above note.   Good diuresis yesterday, weight not accurate (ER to floor).  Co-ox 77% with CVP 11-12 on my read.  Despite diuresis, he is not feeling better.  Was not able to stay off Bipap to eat breakfast.   HR remains 120s, atrial flutter.    General: NAD Neck: JVP  12-14 cm, no thyromegaly or thyroid nodule.  Lungs: Decreased at bases.  CV: Nondisplaced PMI.  Heart tachy, regular S1/S2, no S3/S4, 2/6 early SEM RUSB.  2+ edema to knees.  Abdomen: Soft, nontender, no hepatosplenomegaly, no distention.  Skin: Intact without lesions or rashes.  Neurologic: Alert and oriented x 3.  Psych: Normal affect. Extremities: No clubbing or cyanosis.  HEENT: Normal.   Patient remains volume  overloaded by exam, CVP elevated 11-12.  Creatinine stable at 2.5.  - Continue Lasix gtt 15 mg/hr.  Will give a dose of metolazone 2.5 x 1.  - Continue hydralazine/Imdur.   He remains in atrial flutter rate 120s.  Unable to use amiodarone with history of pulmonary toxicity.  I think his respiratory status remains too tenuous for cardioversion at this time.  - Increase Toprol XL to 37.5 mg bid.  - TEE-guided DCCV (not fully compliant with Eliquis at home) when respiratory status is better.  - If DCCV does not hold, will need to consider AV nodal ablation with BiV or LB pacing.   Patient's dyspnea and oxygen need seem out of proportion to degree of volume overload and did not improve as much as expected with good diuresis yesterday.  He has history of interstitial fibrosis from ?amiodarone lung toxicity on 9/22 CT chest.  Suspect residual pulmonary fibrosis.  Question if pleural effusion on left is large enough for thoracentesis.  - I will ask pulmonary to see today, ?thoracentesis.   Loralie Champagne 04/27/2022 8:01 AM

## 2022-04-27 NOTE — Plan of Care (Signed)
  Problem: Nutritional: Goal: Maintenance of adequate nutrition will improve Outcome: Progressing   Problem: Activity: Goal: Risk for activity intolerance will decrease Outcome: Progressing   

## 2022-04-27 NOTE — Assessment & Plan Note (Addendum)
No wheezing - PRN levalbuterol

## 2022-04-27 NOTE — Consult Note (Signed)
WOC Nurse Consult Note: Reason for Consult: Lower extremity edema and blistering in the setting of acute on chronic CHF.  He is on BiPAP and responds verbally when I speak with him.  He states he has worn compression socks in the past.  His legs are edematous with erythema and blistering to right lateral lower leg Wound type: inflammatory Pressure Injury POA: NA Measurement: 4 cm x 4 cm area of erythema with 0.1 cm scattered blisters Wound bed: pink and moist Drainage (amount, consistency, odor) minimal serous weeping Periwound: edema, erythema States he has insect bites on his legs as well.   Dressing procedure/placement/frequency: Unna boots to bilateral lower legs. No further wound care needed.  Will not follow at this time.  Please re-consult if needed.  Mike Gip MSN, RN, FNP-BC CWON Wound, Ostomy, Continence Nurse Outpatient [facility] [phone] Pager [phone]

## 2022-04-27 NOTE — Consult Note (Signed)
NAME:  David Huynh, MRN:  [id number], DOB:  1941/01/30, LOS: 0 ADMISSION DATE:  04/26/2022, CONSULTATION DATE:  12/14 REFERRING MD:  Dr. Maryfrances Bunnell, CHIEF COMPLAINT:  SOB   History of Present Illness:  81 year old male with PMH as below, which is significant for HFrEF 40%, Moderate AS, PAF, CAD s/p PCI, amiodarone lung toxicity (improved with steroids), CKD, and DM. He presented to [facility] ED 12/13 with complaints of SOB progressive x 1 month as well as worsening lower extremity edema. He was started on BiPAP for work of breathing. He was admitted to the hospitalist service for CHF exacerbation and AF RVR. Cardiology was consulted as well and initiated diuresis with lasix infusion. Despite 2.5 liters of diuresis the patient continued to require BiPAP and bilateral pleural effusions noted on CXR. PCCM asked to evaluate.   Pertinent  Medical History   has a past medical history of Allergy, Asthma, Atrial fibrillation with rapid ventricular response (HCC) (03/25/2018), CAD, multiple vessel, Carotid bruit (08/05/2010), Chronic combined systolic and diastolic CHF (congestive heart failure) (HCC), CKD (chronic kidney disease), stage III (HCC), Diabetes mellitus, Fracture of multiple ribs (02/12/2015), Hemothorax on right (01/23/2015), Hyperlipidemia, Hypertension, Hypocalcemia (02/12/2015), Lactic acidosis (03/25/2018), Liver function study, abnormal (04/26/2011), Multiple rib fractures, Obesity, Persistent atrial fibrillation (HCC), and Pneumonia.   Significant Hospital Events: Including procedures, antibiotic start and stop dates in addition to other pertinent events   12/13 admit for CHF, AF RVR  Interim History / Subjective:    Objective   Blood pressure (!) 116/94, pulse 95, temperature 98 F (36.7 C), temperature source Axillary, resp. rate 20, height 6' (1.829 m), weight 130.9 kg, SpO2 96 %. CVP:  [0 mmHg-9 mmHg] 9 mmHg  Vent Mode: PCV;BIPAP FiO2 (%):  [40 %] 40 % Set Rate:  [12 bmp] 12  bmp PEEP:  [5 cmH20] 5 cmH20   Intake/Output Summary (Last 24 hours) at 04/27/2022 1147 Last data filed at 04/27/2022 0900 Gross per 24 hour  Intake 878.7 ml  Output 2450 ml  Net -1571.3 ml   Filed Weights   04/26/22 0103 04/26/22 1743 04/27/22 0522  Weight: 124.3 kg 124.3 kg 130.9 kg    Examination: General: Obese elderly gentleman in comfortable on BiPAP.  HENT: [place]/AT, PERRL, no JVD Lungs: Diminished bases Cardiovascular: RRR, no MRG Abdomen: Protuberant, soft, non-tender.  Extremities: 3+ pitting edema up to bilateral knees. Dependent upper extremity pitting edema as well.  Neuro: Alert, oriented, non-focal  Resolved Hospital Problem list     Assessment & Plan:   Acute on chronic HFrEF: known LVEF 40% here with shortness of breath and lower extremity edema. - Diuresis and cardiac therapies per heart failure team - Lasix infusion + metolazone - Afterload reduction  Acute respiratory failure with hypoxia: secondary to CHF exacerbation and compressive atelectasis in the setting of L>R bilateral pleural effusions.  - Continue BiPAP - Ongoing diuresis as above - Will plan for thoracentesis at 1300 today in endo for symptomatic relief and pleural fluid studies.  - Titrate FiO2 to keep SpO2 > 92%  Atrial fibrillation with RVR: CAD AS, MR - Management per cardiology - Eliquis - Toprol - No good rhythm control options (hx of amiodarone pulm tox) - Considering DCCV once more stable.  - Hold entresto due to AKI - Statin  AKI on CKD 3 - per primary  Best Practice (right click and "Reselect all SmartList Selections" daily)   Per primary  Labs   CBC: Recent Labs  Lab 04/26/22  0100 04/27/22 0000  WBC 9.9 9.5  HGB 14.5 13.4  HCT 45.8 42.0  MCV 92.5 93.3  PLT 340 302    Basic Metabolic Panel: Recent Labs  Lab 04/26/22 0100 04/27/22 0000  NA 138 141  K 4.1 4.4  CL 111 110  CO2 19* 24  GLUCOSE 90 109*  BUN 42* 42*  CREATININE 2.48* 2.50*  CALCIUM  8.2* 8.3*   GFR: Estimated Creatinine Clearance: 33 mL/min (A) (by C-G formula based on SCr of 2.5 mg/dL (H)). Recent Labs  Lab 04/26/22 0100 04/27/22 0000  WBC 9.9 9.5    Liver Function Tests: Recent Labs  Lab 04/26/22 0306  AST 16  ALT 10  ALKPHOS 63  BILITOT 0.7  PROT 5.7*  ALBUMIN 1.8*   No results for input(s): "LIPASE", "AMYLASE" in the last 168 hours. No results for input(s): "AMMONIA" in the last 168 hours.  ABG    Component Value Date/Time   PHART 7.434 01/25/2015 0350   PCO2ART 40.9 01/25/2015 0350   PO2ART 81.0 01/25/2015 0350   HCO3 28.7 (H) 03/02/2020 1034   HCO3 29.4 (H) 03/02/2020 1034   TCO2 24 10/26/2020 0730   ACIDBASEDEF 1.0 01/24/2015 0826   O2SAT 76.8 04/27/2022 0643     Coagulation Profile: No results for input(s): "INR", "PROTIME" in the last 168 hours.  Cardiac Enzymes: No results for input(s): "CKTOTAL", "CKMB", "CKMBINDEX", "TROPONINI" in the last 168 hours.  HbA1C: Hemoglobin A1C  Date/Time Value Ref Range Status  09/22/2021 01:32 PM 7.7 (A) 4.0 - 5.6 % Final  03/15/2021 12:58 PM 8.6 (A) 4.0 - 5.6 % Final   Hgb A1c MFr Bld  Date/Time Value Ref Range Status  04/26/2022 02:24 PM 5.8 (H) 4.8 - 5.6 % Final    Comment:    (NOTE)         Prediabetes: 5.7 - 6.4         Diabetes: >6.4         Glycemic control for adults with diabetes: <7.0   11/08/2020 11:40 AM 10.0 (H) 4.6 - 6.5 % Final    Comment:    Glycemic Control Guidelines for People with Diabetes:Non Diabetic:  <6%Goal of Therapy: <7%Additional Action Suggested:  >8%     CBG: Recent Labs  Lab 04/26/22 1749 04/26/22 2110 04/27/22 0006 04/27/22 0534 04/27/22 0738  GLUCAP 97 84 123* 80 88    Review of Systems:   Bolds are positive  Constitutional: weight loss, gain, night sweats, Fevers, chills, fatigue .  HEENT: headaches, Sore throat, sneezing, nasal congestion, post nasal drip, Difficulty swallowing, Tooth/dental problems, visual complaints visual changes, ear  ache CV:  chest pain, radiates:,Orthopnea, PND, swelling in lower extremities, dizziness, palpitations, syncope.  GI  heartburn, indigestion, abdominal pain, nausea, vomiting, diarrhea, change in bowel habits, loss of appetite, bloody stools.  Resp: cough, productive: , hemoptysis, dyspnea, chest pain, pleuritic.  Skin: rash or itching or icterus GU: dysuria, change in color of urine, urgency or frequency. flank pain, hematuria  MS: joint pain or swelling. decreased range of motion  Psych: change in mood or affect. depression or anxiety.  Neuro: difficulty with speech, weakness, numbness, ataxia    Past Medical History:  He,  has a past medical history of Allergy, Asthma, Atrial fibrillation with rapid ventricular response (HCC) (03/25/2018), CAD, multiple vessel, Carotid bruit (08/05/2010), Chronic combined systolic and diastolic CHF (congestive heart failure) (HCC), CKD (chronic kidney disease), stage III (HCC), Diabetes mellitus, Fracture of multiple ribs (02/12/2015), Hemothorax on right (01/23/2015),  Hyperlipidemia, Hypertension, Hypocalcemia (02/12/2015), Lactic acidosis (03/25/2018), Liver function study, abnormal (04/26/2011), Multiple rib fractures, Obesity, Persistent atrial fibrillation (HCC), and Pneumonia.   Surgical History:   Past Surgical History:  Procedure Laterality Date   APPENDECTOMY  1975   ATRIAL FIBRILLATION ABLATION N/A 10/26/2020   Procedure: ATRIAL FIBRILLATION ABLATION;  Surgeon: Hillis Range, MD;  Location: [facility] INVASIVE CV LAB;  Service: Cardiovascular;  Laterality: N/A;   CARDIAC CATHETERIZATION N/A 02/17/2016   Procedure: Left Heart Cath and Coronary Angiography;  Surgeon: Marykay Lex, MD;  Location: [facility] INVASIVE CV LAB;  Service: Cardiovascular: mRCA 85% (PCI), branch of OM2 80%,  ostD1 90%; EF 35-45%   CARDIAC CATHETERIZATION N/A 02/17/2016   Procedure: Coronary Stent Intervention;  Surgeon: Marykay Lex, MD;  Location: [facility] INVASIVE CV LAB;  Service:  Cardiovascular: mRCA PCI : Andre Lefort PREM MR [id number] DES   CARDIOVERSION N/A 03/29/2018   Procedure: CARDIOVERSION;  Surgeon: Laurey Morale, MD;  Location: [facility] ENDOSCOPY;  Service: Cardiovascular;  Laterality: N/A;   CARDIOVERSION N/A 04/02/2018   Procedure: CARDIOVERSION;  Surgeon: Laurey Morale, MD;  Location: [facility] ENDOSCOPY;  Service: Cardiovascular;  Laterality: N/A;   CARDIOVERSION N/A 04/19/2018   Procedure: CARDIOVERSION;  Surgeon: Laurey Morale, MD;  Location: [facility] ENDOSCOPY;  Service: Cardiovascular;  Laterality: N/A;   CARDIOVERSION N/A 08/11/2020   Procedure: CARDIOVERSION;  Surgeon: Laurey Morale, MD;  Location: [facility] ENDOSCOPY;  Service: Cardiovascular;  Laterality: N/A;   CARDIOVERSION N/A 12/28/2020   Procedure: CARDIOVERSION;  Surgeon: Laurey Morale, MD;  Location: [facility] ENDOSCOPY;  Service: Cardiovascular;  Laterality: N/A;   CATARACT EXTRACTION  2010   b/l   CORONARY STENT INTERVENTION N/A 04/03/2018   Procedure: CORONARY STENT INTERVENTION;  Surgeon: Lennette Bihari, MD;  Location: [facility] INVASIVE CV LAB;  Service: Cardiovascular;  Laterality: N/A;   CORONARY STENT INTERVENTION N/A 03/02/2020   Procedure: CORONARY STENT INTERVENTION;  Surgeon: Corky Crafts, MD;  Location: [facility] INVASIVE CV LAB;  Service: Cardiovascular;  Laterality: N/A;   INTRAVASCULAR ULTRASOUND/IVUS N/A 03/02/2020   Procedure: Intravascular Ultrasound/IVUS;  Surgeon: Corky Crafts, MD;  Location: [facility] INVASIVE CV LAB;  Service: Cardiovascular;  Laterality: N/A;   RETINAL LASER PROCEDURE  1985   RIGHT/LEFT HEART CATH AND CORONARY ANGIOGRAPHY N/A 04/03/2018   Procedure: RIGHT/LEFT HEART CATH AND CORONARY ANGIOGRAPHY;  Surgeon: Laurey Morale, MD;  Location: [facility] INVASIVE CV LAB;  Service: Cardiovascular;  Laterality: N/A;   RIGHT/LEFT HEART CATH AND CORONARY ANGIOGRAPHY N/A 03/02/2020   Procedure: RIGHT/LEFT HEART CATH AND CORONARY ANGIOGRAPHY;  Surgeon: Laurey Morale, MD;  Location: [facility] INVASIVE  CV LAB;  Service: Cardiovascular;  Laterality: N/A;   TEE WITHOUT CARDIOVERSION N/A 03/29/2018   Procedure: TRANSESOPHAGEAL ECHOCARDIOGRAM (TEE);  Surgeon: Laurey Morale, MD;  Location: [facility] ENDOSCOPY;  Service: Cardiovascular;  Laterality: N/A;   TEE WITHOUT CARDIOVERSION N/A 04/19/2018   Procedure: TRANSESOPHAGEAL ECHOCARDIOGRAM (TEE);  Surgeon: Laurey Morale, MD;  Location: [facility] ENDOSCOPY;  Service: Cardiovascular;  Laterality: N/A;   TEE WITHOUT CARDIOVERSION N/A 03/02/2020   Procedure: TRANSESOPHAGEAL ECHOCARDIOGRAM (TEE);  Surgeon: Laurey Morale, MD;  Location: [facility] ENDOSCOPY;  Service: Cardiovascular;  Laterality: N/A;   TEE WITHOUT CARDIOVERSION N/A 10/26/2020   Procedure: TRANSESOPHAGEAL ECHOCARDIOGRAM (TEE);  Surgeon: Elease Hashimoto Deloris Ping, MD;  Location: [facility] ENDOSCOPY;  Service: Cardiovascular;  Laterality: N/A;   TONSILLECTOMY AND ADENOIDECTOMY  1947   TRANSTHORACIC ECHOCARDIOGRAM  02/2016   Mild LVH. Moderately reduced EF of 35-40% with diffuse hypokinesis. Indeterminate diastolic function.  Likely elevated LA pressures. Mild aortic stenosis (mean gradient 10 mmHg). Mild MR. Mild LA dilation. Mild to moderate PA hypertension with 39 mmHg   VIDEO ASSISTED THORACOSCOPY (VATS)/THOROCOTOMY Right 01/24/2015   Procedure: VIDEO ASSISTED THORACOSCOPY (VATS) FOR DRAINAGE OF RIGHT HEMOTHORAX;  Surgeon: Alleen Borne, MD;  Location: [facility] OR;  Service: Thoracic;  Laterality: Right;     Social History:   reports that he has never smoked. He has never used smokeless tobacco. He reports current alcohol use. He reports that he does not use drugs.   Family History:  His family history is negative for CAD.   Allergies Allergies  Allergen Reactions   Amiodarone Shortness Of Breath    Pulmonary toxicity     Home Medications  Prior to Admission medications   Medication Sig Start Date End Date Taking? Authorizing Provider  acetaminophen (TYLENOL) 500 MG tablet Take 1,000 mg by mouth every 6 (six)  hours as needed for moderate pain or headache.   Yes [provider]  ADVAIR DISKUS 100-50 MCG/ACT AEPB INHALE 1 PUFF INTO THE LUNGS TWICE DAILY Patient taking differently: Inhale 1 puff into the lungs 2 (two) times daily. 02/20/22  Yes Bradd Canary, MD  albuterol (VENTOLIN HFA) 108 (90 Base) MCG/ACT inhaler Inhale 2 puffs into the lungs every 6 (six) hours as needed for wheezing or shortness of breath. 02/27/22  Yes Bradd Canary, MD  apixaban (ELIQUIS) 5 MG TABS tablet TAKE 1 TABLET(5 MG) BY MOUTH TWICE DAILY Patient taking differently: Take 5 mg by mouth 2 (two) times daily. 07/01/21  Yes Laurey Morale, MD  carvedilol (COREG) 25 MG tablet Take 12.5 mg by mouth 2 (two) times daily with a meal.   Yes [provider]  cholecalciferol (VITAMIN D) 1000 units tablet Take 1 tablet (1,000 Units total) by mouth daily. 12/09/19  Yes Amin, Ankit Chirag, MD  ezetimibe (ZETIA) 10 MG tablet TAKE 1 TABLET(10 MG) BY MOUTH DAILY Patient taking differently: Take 10 mg by mouth daily. 08/03/21  Yes Laurey Morale, MD  HUMALOG KWIKPEN 100 UNIT/ML KwikPen USE AS DIRECTED. MAX DOSE OF 15 UNITS 01/02/22  Yes Shamleffer, Konrad Dolores, MD  icosapent Ethyl (VASCEPA) 1 g capsule TAKE 2 CAPSULES(2 GRAMS) BY MOUTH TWICE DAILY Patient taking differently: Take 2 g by mouth 2 (two) times daily. 09/27/21  Yes Laurey Morale, MD  insulin glargine (LANTUS SOLOSTAR) 100 UNIT/ML Solostar Pen INJECT 12 UNITS INTO THE SKIN AT BEDTIME 03/18/22  Yes Shamleffer, Konrad Dolores, MD  metFORMIN (GLUCOPHAGE) 1000 MG tablet TAKE 1 TABLET(1000 MG) BY MOUTH DAILY WITH BREAKFAST Patient taking differently: Take 1,000 mg by mouth daily with breakfast. 04/27/21  Yes Shamleffer, Konrad Dolores, MD  nitroGLYCERIN (NITROSTAT) 0.4 MG SL tablet Place 1 tablet (0.4 mg total) under the tongue every 5 (five) minutes as needed for chest pain. 12/09/19  Yes Amin, Ankit Chirag, MD  rosuvastatin (CRESTOR) 40 MG tablet TAKE 1  TABLET(40 MG) BY MOUTH DAILY. Need lab work for future refills. Patient taking differently: Take 40 mg by mouth daily. 12/22/21  Yes Bradd Canary, MD  sacubitril-valsartan (ENTRESTO) 24-26 MG Take 1 tablet by mouth 2 (two) times daily. NEEDS FOLLOW UP APPOINTMENT FOR ANYMORE REFILLS 01/12/22  Yes Laurey Morale, MD  spironolactone (ALDACTONE) 25 MG tablet TAKE 1 TABLET(25 MG) BY MOUTH DAILY Patient taking differently: Take 25 mg by mouth daily. 03/20/22  Yes Milford, Anderson Malta, FNP  torsemide (DEMADEX) 10 MG tablet TAKE 1 TABLET(10 MG) BY  MOUTH IN THE MORNING Patient taking differently: Take 10 mg by mouth daily. 02/28/22  Yes Milford, [place], FNP  glucose blood test strip Use as instructed 09/03/20   Bradd Canary, MD  Insulin Pen Needle (PEN NEEDLES) 32G X 4 MM MISC Inject 1 Device into the skin in the morning, at noon, in the evening, and at bedtime. Use 1 at bedtime with Lantus.  Dx code: E11.9 11/30/20   Shamleffer, Konrad Dolores, MD     Critical care time:      Joneen Roach, AGACNP-BC [facility]  See Amion for personal pager PCCM on call pager [phone] until 7pm. Please call Elink 7p-7a. [phone]  04/27/2022 12:22 PM

## 2022-04-27 NOTE — Assessment & Plan Note (Signed)
Continue Crestor 

## 2022-04-27 NOTE — Care Plan (Signed)
Spoke with wife by phone.

## 2022-04-27 NOTE — Assessment & Plan Note (Addendum)
Presented with tachynea, respiratory distress and hypoxia requiring BiPAP.  Due to fluid overload.  Procal negative, doubt pneumonia.  Doubt pulmonary fibrosis is contributing.  Improved to 3L today.

## 2022-04-27 NOTE — Progress Notes (Signed)
Orthopedic Tech Progress Note Patient Details:  [name] 1940-06-25 [id number]  Dorene Grebe, RN is requesting wound care consult due to blistering on LLE. Blisters appear on the anterior portion of his left lower leg, roughly 12cm X 20cm area of 0.5-1cm scattered blisters. Previous WOC note from this morning only noted blistering on the RLE. Unna boot supplies are in his room and RN is aware of calling us once wound care follows up!  Patient ID: David Huynh, male   DOB: [birth year], 81 y.o.   MRN: [id number]  Docia Furl 04/27/2022, 2:23 PM

## 2022-04-28 DIAGNOSIS — E785 Hyperlipidemia, unspecified: Secondary | ICD-10-CM

## 2022-04-28 DIAGNOSIS — I10 Essential (primary) hypertension: Secondary | ICD-10-CM | POA: Diagnosis not present

## 2022-04-28 DIAGNOSIS — Z7189 Other specified counseling: Secondary | ICD-10-CM | POA: Diagnosis not present

## 2022-04-28 DIAGNOSIS — J4530 Mild persistent asthma, uncomplicated: Secondary | ICD-10-CM

## 2022-04-28 DIAGNOSIS — E8809 Other disorders of plasma-protein metabolism, not elsewhere classified: Secondary | ICD-10-CM | POA: Diagnosis not present

## 2022-04-28 DIAGNOSIS — I5043 Acute on chronic combined systolic (congestive) and diastolic (congestive) heart failure: Secondary | ICD-10-CM | POA: Diagnosis not present

## 2022-04-28 DIAGNOSIS — I4891 Unspecified atrial fibrillation: Secondary | ICD-10-CM | POA: Diagnosis not present

## 2022-04-28 DIAGNOSIS — N179 Acute kidney failure, unspecified: Secondary | ICD-10-CM | POA: Diagnosis not present

## 2022-04-28 LAB — COOXEMETRY PANEL
Carboxyhemoglobin: 1.5 % (ref 0.5–1.5)
Methemoglobin: 0.8 % (ref 0.0–1.5)
O2 Saturation: 73.9 %
Total hemoglobin: 12 g/dL (ref 12.0–16.0)

## 2022-04-28 LAB — BASIC METABOLIC PANEL
Anion gap: 7 (ref 5–15)
BUN: 45 mg/dL — ABNORMAL HIGH (ref 8–23)
CO2: 27 mmol/L (ref 22–32)
Calcium: 7.8 mg/dL — ABNORMAL LOW (ref 8.9–10.3)
Chloride: 104 mmol/L (ref 98–111)
Creatinine, Ser: 2.48 mg/dL — ABNORMAL HIGH (ref 0.61–1.24)
GFR, Estimated: 26 mL/min — ABNORMAL LOW (ref >60)
Glucose, Bld: 140 mg/dL — ABNORMAL HIGH (ref 70–99)
Potassium: 3.9 mmol/L (ref 3.5–5.1)
Sodium: 138 mmol/L (ref 135–145)

## 2022-04-28 LAB — CBC
HCT: 38.3 % — ABNORMAL LOW (ref 39.0–52.0)
Hemoglobin: 12.2 g/dL — ABNORMAL LOW (ref 13.0–17.0)
MCH: 30.2 pg (ref 26.0–34.0)
MCHC: 31.9 g/dL (ref 30.0–36.0)
MCV: 94.8 fL (ref 80.0–100.0)
Platelets: 302 10*3/uL (ref 150–400)
RBC: 4.04 MIL/uL — ABNORMAL LOW (ref 4.22–5.81)
RDW: 15.6 % — ABNORMAL HIGH (ref 11.5–15.5)
WBC: 8.8 10*3/uL (ref 4.0–10.5)
nRBC: 0 % (ref 0.0–0.2)

## 2022-04-28 LAB — GLUCOSE, CAPILLARY
Glucose-Capillary: 121 mg/dL — ABNORMAL HIGH (ref 70–99)
Glucose-Capillary: 95 mg/dL (ref 70–99)
Glucose-Capillary: 125 mg/dL — ABNORMAL HIGH (ref 70–99)
Glucose-Capillary: 136 mg/dL — ABNORMAL HIGH (ref 70–99)

## 2022-04-28 LAB — PROCALCITONIN: Procalcitonin: <0.1 ng/mL

## 2022-04-28 MED ORDER — POTASSIUM CHLORIDE CRYS ER 20 MEQ PO TBCR
40.0000 meq | EXTENDED_RELEASE_TABLET | Freq: Once | ORAL | Status: AC
  Administered 2022-04-28: 40 meq via ORAL
  Filled 2022-04-28: qty 2

## 2022-04-28 MED ORDER — METOLAZONE 2.5 MG PO TABS
2.5000 mg | ORAL_TABLET | Freq: Once | ORAL | Status: AC
  Administered 2022-04-28: 2.5 mg via ORAL
  Filled 2022-04-28: qty 1

## 2022-04-28 MED ORDER — ADULT MULTIVITAMIN W/MINERALS CH
1.0000 | ORAL_TABLET | Freq: Every day | ORAL | Status: DC
  Administered 2022-04-28 – 2022-05-08 (×11): 1 via ORAL
  Filled 2022-04-28 (×11): qty 1

## 2022-04-28 MED ORDER — METOPROLOL SUCCINATE ER 50 MG PO TB24
50.0000 mg | ORAL_TABLET | Freq: Two times a day (BID) | ORAL | Status: DC
  Administered 2022-04-28 – 2022-05-01 (×7): 50 mg via ORAL
  Filled 2022-04-28 (×7): qty 1

## 2022-04-28 MED ORDER — ENSURE ENLIVE PO LIQD
237.0000 mL | Freq: Two times a day (BID) | ORAL | Status: DC
  Administered 2022-04-28 – 2022-05-08 (×9): 237 mL via ORAL

## 2022-04-28 MED ORDER — LEVALBUTEROL HCL 0.63 MG/3ML IN NEBU
0.6300 mg | INHALATION_SOLUTION | Freq: Four times a day (QID) | RESPIRATORY_TRACT | Status: DC | PRN
  Administered 2022-04-28 – 2022-05-07 (×8): 0.63 mg via RESPIRATORY_TRACT
  Filled 2022-04-28 (×8): qty 3

## 2022-04-28 NOTE — Assessment & Plan Note (Signed)
-   Rule out nephrotic syndrome, check urine protein - Consult dietitian

## 2022-04-28 NOTE — Progress Notes (Signed)
Patient refused the use of BIPAP for the evening.  

## 2022-04-28 NOTE — Progress Notes (Signed)
Initial Nutrition Assessment  DOCUMENTATION CODES:   Obesity unspecified  INTERVENTION:  Encourage adequate PO intake Ensure Enlive po BID, each supplement provides 350 kcal and 20 grams of protein. MVI with minerals daily  NUTRITION DIAGNOSIS:   Inadequate oral intake related to acute illness as evidenced by per patient/family report.  GOAL:   Patient will meet greater than or equal to 90% of their needs  MONITOR:   PO intake, Labs, I & O's  REASON FOR ASSESSMENT:   Consult Assessment of nutrition requirement/status, Diet education (Patient with low albumin and heart failure)  ASSESSMENT:   Pt admitted from [facility] with SOB r/t acute on chronic CHF. PMH significant for combined CHF (EF 45-50%), mild AS, PAF, amio pulm tox, CAD, DM2, CKD 3 and HTN.  12/14 s/p L thoracentesis- yield 1.5L fluid  Per Cardiology, if aflutter remains persistent over weekend, plans for TEE-DCCV on Monday after fully diuresed.   PMT following for GOC.   Spoke with pt at bedside. Pt reports that he used to cook his own meals at home, primarily [place] and [place] cuisine. He rarely added extra salt to his foods and would season them with other various spices. He currently lives at [place] and does not enjoy the food there so will [employer]. He reports that for the last 3 weeks he has been primarily bed bound d/t fatigue and difficulty breathing. He has eaten very little within this time frame. Since having a thoracentesis and lasix, he reports that his is able to breathe and eat better.   This morning, he had bacon and eggs and did not particularly enjoy the eggs. Lunch he had an egg salad sandwich which he liked. Pt has previously tried Ensure/Boost supplements and is agreeable to add these to his daily regimen to help with nutritional adequacy.   Meal completions: 12/14: 20% breakfast 12/15: 90% breakfast, 100% lunch  Reviewed weight history. Within the last 11 months, it  appears his weight has fluctuated between 122-124 kg. No significant weight changes noted. Suspect his weight to be more elevated at this time d/t edema.   Medications: SSI 0-15 units TID, SSI 0-5 units qhs, klor-con BID IV drips: lasix 200mg  in D5 @ 7.77ml/hr  Labs: BUN 45, Cr 2.48   UOP: 4m x24 hours + x12 hours I/O's: - since admit  Albumin has a half-life of 21 days and is strongly affected by stress response and inflammatory process, therefore, do not expect to see an improvement in this lab value during acute hospitalization.   NUTRITION - FOCUSED PHYSICAL EXAM:  Flowsheet Row Most Recent Value  Orbital Region No depletion  Upper Arm Region No depletion  Thoracic and Lumbar Region No depletion  Buccal Region No depletion  Temple Region No depletion  Clavicle Bone Region No depletion  Clavicle and Acromion Bone Region No depletion  Scapular Bone Region No depletion  Dorsal Hand No depletion  Patellar Region No depletion  Anterior Thigh Region No depletion  Posterior Calf Region No depletion  Edema (RD Assessment) Mild  [BLE]  Hair Reviewed  Eyes Reviewed  Mouth Reviewed  Skin Reviewed  Nails Reviewed      Diet Order:   Diet Order             Diet 2 gram sodium Fluid consistency: Thin; Fluid restriction: 1800 mL Fluid  Diet effective now                   EDUCATION NEEDS:  Education needs have been addressed  Skin:  Skin Assessment: Reviewed RN Assessment  Last BM:  12/15 (type 2)  Height:   Ht Readings from Last 1 Encounters:  04/26/22 6' (1.829 m)    Weight:   Wt Readings from Last 1 Encounters:  04/28/22 122.9 kg    Ideal Body Weight:  80.9 kg  BMI:  Body mass index is 36.75 kg/m.  Estimated Nutritional Needs:   Kcal:  2000-2200  Protein:  100-115g  Fluid:  >/=2L  Drusilla Kanner, RDN, LDN Clinical Nutrition

## 2022-04-28 NOTE — Progress Notes (Signed)
Progress Note   Patient: David Huynh AVW:[id number] DOB: [birth year] DOA: 04/26/2022     1 DOS: the patient was seen and examined on 04/28/2022 at 10:15AM      Brief hospital course: David Huynh is an 81 y.o. M with sdCHF EF 45-50%, pAF on Eliquis, obesity, DM, CKD IIIb, HTN and hx amio lung fibrosis who presented with progressive SOB and edema.  Wife reported poor compliance with meds.    12/13: Admitted in respiratory distress on BiPAP, Lasix started; Cardiology consulted 12/14: Still on BiPAP; Pulmonology consulted; underwent thoracentesis 12/15: Breathing improved     Assessment and Plan: * Acute on chronic combined systolic and diastolic CHF (congestive heart failure) (HCC) Presented with leg swelling, dyspnea on exertion, CXR showing bilateral opacities and pleural effusions.  Net negative 3.5L yesterday, Cr no change, K stable - Continue diuretics per Cardiology - Continue K supplement - Strict I/Os, daily weights, telemetry, daily BMP  - Consult Cardiology, appreciate cares  - Hold Entresto, spironolactone given AKI for now - Continue metoprolol, Imdur, hydralazine     Atrial fibrillation with RVR (HCC) BP still elevated.  Amiodarone contraindicated.   - Continue apixaban - Continue metoprolol - Continue diuresis  - Consult Cardiology  Acute-on-chronic kidney injury (HCC) Chronic kidney disease stage IIIa baseline 1.6, was 2.5 mg/dL on admission, no change today   - Daily BMP - Hold Entresto and spironolactone for now  Hypoalbuminemia - Consult dietitian  Amidorone associated Pulmonary fibrosis [facility]) Admitted Apr 2022 with bilateral infiltrates and found to have amio lung toxicity, completed prolonged prednisone taper and was improving, but CT 9/22 still showed amio associated lung fibrosis.  Doubt this is primary driver at this time.  Bilateral pleural effusion 1.5L taken off left lung.  Transudate.  Breathing immediately better.     Type 2  diabetes mellitus with stage 3a chronic kidney disease, with long-term current use of insulin (HCC) GLucoses controlled - Continue SS corrections  Acute respiratory failure with hypoxia (HCC) Presented with tachynea, respiratory distress and hypoxia requiring BiPAP.  Due to fluid overload.  Procal negative, doubt pneumonia.  Doubt pulmonary fibrosis is contributing.  Improved to 3L today.  Aortic stenosis, mild - Defer to Cardiology  Cardiomyopathy, ischemic     Essential hypertension BP controlled - Continue hydralazine, Imdur, metoprolol, furosemide  Mild persistent asthma No wheezing - PRN levalbuterol  Hyperlipidemia with target LDL less than 70 - Continue Crestor  Obesity BMI >30          Subjective: Feeling better.  Still swollen but improved.  No fever, no confusion.  No respiratory distress.     Physical Exam: BP (!) 118/90 (BP Location: Left Arm)   Pulse (!) 119   Temp 97.7 F (36.5 C) (Oral)   Resp 20   Ht 6' (1.829 m)   Wt 122.9 kg   SpO2 95%   BMI 36.75 kg/m   Elderly adult male, lying in bed, appears weak and tired RRR, no murmurs, peripheral edema is markedly better, but still pitting up to the knees Respiratory rate seems normal, on nasal cannula, no wheezing appreciated Abdomen soft without tenderness palpation or guarding Attention normal, affect appropriate, judgment insight appear normal    Data Reviewed: Glucose normal Creatinine 2.48, no change from yesterday Procalcitonin negative SCV O2 negative Fluid studies reviewed, transudative Hemoglobin 12, no change, white blood cell count normal    Family Communication: Wife by phone    Disposition: Status is: Inpatient  Author: Alberteen Sam, MD 04/28/2022 2:38 PM  For on call review [email or web address].

## 2022-04-28 NOTE — Consult Note (Signed)
Consultation Note Date: 04/28/2022   Patient Name: David Huynh  DOB: Dec 31, 1940  MRN: [id number]  Age / Sex: 81 y.o., male  PCP: [name], MD Referring Physician: Edwin Dada, *  Reason for Consultation: Severe combined lung and heart disease prognosis worsening  HPI/Patient Profile: 81 y.o. 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  Initially, he required BiPAP.  Chest x-ray showed pulmonary edema. His creatinine is around 2.48.  He has albumin of 1.8.  BNP was elevated.  He has been in A-fib causing this CHF exacerbation he is on a Lasix drip.  Having some A-fib with RVR which is induced cardiomyopathy-unfortunately no good options for rhythm control.  He underwent thoracentesis yesterday and had 1500 cc off.  He reports great improvement in his breathing that was provided immediate only with the thoracentesis.  He is diuresing.  Palliative consulted for above.  Primary Decision Maker PATIENT  Discussion: I have reviewed medical records including Care Everywhere, progress notes from this and prior admissions, labs and imaging, discussed with RN.  On evaluation patient is awake alert oriented x 3 and to his situation.  David Huynh is originally from [place] and lived in [place] and eventually retired here in [place].  He currently lives at [facility].  He is married and has 2 daughters.  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  He traveled frequently for his work.  He used to enjoy cooking mostly [place] and [place].  He does not like the food at [facility].  As far as functional and nutritional status -prior to this admission he was in independent living at  [facility] with his wife.  He had significant decline over the last 4 weeks to the point where he was mostly bedbound.  He has not been eating.  His poor nutritional status is reflected in his albumin which was 1.9 on admission.  We discussed patient's current illness and what it means in the larger context of patient's on-going co-morbidities.  We discussed the trajectory of heart failure and exacerbations that can recur over time and get worse over time worsening of patient's functional status and quality of life.  I also addressed the fact that he is albumin is very low which is likely leading him to third spacing which is worsening his heart failure.  I attempted to elicit values and goals of care important to the patient.  David Huynh notes the most important thing to him is his family, being able to interact meaningfully with them.  He shares that he does not have a lot of interest in maximizing his time.  However, whenever I tried to get more in-depth and specific regarding the scope of medical interventions he would or would not want to prolong his time he declined to discuss this.    He notes that  he does have an advanced directive-his wife is his dedicated power of attorney.  If he was dying he would not want extreme measures to prolong his life.  We discussed his CODE STATUS that was currently full code.  I recommended a DO NOT RESUSCITATE order based on his values and goals of care.  He thought he had DNR in place already- he agreed to me entering a DO NOT RESUSCITATE order.  I discussed the different levels of care being DO NOT RESUSCITATE-full comfort only, DO NOT RESUSCITATE-with limited interventions- avoid ICU no intubation-and DO NOT RESUSCITATE but otherwise full scope care.  David Huynh would like to DO NOT RESUSCITATE with otherwise full scope care for now and will be amenable to further discussion about this in the future when he is not so tired.  Discussed with patient/family the  importance of continued conversation with family and the medical providers regarding overall plan of care and treatment options, ensuring decisions are within the context of the patient's values and GOCs.       SUMMARY OF RECOMMENDATIONS -DNR-full scope care -Goals of care to return home with his wife to [facility] -Hypoalbuminemia-poor nutrition-I have consulted dietitian for heart failure and hypoalbuminemia diet education he may benefit from protein supplements  Code Status/Advance Care Planning: DNR   Prognosis:   Unable to determine  Discharge Planning: Home with Palliative Services  Primary Diagnoses: Present on Admission:  Cardiomyopathy, ischemic  Acute on chronic combined systolic and diastolic CHF (congestive heart failure) (HCC)  Atrial fibrillation with RVR (HCC)  Essential hypertension  Acute-on-chronic kidney injury (HCC)  Obesity  Hyperlipidemia with target LDL less than 70  Mild persistent asthma  Acute respiratory failure with hypoxia (HCC)   Review of Systems  Constitutional:  Positive for activity change, appetite change and fatigue.  Respiratory:  Positive for shortness of breath.   Cardiovascular:  Positive for leg swelling.    Physical Exam Vitals and nursing note reviewed.  Pulmonary:     Effort: Pulmonary effort is normal.  Neurological:     General: No focal deficit present.     Mental Status: He is alert and oriented to person, place, and time.     Vital Signs: BP (!) 130/90 (BP Location: Left Arm)   Pulse (!) 120   Temp 98.3 F (36.8 C) (Oral)   Resp 17   Ht 6' (1.829 m)   Wt 122.9 kg   SpO2 97%   BMI 36.75 kg/m  Pain Scale: 0-10   Pain Score: 0-No pain   SpO2: SpO2: 97 % O2 Device:SpO2: 97 % O2 Flow Rate: .O2 Flow Rate (L/min): 3 L/min  IO: Intake/output summary:  Intake/Output Summary (Last 24 hours) at 04/28/2022 1228 Last data filed at 04/28/2022 1145 Gross per 24 hour  Intake 1022.17 ml  Output 5550 ml  Net  -4527.83 ml    LBM: Last BM Date : 04/27/22 Baseline Weight: Weight: 124.3 kg Most recent weight: Weight: 122.9 kg       Thank you for this consult. Palliative medicine will continue to follow and assist as needed.  Time Total: 100 minutes Greater than 50%  of this time was spent counseling and coordinating care related to the above assessment and plan.  Signed by: Ocie Bob, AGNP-C Palliative Medicine    Please contact Palliative Medicine Team phone at [phone] for questions and concerns.  For individual provider: See Loretha Stapler

## 2022-04-28 NOTE — Progress Notes (Signed)
PT Cancellation Note  Patient Details Name: David Huynh MRN: [id number] DOB: Nov 03, 1940   Cancelled Treatment:    Reason Eval/Treat Not Completed: Patient declined, no reason specified (Patient refused due to fatigue. He reports he has no issues with mobilizing but declined getting up. When asked if therapy could return tomorrow, he reports "only if you have to." PT will continue with attempts)  Donna Bernard, PT, MPT  Ina Homes 04/28/2022, 1:43 PM

## 2022-04-28 NOTE — Consult Note (Signed)
   [facility] CM Inpatient Consult   04/28/2022  MAXMILIAN TROSTEL 1940/08/19 [id number]  [place] Organization [ACO] Patient:  Medicare ACO REACH  Primary Care Provider:  [name], MD, [place] [facility]   Patient screened for hospitalization with noted ortho bundle banner risk noted and to assess for potential [place] Management service needs for post hospital transition for care coordination.  Review of patient's electronic medical record reveals patient is admitted with HF exacerbation.  11:55 am Met with patient at bedside briefly is currently with Palliative consulting.  Patient endorses PCP but not sure regarding the ongoing ortho team following for ongoing care coordination.  Patient resides at [facility].  Explained to patient regarding post hospital calls. SDOH reviewed in electronic medical record, no noted needs at this time.  Plan:  Continue to follow progress and disposition to assess for post hospital community care coordination/management needs.  Referral request for community care coordination: none at this time; patient with palliative recommendations  Of note, [facility] Care Management/Population Health does not replace or interfere with any arrangements made by the Inpatient Transition of Care team.  For questions contact:   Natividad Brood, RN BSN [place]  [phone] business mobile phone Toll free office [phone]  *[place]  [phone] Fax number: [phone] [email or web address]

## 2022-04-28 NOTE — Progress Notes (Signed)
NAME:  David Huynh, MRN:  [id number], DOB:  1941-02-07, LOS: 1 ADMISSION DATE:  04/26/2022, CONSULTATION DATE:  12/14 REFERRING MD:  Dr. Maryfrances Bunnell, CHIEF COMPLAINT:  SOB   History of Present Illness:  81 year old male with PMH as below, which is significant for HFrEF 40%, Moderate AS, PAF, CAD s/p PCI, amiodarone lung toxicity (improved with steroids), CKD, and DM. He presented to [facility] ED 12/13 with complaints of SOB progressive x 1 month as well as worsening lower extremity edema. He was started on BiPAP for work of breathing. He was admitted to the hospitalist service for CHF exacerbation and AF RVR. Cardiology was consulted as well and initiated diuresis with lasix infusion. Despite 2.5 liters of diuresis the patient continued to require BiPAP and bilateral pleural effusions noted on CXR. PCCM asked to evaluate.   Pertinent  Medical History   has a past medical history of Allergy, Asthma, Atrial fibrillation with rapid ventricular response (HCC) (03/25/2018), CAD, multiple vessel, Carotid bruit (08/05/2010), Chronic combined systolic and diastolic CHF (congestive heart failure) (HCC), CKD (chronic kidney disease), stage III (HCC), Diabetes mellitus, Fracture of multiple ribs (02/12/2015), Hemothorax on right (01/23/2015), Hyperlipidemia, Hypertension, Hypocalcemia (02/12/2015), Lactic acidosis (03/25/2018), Liver function study, abnormal (04/26/2011), Multiple rib fractures, Obesity, Persistent atrial fibrillation (HCC), and Pneumonia.   Significant Hospital Events: Including procedures, antibiotic start and stop dates in addition to other pertinent events   12/13 admit for CHF, AF RVR 04/27/2022 right Thora with 1500 cc obtain  Interim History / Subjective:    Objective   Blood pressure (!) 130/90, pulse (!) 120, temperature 98.3 F (36.8 C), temperature source Oral, resp. rate 17, height 6' (1.829 m), weight 122.9 kg, SpO2 97 %. CVP:  [3 mmHg-15 mmHg] 13 mmHg  FiO2 (%):  [40 %] 40  %   Intake/Output Summary (Last 24 hours) at 04/28/2022 1028 Last data filed at 04/28/2022 0911 Gross per 24 hour  Intake 1022.17 ml  Output 4950 ml  Net -3927.83 ml   Filed Weights   04/26/22 1743 04/27/22 0522 04/28/22 0500  Weight: 124.3 kg 130.9 kg 122.9 kg    Examination: General: Overly obese male no acute distress at rest HEENT: MM pink/moist no JVD Neuro: No focal defects   CV: Heart sounds are regular PULM: Diminished breath sounds throughout 5 L nasal cannula GI: soft, bsx4 active  GU: Amber urine Extremities: warm/dry, 2-3+ edema  Skin: no rashes or lesions  Resolved Hospital Problem list     Assessment & Plan:   Acute on chronic HFrEF: known LVEF 40% here with shortness of breath and lower extremity edema. Per cardiology Diuresis Afterload reduction   Acute respiratory failure with hypoxia: secondary to CHF exacerbation and compressive atelectasis in the setting of L>R bilateral pleural effusions.   BiPAP as needed O2 as needed Diuresis as tolerated 04/27/2022 postthoracentesis with 1500 cc of clear straw-colored fluid drained Serial chest x-ray  Atrial fibrillation with RVR: CAD AS, MR Per cardiology Continue Eliquis Continue Toprol Holding Lynne Logan due to AKI   - Management per cardiology   AKI on CKD 3 - per primary  Best Practice (right click and "Reselect all SmartList Selections" daily)   Per primary  Labs   CBC: Recent Labs  Lab 04/26/22 0100 04/27/22 0000 04/28/22 0540  WBC 9.9 9.5 8.8  HGB 14.5 13.4 12.2*  HCT 45.8 42.0 38.3*  MCV 92.5 93.3 94.8  PLT 340 302 302    Basic Metabolic Panel: Recent Labs  Lab  04/26/22 0100 04/27/22 0000 04/28/22 0540  NA 138 141 138  K 4.1 4.4 3.9  CL 111 110 104  CO2 19* 24 27  GLUCOSE 90 109* 140*  BUN 42* 42* 45*  CREATININE 2.48* 2.50* 2.48*  CALCIUM 8.2* 8.3* 7.8*   GFR: Estimated Creatinine Clearance: 32.2 mL/min (A) (by C-G formula based on SCr of 2.48 mg/dL  (H)). Recent Labs  Lab 04/26/22 0100 04/27/22 0000 04/27/22 0817 04/28/22 0540  PROCALCITON  --   --  <0.10 <0.10  WBC 9.9 9.5  --  8.8    Liver Function Tests: Recent Labs  Lab 04/26/22 0306 04/27/22 1448  AST 16  --   ALT 10  --   ALKPHOS 63  --   BILITOT 0.7  --   PROT 5.7* 4.5*  ALBUMIN 1.8*  --    No results for input(s): "LIPASE", "AMYLASE" in the last 168 hours. No results for input(s): "AMMONIA" in the last 168 hours.  ABG    Component Value Date/Time   PHART 7.434 01/25/2015 0350   PCO2ART 40.9 01/25/2015 0350   PO2ART 81.0 01/25/2015 0350   HCO3 28.7 (H) 03/02/2020 1034   HCO3 29.4 (H) 03/02/2020 1034   TCO2 24 10/26/2020 0730   ACIDBASEDEF 1.0 01/24/2015 0826   O2SAT 73.9 04/28/2022 0509     Coagulation Profile: No results for input(s): "INR", "PROTIME" in the last 168 hours.  Cardiac Enzymes: No results for input(s): "CKTOTAL", "CKMB", "CKMBINDEX", "TROPONINI" in the last 168 hours.  HbA1C: Hemoglobin A1C  Date/Time Value Ref Range Status  09/22/2021 01:32 PM 7.7 (A) 4.0 - 5.6 % Final  03/15/2021 12:58 PM 8.6 (A) 4.0 - 5.6 % Final   Hgb A1c MFr Bld  Date/Time Value Ref Range Status  04/26/2022 02:24 PM 5.8 (H) 4.8 - 5.6 % Final    Comment:    (NOTE)         Prediabetes: 5.7 - 6.4         Diabetes: >6.4         Glycemic control for adults with diabetes: <7.0   11/08/2020 11:40 AM 10.0 (H) 4.6 - 6.5 % Final    Comment:    Glycemic Control Guidelines for People with Diabetes:Non Diabetic:  <6%Goal of Therapy: <7%Additional Action Suggested:  >8%     CBG: Recent Labs  Lab 04/27/22 0738 04/27/22 1144 04/27/22 1619 04/27/22 2020 04/28/22 0628  GLUCAP 88 90  90 100* 150* 121*      Critical care time:      Brett Canales Elizardo Chilson ACNP Acute Care Nurse Practitioner [facility] Pulmonary/Critical Care Please consult Amion 04/28/2022, 10:29 AM

## 2022-04-28 NOTE — Progress Notes (Signed)
Patient ID: David Huynh, male   DOB: 1941/04/05, 81 y.o.   MRN: [id number]     Advanced Heart Failure Rounding Note  PCP-Cardiologist: [name], MD   Subjective:    Good diuresis yesterday, weight down.  CVP 10-11 this morning with co-ox 74%.  Still significant edema in legs.   In atrial flutter rate 120s today.  He had periods of NSR yesterday in 80s.   Creatinine stable at 2.48. PCT < 0.1.   1.5 L left thoracentesis on 12/14, transudate.   Echo with EF 40-45%, RV mildly reduced. Mod AS by AVA and DI. AVA 1.50 cm2, DI 0.34. MG 17 mmHG. Mod left pleural effusion.   Subjectively breathing better.   Objective:   Weight Range: 122.9 kg Body mass index is 36.75 kg/m.   Vital Signs:   Temp:  [97.6 F (36.4 C)-98.3 F (36.8 C)] 97.7 F (36.5 C) (12/15 0332) Pulse Rate:  [83-87] 87 (12/15 0332) Resp:  [18-29] 18 (12/15 0332) BP: (111-135)/(75-91) 129/91 (12/15 0500) SpO2:  [97 %-100 %] 97 % (12/15 0632) FiO2 (%):  [40 %] 40 % (12/14 1300) Weight:  [122.9 kg] 122.9 kg (12/15 0500) Last BM Date : 04/27/22  Weight change: Filed Weights   04/26/22 1743 04/27/22 0522 04/28/22 0500  Weight: 124.3 kg 130.9 kg 122.9 kg    Intake/Output:   Intake/Output Summary (Last 24 hours) at 04/28/2022 0744 Last data filed at 04/28/2022 4098 Gross per 24 hour  Intake 1059.84 ml  Output 4550 ml  Net -3490.16 ml      Physical Exam    CVP 10-11  General: NAD Neck: JVP 10-12 cm, no thyromegaly or thyroid nodule.  Lungs: Clear to auscultation bilaterally with normal respiratory effort. CV: Nondisplaced PMI.  Heart tachy, regular S1/S2, no S3/S4, 2/6 SEM RUSB.  2+ edema to knees.  Abdomen: Soft, nontender, no hepatosplenomegaly, no distention.  Skin: Intact without lesions or rashes.  Neurologic: Alert and oriented x 3.  Psych: Normal affect. Extremities: No clubbing or cyanosis.  HEENT: Normal.    Telemetry   Atrial flutter 120s, occasional PVCs. Periods of NSR  yesterday in 80s.  Personally reviewed.   EKG    N/A   Labs    CBC Recent Labs    04/27/22 0000 04/28/22 0540  WBC 9.5 8.8  HGB 13.4 12.2*  HCT 42.0 38.3*  MCV 93.3 94.8  PLT 302 302   Basic Metabolic Panel Recent Labs    11/91/47 0000 04/28/22 0540  NA 141 138  K 4.4 3.9  CL 110 104  CO2 24 27  GLUCOSE 109* 140*  BUN 42* 45*  CREATININE 2.50* 2.48*  CALCIUM 8.3* 7.8*   Liver Function Tests Recent Labs    04/26/22 0306 04/27/22 1448  AST 16  --   ALT 10  --   ALKPHOS 63  --   BILITOT 0.7  --   PROT 5.7* 4.5*  ALBUMIN 1.8*  --    No results for input(s): "LIPASE", "AMYLASE" in the last 72 hours. Cardiac Enzymes No results for input(s): "CKTOTAL", "CKMB", "CKMBINDEX", "TROPONINI" in the last 72 hours.  BNP: BNP (last 3 results) Recent Labs    04/26/22 0100  BNP 1,368.4*    ProBNP (last 3 results) No results for input(s): "PROBNP" in the last 8760 hours.   D-Dimer No results for input(s): "DDIMER" in the last 72 hours. Hemoglobin A1C Recent Labs    04/26/22 1424  HGBA1C 5.8*   Fasting Lipid Panel No  results for input(s): "CHOL", "HDL", "LDLCALC", "TRIG", "CHOLHDL", "LDLDIRECT" in the last 72 hours. Thyroid Function Tests No results for input(s): "TSH", "T4TOTAL", "T3FREE", "THYROIDAB" in the last 72 hours.  Invalid input(s): "FREET3"  Other results:   Imaging    DG CHEST PORT 1 VIEW  Result Date: 04/27/2022 CLINICAL DATA:  Post right thoracentesis EXAM: PORTABLE CHEST 1 VIEW COMPARISON:  04/26/2022 FINDINGS: Stable cardiomediastinal contours. Pulmonary vascular congestion with interstitial and alveolar opacities bilaterally, worsened within the left lung. Decreased right-sided pleural effusion status post thoracentesis with improved aeration in the right lung base. No pneumothorax. Stable right-sided PICC line. IMPRESSION: 1. Decreased right-sided pleural effusion status post thoracentesis. No pneumothorax. 2. Pulmonary vascular  congestion with interstitial and alveolar opacities bilaterally, worsened within the left lung. Electronically Signed   By: Davina Poke D.O.   On: 04/27/2022 14:52     Medications:     Scheduled Medications:  apixaban  2.5 mg Oral BID   Chlorhexidine Gluconate Cloth  6 each Topical Daily   hydrALAZINE  12.5 mg Oral Q8H   insulin aspart  0-15 Units Subcutaneous TID WC   insulin aspart  0-5 Units Subcutaneous QHS   isosorbide mononitrate  15 mg Oral Daily   metolazone  2.5 mg Oral Once   metoprolol succinate  50 mg Oral BID   potassium chloride  20 mEq Oral BID   potassium chloride  40 mEq Oral Once   rosuvastatin  40 mg Oral Daily   sodium chloride flush  10-40 mL Intracatheter Q12H   sodium chloride flush  3 mL Intravenous Q12H    Infusions:  sodium chloride     furosemide (LASIX) 200 mg in dextrose 5 % 100 mL (2 mg/mL) infusion 15 mg/hr (04/28/22 0545)    PRN Medications: sodium chloride, acetaminophen, hydrALAZINE, labetalol, ondansetron (ZOFRAN) IV, sodium chloride flush, sodium chloride flush    Patient Profile   81 y.o. male with a history of CAD, chronic systolic CHF, HTN, HLD, DM, CKD 3, asthma, and PAF w/ h/o amiodarone lung toxicity, admitted w/ a/c CHF and recurrent AF w/ RVR.   Assessment/Plan   1. Acute on chronic CHF: Prior ischemic cardiomyopathy, improved after PCI in 2017.  Echo 03/2018 with EF back down to 35-40% and moderate-severe RV dysfunction. This was in the setting of atrial fibrillation with RVR. EF 40% on TEE 04/2018. Echo in 3/20 showed EF up to 50%.  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  Echo in 7/21 with EF 25-30%, severe RV dysfunction.  TEE in 10/21, however, showed EF up to 50%.  He had PCI to LAD in 10/21. Echo in 3/22 showed stable EF 50-55% with normal RV.   Echo in 8/22 with EF 45-50%, moderate LVH, normal RV, moderate AS.  He returns with marked volume overload in setting of recurrent AF with RVR.  No  evidence for ACS, suspect exacerbation may be due to AF/RVR. He has been lost to follow-up. Echo this admit EF 40-45%, RV mildly reduced, moderate AS. PICC placed. Initial Co-ox 73%>> 74% today. CVP 10-11 but with marked peripheral edema and ongoing oxygen requirement. Excellent UOP with lasix gtt at 15/hr and metolazone 2.5 x 1. SCr 2.5 (stable since admission) - Continue Lasix gtt at 15/hr + give 2.5 mg of metolazone today.  - Apply unna boots  - Hydralazine/Imdur for afterload reduction with elevated creatinine and high BP.   2. Atrial fibrillation/flutter: As above, concerned for possible tachy-mediated CMP.  Had successful DC-CV on 03/29/18 but then back in atrial fibrillation. Ranolazine added, but failed DCCV again 04/02/18. DCCV 04/19/18 was successful.  AF with RVR at 3/22 admission, amiodarone restarted and he was cardioverted to NSR, but he developed amiodarone lung toxicity.  Amiodarone was stopped. Now s/p atrial fibrillation ablation in 6/22.  He is now back in atypical atrial flutter with RVR, not sure how long. Suspect this triggered recurrent CHF.  He does not appear to still be on ranolazine at home. He had short periods of NSR yesterday with rate in 80s but generally in atrial flutter rate 120s.  - No amiodarone with history of lung toxicity, anti-arrhythmic options limited as he also has AKI on CKD stage 3.    - Continue Eliquis 2.5 mg bid (of note, has missed doses recently).  - Increase Toprol XL to 50 mg bid to try to rate control (on Coreg at home).  - EP has seen this admit. No good options for rhythm control acutely. He should follow-up with EP when stable to consider ablation candidacy.  - If atrial flutter remains persistent over weekend, TEE-DCCV on Monday once he is fully diuresed.  3. AKI on CKD stage 3: Creatinine baseline in past (1 year ago) was around 1.6.   Creatinine today is 2.48.  ?Cardiorenal.  - Hold Entresto, spironolactone for now (not totally clear that he was  taking at home).  - Diurese as above, hopefully decrease in renal venous pressure will help renal function.  4. CAD: History of DES to RCA in 9/17.  Underwent LHC 04/03/18 with PCI to prox left circ. LHC in 10/21 with PCI to LAD.  No chest pain this admission.  Mild elevation in TnI but no trend, doubt ACS.  - He is on Eliquis, so no ASA.   - Continue statin.  - Defer cath with elevated creatinine and no ACS.  5. Aortic stenosis: Moderate on 8/22 echo. Moderate on echo this admit, AVA 1.50 cm2, DI 0.34, mean gradient 17 mmHG.  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  He improved with steroids in the past.  7. Pleural effusion: S/p left pleural thoracentesis, 1.5 L (transudate).  May need right done eventually.   Loralie Champagne 04/28/2022 7:44 AM

## 2022-04-28 NOTE — TOC Initial Note (Signed)
Transition of Care [facility]) - Initial/Assessment Note    Patient Details  Name: David Huynh MRN: [id number] Date of Birth: [birth year]  Transition of Care [facility]) CM/SW Contact:    Elliot Cousin, RN Phone Number: [phone] 04/28/2022, 4:04 PM  Clinical Narrative:                 HF TOC CM spoke to pt and gave permission to speak to wife. Contacted wife and states [place] at [facility] [place]. States she is agreeable to SNF rehab at [facility]. Waiting PT recommendations. States he had PT and OT in the past. Will continue to follow for dc needs.   Expected Discharge Plan: Skilled Nursing Facility Barriers to Discharge: Continued Medical Work up   Patient Goals and CMS Choice   CMS Medicare.gov Compare Post Acute Care list provided to:: Patient Represenative (must comment) (wife) Choice offered to / list presented to : Spouse  Expected Discharge Plan and Services Expected Discharge Plan: Skilled Nursing Facility   Discharge Planning Services: CM Consult Post Acute Care Choice: Skilled Nursing Facility Living arrangements for the past 2 months: Independent Living Facility                                      Prior Living Arrangements/Services Living arrangements for the past 2 months: Independent Living Facility Lives with:: Spouse Patient language and need for interpreter reviewed:: Yes Do you feel safe going back to the place where you live?: Yes      Need for Family Participation in Patient Care: Yes (Comment) Care giver support system in place?: Yes (comment) Current home services: DME (Rollator, bedside commode) Criminal Activity/Legal Involvement Pertinent to Current Situation/Hospitalization: No - Comment as needed  Activities of Daily Living      Permission Sought/Granted Permission sought to share information with : Case Manager, PCP Permission granted to share information with : Yes, Verbal Permission Granted  Share Information with NAME: Terrin Meddaugh     Permission granted to share info w Relationship: wife  Permission granted to share info w Contact Information: [phone]  Emotional Assessment Appearance:: Appears stated age Attitude/Demeanor/Rapport: Engaged Affect (typically observed): Accepting Orientation: : Oriented to Self, Oriented to Place   Psych Involvement: No (comment)  Admission diagnosis:  Hypoxia [R09.02] AKI (acute kidney injury) (HCC) [N17.9] Atrial fibrillation with RVR (HCC) [I48.91] Acute on chronic congestive heart failure, unspecified heart failure type (HCC) [I50.9] Acute on chronic systolic CHF (congestive heart failure) (HCC) [I50.23] Patient Active Problem List   Diagnosis Date Noted   Hypoalbuminemia 04/28/2022   Bilateral pleural effusion 04/27/2022   Amidorone associated Pulmonary fibrosis (HCC) 04/27/2022   Atrial fibrillation with RVR (HCC) 04/26/2022   Type 2 diabetes mellitus with stage 3a chronic kidney disease, with long-term current use of insulin (HCC) 09/22/2021   Secondary hypercoagulable state (HCC) 11/23/2020   Hyperkalemia 09/05/2020   Diastolic CHF (HCC) 08/18/2020   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   Syncope and collapse 08/09/2020   Acute-on-chronic kidney injury (HCC) 08/09/2020   Type 2 diabetes mellitus with diabetic polyneuropathy, without long-term current use of insulin (HCC) 02/24/2020   Acute on chronic combined systolic (congestive) and diastolic (congestive) heart failure (HCC) 12/06/2019   Acute respiratory failure with hypoxia (HCC) 12/06/2019   Persistent atrial fibrillation (HCC) 03/25/2018   CAD in native artery    Aortic stenosis, mild 06/28/2016   Cardiomyopathy, ischemic 02/17/2016  Acute on chronic combined systolic and diastolic CHF (congestive heart failure) (HCC) 02/17/2016   Dyspnea    Hypertensive heart disease    Hypocalcemia 02/12/2015   Fracture of multiple ribs 02/12/2015   Other specified forms of effusion, except tuberculous     Essential hypertension    Other and unspecified hyperlipidemia    Liver function study, abnormal 04/26/2011   Obesity    Hyperlipidemia with target LDL less than 70    Mild persistent asthma    Allergic state    PCP:  Bradd Canary, MD Pharmacy:   [facility] [address] [place] [address] Phone: [phone] Fax: [phone]  [id number] [place], [place] - [address] AT [facility] [address] [place] [address] Phone: [phone] Fax: [phone]     Social Determinants of Health (SDOH) Interventions    Readmission Risk Interventions    08/20/2020   11:37 AM  Readmission Risk Prevention Plan  Transportation Screening Complete  PCP or Specialist Appt within 3-5 Days Complete  HRI or Home Care Consult Complete  Social Work Consult for Recovery Care Planning/Counseling Complete  Palliative Care Screening Not Applicable  Medication Review Oceanographer) Complete

## 2022-04-29 ENCOUNTER — Inpatient Hospital Stay (HOSPITAL_COMMUNITY): Payer: Medicare Other

## 2022-04-29 DIAGNOSIS — N179 Acute kidney failure, unspecified: Secondary | ICD-10-CM | POA: Diagnosis not present

## 2022-04-29 DIAGNOSIS — J9601 Acute respiratory failure with hypoxia: Secondary | ICD-10-CM | POA: Diagnosis not present

## 2022-04-29 DIAGNOSIS — I5043 Acute on chronic combined systolic (congestive) and diastolic (congestive) heart failure: Secondary | ICD-10-CM | POA: Diagnosis not present

## 2022-04-29 DIAGNOSIS — I4891 Unspecified atrial fibrillation: Secondary | ICD-10-CM | POA: Diagnosis not present

## 2022-04-29 LAB — COOXEMETRY PANEL
Carboxyhemoglobin: 1.8 % — ABNORMAL HIGH (ref 0.5–1.5)
Methemoglobin: 0.9 % (ref 0.0–1.5)
O2 Saturation: 85.9 %
Total hemoglobin: 10.8 g/dL — ABNORMAL LOW (ref 12.0–16.0)

## 2022-04-29 LAB — CBC
HCT: 39.7 % (ref 39.0–52.0)
Hemoglobin: 12.6 g/dL — ABNORMAL LOW (ref 13.0–17.0)
MCH: 30.1 pg (ref 26.0–34.0)
MCHC: 31.7 g/dL (ref 30.0–36.0)
MCV: 94.7 fL (ref 80.0–100.0)
Platelets: 302 10*3/uL (ref 150–400)
RBC: 4.19 MIL/uL — ABNORMAL LOW (ref 4.22–5.81)
RDW: 15.4 % (ref 11.5–15.5)
WBC: 9.6 10*3/uL (ref 4.0–10.5)
nRBC: 0 % (ref 0.0–0.2)

## 2022-04-29 LAB — GLUCOSE, CAPILLARY
Glucose-Capillary: 104 mg/dL — ABNORMAL HIGH (ref 70–99)
Glucose-Capillary: 186 mg/dL — ABNORMAL HIGH (ref 70–99)
Glucose-Capillary: 140 mg/dL — ABNORMAL HIGH (ref 70–99)
Glucose-Capillary: 227 mg/dL — ABNORMAL HIGH (ref 70–99)

## 2022-04-29 LAB — URINALYSIS, ROUTINE W REFLEX MICROSCOPIC
Bilirubin Urine: NEGATIVE
Glucose, UA: NEGATIVE mg/dL
Ketones, ur: NEGATIVE mg/dL
Nitrite: NEGATIVE
Protein, ur: >=300 mg/dL — AB
Specific Gravity, Urine: 1.008 (ref 1.005–1.030)
WBC, UA: >50 WBC/hpf — ABNORMAL HIGH (ref 0–5)
pH: 5 (ref 5.0–8.0)

## 2022-04-29 LAB — BASIC METABOLIC PANEL
Anion gap: 10 (ref 5–15)
BUN: 43 mg/dL — ABNORMAL HIGH (ref 8–23)
CO2: 29 mmol/L (ref 22–32)
Calcium: 8.1 mg/dL — ABNORMAL LOW (ref 8.9–10.3)
Chloride: 100 mmol/L (ref 98–111)
Creatinine, Ser: 2.52 mg/dL — ABNORMAL HIGH (ref 0.61–1.24)
GFR, Estimated: 25 mL/min — ABNORMAL LOW (ref >60)
Glucose, Bld: 115 mg/dL — ABNORMAL HIGH (ref 70–99)
Potassium: 3.9 mmol/L (ref 3.5–5.1)
Sodium: 139 mmol/L (ref 135–145)

## 2022-04-29 LAB — PROTEIN / CREATININE RATIO, URINE
Creatinine, Urine: 39 mg/dL
Protein Creatinine Ratio: 7.67 mg/mg{Cre} — ABNORMAL HIGH (ref 0.00–0.15)
Total Protein, Urine: 299 mg/dL

## 2022-04-29 LAB — MAGNESIUM: Magnesium: 2 mg/dL (ref 1.7–2.4)

## 2022-04-29 NOTE — TOC Progression Note (Signed)
Transition of Care [facility]) - Progression Note    Patient Details  Name: [name] MRN: [id number] Date of Birth: Sep 27, 1940  Transition of Care [facility]) CM/SW Contact  Delilah Shan, LCSWA Phone Number: 04/29/2022, 4:20 PM  Clinical Narrative:     CSW spoke with patient at bedside regarding PT/OT recommendations. Patient reports he comes from [facility] independent living. Patient reports he understands PT/OT recommendations but declined SNF placement at time of dc. Patient would like to return home when medically ready for dc. Patient gave CSW permission to speak with his spouse. Patients spouse would like for patient to receive some home PT if possible but would like for patient to be in agreement with that as well. CSW informed CM. TOC will continue to follow and assist with patients dc planning needs.   Expected Discharge Plan: Skilled Nursing Facility Barriers to Discharge: Continued Medical Work up  Expected Discharge Plan and Services Expected Discharge Plan: Skilled Nursing Facility   Discharge Planning Services: CM Consult Post Acute Care Choice: Skilled Nursing Facility Living arrangements for the past 2 months: Independent Living Facility                                       Social Determinants of Health (SDOH) Interventions    Readmission Risk Interventions    08/20/2020   11:37 AM  Readmission Risk Prevention Plan  Transportation Screening Complete  PCP or Specialist Appt within 3-5 Days Complete  HRI or Home Care Consult Complete  Social Work Consult for Recovery Care Planning/Counseling Complete  Palliative Care Screening Not Applicable  Medication Review Oceanographer) Complete

## 2022-04-29 NOTE — Progress Notes (Signed)
Pt educated on daily weights, s/s of weight gain, low na diet, fluid restriction, importance of exercise. Pt is not interested in CRPII.  David Huynh 04/29/2022 10:48 AM   [phone]

## 2022-04-29 NOTE — Plan of Care (Signed)
  Problem: Nutrition: Goal: Adequate nutrition will be maintained Outcome: Completed/Met   Problem: Pain Managment: Goal: General experience of comfort will improve Outcome: Completed/Met

## 2022-04-29 NOTE — Evaluation (Signed)
Physical Therapy Evaluation Patient Details Name: David Huynh MRN: [id number] DOB: [birth year] Today's Date: 04/29/2022  History of Present Illness  Patient is a 81 y/o male who presents on 12/13 with SOB for 1 month and edema in BLEs. Found to have A-fib with RVR, AKI on CKD and acute on chronic CHF exacerbation. s/p thoracentesis 12/14. PMH includes COVID 19, A-fib, CAD, CHF, CKD III, DM, HTN, obesity, chronic lung inflammation.  Clinical Impression  Patient presents with generalized weakness, deconditioning, SOB, decreased activity tolerance and impaired mobility s/p above. Pt is from [facility] facility and reports being mostly bedbound the last few weeks due to SOB, fatigue and edema. Prior to this, pt was using rollator PRN. Today, pt requires Min A for standing and close Min guard assist with use of RW for ambulation. Limited by fatigue/weakness. Pt's wife recently had shoulder surgery so unsure the amount of assist she can provide at home. Sp02 remained in 90s on 3L/min 02 Axis with activity with 2/4 DOE. Would benefit from SNF to maximize independence and mobility prior to return home. Will follow acutely.       Recommendations for follow up therapy are one component of a multi-disciplinary discharge planning process, led by the attending physician.  Recommendations may be updated based on patient status, additional functional criteria and insurance authorization.  Follow Up Recommendations Skilled nursing-short term rehab (<3 hours/day) Can patient physically be transported by private vehicle: Yes    Assistance Recommended at Discharge Frequent or constant Supervision/Assistance  Patient can return home with the following  A little help with walking and/or transfers;A little help with bathing/dressing/bathroom;Assistance with cooking/housework;Assist for transportation    Equipment Recommendations None recommended by PT  Recommendations for Other Services        Functional Status Assessment Patient has had a recent decline in their functional status and demonstrates the ability to make significant improvements in function in a reasonable and predictable amount of time.     Precautions / Restrictions Precautions Precautions: Fall;Other (comment) Precaution Comments: watch 02 Restrictions Weight Bearing Restrictions: No      Mobility  Bed Mobility Overal bed mobility: Needs Assistance Bed Mobility: Supine to Sit     Supine to sit: HOB elevated, Supervision     General bed mobility comments: Use of rails and increased time.    Transfers Overall transfer level: Needs assistance Equipment used: Rolling walker (2 wheels) Transfers: Sit to/from Stand Sit to Stand: Min assist           General transfer comment: min A to power to standing with cues for hand placement/technique as pt wanting to pull up on RW. Transferred to chair post ambulation.    Ambulation/Gait Ambulation/Gait assistance: Min guard Gait Distance (Feet): 15 Feet Assistive device: Rolling walker (2 wheels) Gait Pattern/deviations: Step-through pattern, Decreased stride length, Trunk flexed Gait velocity: decreased Gait velocity interpretation: <1.31 ft/sec, indicative of household ambulator   General Gait Details: Slow, mildly unsteady gait with use of RW for support. 2/4 DOE. Feels weak in BLEs. Limited distance due to fatigue/weakness.  Stairs            Wheelchair Mobility    Modified Rankin (Stroke Patients Only)       Balance Overall balance assessment: Needs assistance Sitting-balance support: Feet supported, No upper extremity supported Sitting balance-Leahy Scale: Good     Standing balance support: During functional activity, Reliant on assistive device for balance, Bilateral upper extremity supported Standing balance-Leahy Scale: Poor Standing balance comment:  Relies on UE for support and close min guard                              Pertinent Vitals/Pain Pain Assessment Pain Assessment: No/denies pain    Home Living Family/patient expects to be discharged to:: Other (Comment) ([facility])                        Prior Function Prior Level of Function : Independent/Modified Independent             Mobility Comments: using rollator for ambulation PRN however reports he has been msotly bedbound the last few weeks due to feeling tired, fatigue and increased swelling in his entire body ADLs Comments: independent     Hand Dominance   Dominant Hand: Right    Extremity/Trunk Assessment   Upper Extremity Assessment Upper Extremity Assessment: Defer to OT evaluation    Lower Extremity Assessment Lower Extremity Assessment: Generalized weakness    Cervical / Trunk Assessment Cervical / Trunk Assessment: Kyphotic  Communication   Communication: No difficulties  Cognition Arousal/Alertness: Awake/alert Behavior During Therapy: WFL for tasks assessed/performed Overall Cognitive Status: No family/caregiver present to determine baseline cognitive functioning                                 General Comments: Appears WFL however pt reports he was mostly bedbound for 3 weeks due to worsening symptoms of fatigue, SOB and swelling in his entire body and did not feel the need to come to the hospital.        General Comments General comments (skin integrity, edema, etc.): VSS on 3L 02.    Exercises     Assessment/Plan    PT Assessment Patient needs continued PT services  PT Problem List Decreased strength;Decreased mobility;Decreased safety awareness;Decreased activity tolerance;Decreased balance;Cardiopulmonary status limiting activity;Decreased cognition;Decreased knowledge of use of DME       PT Treatment Interventions Therapeutic exercise;Gait training;Patient/family education;Therapeutic activities;Functional mobility training;Balance training;DME instruction    PT  Goals (Current goals can be found in the Care Plan section)  Acute Rehab PT Goals Patient Stated Goal: to go home PT Goal Formulation: With patient Time For Goal Achievement: 05/13/22 Potential to Achieve Goals: Good    Frequency Min 3X/week     Co-evaluation               AM-PAC PT "6 Clicks" Mobility  Outcome Measure Help needed turning from your back to your side while in a flat bed without using bedrails?: A Little Help needed moving from lying on your back to sitting on the side of a flat bed without using bedrails?: A Little Help needed moving to and from a bed to a chair (including a wheelchair)?: A Little Help needed standing up from a chair using your arms (e.g., wheelchair or bedside chair)?: A Little Help needed to walk in hospital room?: Total Help needed climbing 3-5 steps with a railing? : A Lot 6 Click Score: 15    End of Session Equipment Utilized During Treatment: Gait belt;Oxygen Activity Tolerance: Patient limited by fatigue Patient left: in chair;with call bell/phone within reach Nurse Communication: Mobility status;Other (comment) (tech notified that primofit was not working properly) PT Visit Diagnosis: Muscle weakness (generalized) (M62.81);Difficulty in walking, not elsewhere classified (R26.2);Unsteadiness on feet (R26.81)    Time: [id number] PT Time Calculation (  min) (ACUTE ONLY): 31 min   Charges:   PT Evaluation $PT Eval Moderate Complexity: 1 Mod PT Treatments $Therapeutic Activity: 8-22 mins        Marisa Severin, PT, DPT Acute Rehabilitation Services Secure chat preferred Office [place] 04/29/2022, 2:22 PM

## 2022-04-29 NOTE — Progress Notes (Signed)
04/29/2022  I have seen and evaluated the patient for hypoxemia.  S:  No events, breathing improved.  Some dyspnea with exertion too.  O: Blood pressure (!) 85/64, pulse 79, temperature (!) 97.4 F (36.3 C), temperature source Oral, resp. rate 16, height 6' (1.829 m), weight 119.4 kg, SpO2 100 %.  No distress Diminished breath sounds bases Large R, moderate L effusion on [place] AOx3  A:  Decompensated heart failure Extensive third spacing seems out of proportion Low albumin  P:  Drain pleural spaces Consider testing for concurrent nephrotic syndrome Diuresis and inotropes per CHF team PCCM available PRN   Myrla Halsted MD [place] [facility] Prefer epic messenger for cross cover needs If after hours, please call E-link

## 2022-04-29 NOTE — Procedures (Signed)
Thoracentesis  Procedure Note  David Huynh  [id number]  1940/09/19  Date:04/29/22  Time:3:23 PM   Provider Performing:Kabir Brannock C Katrinka Blazing   Procedure: Thoracentesis with imaging guidance ([id number])  Indication(s) Pleural Effusion  Consent Risks of the procedure as well as the alternatives and risks of each were explained to the patient and/or caregiver.  Consent for the procedure was obtained and is signed in the bedside chart  Anesthesia Topical only with 1% lidocaine    Time Out Verified patient identification, verified procedure, site/side was marked, verified correct patient position, special equipment/implants available, medications/allergies/relevant history reviewed, required imaging and test results available.   Sterile Technique Maximal sterile technique including full sterile barrier drape, hand hygiene, sterile gown, sterile gloves, mask, hair covering, sterile ultrasound probe cover (if used).  Procedure Description Ultrasound was used to identify appropriate pleural anatomy for placement and overlying skin marked.  Area of drainage cleaned and draped in sterile fashion. Lidocaine was used to anesthetize the skin and subcutaneous tissue.  1750 cc's of straw appearing fluid was drained from the right pleural space. Catheter then removed and bandaid applied to site.   Complications/Tolerance None; patient tolerated the procedure well. Chest X-ray is ordered to confirm no post-procedural complication.   EBL Minimal   Specimen(s) None

## 2022-04-29 NOTE — Progress Notes (Signed)
Progress Note   Patient: David Huynh QQI:[id number] DOB: 02-17-1941 DOA: 04/26/2022     2 DOS: the patient was seen and examined on 04/29/2022 at 10:41AM      Brief hospital course: David Huynh is an 81 y.o. M with sdCHF EF 45-50%, pAF on Eliquis, obesity, DM, CKD IIIb, HTN and hx amio lung fibrosis who presented with progressive SOB and edema.  Wife reported poor compliance with meds.    12/13: Admitted in respiratory distress on BiPAP, Lasix started; Cardiology consulted 12/14: Still on BiPAP; Pulmonology consulted; underwent thoracentesis 12/15: Breathing improved 12/16: Repeat thoracentesis     Assessment and Plan: * Acute on chronic combined systolic and diastolic CHF (congestive heart failure) (HCC) Presented with leg swelling, dyspnea on exertion, CXR showing bilateral opacities and pleural effusions.  Weight down 3lbs.  Net negative 2.9L yesterday, Cr no change, K stable - Continue diuretics per Cardiology - Repeat thoracentesis - Continue K supplement - Strict I/Os, daily weights, telemetry, daily BMP  - Consult Cardiology, appreciate cares  - Hold Entresto, spironolactone given AKI for now - Continue metoprolol, Imdur, hydralazine    Atrial fibrillation with RVR (HCC) HR somewhat improved, 80s-90s Amiodarone contraindicated.   - Continue apixaban - Continue metoprolol - Continue diuresis  - Consult Cardiology  Acute-on-chronic kidney injury (HCC) Chronic kidney disease stage IIIa baseline 1.6, was 2.5 mg/dL on admission, no improvement with decongestion.  Agree with Pulmonology, ?nephrotic syndrome - Check UA and P/C ratio, if elevated will consult Nephrology - Daily BMP - Hold Entresto and spironolactone for now  Hypoalbuminemia - Rule out nephrotic syndrome, check urine protein - Consult dietitian  Amidorone associated Pulmonary fibrosis [facility]) Admitted Apr 2022 with bilateral infiltrates and found to have amio lung toxicity, completed  prolonged prednisone taper and was improving, but CT 9/22 still showed amio associated lung fibrosis.  Doubt this is primary driver at this time.  Bilateral pleural effusion 1.5L taken off left lung on 12/14.  Transudate.  Breathing immediately better after.  CXR 12/16 showed large effusions again, repeat bilateral thoracentesis of 2.7L more fluid.  Type 2 diabetes mellitus with stage 3a chronic kidney disease, with long-term current use of insulin (HCC) Glucoses controlled - Continue SS corrections  Acute respiratory failure with hypoxia (HCC) Presented with tachynea, respiratory distress and hypoxia requiring BiPAP.  Due to fluid overload.  Procal negative, doubt pneumonia.  Doubt pulmonary fibrosis is contributing.  Improved to 3L today.  Aortic stenosis, mild - Defer to Cardiology  Cardiomyopathy, ischemic      Essential hypertension BP controlled - Continue hydralazine, Imdur, metoprolol, furosemide  Mild persistent asthma No wheezing - PRN levalbuterol  Hyperlipidemia with target LDL less than 70 - Continue Crestor  Obesity BMI >30          Subjective: Feels fine, would like to walk.  No fever, confusion.     Physical Exam: BP (!) 85/64 Comment: post procedure  Pulse 79   Temp (!) 97.4 F (36.3 C) (Oral)   Resp 16   Ht 6' (1.829 m)   Wt 119.4 kg   SpO2 100%   BMI 35.70 kg/m   Elderly adult male, lying in bed, no acute distress RRR, no murmurs, marked peripheral edema Respiratory rate normal, lungs clear without rales or wheezes Attention normal, affect appropriate, judgment insight appear normal Severe generalized weakness   Data Reviewed: Discussed with pulmonology Creatinine stable at 2.5, BUN 43, no change SCV O2 80s Magnesium 2 CBC unremarkable  Family Communication: None  present    Disposition: Status is: Inpatient         Author: Alberteen Sam, MD 04/29/2022 3:42 PM  For on call review [email or web address].

## 2022-04-29 NOTE — Procedures (Signed)
Thoracentesis  Procedure Note  David Huynh  [id number]  1940-05-31  Date:04/29/22  Time:3:24 PM   Provider Performing:Jelicia Nantz C Katrinka Blazing   Procedure: Thoracentesis with imaging guidance ([id number])  Indication(s) Pleural Effusion  Consent Risks of the procedure as well as the alternatives and risks of each were explained to the patient and/or caregiver.  Consent for the procedure was obtained and is signed in the bedside chart  Anesthesia Topical only with 1% lidocaine    Time Out Verified patient identification, verified procedure, site/side was marked, verified correct patient position, special equipment/implants available, medications/allergies/relevant history reviewed, required imaging and test results available.   Sterile Technique Maximal sterile technique including full sterile barrier drape, hand hygiene, sterile gown, sterile gloves, mask, hair covering, sterile ultrasound probe cover (if used).  Procedure Description Ultrasound was used to identify appropriate pleural anatomy for placement and overlying skin marked.  Area of drainage cleaned and draped in sterile fashion. Lidocaine was used to anesthetize the skin and subcutaneous tissue.  1100 cc's of amber appearing fluid was drained from the left pleural space. Catheter then removed and bandaid applied to site.   Complications/Tolerance None; patient tolerated the procedure well. Chest X-ray is ordered to confirm no post-procedural complication.   EBL Minimal   Specimen(s) None

## 2022-04-29 NOTE — Progress Notes (Signed)
Patient ID: David Huynh, male   DOB: January 28, 1941, 81 y.o.   MRN: [id number]     Advanced Heart Failure Rounding Note  PCP-Cardiologist: [name], MD   Subjective:    CVP 7 this AM after 4L urine output yesterday (negative 2.9). Reports improvement in orthopnea.   Objective:   Weight Range: 119.4 kg Body mass index is 35.7 kg/m.   Vital Signs:   Temp:  [97.3 F (36.3 C)-98.3 F (36.8 C)] 97.4 F (36.3 C) (12/16 1159) Pulse Rate:  [81-116] 83 (12/16 1159) Resp:  [16-22] 16 (12/16 1159) BP: (101-140)/(62-92) 101/62 (12/16 1159) SpO2:  [97 %-100 %] 97 % (12/16 1159) Weight:  [119.4 kg] 119.4 kg (12/16 0410) Last BM Date : 04/29/22  Weight change: Filed Weights   04/27/22 0522 04/28/22 0500 04/29/22 0410  Weight: 130.9 kg 122.9 kg 119.4 kg    Intake/Output:   Intake/Output Summary (Last 24 hours) at 04/29/2022 1258 Last data filed at 04/29/2022 1000 Gross per 24 hour  Intake 586.11 ml  Output 3451 ml  Net -2864.89 ml       Physical Exam    CVP 7 General: NAD Neck: JVP 10-12 cm, no thyromegaly or thyroid nodule.  Lungs: Clear to auscultation bilaterally with normal respiratory effort. CV: Nondisplaced PMI.  Heart tachy, regular S1/S2, no S3/S4, 2/6 SEM RUSB.  2+ edema to knees.  Abdomen: Soft, nontender, no hepatosplenomegaly, no distention.  Skin: Intact without lesions or rashes.  Neurologic: Alert and oriented x 3.  Psych: Normal affect. Extremities: 2+ edema to the thighs  HEENT: Normal.    Telemetry   Sinus tachycardia with intermittent SVT  EKG    N/A   Labs    CBC Recent Labs    04/28/22 0540 04/29/22 0505  WBC 8.8 9.6  HGB 12.2* 12.6*  HCT 38.3* 39.7  MCV 94.8 94.7  PLT 302 302    Basic Metabolic Panel Recent Labs    47/42/59 0540 04/29/22 0505  NA 138 139  K 3.9 3.9  CL 104 100  CO2 27 29  GLUCOSE 140* 115*  BUN 45* 43*  CREATININE 2.48* 2.52*  CALCIUM 7.8* 8.1*  MG  --  2.0    Liver Function Tests Recent  Labs    04/27/22 1448  PROT 4.5*    No results for input(s): "LIPASE", "AMYLASE" in the last 72 hours. Cardiac Enzymes No results for input(s): "CKTOTAL", "CKMB", "CKMBINDEX", "TROPONINI" in the last 72 hours.  BNP: BNP (last 3 results) Recent Labs    04/26/22 0100  BNP 1,368.4*     ProBNP (last 3 results) No results for input(s): "PROBNP" in the last 8760 hours.   D-Dimer No results for input(s): "DDIMER" in the last 72 hours. Hemoglobin A1C Recent Labs    04/26/22 1424  HGBA1C 5.8*    Fasting Lipid Panel No results for input(s): "CHOL", "HDL", "LDLCALC", "TRIG", "CHOLHDL", "LDLDIRECT" in the last 72 hours. Thyroid Function Tests No results for input(s): "TSH", "T4TOTAL", "T3FREE", "THYROIDAB" in the last 72 hours.  Invalid input(s): "FREET3"  Other results:   Imaging    DG CHEST PORT 1 VIEW  Result Date: 04/29/2022 CLINICAL DATA:  Dyspnea. Right pleural effusion. Chronic kidney disease and coronary artery disease. EXAM: PORTABLE CHEST 1 VIEW COMPARISON:  04/27/2022 FINDINGS: Mild cardiomegaly remains stable. Right arm PICC line remains in appropriate position. Increased airspace opacity is seen in the mid and lower lungs bilaterally which is symmetric, and highly suspicious for worsening pulmonary edema. Probable small  bilateral pleural effusions also noted. IMPRESSION: Increased airspace opacity in mid and lower lungs bilaterally, highly suspicious for worsening pulmonary edema. Probable small bilateral pleural effusions. Mild cardiomegaly. Electronically Signed   By: Danae Orleans M.D.   On: 04/29/2022 08:43     Medications:     Scheduled Medications:  apixaban  2.5 mg Oral BID   Chlorhexidine Gluconate Cloth  6 each Topical Daily   feeding supplement  237 mL Oral BID BM   hydrALAZINE  12.5 mg Oral Q8H   insulin aspart  0-15 Units Subcutaneous TID WC   insulin aspart  0-5 Units Subcutaneous QHS   isosorbide mononitrate  15 mg Oral Daily   metoprolol  succinate  50 mg Oral BID   multivitamin with minerals  1 tablet Oral Daily   potassium chloride  20 mEq Oral BID   rosuvastatin  40 mg Oral Daily   sodium chloride flush  10-40 mL Intracatheter Q12H   sodium chloride flush  3 mL Intravenous Q12H    Infusions:  sodium chloride     furosemide (LASIX) 200 mg in dextrose 5 % 100 mL (2 mg/mL) infusion 15 mg/hr (04/29/22 0252)    PRN Medications: sodium chloride, acetaminophen, hydrALAZINE, labetalol, levalbuterol, ondansetron (ZOFRAN) IV, sodium chloride flush, sodium chloride flush    Patient Profile   81 y.o. male with a history of CAD, chronic systolic CHF, HTN, HLD, DM, CKD 3, asthma, and PAF w/ h/o amiodarone lung toxicity, admitted w/ a/c CHF and recurrent AF w/ RVR.   Assessment/Plan   1. Acute on chronic CHF: Prior ischemic cardiomyopathy, improved after PCI in 2017.  Echo 03/2018 with EF back down to 35-40% and moderate-severe RV dysfunction. This was in the setting of atrial fibrillation with RVR. EF 40% on TEE 04/2018. Echo in 3/20 showed EF up to 50%.  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  Echo in 7/21 with EF 25-30%, severe RV dysfunction.  TEE in 10/21, however, showed EF up to 50%.  He had PCI to LAD in 10/21. Echo in 3/22 showed stable EF 50-55% with normal RV.   Echo in 8/22 with EF 45-50%, moderate LVH, normal RV, moderate AS.  He returns with marked volume overload in setting of recurrent AF with RVR.  No evidence for ACS, suspect exacerbation may be due to AF/RVR. He has been lost to follow-up. Echo this admit EF 40-45%, RV mildly reduced, moderate AS. PICC placed. Initial Co-ox 73%>> 74% today. CVP 10-11 but with marked peripheral edema and ongoing oxygen requirement. Excellent UOP with lasix gtt at 15/hr and metolazone 2.5 x 1. SCr 2.5 (stable since admission) - Continue Lasix gtt at 15/hr , will hold off on metolazone today. Slight rise in sCr, CVP 7, however still has 2+ edema to the  mid thighs.  - Apply unna boots , re-ordered today.  - Hydralazine/Imdur for afterload reduction with elevated creatinine and high BP.   2. Atrial fibrillation/flutter: As above, concerned for possible tachy-mediated CMP.  Had successful DC-CV on 03/29/18 but then back in atrial fibrillation. Ranolazine added, but failed DCCV again 04/02/18. DCCV 04/19/18 was successful.  AF with RVR at 3/22 admission, amiodarone restarted and he was cardioverted to NSR, but he developed amiodarone lung toxicity.  Amiodarone was stopped. Now s/p atrial fibrillation ablation in 6/22.  He is now back in atypical atrial flutter with RVR, not sure how long. Suspect this triggered recurrent CHF.  He does not appear to still  be on ranolazine at home. He had short periods of NSR yesterday with rate in 80s but generally in atrial flutter rate 120s.  - No amiodarone with history of lung toxicity, anti-arrhythmic options limited as he also has AKI on CKD stage 3.    - Continue Eliquis 2.5 mg bid (of note, has missed doses recently).  - Increase Toprol XL to 50 mg bid to try to rate control (on Coreg at home).  - EP has seen this admit. No good options for rhythm control acutely. He should follow-up with EP when stable to consider ablation candidacy.  - Intermittently appears to be in sinus tachycardia now. Will continue amiodarone, repeat EKG tomorrow.  3. AKI on CKD stage 3: Creatinine baseline in past (1 year ago) was around 1.6.   Creatinine today is 2.52  ?Cardiorenal.  - Hold Entresto, spironolactone for now (not totally clear that he was taking at home).  - Diurese as above, hopefully decrease in renal venous pressure will help renal function.  4. CAD: History of DES to RCA in 9/17.  Underwent LHC 04/03/18 with PCI to prox left circ. LHC in 10/21 with PCI to LAD.  No chest pain this admission.  Mild elevation in TnI but no trend, doubt ACS.  - He is on Eliquis, so no ASA.   - Continue statin.  - Defer cath with elevated  creatinine and no ACS.  5. Aortic stenosis: Moderate on 8/22 echo. Moderate on echo this admit, AVA 1.50 cm2, DI 0.34, mean gradient 17 mmHG.  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  He improved with steroids in the past.  7. Pleural effusion: S/p left pleural thoracentesis, 1.5 L (transudate).  May need right done eventually.   Lindalou Hose Keyaan Lederman 04/29/2022 12:58 PM

## 2022-04-30 DIAGNOSIS — I5043 Acute on chronic combined systolic (congestive) and diastolic (congestive) heart failure: Secondary | ICD-10-CM | POA: Diagnosis not present

## 2022-04-30 DIAGNOSIS — I509 Heart failure, unspecified: Secondary | ICD-10-CM | POA: Diagnosis not present

## 2022-04-30 DIAGNOSIS — N049 Nephrotic syndrome with unspecified morphologic changes: Secondary | ICD-10-CM

## 2022-04-30 LAB — CBC
HCT: 39.5 % (ref 39.0–52.0)
Hemoglobin: 13 g/dL (ref 13.0–17.0)
MCH: 30 pg (ref 26.0–34.0)
MCHC: 32.9 g/dL (ref 30.0–36.0)
MCV: 91.2 fL (ref 80.0–100.0)
Platelets: 327 10*3/uL (ref 150–400)
RBC: 4.33 MIL/uL (ref 4.22–5.81)
RDW: 14.9 % (ref 11.5–15.5)
WBC: 7.7 10*3/uL (ref 4.0–10.5)
nRBC: 0 % (ref 0.0–0.2)

## 2022-04-30 LAB — GLUCOSE, CAPILLARY
Glucose-Capillary: 111 mg/dL — ABNORMAL HIGH (ref 70–99)
Glucose-Capillary: 137 mg/dL — ABNORMAL HIGH (ref 70–99)
Glucose-Capillary: 136 mg/dL — ABNORMAL HIGH (ref 70–99)
Glucose-Capillary: 118 mg/dL — ABNORMAL HIGH (ref 70–99)

## 2022-04-30 LAB — BODY FLUID CULTURE W GRAM STAIN: Culture: NO GROWTH

## 2022-04-30 LAB — BASIC METABOLIC PANEL
Anion gap: 8 (ref 5–15)
BUN: 44 mg/dL — ABNORMAL HIGH (ref 8–23)
CO2: 31 mmol/L (ref 22–32)
Calcium: 7.9 mg/dL — ABNORMAL LOW (ref 8.9–10.3)
Chloride: 99 mmol/L (ref 98–111)
Creatinine, Ser: 2.35 mg/dL — ABNORMAL HIGH (ref 0.61–1.24)
GFR, Estimated: 27 mL/min — ABNORMAL LOW (ref >60)
Glucose, Bld: 103 mg/dL — ABNORMAL HIGH (ref 70–99)
Potassium: 3.5 mmol/L (ref 3.5–5.1)
Sodium: 138 mmol/L (ref 135–145)

## 2022-04-30 LAB — COOXEMETRY PANEL
Carboxyhemoglobin: 1.8 % — ABNORMAL HIGH (ref 0.5–1.5)
Methemoglobin: <0.7 % (ref 0.0–1.5)
O2 Saturation: 70.3 %
Total hemoglobin: 12.8 g/dL (ref 12.0–16.0)

## 2022-04-30 LAB — SODIUM, URINE, RANDOM: Sodium, Ur: 107 mmol/L

## 2022-04-30 LAB — CREATININE, URINE, RANDOM: Creatinine, Urine: 31 mg/dL

## 2022-04-30 MED ORDER — SODIUM CHLORIDE 0.45 % IV SOLN: INTRAVENOUS | Status: DC

## 2022-04-30 NOTE — Assessment & Plan Note (Addendum)
-   Consult nephrology, appreciate expertise - Unna boots

## 2022-04-30 NOTE — Progress Notes (Signed)
Patient ID: BEVERLY SURIANO, male   DOB: 05-03-1941, 81 y.o.   MRN: [id number]     Advanced Heart Failure Rounding Note  PCP-Cardiologist: [name], MD   Subjective:    - Negative 2.5 L over the past 24H  - Thoracentesis yesterday with >1L removed.   Objective:   Weight Range: 115.1 kg Body mass index is 34.41 kg/m.   Vital Signs:   Temp:  [97.6 F (36.4 C)-97.9 F (36.6 C)] 97.9 F (36.6 C) (12/17 1159) Pulse Rate:  [74-126] 93 (12/17 1159) Resp:  [17-20] 18 (12/17 1159) BP: (73-147)/(44-94) 128/91 (12/17 1159) SpO2:  [90 %-100 %] 96 % (12/17 1159) Weight:  [115.1 kg] 115.1 kg (12/17 0354) Last BM Date : 04/29/22  Weight change: Filed Weights   04/28/22 0500 04/29/22 0410 04/30/22 0354  Weight: 122.9 kg 119.4 kg 115.1 kg    Intake/Output:   Intake/Output Summary (Last 24 hours) at 04/30/2022 1306 Last data filed at 04/30/2022 1150 Gross per 24 hour  Intake 899.42 ml  Output 2503 ml  Net -1603.58 ml       Physical Exam     General: NAD Neck: JVP 8-9cm, no thyromegaly or thyroid nodule.  Lungs: Clear to auscultation bilaterally with normal respiratory effort. CV: Nondisplaced PMI.  Heart tachy, regular S1/S2, no S3/S4, 2/6 SEM RUSB.  2+ edema to knees.  Abdomen: Soft, nontender, no hepatosplenomegaly, no distention.  Skin: Intact without lesions or rashes.  Neurologic: Alert and oriented x 3.  Psych: Normal affect. Extremities: 2+ edema to the thighs  HEENT: Normal.    Telemetry   Sinus tachycardia with intermittent SVT  EKG    N/A   Labs    CBC Recent Labs    04/29/22 0505 04/30/22 0445  WBC 9.6 7.7  HGB 12.6* 13.0  HCT 39.7 39.5  MCV 94.7 91.2  PLT 302 327    Basic Metabolic Panel Recent Labs    58/52/77 0505 04/30/22 0445  NA 139 138  K 3.9 3.5  CL 100 99  CO2 29 31  GLUCOSE 115* 103*  BUN 43* 44*  CREATININE 2.52* 2.35*  CALCIUM 8.1* 7.9*  MG 2.0  --     Liver Function Tests Recent Labs    04/27/22 1448   PROT 4.5*    No results for input(s): "LIPASE", "AMYLASE" in the last 72 hours. Cardiac Enzymes No results for input(s): "CKTOTAL", "CKMB", "CKMBINDEX", "TROPONINI" in the last 72 hours.  BNP: BNP (last 3 results) Recent Labs    04/26/22 0100  BNP 1,368.4*     ProBNP (last 3 results) No results for input(s): "PROBNP" in the last 8760 hours.   D-Dimer No results for input(s): "DDIMER" in the last 72 hours. Hemoglobin A1C No results for input(s): "HGBA1C" in the last 72 hours.  Fasting Lipid Panel No results for input(s): "CHOL", "HDL", "LDLCALC", "TRIG", "CHOLHDL", "LDLDIRECT" in the last 72 hours. Thyroid Function Tests No results for input(s): "TSH", "T4TOTAL", "T3FREE", "THYROIDAB" in the last 72 hours.  Invalid input(s): "FREET3"  Other results:   Imaging    DG Chest Port 1 View  Result Date: 04/29/2022 CLINICAL DATA:  Status post thoracentesis EXAM: PORTABLE CHEST 1 VIEW COMPARISON:  Same day chest radiograph FINDINGS: Stable cardiomegaly. Right PICC line remains in place. Significant improved aeration of the lung bases with decreased pleural effusions status post thoracentesis. No pneumothorax. IMPRESSION: Significant improved aeration of the lung bases with decreased pleural effusions status post thoracentesis. No pneumothorax. Electronically Signed  By: Davina Poke D.O.   On: 04/29/2022 16:29     Medications:     Scheduled Medications:  apixaban  2.5 mg Oral BID   Chlorhexidine Gluconate Cloth  6 each Topical Daily   feeding supplement  237 mL Oral BID BM   hydrALAZINE  12.5 mg Oral Q8H   insulin aspart  0-15 Units Subcutaneous TID WC   insulin aspart  0-5 Units Subcutaneous QHS   isosorbide mononitrate  15 mg Oral Daily   metoprolol succinate  50 mg Oral BID   multivitamin with minerals  1 tablet Oral Daily   potassium chloride  20 mEq Oral BID   rosuvastatin  40 mg Oral Daily   sodium chloride flush  10-40 mL Intracatheter Q12H   sodium  chloride flush  3 mL Intravenous Q12H    Infusions:  [START ON 05/01/2022] sodium chloride     sodium chloride     furosemide (LASIX) 200 mg in dextrose 5 % 100 mL (2 mg/mL) infusion 15 mg/hr (04/30/22 0833)    PRN Medications: sodium chloride, acetaminophen, hydrALAZINE, labetalol, levalbuterol, ondansetron (ZOFRAN) IV, sodium chloride flush, sodium chloride flush    Patient Profile   81 y.o. male with a history of CAD, chronic systolic CHF, HTN, HLD, DM, CKD 3, asthma, and PAF w/ h/o amiodarone lung toxicity, admitted w/ a/c CHF and recurrent AF w/ RVR.   Assessment/Plan   1. Acute on chronic CHF: Prior ischemic cardiomyopathy, improved after PCI in 2017.  Echo 03/2018 with EF back down to 35-40% and moderate-severe RV dysfunction. This was in the setting of atrial fibrillation with RVR. EF 40% on TEE 04/2018. Echo in 3/20 showed EF up to 50%.  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  Echo in 7/21 with EF 25-30%, severe RV dysfunction.  TEE in 10/21, however, showed EF up to 50%.  He had PCI to LAD in 10/21. Echo in 3/22 showed stable EF 50-55% with normal RV.   Echo in 8/22 with EF 45-50%, moderate LVH, normal RV, moderate AS.  He returns with marked volume overload in setting of recurrent AF with RVR.  No evidence for ACS, suspect exacerbation may be due to AF/RVR. He has been lost to follow-up. Echo this admit EF 40-45%, RV mildly reduced, moderate AS. PICC placed. Initial Co-ox 73%>> 74% today. CVP 10-11 but with marked peripheral edema and ongoing oxygen requirement. Excellent UOP with lasix gtt at 15/hr and metolazone 2.5 x 1. SCr 2.5 (stable since admission) - sCr continuing to improve. JVP now 8. Still has 2-3+ pitting edema in b/l LE that will likely improve slowly. Will continue lasix gtt through today with plan to transition to intermittent dosing tomorrow.  CO-OX stable at 70.  - Apply unna boots , re-ordered yesterday. - Hydralazine/Imdur for  afterload reduction with elevated creatinine and high BP.   2. Atrial fibrillation/flutter: As above, concerned for possible tachy-mediated CMP.  Had successful DC-CV on 03/29/18 but then back in atrial fibrillation. Ranolazine added, but failed DCCV again 04/02/18. DCCV 04/19/18 was successful.  AF with RVR at 3/22 admission, amiodarone restarted and he was cardioverted to NSR, but he developed amiodarone lung toxicity.  Amiodarone was stopped. Now s/p atrial fibrillation ablation in 6/22.  He is now back in atypical atrial flutter with RVR, not sure how long. Suspect this triggered recurrent CHF.  He does not appear to still be on ranolazine at home. He had short periods of NSR yesterday  with rate in 80s but generally in atrial flutter rate 120s.  - No amiodarone with history of lung toxicity, anti-arrhythmic options limited as he also has AKI on CKD stage 3.    - Continue Eliquis 2.5 mg bid (of note, has missed doses recently).  - Increase Toprol XL to 50 mg bid to try to rate control (on Coreg at home).  - EP has seen this admit. No good options for rhythm control acutely. He should follow-up with EP when stable to consider ablation candidacy.  - Atrial fibrillation today on telemetry.  3. AKI on CKD stage 3: Creatinine baseline in past (1 year ago) was around 1.6.   Creatinine today is 2.52  ?Cardiorenal.  - Hold Entresto, spironolactone for now (not totally clear that he was taking at home).  - Diurese as above, hopefully decrease in renal venous pressure will help renal function.  4. CAD: History of DES to RCA in 9/17.  Underwent LHC 04/03/18 with PCI to prox left circ. LHC in 10/21 with PCI to LAD.  No chest pain this admission.  Mild elevation in TnI but no trend, doubt ACS.  - He is on Eliquis, so no ASA.   - Continue statin.  - Defer cath with elevated creatinine and no ACS.  5. Aortic stenosis: Moderate on 8/22 echo. Moderate on echo this admit, AVA 1.50 cm2, DI 0.34, mean gradient 17 mmHG.   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  He improved with steroids in the past.  7. Pleural effusion: S/p left pleural thoracentesis, 1.5 L (transudate).  May need right done eventually.   Abdulloh Ullom 04/30/2022 1:06 PM

## 2022-04-30 NOTE — Progress Notes (Signed)
Nutrition Follow-up RD working remotely.   DOCUMENTATION CODES:   Obesity unspecified  INTERVENTION:  - continue Ensure BID. - continue to encourage PO intakes at meals with a focus on adequate protein intake.   NUTRITION DIAGNOSIS:   Inadequate oral intake related to acute illness as evidenced by per patient/family report. -resolving  GOAL:   Patient will meet greater than or equal to 90% of their needs -progressing  MONITOR:   PO intake, Supplement acceptance, Labs, Weight trends, I & O's  REASON FOR ASSESSMENT:   Consult Assessment of nutrition requirement/status  ASSESSMENT:   Pt admitted from [facility] with SOB r/t acute on chronic CHF. PMH significant for combined CHF (EF 45-50%), mild AS, PAF, amio pulm tox, CAD, DM2, CKD 3 and HTN.  Patient was assessed in person by a RD on 12/15; note reviewed by this RD this AM. Ensure ordered BID on that date and he has accepted supplement 100% of the time offered since then.   Per flow sheet documentation, he ate 90% of breakfast and 100% of lunch and dinner on 12/15 (total of 927 kcal and 44 grams protein) and 100% of breakfast today (328 kcal and 14 grams protein).  Weight is trending down with lasix drip and he is -20 lb compared to weight on admission on 12/13. Non-pitting edema to BUE and mild pitting edema to BLE documented in the edema section of flow sheet.    Labs reviewed; CBG: 111 mg/dl, BUN: 44 mg/dl, creatinine: 4.12 mg/dl, Ca: 7.9 mg/dl, GFR: 27 ml/min.  Medications reviewed; lasix zt 7.5 ml/hr, sliding scale novolog, 1 tablet multivitamin with minerals/day, 20 mEq Klor-Con BID.  Diet Order:   Diet Order             Diet NPO time specified  Diet effective midnight           Diet 2 gram sodium Fluid consistency: Thin; Fluid restriction: 1800 mL Fluid  Diet effective now                   EDUCATION NEEDS:   Education needs have been addressed  Skin:  Skin Assessment: Reviewed RN  Assessment  Last BM:  12/16 (large x1, type not documented)  Height:   Ht Readings from Last 1 Encounters:  04/26/22 6' (1.829 m)    Weight:   Wt Readings from Last 1 Encounters:  04/30/22 115.1 kg     BMI:  Body mass index is 34.41 kg/m.  Estimated Nutritional Needs:  Kcal:  2000-2200 Protein:  100-115g Fluid:  >/=2L     Trenton Gammon, MS, RD, LDN, CNSC Clinical Dietitian PRN/Relief staff On-call/weekend pager # available in [facility]

## 2022-04-30 NOTE — Progress Notes (Signed)
  Progress Note   Patient: David Huynh:[id number] DOB: [birth year] DOA: 04/26/2022     3 DOS: the patient was seen and examined on 04/30/2022 at 10:15 AM      Brief hospital course: David Huynh is an 81 y.o. M with sdCHF EF 45-50%, pAF on Eliquis, obesity, DM, CKD IIIb, HTN and hx amio lung fibrosis who presented with progressive SOB and edema.  Wife reported poor compliance with meds.    12/13: Admitted in respiratory distress on BiPAP, Lasix started; Cardiology consulted 12/14: Still on BiPAP; Pulmonology consulted; underwent thoracentesis 12/15: Breathing improved 12/16: Repeat thoracentesis     Assessment and Plan: * Acute on chronic combined systolic and diastolic CHF (congestive heart failure) (HCC) Weight is down 4 kg, net -2.2 L yesterday, creatinine down to 2.3, BUN improved - Continue Lasix drip - Continue metoprolol, Imdur, hydralazine - Hold Entresto, spironolactone at the moment     Atrial fibrillation with RVR (HCC) Heart rate is controlled, has been out of sinus rhythm Amiodarone contraindicated - Continue Eliquis, metoprolol  Nephrotic syndrome Patient was taking Aleve daily for several weeks in early October prior to onset of his swelling and congestive heart failure.  Unclear the time course of his renal failure, and if this high-grade proteinuria is simply progression of his diabetic nephropathy, or primary nephrotic syndrome. - Consult nephrology, appreciate expertise  Acute kidney injury on CKD stage III yea Baseline creatinine 1.6.  2.5 on admission, improved little bit today to 2.3  Protein creatinine ratio 7  -Hold Entresto spironolactone - Consult nephrology - Trend BMP   Hypoalbuminemia -Start Ensure - See above  Amidorone associated Pulmonary fibrosis (HCC) See prior summary.  Bilateral pleural effusion -Consult pulmonology  Type 2 diabetes mellitus with stage 3a chronic kidney disease, with long-term current use of  insulin (HCC) Glucoses controlled - Continue SS corrections    Essential hypertension BP controlled - Continue hydralazine, Imdur, metoprolol, furosemide  Mild persistent asthma No wheezing - PRN levalbuterol  Hyperlipidemia with target LDL less than 70 - Continue Crestor  Obesity BMI >30          Subjective: Patient is feeling better better, no confusion, no respiratory distress, no chest pain.     Physical Exam: BP (!) 128/91 (BP Location: Left Arm)   Pulse 93   Temp 97.9 F (36.6 C) (Oral)   Resp 18   Ht 6' (1.829 m)   Wt 115.1 kg   SpO2 96%   BMI 34.41 kg/m   Adult male, lying in bed, no acute distress RRR, no murmurs that I can appreciate, he has 2+ pitting edema of the legs still Respiratory rate seems normal, lung sounds diminished, no rales or wheezes appreciated Abdomen soft without tenderness palpation Attention normal, affect appropriate, judgment insight appear normal  Data Reviewed: Discussed with nephrology Creatinine 2.3, BUN down to the 40s SCV O2 70 Protein creatinine ratio greater than 7 Glucose is controlled Urinalysis with proteinuria and pyuria, asymptomatic CBC normal  Family Communication: None present    Disposition: Status is: Inpatient         Author: Alberteen Sam, MD 04/30/2022 1:43 PM  For on call review [email or web address].

## 2022-04-30 NOTE — Consult Note (Addendum)
Renal Service Consult Note [facility]  David Huynh 04/30/2022 [name], MD Requesting Physician: Dr. Loleta Books  Reason for Consult: Renal failure HPI: The patient is a 81 y.o. year-old w/ PMH as below who presented to ED c/o SOB and leg edema worsening for 1 month. Hx of CHF, has not run out of diuretics. Pt was admitted and started on IV lasix and CHF pathway. Cardiology consulted and confirmed a/c combined HF, also hx of severe CAD sp stents and all 3 major vessels. Also hx amio lung toxicity, mod AS and afib/ RVR sp ablation. They ^'d IV lasix to 159m bid. Entresto, aldactone and jardiance were put on hold. Pt diuresed as is down about 11 L net negative. He also had bilat thoracenteses this weekend by CCM. IV lasix gtt has been running,  perhaps IV dobutamine as well. Getting po hydralazine, imdur and toprol xl my mouth. Creat has stayed up in mid 2s while here and UP/C ratio came back at 7.7 gm / 24 hr, we are asked to see for renal failure.   Pt seen in room. States his swelling was a lot worse and has gone down a lot since arriving here. Says he had bilat thoracentesis this weekend.   Pt states he had a bad dental infection sometime not too long ago, that was complicated by pain (also maybe a rash?) down his R neck and shoulder and then down to his hand, they said it was probably autoimmune. That has resolved.   ROS - denies CP, no joint pain, no HA, no blurry vision, no rash, no diarrhea, no nausea/ vomiting, no dysuria, no difficulty voiding   Past Medical History  Past Medical History:  Diagnosis Date   Allergy    seasonal   Asthma    mild, intermittent   Atrial fibrillation with rapid ventricular response ([place] 03/25/2018   CAD, multiple vessel    Three-vessel disease involving mid RCA 85% (PCI with DES), OM 2 80% in branch and ostD1 ~90%. Only the mid RCA with PCI target.    Carotid bruit 08/05/2010   Bruit noted on left    Chronic combined  systolic and diastolic CHF (congestive heart failure) (HCC)    CKD (chronic kidney disease), stage III ([place]    Diabetes mellitus    type 2   Fracture of multiple ribs 02/12/2015   Hemothorax on right 01/23/2015   Hyperlipidemia    Hypertension    Hypocalcemia 02/12/2015   Lactic acidosis 03/25/2018   Liver function study, abnormal 04/26/2011   Multiple rib fractures    Obesity    Persistent atrial fibrillation ([place]    Pneumonia    Past Surgical History  Past Surgical History:  Procedure Laterality Date   APPENDECTOMY  1975   ATRIAL FIBRILLATION ABLATION N/A 10/26/2020   Procedure: ATRIAL FIBRILLATION ABLATION;  Surgeon: AThompson Grayer MD;  [place] [facility];  Service: Cardiovascular;  Laterality: N/A;   CARDIAC CATHETERIZATION N/A 02/17/2016   Procedure: Left Heart Cath and Coronary Angiography;  Surgeon: DLeonie Man MD;  [place] [facility];  Service: Cardiovascular: mRCA 85% (PCI), branch of OM2 80%,  ostD1 90%; EF 35-45%   CARDIAC CATHETERIZATION N/A 02/17/2016   Procedure: Coronary Stent Intervention;  Surgeon: DLeonie Man MD;  [place] [facility];  Service: Cardiovascular: mRCA PCI : SRonna PolioPREM MR [id number]   CARDIOVERSION N/A 03/29/2018   Procedure: CARDIOVERSION;  Surgeon: MLarey Dresser MD;  Location: [place];  Service: Cardiovascular;  Laterality: N/A;   CARDIOVERSION N/A 04/02/2018   Procedure: CARDIOVERSION;  Surgeon: Larey Dresser, MD;  Location: [facility] ENDOSCOPY;  Service: Cardiovascular;  Laterality: N/A;   CARDIOVERSION N/A 04/19/2018   Procedure: CARDIOVERSION;  Surgeon: Larey Dresser, MD;  Location: [facility] ENDOSCOPY;  Service: Cardiovascular;  Laterality: N/A;   CARDIOVERSION N/A 08/11/2020   Procedure: CARDIOVERSION;  Surgeon: Larey Dresser, MD;  Location: [facility] ENDOSCOPY;  Service: Cardiovascular;  Laterality: N/A;   CARDIOVERSION N/A 12/28/2020   Procedure: CARDIOVERSION;  Surgeon: Larey Dresser, MD;  Location: [facility]  ENDOSCOPY;  Service: Cardiovascular;  Laterality: N/A;   CATARACT EXTRACTION  2010   b/l   CORONARY STENT INTERVENTION N/A 04/03/2018   Procedure: CORONARY STENT INTERVENTION;  Surgeon: Troy Sine, MD;  Location: [place] CV LAB;  Service: Cardiovascular;  Laterality: N/A;   CORONARY STENT INTERVENTION N/A 03/02/2020   Procedure: CORONARY STENT INTERVENTION;  Surgeon: Jettie Booze, MD;  Location: [place] CV LAB;  Service: Cardiovascular;  Laterality: N/A;   INTRAVASCULAR ULTRASOUND/IVUS N/A 03/02/2020   Procedure: Intravascular Ultrasound/IVUS;  Surgeon: Jettie Booze, MD;  Location: [place] CV LAB;  Service: Cardiovascular;  Laterality: N/A;   RETINAL LASER PROCEDURE  1985   RIGHT/LEFT HEART CATH AND CORONARY ANGIOGRAPHY N/A 04/03/2018   Procedure: RIGHT/LEFT HEART CATH AND CORONARY ANGIOGRAPHY;  Surgeon: Larey Dresser, MD;  Location: [place] CV LAB;  Service: Cardiovascular;  Laterality: N/A;   RIGHT/LEFT HEART CATH AND CORONARY ANGIOGRAPHY N/A 03/02/2020   Procedure: RIGHT/LEFT HEART CATH AND CORONARY ANGIOGRAPHY;  Surgeon: Larey Dresser, MD;  Location: [place] CV LAB;  Service: Cardiovascular;  Laterality: N/A;   TEE WITHOUT CARDIOVERSION N/A 03/29/2018   Procedure: TRANSESOPHAGEAL ECHOCARDIOGRAM (TEE);  Surgeon: Larey Dresser, MD;  Location[facility] ENDOSCOPY;  Service: Cardiovascular;  Laterality: N/A;   TEE WITHOUT CARDIOVERSION N/A 04/19/2018   Procedure: TRANSESOPHAGEAL ECHOCARDIOGRAM (TEE);  Surgeon: Larey Dresser, MD;  Location: [facility] ENDOSCOPY;  Service: Cardiovascular;  Laterality: N/A;   TEE WITHOUT CARDIOVERSION N/A 03/02/2020   Procedure: TRANSESOPHAGEAL ECHOCARDIOGRAM (TEE);  Surgeon: Larey Dresser, MD;  Location: [facility] ENDOSCOPY;  Service: Cardiovascular;  Laterality: N/A;   TEE WITHOUT CARDIOVERSION N/A 10/26/2020   Procedure: TRANSESOPHAGEAL ECHOCARDIOGRAM (TEE);  Surgeon: Acie Fredrickson Wonda Cheng, MD;  Location: [place];  Service:  Cardiovascular;  Laterality: N/A;   [place] ECHOCARDIOGRAM  02/2016   Mild LVH. Moderately reduced EF of 35-40% with diffuse hypokinesis. Indeterminate diastolic function. Likely elevated LA pressures. Mild aortic stenosis (mean gradient 10 mmHg). Mild MR. Mild LA dilation. Mild to moderate PA hypertension with 39 mmHg   VIDEO ASSISTED THORACOSCOPY (VATS)/THOROCOTOMY Right 01/24/2015   Procedure: VIDEO ASSISTED THORACOSCOPY (VATS) FOR DRAINAGE OF RIGHT HEMOTHORAX;  Surgeon: Gaye Pollack, MD;  Location: [facility];  Service: Thoracic;  Laterality: Right;   Family History  Family History  Problem Relation Age of Onset   CAD Neg Hx    Social History  reports that he has never smoked. He has never used smokeless tobacco. He reports current alcohol use. He reports that he does not use drugs. Allergies  Allergies  Allergen Reactions   Amiodarone Shortness Of Breath    Pulmonary toxicity   Home medications Prior to Admission medications   Medication Sig Start Date End Date Taking? Authorizing Provider  acetaminophen (TYLENOL) 500 MG tablet Take 1,000 mg by mouth every 6 (six) hours as needed for moderate  pain or headache.   Yes [provider]  ADVAIR DISKUS 100-50 MCG/ACT AEPB INHALE 1 PUFF INTO THE LUNGS TWICE DAILY Patient taking differently: Inhale 1 puff into the lungs 2 (two) times daily. 02/20/22  Yes [name], MD  albuterol (VENTOLIN HFA) 108 (90 Base) MCG/ACT inhaler Inhale 2 puffs into the lungs every 6 (six) hours as needed for wheezing or shortness of breath. 02/27/22  Yes [name], MD  apixaban (ELIQUIS) 5 MG TABS tablet TAKE 1 TABLET(5 MG) BY MOUTH TWICE DAILY Patient taking differently: Take 5 mg by mouth 2 (two) times daily. 07/01/21  Yes Larey Dresser, MD  carvedilol (COREG) 25 MG tablet Take 12.5 mg by mouth 2 (two) times daily with a meal.   Yes [provider]  cholecalciferol (VITAMIN D) 1000 units  tablet Take 1 tablet (1,000 Units total) by mouth daily. 12/09/19  Yes Amin, Ankit Chirag, MD  ezetimibe (ZETIA) 10 MG tablet TAKE 1 TABLET(10 MG) BY MOUTH DAILY Patient taking differently: Take 10 mg by mouth daily. 08/03/21  Yes Larey Dresser, MD  HUMALOG KWIKPEN 100 UNIT/ML KwikPen USE AS DIRECTED. MAX DOSE OF 15 UNITS 01/02/22  Yes Shamleffer, Melanie Crazier, MD  icosapent Ethyl (VASCEPA) 1 g capsule TAKE 2 CAPSULES(2 GRAMS) BY MOUTH TWICE DAILY Patient taking differently: Take 2 g by mouth 2 (two) times daily. 09/27/21  Yes Larey Dresser, MD  insulin glargine (LANTUS SOLOSTAR) 100 UNIT/ML Solostar Pen INJECT 12 UNITS INTO THE SKIN AT BEDTIME 03/18/22  Yes Shamleffer, Melanie Crazier, MD  metFORMIN (GLUCOPHAGE) 1000 MG tablet TAKE 1 TABLET(1000 MG) BY MOUTH DAILY WITH BREAKFAST Patient taking differently: Take 1,000 mg by mouth daily with breakfast. 04/27/21  Yes Shamleffer, Melanie Crazier, MD  nitroGLYCERIN (NITROSTAT) 0.4 MG SL tablet Place 1 tablet (0.4 mg total) under the tongue every 5 (five) minutes as needed for chest pain. 12/09/19  Yes Amin, Ankit Chirag, MD  rosuvastatin (CRESTOR) 40 MG tablet TAKE 1 TABLET(40 MG) BY MOUTH DAILY. Need lab work for future refills. Patient taking differently: Take 40 mg by mouth daily. 12/22/21  Yes [name], MD  sacubitril-valsartan (ENTRESTO) 24-26 MG Take 1 tablet by mouth 2 (two) times daily. NEEDS FOLLOW UP APPOINTMENT FOR ANYMORE REFILLS 01/12/22  Yes Larey Dresser, MD  spironolactone (ALDACTONE) 25 MG tablet TAKE 1 TABLET(25 MG) BY MOUTH DAILY Patient taking differently: Take 25 mg by mouth daily. 03/20/22  Yes [place], Maricela Bo, FNP  torsemide (DEMADEX) 10 MG tablet TAKE 1 TABLET(10 MG) BY MOUTH IN THE MORNING Patient taking differently: Take 10 mg by mouth daily. 02/28/22  Yes [place], [place], FNP  glucose blood test strip Use as instructed 09/03/20   [name], MD  Insulin Pen Needle (PEN NEEDLES) 32G X 4 MM MISC Inject 1  Device into the skin in the morning, at noon, in the evening, and at bedtime. Use 1 at bedtime with Lantus.  Dx code: E11.9 11/30/20   Shamleffer, Melanie Crazier, MD     Vitals:   04/29/22 2350 04/30/22 0354 04/30/22 0800 04/30/22 1159  BP: (!) 147/94 125/66 (!) 121/56 (!) 128/91  Pulse: 92 83 86 93  Resp: _0 Temp: 97.8 F (36.6 C) 97.9 F (36.6 C) 97.9 F (36.6 C) 97.9 F (36.6 C)  TempSrc: Oral Oral Oral Oral  SpO2: 97% 100% 96% 96%  Weight:  115.1 kg    Height:       Exam Gen alert,  no distress [place] 2L O2 No rash, cyanosis or gangrene Sclera anicteric, throat clear  No jvd or bruits Chest mostly clear, dec'd bilat bases RRR no MRG Abd soft ntnd no mass or ascites +bs GU condom cath in place, good UOP MS no joint effusions or deformity Ext 2-3+ bilat LE edema w/ wrinkling, 1-2+ bilat UE edema resolving Neuro is alert, Ox 3 , nf, no asterixis    Home meds include - advair, albuterol, eliquis, coreg 12.5bid, zetia, insulin humalog/ glargine, metformin, sl ntg, crestor, sacubitril-valsartan, aldactone, demadex 10 qd, prns/ vits/ supps     Date   Creat  eGFR  2009- 2017  0.70 - 1.20  2020   1.29  2021   1.06- 1.75  Jan- jun 2022  1.25- 2.50 July - nov 2022           1.19- 1.67              03/17/21  1.67  41 ml/min             03/24/21  1.57  43 ml/min              12/13   2.48               12/15   2.48                12/16  2.52                12/17  2.35    UA turbid - prot > 300, many bact, 21-50 rbc, > 50 wbc    UP/C ratio - 7.7 gm prot/ gm creat     Renal US - 11.4/ 10.5 cm kidneys w/o hydro    Total I/O = 3.6 L in and 15 L out = net neg 11.4 L     Admit wt 125- 130kg, lowest wt 115kg, today's wt 115 kg    Creat 2.38- 2.52 since admit      Alb 1.8 on admit     LFT's okay  BNP 1368 on admit      BUN 44, creat 2.35 today, Hb 13, WBC 7K       CXR 12/13 - bilat congestion/ edema+ R > L effusions       CXR 12/16 - improved bilat effusions, resolved  edema      HTN - hydralazine 12.5, imdur, toprol xl 50 bid, lasix 33m/h  Assessment/ Plan: AKI on CKD 3b - b/l creat 1.5- 1.7 from November 2022. Creat here 2.5 in setting of decompensated CHF/ anasarca, creat hasn't improved much w/ sig diuresis. UA looks infected w/ wbc/rbc's and large protein. Renal [place]aw/o hydro. Work-up shows sig proteinuria 7.7 gm. Has hx DM2 but w/o extensive diabetic complications. +excess nsaid use recently also. 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 Will send serologies. Good UOP on IV lasix gtt per cardiology. Will follow.  Vol overload/ anasarca - a/c combined CHF and/or nephrotic syndrome/ CKD.  Alb 1.8 here on admission. Diuretics ongoing and is down 10kg from admission w/ sig edema remaining.  Afib / RVR - not amio candidate, on BB and eliquis' Amio - associated pulm fibrosis HTN - meds as above, BP's good Obesity - BMI > 30 Pyuria - will send urine cx, no symptoms    Rob Ivonna Kinnick  MD 04/30/2022, 3:26 PM Recent Labs  Lab 04/26/22 0306 04/27/22 0000 04/29/22 0505 04/30/22  0445  HGB  --    < > 12.6* 13.0  ALBUMIN 1.8*  --   --   --   CALCIUM  --    < > 8.1* 7.9*  CREATININE  --    < > 2.52* 2.35*  K  --    < > 3.9 3.5   < > = values in this interval not displayed.   Inpatient medications:  apixaban  2.5 mg Oral BID   Chlorhexidine Gluconate Cloth  6 each Topical Daily   feeding supplement  237 mL Oral BID BM   hydrALAZINE  12.5 mg Oral Q8H   insulin aspart  0-15 Units Subcutaneous TID WC   insulin aspart  0-5 Units Subcutaneous QHS   isosorbide mononitrate  15 mg Oral Daily   metoprolol succinate  50 mg Oral BID   multivitamin with minerals  1 tablet Oral Daily   potassium chloride  20 mEq Oral BID   rosuvastatin  40 mg Oral Daily   sodium chloride flush  10-40 mL Intracatheter Q12H   sodium chloride flush  3 mL Intravenous Q12H    [START ON  05/01/2022] sodium chloride     sodium chloride     furosemide (LASIX) 200 mg in dextrose 5 % 100 mL (2 mg/mL) infusion 15 mg/hr (04/30/22 0833)   sodium chloride, acetaminophen, hydrALAZINE, labetalol, levalbuterol, ondansetron (ZOFRAN) IV, sodium chloride flush, sodium chloride flush

## 2022-04-30 NOTE — Evaluation (Signed)
Occupational Therapy Evaluation Patient Details Name: David Huynh MRN: [id number] DOB: 28-Mar-1941 Today's Date: 04/30/2022   History of Present Illness Patient is a 81 y/o male who presents on 12/13 with SOB for 1 month and edema in BLEs. Found to have A-fib with RVR, AKI on CKD and acute on chronic CHF exacerbation. s/p thoracentesis 12/14. PMH includes COVID 19, A-fib, CAD, CHF, CKD III, DM, HTN, obesity, chronic lung inflammation.   Clinical Impression   Patient admitted to the hospital 2/2 the above medical reasons and reports PLOF of being Independent in ADLs and some IADLs such as driving and med mgmt but has housekeeper and goes to dining hall for meals.  Patient lives with his wife in [place] and reports not using AD until recently for functional mobility since he had become weak.  Patient requires min A for sit to stand and min A for LB ADLs along with min guard for functional mobility with RW.  Patient would benefit from [facility] to address general weakness, decreased safety with ADLs, to prevent falls and for patient to return to PLOF.     Recommendations for follow up therapy are one component of a multi-disciplinary discharge planning process, led by the attending physician.  Recommendations may be updated based on patient status, additional functional criteria and insurance authorization.   Follow Up Recommendations  Home health OT     Assistance Recommended at Discharge Set up Supervision/Assistance  Patient can return home with the following A little help with bathing/dressing/bathroom;Assist for transportation;Help with stairs or ramp for entrance    Functional Status Assessment  Patient has had a recent decline in their functional status and demonstrates the ability to make significant improvements in function in a reasonable and predictable amount of time.  Equipment Recommendations       Recommendations for Other Services       Precautions / Restrictions  Precautions Precautions: Fall;Other (comment) Precaution Comments: watch 02 Restrictions Weight Bearing Restrictions: No      Mobility Bed Mobility Overal bed mobility: Needs Assistance Bed Mobility: Supine to Sit     Supine to sit: HOB elevated, Supervision     General bed mobility comments: Use of rails and increased time.    Transfers Overall transfer level: Needs assistance Equipment used: Rolling walker (2 wheels) Transfers: Sit to/from Stand Sit to Stand: Min assist                  Balance Overall balance assessment: Needs assistance Sitting-balance support: Feet supported, No upper extremity supported Sitting balance-Leahy Scale: Good     Standing balance support: During functional activity, Reliant on assistive device for balance, Bilateral upper extremity supported                               ADL either performed or assessed with clinical judgement   ADL Overall ADL's : Needs assistance/impaired Eating/Feeding: Independent   Grooming: Wash/dry face   Upper Body Bathing: Set up   Lower Body Bathing: Minimal assistance   Upper Body Dressing : Supervision/safety   Lower Body Dressing: Minimal assistance   Toilet Transfer: Min guard   Toileting- Clothing Manipulation and Hygiene: Min guard       Functional mobility during ADLs: Min guard       Vision Baseline Vision/History: 0 No visual deficits Patient Visual Report: No change from baseline       Perception     Praxis  Pertinent Vitals/Pain       Hand Dominance Right   Extremity/Trunk Assessment Upper Extremity Assessment Upper Extremity Assessment: Overall WFL for tasks assessed       Cervical / Trunk Assessment Cervical / Trunk Assessment: Kyphotic   Communication Communication Communication: No difficulties   Cognition Arousal/Alertness: Awake/alert Behavior During Therapy: WFL for tasks assessed/performed Overall Cognitive Status: No  family/caregiver present to determine baseline cognitive functioning                                 General Comments: Appears WFL however pt reports he was mostly bedbound for 3 weeks due to worsening symptoms of fatigue, SOB and swelling in his entire body and did not feel the need to come to the hospital.     General Comments       Exercises     Shoulder Instructions      Home Living Family/patient expects to be discharged to:: Other (Comment)                                        Prior Functioning/Environment Prior Level of Function : Independent/Modified Independent             Mobility Comments: using rollator for ambulation PRN however reports he has been msotly bedbound the last few weeks due to feeling tired, fatigue and increased swelling in his entire body ADLs Comments: independent        OT Problem List: Decreased strength;Decreased activity tolerance;Decreased safety awareness      OT Treatment/Interventions: Self-care/ADL training;Therapeutic exercise;Neuromuscular education;Therapeutic activities;Patient/family education    OT Goals(Current goals can be found in the care plan section) Acute Rehab OT Goals OT Goal Formulation: With patient Time For Goal Achievement: 05/14/22 Potential to Achieve Goals: Good  OT Frequency: Min 2X/week    Co-evaluation              AM-PAC OT "6 Clicks" Daily Activity     Outcome Measure Help from another person eating meals?: None Help from another person taking care of personal grooming?: None Help from another person toileting, which includes using toliet, bedpan, or urinal?: A Little Help from another person bathing (including washing, rinsing, drying)?: A Little Help from another person to put on and taking off regular upper body clothing?: A Little Help from another person to put on and taking off regular lower body clothing?: A Little 6 Click Score: 20   End of Session  Equipment Utilized During Treatment: Rolling walker (2 wheels) Nurse Communication: Mobility status  Activity Tolerance: Patient tolerated treatment well Patient left: in bed;with call bell/phone within reach  OT Visit Diagnosis: Unsteadiness on feet (R26.81);Muscle weakness (generalized) (M62.81)                Time: 5573-2202 OT Time Calculation (min): 11 min Charges:  OT Evaluation $OT Eval Low Complexity: 1 Low  Governor Specking OTR/L  Denice Paradise 04/30/2022, 2:28 PM

## 2022-04-30 NOTE — TOC Progression Note (Signed)
Transition of Care [facility]) - Progression Note    Patient Details  Name: David Huynh MRN: [id number] Date of Birth: [birth year]  [facility] CM/SW Contact  Leone Haven, RN Phone Number: 04/30/2022, 11:34 AM  Clinical Narrative:    NCM spoke with patient at the bedside, he states he does not want SNF and he does not want HH.  He states he will get therapy on site at [facility], this is what he usually does. Will need to call [facility] tomorrow to see what they need in order for him to get therapy onsite there.    Expected Discharge Plan: Skilled Nursing Facility Barriers to Discharge: Continued Medical Work up  Expected Discharge Plan and Services Expected Discharge Plan: Skilled Nursing Facility   Discharge Planning Services: CM Consult Post Acute Care Choice: Skilled Nursing Facility Living arrangements for the past 2 months: Independent Living Facility                                       Social Determinants of Health (SDOH) Interventions    Readmission Risk Interventions    08/20/2020   11:37 AM  Readmission Risk Prevention Plan  Transportation Screening Complete  PCP or Specialist Appt within 3-5 Days Complete  HRI or Home Care Consult Complete  Social Work Consult for Recovery Care Planning/Counseling Complete  Palliative Care Screening Not Applicable  Medication Review [employer]) Complete

## 2022-04-30 NOTE — Progress Notes (Signed)
   04/30/22 1000  Mobility  Activity Transferred from chair to bed  Level of Assistance Moderate assist, patient does 50-74%  Assistive Device Front wheel walker  Distance Ambulated (ft) 2 ft  Activity Response Tolerated well  Mobility Referral Yes  $Mobility charge 1 Mobility   Mobility Specialist Progress Note  Pt requesting to get back in the bed. Had no c/o pain throughout transfer. Left in bed w/ all needs met and call bell in reach.   [place]ious Groves Mobility Specialist  Please contact via SecureChat or Rehab office at [phone]

## 2022-05-01 ENCOUNTER — Other Ambulatory Visit (HOSPITAL_COMMUNITY): Payer: Medicare Other

## 2022-05-01 ENCOUNTER — Ambulatory Visit (HOSPITAL_COMMUNITY): Admit: 2022-05-01 | Payer: Medicare Other | Admitting: Cardiology

## 2022-05-01 ENCOUNTER — Encounter (HOSPITAL_COMMUNITY)
Admission: EM | Disposition: A | Discharge status: Skilled Nursing Facility | Payer: Self-pay | Source: Home / Self Care | Attending: Family Medicine

## 2022-05-01 DIAGNOSIS — I10 Essential (primary) hypertension: Secondary | ICD-10-CM | POA: Diagnosis not present

## 2022-05-01 DIAGNOSIS — I5043 Acute on chronic combined systolic (congestive) and diastolic (congestive) heart failure: Secondary | ICD-10-CM | POA: Diagnosis not present

## 2022-05-01 DIAGNOSIS — I4891 Unspecified atrial fibrillation: Secondary | ICD-10-CM | POA: Diagnosis not present

## 2022-05-01 DIAGNOSIS — I4892 Unspecified atrial flutter: Secondary | ICD-10-CM | POA: Diagnosis not present

## 2022-05-01 DIAGNOSIS — R000 Tachycardia, unspecified: Secondary | ICD-10-CM | POA: Diagnosis not present

## 2022-05-01 DIAGNOSIS — N179 Acute kidney failure, unspecified: Secondary | ICD-10-CM | POA: Diagnosis not present

## 2022-05-01 LAB — CBC
HCT: 40 % (ref 39.0–52.0)
Hemoglobin: 12.7 g/dL — ABNORMAL LOW (ref 13.0–17.0)
MCH: 29.5 pg (ref 26.0–34.0)
MCHC: 31.8 g/dL (ref 30.0–36.0)
MCV: 92.8 fL (ref 80.0–100.0)
Platelets: 317 10*3/uL (ref 150–400)
RBC: 4.31 MIL/uL (ref 4.22–5.81)
RDW: 14.9 % (ref 11.5–15.5)
WBC: 6.4 10*3/uL (ref 4.0–10.5)
nRBC: 0 % (ref 0.0–0.2)

## 2022-05-01 LAB — COOXEMETRY PANEL
Carboxyhemoglobin: 1.7 % — ABNORMAL HIGH (ref 0.5–1.5)
Methemoglobin: <0.7 % (ref 0.0–1.5)
O2 Saturation: 60.4 %
Total hemoglobin: 12.4 g/dL (ref 12.0–16.0)

## 2022-05-01 LAB — GLUCOSE, CAPILLARY
Glucose-Capillary: 109 mg/dL — ABNORMAL HIGH (ref 70–99)
Glucose-Capillary: 149 mg/dL — ABNORMAL HIGH (ref 70–99)
Glucose-Capillary: 121 mg/dL — ABNORMAL HIGH (ref 70–99)
Glucose-Capillary: 132 mg/dL — ABNORMAL HIGH (ref 70–99)

## 2022-05-01 LAB — BASIC METABOLIC PANEL
Anion gap: 10 (ref 5–15)
BUN: 45 mg/dL — ABNORMAL HIGH (ref 8–23)
CO2: 30 mmol/L (ref 22–32)
Calcium: 8.2 mg/dL — ABNORMAL LOW (ref 8.9–10.3)
Chloride: 95 mmol/L — ABNORMAL LOW (ref 98–111)
Creatinine, Ser: 2.46 mg/dL — ABNORMAL HIGH (ref 0.61–1.24)
GFR, Estimated: 26 mL/min — ABNORMAL LOW (ref >60)
Glucose, Bld: 120 mg/dL — ABNORMAL HIGH (ref 70–99)
Potassium: 3.7 mmol/L (ref 3.5–5.1)
Sodium: 135 mmol/L (ref 135–145)

## 2022-05-01 SURGERY — ECHOCARDIOGRAM, TRANSESOPHAGEAL
Anesthesia: Monitor Anesthesia Care

## 2022-05-01 MED ORDER — POTASSIUM CHLORIDE CRYS ER 20 MEQ PO TBCR
40.0000 meq | EXTENDED_RELEASE_TABLET | Freq: Two times a day (BID) | ORAL | Status: DC
  Administered 2022-05-01 (×2): 40 meq via ORAL
  Filled 2022-05-01 (×2): qty 2

## 2022-05-01 MED ORDER — TORSEMIDE 20 MG PO TABS
20.0000 mg | ORAL_TABLET | Freq: Every day | ORAL | Status: DC
  Administered 2022-05-01: 20 mg via ORAL
  Filled 2022-05-01: qty 1

## 2022-05-01 MED ORDER — METOPROLOL SUCCINATE ER 50 MG PO TB24
75.0000 mg | ORAL_TABLET | Freq: Two times a day (BID) | ORAL | Status: DC
  Administered 2022-05-01 – 2022-05-08 (×14): 75 mg via ORAL
  Filled 2022-05-01 (×16): qty 1

## 2022-05-01 NOTE — Progress Notes (Addendum)
Random bladder scan 423cc (not a post-void, pt is lying down sleeping). Pt has spontaneously voided today with roughly 400cc output.   1545: Pt stated he has urge to pee but cannot, upon standing with urinal he voided maybe 50cc. I&O cath'd and obtained 700cc of yellow, cloudy urine.

## 2022-05-01 NOTE — Progress Notes (Signed)
Mobility Specialist Progress Note    05/01/22 1219  Mobility  Activity Ambulated with assistance to bathroom  Level of Assistance Minimal assist, patient does 75% or more  Assistive Device Front wheel walker  Distance Ambulated (ft) 10 ft  Activity Response Tolerated well  Mobility Referral Yes  $Mobility charge 1 Mobility   Pt received in bed. Pt stated something was off with his purewick and he wanted to sit on the toilet. Encouraged pt to pull string when ready to get up.   [place] Mobility Specialist  Please Neurosurgeon or Rehab Office at [phone]

## 2022-05-01 NOTE — Progress Notes (Signed)
[facility] Progress Note   Assessment/ Plan:   AKI on CKD 3b - b/l creat 1.5- 1.7 from November 2022. Creat here 2.5 in setting of decompensated CHF/ anasarca, creat hasn't improved much w/ sig diuresis. UA looks infected w/ wbc/rbc's and large protein. Renal US w/o hydro. Work-up shows sig proteinuria 7.7 gm. Has hx DM2 but w/o extensive diabetic complications. +excess nsaid use recently also. 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 Will send serologies- all pending. Good UOP on IV lasix gtt per cardiology, has been transitioned to PO torsemide. Vol overload/ anasarca - a/c combined CHF and/or nephrotic syndrome/ CKD.  Alb 1.8 here on admission. Diuretics ongoing and is down 10kg from admission w/ sig edema remaining.  Afib / RVR - not amio candidate, on BB and eliquis' Amio - associated pulm fibrosis HTN - meds as above, BP's good Obesity - BMI > 30 Pyuria - will send urine cx, no symptoms, pending    Subjective:    Seen in room.  Weights are coming down.  Has been transitioned to PO torsemide.     Objective:   BP 130/75   Pulse 86   Temp 98.7 F (37.1 C) (Oral)   Resp 20   Ht 6' (1.829 m)   Wt 111.4 kg   SpO2 97%   BMI 33.31 kg/m   Intake/Output Summary (Last 24 hours) at 05/01/2022 1416 Last data filed at 05/01/2022 1000 Gross per 24 hour  Intake 436.12 ml  Output 1750 ml  Net -1313.88 ml   Weight change: -3.7 kg  Physical Exam: Gen:NAD, sitting in chair CVS: RRR Resp: clear bilaterally no c/w/r Abd: soft, nontender, NABS Ext: 2+ LE edema but with improvement  Imaging: DG Chest Port 1 View  Result Date: 04/29/2022 CLINICAL DATA:  Status post thoracentesis EXAM: PORTABLE CHEST 1 VIEW COMPARISON:  Same day chest radiograph FINDINGS: Stable cardiomegaly. Right PICC line remains in place. Significant improved aeration of the lung bases with decreased  pleural effusions status post thoracentesis. No pneumothorax. IMPRESSION: Significant improved aeration of the lung bases with decreased pleural effusions status post thoracentesis. No pneumothorax. Electronically Signed   By: Davina Poke D.O.   On: 04/29/2022 16:29    Labs: BMET Recent Labs  Lab 04/26/22 0100 04/27/22 0000 04/28/22 0540 04/29/22 0505 04/30/22 0445 05/01/22 0224  NA 138 141 138 139 138 135  K 4.1 4.4 3.9 3.9 3.5 3.7  CL 111 110 104 100 99 95*  CO2 19* 24 27 29 31 30   GLUCOSE 90 109* 140* 115* 103* 120*  BUN 42* 42* 45* 43* 44* 45*  CREATININE 2.48* 2.50* 2.48* 2.52* 2.35* 2.46*  CALCIUM 8.2* 8.3* 7.8* 8.1* 7.9* 8.2*   CBC Recent Labs  Lab 04/28/22 0540 04/29/22 0505 04/30/22 0445 05/01/22 0224  WBC 8.8 9.6 7.7 6.4  HGB 12.2* 12.6* 13.0 12.7*  HCT 38.3* 39.7 39.5 40.0  MCV 94.8 94.7 91.2 92.8  PLT 302 302 327 317    Medications:     apixaban  2.5 mg Oral BID   Chlorhexidine Gluconate Cloth  6 each Topical Daily   feeding supplement  237 mL Oral BID BM   hydrALAZINE  12.5 mg Oral Q8H   insulin aspart  0-15 Units Subcutaneous TID WC   insulin aspart  0-5 Units Subcutaneous QHS   isosorbide mononitrate  15 mg Oral Daily   metoprolol succinate  75 mg Oral BID  multivitamin with minerals  1 tablet Oral Daily   potassium chloride  40 mEq Oral BID   rosuvastatin  40 mg Oral Daily   sodium chloride flush  10-40 mL Intracatheter Q12H   sodium chloride flush  3 mL Intravenous Q12H   torsemide  20 mg Oral Daily    Bufford Buttner, MD 05/01/2022, 2:16 PM

## 2022-05-01 NOTE — Progress Notes (Signed)
Orthopedic Tech Progress Note Patient Details:  David Huynh 16-Oct-1940 [id number]  Ortho Devices Type of Ortho Device: Ace wrap, Unna boot Ortho Device/Splint Location: BLE Ortho Device/Splint Interventions: Ordered, Application, Adjustment   Post Interventions Patient Tolerated: Well Instructions Provided: Care of device  [name] 05/01/2022, 5:33 PM

## 2022-05-01 NOTE — Progress Notes (Cosign Needed)
Rounding Note    Patient Name: David Huynh Date of Encounter: 05/01/2022  [facility] Cardiologist: Loralie Champagne, MD   Subjective   Feeling so much better.  Would not necessarily want to pursue a procedure while here, hopes to go home soon.  Inpatient Medications    Scheduled Meds:  apixaban  2.5 mg Oral BID   Chlorhexidine Gluconate Cloth  6 each Topical Daily   feeding supplement  237 mL Oral BID BM   hydrALAZINE  12.5 mg Oral Q8H   insulin aspart  0-15 Units Subcutaneous TID WC   insulin aspart  0-5 Units Subcutaneous QHS   isosorbide mononitrate  15 mg Oral Daily   metoprolol succinate  75 mg Oral BID   multivitamin with minerals  1 tablet Oral Daily   potassium chloride  40 mEq Oral BID   rosuvastatin  40 mg Oral Daily   sodium chloride flush  10-40 mL Intracatheter Q12H   sodium chloride flush  3 mL Intravenous Q12H   torsemide  20 mg Oral Daily   Continuous Infusions:  sodium chloride Stopped (05/01/22 0806)   sodium chloride     PRN Meds: sodium chloride, acetaminophen, hydrALAZINE, labetalol, levalbuterol, ondansetron (ZOFRAN) IV, sodium chloride flush, sodium chloride flush   Vital Signs    Vitals:   04/30/22 1957 04/30/22 2352 05/01/22 0415 05/01/22 0800  BP: 109/76 131/69 124/67   Pulse: 98 86 86   Resp: _0 Temp: 97.6 F (36.4 C) 97.7 F (36.5 C) 97.7 F (36.5 C)   TempSrc: Oral Oral Oral   SpO2: 94% 93% 100% 97%  Weight:   111.4 kg   Height:        Intake/Output Summary (Last 24 hours) at 05/01/2022 0955 Last data filed at 05/01/2022 0100 Gross per 24 hour  Intake 697.71 ml  Output 1850 ml  Net -1152.29 ml      05/01/2022    4:15 AM 04/30/2022    3:54 AM 04/29/2022    4:10 AM  Last 3 Weights  Weight (lbs) 245 lb 9.5 oz 253 lb 12 oz 263 lb 3.7 oz  Weight (kg) 111.4 kg 115.1 kg 119.4 kg      Telemetry    SR, PATs, 80's-90's -120's-130's  - Personally Reviewed  ECG    ST 125bpm, PAC - Personally  Reviewed  Physical Exam   GEN: No acute distress.   Neck: No JVD Cardiac: RRR, tachycardic, no murmurs, rubs, or gallops.  Respiratory: diminished at the bases, ery soft crackles. GI: Soft, nontender, non-distended  MS: 1+ edema LLE, less on the right, No deformity. Neuro:  Nonfocal  Psych: Normal affect   Labs    High Sensitivity Troponin:   Recent Labs  Lab 04/26/22 0100 04/26/22 0306  TROPONINIHS 84* 98*     Chemistry Recent Labs  Lab 04/26/22 0306 04/27/22 0000 04/27/22 1448 04/28/22 0540 04/29/22 0505 04/30/22 0445 05/01/22 0224  NA  --    < >  --    < > 139 138 135  K  --    < >  --    < > 3.9 3.5 3.7  CL  --    < >  --    < > 100 99 95*  CO2  --    < >  --    < > _1 GLUCOSE  --    < >  --    < > 115* 103*  120*  BUN  --    < >  --    < > 43* 44* 45*  CREATININE  --    < >  --    < > 2.52* 2.35* 2.46*  CALCIUM  --    < >  --    < > 8.1* 7.9* 8.2*  MG  --   --   --   --  2.0  --   --   PROT 5.7*  --  4.5*  --   --   --   --   ALBUMIN 1.8*  --   --   --   --   --   --   AST 16  --   --   --   --   --   --   ALT 10  --   --   --   --   --   --   ALKPHOS 63  --   --   --   --   --   --   BILITOT 0.7  --   --   --   --   --   --   GFRNONAA  --    < >  --    < > 25* 27* 26*  ANIONGAP  --    < >  --    < > _0 < > = values in this interval not displayed.    Lipids No results for input(s): "CHOL", "TRIG", "HDL", "LABVLDL", "LDLCALC", "CHOLHDL" in the last 168 hours.  Hematology Recent Labs  Lab 04/29/22 0505 04/30/22 0445 05/01/22 0224  WBC 9.6 7.7 6.4  RBC 4.19* 4.33 4.31  HGB 12.6* 13.0 12.7*  HCT 39.7 39.5 40.0  MCV 94.7 91.2 92.8  MCH 30.1 30.0 29.5  MCHC 31.7 32.9 31.8  RDW 15.4 14.9 14.9  PLT 302 327 317   Thyroid No results for input(s): "TSH", "FREET4" in the last 168 hours.  BNP Recent Labs  Lab 04/26/22 0100  BNP 1,368.4*    DDimer No results for input(s): "DDIMER" in the last 168 hours.   Radiology    DG Chest Port 1  View  Result Date: 04/29/2022 CLINICAL DATA:  Status post thoracentesis EXAM: PORTABLE CHEST 1 VIEW COMPARISON:  Same day chest radiograph FINDINGS: Stable cardiomegaly. Right PICC line remains in place. Significant improved aeration of the lung bases with decreased pleural effusions status post thoracentesis. No pneumothorax. IMPRESSION: Significant improved aeration of the lung bases with decreased pleural effusions status post thoracentesis. No pneumothorax. Electronically Signed   By: Davina Poke D.O.   On: 04/29/2022 16:29    Cardiac Studies   04/26/22; TTE 1. Vmax 2.5 m/s, MG 17 mmHG, AVA 1.50 cm2, DI 0.34. Suspect gradients  lower than expected due to reduced EF. Based on AVA and DI, AS is  moderate. The aortic valve is tricuspid. There is severe calcifcation of  the aortic valve. There is severe thickening   of the aortic valve. Aortic valve regurgitation is not visualized.  Moderate aortic valve stenosis. Aortic valve area, by VTI measures 1.50  cm. Aortic valve mean gradient measures 17.0 mmHg. Aortic valve Vmax  measures 2.45 m/s.   2. Left ventricular ejection fraction, by estimation, is 40 to 45%. The  left ventricle has mildly decreased function. The left ventricle  demonstrates global hypokinesis. Left ventricular diastolic function could  not be evaluated.   3. Right ventricular systolic function is mildly  reduced. The right  ventricular size is mildly enlarged. Tricuspid regurgitation signal is  inadequate for assessing PA pressure.   4. Left atrial size was mildly dilated.   5. Moderate pleural effusion in the left lateral region.   6. The mitral valve is grossly normal. Mild mitral valve regurgitation.  No evidence of mitral stenosis.   7. The inferior vena cava is dilated in size with <50% respiratory  variability, suggesting right atrial pressure of 15 mmHg.   Comparison(s): Changes from prior study are noted. EF 40-45% on this  study. In Afib with RVR.  Suspect moderate AS.    03/02/2020: R/LHC RA mean 2 RV 33/3 PA 29/4, mean 17 PCWP mean 6 LV 164/8 AO 148/74  Mean aortic valve gradient 14.6 mmHg, peak-to-peak 17 mmHg.   Oxygen saturations: PA 71% AO 98%  Cardiac Output (Fick) 6.59  Cardiac Index (Fick) 2.66   Left Main  No significant disease.    Left Anterior Descending  80% proximal LAD stenosis just distal to small D1 and at site of take-off of moderate septal perforator. Long up to 70% prox/mid LAD stenosis distal to D2. 50% stenosis mid/distal LAD.    Left Circumflex  Patent proximal LCx stent. Large OM2 branches early. The superior branch has 80% ostial stenosis, inferior branch with 60% ostial stenosis.    Right Coronary Artery  Mid RCA stent patent. 40% stenosis proximal PDA, 50% stenosis mid PDA.      10/26/2020: EPS/Ablation CONCLUSIONS: 1. Atrial fibrillation upon presentation.   2. Intracardiac echo reveals a moderate to large sized left atrium. 3. Successful electrical isolation and anatomical encircling of all four pulmonary veins with radiofrequency current. 4. Additional mapping and ablation within the left atrium due to persistence of atrial fibrillation with a posterior wall box demonstrated  5. Atrial fibrillation successfully cardioverted to sinus rhythm. 6. Cavo-tricuspid isthmus ablation performed with complete bidirectional isthmus block achieved 7. No inducible arrhythmias following ablation 8. No early apparent complications. Patient Profile     81 y.o. male CAD (PCI to RCA 2017, LAD 2979), chronic systolic CHF (with waxing/waning EF's over the years), HTN, HLD, DM, CKD 3, AFib, and asthma   Amiodarone started March 2022 for AFib RVR > developed acute amiodarone lung toxicity with elevated ESR and patchy bilateral infiltrates. And stopped  Out patient follow up is hit/miss with periods of getting lost to follow up  October 26, 2020 PVI/CTI ablation (Dr. Rayann Heman)  Admitted with 04/26/22 with  3 weeks of progressive SOB, edema found in acute/chronic CHF  EP asked on board for AFib/RVR, saw by Jonni Sanger and Dr. Myles Gip 12/13,, not felt a tikosyn candidate with renal disease, was markedly volume OL and not felt to have any AAD options not procedural candidate.  12/14/ LEFT thora 1.5L 12/16 thora 1120m LEFT and [id number] the RIGHT  Assessment & Plan    AFib AFlutter CHA2DS2Vasc is 6, on Eliquis (dose reduced for renal function/age) S/p PVI/CTI ablation June 2022 w/Dr. AlRayann HemanI think he is having PATs  Agree no good AAD option for him Toprol has been up-titrated a couple times, including today to 7555mID, amy need more   Acute/chronic CHF exacerbation  ICM D/w Dr. McLAundra Dubiniuresed 30lbs Neg -12,744m43mp b/l thoras as well (transudates) Volume status is significantly  improved  AKI/CKD (III) Creat 2.46 (baseline in the 1.4-1.5  range probably) Nephrology brought on board yesterday discussed  "Proteinuria/ renal failure could be a diabetic nephropathy, post-infectious GN related to recent  dental infection, nsaid-related vs other "  "a/c combined CHF and/or nephrotic syndrome/ CKD"  C/w HF and nephrology teams  CAD Not an ACS C/w HF team  7.  VHD w/AS moderate  For questions or updates, please contact [facility] Please consult [email or web address] for contact info under        Signed, Baldwin Jamaica, PA-C  05/01/2022, 9:55 AM

## 2022-05-01 NOTE — Progress Notes (Signed)
Progress Note   Patient: David Huynh WCH:[id number] DOB: Aug 10, 1940 DOA: 04/26/2022     4 DOS: the patient was seen and examined on 05/01/2022 at 11:30AM      Brief hospital course: David Huynh is an 81 y.o. M with sdCHF EF 45-50%, pAF on Eliquis, obesity, DM, CKD IIIb, HTN and hx amio lung fibrosis who presented with progressive SOB and edema.  Wife reported poor compliance with meds.    12/13: Admitted in respiratory distress on BiPAP, Lasix started; Cardiology consulted 12/14: Still on BiPAP; Pulmonology consulted; underwent thoracentesis 12/15: Breathing improved 12/16: Repeat thoracentesis 12/17: Nephrology consulted 12/18: Transitioned to oral diuretics     Assessment and Plan: * Acute on chronic combined systolic and diastolic CHF (congestive heart failure) (HCC) Presented with leg swelling, dyspnea on exertion, CXR showing bilateral opacities and pleural effusions.  Weight down 4kg, neg only 1.4L.  Cr slightly worse, K 3.7  - Diuretics per cardiology - Entresto, MRA per cardiology - Continue hydralazine, Imdur, metop - D/c with Unna boots    Atrial fibrillation with RVR (HCC) In sinus while I am in room.  TEE canceled. Amiodarone contraindicated.   - Continue apixaban, metoprolol per Cardiology   Nephrotic syndrome Patient was taking Aleve daily for several weeks in early October prior to onset of his swelling and congestive heart failure.  Unclear the time course of his renal failure, and if this high-grade proteinuria is simply progression of his diabetic nephropathy, or primary nephrotic syndrome. - Consult nephrology, appreciate expertise - Unna boots  Acute-on-chronic kidney injury (HCC) Chronic kidney disease stage IIIa baseline 1.6, was 2.5 mg/dL on admission, no improvement with decongestion.  Agree with Pulmonology, ?nephrotic syndrome. - Outpateint nephrology follow up  Hypoalbuminemia - Ensure  Amidorone associated Pulmonary fibrosis  [facility]) Admitted Apr 2022 with bilateral infiltrates and found to have amio lung toxicity, completed prolonged prednisone taper and was improving, but CT 9/22 still showed amio associated lung fibrosis.  Doubt this is primary driver at this time.  Bilateral pleural effusion 1.5L taken off left lung on 12/14.  Transudate.  Breathing immediately better after.  CXR 12/16 showed large effusions again, repeat bilateral thoracentesis of 2.7L more fluid. - Repeat CXR in 2 weeks  Type 2 diabetes mellitus with stage 3a chronic kidney disease, with long-term current use of insulin (HCC) Glucoses controlled - Continue SS corrections  Acute respiratory failure with hypoxia (HCC) Presented with tachynea, respiratory distress and hypoxia requiring BiPAP.  Due to fluid overload.  Procal negative, doubt pneumonia.  Doubt pulmonary fibrosis is contributing.  Improved to 3L today.  Aortic stenosis, mild - Defer to Cardiology  Cardiomyopathy, ischemic      Essential hypertension BP controlled - Continue hydralazine, Imdur, metoprolol, torse  Mild persistent asthma No wheezing - PRN levalbuterol  Hyperlipidemia with target LDL less than 70 - Continue Crestor  Obesity BMI >30          Subjective: No new complaints.     Physical Exam: BP 117/69 (BP Location: Left Arm)   Pulse 92   Temp 98.7 F (37.1 C) (Oral)   Resp 20   Ht 6' (1.829 m)   Wt 111.4 kg   SpO2 96%   BMI 33.31 kg/m   Elderly adult male, lying in bed, no acute distress RRR no murmurs still pitting LE edema, JVP elevated RR normal, lungs clear but diminished overall Abdomen soft without tenderness to palpation Attention normal, affect normal, face symmetric, speech fluent  Data Reviewed: BMP  shows Cr slightly up   Family Communication: Wife by the phone    Disposition: Status is: Inpatient         Author: Alberteen Sam, MD 05/01/2022 4:46 PM  For on call review [email or web address].

## 2022-05-01 NOTE — Progress Notes (Signed)
Daily Progress Note   Patient Name: David Huynh       Date: 05/01/2022 DOB: 1941/01/31  Age: 81 y.o. MRN#: [id number] Attending Physician: Edwin Dada, * Primary Care Physician: [name], MD Admit Date: 04/26/2022  Reason for Consultation/Follow-up: Severe combined lung and heart disease prognosis worsening   Patient Profile/HPI:    81 y.o. 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  Initially, he required BiPAP.  Chest x-ray showed pulmonary edema. His creatinine is around 2.48.  He has albumin of 1.8.  BNP was elevated.  He has been in A-fib causing this CHF exacerbation he is on a Lasix drip.  Having some A-fib with RVR which is induced cardiomyopathy-unfortunately no good options for rhythm control.  He underwent thoracentesis yesterday and had 1500 cc off.  He reports great improvement in his breathing that was provided immediate only with the thoracentesis.  He is diuresing.  Palliative consulted for above.   Subjective: Chart reviewed including labs, progress notes, imaging from this and previous encounters.  He required repeat thoracentesis over the weekend.  Nephrology consulted for nephrotic syndrome- possible UTI- urine culture is pending.  David Huynh is awake alert and oriented. States he feels well. He forgot to get his advance directives. Ambulated with PT over weekend with fatigue- SNF recommended but patient declines.  No questions or concerns. Requesting assistance to the bathroom.    Physical Exam Vitals and nursing note reviewed.  Constitutional:       Appearance: Normal appearance.  Pulmonary:     Effort: Pulmonary effort is normal.  Skin:    General: Skin is warm and dry.  Neurological:     Mental Status: He is alert and oriented to person, place, and time.             Vital Signs: BP (!) 134/90   Pulse 86   Temp 98.7 F (37.1 C) (Oral)   Resp 20   Ht 6' (1.829 m)   Wt 111.4 kg   SpO2 97%   BMI 33.31 kg/m  SpO2: SpO2: 97 % O2 Device: O2 Device: Room Air O2 Flow Rate: O2 Flow  Rate (L/min): 2 L/min  Intake/output summary:  Intake/Output Summary (Last 24 hours) at 05/01/2022 1051 Last data filed at 05/01/2022 1000 Gross per 24 hour  Intake 697.71 ml  Output 2050 ml  Net -1352.29 ml   LBM: Last BM Date : 04/30/22 Baseline Weight: Weight: 124.3 kg Most recent weight: Weight: 111.4 kg       Palliative Assessment/Data: PPS: 50%        Patient Active Problem List   Diagnosis Date Noted   Nephrotic syndrome 04/30/2022   Hypoalbuminemia 04/28/2022   Bilateral pleural effusion 04/27/2022   Amidorone associated Pulmonary fibrosis (HCC) 04/27/2022   Atrial fibrillation with RVR (HCC) 04/26/2022   Type 2 diabetes mellitus with stage 3a chronic kidney disease, with long-term current use of insulin (HCC) 09/22/2021   Secondary hypercoagulable state (HCC) 11/23/2020   Hyperkalemia 09/05/2020   Diastolic CHF (HCC) 08/18/2020   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   Syncope and collapse 08/09/2020   Acute-on-chronic kidney injury (HCC) 08/09/2020   Type 2 diabetes mellitus with diabetic polyneuropathy, without long-term current use of insulin (HCC) 02/24/2020   Acute on chronic combined systolic (congestive) and diastolic (congestive) heart failure (HCC) 12/06/2019   Acute respiratory failure with hypoxia (HCC) 12/06/2019   Persistent atrial fibrillation (HCC) 03/25/2018   CAD in native artery    Aortic stenosis, mild 06/28/2016   Cardiomyopathy, ischemic 02/17/2016   Acute on chronic combined systolic and  diastolic CHF (congestive heart failure) (HCC) 02/17/2016   Dyspnea    Hypertensive heart disease    Hypocalcemia 02/12/2015   Fracture of multiple ribs 02/12/2015   Other specified forms of effusion, except tuberculous    Essential hypertension    Other and unspecified hyperlipidemia    Liver function study, abnormal 04/26/2011   Obesity    Hyperlipidemia with target LDL less than 70    Mild persistent asthma    Allergic state     Palliative Care Assessment & Plan    Assessment/Recommendations/Plan  Continue current plan of care Advance care planning- DNR with otherwise full scope measures- he has Advance Directives- requested copy for his chart- discussed it is important to share his wishes with his medical providers   Code Status: DNR  Prognosis:  Unable to determine  Discharge Planning: Home- [facility]   Thank you for allowing the Palliative Medicine Team to assist in the care of this patient.     Greater than 50%  of this time was spent counseling and coordinating care related to the above assessment and plan.  Ocie Bob, AGNP-C Palliative Medicine   Please contact Palliative Medicine Team phone at [phone] for questions and concerns.

## 2022-05-01 NOTE — Progress Notes (Signed)
Patient ID: David Huynh, male   DOB: [birth year], 81 y.o.   MRN: [id number]     Advanced Heart Failure Rounding Note  PCP-Cardiologist: [name], MD   Subjective:    Good diuresis again yesterday on Lasix gtt, weight down 8 lbs.  Complains of fullness/pain in bladder area. Co-ox 60%, CVP 2-3. Creatinine mildly higher at 2.46.   Patient is in and out of atypical atrial flutter with RVR.  Currently in NSR.   Objective:   Weight Range: 111.4 kg Body mass index is 33.31 kg/m.   Vital Signs:   Temp:  [97.5 F (36.4 C)-97.9 F (36.6 C)] 97.7 F (36.5 C) (12/18 0415) Pulse Rate:  [86-98] 86 (12/18 0415) Resp:  [18-20] 20 (12/18 0415) BP: (90-131)/(59-91) 124/67 (12/18 0415) SpO2:  [93 %-100 %] 100 % (12/18 0415) Weight:  [111.4 kg] 111.4 kg (12/18 0415) Last BM Date : 04/30/22  Weight change: Filed Weights   04/29/22 0410 04/30/22 0354 05/01/22 0415  Weight: 119.4 kg 115.1 kg 111.4 kg    Intake/Output:   Intake/Output Summary (Last 24 hours) at 05/01/2022 0804 Last data filed at 05/01/2022 0100 Gross per 24 hour  Intake 697.71 ml  Output 2500 ml  Net -1802.29 ml      Physical Exam    General: NAD Neck: No JVD, no thyromegaly or thyroid nodule.  Lungs: Clear to auscultation bilaterally with normal respiratory effort. CV: Nondisplaced PMI.  Heart regular S1/S2, no S3/S4, no murmur. 1+ edema 1/2 to knees bilaterally.   Abdomen: Soft, nontender, no hepatosplenomegaly, no distention.  Skin: Intact without lesions or rashes.  Neurologic: Alert and oriented x 3.  Psych: Normal affect. Extremities: No clubbing or cyanosis.  HEENT: Normal.   Telemetry   NSR alternating with atypical atrial flutter rate 120s (personally reviewed).   EKG    N/A   Labs    CBC Recent Labs    04/30/22 0445 05/01/22 0224  WBC 7.7 6.4  HGB 13.0 12.7*  HCT 39.5 40.0  MCV 91.2 92.8  PLT 327 317   Basic Metabolic Panel Recent Labs    21/22/48 0505 04/30/22 0445  05/01/22 0224  NA 139 138 135  K 3.9 3.5 3.7  CL 100 99 95*  CO2 29 31 30   GLUCOSE 115* 103* 120*  BUN 43* 44* 45*  CREATININE 2.52* 2.35* 2.46*  CALCIUM 8.1* 7.9* 8.2*  MG 2.0  --   --    Liver Function Tests No results for input(s): "AST", "ALT", "ALKPHOS", "BILITOT", "PROT", "ALBUMIN" in the last 72 hours. No results for input(s): "LIPASE", "AMYLASE" in the last 72 hours. Cardiac Enzymes No results for input(s): "CKTOTAL", "CKMB", "CKMBINDEX", "TROPONINI" in the last 72 hours.  BNP: BNP (last 3 results) Recent Labs    04/26/22 0100  BNP 1,368.4*    ProBNP (last 3 results) No results for input(s): "PROBNP" in the last 8760 hours.   D-Dimer No results for input(s): "DDIMER" in the last 72 hours. Hemoglobin A1C No results for input(s): "HGBA1C" in the last 72 hours.  Fasting Lipid Panel No results for input(s): "CHOL", "HDL", "LDLCALC", "TRIG", "CHOLHDL", "LDLDIRECT" in the last 72 hours. Thyroid Function Tests No results for input(s): "TSH", "T4TOTAL", "T3FREE", "THYROIDAB" in the last 72 hours.  Invalid input(s): "FREET3"  Other results:   Imaging    No results found.   Medications:     Scheduled Medications:  apixaban  2.5 mg Oral BID   Chlorhexidine Gluconate Cloth  6 each Topical Daily  feeding supplement  237 mL Oral BID BM   hydrALAZINE  12.5 mg Oral Q8H   insulin aspart  0-15 Units Subcutaneous TID WC   insulin aspart  0-5 Units Subcutaneous QHS   isosorbide mononitrate  15 mg Oral Daily   metoprolol succinate  75 mg Oral BID   multivitamin with minerals  1 tablet Oral Daily   potassium chloride  20 mEq Oral BID   rosuvastatin  40 mg Oral Daily   sodium chloride flush  10-40 mL Intracatheter Q12H   sodium chloride flush  3 mL Intravenous Q12H   torsemide  20 mg Oral Daily    Infusions:  sodium chloride 10 mL/hr at 05/01/22 0559   sodium chloride      PRN Medications: sodium chloride, acetaminophen, hydrALAZINE, labetalol,  levalbuterol, ondansetron (ZOFRAN) IV, sodium chloride flush, sodium chloride flush    Patient Profile   81 y.o. male with a history of CAD, chronic systolic CHF, HTN, HLD, DM, CKD 3, asthma, and PAF w/ h/o amiodarone lung toxicity, admitted w/ a/c CHF and recurrent AF w/ RVR.   Assessment/Plan   1. Acute on chronic CHF: Prior ischemic cardiomyopathy, improved after PCI in 2017.  Echo 03/2018 with EF back down to 35-40% and moderate-severe RV dysfunction. This was in the setting of atrial fibrillation with RVR. EF 40% on TEE 04/2018. Echo in 3/20 showed EF up to 50%.  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  Echo in 7/21 with EF 25-30%, severe RV dysfunction.  TEE in 10/21, however, showed EF up to 50%.  He had PCI to LAD in 10/21. Echo in 3/22 showed stable EF 50-55% with normal RV.   Echo in 8/22 with EF 45-50%, moderate LVH, normal RV, moderate AS.  He returns with marked volume overload in setting of recurrent AF with RVR.  No evidence for ACS, suspect exacerbation may be due to AF/RVR. He has been lost to follow-up. Echo this admit EF 40-45%, RV mildly reduced, moderate AS. PICC placed. Initially with marked peripheral edema and oxygen requirement. Excellent UOP with lasix gtt at 15/hr and metolazone.  Weight is down about 30 lbs.  CVP is 2-3 today with co-ox 60%, creatinine stable at 2.46.  Suspect residual peripheral edema is primarily due to nephrotic syndrome.  - Stop IV Lasix, start torsemide 20 mg daily for now.  - Hydralazine/Imdur for afterload reduction with elevated creatinine and high BP.   - If creatinine stabilizes, add SGLT2 inhibitor.  2. Atrial fibrillation/flutter: As above, concerned for possible tachy-mediated CMP.  Had successful DC-CV on 03/29/18 but then back in atrial fibrillation. Ranolazine added, but failed DCCV again 04/02/18. DCCV 04/19/18 was successful.  AF with RVR at 3/22 admission, amiodarone restarted and he was cardioverted to  NSR, but he developed amiodarone lung toxicity.  Amiodarone was stopped. Now s/p atrial fibrillation ablation in 6/22.  He is back in atypical atrial flutter with RVR, not sure how long. Suspect this triggered recurrent CHF.  He does not appear to still be on ranolazine at home. Over the weekend, he constantly alternated between NSR and atypical flutter with rate in 120s.  Currently NSR.  - No amiodarone with history of lung toxicity, anti-arrhythmic options limited as he also has AKI on CKD stage 3.    - Not Tikosyn candidate with CKD.  - Continue Eliquis 2.5 mg bid (of note, has missed doses recently).  - Cancel DCCV today as he is alternating between NSR  and atypical flutter.  - Increase Toprol XL to 75 mg bid to try to rate control (on Coreg at home).  - Will ask EP to take a look at him again now that he is fully diuresed.  ?Candidate for atypical flutter ablation as we do not have a rhythm control agent we can use.   3. AKI on CKD stage 3: Creatinine baseline in past (1 year ago) was around 1.6.   Creatinine today is 2.46, has been fairly stable this admission. Patient has nephrotic syndrome with 7.7 g/day proteinuria and low albumen.  Suspect this is from diabetic nephropathy though he also uses NSAIDs frequently.  - Hold Entresto, spironolactone (not totally clear that he was taking at home).  - Nephrology following, will need to see them in followup.  - Fullness/pain pelvic area, will need bladder scan this morning (?urinary retention).  4. CAD: History of DES to RCA in 9/17.  Underwent LHC 04/03/18 with PCI to prox left circ. LHC in 10/21 with PCI to LAD.  No chest pain this admission.  Mild elevation in TnI but no trend, doubt ACS.  - He is on Eliquis, so no ASA.   - Continue statin.  - Defer cath with elevated creatinine and no ACS.  5. Aortic stenosis: Moderate on 8/22 echo. Moderate on echo this admit, AVA 1.50 cm2, DI 0.34, mean gradient 17 mmHG.  6. 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  He improved with steroids in the past.  7. Pleural effusion: Thoracentesis x 2 on left and x 1 on right this admission.  Transudates, due to CHF/nephrotic syndrome.   [name] 05/01/2022 8:04 AM

## 2022-05-01 NOTE — Progress Notes (Signed)
Physical Therapy Treatment Patient Details Name: David Huynh MRN: [id number] DOB: Sep 19, 1940 Today's Date: 05/01/2022   History of Present Illness Patient is a 81 y/o male who presents on 12/13 with SOB for 1 month and edema in BLEs. Found to have A-fib with RVR, AKI on CKD and acute on chronic CHF exacerbation. s/p thoracentesis 12/14. PMH includes COVID 19, A-fib, CAD, CHF, CKD III, DM, HTN, obesity, chronic lung inflammation.    PT Comments    Pt was seen for mobility on RW with help to get to chair, but noted twice that pt asked to sit and was unsafe despite warnings about the sequence to get set up properly.  Tends to try to sit very quickly and was dismissive about the discussion about dc planning with PT and case manager.  Pt will be seen for therapy prior to dc with SNF recommended due to length of time he has been off his feet and need for more time to recover.   Also wife is unable to physically assist him currently.  Recommend he not go directly home and have found agreement with the team.  Follow up for goals of acute PT as are outlined below.  Recommendations for follow up therapy are one component of a multi-disciplinary discharge planning process, led by the attending physician.  Recommendations may be updated based on patient status, additional functional criteria and insurance authorization.  Follow Up Recommendations  Skilled nursing-short term rehab (<3 hours/day) Can patient physically be transported by private vehicle: No   Assistance Recommended at Discharge Frequent or constant Supervision/Assistance  Patient can return home with the following A lot of help with walking and/or transfers;A little help with bathing/dressing/bathroom;Assistance with cooking/housework;Direct supervision/assist for medications management;Direct supervision/assist for financial management;Assist for transportation;Help with stairs or ramp for entrance   Equipment Recommendations  None  recommended by PT    Recommendations for Other Services       Precautions / Restrictions Precautions Precautions: Fall;Other (comment) Precaution Comments: watch 02, urine incontinence Restrictions Weight Bearing Restrictions: No Other Position/Activity Restrictions: PICC line restrictions     Mobility  Bed Mobility               General bed mobility comments: up in BR when PT arrived    Transfers Overall transfer level: Needs assistance Equipment used: Rolling walker (2 wheels), 1 person hand held assist Transfers: Sit to/from Stand Sit to Stand: Mod assist           General transfer comment: mod to power up from bed where pt first sat and then mod to get up from BR    Ambulation/Gait Ambulation/Gait assistance: Min assist, Mod assist Gait Distance (Feet): 35 Feet (10 + 15) Assistive device: Rolling walker (2 wheels) Gait Pattern/deviations: Step-to pattern, Step-through pattern, Decreased stride length, Knees buckling, Wide base of support, Trunk flexed Gait velocity: decreased Gait velocity interpretation: <1.8 ft/sec, indicate of risk for recurrent falls Pre-gait activities: standing postural check General Gait Details: Even with RW was quite weak coming from BR and asked to sit on EOB, but barely turned and sat on bed with impulsive quick effort without awareness that he was too close to edge   Stairs             Wheelchair Mobility    Modified Rankin (Stroke Patients Only)       Balance Overall balance assessment: Needs assistance Sitting-balance support: Feet supported Sitting balance-Leahy Scale: Good     Standing balance support: Bilateral upper  extremity supported, During functional activity Standing balance-Leahy Scale: Poor Standing balance comment: very poor spatial awareness of chair vs his location, urine incontinence when up having just come from BR                            Cognition Arousal/Alertness:  Awake/alert Behavior During Therapy: Impulsive Overall Cognitive Status: No family/caregiver present to determine baseline cognitive functioning                                 General Comments: pt did not acknowlege that sitting on edge of a surface is not safe        Exercises      General Comments General comments (skin integrity, edema, etc.): pt is up to walk with help but not safe in his choices of use of walker, continually catching the wheels and frequently needing support to move around obstacles      Pertinent Vitals/Pain Pain Assessment Pain Assessment: No/denies pain    Home Living                          Prior Function            PT Goals (current goals can now be found in the care plan section) Acute Rehab PT Goals Patient Stated Goal: to go home Progress towards PT goals: Progressing toward goals    Frequency    Min 3X/week      PT Plan Current plan remains appropriate    Co-evaluation              AM-PAC PT "6 Clicks" Mobility   Outcome Measure  Help needed turning from your back to your side while in a flat bed without using bedrails?: A Little Help needed moving from lying on your back to sitting on the side of a flat bed without using bedrails?: A Little Help needed moving to and from a bed to a chair (including a wheelchair)?: A Little Help needed standing up from a chair using your arms (e.g., wheelchair or bedside chair)?: A Little Help needed to walk in hospital room?: Total Help needed climbing 3-5 steps with a railing? : Total 6 Click Score: 14    End of Session Equipment Utilized During Treatment: Gait belt;Oxygen Activity Tolerance: Patient limited by fatigue Patient left: in chair;with call bell/phone within reach Nurse Communication: Mobility status;Other (comment) (discussion of safety with walking with MD) PT Visit Diagnosis: Muscle weakness (generalized) (M62.81);Difficulty in walking, not  elsewhere classified (R26.2);Unsteadiness on feet (R26.81)     Time: 1212-1232 PT Time Calculation (min) (ACUTE ONLY): 20 min  Charges:  $Gait Training: 8-22 mins     Ramond Dial 05/01/2022, 12:55 PM  Mee Hives, PT PhD Acute Rehab Dept. Number: [place] [facility] [place]

## 2022-05-01 NOTE — Care Management Important Message (Signed)
Important Message  Patient Details  Name: David Huynh MRN: [id number] Date of Birth: 07/19/1940   Medicare Important Message Given:  Yes     Renie Ora 05/01/2022, 12:31 PM

## 2022-05-01 NOTE — Plan of Care (Signed)
  Problem: Clinical Measurements: Goal: Respiratory complications will improve Outcome: Progressing   Problem: Activity: Goal: Risk for activity intolerance will decrease Outcome: Progressing   

## 2022-05-01 NOTE — TOC Progression Note (Addendum)
Transition of Care [facility]) - Progression Note    Patient Details  Name: David Huynh MRN: [id number] Date of Birth: 1940/05/31  [facility] CM/SW Contact  Elliot Cousin, RN Phone Number: [phone]  05/01/2022, 12:04 PM  Clinical Narrative:     HF TOC CM contacted wife and left message for pt's wife, [name] for return call. Contacted [employer], spoke to [place]. States she will have Copy, [name] call me back in charge of Home Care. States she can transition him at anytime. They use [facility] at their facility for [facility]. Wife states he will need RW with seat, his DME is broken. CM/PT spoke to pt and he is adamant about going home. Attending updated. Pt has a Risk manager. Will arrange PTAR transport home.   [place] RN will go out the day after dc to evaluate.   Expected Discharge Plan: Skilled Nursing Facility Barriers to Discharge: Continued Medical Work up  Expected Discharge Plan and Services Expected Discharge Plan: Skilled Nursing Facility   Discharge Planning Services: CM Consult Post Acute Care Choice: Skilled Nursing Facility Living arrangements for the past 2 months: Independent Living Facility    Social Determinants of Health (SDOH) Interventions    Readmission Risk Interventions    08/20/2020   11:37 AM  Readmission Risk Prevention Plan  Transportation Screening Complete  PCP or Specialist Appt within 3-5 Days Complete  HRI or Home Care Consult Complete  Social Work Consult for Recovery Care Planning/Counseling Complete  Palliative Care Screening Not Applicable  Medication Review [employer]) Complete

## 2022-05-02 DIAGNOSIS — I4891 Unspecified atrial fibrillation: Secondary | ICD-10-CM | POA: Diagnosis not present

## 2022-05-02 DIAGNOSIS — I10 Essential (primary) hypertension: Secondary | ICD-10-CM | POA: Diagnosis not present

## 2022-05-02 DIAGNOSIS — N179 Acute kidney failure, unspecified: Secondary | ICD-10-CM | POA: Diagnosis not present

## 2022-05-02 DIAGNOSIS — N3000 Acute cystitis without hematuria: Secondary | ICD-10-CM

## 2022-05-02 DIAGNOSIS — I5043 Acute on chronic combined systolic (congestive) and diastolic (congestive) heart failure: Secondary | ICD-10-CM | POA: Diagnosis not present

## 2022-05-02 LAB — ANCA TITERS
Atypical P-ANCA titer: <1:20 {titer}
C-ANCA: <1:20 {titer}
P-ANCA: <1:20 {titer}

## 2022-05-02 LAB — URINALYSIS, ROUTINE W REFLEX MICROSCOPIC
Bilirubin Urine: NEGATIVE
Glucose, UA: NEGATIVE mg/dL
Ketones, ur: NEGATIVE mg/dL
Nitrite: NEGATIVE
Protein, ur: >=300 mg/dL — AB
Specific Gravity, Urine: 1.012 (ref 1.005–1.030)
WBC, UA: >50 WBC/hpf — ABNORMAL HIGH (ref 0–5)
pH: 7 (ref 5.0–8.0)

## 2022-05-02 LAB — GLUCOSE, CAPILLARY
Glucose-Capillary: 112 mg/dL — ABNORMAL HIGH (ref 70–99)
Glucose-Capillary: 98 mg/dL (ref 70–99)
Glucose-Capillary: 134 mg/dL — ABNORMAL HIGH (ref 70–99)
Glucose-Capillary: 110 mg/dL — ABNORMAL HIGH (ref 70–99)
Glucose-Capillary: 109 mg/dL — ABNORMAL HIGH (ref 70–99)

## 2022-05-02 LAB — BASIC METABOLIC PANEL
Anion gap: 8 (ref 5–15)
BUN: 47 mg/dL — ABNORMAL HIGH (ref 8–23)
CO2: 30 mmol/L (ref 22–32)
Calcium: 7.8 mg/dL — ABNORMAL LOW (ref 8.9–10.3)
Chloride: 97 mmol/L — ABNORMAL LOW (ref 98–111)
Creatinine, Ser: 2.86 mg/dL — ABNORMAL HIGH (ref 0.61–1.24)
GFR, Estimated: 22 mL/min — ABNORMAL LOW (ref >60)
Glucose, Bld: 111 mg/dL — ABNORMAL HIGH (ref 70–99)
Potassium: 4.2 mmol/L (ref 3.5–5.1)
Sodium: 135 mmol/L (ref 135–145)

## 2022-05-02 LAB — CBC
HCT: 38.1 % — ABNORMAL LOW (ref 39.0–52.0)
Hemoglobin: 12.3 g/dL — ABNORMAL LOW (ref 13.0–17.0)
MCH: 29.9 pg (ref 26.0–34.0)
MCHC: 32.3 g/dL (ref 30.0–36.0)
MCV: 92.5 fL (ref 80.0–100.0)
Platelets: 332 10*3/uL (ref 150–400)
RBC: 4.12 MIL/uL — ABNORMAL LOW (ref 4.22–5.81)
RDW: 15.2 % (ref 11.5–15.5)
WBC: 7.6 10*3/uL (ref 4.0–10.5)
nRBC: 0 % (ref 0.0–0.2)

## 2022-05-02 LAB — COOXEMETRY PANEL
Carboxyhemoglobin: 1.4 % (ref 0.5–1.5)
Methemoglobin: <0.7 % (ref 0.0–1.5)
O2 Saturation: 66.7 %
Total hemoglobin: 12.3 g/dL (ref 12.0–16.0)

## 2022-05-02 LAB — GLOMERULAR BASEMENT MEMBRANE ANTIBODIES: GBM Ab: <0.2 units (ref 0.0–0.9)

## 2022-05-02 LAB — C3 COMPLEMENT: C3 Complement: 135 mg/dL (ref 82–167)

## 2022-05-02 LAB — CYTOLOGY - NON PAP

## 2022-05-02 LAB — ANTINUCLEAR ANTIBODIES, IFA: ANA Ab, IFA: NEGATIVE

## 2022-05-02 LAB — URINE CULTURE: Culture: >=100000 — AB

## 2022-05-02 LAB — C4 COMPLEMENT: Complement C4, Body Fluid: 37 mg/dL (ref 12–38)

## 2022-05-02 LAB — ANTISTREPTOLYSIN O TITER: ASO: 42 IU/mL (ref 0.0–200.0)

## 2022-05-02 MED ORDER — SODIUM CHLORIDE 0.9 % IV SOLN
1.0000 g | INTRAVENOUS | Status: DC
  Administered 2022-05-02: 1 g via INTRAVENOUS
  Filled 2022-05-02: qty 10

## 2022-05-02 MED ORDER — LIDOCAINE HCL URETHRAL/MUCOSAL 2 % EX GEL
1.0000 | Freq: Once | CUTANEOUS | Status: AC
  Administered 2022-05-02: 1 via URETHRAL
  Filled 2022-05-02: qty 6

## 2022-05-02 MED ORDER — AMOXICILLIN 500 MG PO CAPS
500.0000 mg | ORAL_CAPSULE | Freq: Two times a day (BID) | ORAL | Status: DC
  Administered 2022-05-03 – 2022-05-08 (×11): 500 mg via ORAL
  Filled 2022-05-02 (×11): qty 1

## 2022-05-02 MED ORDER — TAMSULOSIN HCL 0.4 MG PO CAPS
0.4000 mg | ORAL_CAPSULE | Freq: Every day | ORAL | Status: DC
  Administered 2022-05-02 – 2022-05-08 (×7): 0.4 mg via ORAL
  Filled 2022-05-02 (×7): qty 1

## 2022-05-02 MED ORDER — METOPROLOL TARTRATE 5 MG/5ML IV SOLN: 5.0000 mg | Freq: Once | INTRAVENOUS | Status: DC

## 2022-05-02 NOTE — Progress Notes (Signed)
  Progress Note   Patient: David Huynh CNO:[id number] DOB: [birth year] DOA: 04/26/2022     5 DOS: the patient was seen and examined on 05/02/2022        Brief hospital course: David Huynh is an 81 y.o. M with sdCHF EF 45-50%, pAF on Eliquis, obesity, DM, CKD IIIb, HTN and hx amio lung fibrosis who presented with fluid overload.  See prior summary.     Assessment and Plan: * Acute on chronic combined systolic and diastolic CHF (congestive heart failure) (HCC) Weight up 1 kg, net -800 cc, I's and O's, creatinine worse. - Hold diuretics per cardiology - Sherryll Burger, MRA per cardiology - Hydralazine, Imdur, metoprolol per cardiology - Continue Unna boots       Atrial fibrillation with RVR (HCC) In and out of atrial tachycardia overnight - Continue metoprolol and apixaban per cardiology  Nephrotic syndrome Acute-on-chronic kidney injury (HCC) Chronic kidney disease stage IIIa -Outpatient nephrology follow-up - Continue Unna boots    Acute cystitis Has developed urinary urgency, urinary retention.   - Can narrow to amoxicillin - Follow urine culture - Insert foley - Okay for Flomax    Type 2 diabetes mellitus with stage 3a chronic kidney disease, with long-term current use of insulin (HCC) Glucoses controlled - Continue SS corrections   Essential hypertension BP controlled - Antihypertensives per cardiology            Subjective: David Huynh with an episode of tachycardia while I am in the room.  Very nauseated and weak.  Episode resolved spontaneously, patient felt fine aftewrads.  No chest pain, no confusion.     Physical Exam: BP (!) 81/50   Pulse 80   Temp (!) 97.2 F (36.2 C)   Resp (!) 22   Ht 6' (1.829 m)   Wt 112.2 kg   SpO2 99%   BMI 33.55 kg/m   Elderly adult male, lying in bed, no acute distress Irregular, tachycardic, no murmurs, pitting edema noted Respiratory rate normal, lungs clear but diminished overall Abdomen soft without  grimace to palpation Attention normal, affect normal after resolution of tachycardia, judgment insight appear normal with severe generalized weakness  Data Reviewed: Metabolic panel shows creatinine up to 2.8 Urine culture growing E. coli SCV O2 66  Family Communication: None present    Disposition: Status is: Inpatient To SNF, hopefully tomorrow        Author: Alberteen Sam, MD 05/02/2022 3:35 PM  For on call review [email or web address].

## 2022-05-02 NOTE — Progress Notes (Incomplete)
Pt's SBP has been running low 80's. Asymptomatic. Notified CHF

## 2022-05-02 NOTE — Progress Notes (Signed)
Patient ID: David Huynh, male   DOB: Mar 03, 1941, 81 y.o.   MRN: [id number]     Advanced Heart Failure Rounding Note  PCP-Cardiologist: [name], MD   Subjective:    Bladder outlet obstruction, 500+ cc in bladder and unable to place foley this morning.   >100K GNRs in urine.   Patient had only rare short atrial tachycardia over the last day, mostly NSR.    Creatinine higher this morning at 2.86 with urinary obstruction. CVP 5 on my read with co-ox 67%.   Objective:   Weight Range: 112.2 kg Body mass index is 33.55 kg/m.   Vital Signs:   Temp:  [97.9 F (36.6 C)-98.7 F (37.1 C)] 97.9 F (36.6 C) (12/19 0207) Pulse Rate:  [74-92] 80 (12/19 0207) Resp:  [20-22] 22 (12/19 0207) BP: (104-130)/(69-80) 113/70 (12/19 0207) SpO2:  [92 %-100 %] 100 % (12/19 0207) Weight:  [112.2 kg] 112.2 kg (12/19 0207) Last BM Date : 05/01/22  Weight change: Filed Weights   04/30/22 0354 05/01/22 0415 05/02/22 0207  Weight: 115.1 kg 111.4 kg 112.2 kg    Intake/Output:   Intake/Output Summary (Last 24 hours) at 05/02/2022 0807 Last data filed at 05/01/2022 2000 Gross per 24 hour  Intake 240 ml  Output 1075 ml  Net -835 ml      Physical Exam    General: NAD Neck: No JVD, no thyromegaly or thyroid nodule.  Lungs: Clear to auscultation bilaterally with normal respiratory effort. CV: Nondisplaced PMI.  Heart regular S1/S2, no S3/S4, no murmur. 1+ edema 1/2 to knees bilaterally.   Abdomen: Soft, nontender, no hepatosplenomegaly, no distention.  Skin: Intact without lesions or rashes.  Neurologic: Alert and oriented x 3.  Psych: Normal affect. Extremities: No clubbing or cyanosis.  HEENT: Normal.    Telemetry   NSR alternating with rare atrial tachycardia rate 120s (personally reviewed).   EKG    N/A   Labs    CBC Recent Labs    05/01/22 0224 05/02/22 0624  WBC 6.4 7.6  HGB 12.7* 12.3*  HCT 40.0 38.1*  MCV 92.8 92.5  PLT 317 332   Basic Metabolic  Panel Recent Labs    05/01/22 0224 05/02/22 0624  NA 135 135  K 3.7 4.2  CL 95* 97*  CO2 30 30  GLUCOSE 120* 111*  BUN 45* 47*  CREATININE 2.46* 2.86*  CALCIUM 8.2* 7.8*   Liver Function Tests No results for input(s): "AST", "ALT", "ALKPHOS", "BILITOT", "PROT", "ALBUMIN" in the last 72 hours. No results for input(s): "LIPASE", "AMYLASE" in the last 72 hours. Cardiac Enzymes No results for input(s): "CKTOTAL", "CKMB", "CKMBINDEX", "TROPONINI" in the last 72 hours.  BNP: BNP (last 3 results) Recent Labs    04/26/22 0100  BNP 1,368.4*    ProBNP (last 3 results) No results for input(s): "PROBNP" in the last 8760 hours.   D-Dimer No results for input(s): "DDIMER" in the last 72 hours. Hemoglobin A1C No results for input(s): "HGBA1C" in the last 72 hours.  Fasting Lipid Panel No results for input(s): "CHOL", "HDL", "LDLCALC", "TRIG", "CHOLHDL", "LDLDIRECT" in the last 72 hours. Thyroid Function Tests No results for input(s): "TSH", "T4TOTAL", "T3FREE", "THYROIDAB" in the last 72 hours.  Invalid input(s): "FREET3"  Other results:   Imaging    No results found.   Medications:     Scheduled Medications:  apixaban  2.5 mg Oral BID   Chlorhexidine Gluconate Cloth  6 each Topical Daily   feeding supplement  237 mL  Oral BID BM   hydrALAZINE  12.5 mg Oral Q8H   insulin aspart  0-15 Units Subcutaneous TID WC   insulin aspart  0-5 Units Subcutaneous QHS   isosorbide mononitrate  15 mg Oral Daily   lidocaine  1 Application Urethral Once   metoprolol succinate  75 mg Oral BID   multivitamin with minerals  1 tablet Oral Daily   rosuvastatin  40 mg Oral Daily   sodium chloride flush  10-40 mL Intracatheter Q12H   sodium chloride flush  3 mL Intravenous Q12H   tamsulosin  0.4 mg Oral QPC breakfast    Infusions:  sodium chloride Stopped (05/01/22 0806)   sodium chloride      PRN Medications: sodium chloride, acetaminophen, hydrALAZINE, labetalol, levalbuterol,  ondansetron (ZOFRAN) IV, sodium chloride flush, sodium chloride flush    Patient Profile   81 y.o. male with a history of CAD, chronic systolic CHF, HTN, HLD, DM, CKD 3, asthma, and PAF w/ h/o amiodarone lung toxicity, admitted w/ a/c CHF and recurrent AF w/ RVR.   Assessment/Plan   1. Acute on chronic CHF: Prior ischemic cardiomyopathy, improved after PCI in 2017.  Echo 03/2018 with EF back down to 35-40% and moderate-severe RV dysfunction. This was in the setting of atrial fibrillation with RVR. EF 40% on TEE 04/2018. Echo in 3/20 showed EF up to 50%.  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  Echo in 7/21 with EF 25-30%, severe RV dysfunction.  TEE in 10/21, however, showed EF up to 50%.  He had PCI to LAD in 10/21. Echo in 3/22 showed stable EF 50-55% with normal RV.   Echo in 8/22 with EF 45-50%, moderate LVH, normal RV, moderate AS.  He returns with marked volume overload in setting of atrial tachycardia with RVR.  No evidence for ACS, suspect exacerbation may be due to atrial tachy/RVR. He had been lost to follow-up. Echo this admit EF 40-45%, RV mildly reduced, moderate AS. PICC placed. Initially with marked peripheral edema and oxygen requirement. Excellent UOP with lasix gtt at 15/hr and metolazone.  Weight is down about 30 lbs.  CVP is 5 today with co-ox 67%, creatinine higher at 2.86 in setting of urinary obstruction.  Suspect residual peripheral edema is primarily due to nephrotic syndrome.  - No diuretic today.  - Hydralazine/Imdur for afterload reduction with elevated creatinine and high BP.   - Would like eventual SGLT2 inhibitor but has UTI and rising creatinine so not now.  2. 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  Had successful DC-CV on 03/29/18 but then back in atrial fibrillation. Ranolazine added, but failed DCCV again 04/02/18. DCCV 04/19/18 was successful.  AF with RVR at 3/22 admission,  amiodarone restarted and he was cardioverted to NSR, but he developed amiodarone lung toxicity.  Amiodarone was stopped. Now s/p atrial fibrillation ablation in 6/22.  At admission, he was in atrial tachycardia with RVR, not sure how long. Suspect this triggered recurrent CHF.  He does not appear to still be on ranolazine at home. Over the weekend, he constantly alternated between NSR and atrial tachy with rate in 120s.  Now he is mostly in NSR with short AT runs.  - No amiodarone with history of lung toxicity, anti-arrhythmic options limited as he also has AKI on CKD stage 3.    - Not Tikosyn candidate with CKD.  - Continue Eliquis 2.5 mg bid (of note, has missed doses recently).  -  Continue Toprol XL 75 mg bid to try to control AT (on Coreg at home).  - Per EP, not candidate for ablation of AT at this time, will need to follow with them as outpatient.   3. AKI on CKD stage 3: Creatinine baseline in past (1 year ago) was around 1.6.   Patient has nephrotic syndrome with 7.7 g/day proteinuria and low albumen.  Suspect this is from diabetic nephropathy though he also uses NSAIDs frequently. Creatinine trended up today, 2.4 => 2.86 in setting of urinary obstruction. - Hold diuretic.   - Hold Entresto, spironolactone (not totally clear that he was taking at home).  - Nephrology following, will need to see them in followup.  - Will need coude catheter today, if unable to place will need urology.  4. CAD: History of DES to RCA in 9/17.  Underwent LHC 04/03/18 with PCI to prox left circ. LHC in 10/21 with PCI to LAD.  No chest pain this admission.  Mild elevation in TnI but no trend, doubt ACS.  - He is on Eliquis, so no ASA.   - Continue statin.  - Defer cath with elevated creatinine and no ACS.  5. Aortic stenosis: Moderate on 8/22 echo. Moderate on echo this admit, AVA 1.50 cm2, DI 0.34, mean gradient 17 mmHG.  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  He improved with steroids in the past.   7. Pleural effusion: Thoracentesis x 2 on left and x 1 on right this admission.  Transudates, due to CHF/nephrotic syndrome.  8. BPH/urinary obstruction: Coude catheter today, add Flomax.  9. UTI: GNRs. - Add ceftriaxone.   [name] 05/02/2022 8:07 AM

## 2022-05-02 NOTE — Progress Notes (Signed)
Pt. With little output during the night. Bladder scan obtained with >500cc in bladder. I/O attempted x2, unsuccessful. Dr. Shirlee Latch on unit and notified. Verbal orders to insert coude cath received. Oncoming RN made aware.

## 2022-05-02 NOTE — Progress Notes (Signed)
Pt's BP was low SBP high 70 - low 80's Asymptomatic. Notified CHF. Will monitor BP every 15 min.

## 2022-05-02 NOTE — Assessment & Plan Note (Signed)
Has developed urinary urgency, urinary retention.   - Start Rocephin - Follow urine culture - Insert foley - Okay for Flomax

## 2022-05-02 NOTE — Progress Notes (Signed)
PT. BP 97/53, HR 77 with BP meds scheduled. On call for [facility] paged to make aware.

## 2022-05-02 NOTE — Progress Notes (Signed)
[facility] Progress Note   Assessment/ Plan:   AKI on CKD 3b - b/l creat 1.5- 1.7 from November 2022. Creat here 2.5 in setting of decompensated CHF/ anasarca, creat hasn't improved much w/ sig diuresis. UA looks infected w/ wbc/rbc's and large protein. Renal US w/o hydro. Work-up shows sig proteinuria 7.7 gm. Has hx DM2 but w/o extensive diabetic complications. +excess nsaid use recently also. 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 Will send serologies- all pending except for complements which are normal. Good UOP on IV lasix gtt per cardiology, has been transitioned to PO torsemide. Vol overload/ anasarca - a/c combined CHF and/or nephrotic syndrome/ CKD.  Alb 1.8 here on admission. Diuretics ongoing and is down 10kg from admission w/ sig edema remaining.  Afib / RVR - not amio candidate, on BB and eliquis' Amio - associated pulm fibrosis HTN - meds as above, BP's good Obesity - BMI > 30 Pyuria - looks like UTI, on ceftriaxone Acute urinary retention- Foley in place    Subjective:    Acute urinary retention overnight, foley placed, urine culture looks infected, on ceftriaxone.     Objective:   BP 111/73   Pulse 80   Temp (!) 97.2 F (36.2 C) (Oral)   Resp (!) 22   Ht 6' (1.829 m)   Wt 112.2 kg   SpO2 100%   BMI 33.55 kg/m   Intake/Output Summary (Last 24 hours) at 05/02/2022 1356 Last data filed at 05/01/2022 2000 Gross per 24 hour  Intake 240 ml  Output 700 ml  Net -460 ml   Weight change: 0.8 kg  Physical Exam: Gen:NAD, sitting in chair CVS: RRR Resp: clear bilaterally no c/w/r Abd: soft, nontender, NABS Ext: 2+ LE edema but with improvement  Imaging: No results found.  Labs: BMET Recent Labs  Lab 04/26/22 0100 04/27/22 0000 04/28/22 0540 04/29/22 0505 04/30/22 0445 05/01/22 0224 05/02/22 0624  NA 138 141 138 139 138 135 135  K 4.1 4.4  3.9 3.9 3.5 3.7 4.2  CL 111 110 104 100 99 95* 97*  CO2 19* 24 27 29 31 30 30   GLUCOSE 90 109* 140* 115* 103* 120* 111*  BUN 42* 42* 45* 43* 44* 45* 47*  CREATININE 2.48* 2.50* 2.48* 2.52* 2.35* 2.46* 2.86*  CALCIUM 8.2* 8.3* 7.8* 8.1* 7.9* 8.2* 7.8*   CBC Recent Labs  Lab 04/29/22 0505 04/30/22 0445 05/01/22 0224 05/02/22 0624  WBC 9.6 7.7 6.4 7.6  HGB 12.6* 13.0 12.7* 12.3*  HCT 39.7 39.5 40.0 38.1*  MCV 94.7 91.2 92.8 92.5  PLT 302 327 317 332    Medications:     apixaban  2.5 mg Oral BID   Chlorhexidine Gluconate Cloth  6 each Topical Daily   feeding supplement  237 mL Oral BID BM   hydrALAZINE  12.5 mg Oral Q8H   insulin aspart  0-15 Units Subcutaneous TID WC   insulin aspart  0-5 Units Subcutaneous QHS   isosorbide mononitrate  15 mg Oral Daily   metoprolol succinate  75 mg Oral BID   metoprolol tartrate  5 mg Intravenous Once   multivitamin with minerals  1 tablet Oral Daily   rosuvastatin  40 mg Oral Daily   sodium chloride flush  10-40 mL Intracatheter Q12H   sodium chloride flush  3 mL Intravenous Q12H   tamsulosin  0.4 mg Oral QPC breakfast    05/04/22, MD 05/02/2022, 1:56  PM

## 2022-05-02 NOTE — NC FL2 (Signed)
[place] MEDICAID FL2 LEVEL OF CARE FORM     IDENTIFICATION  Patient Name: David Huynh Birthdate: [birth year] Sex: male Admission Date (Current Location): 04/26/2022  [facility] [employer] and [place] Number:  Producer, television/film/video and Address:  [facility], [address], [place], [place] [address]      Provider Number: [id number]  Attending Physician Name and Address:  Alberteen Sam, *  Relative Name and Phone Number:  Khadim, Lundberg (Spouse)  [phone] [facility])    Current Level of Care: Hospital Recommended Level of Care: Skilled Nursing Facility Prior Approval Number: [id number]  Date Approved/Denied:   PASRR Number: [id number]  Discharge Plan: SNF    Current Diagnoses: Patient Active Problem List   Diagnosis Date Noted   Nephrotic syndrome 04/30/2022   Hypoalbuminemia 04/28/2022   Bilateral pleural effusion 04/27/2022   Amidorone associated Pulmonary fibrosis (HCC) 04/27/2022   Atrial fibrillation with RVR (HCC) 04/26/2022   Type 2 diabetes mellitus with stage 3a chronic kidney disease, with long-term current use of insulin (HCC) 09/22/2021   Secondary hypercoagulable state (HCC) 11/23/2020   Hyperkalemia 09/05/2020   Diastolic CHF (HCC) 08/18/2020   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   Syncope and collapse 08/09/2020   Acute-on-chronic kidney injury (HCC) 08/09/2020   Type 2 diabetes mellitus with diabetic polyneuropathy, without long-term current use of insulin (HCC) 02/24/2020   Acute on chronic combined systolic (congestive) and diastolic (congestive) heart failure (HCC) 12/06/2019   Acute respiratory failure with hypoxia (HCC) 12/06/2019   Persistent atrial fibrillation (HCC) 03/25/2018   CAD in native artery    Aortic stenosis, mild 06/28/2016   Cardiomyopathy, ischemic 02/17/2016   Acute on chronic combined systolic and diastolic CHF (congestive heart failure) (HCC) 02/17/2016   Dyspnea    Hypertensive heart  disease    Hypocalcemia 02/12/2015   Fracture of multiple ribs 02/12/2015   Other specified forms of effusion, except tuberculous    Essential hypertension    Other and unspecified hyperlipidemia    Liver function study, abnormal 04/26/2011   Obesity    Hyperlipidemia with target LDL less than 70    Mild persistent asthma    Allergic state     Orientation RESPIRATION BLADDER Height & Weight     Self, Time, Situation, Place  O2 (2L ) Incontinent, Indwelling catheter Weight: 247 lb 5.7 oz (112.2 kg) Height:  6' (182.9 cm)  BEHAVIORAL SYMPTOMS/MOOD NEUROLOGICAL BOWEL NUTRITION STATUS      Incontinent Diet (see d/c summary)  AMBULATORY STATUS COMMUNICATION OF NEEDS Skin   Extensive Assist Verbally Normal                       Personal Care Assistance Level of Assistance  Bathing, Feeding, Dressing Bathing Assistance: Limited assistance Feeding assistance: Independent Dressing Assistance: Limited assistance     Functional Limitations Info  Sight, Speech, Hearing Sight Info: Adequate Hearing Info: Adequate Speech Info: Adequate    SPECIAL CARE FACTORS FREQUENCY  PT (By licensed PT), OT (By licensed OT)     PT Frequency: 5x/week OT Frequency: 5x/week            Contractures Contractures Info: Not present    Additional Factors Info  Code Status, Allergies Code Status Info: Full code Allergies Info: Amiodarone           Current Medications (05/02/2022):  This is the current hospital active medication list Current Facility-Administered Medications  Medication Dose Route Frequency Provider Last Rate Last Admin  0.45 % sodium chloride infusion   Intravenous Continuous Alberteen Sam, MD   Stopped at 05/01/22 0806   0.9 %  sodium chloride infusion  250 mL Intravenous PRN Hillary Bow, DO 10 mL/hr at 05/02/22 1051 250 mL at 05/02/22 1051   acetaminophen (TYLENOL) tablet 650 mg  650 mg Oral Q4H PRN Hillary Bow, DO   650 mg at 04/30/22 2007    apixaban (ELIQUIS) tablet 2.5 mg  2.5 mg Oral BID Hillary Bow, DO   2.5 mg at 05/02/22 1030   cefTRIAXone (ROCEPHIN) 1 g in sodium chloride 0.9 % 100 mL IVPB  1 g Intravenous Q24H Laurey Morale, MD 200 mL/hr at 05/02/22 1052 1 g at 05/02/22 1052   Chlorhexidine Gluconate Cloth 2 % PADS 6 each  6 each Topical Daily Alberteen Sam, MD   6 each at 05/01/22 0959   feeding supplement (ENSURE ENLIVE / ENSURE PLUS) liquid 237 mL  237 mL Oral BID BM Alberteen Sam, MD   237 mL at 04/30/22 0826   hydrALAZINE (APRESOLINE) injection 10 mg  10 mg Intravenous Q6H PRN Danford, Earl Lites, MD       hydrALAZINE (APRESOLINE) tablet 12.5 mg  12.5 mg Oral Q8H Clegg, Amy D, NP   12.5 mg at 05/02/22 0537   insulin aspart (novoLOG) injection 0-15 Units  0-15 Units Subcutaneous TID WC Alberteen Sam, MD   2 Units at 05/01/22 1706   insulin aspart (novoLOG) injection 0-5 Units  0-5 Units Subcutaneous QHS Alberteen Sam, MD   2 Units at 04/29/22 2228   isosorbide mononitrate (IMDUR) 24 hr tablet 15 mg  15 mg Oral Daily Clegg, Amy D, NP   15 mg at 05/02/22 1030   labetalol (NORMODYNE) injection 10 mg  10 mg Intravenous Q6H PRN Alberteen Sam, MD   10 mg at 04/26/22 1459   levalbuterol (XOPENEX) nebulizer solution 0.63 mg  0.63 mg Nebulization Q6H PRN Alberteen Sam, MD   0.63 mg at 04/29/22 0815   lidocaine (XYLOCAINE) 2 % jelly 1 Application  1 Application Urethral Once Laurey Morale, MD       metoprolol succinate (TOPROL-XL) 24 hr tablet 75 mg  75 mg Oral BID Laurey Morale, MD   75 mg at 05/02/22 1029   multivitamin with minerals tablet 1 tablet  1 tablet Oral Daily Alberteen Sam, MD   1 tablet at 05/02/22 1029   ondansetron (ZOFRAN) injection 4 mg  4 mg Intravenous Q6H PRN Hillary Bow, DO       rosuvastatin (CRESTOR) tablet 40 mg  40 mg Oral Daily Lyda Perone M, DO   40 mg at 05/02/22 1029   sodium chloride flush (NS) 0.9 % injection 10-40 mL   10-40 mL Intracatheter Q12H Danford, Earl Lites, MD   10 mL at 05/01/22 2138   sodium chloride flush (NS) 0.9 % injection 10-40 mL  10-40 mL Intracatheter PRN Danford, Earl Lites, MD       sodium chloride flush (NS) 0.9 % injection 3 mL  3 mL Intravenous Q12H Lyda Perone M, DO   3 mL at 04/30/22 0828   sodium chloride flush (NS) 0.9 % injection 3 mL  3 mL Intravenous PRN Hillary Bow, DO   3 mL at 05/01/22 2148   tamsulosin (FLOMAX) capsule 0.4 mg  0.4 mg Oral QPC breakfast Laurey Morale, MD   0.4 mg at 05/02/22 1030  Discharge Medications: Please see discharge summary for a list of discharge medications.  Relevant Imaging Results:  Relevant Lab Results:   Additional Information SSN [id number]  Erin Sons, LCSW

## 2022-05-03 DIAGNOSIS — I10 Essential (primary) hypertension: Secondary | ICD-10-CM | POA: Diagnosis not present

## 2022-05-03 DIAGNOSIS — N179 Acute kidney failure, unspecified: Secondary | ICD-10-CM | POA: Diagnosis not present

## 2022-05-03 DIAGNOSIS — I4891 Unspecified atrial fibrillation: Secondary | ICD-10-CM | POA: Diagnosis not present

## 2022-05-03 DIAGNOSIS — I5043 Acute on chronic combined systolic (congestive) and diastolic (congestive) heart failure: Secondary | ICD-10-CM | POA: Diagnosis not present

## 2022-05-03 LAB — CBC
HCT: 34.2 % — ABNORMAL LOW (ref 39.0–52.0)
Hemoglobin: 11 g/dL — ABNORMAL LOW (ref 13.0–17.0)
MCH: 30 pg (ref 26.0–34.0)
MCHC: 32.2 g/dL (ref 30.0–36.0)
MCV: 93.2 fL (ref 80.0–100.0)
Platelets: 328 10*3/uL (ref 150–400)
RBC: 3.67 MIL/uL — ABNORMAL LOW (ref 4.22–5.81)
RDW: 15.3 % (ref 11.5–15.5)
WBC: 10.4 10*3/uL (ref 4.0–10.5)
nRBC: 0 % (ref 0.0–0.2)

## 2022-05-03 LAB — BASIC METABOLIC PANEL
Anion gap: 8 (ref 5–15)
BUN: 53 mg/dL — ABNORMAL HIGH (ref 8–23)
CO2: 30 mmol/L (ref 22–32)
Calcium: 7.5 mg/dL — ABNORMAL LOW (ref 8.9–10.3)
Chloride: 98 mmol/L (ref 98–111)
Creatinine, Ser: 3.09 mg/dL — ABNORMAL HIGH (ref 0.61–1.24)
GFR, Estimated: 20 mL/min — ABNORMAL LOW (ref >60)
Glucose, Bld: 97 mg/dL (ref 70–99)
Potassium: 4 mmol/L (ref 3.5–5.1)
Sodium: 136 mmol/L (ref 135–145)

## 2022-05-03 LAB — GLUCOSE, CAPILLARY
Glucose-Capillary: 100 mg/dL — ABNORMAL HIGH (ref 70–99)
Glucose-Capillary: 132 mg/dL — ABNORMAL HIGH (ref 70–99)
Glucose-Capillary: 117 mg/dL — ABNORMAL HIGH (ref 70–99)
Glucose-Capillary: 125 mg/dL — ABNORMAL HIGH (ref 70–99)

## 2022-05-03 LAB — COOXEMETRY PANEL
Carboxyhemoglobin: 1.4 % (ref 0.5–1.5)
Methemoglobin: <0.7 % (ref 0.0–1.5)
O2 Saturation: 60 %
Total hemoglobin: 11.2 g/dL — ABNORMAL LOW (ref 12.0–16.0)

## 2022-05-03 MED ORDER — ALBUMIN HUMAN 25 % IV SOLN
25.0000 g | Freq: Once | INTRAVENOUS | Status: AC
  Administered 2022-05-03: 25 g via INTRAVENOUS
  Filled 2022-05-03: qty 100

## 2022-05-03 MED ORDER — ROSUVASTATIN CALCIUM 5 MG PO TABS
10.0000 mg | ORAL_TABLET | Freq: Every day | ORAL | Status: DC
  Administered 2022-05-04 – 2022-05-08 (×5): 10 mg via ORAL
  Filled 2022-05-03 (×5): qty 2

## 2022-05-03 NOTE — Progress Notes (Signed)
   05/03/22 1519  Mobility  Activity Ambulated with assistance in room  Level of Assistance Moderate assist, patient does 50-74%  Assistive Device Four wheel walker  Distance Ambulated (ft) 20 ft  Activity Response Tolerated well  Mobility Referral Yes  $Mobility charge 1 Mobility   Mobility Specialist Progress Note  Pt was in bed and agreeable. Had c/o knee pain. Returned to bed w/ all needs met and call bell in reach.   Lucious Groves Mobility Specialist  Please contact via SecureChat or Rehab office at [phone]

## 2022-05-03 NOTE — Progress Notes (Signed)
Physical Therapy Treatment Patient Details Name: David Huynh MRN: [id number] DOB: [birth year] Today's Date: 05/03/2022   History of Present Illness Patient is a 81 y/o male who presents on 12/13 with SOB for 1 month and edema in BLEs. Found to have A-fib with RVR, AKI on CKD and acute on chronic CHF exacerbation. s/p thoracentesis 12/14. PMH includes COVID 19, A-fib, CAD, CHF, CKD III, DM, HTN, obesity, chronic lung inflammation.    PT Comments    Pt seen for PT tx. Pt declines OOB mobility reporting he already walked & has chronic R knee pain that limits him (reports he was supposed to receive a "gel shot" soon but hospitalization hindered this). PT encouraged pt to perform bed level exercises but pt not receptive to education/cuing re: proper technique. PT attempts to educate pt on rehab at SNF but pt states he already knows what to expect. Pt states "let's just do what we need to do to get it over with" re: current session. Per OT note, pt is now agreeable to SNF rehab upon d/c so PT frequency has been changed to 2x/week. Will f/u as able.    Recommendations for follow up therapy are one component of a multi-disciplinary discharge planning process, led by the attending physician.  Recommendations may be updated based on patient status, additional functional criteria and insurance authorization.  Follow Up Recommendations  Skilled nursing-short term rehab (<3 hours/day) Can patient physically be transported by private vehicle: No   Assistance Recommended at Discharge Frequent or constant Supervision/Assistance  Patient can return home with the following A lot of help with walking and/or transfers;A little help with bathing/dressing/bathroom;Assistance with cooking/housework;Direct supervision/assist for medications management;Direct supervision/assist for financial management;Assist for transportation;Help with stairs or ramp for entrance   Equipment Recommendations  None recommended by  PT    Recommendations for Other Services       Precautions / Restrictions Precautions Precautions: Fall;Other (comment) Precaution Comments: monitor O2 Restrictions Weight Bearing Restrictions: No Other Position/Activity Restrictions: PICC line restrictions     Mobility  Bed Mobility                    Transfers                        Ambulation/Gait                   Stairs             Wheelchair Mobility    Modified Rankin (Stroke Patients Only)       Balance                                            Cognition Arousal/Alertness: Awake/alert Behavior During Therapy: Flat affect Overall Cognitive Status: Within Functional Limits for tasks assessed                                 General Comments: Easily irritable, not open to education/encouragement.        Exercises General Exercises - Lower Extremity Heel Slides: AROM, Strengthening, Both, 10 reps, Supine Hip ABduction/ADduction: AROM, Supine, Strengthening, Both, 10 reps (hip abduction slides) Straight Leg Raises: Supine, AROM, Strengthening, Both, 10 reps    General Comments        Pertinent Vitals/Pain  Pain Assessment Pain Assessment: Faces Faces Pain Scale: Hurts a little bit Pain Location: chronic R knee pain Pain Descriptors / Indicators: Discomfort Pain Intervention(s): Monitored during session    Home Living                          Prior Function            PT Goals (current goals can now be found in the care plan section) Acute Rehab PT Goals Patient Stated Goal: to go home PT Goal Formulation: With patient Time For Goal Achievement: 05/13/22 Potential to Achieve Goals: Fair Progress towards PT goals: Progressing toward goals    Frequency    Min 2X/week      PT Plan Frequency needs to be updated    Co-evaluation              AM-PAC PT "6 Clicks" Mobility   Outcome Measure  Help  needed turning from your back to your side while in a flat bed without using bedrails?: A Little Help needed moving from lying on your back to sitting on the side of a flat bed without using bedrails?: A Little Help needed moving to and from a bed to a chair (including a wheelchair)?: A Little Help needed standing up from a chair using your arms (e.g., wheelchair or bedside chair)?: A Little Help needed to walk in hospital room?: Total Help needed climbing 3-5 steps with a railing? : Total 6 Click Score: 14    End of Session   Activity Tolerance:  (pt self limiting) Patient left: in bed;with call bell/phone within reach;with bed alarm set   PT Visit Diagnosis: Muscle weakness (generalized) (M62.81);Difficulty in walking, not elsewhere classified (R26.2);Unsteadiness on feet (R26.81)     Time: 1500-1509 PT Time Calculation (min) (ACUTE ONLY): 9 min  Charges:  $Therapeutic Exercise: 8-22 mins                     Aleda Grana, PT, DPT 05/03/22, 3:20 PM  Sandi Mariscal 05/03/2022, 3:18 PM

## 2022-05-03 NOTE — Progress Notes (Addendum)
Patient ID: David Huynh, male   DOB: [birth year], 81 y.o.   MRN: [id number]     Advanced Heart Failure Rounding Note  PCP-Cardiologist: David Ancona, MD   Subjective:   Coude cath placed yesterday  >100K GNRs in urine.   Creatinine higher this morning 2.86>3.09 with urinary obstruction. CVP 5/6 on my read with co-ox 60%.   Feels great this morning. Denies CP and SOB.  Objective:   Weight Range: 112.7 kg Body mass index is 33.7 kg/m.   Vital Signs:   Temp:  [97.2 F (36.2 C)-98.9 F (37.2 C)] 97.6 F (36.4 C) (12/20 0450) Pulse Rate:  [75-83] 77 (12/20 0450) Resp:  [21] 21 (12/20 0450) BP: (76-117)/(43-73) 108/56 (12/20 0450) SpO2:  [90 %-99 %] 90 % (12/20 0450) Weight:  [112.7 kg] 112.7 kg (12/20 0450) Last BM Date : 05/01/22  Weight change: Filed Weights   05/01/22 0415 05/02/22 0207 05/03/22 0450  Weight: 111.4 kg 112.2 kg 112.7 kg    Intake/Output:   Intake/Output Summary (Last 24 hours) at 05/03/2022 0759 Last data filed at 05/03/2022 0455 Gross per 24 hour  Intake 580 ml  Output 1100 ml  Net -520 ml      Physical Exam    General:  elderly appearing.  No respiratory difficulty HEENT: normal Neck: supple. JVD ~5 cm. Carotids 2+ bilat; no bruits. No lymphadenopathy or thyromegaly appreciated. Cor: PMI nondisplaced. Regular rate & rhythm. No rubs, gallops or murmurs. Lungs: clear Abdomen: soft, nontender, nondistended. No hepatosplenomegaly. No bruits or masses. Good bowel sounds. Extremities: no cyanosis, clubbing, rash, +1 BLE edema. PICC RUE. + UNNA boots Neuro: alert & oriented x 3, cranial nerves grossly intact. moves all 4 extremities w/o difficulty. Affect pleasant.   Telemetry   NSR 80s (personally reviewed).   EKG    N/A   Labs    CBC Recent Labs    05/02/22 0624 05/03/22 0647  WBC 7.6 10.4  HGB 12.3* 11.0*  HCT 38.1* 34.2*  MCV 92.5 93.2  PLT 332 328   Basic Metabolic Panel Recent Labs    61/60/73 0624 05/03/22 0647   NA 135 136  K 4.2 4.0  CL 97* 98  CO2 30 30  GLUCOSE 111* 97  BUN 47* 53*  CREATININE 2.86* 3.09*  CALCIUM 7.8* 7.5*   Liver Function Tests No results for input(s): "AST", "ALT", "ALKPHOS", "BILITOT", "PROT", "ALBUMIN" in the last 72 hours. No results for input(s): "LIPASE", "AMYLASE" in the last 72 hours. Cardiac Enzymes No results for input(s): "CKTOTAL", "CKMB", "CKMBINDEX", "TROPONINI" in the last 72 hours.  BNP: BNP (last 3 results) Recent Labs    04/26/22 0100  BNP 1,368.4*    ProBNP (last 3 results) No results for input(s): "PROBNP" in the last 8760 hours.   D-Dimer No results for input(s): "DDIMER" in the last 72 hours. Hemoglobin A1C No results for input(s): "HGBA1C" in the last 72 hours.  Fasting Lipid Panel No results for input(s): "CHOL", "HDL", "LDLCALC", "TRIG", "CHOLHDL", "LDLDIRECT" in the last 72 hours. Thyroid Function Tests No results for input(s): "TSH", "T4TOTAL", "T3FREE", "THYROIDAB" in the last 72 hours.  Invalid input(s): "FREET3"  Other results:   Imaging    No results found.   Medications:     Scheduled Medications:  amoxicillin  500 mg Oral Q12H   apixaban  2.5 mg Oral BID   Chlorhexidine Gluconate Cloth  6 each Topical Daily   feeding supplement  237 mL Oral BID BM   insulin aspart  0-15 Units Subcutaneous TID WC   insulin aspart  0-5 Units Subcutaneous QHS   metoprolol succinate  75 mg Oral BID   metoprolol tartrate  5 mg Intravenous Once   multivitamin with minerals  1 tablet Oral Daily   rosuvastatin  40 mg Oral Daily   sodium chloride flush  10-40 mL Intracatheter Q12H   sodium chloride flush  3 mL Intravenous Q12H   tamsulosin  0.4 mg Oral QPC breakfast    Infusions:  sodium chloride Stopped (05/01/22 0806)   sodium chloride Stopped (05/02/22 1150)    PRN Medications: sodium chloride, acetaminophen, hydrALAZINE, labetalol, levalbuterol, ondansetron (ZOFRAN) IV, sodium chloride flush, sodium chloride  flush    Patient Profile   81 y.o. male with a history of CAD, chronic systolic CHF, HTN, HLD, DM, CKD 3, asthma, and PAF w/ h/o amiodarone lung toxicity, admitted w/ a/c CHF and recurrent AF w/ RVR.   Assessment/Plan   1. Acute on chronic CHF: Prior ischemic cardiomyopathy, improved after PCI in 2017.  Echo 03/2018 with EF back down to 35-40% and moderate-severe RV dysfunction. This was in the setting of atrial fibrillation with RVR. EF 40% on TEE 04/2018. Echo in 3/20 showed EF up to 50%.  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  Echo in 7/21 with EF 25-30%, severe RV dysfunction.  TEE in 10/21, however, showed EF up to 50%.  He had PCI to LAD in 10/21. Echo in 3/22 showed stable EF 50-55% with normal RV.   Echo in 8/22 with EF 45-50%, moderate LVH, normal RV, moderate AS.  He returns with marked volume overload in setting of atrial tachycardia with RVR.  No evidence for ACS, suspect exacerbation may be due to atrial tachy/RVR. He had been lost to follow-up. Echo this admit EF 40-45%, RV mildly reduced, moderate AS. PICC placed. Initially with marked peripheral edema and oxygen requirement. Excellent UOP with lasix gtt at 15/hr and metolazone.  Weight is down about 30 lbs.  CVP is 5/6 today with co-ox 60%, creatinine higher at 3.09 in setting of urinary obstruction.  Suspect residual peripheral edema is primarily due to nephrotic syndrome.  - No diuretic today.  - Off Hydralazine/Imdur with soft BP  - Would like eventual SGLT2 inhibitor but has UTI and rising creatinine so not now.  2. 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  Had successful DC-CV on 03/29/18 but then back in atrial fibrillation. Ranolazine added, but failed DCCV again 04/02/18. DCCV 04/19/18 was successful.  AF with RVR at 3/22 admission, amiodarone restarted and he was cardioverted to NSR, but he developed amiodarone lung toxicity.  Amiodarone was  stopped. Now s/p atrial fibrillation ablation in 6/22.  At admission, he was in atrial tachycardia with RVR, not sure how long. Suspect this triggered recurrent CHF.  He does not appear to still be on ranolazine at home. Over the weekend, he constantly alternated between NSR and atrial tachy with rate in 120s.  Now he is mostly in NSR with short AT runs.  - No amiodarone with history of lung toxicity, anti-arrhythmic options limited as he also has AKI on CKD stage 3.    - Not Tikosyn candidate with CKD.  - Continue Eliquis 2.5 mg bid (of note, has missed doses recently).  - Continue Toprol XL 75 mg bid to try to control AT (on Coreg at home).  - Per EP, not candidate for ablation of AT at this time, will  need to follow with them as outpatient.   3. AKI on CKD stage 3: Creatinine baseline in past (1 year ago) was around 1.6.   Patient has nephrotic syndrome with 7.7 g/day proteinuria and low albumen.  Suspect this is from diabetic nephropathy though he also uses NSAIDs frequently. Creatinine trended up today, 2.4 => 2.86 => 3.09 in setting of urinary obstruction. - Hold diuretic.   - Hold Entresto, spironolactone (not totally clear that he was taking at home).  - Nephrology following, will need to see them in followup.  - Coude catheter placed yesterday 4. CAD: History of DES to RCA in 9/17.  Underwent LHC 04/03/18 with PCI to prox left circ. LHC in 10/21 with PCI to LAD.  No chest pain this admission.  Mild elevation in TnI but no trend, doubt ACS.  - He is on Eliquis, so no ASA.   - Continue statin.  - Defer cath with elevated creatinine and no ACS.  5. Aortic stenosis: Moderate on 8/22 echo. Moderate on echo this admit, AVA 1.50 cm2, DI 0.34, mean gradient 17 mmHG.  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  He improved with steroids in the past.  7. Pleural effusion: Thoracentesis x 2 on left and x 1 on right this admission.  Transudates, due to CHF/nephrotic syndrome.  8. BPH/urinary  obstruction: Coude catheter placed yesterday, continue Flomax.  9. UTI: GNRs. - Continue ceftriaxone.  - No SGLT2i for now  Alen Bleacher AGACNP-BC  05/03/2022 7:59 AM  Patient seen with NP, agree with the above note.   Creatinine mildly higher today at 3.09.  He had urinary obstruction yesterday, coude catheter eventually placed.  He is staying mostly in NSR, BP has been lower and hydralazine/imdur stopped.  CVP 5-6 today.   E coli UTI on ceftriaxone.   General: NAD Neck: No JVD, no thyromegaly or thyroid nodule.  Lungs: Clear to auscultation bilaterally with normal respiratory effort. CV: Nondisplaced PMI.  Heart regular S1/S2, no S3/S4, no murmur.  Trace ankle edema.   Abdomen: Soft, nontender, no hepatosplenomegaly, no distention.  Skin: Intact without lesions or rashes.  Neurologic: Alert and oriented x 3.  Psych: Normal affect. Extremities: No clubbing or cyanosis.  HEENT: Normal.   Patient now remaining mostly in NSR, rare atrial tachycardia.  Continue Toprol XL 75 mg bid.  Followup EP as outpatient.   Will keep off hydralazine/Imdur, SBP 100s.   Creatinine mildly higher today 2.86 => 3.09 in setting of urinary obstruction yesterday. Foley now in place. CVP 5-6, hold diuretics.   Patient has nephrotic syndrome, likely significant contributor to his peripheral edema.  Nephrology following.   Ceftriaxone for E coli UTI.   Work with PT, eventually to rehab facility.   David Huynh 05/03/2022 8:33 AM

## 2022-05-03 NOTE — Progress Notes (Signed)
[facility] Progress Note   Assessment/ Plan:   AKI on CKD 3b - b/l creat 1.5- 1.7 from November 2022. Creat here 2.5 in setting of decompensated CHF/ anasarca, creat hasn't improved much w/ sig diuresis. UA looks infected w/ wbc/rbc's and large protein. Renal US w/o hydro. Work-up shows sig proteinuria 7.7 gm. Has hx DM2 but w/o extensive diabetic complications. +excess nsaid use recently also. 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 Will send serologies- all pending except for complements which are normal. Good UOP on IV lasix gtt per cardiology, has been transitioned to PO torsemide- held today.  - I think that if cr downtrends tomorrow OK to d/c Vol overload/ anasarca - a/c combined CHF and/or nephrotic syndrome/ CKD.  Alb 1.8 here on admission. Diuretics ongoing and is down 10kg from admission w/ sig edema remaining.  Afib / RVR - not amio candidate, on BB and eliquis' Amio - associated pulm fibrosis HTN - meds as above, BP's good Obesity - BMI > 30 Pyuria - looks like UTI, on ceftriaxone Acute urinary retention- Foley in place    Subjective:    Seen in room.  Cr bumped up a little .  Torsemide held today   Objective:   BP 106/63 (BP Location: Left Arm)   Pulse 82   Temp 97.6 F (36.4 C) (Oral)   Resp 16   Ht 6' (1.829 m)   Wt 112.7 kg   SpO2 90%   BMI 33.70 kg/m   Intake/Output Summary (Last 24 hours) at 05/03/2022 1405 Last data filed at 05/03/2022 1014 Gross per 24 hour  Intake 800 ml  Output 1550 ml  Net -750 ml   Weight change: 0.5 kg  Physical Exam: Gen:NAD, sitting in chair CVS: RRR Resp: clear bilaterally no c/w/r Abd: soft, nontender, NABS Ext: 2+ LE edema but with improvement  Imaging: No results found.  Labs: BMET Recent Labs  Lab 04/27/22 0000 04/28/22 0540 04/29/22 0505 04/30/22 0445 05/01/22 0224 05/02/22 0624 05/03/22 0647   NA 141 138 139 138 135 135 136  K 4.4 3.9 3.9 3.5 3.7 4.2 4.0  CL 110 104 100 99 95* 97* 98  CO2 24 27 29 31 30 30 30   GLUCOSE 109* 140* 115* 103* 120* 111* 97  BUN 42* 45* 43* 44* 45* 47* 53*  CREATININE 2.50* 2.48* 2.52* 2.35* 2.46* 2.86* 3.09*  CALCIUM 8.3* 7.8* 8.1* 7.9* 8.2* 7.8* 7.5*   CBC Recent Labs  Lab 04/30/22 0445 05/01/22 0224 05/02/22 0624 05/03/22 0647  WBC 7.7 6.4 7.6 10.4  HGB 13.0 12.7* 12.3* 11.0*  HCT 39.5 40.0 38.1* 34.2*  MCV 91.2 92.8 92.5 93.2  PLT 327 317 332 328    Medications:     amoxicillin  500 mg Oral Q12H   apixaban  2.5 mg Oral BID   Chlorhexidine Gluconate Cloth  6 each Topical Daily   feeding supplement  237 mL Oral BID BM   insulin aspart  0-15 Units Subcutaneous TID WC   insulin aspart  0-5 Units Subcutaneous QHS   metoprolol succinate  75 mg Oral BID   multivitamin with minerals  1 tablet Oral Daily   [START ON 05/04/2022] rosuvastatin  10 mg Oral Daily   sodium chloride flush  10-40 mL Intracatheter Q12H   sodium chloride flush  3 mL Intravenous Q12H   tamsulosin  0.4 mg Oral QPC breakfast    05/06/2022, MD 05/03/2022,  2:05 PM

## 2022-05-03 NOTE — Plan of Care (Signed)
  Problem: Coping: Goal: Level of anxiety will decrease Outcome: Progressing   Problem: Elimination: Goal: Will not experience complications related to urinary retention Outcome: Progressing   

## 2022-05-03 NOTE — Progress Notes (Signed)
Occupational Therapy Treatment Patient Details Name: David Huynh MRN: [id number] DOB: Mar 25, 1941 Today's Date: 05/03/2022   History of present illness Patient is a 81 y/o male who presents on 12/13 with SOB for 1 month and edema in BLEs. Found to have A-fib with RVR, AKI on CKD and acute on chronic CHF exacerbation. s/p thoracentesis 12/14. PMH includes COVID 19, A-fib, CAD, CHF, CKD III, DM, HTN, obesity, chronic lung inflammation.   OT comments  Pt needing encouragement for OOB, but only needing min assist to stand and min guard to transfer bed to chair. Pt reports his legs don't feel as heavy as they did a few days ago. Pt donned front opening gown and groomed in sitting with set up. He is agreeable to ST rehab at [facility], updated d/c plan.    Recommendations for follow up therapy are one component of a multi-disciplinary discharge planning process, led by the attending physician.  Recommendations may be updated based on patient status, additional functional criteria and insurance authorization.    Follow Up Recommendations  Skilled nursing-short term rehab (<3 hours/day)     Assistance Recommended at Discharge Frequent or constant Supervision/Assistance  Patient can return home with the following  A little help with walking and/or transfers;A lot of help with bathing/dressing/bathroom;Assistance with cooking/housework;Assist for transportation;Help with stairs or ramp for entrance   Equipment Recommendations  Other (comment) (defer to next venue)    Recommendations for Other Services      Precautions / Restrictions Precautions Precautions: Fall;Other (comment) Restrictions Weight Bearing Restrictions: No       Mobility Bed Mobility Overal bed mobility: Needs Assistance Bed Mobility: Supine to Sit     Supine to sit: Min assist     General bed mobility comments: HOB up, use of rail, assist to raise trunk    Transfers Overall transfer level: Needs  assistance Equipment used: Rolling walker (2 wheels), 1 person hand held assist Transfers: Sit to/from Stand, Bed to chair/wheelchair/BSC Sit to Stand: Min assist, From elevated surface     Step pivot transfers: Min guard     General transfer comment: use of momentum, assist to rise and steady     Balance Overall balance assessment: Needs assistance   Sitting balance-Leahy Scale: Fair     Standing balance support: Bilateral upper extremity supported, During functional activity Standing balance-Leahy Scale: Poor Standing balance comment: reliant on B UE suppport of RW                           ADL either performed or assessed with clinical judgement   ADL Overall ADL's : Needs assistance/impaired     Grooming: Wash/dry hands;Wash/dry face;Oral care;Brushing hair;Sitting;Set up           Upper Body Dressing : Set up;Sitting                          Extremity/Trunk Assessment              Vision       Perception     Praxis      Cognition Arousal/Alertness: Awake/alert Behavior During Therapy: WFL for tasks assessed/performed Overall Cognitive Status: Within Functional Limits for tasks assessed                                 General Comments: needing encouragement for OOB  Exercises      Shoulder Instructions       General Comments      Pertinent Vitals/ Pain       Pain Assessment Pain Assessment: No/denies pain  Home Living                                          Prior Functioning/Environment              Frequency  Min 2X/week        Progress Toward Goals  OT Goals(current goals can now be found in the care plan section)  Progress towards OT goals: Progressing toward goals  Acute Rehab OT Goals OT Goal Formulation: With patient Time For Goal Achievement: 05/14/22 Potential to Achieve Goals: Good  Plan Discharge plan needs to be updated    Co-evaluation                  AM-PAC OT "6 Clicks" Daily Activity     Outcome Measure   Help from another person eating meals?: None Help from another person taking care of personal grooming?: A Little Help from another person toileting, which includes using toliet, bedpan, or urinal?: A Lot Help from another person bathing (including washing, rinsing, drying)?: A Lot Help from another person to put on and taking off regular upper body clothing?: A Little Help from another person to put on and taking off regular lower body clothing?: A Lot 6 Click Score: 16    End of Session Equipment Utilized During Treatment: Rolling walker (2 wheels);Gait belt  OT Visit Diagnosis: Unsteadiness on feet (R26.81);Muscle weakness (generalized) (M62.81)   Activity Tolerance Patient tolerated treatment well   Patient Left in chair;with call bell/phone within reach   Nurse Communication          Time: 1209-1242 OT Time Calculation (min): 33 min  Charges: OT General Charges $OT Visit: 1 Visit OT Treatments $Self Care/Home Management : 23-37 mins  Berna Spare, OTR/L Acute Rehabilitation Services Office: [phone]   Evern Bio 05/03/2022, 12:55 PM

## 2022-05-03 NOTE — Progress Notes (Signed)
Progress Note   Patient: David Huynh FWY:[id number] DOB: 07-22-1940 DOA: 04/26/2022     6 DOS: the patient was seen and examined on 05/03/2022        Brief hospital course: [name] is an 81 y.o. M with sdCHF EF 45-50%, pAF on Eliquis, obesity, DM, CKD IIIb, HTN and hx amio lung fibrosis who presented with progressive SOB and edema.  Wife reported poor compliance with meds.    12/13: Admitted in respiratory distress on BiPAP, Lasix started; Cardiology consulted 12/14: Still on BiPAP; Pulmonology consulted; underwent thoracentesis 12/15: Breathing improved 12/16: Repeat thoracentesis 12/17: Nephrology consulted 12/18: Transitioned to oral diuretics 12/19: Urinary retention, antibiotics started 12/20: Cr up again, given albumin     Assessment and Plan: * Acute on chronic combined systolic and diastolic CHF (congestive heart failure) (HCC) Presented with leg swelling, dyspnea on exertion, CXR showing bilateral opacities and pleural effusions.  Weight stable, net even on I/Os.  Cr up to 3.1 mg/dL off diuretics  - Diuretics, afterload reduction on hold per Cardiology - Sherryll Burger, MRA on hold with AKI - Continue metoprolol  - D/c with Unna boots    Atrial fibrillation with RVR (HCC) Amiodarone contraindicated.   - Continue apixaban, metoprolol per Cardiology  - Consult EP per Cardiology  Nephrotic syndrome - Consult nephrology, appreciate expertise - Unna boots  Acute-on-chronic kidney injury (HCC) Chronic kidney disease stage IIIa baseline 1.6, was 2.5 mg/dL on admission, no improvement with decongestion.    Cr here up to 3.1 mg/dL in the last two days. - Hold diuretics - Give albumin - Maintain foley for now - Follow Cr - Outpatient nephrology follow up  Acute cystitis Developed urinary retention, urinary urgency 12/18.  Urine culture with E coli. - Continue amoxicillin, day 2 of 5 - Continue new Flomax - I will plan to continue foley at discharge  and recommend close Urology follow up for voiding trial, given he required Coude    Hypoalbuminemia - Ensure  Amidorone associated Pulmonary fibrosis [facility]) Admitted Apr 2022 with bilateral infiltrates and found to have amio lung toxicity, completed prolonged prednisone taper and was improving, but CT 9/22 still showed amio associated lung fibrosis.  Doubt this is primary driver at this time.  Bilateral pleural effusion 1.5L taken off left lung on 12/14.  Transudate.  Breathing immediately better after.  CXR 12/16 showed large effusions again, repeat bilateral thoracentesis of 2.7L more fluid. - Repeat CXR in 2 weeks  Type 2 diabetes mellitus with stage 3a chronic kidney disease, with long-term current use of insulin (HCC) Glucoses controlled - Continue SS corrections  Acute respiratory failure with hypoxia (HCC) Presented with tachynea, respiratory distress and hypoxia requiring BiPAP.  Due to fluid overload, CHF, pleural effusions.  Procal negative, doubt pneumonia.  Doubt pulmonary fibrosis is contributing.  Weaned to room air.  Aortic stenosis, mild - Defer to Cardiology  Cardiomyopathy, ischemic       Essential hypertension BP soft - Continue metoprolol - Hold home Coreg, Entresto, spironolactone  Mild persistent asthma No wheezing - PRN levalbuterol  Hyperlipidemia with target LDL less than 70 - Continue Crestor  Obesity BMI >30          Subjective: Patient feels well, he is in good spirits.  He has no dyspnea, no pain, no fever, no confusion or respiratory distress.     Physical Exam: BP 106/63 (BP Location: Left Arm)   Pulse 82   Temp 97.6 F (36.4 C) (Oral)   Resp 16  Ht 6' (1.829 m)   Wt 112.7 kg   SpO2 90%   BMI 33.70 kg/m   Elderly adult male, lying in bed, no acute distress RRR, no murmurs, still 1+ shin peripheral edema below his agreeability Respiratory rate seems normal, lung sounds diminished but no rales or wheezes  appreciated Attention normal, affect seems normal, psychomotor slowing is subtle, face symmetric, speech fluent, moves upper extremities with generalized weakness with symmetric strength    Data Reviewed: Discussed with nephrology Basic metabolic panel shows creatinine up to 3.1 Blood count shows hemoglobin 11, no change Coag panel shows SCV O2 60  Family Communication: None present    Disposition: Status is: Inpatient The patient presented with respiratory failure, found to have congestive heart failure and nephrotic syndrome  He has been diuresed adequately  We will discharge today, but his creatinine is trending up, so we will give some albumin and follow this tomorrow.  If creatinine stabilizes or comes down tomorrow, likely discharge to SNF tomorrow with nephrology follow-up, cardiology follow-up, urology follow-up        Author: Alberteen Sam, MD 05/03/2022 3:52 PM  For on call review [email or web address].

## 2022-05-03 NOTE — Progress Notes (Signed)
PROGRESS NOTE    David Huynh  [id number] DOB: 03/07/1941 DOA: 04/26/2022 PCP: [name], MD  David Huynh is an 81 y.o. M with sdCHF EF 45-50%, pAF on Eliquis, obesity, DM, CKD IIIb, HTN and hx amio lung fibrosis who presented with progressive SOB and edema. Wife reported poor compliance with meds. 12/13: Admitted in respiratory distress on BiPAP, Lasix started; Cardiology consulted 12/14: Still on BiPAP; Pulmonology consulted; underwent thoracentesis 12/15: Breathing improved 12/16: Repeat thoracentesis 12/17: Nephrology consulted 12/18: Transitioned to oral diuretics 12/19: Urinary retention, UTI antibiotics started, coude placed 12/20: Cr up again, given albumin  Subjective:   Assessment and Plan:  Acute on chronic combined systolic and diastolic CHF  BL pleural effusions Presented with leg swelling, dyspnea on exertion, CXR showing bilateral opacities and pleural effusions. -ECHO  w/ EF 35-40% in the past, npw 40-45%, mildly reduced RV -s/p Thora-1.5L taken off left lung on 12/14.  Transudate -CXR 12/16 showed large effusions again, repeat bilateral thoracentesis of 2.7L more fluid. -diuresed w/ lasix gtt, metolazone Weight stable, net even on I/Os.  Cr up to 3.1 mg/dL off diuretics  - Diuretics, afterload reduction on hold per Cardiology - [name], MRA on hold with AKI - Continue metoprolol  - D/c Unna boots  Atrial fibrillation with RVR/Atrial tacahycardia Amiodarone contraindicated.   - Continue apixaban, metoprolol per Cardiology  -  per EP, not candidate fot ablation now, FU as outpt  Nephrotic syndrome Acute-on-chronic kidney injury ([place]) Chronic kidney disease stage IIIa baseline 1.6, was 2.5 mg/dL on admission, no improvement with decongestion.   -Cr here up to 3.1 mg/dL in the last two days. -renal foll, serologies for GN sent REnal US negative - Maintain foley for now - po torsemide today  Acute cystitis Developed urinary retention,  urinary urgency 12/18.  Urine culture with E coli. - Continue amoxicillin, day 2 of 5 - Continue new Flomax - I will plan to continue foley at discharge and recommend close Urology follow up for voiding trial, given he required Coude 12/19  Hypoalbuminemia - Ensure  [place] associated Pulmonary fibrosis [facility]) Admitted Apr 2022 with bilateral infiltrates and found to have amio lung toxicity, completed prolonged prednisone taper and was improving, but CT 9/22 still showed amio associated lung fibrosis.  Doubt this is primary driver at this time.  Type 2 diabetes mellitus with stage 3a chronic kidney disease, with long-term current use of insulin (HCC) Glucoses controlled - Continue SS corrections  Acute respiratory failure with hypoxia (HCC) Presented with tachynea, respiratory distress and hypoxia requiring BiPAP.  Due to fluid overload, CHF, pleural effusions.  Procal negative, doubt pneumonia.  Doubt pulmonary fibrosis is contributing.  Weaned to room air.  Aortic stenosis, mild - Defer to Cardiology  Cardiomyopathy, ischemic       Essential hypertension BP soft - Continue metoprolol - Hold home Coreg, Entresto, spironolactone  Mild persistent asthma No wheezing - PRN levalbuterol  Hyperlipidemia with target LDL less than 70 - Continue Crestor  Obesity BMI >30   DVT prophylaxis: apixaban Code Status: DNR Family Communication: Disposition Plan:   Consultants:  CHF tea, REnal  Procedures:   Antimicrobials:    Objective: Vitals:   05/02/22 1951 05/03/22 0450 05/03/22 0905 05/03/22 1007  BP: (!) 92/57 (!) 108/56 (!) 113/59 106/63  Pulse: 75 77 83 82  Resp: (!) 21 (!) 21 18 16   Temp: 98.9 F (37.2 C) 97.6 F (36.4 C) 97.6 F (36.4 C)   TempSrc: Oral Oral Oral  SpO2: 94% 90% 90% 90%  Weight:  112.7 kg    Height:        Intake/Output Summary (Last 24 hours) at 05/03/2022 1736 Last data filed at 05/03/2022 1558 Gross per 24 hour  Intake 902.26 ml   Output 1550 ml  Net -647.74 ml   Filed Weights   05/01/22 0415 05/02/22 0207 05/03/22 0450  Weight: 111.4 kg 112.2 kg 112.7 kg    Examination:     Data Reviewed:   CBC: Recent Labs  Lab 04/29/22 0505 04/30/22 0445 05/01/22 0224 05/02/22 0624 05/03/22 0647  WBC 9.6 7.7 6.4 7.6 10.4  HGB 12.6* 13.0 12.7* 12.3* 11.0*  HCT 39.7 39.5 40.0 38.1* 34.2*  MCV 94.7 91.2 92.8 92.5 93.2  PLT 302 327 317 332 328   Basic Metabolic Panel: Recent Labs  Lab 04/29/22 0505 04/30/22 0445 05/01/22 0224 05/02/22 0624 05/03/22 0647  NA 139 138 135 135 136  K 3.9 3.5 3.7 4.2 4.0  CL 100 99 95* 97* 98  CO2 29 31 30 30 30   GLUCOSE 115* 103* 120* 111* 97  BUN 43* 44* 45* 47* 53*  CREATININE 2.52* 2.35* 2.46* 2.86* 3.09*  CALCIUM 8.1* 7.9* 8.2* 7.8* 7.5*  MG 2.0  --   --   --   --    GFR: Estimated Creatinine Clearance: 24.7 mL/min (A) (by C-G formula based on SCr of 3.09 mg/dL (H)). Liver Function Tests: Recent Labs  Lab 04/27/22 1448  PROT 4.5*   No results for input(s): "LIPASE", "AMYLASE" in the last 168 hours. No results for input(s): "AMMONIA" in the last 168 hours. Coagulation Profile: No results for input(s): "INR", "PROTIME" in the last 168 hours. Cardiac Enzymes: No results for input(s): "CKTOTAL", "CKMB", "CKMBINDEX", "TROPONINI" in the last 168 hours. BNP (last 3 results) No results for input(s): "PROBNP" in the last 8760 hours. HbA1C: No results for input(s): "HGBA1C" in the last 72 hours. CBG: Recent Labs  Lab 05/02/22 1627 05/02/22 1957 05/03/22 0609 05/03/22 1121 05/03/22 1612  GLUCAP 110* 109* 100* 132* 117*   Lipid Profile: No results for input(s): "CHOL", "HDL", "LDLCALC", "TRIG", "CHOLHDL", "LDLDIRECT" in the last 72 hours. Thyroid Function Tests: No results for input(s): "TSH", "T4TOTAL", "FREET4", "T3FREE", "THYROIDAB" in the last 72 hours. Anemia Panel: No results for input(s): "VITAMINB12", "FOLATE", "FERRITIN", "TIBC", "IRON",  "RETICCTPCT" in the last 72 hours. Urine analysis:    Component Value Date/Time   COLORURINE AMBER (A) 05/02/2022 1043   APPEARANCEUR TURBID (A) 05/02/2022 1043   LABSPEC 1.012 05/02/2022 1043   PHURINE 7.0 05/02/2022 1043   GLUCOSEU NEGATIVE 05/02/2022 1043   HGBUR SMALL (A) 05/02/2022 1043   BILIRUBINUR NEGATIVE 05/02/2022 1043   KETONESUR NEGATIVE 05/02/2022 1043   PROTEINUR >=300 (A) 05/02/2022 1043   NITRITE NEGATIVE 05/02/2022 1043   LEUKOCYTESUR LARGE (A) 05/02/2022 1043   Sepsis Labs: @LABRCNTIP (procalcitonin:4,lacticidven:4)  ) Recent Results (from the past 240 hour(s))  Resp panel by RT-PCR (RSV, Flu A&B, Covid) Anterior Nasal Swab     Status: None   Collection Time: 04/26/22 12:57 AM   Specimen: Anterior Nasal Swab  Result Value Ref Range Status   SARS Coronavirus 2 by RT PCR NEGATIVE NEGATIVE Final    Comment: (NOTE) SARS-CoV-2 target nucleic acids are NOT DETECTED.  The SARS-CoV-2 RNA is generally detectable in upper respiratory specimens during the acute phase of infection. The lowest concentration of SARS-CoV-2 viral copies this assay can detect is 138 copies/mL. A negative result does not preclude SARS-Cov-2 infection and  should not be used as the sole basis for treatment or other patient management decisions. A negative result may occur with  improper specimen collection/handling, submission of specimen other than nasopharyngeal swab, presence of viral mutation(s) within the areas targeted by this assay, and inadequate number of viral copies(<138 copies/mL). A negative result must be combined with clinical observations, patient history, and epidemiological information. The expected result is Negative.  Fact Sheet for Patients:  EntrepreneurPulse.com.au  Fact Sheet for Healthcare Providers:  IncredibleEmployment.be  This test is no t yet approved or cleared by the [place] FDA and  has been authorized for  detection and/or diagnosis of SARS-CoV-2 by FDA under an Emergency Use Authorization (EUA). This EUA will remain  in effect (meaning this test can be used) for the duration of the COVID-19 declaration under Section 564(b)(1) of the Act, 21 U.S.C.section 360bbb-3(b)(1), unless the authorization is terminated  or revoked sooner.       Influenza A by PCR NEGATIVE NEGATIVE Final   Influenza B by PCR NEGATIVE NEGATIVE Final    Comment: (NOTE) The Xpert Xpress SARS-CoV-2/FLU/RSV plus assay is intended as an aid in the diagnosis of influenza from Nasopharyngeal swab specimens and should not be used as a sole basis for treatment. Nasal washings and aspirates are unacceptable for Xpert Xpress SARS-CoV-2/FLU/RSV testing.  Fact Sheet for Patients: EntrepreneurPulse.com.au  Fact Sheet for Healthcare Providers: IncredibleEmployment.be  This test is not yet approved or cleared by the [place] FDA and has been authorized for detection and/or diagnosis of SARS-CoV-2 by FDA under an Emergency Use Authorization (EUA). This EUA will remain in effect (meaning this test can be used) for the duration of the COVID-19 declaration under Section 564(b)(1) of the Act, 21 U.S.C. section 360bbb-3(b)(1), unless the authorization is terminated or revoked.     Resp Syncytial Virus by PCR NEGATIVE NEGATIVE Final    Comment: (NOTE) Fact Sheet for Patients: EntrepreneurPulse.com.au  Fact Sheet for Healthcare Providers: IncredibleEmployment.be  This test is not yet approved or cleared by the [place] FDA and has been authorized for detection and/or diagnosis of SARS-CoV-2 by FDA under an Emergency Use Authorization (EUA). This EUA will remain in effect (meaning this test can be used) for the duration of the COVID-19 declaration under Section 564(b)(1) of the Act, 21 U.S.C. section 360bbb-3(b)(1), unless the authorization is  terminated or revoked.  Performed at [facility], [place] [address], [place], [place] [address]   Respiratory (~20 pathogens) panel by PCR     Status: None   Collection Time: 04/27/22 11:09 AM   Specimen: Nasopharyngeal Swab; Respiratory  Result Value Ref Range Status   Adenovirus NOT DETECTED NOT DETECTED Final   Coronavirus 229E NOT DETECTED NOT DETECTED Final    Comment: (NOTE) The Coronavirus on the Respiratory Panel, DOES NOT test for the novel  Coronavirus (2019 nCoV)    Coronavirus HKU1 NOT DETECTED NOT DETECTED Final   Coronavirus NL63 NOT DETECTED NOT DETECTED Final   Coronavirus OC43 NOT DETECTED NOT DETECTED Final   Metapneumovirus NOT DETECTED NOT DETECTED Final   Rhinovirus / Enterovirus NOT DETECTED NOT DETECTED Final   Influenza A NOT DETECTED NOT DETECTED Final   Influenza B NOT DETECTED NOT DETECTED Final   Parainfluenza Virus 1 NOT DETECTED NOT DETECTED Final   Parainfluenza Virus 2 NOT DETECTED NOT DETECTED Final   Parainfluenza Virus 3 NOT DETECTED NOT DETECTED Final   Parainfluenza Virus 4 NOT DETECTED NOT DETECTED Final   Respiratory Syncytial Virus NOT  DETECTED NOT DETECTED Final   Bordetella pertussis NOT DETECTED NOT DETECTED Final   Bordetella Parapertussis NOT DETECTED NOT DETECTED Final   Chlamydophila pneumoniae NOT DETECTED NOT DETECTED Final   Mycoplasma pneumoniae NOT DETECTED NOT DETECTED Final    Comment: Performed at [facility], [place] [address] [place], [place] [address]  Body fluid culture w Gram Stain     Status: None   Collection Time: 04/27/22  1:31 PM   Specimen: Pleural Fluid  Result Value Ref Range Status   Specimen Description FLUID PLEURAL RIGHT  Final   Special Requests NONE  Final   Gram Stain   Final    WBC PRESENT, PREDOMINANTLY MONONUCLEAR NO ORGANISMS SEEN CYTOSPIN SMEAR    Culture   Final    NO GROWTH 3 DAYS Performed at [facility], [place] [address], [place], [place] [address]    Report Status  04/30/2022 FINAL  Final  Urine Culture     Status: Abnormal   Collection Time: 04/30/22  8:08 PM   Specimen: Urine, Clean Catch  Result Value Ref Range Status   Specimen Description URINE, CLEAN CATCH  Final   Special Requests   Final    NONE Performed at [facility], [place] [address], [place], [place] [address]    Culture >=100,000 COLONIES/mL ESCHERICHIA COLI (A)  Final   Report Status 05/02/2022 FINAL  Final   Organism ID, Bacteria ESCHERICHIA COLI (A)  Final      Susceptibility   Escherichia coli - MIC*    AMPICILLIN 8 SENSITIVE Sensitive     CEFAZOLIN 8 SENSITIVE Sensitive     CEFEPIME <=0.12 SENSITIVE Sensitive     CEFTRIAXONE 1 SENSITIVE Sensitive     CIPROFLOXACIN <=0.25 SENSITIVE Sensitive     GENTAMICIN <=1 SENSITIVE Sensitive     IMIPENEM <=0.25 SENSITIVE Sensitive     NITROFURANTOIN <=16 SENSITIVE Sensitive     TRIMETH/SULFA >=320 RESISTANT Resistant     AMPICILLIN/SULBACTAM <=2 SENSITIVE Sensitive     PIP/TAZO <=4 SENSITIVE Sensitive     * >=100,000 COLONIES/mL ESCHERICHIA COLI     Radiology Studies: No results found.   Scheduled Meds:  amoxicillin  500 mg Oral Q12H   apixaban  2.5 mg Oral BID   Chlorhexidine Gluconate Cloth  6 each Topical Daily   feeding supplement  237 mL Oral BID BM   insulin aspart  0-15 Units Subcutaneous TID WC   insulin aspart  0-5 Units Subcutaneous QHS   metoprolol succinate  75 mg Oral BID   multivitamin with minerals  1 tablet Oral Daily   [START ON 05/04/2022] rosuvastatin  10 mg Oral Daily   sodium chloride flush  10-40 mL Intracatheter Q12H   sodium chloride flush  3 mL Intravenous Q12H   tamsulosin  0.4 mg Oral QPC breakfast   Continuous Infusions:  sodium chloride Stopped (05/01/22 0806)   sodium chloride Stopped (05/02/22 1150)     LOS: 6 days    Time spent: 80min    Domenic Polite, MD [facility]   05/03/2022, 5:36 PM

## 2022-05-03 NOTE — Progress Notes (Signed)
Rounding Note    Patient Name: David Huynh Date of Encounter: 05/03/2022  [place] Cardiologist: Loralie Champagne, MD   Subjective   Again, feeling so much better.  Would not necessarily want to pursue a procedure while here, hopes to go home soon.  Inpatient Medications    Scheduled Meds:  amoxicillin  500 mg Oral Q12H   apixaban  2.5 mg Oral BID   Chlorhexidine Gluconate Cloth  6 each Topical Daily   feeding supplement  237 mL Oral BID BM   insulin aspart  0-15 Units Subcutaneous TID WC   insulin aspart  0-5 Units Subcutaneous QHS   metoprolol succinate  75 mg Oral BID   multivitamin with minerals  1 tablet Oral Daily   rosuvastatin  40 mg Oral Daily   sodium chloride flush  10-40 mL Intracatheter Q12H   sodium chloride flush  3 mL Intravenous Q12H   tamsulosin  0.4 mg Oral QPC breakfast   Continuous Infusions:  sodium chloride Stopped (05/01/22 0806)   sodium chloride Stopped (05/02/22 1150)   PRN Meds: sodium chloride, acetaminophen, hydrALAZINE, labetalol, levalbuterol, ondansetron (ZOFRAN) IV, sodium chloride flush, sodium chloride flush   Vital Signs    Vitals:   05/02/22 1951 05/03/22 0450 05/03/22 0905 05/03/22 1007  BP: (!) 92/57 (!) 108/56 (!) 113/59 106/63  Pulse: 75 77 83 82  Resp: (!) 21 (!) _0 Temp: 98.9 F (37.2 C) 97.6 F (36.4 C) 97.6 F (36.4 C)   TempSrc: Oral Oral Oral   SpO2: 94% 90% 90% 90%  Weight:  112.7 kg    Height:        Intake/Output Summary (Last 24 hours) at 05/03/2022 1048 Last data filed at 05/03/2022 1014 Gross per 24 hour  Intake 700 ml  Output 1550 ml  Net -850 ml      05/03/2022    4:50 AM 05/02/2022    2:07 AM 05/01/2022    4:15 AM  Last 3 Weights  Weight (lbs) 248 lb 7.3 oz 247 lb 5.7 oz 245 lb 9.5 oz  Weight (kg) 112.7 kg 112.2 kg 111.4 kg      Telemetry    SR 70's- 80's less PATs 120's  - Personally Reviewed  ECG    No new EKGs - Personally Reviewed  Physical Exam    GEN: No acute distress.   Neck: No JVD Cardiac: RRR, no murmurs, rubs, or gallops.  Respiratory: diminished at the bases, ery soft crackles. GI: Soft, nontender, non-distended  MS: Unna boots on, still some edema, No deformity. Neuro:  Nonfocal  Psych: Normal affect   Labs    High Sensitivity Troponin:   Recent Labs  Lab 04/26/22 0100 04/26/22 0306  TROPONINIHS 84* 98*     Chemistry Recent Labs  Lab 04/27/22 1448 04/28/22 0540 04/29/22 0505 04/30/22 0445 05/01/22 0224 05/02/22 0624 05/03/22 0647  NA  --    < > 139   < > 135 135 136  K  --    < > 3.9   < > 3.7 4.2 4.0  CL  --    < > 100   < > 95* 97* 98  CO2  --    < > 29   < > _1 GLUCOSE  --    < > 115*   < > 120* 111* 97  BUN  --    < > 43*   < > 45* 47* 53*  CREATININE  --    < >  2.52*   < > 2.46* 2.86* 3.09*  CALCIUM  --    < > 8.1*   < > 8.2* 7.8* 7.5*  MG  --   --  2.0  --   --   --   --   PROT 4.5*  --   --   --   --   --   --   GFRNONAA  --    < > 25*   < > 26* 22* 20*  ANIONGAP  --    < > 10   < > _0 < > = values in this interval not displayed.    Lipids No results for input(s): "CHOL", "TRIG", "HDL", "LABVLDL", "LDLCALC", "CHOLHDL" in the last 168 hours.  Hematology Recent Labs  Lab 05/01/22 0224 05/02/22 0624 05/03/22 0647  WBC 6.4 7.6 10.4  RBC 4.31 4.12* 3.67*  HGB 12.7* 12.3* 11.0*  HCT 40.0 38.1* 34.2*  MCV 92.8 92.5 93.2  MCH 29.5 29.9 30.0  MCHC 31.8 32.3 32.2  RDW 14.9 15.2 15.3  PLT 317 332 328   Thyroid No results for input(s): "TSH", "FREET4" in the last 168 hours.  BNP No results for input(s): "BNP", "PROBNP" in the last 168 hours.   DDimer No results for input(s): "DDIMER" in the last 168 hours.   Radiology    No results found.  Cardiac Studies   04/26/22; TTE 1. Vmax 2.5 m/s, MG 17 mmHG, AVA 1.50 cm2, DI 0.34. Suspect gradients  lower than expected due to reduced EF. Based on AVA and DI, AS is  moderate. The aortic valve is tricuspid. There is severe  calcifcation of  the aortic valve. There is severe thickening   of the aortic valve. Aortic valve regurgitation is not visualized.  Moderate aortic valve stenosis. Aortic valve area, by VTI measures 1.50  cm. Aortic valve mean gradient measures 17.0 mmHg. Aortic valve Vmax  measures 2.45 m/s.   2. Left ventricular ejection fraction, by estimation, is 40 to 45%. The  left ventricle has mildly decreased function. The left ventricle  demonstrates global hypokinesis. Left ventricular diastolic function could  not be evaluated.   3. Right ventricular systolic function is mildly reduced. The right  ventricular size is mildly enlarged. Tricuspid regurgitation signal is  inadequate for assessing PA pressure.   4. Left atrial size was mildly dilated.   5. Moderate pleural effusion in the left lateral region.   6. The mitral valve is grossly normal. Mild mitral valve regurgitation.  No evidence of mitral stenosis.   7. The inferior vena cava is dilated in size with <50% respiratory  variability, suggesting right atrial pressure of 15 mmHg.   Comparison(s): Changes from prior study are noted. EF 40-45% on this  study. In Afib with RVR. Suspect moderate AS.    03/02/2020: R/LHC RA mean 2 RV 33/3 PA 29/4, mean 17 PCWP mean 6 LV 164/8 AO 148/74  Mean aortic valve gradient 14.6 mmHg, peak-to-peak 17 mmHg.   Oxygen saturations: PA 71% AO 98%  Cardiac Output (Fick) 6.59  Cardiac Index (Fick) 2.66   Left Main  No significant disease.    Left Anterior Descending  80% proximal LAD stenosis just distal to small D1 and at site of take-off of moderate septal perforator. Long up to 70% prox/mid LAD stenosis distal to D2. 50% stenosis mid/distal LAD.    Left Circumflex  Patent proximal LCx stent. Large OM2 branches early. The superior branch has 80% ostial stenosis,  inferior branch with 60% ostial stenosis.    Right Coronary Artery  Mid RCA stent patent. 40% stenosis proximal PDA, 50%  stenosis mid PDA.      10/26/2020: EPS/Ablation CONCLUSIONS: 1. Atrial fibrillation upon presentation.   2. Intracardiac echo reveals a moderate to large sized left atrium. 3. Successful electrical isolation and anatomical encircling of all four pulmonary veins with radiofrequency current. 4. Additional mapping and ablation within the left atrium due to persistence of atrial fibrillation with a posterior wall box demonstrated  5. Atrial fibrillation successfully cardioverted to sinus rhythm. 6. Cavo-tricuspid isthmus ablation performed with complete bidirectional isthmus block achieved 7. No inducible arrhythmias following ablation 8. No early apparent complications. Patient Profile     81 y.o. male CAD (PCI to RCA 2017, LAD 1572), chronic systolic CHF (with waxing/waning EF's over the years), HTN, HLD, DM, CKD 3, AFib, and asthma   Amiodarone started March 2022 for AFib RVR > developed acute amiodarone lung toxicity with elevated ESR and patchy bilateral infiltrates. And stopped  Out patient follow up is hit/miss with periods of getting lost to follow up  October 26, 2020 PVI/CTI ablation (Dr. Rayann Heman)  Admitted with 04/26/22 with 3 weeks of progressive SOB, edema found in acute/chronic CHF  EP asked on board for AFib/RVR, saw by Jonni Sanger and Dr. Myles Gip 12/13,, not felt a tikosyn candidate with renal disease, was markedly volume OL and not felt to have any AAD options not procedural candidate.  12/14/ LEFT thora 1.5L 12/16 thora 1149m LEFT and [id number] the RIGHT  Assessment & Plan    AFib AFlutter CHA2DS2Vasc is 6, on Eliquis (dose reduced for renal function/age) S/p PVI/CTI ablation June 2022 w/Dr. Allred 3.   PATs  EP initially consulted 04/26/22 for AFib w/RVR, at that time not felt to have AAD option and not a procedural candidate acutely given marked volume OL/CHF and recommended to f/u outpt to  revisit perhaps ablation candidacy  EP asked back on board to revisit ablation  given long recurrent epiosdes of tachycardia despite excellent diuresis and improving clinical status. Noted with PATs and recommended to continue to titrate his BB as able.  Agree no good AAD option for him With BB titration his tachycardia burden is much improved. Toprol 7570mID currently  EP checking back in after further d/w CHF team to see if he may be an ablation candidate for the AT (out patient) Dr. MeaMyles Gipll see later today Out patient follow up is in place  Acute/chronic CHF exacerbation  ICM D/w Dr. McLAundra Dubinarked diuresis Neg -[id number]/p b/l thoras as well (transudates) Volume status is significantly  improved  AKI/CKD (III) Creat 2.46 (baseline in the 1.4-1.5  range probably) Nephrology on board  "Proteinuria/ renal failure could be a diabetic nephropathy, post-infectious GN related to recent dental infection, nsaid-related vs other " "a/c combined CHF and/or nephrotic syndrome/ CKD"  Worsened Creat with urinary retention issues/obstruction  C/w HF and nephrology teams  CAD Not an ACS C/w HF team  7.  VHD w/AS moderate  For questions or updates, please contact [facility] consult [email or web address] for contact info under        Signed, ReneBaldwin Jamaica-C  05/03/2022, 10:48 AM

## 2022-05-03 NOTE — Plan of Care (Signed)
  Problem: Coping: Goal: Ability to adjust to condition or change in health will improve Outcome: Progressing   Problem: Fluid Volume: Goal: Ability to maintain a balanced intake and output will improve Outcome: Progressing   Problem: Health Behavior/Discharge Planning: Goal: Ability to identify and utilize available resources and services will improve Outcome: Progressing   

## 2022-05-04 DIAGNOSIS — I5043 Acute on chronic combined systolic (congestive) and diastolic (congestive) heart failure: Secondary | ICD-10-CM | POA: Diagnosis not present

## 2022-05-04 LAB — GLUCOSE, CAPILLARY
Glucose-Capillary: 97 mg/dL (ref 70–99)
Glucose-Capillary: 111 mg/dL — ABNORMAL HIGH (ref 70–99)
Glucose-Capillary: 118 mg/dL — ABNORMAL HIGH (ref 70–99)
Glucose-Capillary: 146 mg/dL — ABNORMAL HIGH (ref 70–99)

## 2022-05-04 LAB — BASIC METABOLIC PANEL
Anion gap: 8 (ref 5–15)
BUN: 50 mg/dL — ABNORMAL HIGH (ref 8–23)
CO2: 28 mmol/L (ref 22–32)
Calcium: 7.2 mg/dL — ABNORMAL LOW (ref 8.9–10.3)
Chloride: 98 mmol/L (ref 98–111)
Creatinine, Ser: 3.03 mg/dL — ABNORMAL HIGH (ref 0.61–1.24)
GFR, Estimated: 20 mL/min — ABNORMAL LOW (ref >60)
Glucose, Bld: 97 mg/dL (ref 70–99)
Potassium: 3.3 mmol/L — ABNORMAL LOW (ref 3.5–5.1)
Sodium: 134 mmol/L — ABNORMAL LOW (ref 135–145)

## 2022-05-04 LAB — CBC
HCT: 35.1 % — ABNORMAL LOW (ref 39.0–52.0)
Hemoglobin: 11 g/dL — ABNORMAL LOW (ref 13.0–17.0)
MCH: 29.6 pg (ref 26.0–34.0)
MCHC: 31.3 g/dL (ref 30.0–36.0)
MCV: 94.4 fL (ref 80.0–100.0)
Platelets: 322 10*3/uL (ref 150–400)
RBC: 3.72 MIL/uL — ABNORMAL LOW (ref 4.22–5.81)
RDW: 15.1 % (ref 11.5–15.5)
WBC: 8.6 10*3/uL (ref 4.0–10.5)
nRBC: 0 % (ref 0.0–0.2)

## 2022-05-04 LAB — COOXEMETRY PANEL
Carboxyhemoglobin: 1.6 % — ABNORMAL HIGH (ref 0.5–1.5)
Methemoglobin: <0.7 % (ref 0.0–1.5)
O2 Saturation: 62.5 %
Total hemoglobin: 11.4 g/dL — ABNORMAL LOW (ref 12.0–16.0)

## 2022-05-04 LAB — RESP PANEL BY RT-PCR (RSV, FLU A&B, COVID)  RVPGX2
Influenza A by PCR: POSITIVE — AB
Influenza B by PCR: NEGATIVE
Resp Syncytial Virus by PCR: NEGATIVE
SARS Coronavirus 2 by RT PCR: NEGATIVE

## 2022-05-04 MED ORDER — TORSEMIDE 20 MG PO TABS: 20.0000 mg | ORAL_TABLET | Freq: Every day | ORAL | Status: DC

## 2022-05-04 MED ORDER — OSELTAMIVIR PHOSPHATE 30 MG PO CAPS
30.0000 mg | ORAL_CAPSULE | Freq: Every day | ORAL | Status: AC
  Administered 2022-05-04 – 2022-05-08 (×5): 30 mg via ORAL
  Filled 2022-05-04 (×5): qty 1

## 2022-05-04 MED ORDER — POTASSIUM CHLORIDE CRYS ER 20 MEQ PO TBCR
20.0000 meq | EXTENDED_RELEASE_TABLET | Freq: Every day | ORAL | Status: DC
  Administered 2022-05-05 – 2022-05-08 (×4): 20 meq via ORAL
  Filled 2022-05-04 (×4): qty 1

## 2022-05-04 MED ORDER — LORATADINE 10 MG PO TABS
10.0000 mg | ORAL_TABLET | Freq: Once | ORAL | Status: AC | PRN
  Administered 2022-05-04: 10 mg via ORAL
  Filled 2022-05-04: qty 1

## 2022-05-04 MED ORDER — POTASSIUM CHLORIDE CRYS ER 20 MEQ PO TBCR
40.0000 meq | EXTENDED_RELEASE_TABLET | Freq: Once | ORAL | Status: AC
  Administered 2022-05-04: 40 meq via ORAL
  Filled 2022-05-04: qty 2

## 2022-05-04 NOTE — Progress Notes (Addendum)
Patient ID: David Huynh, male   DOB: 06-15-1940, 81 y.o.   MRN: [id number]   Advanced Heart Failure Rounding Note  PCP-Cardiologist: [name], MD   Subjective:    Coude cath remains in place.  BUN/creatinine have stabilized.  CVP 10 this morning with co-ox 63%.   E coli UTI, on amoxicillin.   Telemetry shows short runs of AT, mostly NSR.   No dyspnea at rest.  Objective:   Weight Range: 111 kg Body mass index is 33.19 kg/m.   Vital Signs:   Temp:  [98.2 F (36.8 C)-98.6 F (37 C)] 98.6 F (37 C) (12/21 0450) Pulse Rate:  [82] 82 (12/21 0450) Resp:  [16-20] 20 (12/21 0450) BP: (100-123)/(62-72) 114/62 (12/21 0931) SpO2:  [90 %-92 %] 90 % (12/21 0450) Weight:  [111 kg] 111 kg (12/21 0450) Last BM Date : 05/02/22  Weight change: Filed Weights   05/02/22 0207 05/03/22 0450 05/04/22 0450  Weight: 112.2 kg 112.7 kg 111 kg    Intake/Output:   Intake/Output Summary (Last 24 hours) at 05/04/2022 0937 Last data filed at 05/04/2022 0454 Gross per 24 hour  Intake 662.26 ml  Output 1400 ml  Net -737.74 ml      Physical Exam    General: NAD Neck: JVP 8-9 cm, no thyromegaly or thyroid nodule.  Lungs: Clear to auscultation bilaterally with normal respiratory effort. CV: Nondisplaced PMI.  Heart regular S1/S2, no S3/S4, 2/6 SEM RUSB.  Trace edema.   Abdomen: Soft, nontender, no hepatosplenomegaly, no distention.  Skin: Intact without lesions or rashes.  Neurologic: Alert and oriented x 3.  Psych: Normal affect. Extremities: No clubbing or cyanosis.  HEENT: Normal.   Telemetry   NSR 80s with short AT runs (personally reviewed).   EKG    N/A   Labs    CBC Recent Labs    05/03/22 0647 05/04/22 0552  WBC 10.4 8.6  HGB 11.0* 11.0*  HCT 34.2* 35.1*  MCV 93.2 94.4  PLT 328 322   Basic Metabolic Panel Recent Labs    23/30/07 0647 05/04/22 0552  NA 136 134*  K 4.0 3.3*  CL 98 98  CO2 30 28  GLUCOSE 97 97  BUN 53* 50*  CREATININE 3.09*  3.03*  CALCIUM 7.5* 7.2*   Liver Function Tests No results for input(s): "AST", "ALT", "ALKPHOS", "BILITOT", "PROT", "ALBUMIN" in the last 72 hours. No results for input(s): "LIPASE", "AMYLASE" in the last 72 hours. Cardiac Enzymes No results for input(s): "CKTOTAL", "CKMB", "CKMBINDEX", "TROPONINI" in the last 72 hours.  BNP: BNP (last 3 results) Recent Labs    04/26/22 0100  BNP 1,368.4*    ProBNP (last 3 results) No results for input(s): "PROBNP" in the last 8760 hours.   D-Dimer No results for input(s): "DDIMER" in the last 72 hours. Hemoglobin A1C No results for input(s): "HGBA1C" in the last 72 hours.  Fasting Lipid Panel No results for input(s): "CHOL", "HDL", "LDLCALC", "TRIG", "CHOLHDL", "LDLDIRECT" in the last 72 hours. Thyroid Function Tests No results for input(s): "TSH", "T4TOTAL", "T3FREE", "THYROIDAB" in the last 72 hours.  Invalid input(s): "FREET3"  Other results:   Imaging    No results found.   Medications:     Scheduled Medications:  amoxicillin  500 mg Oral Q12H   apixaban  2.5 mg Oral BID   Chlorhexidine Gluconate Cloth  6 each Topical Daily   feeding supplement  237 mL Oral BID BM   insulin aspart  0-15 Units Subcutaneous TID WC  insulin aspart  0-5 Units Subcutaneous QHS   metoprolol succinate  75 mg Oral BID   multivitamin with minerals  1 tablet Oral Daily   rosuvastatin  10 mg Oral Daily   sodium chloride flush  10-40 mL Intracatheter Q12H   sodium chloride flush  3 mL Intravenous Q12H   tamsulosin  0.4 mg Oral QPC breakfast    Infusions:  sodium chloride Stopped (05/01/22 0806)   sodium chloride Stopped (05/02/22 1150)    PRN Medications: sodium chloride, acetaminophen, hydrALAZINE, labetalol, levalbuterol, ondansetron (ZOFRAN) IV, sodium chloride flush, sodium chloride flush    Patient Profile   81 y.o. male with a history of CAD, chronic systolic CHF, HTN, HLD, DM, CKD 3, asthma, and PAF w/ h/o amiodarone lung  toxicity, admitted w/ a/c CHF and recurrent AF w/ RVR.   Assessment/Plan   1. Acute on chronic CHF: Prior ischemic cardiomyopathy, improved after PCI in 2017.  Echo 03/2018 with EF back down to 35-40% and moderate-severe RV dysfunction. This was in the setting of atrial fibrillation with RVR. EF 40% on TEE 04/2018. Echo in 3/20 showed EF up to 50%.  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  Echo in 7/21 with EF 25-30%, severe RV dysfunction.  TEE in 10/21, however, showed EF up to 50%.  He had PCI to LAD in 10/21. Echo in 3/22 showed stable EF 50-55% with normal RV.   Echo in 8/22 with EF 45-50%, moderate LVH, normal RV, moderate AS.  He returns with marked volume overload in setting of atrial tachycardia with RVR.  No evidence for ACS, suspect exacerbation may be due to atrial tachy/RVR. He had been lost to follow-up. Echo this admit EF 40-45%, RV mildly reduced, moderate AS. PICC placed. Initially with marked peripheral edema and oxygen requirement. Excellent UOP with lasix gtt at 15/hr and metolazone.  Weight down significantly.  CVP is 10 today with co-ox 63%, creatinine has stabilized at 3.03.  Management is complicated by nephrotic syndrome which will give him a tendency towards significant peripheral edema.  - With CVP rising, restart diuretic.  Will use torsemide 20 mg daily.  - Off Hydralazine/Imdur with soft BP  - Would like eventual SGLT2 inhibitor but has UTI and elevated creatinine so not now.  2. 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  Had successful DC-CV on 03/29/18 but then back in atrial fibrillation. Ranolazine added, but failed DCCV again 04/02/18. DCCV 04/19/18 was successful.  AF with RVR at 3/22 admission, amiodarone restarted and he was cardioverted to NSR, but he developed amiodarone lung toxicity.  Amiodarone was stopped. Now s/p atrial fibrillation ablation in 6/22.  At admission, he was in atrial  tachycardia with RVR, not sure how long. Suspect this triggered recurrent CHF.  He does not appear to still be on ranolazine at home. Over the weekend, he alternated between NSR and atrial tachy with rate in 120s.  Now he is mostly in NSR with short AT runs.  - No amiodarone with history of lung toxicity, anti-arrhythmic options limited as he also has AKI on CKD stage 3.    - Not Tikosyn candidate with CKD.  - Continue Eliquis 2.5 mg bid.  - Continue Toprol XL 75 mg bid to try to control AT (on Coreg at home).  - Will followup with EP as outpatient for consideration of ablation.    3. AKI on CKD stage 3: Creatinine baseline in past (1 year ago) was  around 1.6.   Patient has nephrotic syndrome with 7.7 g/day proteinuria and low albumen.  Suspect this is from diabetic nephropathy though he also uses NSAIDs frequently. Creatinine 2.4 => 2.86 => 3.09 => 3.03 in setting of urinary obstruction and diuresis.  Renal function appears to have stabilized. - Start torsemide 20 mg daily as above.  - Hold Entresto, spironolactone (not totally clear that he was taking at home).  - Nephrology following, will need to see them in followup.  4. CAD: History of DES to RCA in 9/17.  Underwent LHC 04/03/18 with PCI to prox left circ. LHC in 10/21 with PCI to LAD.  No chest pain this admission.  Mild elevation in TnI but no trend, doubt ACS.  - He is on Eliquis, so no ASA.   - Continue statin.  - Defer cath with elevated creatinine and no ACS.  5. Aortic stenosis: Moderate on 8/22 echo. Moderate on echo this admit, AVA 1.50 cm2, DI 0.34, mean gradient 17 mmHG.  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  He improved with steroids in the past.  7. Pleural effusion: Thoracentesis x 2 on left and x 1 on right this admission.  Transudates, due to CHF/nephrotic syndrome.  8. BPH/urinary obstruction: Coude catheter placed, continue Flomax. He will go to facility with foley in place and should followup with urology.  9.  UTI: E coli UTI. - Amoxicillin.  - No SGLT2i for now  Disposition: Ok for SNF today.  Followup CHF clinic.  Cardiac meds for discharge: torsemide 20 mg daily, KCl 20 mEq daily, apixaban 2.5 bid, Toprol XL 75 mg bid, Crestor 10 mg daily.   [name]  05/04/2022 9:37 AM

## 2022-05-04 NOTE — Progress Notes (Signed)
[facility] Progress Note   Assessment/ Plan:   AKI on CKD 3b - b/l creat 1.5- 1.7 from November 2022. Creat here 2.5 in setting of decompensated CHF/ anasarca, creat hasn't improved much w/ sig diuresis. UA looks infected w/ wbc/rbc's and large protein. Renal US w/o hydro. Work-up shows sig proteinuria 7.7 gm. Has hx DM2 but w/o extensive diabetic complications. +excess nsaid use recently also. 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 Will send serologies- all pending except for complements which are normal. Good UOP on IV lasix gtt per cardiology, has been transitioned to PO torsemide- held today.  - Cr stabilized- has Coude- OK to d/c from nephrology standpoint Vol overload/ anasarca - a/c combined CHF and/or nephrotic syndrome/ CKD.  Alb 1.8 here on admission. Diuretics ongoing and is down 10kg from admission w/ sig edema remaining.  Afib / RVR - not amio candidate, on BB and eliquis' Amio - associated pulm fibrosis HTN - meds as above, BP's good Obesity - BMI > 30 Pyuria - looks like UTI, on ceftriaxone Acute urinary retention- Foley in place    Subjective:    Cr stable.  Says he would like an inhaler- I d/w respiratory   Objective:   BP 114/62   Pulse (!) 108   Temp 98.6 F (37 C) (Oral)   Resp 18   Ht 6' (1.829 m)   Wt 111 kg   SpO2 96%   BMI 33.19 kg/m   Intake/Output Summary (Last 24 hours) at 05/04/2022 1200 Last data filed at 05/04/2022 0454 Gross per 24 hour  Intake 562.26 ml  Output 950 ml  Net -387.74 ml   Weight change: -1.7 kg  Physical Exam: Gen:NAD, sitting in chair CVS: RRR Resp: clear bilaterally no c/w/r Abd: soft, nontender, NABS Ext: 2+ LE edema but with improvement  Imaging: No results found.  Labs: BMET Recent Labs  Lab 04/28/22 0540 04/29/22 0505 04/30/22 0445 05/01/22 0224 05/02/22 0624 05/03/22 0647 05/04/22 0552  NA  138 139 138 135 135 136 134*  K 3.9 3.9 3.5 3.7 4.2 4.0 3.3*  CL 104 100 99 95* 97* 98 98  CO2 27 29 31 30 30 30 28   GLUCOSE 140* 115* 103* 120* 111* 97 97  BUN 45* 43* 44* 45* 47* 53* 50*  CREATININE 2.48* 2.52* 2.35* 2.46* 2.86* 3.09* 3.03*  CALCIUM 7.8* 8.1* 7.9* 8.2* 7.8* 7.5* 7.2*   CBC Recent Labs  Lab 05/01/22 0224 05/02/22 0624 05/03/22 0647 05/04/22 0552  WBC 6.4 7.6 10.4 8.6  HGB 12.7* 12.3* 11.0* 11.0*  HCT 40.0 38.1* 34.2* 35.1*  MCV 92.8 92.5 93.2 94.4  PLT 317 332 328 322    Medications:     amoxicillin  500 mg Oral Q12H   apixaban  2.5 mg Oral BID   Chlorhexidine Gluconate Cloth  6 each Topical Daily   feeding supplement  237 mL Oral BID BM   insulin aspart  0-15 Units Subcutaneous TID WC   insulin aspart  0-5 Units Subcutaneous QHS   metoprolol succinate  75 mg Oral BID   multivitamin with minerals  1 tablet Oral Daily   rosuvastatin  10 mg Oral Daily   sodium chloride flush  10-40 mL Intracatheter Q12H   sodium chloride flush  3 mL Intravenous Q12H   tamsulosin  0.4 mg Oral QPC breakfast    05/06/22, MD 05/04/2022, 12:00 PM

## 2022-05-04 NOTE — Progress Notes (Signed)
Orthopedic Tech Progress Note Patient Details:  David Huynh 02/20/1941 [id number]  Ortho Devices Type of Ortho Device: Radio broadcast assistant Ortho Device/Splint Location: bi-lateral Ortho Device/Splint Interventions: Ordered, Application, Adjustment  No obvious wounds. Some discoloration from where there may have been wounds previously.  Post Interventions Patient Tolerated: Well Instructions Provided: Care of device, Adjustment of device  [name] 05/04/2022, 9:16 PM

## 2022-05-04 NOTE — Progress Notes (Signed)
Palliative-   Chart reviewed- noted patient stabilizing per nephrology and cardiology, cleared to d/c to SNF.  Goals of care have been clarified as DNR with limited medical interventions.  Palliative will sign off for now- please reconsult if needed.   Ocie Bob, AGNP-C Palliative Medicine  No charge

## 2022-05-05 ENCOUNTER — Inpatient Hospital Stay (HOSPITAL_COMMUNITY): Payer: Medicare Other

## 2022-05-05 DIAGNOSIS — I5043 Acute on chronic combined systolic (congestive) and diastolic (congestive) heart failure: Secondary | ICD-10-CM | POA: Diagnosis not present

## 2022-05-05 LAB — GLUCOSE, CAPILLARY
Glucose-Capillary: 109 mg/dL — ABNORMAL HIGH (ref 70–99)
Glucose-Capillary: 118 mg/dL — ABNORMAL HIGH (ref 70–99)
Glucose-Capillary: 183 mg/dL — ABNORMAL HIGH (ref 70–99)
Glucose-Capillary: 202 mg/dL — ABNORMAL HIGH (ref 70–99)

## 2022-05-05 LAB — CBC
HCT: 36.8 % — ABNORMAL LOW (ref 39.0–52.0)
Hemoglobin: 12 g/dL — ABNORMAL LOW (ref 13.0–17.0)
MCH: 30.4 pg (ref 26.0–34.0)
MCHC: 32.6 g/dL (ref 30.0–36.0)
MCV: 93.2 fL (ref 80.0–100.0)
Platelets: 349 10*3/uL (ref 150–400)
RBC: 3.95 MIL/uL — ABNORMAL LOW (ref 4.22–5.81)
RDW: 14.8 % (ref 11.5–15.5)
WBC: 8.8 10*3/uL (ref 4.0–10.5)
nRBC: 0 % (ref 0.0–0.2)

## 2022-05-05 LAB — MAGNESIUM: Magnesium: 2.4 mg/dL (ref 1.7–2.4)

## 2022-05-05 LAB — BASIC METABOLIC PANEL
Anion gap: 8 (ref 5–15)
BUN: 51 mg/dL — ABNORMAL HIGH (ref 8–23)
CO2: 28 mmol/L (ref 22–32)
Calcium: 7.8 mg/dL — ABNORMAL LOW (ref 8.9–10.3)
Chloride: 101 mmol/L (ref 98–111)
Creatinine, Ser: 3.14 mg/dL — ABNORMAL HIGH (ref 0.61–1.24)
GFR, Estimated: 19 mL/min — ABNORMAL LOW (ref >60)
Glucose, Bld: 117 mg/dL — ABNORMAL HIGH (ref 70–99)
Potassium: 3.8 mmol/L (ref 3.5–5.1)
Sodium: 137 mmol/L (ref 135–145)

## 2022-05-05 LAB — POTASSIUM: Potassium: 3.9 mmol/L (ref 3.5–5.1)

## 2022-05-05 LAB — COOXEMETRY PANEL
Carboxyhemoglobin: 1.9 % — ABNORMAL HIGH (ref 0.5–1.5)
Methemoglobin: <0.7 % (ref 0.0–1.5)
O2 Saturation: 69.7 %
Total hemoglobin: 12 g/dL (ref 12.0–16.0)

## 2022-05-05 MED ORDER — IPRATROPIUM-ALBUTEROL 0.5-2.5 (3) MG/3ML IN SOLN
3.0000 mL | Freq: Four times a day (QID) | RESPIRATORY_TRACT | Status: DC
  Administered 2022-05-05 – 2022-05-06 (×4): 3 mL via RESPIRATORY_TRACT
  Filled 2022-05-05 (×5): qty 3

## 2022-05-05 MED ORDER — AMOXICILLIN 500 MG PO CAPS: 500.0000 mg | ORAL_CAPSULE | Freq: Two times a day (BID) | ORAL | 0 refills | Status: DC

## 2022-05-05 MED ORDER — LANTUS SOLOSTAR 100 UNIT/ML ~~LOC~~ SOPN: 6.0000 [IU] | PEN_INJECTOR | Freq: Every day | SUBCUTANEOUS | 0 refills | Status: DC

## 2022-05-05 MED ORDER — POTASSIUM CHLORIDE CRYS ER 20 MEQ PO TBCR: 20.0000 meq | EXTENDED_RELEASE_TABLET | ORAL | Status: AC

## 2022-05-05 MED ORDER — METOPROLOL SUCCINATE ER 25 MG PO TB24: 75.0000 mg | ORAL_TABLET | Freq: Two times a day (BID) | ORAL | Status: AC

## 2022-05-05 MED ORDER — APIXABAN 2.5 MG PO TABS: 2.5000 mg | ORAL_TABLET | Freq: Two times a day (BID) | ORAL | Status: AC

## 2022-05-05 MED ORDER — TAMSULOSIN HCL 0.4 MG PO CAPS: 0.4000 mg | ORAL_CAPSULE | Freq: Every day | ORAL | Status: DC

## 2022-05-05 MED ORDER — TORSEMIDE 20 MG PO TABS: 20.0000 mg | ORAL_TABLET | ORAL | Status: DC

## 2022-05-05 MED ORDER — LORATADINE 10 MG PO TABS: 10.0000 mg | ORAL_TABLET | Freq: Every day | ORAL | Status: AC

## 2022-05-05 MED ORDER — PREDNISONE 20 MG PO TABS
40.0000 mg | ORAL_TABLET | Freq: Every day | ORAL | Status: DC
  Administered 2022-05-05 – 2022-05-07 (×3): 40 mg via ORAL
  Filled 2022-05-05 (×3): qty 2

## 2022-05-05 MED ORDER — PREDNISONE 20 MG PO TABS: 20.0000 mg | ORAL_TABLET | Freq: Every day | ORAL | Status: DC

## 2022-05-05 MED ORDER — OSELTAMIVIR PHOSPHATE 30 MG PO CAPS: 30.0000 mg | ORAL_CAPSULE | Freq: Every day | ORAL | 0 refills | Status: DC

## 2022-05-05 MED ORDER — LORATADINE 10 MG PO TABS
10.0000 mg | ORAL_TABLET | Freq: Every day | ORAL | Status: DC
  Administered 2022-05-05 – 2022-05-08 (×4): 10 mg via ORAL
  Filled 2022-05-05 (×4): qty 1

## 2022-05-05 NOTE — TOC Progression Note (Signed)
Transition of Care [facility]) - Progression Note    Patient Details  Name: David Huynh MRN: [id number] Date of Birth: Mar 14, 1941  Transition of Care [facility]) CM/SW Contact  Erin Sons, [place] Phone Number: 05/05/2022, 3:33 PM  Clinical Narrative:     Pt can DC to [facility] tomorrow.   Expected Discharge Plan: Skilled Nursing Facility Barriers to Discharge: Continued Medical Work up  Expected Discharge Plan and Services   Discharge Planning Services: CM Consult Post Acute Care Choice: Skilled Nursing Facility Living arrangements for the past 2 months: Independent Living Facility                                       Social Determinants of Health (SDOH) Interventions SDOH Screenings   Food Insecurity: No Food Insecurity (05/01/2022)  Housing: Low Risk  (05/01/2022)  Transportation Needs: No Transportation Needs (05/01/2022)  Utilities: Not At Risk (05/01/2022)  Depression (PHQ2-9): Low Risk  (11/08/2020)  Financial Resource Strain: Low Risk  (05/01/2022)  Tobacco Use: Low Risk  (04/26/2022)    Readmission Risk Interventions    08/20/2020   11:37 AM  Readmission Risk Prevention Plan  Transportation Screening Complete  PCP or Specialist Appt within 3-5 Days Complete  HRI or Home Care Consult Complete  Social Work Consult for Recovery Care Planning/Counseling Complete  Palliative Care Screening Not Applicable  Medication Review Oceanographer) Complete

## 2022-05-05 NOTE — Progress Notes (Signed)
Patient had 2 occurrences of an 8 beat run of vtach while asleep. Patient asymptomatic. Vitals WNL. Cardiology and [facility] paged. See new orders.

## 2022-05-05 NOTE — Progress Notes (Signed)
Pt refuses to wear Bipap QHS currently on room air and stable. No active distress noted. Will continue to monitor

## 2022-05-05 NOTE — Discharge Summary (Addendum)
Physician Discharge Summary  David Huynh [id number] DOB: [birth year] DOA: 04/26/2022  PCP: [name], MD  Admit date: 04/26/2022 Discharge date: 05/06/2022  Time spent: 45 minutes  Recommendations for Outpatient Follow-up:  PCP in 1 week, please check BMP at follow-up Nephrology Dr. Hollie Salk in 1 month CHF team Dr. Aundra Dubin in 3 weeks Routine catheter care, needs urology follow-up in 1 to 2 weeks for voiding trial Follow-up with EP to eval for atrial tachycardia/A-fib ablation   Discharge Diagnoses:  Principal Problem:   Acute on chronic combined systolic and diastolic CHF (congestive heart failure) ([place]) Active Problems:   Atrial fibrillation with RVR ([place])   Acute-on-chronic kidney injury ([place])   Nephrotic syndrome   Acute cystitis   Obesity Influenza A Urinary retention   Hyperlipidemia with target LDL less than 70   Mild persistent asthma   Essential hypertension   Cardiomyopathy, ischemic   Aortic stenosis, mild   Acute respiratory failure with hypoxia (HCC)   Type 2 diabetes mellitus with stage 3a chronic kidney disease, with long-term current use of insulin (HCC)   Bilateral pleural effusion   Amidorone associated Pulmonary fibrosis ([place])   Hypoalbuminemia DO NOT RESUSCITATE  Discharge Condition: Improved  Diet recommendation: Low-sodium, heart healthy  Filed Weights   05/03/22 0450 05/04/22 0450 05/05/22 0400  Weight: 112.7 kg 111 kg 111.5 kg    History of present illness:  David Huynh is an 81 y.o. M with sdCHF EF 45-50%, pAF on Eliquis, obesity, DM, CKD IIIb, HTN and hx amio lung fibrosis who presented with progressive SOB and edema.  -Respiratory distress in the ER required BiPAP, urgent thoracentesis -Hospital course complicated by worsening AKI, urinary retention requiring coud catheter placement  Hospital Course:   Acute on chronic combined systolic and diastolic CHF  BL pleural effusions Presented with leg swelling, dyspnea on  exertion, CXR showing bilateral opacities and pleural effusions. -ECHO  w/ EF 35-40% in the past, npw 40-45%, mildly reduced RV -s/p Thora-1.5L taken off left lung on 12/14.  Transudative -CXR 12/16 showed large effusions again, repeat bilateral thoracentesis of 2.7L more fluid. Cx negative, cytology negative for malignancy -diuresed w/ lasix gtt, metolazone, he is 14.5 L negative -Diuretics held briefly, creatinine stabilizing at 3, GDMT limited by CKD -Oral torsemide resumed at 20 mg every other day, continue metoprolol -Discharge to SNF for short-term rehab   Influenza A -Tested positive yesterday (12/21), developed cough URI and sore throat, mild wheezing today -Started Tamiflu, prednisone and nebs  Atrial fibrillation with RVR/Atrial tacahycardia Amiodarone avoided with history of lung toxicity, options limited with CKD - Continue apixaban, metoprolol per Cardiology, dose increased -  per EP, not candidate fot ablation now, FU as outpt   Nephrotic syndrome Acute-on-chronic kidney injury ([place]) Chronic kidney disease stage IIIa baseline 1.6, was 2.5 mg/dL on admission, no improvement with decongestion.   -Cr here up to 3.1 mg/dL in the last two days. -renal foll, serologies for GN sent REnal US negative - Coud catheter placed for urinary retention, continue catheter at discharge, follow-up with urology - po torsemide resumed at 20 mg every other day -Follow-up with nephrology seen by Dr.Upton in the hospital   Acute cystitis Developed urinary retention, urinary urgency 12/18.  Urine culture with E coli. - Continue amoxicillin, day 3/5 - Started on Flomax - plan to continue foley at discharge and recommend close Urology follow up for voiding trial, considering he required Coude 12/19   Hypoalbuminemia - Ensure   Amidorone associated Pulmonary  fibrosis [facility]) Admitted Apr 2022 with bilateral infiltrates and found to have amio lung toxicity, completed prolonged prednisone taper  and was improving, but CT 9/22 still showed amio associated lung fibrosis.  Follow-up with pulmonary   Type 2 diabetes mellitus -CBG stable, on Humalog sliding scale at baseline   Acute respiratory failure with hypoxia (HCC) Presented with tachynea, respiratory distress and hypoxia requiring BiPAP.  Due to fluid overload, CHF, pleural effusions.  -Improved   Mild persistent asthma - PRN levalbuterol   Hyperlipidemia with target LDL less than 70 - Continue Crestor   Obesity BMI >30     Consultants:  CHF team, REnal, palliative care    Discharge Exam: Vitals:   05/05/22 0600 05/05/22 0800  BP: 121/79 137/77  Pulse:  78  Resp:    Temp:    SpO2:  92%   Gen: Awake, Alert, Oriented X 3,  HEENT: no JVD Lungs: Poor air movement, scant expiratory wheezes CVS: S1S2/RRR Abd: soft, Non tender, non distended, BS present Extremities: No edema Skin: no new rashes on exposed skin   Discharge Instructions    Allergies as of 05/05/2022       Reactions   Amiodarone Shortness Of Breath   Pulmonary toxicity        Medication List     STOP taking these medications    carvedilol 25 MG tablet Commonly known as: COREG   Entresto 24-26 MG Generic drug: sacubitril-valsartan   metFORMIN 1000 MG tablet Commonly known as: GLUCOPHAGE   spironolactone 25 MG tablet Commonly known as: ALDACTONE       TAKE these medications    acetaminophen 500 MG tablet Commonly known as: TYLENOL Take 1,000 mg by mouth every 6 (six) hours as needed for moderate pain or headache.   Advair Diskus 100-50 MCG/ACT Aepb Generic drug: fluticasone-salmeterol INHALE 1 PUFF INTO THE LUNGS TWICE DAILY What changed: See the new instructions.   albuterol 108 (90 Base) MCG/ACT inhaler Commonly known as: VENTOLIN HFA Inhale 2 puffs into the lungs every 6 (six) hours as needed for wheezing or shortness of breath.   amoxicillin 500 MG capsule Commonly known as: AMOXIL Take 1 capsule (500 mg  total) by mouth every 12 (twelve) hours for 2 days.   apixaban 2.5 MG Tabs tablet Commonly known as: Eliquis Take 1 tablet (2.5 mg total) by mouth 2 (two) times daily. TAKE 1 TABLET(5 MG) BY MOUTH TWICE DAILY What changed:  medication strength how much to take how to take this when to take this   cholecalciferol 1000 units tablet Commonly known as: VITAMIN D Take 1 tablet (1,000 Units total) by mouth daily.   ezetimibe 10 MG tablet Commonly known as: ZETIA TAKE 1 TABLET(10 MG) BY MOUTH DAILY What changed: See the new instructions.   glucose blood test strip Use as instructed   HumaLOG KwikPen 100 UNIT/ML KwikPen Generic drug: insulin lispro USE AS DIRECTED. MAX DOSE OF 15 UNITS   icosapent Ethyl 1 g capsule Commonly known as: VASCEPA TAKE 2 CAPSULES(2 GRAMS) BY MOUTH TWICE DAILY What changed: See the new instructions.   Lantus SoloStar 100 UNIT/ML Solostar Pen Generic drug: insulin glargine Inject 6 Units into the skin at bedtime. What changed: how much to take   loratadine 10 MG tablet Commonly known as: CLARITIN Take 1 tablet (10 mg total) by mouth daily. Start taking on: May 06, 2022   metoprolol succinate 25 MG 24 hr tablet Commonly known as: TOPROL-XL Take 3 tablets (75 mg total) by  mouth 2 (two) times daily.   nitroGLYCERIN 0.4 MG SL tablet Commonly known as: Nitrostat Place 1 tablet (0.4 mg total) under the tongue every 5 (five) minutes as needed for chest pain.   oseltamivir 30 MG capsule Commonly known as: TAMIFLU Take 1 capsule (30 mg total) by mouth daily for 3 days. Start taking on: May 06, 2022   Pen Needles 32G X 4 MM Misc Inject 1 Device into the skin in the morning, at noon, in the evening, and at bedtime. Use 1 at bedtime with Lantus.  Dx code: E11.9   potassium chloride SA 20 MEQ tablet Commonly known as: KLOR-CON M Take 1 tablet (20 mEq total) by mouth every other day. Start taking on: May 06, 2022   predniSONE 20 MG  tablet Commonly known as: DELTASONE Take 1-2 tablets (20-40 mg total) by mouth daily with breakfast. Take 40 Mg for 2 days followed by 20 Mg for 2 days and then stop Start taking on: May 06, 2022   rosuvastatin 40 MG tablet Commonly known as: CRESTOR TAKE 1 TABLET(40 MG) BY MOUTH DAILY. Need lab work for future refills. What changed:  how much to take how to take this when to take this additional instructions   tamsulosin 0.4 MG Caps capsule Commonly known as: FLOMAX Take 1 capsule (0.4 mg total) by mouth daily after breakfast. Start taking on: May 06, 2022   torsemide 20 MG tablet Commonly known as: DEMADEX Take 1 tablet (20 mg total) by mouth every other day. Start taking on: May 06, 2022 What changed:  medication strength See the new instructions.               Durable Medical Equipment  (From admission, onward)           Start     Ordered   05/01/22 1214  For home use only DME 4 wheeled rolling walker with seat  Once       Question Answer Comment  Patient needs a walker to treat with the following condition Heart failure [facility])   Patient needs a walker to treat with the following condition Limited mobility      05/01/22 1215           Allergies  Allergen Reactions   Amiodarone Shortness Of Breath    Pulmonary toxicity    Follow-up Information     [name], MD Follow up.   Specialty: Family Medicine Contact information: [address] [place] [address] [phone]         [place] Follow up on 05/11/2022.   Specialty: Cardiology Why: [facility] 2:30 pm Entrance C, Free Valet Parking Contact information: [address] [id number] mc [place] [phone]                 The results of significant diagnostics from this hospitalization (including imaging, microbiology, ancillary and  laboratory) are listed below for reference.    Significant Diagnostic Studies: DG CHEST PORT 1 VIEW  Result Date: 05/05/2022 CLINICAL DATA:  Dyspnea. EXAM: PORTABLE CHEST 1 VIEW COMPARISON:  April 29, 2022. FINDINGS: Stable cardiomegaly. Right-sided PICC line is unchanged in position. Increased right basilar opacity is noted concerning for pneumonia or edema and associated pleural effusion. Left lung is unremarkable. Bony thorax is unremarkable. IMPRESSION: Increased right basilar opacity as described above. Electronically Signed   By: Marijo Conception M.D.   On:  05/05/2022 09:43   DG Chest Port 1 View  Result Date: 04/29/2022 CLINICAL DATA:  Status post thoracentesis EXAM: PORTABLE CHEST 1 VIEW COMPARISON:  Same day chest radiograph FINDINGS: Stable cardiomegaly. Right PICC line remains in place. Significant improved aeration of the lung bases with decreased pleural effusions status post thoracentesis. No pneumothorax. IMPRESSION: Significant improved aeration of the lung bases with decreased pleural effusions status post thoracentesis. No pneumothorax. Electronically Signed   By: Davina Poke D.O.   On: 04/29/2022 16:29   DG CHEST PORT 1 VIEW  Result Date: 04/29/2022 CLINICAL DATA:  Dyspnea. Right pleural effusion. Chronic kidney disease and coronary artery disease. EXAM: PORTABLE CHEST 1 VIEW COMPARISON:  04/27/2022 FINDINGS: Mild cardiomegaly remains stable. Right arm PICC line remains in appropriate position. Increased airspace opacity is seen in the mid and lower lungs bilaterally which is symmetric, and highly suspicious for worsening pulmonary edema. Probable small bilateral pleural effusions also noted. IMPRESSION: Increased airspace opacity in mid and lower lungs bilaterally, highly suspicious for worsening pulmonary edema. Probable small bilateral pleural effusions. Mild cardiomegaly. Electronically Signed   By: Marlaine Hind M.D.   On: 04/29/2022 08:43   DG CHEST PORT 1  VIEW  Result Date: 04/27/2022 CLINICAL DATA:  Post right thoracentesis EXAM: PORTABLE CHEST 1 VIEW COMPARISON:  04/26/2022 FINDINGS: Stable cardiomediastinal contours. Pulmonary vascular congestion with interstitial and alveolar opacities bilaterally, worsened within the left lung. Decreased right-sided pleural effusion status post thoracentesis with improved aeration in the right lung base. No pneumothorax. Stable right-sided PICC line. IMPRESSION: 1. Decreased right-sided pleural effusion status post thoracentesis. No pneumothorax. 2. Pulmonary vascular congestion with interstitial and alveolar opacities bilaterally, worsened within the left lung. Electronically Signed   By: Davina Poke D.O.   On: 04/27/2022 14:52   DG CHEST PORT 1 VIEW  Result Date: 04/26/2022 CLINICAL DATA:  Status post PICC line EXAM: PORTABLE CHEST 1 VIEW COMPARISON:  Chest x-ray earlier same day FINDINGS: Right upper extremity PICC terminates over the distal SVC. There is no pneumothorax. The cardiomediastinal silhouette is within normal limits. There is central pulmonary vascular congestion and patchy bibasilar infiltrates with small pleural effusions similar to the prior study. The osseous structures are stable. IMPRESSION: 1. Right upper extremity PICC terminates over the distal SVC. 2. Central pulmonary vascular congestion and patchy bibasilar infiltrates with small pleural effusions similar to the prior study. Electronically Signed   By: Ronney Asters M.D.   On: 04/26/2022 19:38   ECHOCARDIOGRAM COMPLETE  Result Date: 04/26/2022    ECHOCARDIOGRAM REPORT   Patient Name:   David Huynh Date of Exam: 04/26/2022 Medical Rec #:  [id number]         Height:       72.0 in Accession #:    [id number]        Weight:       274.0 lb Date of Birth:  [birth year]         BSA:          2.436 m Patient Age:    48 years          BP:           137/99 mmHg Patient Gender: M                 HR:           127 bpm. Exam Location:  Inpatient  Procedure: 2D Echo, Cardiac Doppler, Color Doppler and Intracardiac  Opacification Agent Indications:    I50.40* Unspecified combined systolic (congestive) and diastolic                 (congestive) heart failure  History:        Patient has prior history of Echocardiogram examinations. CHF,                 CAD, Aortic Valve Disease, Signs/Symptoms:Dyspnea and Shortness                 of Breath; Risk Factors:Hypertension, Diabetes and Dyslipidemia.  Sonographer:    Roseanna Rainbow RDCS Referring Phys: [phone] JARED M GARDNER  Sonographer Comments: Technically difficult study due to poor echo windows, suboptimal parasternal window, suboptimal subcostal window and suboptimal apical window. Image acquisition challenging due to patient body habitus. Patient supine on CPAP, could not turn. Suboptimal study IMPRESSIONS  1. Vmax 2.5 m/s, MG 17 mmHG, AVA 1.50 cm2, DI 0.34. Suspect gradients lower than expected due to reduced EF. Based on AVA and DI, AS is moderate. The aortic valve is tricuspid. There is severe calcifcation of the aortic valve. There is severe thickening  of the aortic valve. Aortic valve regurgitation is not visualized. Moderate aortic valve stenosis. Aortic valve area, by VTI measures 1.50 cm. Aortic valve mean gradient measures 17.0 mmHg. Aortic valve Vmax measures 2.45 m/s.  2. Left ventricular ejection fraction, by estimation, is 40 to 45%. The left ventricle has mildly decreased function. The left ventricle demonstrates global hypokinesis. Left ventricular diastolic function could not be evaluated.  3. Right ventricular systolic function is mildly reduced. The right ventricular size is mildly enlarged. Tricuspid regurgitation signal is inadequate for assessing PA pressure.  4. Left atrial size was mildly dilated.  5. Moderate pleural effusion in the left lateral region.  6. The mitral valve is grossly normal. Mild mitral valve regurgitation. No evidence of mitral stenosis.  7. The inferior vena  cava is dilated in size with <50% respiratory variability, suggesting right atrial pressure of 15 mmHg. Comparison(s): Changes from prior study are noted. EF 40-45% on this study. In Afib with RVR. Suspect moderate AS. FINDINGS  Left Ventricle: Left ventricular ejection fraction, by estimation, is 40 to 45%. The left ventricle has mildly decreased function. The left ventricle demonstrates global hypokinesis. The left ventricular internal cavity size was normal in size. There is  no left ventricular hypertrophy. Left ventricular diastolic function could not be evaluated due to atrial fibrillation. Left ventricular diastolic function could not be evaluated. Right Ventricle: The right ventricular size is mildly enlarged. No increase in right ventricular wall thickness. Right ventricular systolic function is mildly reduced. Tricuspid regurgitation signal is inadequate for assessing PA pressure. Left Atrium: Left atrial size was mildly dilated. Right Atrium: Right atrial size was normal in size. Pericardium: There is no evidence of pericardial effusion. Mitral Valve: The mitral valve is grossly normal. Mild mitral valve regurgitation. No evidence of mitral valve stenosis. Tricuspid Valve: The tricuspid valve is grossly normal. Tricuspid valve regurgitation is trivial. No evidence of tricuspid stenosis. Aortic Valve: Vmax 2.5 m/s, MG 17 mmHG, AVA 1.50 cm2, DI 0.34. Suspect gradients lower than expected due to reduced EF. Based on AVA and DI, AS is moderate. The aortic valve is tricuspid. There is severe calcifcation of the aortic valve. There is severe thickening of the aortic valve. Aortic valve regurgitation is not visualized. Moderate aortic stenosis is present. Aortic valve mean gradient measures 17.0 mmHg. Aortic valve peak gradient measures 24.1 mmHg. Aortic valve  area, by VTI measures 1.50 cm. Pulmonic Valve: The pulmonic valve was grossly normal. Pulmonic valve regurgitation is not visualized. No evidence of  pulmonic stenosis. Aorta: The aortic root and ascending aorta are structurally normal, with no evidence of dilitation. Venous: The inferior vena cava is dilated in size with less than 50% respiratory variability, suggesting right atrial pressure of 15 mmHg. IAS/Shunts: The interatrial septum was not well visualized. Additional Comments: There is a moderate pleural effusion in the left lateral region.  LEFT VENTRICLE PLAX 2D LVIDd:         5.20 cm      Diastology LVIDs:         4.60 cm      LV e' medial:    5.66 cm/s LV PW:         1.10 cm      LV E/e' medial:  17.1 LV IVS:        1.10 cm      LV e' lateral:   11.70 cm/s LVOT diam:     2.40 cm      LV E/e' lateral: 8.3 LV SV:         62 LV SV Index:   26 LVOT Area:     4.52 cm  LV Volumes (MOD) LV vol d, MOD A2C: 170.0 ml LV vol d, MOD A4C: 174.0 ml LV vol s, MOD A2C: 104.0 ml LV vol s, MOD A4C: 122.0 ml LV SV MOD A2C:     66.0 ml LV SV MOD A4C:     174.0 ml LV SV MOD BP:      55.7 ml RIGHT VENTRICLE            IVC RV S prime:     9.79 cm/s  IVC diam: 2.50 cm TAPSE (M-mode): 1.7 cm LEFT ATRIUM           Index        RIGHT ATRIUM           Index LA diam:      4.10 cm 1.68 cm/m   RA Area:     17.00 cm LA Vol (A2C): 19.4 ml 7.96 ml/m   RA Volume:   52.60 ml  21.60 ml/m LA Vol (A4C): 76.4 ml 31.37 ml/m  AORTIC VALVE AV Area (Vmax):    1.42 cm AV Area (Vmean):   1.39 cm AV Area (VTI):     1.50 cm AV Vmax:           245.33 cm/s AV Vmean:          173.333 cm/s AV VTI:            0.417 m AV Peak Grad:      24.1 mmHg AV Mean Grad:      17.0 mmHg LVOT Vmax:         77.10 cm/s LVOT Vmean:        53.300 cm/s LVOT VTI:          0.138 m LVOT/AV VTI ratio: 0.33  AORTA Ao Root diam: 3.70 cm Ao Asc diam:  3.75 cm MITRAL VALVE MV Area (PHT): 8.25 cm    SHUNTS MV Decel Time: 92 msec     Systemic VTI:  0.14 m MV E velocity: 96.90 cm/s  Systemic Diam: 2.40 cm Eleonore Chiquito MD Electronically signed by Eleonore Chiquito MD Signature Date/Time: 04/26/2022/11:29:11 AM    Final    [place]  EKG SITE RITE  Result Date: 04/26/2022 If Site Rite  image not attached, placement could not be confirmed due to current cardiac rhythm.  US RENAL  Result Date: 04/26/2022 CLINICAL DATA:  [id number] with acute kidney injury. EXAM: RENAL / URINARY TRACT ULTRASOUND COMPLETE COMPARISON:  Renal ultrasound 12/02/2020 FINDINGS: Right Kidney: Renal measurements: 11.4 x 5.2 x 5.5 cm = volume: 169.6 mL, previously 172 mL. Echogenicity within normal limits. No mass or hydronephrosis visualized. There is a 1.8 cm anechoic simple cyst in the superior pole of this kidney, previously 1.6 cm. In the inferior pole, a second simple cyst is noted measuring 2.1 cm, and could have developed in the interval or could have been obscured by bowel gas on the prior study, but was definitely not present on CT in 2016. Left Kidney: Renal measurements: 10.5 x 5.2 x 4.3 cm = volume: 124.0 mL, previously 140 mL. Echogenicity within normal limits. No mass or hydronephrosis visualized. There previously was a nearly 6 cm simple cyst in the superior pole of this kidney, which is not seen today. No focal cortical abnormality is evident. Bladder: Appears normal for degree of bladder distention. Other: No free fluid is seen. IMPRESSION: 1. No hydronephrosis or visible stones. 2. Simple cysts in the right kidney. 3. Previously seen nearly 6 cm simple cyst in the superior pole of the left kidney is not seen today. 4. Bilaterally, no increased cortical echogenicity or significant renal thinning. Electronically Signed   By: Telford Nab M.D.   On: 04/26/2022 04:48   DG Chest Portable 1 View  Result Date: 04/26/2022 CLINICAL DATA:  Shortness of breath. EXAM: PORTABLE CHEST 1 VIEW COMPARISON:  Radiograph 12/28/2020. CT 01/24/2021 FINDINGS: Right pleural effusion appears increased from prior imaging. There is also increasing patchy opacity in the right mid lower lung zone. Possible small left pleural effusion which has also increased. Background  chronic lung disease with parenchymal coarsening. Heart is upper normal in size, not well assessed on this portable exam. No pneumothorax. IMPRESSION: 1. Increasing right pleural effusion and patchy right mid and lower lung zone opacity. This may represent infection or progression of chronic lung disease. 2. Possible small left pleural effusion, also increased. Electronically Signed   By: Keith Rake M.D.   On: 04/26/2022 01:45    Microbiology: Recent Results (from the past 240 hour(s))  Resp panel by RT-PCR (RSV, Flu A&B, Covid) Anterior Nasal Swab     Status: None   Collection Time: 04/26/22 12:57 AM   Specimen: Anterior Nasal Swab  Result Value Ref Range Status   SARS Coronavirus 2 by RT PCR NEGATIVE NEGATIVE Final    Comment: (NOTE) SARS-CoV-2 target nucleic acids are NOT DETECTED.  The SARS-CoV-2 RNA is generally detectable in upper respiratory specimens during the acute phase of infection. The lowest concentration of SARS-CoV-2 viral copies this assay can detect is 138 copies/mL. A negative result does not preclude SARS-Cov-2 infection and should not be used as the sole basis for treatment or other patient management decisions. A negative result may occur with  improper specimen collection/handling, submission of specimen other than nasopharyngeal swab, presence of viral mutation(s) within the areas targeted by this assay, and inadequate number of viral copies(<138 copies/mL). A negative result must be combined with clinical observations, patient history, and epidemiological information. The expected result is Negative.  Fact Sheet for Patients:  EntrepreneurPulse.com.au  Fact Sheet for Healthcare Providers:  IncredibleEmployment.be  This test is no t yet approved or cleared by the [place] FDA and  has been authorized for detection and/or diagnosis of  SARS-CoV-2 by FDA under an Emergency Use Authorization (EUA). This EUA will remain   in effect (meaning this test can be used) for the duration of the COVID-19 declaration under Section 564(b)(1) of the Act, 21 U.S.C.section 360bbb-3(b)(1), unless the authorization is terminated  or revoked sooner.       Influenza A by PCR NEGATIVE NEGATIVE Final   Influenza B by PCR NEGATIVE NEGATIVE Final    Comment: (NOTE) The Xpert Xpress SARS-CoV-2/FLU/RSV plus assay is intended as an aid in the diagnosis of influenza from Nasopharyngeal swab specimens and should not be used as a sole basis for treatment. Nasal washings and aspirates are unacceptable for Xpert Xpress SARS-CoV-2/FLU/RSV testing.  Fact Sheet for Patients: EntrepreneurPulse.com.au  Fact Sheet for Healthcare Providers: IncredibleEmployment.be  This test is not yet approved or cleared by the [place] FDA and has been authorized for detection and/or diagnosis of SARS-CoV-2 by FDA under an Emergency Use Authorization (EUA). This EUA will remain in effect (meaning this test can be used) for the duration of the COVID-19 declaration under Section 564(b)(1) of the Act, 21 U.S.C. section 360bbb-3(b)(1), unless the authorization is terminated or revoked.     Resp Syncytial Virus by PCR NEGATIVE NEGATIVE Final    Comment: (NOTE) Fact Sheet for Patients: EntrepreneurPulse.com.au  Fact Sheet for Healthcare Providers: IncredibleEmployment.be  This test is not yet approved or cleared by the [place] FDA and has been authorized for detection and/or diagnosis of SARS-CoV-2 by FDA under an Emergency Use Authorization (EUA). This EUA will remain in effect (meaning this test can be used) for the duration of the COVID-19 declaration under Section 564(b)(1) of the Act, 21 U.S.C. section 360bbb-3(b)(1), unless the authorization is terminated or revoked.  Performed at [facility], [place] [address], [place], [place] [address]    Respiratory (~20 pathogens) panel by PCR     Status: None   Collection Time: 04/27/22 11:09 AM   Specimen: Nasopharyngeal Swab; Respiratory  Result Value Ref Range Status   Adenovirus NOT DETECTED NOT DETECTED Final   Coronavirus 229E NOT DETECTED NOT DETECTED Final    Comment: (NOTE) The Coronavirus on the Respiratory Panel, DOES NOT test for the novel  Coronavirus (2019 nCoV)    Coronavirus HKU1 NOT DETECTED NOT DETECTED Final   Coronavirus NL63 NOT DETECTED NOT DETECTED Final   Coronavirus OC43 NOT DETECTED NOT DETECTED Final   Metapneumovirus NOT DETECTED NOT DETECTED Final   Rhinovirus / Enterovirus NOT DETECTED NOT DETECTED Final   Influenza A NOT DETECTED NOT DETECTED Final   Influenza B NOT DETECTED NOT DETECTED Final   Parainfluenza Virus 1 NOT DETECTED NOT DETECTED Final   Parainfluenza Virus 2 NOT DETECTED NOT DETECTED Final   Parainfluenza Virus 3 NOT DETECTED NOT DETECTED Final   Parainfluenza Virus 4 NOT DETECTED NOT DETECTED Final   Respiratory Syncytial Virus NOT DETECTED NOT DETECTED Final   Bordetella pertussis NOT DETECTED NOT DETECTED Final   Bordetella Parapertussis NOT DETECTED NOT DETECTED Final   Chlamydophila pneumoniae NOT DETECTED NOT DETECTED Final   Mycoplasma pneumoniae NOT DETECTED NOT DETECTED Final    Comment: Performed at [facility], [place]. [address] [place], [place] [address]  Body fluid culture w Gram Stain     Status: None   Collection Time: 04/27/22  1:31 PM   Specimen: Pleural Fluid  Result Value Ref Range Status   Specimen Description FLUID PLEURAL RIGHT  Final   Special Requests NONE  Final   Gram Stain  Final    WBC PRESENT, PREDOMINANTLY MONONUCLEAR NO ORGANISMS SEEN CYTOSPIN SMEAR    Culture   Final    NO GROWTH 3 DAYS Performed at [facility], [place] [address], [place], [place] [address]    Report Status 04/30/2022 FINAL  Final  Urine Culture     Status: Abnormal   Collection Time: 04/30/22  8:08 PM   Specimen:  Urine, Clean Catch  Result Value Ref Range Status   Specimen Description URINE, CLEAN CATCH  Final   Special Requests   Final    NONE Performed at [facility], [place] [address] [place], [place] [address]    Culture >=100,000 COLONIES/mL ESCHERICHIA COLI (A)  Final   Report Status 05/02/2022 FINAL  Final   Organism ID, Bacteria ESCHERICHIA COLI (A)  Final      Susceptibility   Escherichia coli - MIC*    AMPICILLIN 8 SENSITIVE Sensitive     CEFAZOLIN 8 SENSITIVE Sensitive     CEFEPIME <=0.12 SENSITIVE Sensitive     CEFTRIAXONE 1 SENSITIVE Sensitive     CIPROFLOXACIN <=0.25 SENSITIVE Sensitive     GENTAMICIN <=1 SENSITIVE Sensitive     IMIPENEM <=0.25 SENSITIVE Sensitive     NITROFURANTOIN <=16 SENSITIVE Sensitive     TRIMETH/SULFA >=320 RESISTANT Resistant     AMPICILLIN/SULBACTAM <=2 SENSITIVE Sensitive     PIP/TAZO <=4 SENSITIVE Sensitive     * >=100,000 COLONIES/mL ESCHERICHIA COLI  Resp panel by RT-PCR (RSV, Flu A&B, Covid) Anterior Nasal Swab     Status: Abnormal   Collection Time: 05/04/22  9:40 AM   Specimen: Anterior Nasal Swab  Result Value Ref Range Status   SARS Coronavirus 2 by RT PCR NEGATIVE NEGATIVE Final    Comment: (NOTE) SARS-CoV-2 target nucleic acids are NOT DETECTED.  The SARS-CoV-2 RNA is generally detectable in upper respiratory specimens during the acute phase of infection. The lowest concentration of SARS-CoV-2 viral copies this assay can detect is 138 copies/mL. A negative result does not preclude SARS-Cov-2 infection and should not be used as the sole basis for treatment or other patient management decisions. A negative result may occur with  improper specimen collection/handling, submission of specimen other than nasopharyngeal swab, presence of viral mutation(s) within the areas targeted by this assay, and inadequate number of viral copies(<138 copies/mL). A negative result must be combined with clinical observations, patient history, and  epidemiological information. The expected result is Negative.  Fact Sheet for Patients:  EntrepreneurPulse.com.au  Fact Sheet for Healthcare Providers:  IncredibleEmployment.be  This test is no t yet approved or cleared by the [place] FDA and  has been authorized for detection and/or diagnosis of SARS-CoV-2 by FDA under an Emergency Use Authorization (EUA). This EUA will remain  in effect (meaning this test can be used) for the duration of the COVID-19 declaration under Section 564(b)(1) of the Act, 21 U.S.C.section 360bbb-3(b)(1), unless the authorization is terminated  or revoked sooner.       Influenza A by PCR POSITIVE (A) NEGATIVE Final   Influenza B by PCR NEGATIVE NEGATIVE Final    Comment: (NOTE) The Xpert Xpress SARS-CoV-2/FLU/RSV plus assay is intended as an aid in the diagnosis of influenza from Nasopharyngeal swab specimens and should not be used as a sole basis for treatment. Nasal washings and aspirates are unacceptable for Xpert Xpress SARS-CoV-2/FLU/RSV testing.  Fact Sheet for Patients: EntrepreneurPulse.com.au  Fact Sheet for Healthcare Providers: IncredibleEmployment.be  This test is not yet approved or cleared by  the [employer] and has been authorized for detection and/or diagnosis of SARS-CoV-2 by FDA under an Emergency Use Authorization (EUA). This EUA will remain in effect (meaning this test can be used) for the duration of the COVID-19 declaration under Section 564(b)(1) of the Act, 21 U.S.C. section 360bbb-3(b)(1), unless the authorization is terminated or revoked.     Resp Syncytial Virus by PCR NEGATIVE NEGATIVE Final    Comment: (NOTE) Fact Sheet for Patients: BloggerCourse.com  Fact Sheet for Healthcare Providers: SeriousBroker.it  This test is not yet approved or cleared by the [place] FDA and has  been authorized for detection and/or diagnosis of SARS-CoV-2 by FDA under an Emergency Use Authorization (EUA). This EUA will remain in effect (meaning this test can be used) for the duration of the COVID-19 declaration under Section 564(b)(1) of the Act, 21 U.S.C. section 360bbb-3(b)(1), unless the authorization is terminated or revoked.  Performed at [facility], [address], [place], [place] [address]      Labs: Basic Metabolic Panel: Recent Labs  Lab 04/29/22 0505 04/30/22 0445 05/01/22 0224 05/02/22 1093 05/03/22 0647 05/04/22 0552 05/05/22 0008 05/05/22 0500  NA 139   < > 135 135 136 134*  --  137  K 3.9   < > 3.7 4.2 4.0 3.3* 3.9 3.8  CL 100   < > 95* 97* 98 98  --  101  CO2 29   < > 30 30 30 28   --  28  GLUCOSE 115*   < > 120* 111* 97 97  --  117*  BUN 43*   < > 45* 47* 53* 50*  --  51*  CREATININE 2.52*   < > 2.46* 2.86* 3.09* 3.03*  --  3.14*  CALCIUM 8.1*   < > 8.2* 7.8* 7.5* 7.2*  --  7.8*  MG 2.0  --   --   --   --   --  2.4  --    < > = values in this interval not displayed.   Liver Function Tests: No results for input(s): "AST", "ALT", "ALKPHOS", "BILITOT", "PROT", "ALBUMIN" in the last 168 hours. No results for input(s): "LIPASE", "AMYLASE" in the last 168 hours. No results for input(s): "AMMONIA" in the last 168 hours. CBC: Recent Labs  Lab 05/01/22 0224 05/02/22 0624 05/03/22 0647 05/04/22 0552 05/05/22 0500  WBC 6.4 7.6 10.4 8.6 8.8  HGB 12.7* 12.3* 11.0* 11.0* 12.0*  HCT 40.0 38.1* 34.2* 35.1* 36.8*  MCV 92.8 92.5 93.2 94.4 93.2  PLT 317 332 328 322 349   Cardiac Enzymes: No results for input(s): "CKTOTAL", "CKMB", "CKMBINDEX", "TROPONINI" in the last 168 hours. BNP: BNP (last 3 results) Recent Labs    04/26/22 0100  BNP 1,368.4*    ProBNP (last 3 results) No results for input(s): "PROBNP" in the last 8760 hours.  CBG: Recent Labs  Lab 05/04/22 1135 05/04/22 1718 05/04/22 2151 05/05/22 0626 05/05/22 1133  GLUCAP  111* 118* 146* 109* 118*       Signed:  05/07/22 MD.  [facility] 05/05/2022, 1:27 PM

## 2022-05-05 NOTE — Progress Notes (Signed)
Patient ID: David Huynh, male   DOB: 1940/07/05, 81 y.o.   MRN: [id number]   Advanced Heart Failure Rounding Note  PCP-Cardiologist: [name], MD   Subjective:    Coude cath remains in place.  BUN/creatinine have stabilized, slight increase creatinine to 3.1 this morning.  CVP 7 this morning with co-ox 70%.   E coli UTI, on amoxicillin.   Influenza A, on oseltamivir. Cough improved.   Telemetry shows short runs of AT, mostly NSR.   No dyspnea at rest.  Objective:   Weight Range: 111.5 kg Body mass index is 33.34 kg/m.   Vital Signs:   Temp:  [97.4 F (36.3 C)-98.7 F (37.1 C)] 97.4 F (36.3 C) (12/22 0400) Pulse Rate:  [73-108] 73 (12/22 0400) Resp:  [17-18] 17 (12/22 0400) BP: (110-121)/(62-79) 121/79 (12/22 0600) SpO2:  [96 %-100 %] 100 % (12/22 0400) Weight:  [111.5 kg] 111.5 kg (12/22 0400) Last BM Date : 05/02/22  Weight change: Filed Weights   05/03/22 0450 05/04/22 0450 05/05/22 0400  Weight: 112.7 kg 111 kg 111.5 kg    Intake/Output:   Intake/Output Summary (Last 24 hours) at 05/05/2022 0747 Last data filed at 05/05/2022 0243 Gross per 24 hour  Intake --  Output 650 ml  Net -650 ml      Physical Exam    General: NAD Neck: JVP 7-8 cm, no thyromegaly or thyroid nodule.  Lungs: Clear to auscultation bilaterally with normal respiratory effort. CV: Nondisplaced PMI.  Heart regular S1/S2, no S3/S4, 2/6 SEM RUSB.  No peripheral edema.   Abdomen: Soft, nontender, no hepatosplenomegaly, no distention.  Skin: Intact without lesions or rashes.  Neurologic: Alert and oriented x 3.  Psych: Normal affect. Extremities: No clubbing or cyanosis.  HEENT: Normal.   Telemetry   NSR 80s with short AT runs (personally reviewed).   EKG    N/A   Labs    CBC Recent Labs    05/04/22 0552 05/05/22 0500  WBC 8.6 8.8  HGB 11.0* 12.0*  HCT 35.1* 36.8*  MCV 94.4 93.2  PLT 322 349   Basic Metabolic Panel Recent Labs    34/74/25 0552  05/05/22 0008 05/05/22 0500  NA 134*  --  137  K 3.3* 3.9 3.8  CL 98  --  101  CO2 28  --  28  GLUCOSE 97  --  117*  BUN 50*  --  51*  CREATININE 3.03*  --  3.14*  CALCIUM 7.2*  --  7.8*  MG  --  2.4  --    Liver Function Tests No results for input(s): "AST", "ALT", "ALKPHOS", "BILITOT", "PROT", "ALBUMIN" in the last 72 hours. No results for input(s): "LIPASE", "AMYLASE" in the last 72 hours. Cardiac Enzymes No results for input(s): "CKTOTAL", "CKMB", "CKMBINDEX", "TROPONINI" in the last 72 hours.  BNP: BNP (last 3 results) Recent Labs    04/26/22 0100  BNP 1,368.4*    ProBNP (last 3 results) No results for input(s): "PROBNP" in the last 8760 hours.   D-Dimer No results for input(s): "DDIMER" in the last 72 hours. Hemoglobin A1C No results for input(s): "HGBA1C" in the last 72 hours.  Fasting Lipid Panel No results for input(s): "CHOL", "HDL", "LDLCALC", "TRIG", "CHOLHDL", "LDLDIRECT" in the last 72 hours. Thyroid Function Tests No results for input(s): "TSH", "T4TOTAL", "T3FREE", "THYROIDAB" in the last 72 hours.  Invalid input(s): "FREET3"  Other results:   Imaging    No results found.   Medications:  Scheduled Medications:  amoxicillin  500 mg Oral Q12H   apixaban  2.5 mg Oral BID   Chlorhexidine Gluconate Cloth  6 each Topical Daily   feeding supplement  237 mL Oral BID BM   insulin aspart  0-15 Units Subcutaneous TID WC   insulin aspart  0-5 Units Subcutaneous QHS   metoprolol succinate  75 mg Oral BID   multivitamin with minerals  1 tablet Oral Daily   oseltamivir  30 mg Oral Daily   potassium chloride  20 mEq Oral Daily   rosuvastatin  10 mg Oral Daily   sodium chloride flush  10-40 mL Intracatheter Q12H   sodium chloride flush  3 mL Intravenous Q12H   tamsulosin  0.4 mg Oral QPC breakfast   [START ON 05/06/2022] torsemide  20 mg Oral QODAY    Infusions:  sodium chloride Stopped (05/01/22 0806)   sodium chloride Stopped (05/02/22  1150)    PRN Medications: sodium chloride, acetaminophen, hydrALAZINE, labetalol, levalbuterol, ondansetron (ZOFRAN) IV, sodium chloride flush, sodium chloride flush    Patient Profile   81 y.o. male with a history of CAD, chronic systolic CHF, HTN, HLD, DM, CKD 3, asthma, and PAF w/ h/o amiodarone lung toxicity, admitted w/ a/c CHF and recurrent AF w/ RVR.   Assessment/Plan   1. Acute on chronic CHF: Prior ischemic cardiomyopathy, improved after PCI in 2017.  Echo 03/2018 with EF back down to 35-40% and moderate-severe RV dysfunction. This was in the setting of atrial fibrillation with RVR. EF 40% on TEE 04/2018. Echo in 3/20 showed EF up to 50%.  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  Echo in 7/21 with EF 25-30%, severe RV dysfunction.  TEE in 10/21, however, showed EF up to 50%.  He had PCI to LAD in 10/21. Echo in 3/22 showed stable EF 50-55% with normal RV.   Echo in 8/22 with EF 45-50%, moderate LVH, normal RV, moderate AS.  He returns with marked volume overload in setting of atrial tachycardia with RVR.  No evidence for ACS, suspect exacerbation may be due to atrial tachy/RVR. He had been lost to follow-up. Echo this admit EF 40-45%, RV mildly reduced, moderate AS. PICC placed. Initially with marked peripheral edema and oxygen requirement. Excellent UOP with lasix gtt at 15/hr and metolazone.  Weight down significantly.  CVP is 7 today with co-ox 70%, creatinine slightly higher at 3.1.  Management is complicated by nephrotic syndrome which will give him a tendency towards significant peripheral edema.  - Hold torsemide today, change to 20 mg every other day.  - Off Hydralazine/Imdur with soft BP  - Would like eventual SGLT2 inhibitor but has UTI and elevated creatinine so not now.  2. 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  Had successful DC-CV on 03/29/18 but then back in atrial fibrillation.  Ranolazine added, but failed DCCV again 04/02/18. DCCV 04/19/18 was successful.  AF with RVR at 3/22 admission, amiodarone restarted and he was cardioverted to NSR, but he developed amiodarone lung toxicity.  Amiodarone was stopped. Now s/p atrial fibrillation ablation in 6/22.  At admission, he was in atrial tachycardia with RVR, not sure how long. Suspect this triggered recurrent CHF.  He does not appear to still be on ranolazine at home. Over the weekend, he alternated between NSR and atrial tachy with rate in 120s.  Now he is mostly in NSR with short AT runs.  - ECG to confirm rhythm, baseline artifact  on telemetry.  - No amiodarone with history of lung toxicity, anti-arrhythmic options limited as he also has AKI on CKD stage 3.    - Not Tikosyn candidate with CKD.  - Continue Eliquis 2.5 mg bid.  - Continue Toprol XL 75 mg bid to try to control AT (on Coreg at home).  - Will followup with EP as outpatient for consideration of ablation.    3. AKI on CKD stage 3: Creatinine baseline in past (1 year ago) was around 1.6.   Patient has nephrotic syndrome with 7.7 g/day proteinuria and low albumen.  Suspect this is from diabetic nephropathy though he also uses NSAIDs frequently. Creatinine 2.4 => 2.86 => 3.09 => 3.03 => 3.1 in setting of urinary obstruction and diuresis.  Renal function appears to have stabilized. - Will change torsemide to 20 mg qod as above.  - Hold Entresto, spironolactone (not totally clear that he was taking at home).  - Nephrology following, will need to see them in followup.  4. CAD: History of DES to RCA in 9/17.  Underwent LHC 04/03/18 with PCI to prox left circ. LHC in 10/21 with PCI to LAD.  No chest pain this admission.  Mild elevation in TnI but no trend, doubt ACS.  - He is on Eliquis, so no ASA.   - Continue statin.  - Defer cath with elevated creatinine and no ACS.  5. Aortic stenosis: Moderate on 8/22 echo. Moderate on echo this admit, AVA 1.50 cm2, DI 0.34, mean  gradient 17 mmHG.  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  He improved with steroids in the past.  7. Pleural effusion: Thoracentesis x 2 on left and x 1 on right this admission.  Transudates, due to CHF/nephrotic syndrome.  8. BPH/urinary obstruction: Coude catheter placed, continue Flomax. He will go to facility with foley in place and should followup with urology.  9. UTI: E coli UTI. - Amoxicillin.  - No SGLT2i for now 10. Influenza A: Oseltamivir.   Disposition: OK for discharge from cardiac standpoint, will need SNF.  Followup CHF clinic.  Cardiac meds for discharge: torsemide 20 mg every other day, KCl 20 mEq every other day, apixaban 2.5 bid, Toprol XL 75 mg bid, Crestor 10 mg daily.   Loralie Champagne  05/05/2022 7:47 AM

## 2022-05-06 DIAGNOSIS — I5043 Acute on chronic combined systolic (congestive) and diastolic (congestive) heart failure: Secondary | ICD-10-CM | POA: Diagnosis not present

## 2022-05-06 LAB — BASIC METABOLIC PANEL
Anion gap: 9 (ref 5–15)
BUN: 55 mg/dL — ABNORMAL HIGH (ref 8–23)
CO2: 26 mmol/L (ref 22–32)
Calcium: 7.4 mg/dL — ABNORMAL LOW (ref 8.9–10.3)
Chloride: 101 mmol/L (ref 98–111)
Creatinine, Ser: 3.37 mg/dL — ABNORMAL HIGH (ref 0.61–1.24)
GFR, Estimated: 18 mL/min — ABNORMAL LOW (ref >60)
Glucose, Bld: 186 mg/dL — ABNORMAL HIGH (ref 70–99)
Potassium: 3.7 mmol/L (ref 3.5–5.1)
Sodium: 136 mmol/L (ref 135–145)

## 2022-05-06 LAB — GLUCOSE, CAPILLARY
Glucose-Capillary: 154 mg/dL — ABNORMAL HIGH (ref 70–99)
Glucose-Capillary: 162 mg/dL — ABNORMAL HIGH (ref 70–99)
Glucose-Capillary: 185 mg/dL — ABNORMAL HIGH (ref 70–99)
Glucose-Capillary: 230 mg/dL — ABNORMAL HIGH (ref 70–99)
Glucose-Capillary: 257 mg/dL — ABNORMAL HIGH (ref 70–99)
Glucose-Capillary: 274 mg/dL — ABNORMAL HIGH (ref 70–99)

## 2022-05-06 LAB — COOXEMETRY PANEL
Carboxyhemoglobin: 1.5 % (ref 0.5–1.5)
Methemoglobin: 0.7 % (ref 0.0–1.5)
O2 Saturation: 58.8 %
Total hemoglobin: 12.1 g/dL (ref 12.0–16.0)

## 2022-05-06 LAB — URINALYSIS, ROUTINE W REFLEX MICROSCOPIC
Bilirubin Urine: NEGATIVE
Glucose, UA: 150 mg/dL — AB
Ketones, ur: NEGATIVE mg/dL
Nitrite: NEGATIVE
Protein, ur: >=300 mg/dL — AB
Specific Gravity, Urine: 1.016 (ref 1.005–1.030)
WBC, UA: >50 WBC/hpf — ABNORMAL HIGH (ref 0–5)
pH: 7 (ref 5.0–8.0)

## 2022-05-06 LAB — NA AND K (SODIUM & POTASSIUM), RAND UR
Potassium Urine: 53 mmol/L
Sodium, Ur: 37 mmol/L

## 2022-05-06 LAB — OSMOLALITY, URINE: Osmolality, Ur: 383 mOsm/kg (ref 300–900)

## 2022-05-06 IMAGING — US US RENAL
1 series · 14 of 25 positions shown · non-contrast
Comparison: Ultrasound 10/17/2010

CLINICAL DATA: Stage 3a chronic kidney disease (HCC).

EXAM:
RENAL / URINARY TRACT ULTRASOUND COMPLETE

[Series 1: us renal · 0.26mm/px · 14 of 39 slices shown]
[im 1/39]
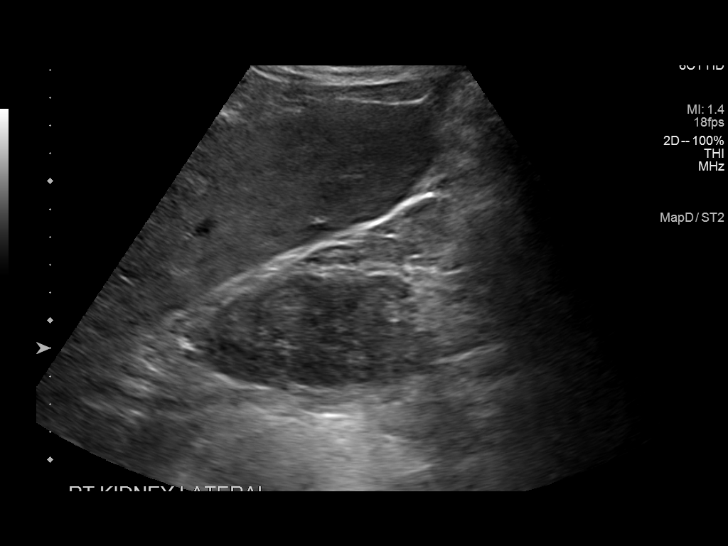
[im 4/39]
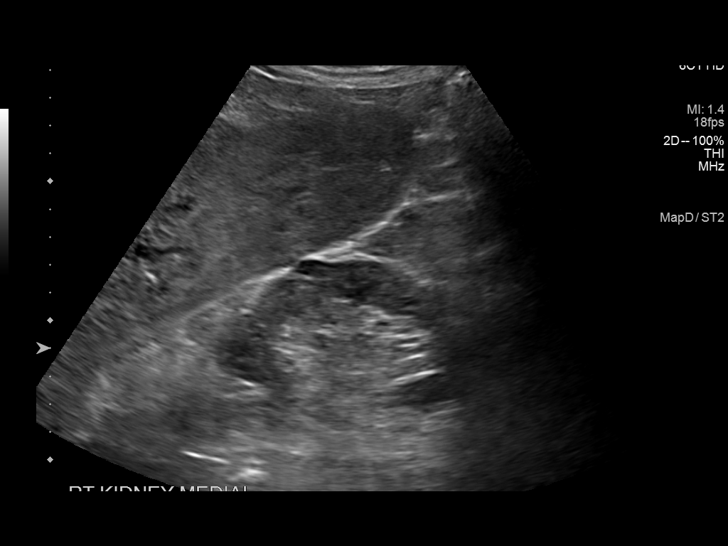
[im 7/39]
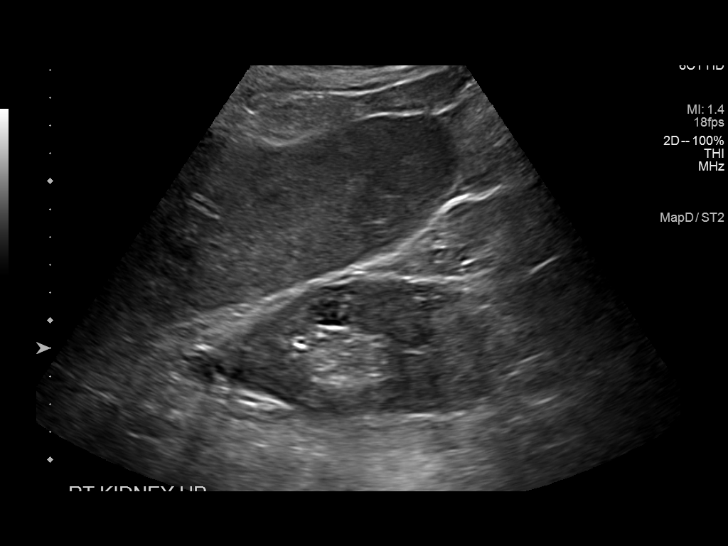
[im 10/39]
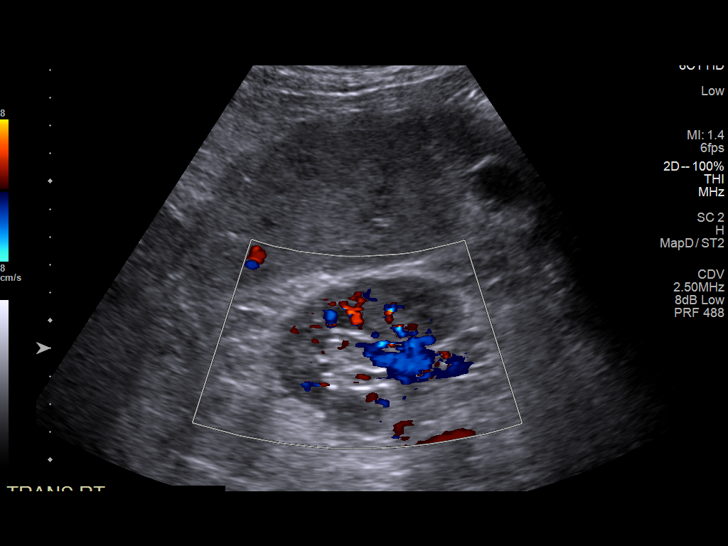
[im 13/39]
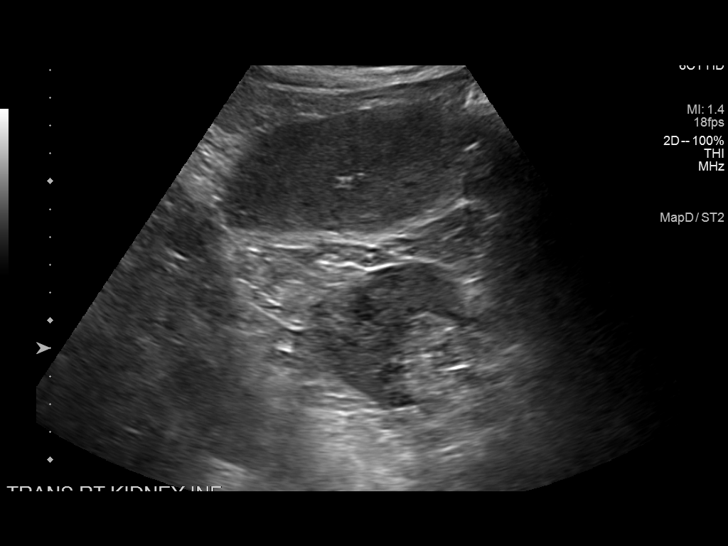
[im 15/39]
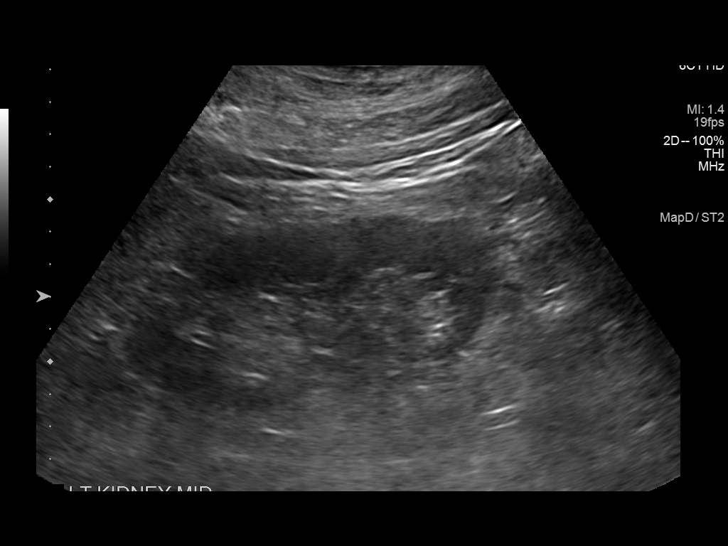
[im 18/39]
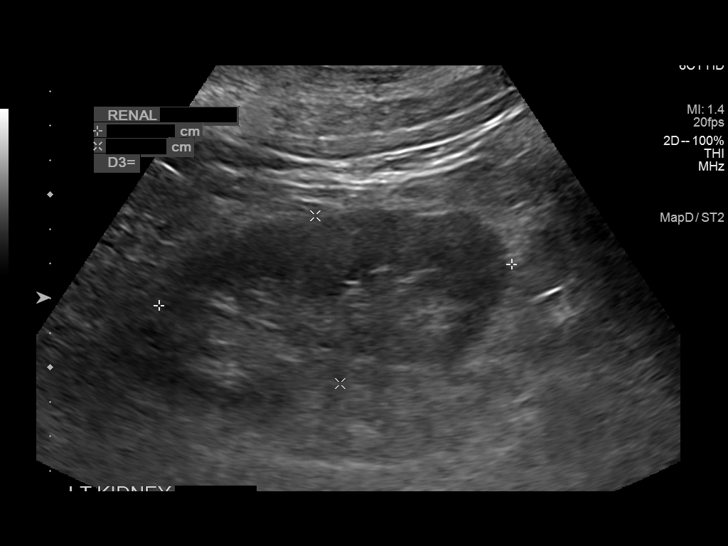
[im 21/39]
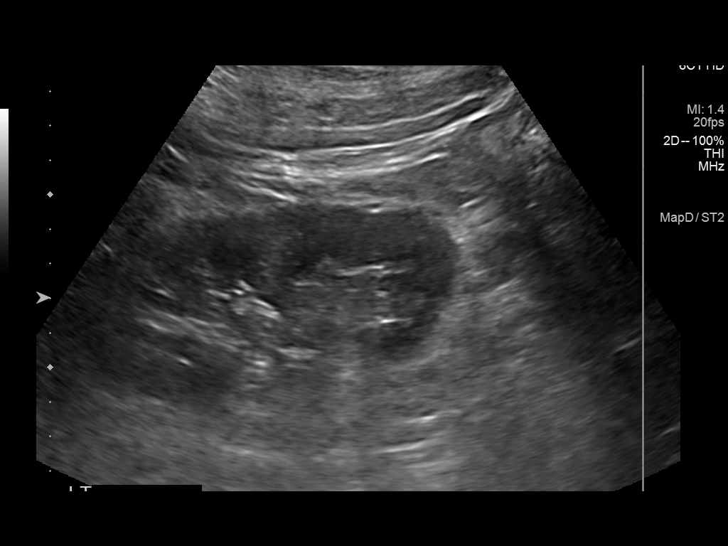
[im 24/39]
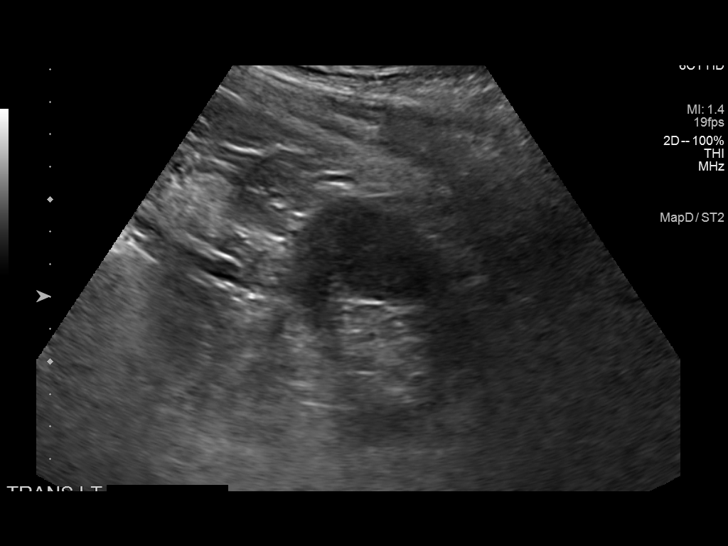
[im 26/39]
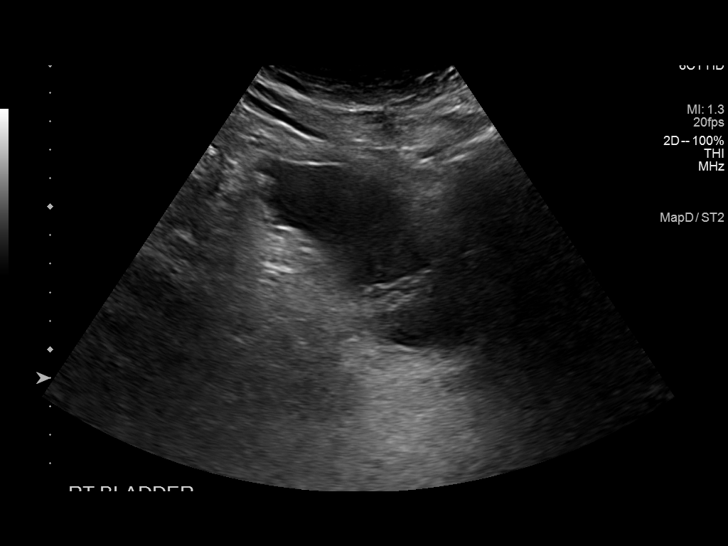
[im 29/39]
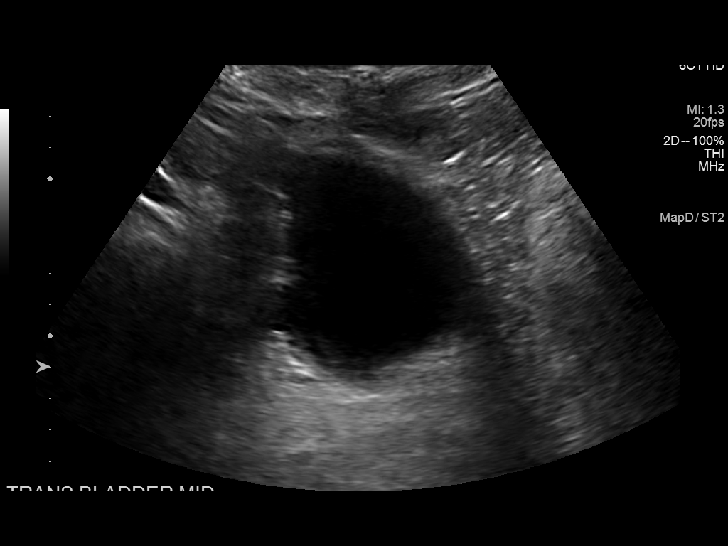
[im 32/39]
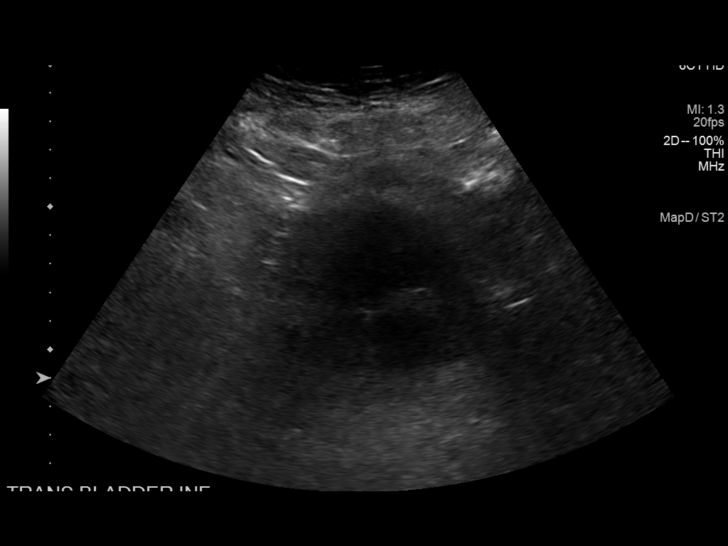
[im 35/39]
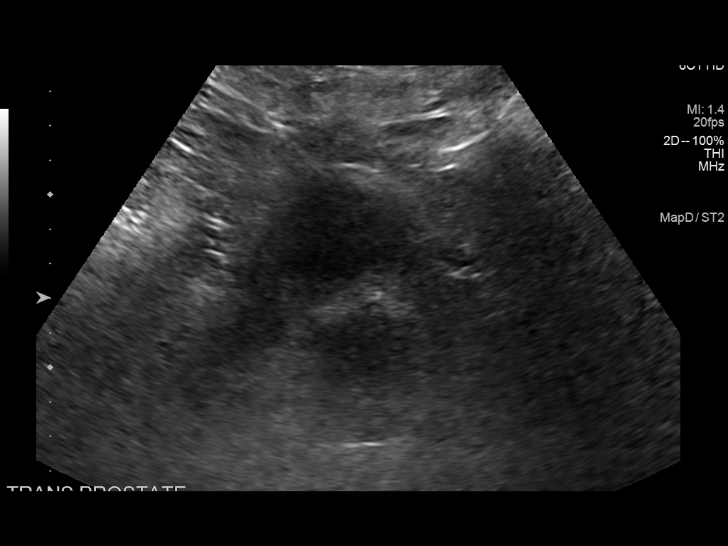
[im 39/39]
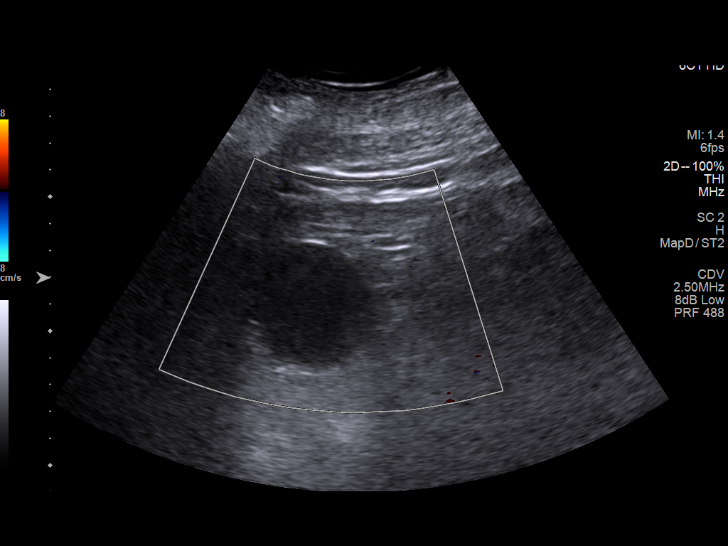

[14 of 25 positions shown; findings below may reference images not displayed]

FINDINGS: Right Kidney:

Renal measurements: 10.3 x 5.7 x 5.6 cm = volume: 172 mL. There is
an anechoic cyst measuring 1.6 x 1.3 x 1.1 cm which is exophytic in
the upper pole. This previously measured up to 1.3 cm on prior CT in
0268.

Left Kidney:

Renal measurements: 10.3 x 4.9 x 5.3 cm = volume: 140 mL. There is a
left renal cyst in the upper pole the left kidney measuring 5.7 x
5.1 x 4.2 cm, this is increased in size since prior CT in 0268 when
it measured up to 3.5 cm.

Bladder:

Appears normal for degree of bladder distention.

Other:

None.
IMPRESSION: No hydronephrosis.

Bilateral simple appearing renal cysts, measuring up to 1.6 cm on
the right and 5.7 cm on the left, previously measuring up to 1.3 cm
and 3.5 cm respectively on prior CT in [DATE].

## 2022-05-06 MED ORDER — MENTHOL 3 MG MT LOZG: 1.0000 | LOZENGE | OROMUCOSAL | Status: DC | PRN

## 2022-05-06 MED ORDER — IPRATROPIUM-ALBUTEROL 0.5-2.5 (3) MG/3ML IN SOLN: 3.0000 mL | Freq: Two times a day (BID) | RESPIRATORY_TRACT | Status: DC

## 2022-05-06 MED ORDER — ALBUMIN HUMAN 5 % IV SOLN
12.5000 g | Freq: Once | INTRAVENOUS | Status: AC
  Administered 2022-05-06: 12.5 g via INTRAVENOUS
  Filled 2022-05-06 (×2): qty 250

## 2022-05-06 MED ORDER — IPRATROPIUM-ALBUTEROL 0.5-2.5 (3) MG/3ML IN SOLN
3.0000 mL | Freq: Two times a day (BID) | RESPIRATORY_TRACT | Status: DC
  Administered 2022-05-06 – 2022-05-08 (×4): 3 mL via RESPIRATORY_TRACT
  Filled 2022-05-06 (×4): qty 3

## 2022-05-06 MED ORDER — BUDESONIDE 0.25 MG/2ML IN SUSP
0.2500 mg | Freq: Two times a day (BID) | RESPIRATORY_TRACT | Status: DC
  Administered 2022-05-06 – 2022-05-08 (×4): 0.25 mg via RESPIRATORY_TRACT
  Filled 2022-05-06 (×4): qty 2

## 2022-05-06 NOTE — Progress Notes (Signed)
[facility] Progress Note Patient: David Huynh EXH:[id number] DOB: [birth year] DOA: 04/26/2022  DOS: the patient was seen and examined on 05/06/2022  Brief hospital course: Mr. Hoecker is an 81 y.o. M with sdCHF EF 45-50%, pAF on Eliquis, obesity, DM, CKD IIIb, HTN and hx amio lung fibrosis who presented with progressive SOB and edema. Wife reported poor compliance with meds. 12/13: Admitted in respiratory distress on BiPAP, Lasix started; Cardiology consulted 12/14: Still on BiPAP; Pulmonology consulted; underwent thoracentesis 12/15: Breathing improved 12/16: Repeat thoracentesis 12/17: Nephrology consulted 12/18: Transitioned to oral diuretics 12/19: Urinary retention, antibiotics started 12/20: Cr up again, given albumin 12/23 renal function was improving initially after albumin worsened again.  Currently 3.37, highest reported creatinine for the patient in our system.  Discharge is currently canceled.  Patient received albumin again.  Assessment and Plan: Acute on chronic combined systolic and//CHF Echocardiogram shows EF of 35 to 40%.  Past EF was 40 to 45%. Oxygenation improving. Treated with IV Lasix gtt. and Zaroxolyn. Has not received any diuretic therapy since last 5 days and creatinine continues to worsen. Still has swelling in the legs. Plan was to continue torsemide every other day but due to worsening serum creatinine unable to safely continue torsemide until there is stability. Monitor for now.  Acute respite failure with hypoxia. Bilateral pleural effusion History of pulmonary fibrosis. Mild persistent asthma Underwent thoracentesis.  Transudative in nature. Currently oxygenation stable.  Monitor. Required BiPAP therapy on the admission. Continue as needed inhalers and nebulizer therapy.  Influenza. Currently Tamiflu for Bilateral expiratory wheezing has resolved. Was on prednisone now discontinued. Monitor.   Nephrotic syndrome. Acute kidney  injury on CKD 3A. Acute urinary retention Baseline creatinine around 1.6.  Creatinine peaked currently at 3.37. Nephrology was consulted. Foley catheter was placed for urinary retention. Ultrasound renal negative for any hydronephrosis for now. Serologies sent for GI.  Currently workup pending.  Monitor.  Acute E. coli UTI. On oral therapy. Monitor.  Chronic A-fib with RVR. Patient was on amiodarone but currently not on it due to pulmonary toxicity. On Eliquis which I will continue. On metoprolol which is currently being continued Rate currently controlled EP was consulted and patient not a candidate for ablation.  Outpatient follow-up recommended.  HLD. Continue statins per  Obesity Body mass index is 33.91 kg/m.  Placing the pt at higher risk of poor outcomes.  Disposition. Patient is frustrated that he is not being discharged on.  12/23 Explained to him that his recurrent creatinine has trended up significantly which can adversely affect his medications regimen. Not only that, there is also no clarity on the dose of diuretic therapy that will help maintain his volume. For now we will monitor.  Type 2 diabetes mellitus, uncontrolled with hyperglycemia Hemoglobin A1c of 5.8. On metformin at home. Original plan is to discharge with Lantus. Will monitor for now.   Subjective: No nausea no vomiting.  No fever no chills.  No chest pain.  No shortness of breath.  Frustrated that he is not still has to stay in the hospital.  Still has swelling in his legs.  Cough improving.  Physical Exam: General: in mild distress;  Cardiovascular: S1 and S2 Present, aortic systolic  Murmur Respiratory: Increased respiratory effort, Bilateral Air entry present, basal Crackles, no wheezes Abdomen: Bowel Sound present, Non tender  Extremities: Bilateral edema Neurology: alert and oriented to time, place, and person   Data Reviewed: I have Reviewed nursing notes, Vitals, and Lab  results. Since last  encounter, pertinent lab results CBC and BMP   . I have ordered test including CBC and BMP  .   Disposition: Status is: Inpatient Remains inpatient appropriate because: Need to monitor renal function closely  apixaban (ELIQUIS) tablet 2.5 mg Start: 04/26/22 1000 apixaban (ELIQUIS) tablet 2.5 mg   Family Communication: No one at bedside Level of care: Telemetry Medical Continue telemetry due to A-fib Vitals:   05/06/22 0037 05/06/22 0411 05/06/22 0603 05/06/22 0936  BP: 135/80 127/81  126/76  Pulse: 73 76  77  Resp: 20 20  18   Temp: (!) 97.5 F (36.4 C) 97.9 F (36.6 C)  98 F (36.7 C)  TempSrc: Oral Oral  Oral  SpO2: 92% 92% 92% 95%  Weight: 113.4 kg     Height:         Author: , MD 05/06/2022 6:11 PM  Please look on [email or web address] to find out who is on call.

## 2022-05-06 NOTE — Progress Notes (Signed)
Mobility Specialist Progress Note:   05/06/22 1341  Mobility  Activity Ambulated with assistance in room  Level of Assistance Contact guard assist, steadying assist  Assistive Device Front wheel walker  Distance Ambulated (ft) 20 ft  Activity Response Tolerated well  Mobility Referral Yes  $Mobility charge 1 Mobility   Pt received in bed and agreeable. C/o right knee pain. Pt left in chair with all needs met, call bell in reach, and RN in room.   Andrey Campanile Mobility Specialist Please contact via SecureChat or  Rehab office at [phone]

## 2022-05-06 NOTE — Progress Notes (Signed)
Pt refuses CPAP at this time on room air and stable. Will continue to monitor for any changes

## 2022-05-07 DIAGNOSIS — I5043 Acute on chronic combined systolic (congestive) and diastolic (congestive) heart failure: Secondary | ICD-10-CM | POA: Diagnosis not present

## 2022-05-07 LAB — CBC
HCT: 36.6 % — ABNORMAL LOW (ref 39.0–52.0)
Hemoglobin: 11.6 g/dL — ABNORMAL LOW (ref 13.0–17.0)
MCH: 29.4 pg (ref 26.0–34.0)
MCHC: 31.7 g/dL (ref 30.0–36.0)
MCV: 92.7 fL (ref 80.0–100.0)
Platelets: 435 10*3/uL — ABNORMAL HIGH (ref 150–400)
RBC: 3.95 MIL/uL — ABNORMAL LOW (ref 4.22–5.81)
RDW: 14.1 % (ref 11.5–15.5)
WBC: 9.2 10*3/uL (ref 4.0–10.5)
nRBC: 0 % (ref 0.0–0.2)

## 2022-05-07 LAB — RENAL FUNCTION PANEL
Albumin: 1.6 g/dL — ABNORMAL LOW (ref 3.5–5.0)
Anion gap: 10 (ref 5–15)
BUN: 52 mg/dL — ABNORMAL HIGH (ref 8–23)
CO2: 24 mmol/L (ref 22–32)
Calcium: 8 mg/dL — ABNORMAL LOW (ref 8.9–10.3)
Chloride: 100 mmol/L (ref 98–111)
Creatinine, Ser: 3.34 mg/dL — ABNORMAL HIGH (ref 0.61–1.24)
GFR, Estimated: 18 mL/min — ABNORMAL LOW (ref >60)
Glucose, Bld: 190 mg/dL — ABNORMAL HIGH (ref 70–99)
Phosphorus: 3.3 mg/dL (ref 2.5–4.6)
Potassium: 3.6 mmol/L (ref 3.5–5.1)
Sodium: 134 mmol/L — ABNORMAL LOW (ref 135–145)

## 2022-05-07 LAB — GLUCOSE, CAPILLARY
Glucose-Capillary: 159 mg/dL — ABNORMAL HIGH (ref 70–99)
Glucose-Capillary: 197 mg/dL — ABNORMAL HIGH (ref 70–99)
Glucose-Capillary: 222 mg/dL — ABNORMAL HIGH (ref 70–99)
Glucose-Capillary: 194 mg/dL — ABNORMAL HIGH (ref 70–99)

## 2022-05-07 LAB — COOXEMETRY PANEL
Carboxyhemoglobin: 1.7 % — ABNORMAL HIGH (ref 0.5–1.5)
Methemoglobin: <0.7 % (ref 0.0–1.5)
O2 Saturation: 61.3 %
Total hemoglobin: 14.7 g/dL (ref 12.0–16.0)

## 2022-05-07 LAB — MAGNESIUM: Magnesium: 2.4 mg/dL (ref 1.7–2.4)

## 2022-05-07 MED ORDER — ALBUMIN HUMAN 25 % IV SOLN
12.5000 g | Freq: Once | INTRAVENOUS | Status: AC
  Administered 2022-05-07: 12.5 g via INTRAVENOUS
  Filled 2022-05-07: qty 50

## 2022-05-07 MED ORDER — PREDNISONE 20 MG PO TABS
30.0000 mg | ORAL_TABLET | Freq: Every day | ORAL | Status: DC
  Administered 2022-05-08: 30 mg via ORAL
  Filled 2022-05-07: qty 1

## 2022-05-07 MED ORDER — PROSOURCE PLUS PO LIQD
30.0000 mL | Freq: Two times a day (BID) | ORAL | Status: DC
  Administered 2022-05-07 – 2022-05-08 (×3): 30 mL via ORAL
  Filled 2022-05-07 (×2): qty 30

## 2022-05-07 MED ORDER — TORSEMIDE 20 MG PO TABS
20.0000 mg | ORAL_TABLET | Freq: Once | ORAL | Status: AC
  Administered 2022-05-07: 20 mg via ORAL
  Filled 2022-05-07: qty 1

## 2022-05-07 NOTE — Progress Notes (Signed)
Mobility Specialist - Progress Note   05/07/22 1010  Mobility  Activity Refused mobility   Pt refused stating he wanted to get more sleep. Will f/u if time permits.   David Huynh  Mobility Specialist Please contact via Special educational needs teacher or Rehab office at [phone]

## 2022-05-07 NOTE — TOC Progression Note (Signed)
Transition of Care [facility]) - Progression Note    Patient Details  Name: TILTON MARSALIS MRN: [id number] Date of Birth: [birth year]  [facility] of [facility]) CM/SW Contact  Patrice Paradise, LCSW Phone Number: 05/07/2022, 9:57 AM  Clinical Narrative:     CSW was alerted about pending DC for pt today. CSW called [place] however she is on vacation. On her VM there were 2 others number listed however both appear to be desk phone numbers. CSW will continue to reach out. TOC team will continue to assist with discharge planning needs.   Expected Discharge Plan: Skilled Nursing Facility Barriers to Discharge: Continued Medical Work up  Expected Discharge Plan and Services   Discharge Planning Services: CM Consult Post Acute Care Choice: Skilled Nursing Facility Living arrangements for the past 2 months: Independent Living Facility                                       Social Determinants of Health (SDOH) Interventions SDOH Screenings   Food Insecurity: No Food Insecurity (05/01/2022)  Housing: Low Risk  (05/01/2022)  Transportation Needs: No Transportation Needs (05/01/2022)  Utilities: Not At Risk (05/01/2022)  Depression (PHQ2-9): Low Risk  (11/08/2020)  Financial Resource Strain: Low Risk  (05/01/2022)  Tobacco Use: Low Risk  (04/26/2022)    Readmission Risk Interventions   Row Labels 08/20/2020   11:37 AM  Readmission Risk Prevention Plan   Section Header. No data exists in this row.   Transportation Screening   Complete  PCP or Specialist Appt within 3-5 Days   Complete  HRI or Home Care Consult   Complete  Social Work Consult for Recovery Care Planning/Counseling   Complete  Palliative Care Screening   Not Applicable  Medication Review Oceanographer)   Complete

## 2022-05-07 NOTE — Progress Notes (Signed)
[facility] Progress Note Patient: David Huynh JHE:[id number] DOB: [birth year] DOA: 04/26/2022  DOS: the patient was seen and examined on 05/07/2022  Brief hospital course: David Huynh is an 81 y.o. M with sdCHF EF 45-50%, pAF on Eliquis, obesity, DM, CKD IIIb, HTN and hx amio lung fibrosis who presented with progressive SOB and edema. Wife reported poor compliance with meds. 12/13: Admitted in respiratory distress on BiPAP, Lasix started; Cardiology consulted 12/14: Still on BiPAP; Pulmonology consulted; underwent thoracentesis 12/15: Breathing improved 12/16: Repeat thoracentesis 12/17: Nephrology consulted 12/18: Transitioned to oral diuretics 12/19: Urinary retention, antibiotics started 12/20: Cr up again, given albumin 12/23 renal function was improving initially after albumin worsened again.  Currently 3.37, highest reported creatinine for the patient in our system.  Discharge is currently canceled.  Patient received albumin again.  Assessment and Plan: Acute on chronic combined systolic and//CHF Echocardiogram shows EF of 35 to 40%.  Past EF was 40 to 45%. Oxygenation improving. Treated with IV Lasix gtt. and Zaroxolyn. Has not received any diuretic therapy since last 5 days and creatinine continues to worsen. Still has swelling in the legs. Plan was to continue torsemide every other day but due to worsening serum creatinine unable to safely continue torsemide until there is stability. Will attempt torsemide with IV albumin support.  Monitor serum creatinine tomorrow. Monitor for now.  Acute respite failure with hypoxia. Bilateral pleural effusion History of pulmonary fibrosis. Mild persistent asthma Underwent thoracentesis.  Transudative in nature. Currently oxygenation stable.  Monitor. Required BiPAP therapy on the admission. Continue as needed inhalers and nebulizer therapy.  Influenza. Currently Tamiflu for Bilateral expiratory wheezing has resolved. Was  on prednisone now discontinued. Monitor.  Nephrotic syndrome. Acute kidney injury on CKD 3A. Acute urinary retention Baseline creatinine around 1.6.  Creatinine peaked currently at 3.37. Nephrology was consulted. Foley catheter was placed for urinary retention. Ultrasound renal negative for any hydronephrosis for now. Serologies sent for GN.  ANCA negative.  ANA negative.  GBM antibody negative.  ASO negative.   Not a candidate for biopsy.  Monitor.  Acute E. coli UTI. On oral therapy. Monitor.  Chronic A-fib with RVR. Patient was on amiodarone but currently not on it due to pulmonary toxicity. On Eliquis which I will continue. On metoprolol which is currently being continued Rate currently controlled EP was consulted and patient not a candidate for ablation.  Outpatient follow-up recommended.  HLD. Continue statins per  Obesity Body mass index is 34.41 kg/m.  Placing the pt at higher risk of poor outcomes.  Type 2 diabetes mellitus, uncontrolled with hyperglycemia Hemoglobin A1c of 5.8. On metformin at home. Original plan is to discharge with Lantus. Will monitor for now.   Subjective: No nausea or vomiting.  No fever no chills.  Still has swelling in his legs.  Breathing stable.  Physical Exam: General: Appear in mild distress; Cardiovascular: S1 and S2 Present, no Murmur, Respiratory: good respiratory effort, Bilateral Air entry present, CTA, no Crackles, no wheezes Abdomen: Bowel Sound present, Non tender  Extremities: bilateral Pedal edema Neurology: alert and oriented to time, place, and person   Data Reviewed: I have Reviewed nursing notes, Vitals, and Lab results. Since last encounter, pertinent lab results CBC and BMP   . I have ordered test including CBC and BMP  .    Disposition: Status is: Inpatient Remains inpatient appropriate because: Need to monitor renal function closely  apixaban (ELIQUIS) tablet 2.5 mg Start: 04/26/22 1000 apixaban (ELIQUIS)  tablet 2.5 mg  Family Communication: No one at bedside Level of care: Telemetry Medical Continue telemetry due to A-fib Vitals:   05/07/22 0737 05/07/22 0900 05/07/22 1132 05/07/22 1626  BP:  134/79 (!) 141/84 (!) 143/83  Pulse:   80 77  Resp:   18 (!) 24  Temp:   97.8 F (36.6 C) 97.8 F (36.6 C)  TempSrc:   Oral Oral  SpO2: 95%  90% 90%  Weight:      Height:         Author: Berle Mull, MD 05/07/2022 7:13 PM  Please look on [email or web address] to find out who is on call.

## 2022-05-07 NOTE — Plan of Care (Signed)
  Problem: Coping: Goal: Ability to adjust to condition or change in health will improve Outcome: Progressing   Problem: Health Behavior/Discharge Planning: Goal: Ability to identify and utilize available resources and services will improve Outcome: Progressing Goal: Ability to manage health-related needs will improve Outcome: Progressing   Problem: Nutritional: Goal: Maintenance of adequate nutrition will improve Outcome: Progressing Goal: Progress toward achieving an optimal weight will improve Outcome: Progressing   Problem: Skin Integrity: Goal: Risk for impaired skin integrity will decrease Outcome: Progressing   Problem: Education: Goal: Knowledge of General Education information will improve Description: Including pain rating scale, medication(s)/side effects and non-pharmacologic comfort measures Outcome: Progressing   Problem: Health Behavior/Discharge Planning: Goal: Ability to manage health-related needs will improve Outcome: Progressing   Problem: Clinical Measurements: Goal: Will remain free from infection Outcome: Progressing Goal: Respiratory complications will improve Outcome: Progressing Goal: Cardiovascular complication will be avoided Outcome: Progressing   Problem: Activity: Goal: Risk for activity intolerance will decrease Outcome: Progressing   Problem: Coping: Goal: Level of anxiety will decrease Outcome: Progressing   Problem: Elimination: Goal: Will not experience complications related to bowel motility Outcome: Progressing Goal: Will not experience complications related to urinary retention Outcome: Progressing   Problem: Safety: Goal: Ability to remain free from injury will improve Outcome: Progressing   Problem: Fluid Volume: Goal: Ability to maintain a balanced intake and output will improve Outcome: Not Progressing   Problem: Metabolic: Goal: Ability to maintain appropriate glucose levels will improve Outcome: Not  Progressing   Problem: Clinical Measurements: Goal: Ability to maintain clinical measurements within normal limits will improve Outcome: Not Progressing Goal: Diagnostic test results will improve Outcome: Not Progressing

## 2022-05-08 DIAGNOSIS — E1142 Type 2 diabetes mellitus with diabetic polyneuropathy: Secondary | ICD-10-CM | POA: Diagnosis not present

## 2022-05-08 DIAGNOSIS — I350 Nonrheumatic aortic (valve) stenosis: Secondary | ICD-10-CM | POA: Diagnosis not present

## 2022-05-08 DIAGNOSIS — I5043 Acute on chronic combined systolic (congestive) and diastolic (congestive) heart failure: Secondary | ICD-10-CM | POA: Diagnosis not present

## 2022-05-08 DIAGNOSIS — J45909 Unspecified asthma, uncomplicated: Secondary | ICD-10-CM | POA: Diagnosis not present

## 2022-05-08 DIAGNOSIS — I480 Paroxysmal atrial fibrillation: Secondary | ICD-10-CM | POA: Diagnosis not present

## 2022-05-08 DIAGNOSIS — Z79899 Other long term (current) drug therapy: Secondary | ICD-10-CM | POA: Diagnosis not present

## 2022-05-08 DIAGNOSIS — I959 Hypotension, unspecified: Secondary | ICD-10-CM | POA: Diagnosis not present

## 2022-05-08 DIAGNOSIS — Z794 Long term (current) use of insulin: Secondary | ICD-10-CM | POA: Diagnosis not present

## 2022-05-08 DIAGNOSIS — Z960 Presence of urogenital implants: Secondary | ICD-10-CM | POA: Diagnosis not present

## 2022-05-08 DIAGNOSIS — R609 Edema, unspecified: Secondary | ICD-10-CM | POA: Diagnosis not present

## 2022-05-08 DIAGNOSIS — J309 Allergic rhinitis, unspecified: Secondary | ICD-10-CM | POA: Diagnosis not present

## 2022-05-08 DIAGNOSIS — N189 Chronic kidney disease, unspecified: Secondary | ICD-10-CM | POA: Diagnosis not present

## 2022-05-08 DIAGNOSIS — Z7984 Long term (current) use of oral hypoglycemic drugs: Secondary | ICD-10-CM | POA: Diagnosis not present

## 2022-05-08 DIAGNOSIS — Z7401 Bed confinement status: Secondary | ICD-10-CM | POA: Diagnosis not present

## 2022-05-08 DIAGNOSIS — R2681 Unsteadiness on feet: Secondary | ICD-10-CM | POA: Diagnosis not present

## 2022-05-08 DIAGNOSIS — Z7951 Long term (current) use of inhaled steroids: Secondary | ICD-10-CM | POA: Diagnosis not present

## 2022-05-08 DIAGNOSIS — I5030 Unspecified diastolic (congestive) heart failure: Secondary | ICD-10-CM | POA: Diagnosis not present

## 2022-05-08 DIAGNOSIS — E785 Hyperlipidemia, unspecified: Secondary | ICD-10-CM | POA: Diagnosis not present

## 2022-05-08 DIAGNOSIS — R000 Tachycardia, unspecified: Secondary | ICD-10-CM | POA: Diagnosis not present

## 2022-05-08 DIAGNOSIS — M6281 Muscle weakness (generalized): Secondary | ICD-10-CM | POA: Diagnosis not present

## 2022-05-08 DIAGNOSIS — J90 Pleural effusion, not elsewhere classified: Secondary | ICD-10-CM | POA: Diagnosis not present

## 2022-05-08 DIAGNOSIS — R338 Other retention of urine: Secondary | ICD-10-CM | POA: Diagnosis not present

## 2022-05-08 DIAGNOSIS — E569 Vitamin deficiency, unspecified: Secondary | ICD-10-CM | POA: Diagnosis not present

## 2022-05-08 DIAGNOSIS — I4821 Permanent atrial fibrillation: Secondary | ICD-10-CM | POA: Diagnosis not present

## 2022-05-08 DIAGNOSIS — J9601 Acute respiratory failure with hypoxia: Secondary | ICD-10-CM | POA: Diagnosis not present

## 2022-05-08 DIAGNOSIS — D6869 Other thrombophilia: Secondary | ICD-10-CM | POA: Diagnosis not present

## 2022-05-08 DIAGNOSIS — E1122 Type 2 diabetes mellitus with diabetic chronic kidney disease: Secondary | ICD-10-CM | POA: Diagnosis not present

## 2022-05-08 DIAGNOSIS — R42 Dizziness and giddiness: Secondary | ICD-10-CM | POA: Diagnosis not present

## 2022-05-08 DIAGNOSIS — I255 Ischemic cardiomyopathy: Secondary | ICD-10-CM | POA: Diagnosis not present

## 2022-05-08 DIAGNOSIS — I080 Rheumatic disorders of both mitral and aortic valves: Secondary | ICD-10-CM | POA: Diagnosis not present

## 2022-05-08 DIAGNOSIS — I4819 Other persistent atrial fibrillation: Secondary | ICD-10-CM | POA: Diagnosis not present

## 2022-05-08 DIAGNOSIS — N049 Nephrotic syndrome with unspecified morphologic changes: Secondary | ICD-10-CM | POA: Diagnosis not present

## 2022-05-08 DIAGNOSIS — E669 Obesity, unspecified: Secondary | ICD-10-CM | POA: Diagnosis not present

## 2022-05-08 DIAGNOSIS — D649 Anemia, unspecified: Secondary | ICD-10-CM | POA: Diagnosis not present

## 2022-05-08 DIAGNOSIS — R944 Abnormal results of kidney function studies: Secondary | ICD-10-CM | POA: Diagnosis not present

## 2022-05-08 DIAGNOSIS — E8809 Other disorders of plasma-protein metabolism, not elsewhere classified: Secondary | ICD-10-CM | POA: Diagnosis not present

## 2022-05-08 DIAGNOSIS — R55 Syncope and collapse: Secondary | ICD-10-CM | POA: Diagnosis not present

## 2022-05-08 DIAGNOSIS — I130 Hypertensive heart and chronic kidney disease with heart failure and stage 1 through stage 4 chronic kidney disease, or unspecified chronic kidney disease: Secondary | ICD-10-CM | POA: Diagnosis not present

## 2022-05-08 DIAGNOSIS — Z8616 Personal history of COVID-19: Secondary | ICD-10-CM | POA: Diagnosis not present

## 2022-05-08 DIAGNOSIS — L853 Xerosis cutis: Secondary | ICD-10-CM | POA: Diagnosis not present

## 2022-05-08 DIAGNOSIS — E118 Type 2 diabetes mellitus with unspecified complications: Secondary | ICD-10-CM | POA: Diagnosis not present

## 2022-05-08 DIAGNOSIS — I10 Essential (primary) hypertension: Secondary | ICD-10-CM | POA: Diagnosis not present

## 2022-05-08 DIAGNOSIS — J8410 Pulmonary fibrosis, unspecified: Secondary | ICD-10-CM | POA: Diagnosis not present

## 2022-05-08 DIAGNOSIS — Z9181 History of falling: Secondary | ICD-10-CM | POA: Diagnosis not present

## 2022-05-08 DIAGNOSIS — Z955 Presence of coronary angioplasty implant and graft: Secondary | ICD-10-CM | POA: Diagnosis not present

## 2022-05-08 DIAGNOSIS — R339 Retention of urine, unspecified: Secondary | ICD-10-CM | POA: Diagnosis not present

## 2022-05-08 DIAGNOSIS — L602 Onychogryphosis: Secondary | ICD-10-CM | POA: Diagnosis not present

## 2022-05-08 DIAGNOSIS — R0600 Dyspnea, unspecified: Secondary | ICD-10-CM | POA: Diagnosis not present

## 2022-05-08 DIAGNOSIS — N183 Chronic kidney disease, stage 3 unspecified: Secondary | ICD-10-CM | POA: Diagnosis not present

## 2022-05-08 DIAGNOSIS — R2689 Other abnormalities of gait and mobility: Secondary | ICD-10-CM | POA: Diagnosis not present

## 2022-05-08 DIAGNOSIS — I2510 Atherosclerotic heart disease of native coronary artery without angina pectoris: Secondary | ICD-10-CM | POA: Diagnosis not present

## 2022-05-08 DIAGNOSIS — I509 Heart failure, unspecified: Secondary | ICD-10-CM | POA: Diagnosis not present

## 2022-05-08 DIAGNOSIS — I4891 Unspecified atrial fibrillation: Secondary | ICD-10-CM | POA: Diagnosis not present

## 2022-05-08 DIAGNOSIS — I5042 Chronic combined systolic (congestive) and diastolic (congestive) heart failure: Secondary | ICD-10-CM | POA: Diagnosis not present

## 2022-05-08 DIAGNOSIS — R531 Weakness: Secondary | ICD-10-CM | POA: Diagnosis not present

## 2022-05-08 DIAGNOSIS — Z7901 Long term (current) use of anticoagulants: Secondary | ICD-10-CM | POA: Diagnosis not present

## 2022-05-08 LAB — RENAL FUNCTION PANEL
Albumin: 1.9 g/dL — ABNORMAL LOW (ref 3.5–5.0)
Anion gap: 10 (ref 5–15)
BUN: 58 mg/dL — ABNORMAL HIGH (ref 8–23)
CO2: 23 mmol/L (ref 22–32)
Calcium: 8 mg/dL — ABNORMAL LOW (ref 8.9–10.3)
Chloride: 103 mmol/L (ref 98–111)
Creatinine, Ser: 2.93 mg/dL — ABNORMAL HIGH (ref 0.61–1.24)
GFR, Estimated: 21 mL/min — ABNORMAL LOW (ref >60)
Glucose, Bld: 129 mg/dL — ABNORMAL HIGH (ref 70–99)
Phosphorus: 3.2 mg/dL (ref 2.5–4.6)
Potassium: 3.7 mmol/L (ref 3.5–5.1)
Sodium: 136 mmol/L (ref 135–145)

## 2022-05-08 LAB — MAGNESIUM: Magnesium: 2.4 mg/dL (ref 1.7–2.4)

## 2022-05-08 LAB — COOXEMETRY PANEL
Carboxyhemoglobin: 2.4 % — ABNORMAL HIGH (ref 0.5–1.5)
Methemoglobin: <0.7 % (ref 0.0–1.5)
O2 Saturation: 62.1 %
Total hemoglobin: 6.9 g/dL — CL (ref 12.0–16.0)

## 2022-05-08 LAB — CBC
HCT: 37.6 % — ABNORMAL LOW (ref 39.0–52.0)
Hemoglobin: 11.9 g/dL — ABNORMAL LOW (ref 13.0–17.0)
MCH: 29.1 pg (ref 26.0–34.0)
MCHC: 31.6 g/dL (ref 30.0–36.0)
MCV: 91.9 fL (ref 80.0–100.0)
Platelets: 502 10*3/uL — ABNORMAL HIGH (ref 150–400)
RBC: 4.09 MIL/uL — ABNORMAL LOW (ref 4.22–5.81)
RDW: 14.2 % (ref 11.5–15.5)
WBC: 13 10*3/uL — ABNORMAL HIGH (ref 4.0–10.5)
nRBC: 0 % (ref 0.0–0.2)

## 2022-05-08 LAB — GLUCOSE, CAPILLARY
Glucose-Capillary: 128 mg/dL — ABNORMAL HIGH (ref 70–99)
Glucose-Capillary: 203 mg/dL — ABNORMAL HIGH (ref 70–99)

## 2022-05-08 LAB — UREA NITROGEN, URINE: Urea Nitrogen, Ur: 555 mg/dL

## 2022-05-08 MED ORDER — MENTHOL 3 MG MT LOZG
1.0000 | LOZENGE | OROMUCOSAL | 12 refills | Status: AC | PRN
  Filled 2022-05-08: qty 100, fill #0

## 2022-05-08 MED ORDER — AMOXICILLIN 500 MG PO CAPS: 500.0000 mg | ORAL_CAPSULE | Freq: Two times a day (BID) | ORAL | 0 refills | Status: AC

## 2022-05-08 MED ORDER — ROSUVASTATIN CALCIUM 10 MG PO TABS: 10.0000 mg | ORAL_TABLET | Freq: Every day | ORAL | 0 refills | Status: AC

## 2022-05-08 MED ORDER — GLUCERNA SHAKE PO LIQD
237.0000 mL | Freq: Three times a day (TID) | ORAL | 0 refills | Status: AC
  Filled 2022-05-08: qty 10000, 14d supply, fill #0

## 2022-05-08 MED ORDER — GLUCERNA SHAKE PO LIQD: 237.0000 mL | Freq: Three times a day (TID) | ORAL | Status: DC

## 2022-05-08 MED ORDER — TORSEMIDE 20 MG PO TABS: 20.0000 mg | ORAL_TABLET | ORAL | Status: DC

## 2022-05-08 MED ORDER — TAMSULOSIN HCL 0.4 MG PO CAPS: 0.4000 mg | ORAL_CAPSULE | Freq: Every day | ORAL | 0 refills | Status: AC

## 2022-05-08 MED ORDER — PREDNISONE 20 MG PO TABS: ORAL_TABLET | ORAL | 0 refills | Status: AC

## 2022-05-08 MED ORDER — TORSEMIDE 20 MG PO TABS: 20.0000 mg | ORAL_TABLET | ORAL | 0 refills | Status: AC

## 2022-05-08 NOTE — Discharge Summary (Signed)
Physician Discharge Summary   Patient: David Huynh MRN: [id number] DOB: August 14, 1940  Admit date:     04/26/2022  Discharge date: 05/08/22  Discharge Physician: Berle Mull  PCP: [name], MD  Recommendations at discharge: Remove foley catheter on 05/12/2022. Follow up with urology in 1 week Follow up with PCP in 1 week, please check BMP at follow-up Nephrology Dr. Hollie Salk in 1 month CHF team Dr. Aundra Dubin in 3 weeks Follow-up with EP to eval for atrial tachycardia/A-fib ablation Continue UNNA boots, change Monday and Thursday.    Follow-up Information     [name], MD Follow up.   Specialty: Family Medicine Contact information: [address] [place] [address] [phone]         [facility] Follow up on 05/11/2022.   Specialty: Cardiology Why: [facility] 2:30 pm Entrance C, Free Valet Parking Contact information: [id number] [place]. Schedule an appointment as soon as possible for a visit in 1 week(s).   Why: for recurrent UTI in male and urinary retention Contact information: [place], [facility]. Schedule an appointment as soon as possible for a visit in 2 week(s).   Contact information: [address] [place] [address] [phone]                Discharge Diagnoses: Principal Problem:   Acute on chronic combined systolic and diastolic CHF (congestive heart failure) ([place]) Active Problems:   Atrial fibrillation with RVR (HCC)   Acute-on-chronic kidney injury ([place])   Nephrotic syndrome   Acute cystitis   Obesity   Hyperlipidemia with target LDL less than 70   Mild persistent asthma   Essential hypertension   Cardiomyopathy, ischemic   Aortic stenosis, mild   Acute  respiratory failure with hypoxia (HCC)   Type 2 diabetes mellitus with stage 3a chronic kidney disease, with long-term current use of insulin (HCC)   Bilateral pleural effusion   [place] associated Pulmonary fibrosis ([place])   Hypoalbuminemia  Hospital Course: David Huynh is an 81 y.o. M with sdCHF EF 45-50%, pAF on Eliquis, obesity, DM, CKD IIIb, HTN and hx amio lung fibrosis who presented with progressive SOB and edema. Wife reported poor compliance with meds. 12/13: Admitted in respiratory distress on BiPAP, Lasix started; Cardiology consulted 12/14: Still on BiPAP; Pulmonology consulted; underwent thoracentesis 12/15: Breathing improved 12/16: Repeat thoracentesis 12/17: Nephrology consulted 12/18: Transitioned to oral diuretics 12/19: Urinary retention, antibiotics started 12/20: Cr up again, given albumin 12/23 renal function was improving initially after albumin worsened again.  Currently 3.37, highest reported creatinine for the patient in our system.  Discharge is currently canceled.  Patient received albumin again.  Assessment and Plan  Acute on chronic combined systolic and diastolic CHF Echocardiogram shows EF of 35 to 40%.  Past EF was 40 to 45%. Treated with IV Lasix gtt. and Zaroxolyn. Also on unna boots.  Still has swelling in the legs. Now on torsemide, continue every other day, continue unna boots.  Monitor weekly BMP at SNF.  On toprol Xl. Further adjustment to GDMT limited by CKD   Acute respite failure with hypoxia. Bilateral pleural effusion History of pulmonary fibrosis. Mild persistent asthma Underwent thoracentesis x 2. Transudative in nature. Cytology negative for  malignancy Currently oxygenation stable. Required BiPAP therapy on the admission. Continue current scheduled inhalers along with as needed inhalers and nebulizer therapy.   Influenza. Completed 5 days of renally adjusted Tamiflu Bilateral expiratory wheezing has resolved. Continue prednisone  taper.   Nephrotic syndrome. Acute kidney injury on CKD 3A. Baseline creatinine around 1.6.  Creatinine peaked currently at 3.37. now trending down but not back to baseline.  Nephrology was consulted. Foley catheter was placed for urinary retention. Ultrasound renal negative for any hydronephrosis for now. Serologies sent for GN.  ANCA negative.  ANA negative.  GBM antibody negative.  ASO negative.   Not a candidate for biopsy for now per Nephrology. Monitor. Outpt follow up recommended.    Acute E. coli UTI. Causing retention  On oral therapy. Last day 12/26  Acute urinary retention Needed foley insertion on 12/19. Due to UTI. Recommend to remove foley on 12/29.  Need urology follow up due to retention and infection  Continue flomax   Chronic A-fib with RVR. Patient was on amiodarone but currently not on it due to pulmonary toxicity. On Eliquis which I will continue. On metoprolol which is currently being continued. Rate currently controlled No amiodarone with history of lung toxicity, anti-arrhythmic options limited as he also has AKI on CKD stage 3.    Not Tikosyn candidate with CKD. EP was consulted and patient not a candidate for ablation.  Outpatient follow-up recommended. Plan for outpatient follow up with Dr Myles Gip.    HLD. Continue statins per   Obesity Body mass index is 34.06 kg/m.  Placing the pt at higher risk of poor outcomes.   Type 2 diabetes mellitus, uncontrolled with hyperglycemia Hemoglobin A1c of 5.8. On metformin at home. Original plan is to discharge with Lantus. Will monitor for now.  Adult failure to thrive Due to CHF.  Continue glucerna. Unable to tolerate ensure or prosource.   Consultants:  Nephrology  Cardiology  Palliative care  EP  Procedures performed:  Bedside thoracentesis  Echocardiogram  Foley insertion 12/19  DISCHARGE MEDICATION: Allergies as of 05/08/2022       Reactions   Amiodarone Shortness Of Breath    Pulmonary toxicity        Medication List     STOP taking these medications    carvedilol 25 MG tablet Commonly known as: COREG   Entresto 24-26 MG Generic drug: sacubitril-valsartan   metFORMIN 1000 MG tablet Commonly known as: GLUCOPHAGE   spironolactone 25 MG tablet Commonly known as: ALDACTONE       TAKE these medications    acetaminophen 500 MG tablet Commonly known as: TYLENOL Take 1,000 mg by mouth every 6 (six) hours as needed for moderate pain or headache.   Advair Diskus 100-50 MCG/ACT Aepb Generic drug: fluticasone-salmeterol INHALE 1 PUFF INTO THE LUNGS TWICE DAILY What changed: See the new instructions.   albuterol 108 (90 Base) MCG/ACT inhaler Commonly known as: VENTOLIN HFA Inhale 2 puffs into the lungs every 6 (six) hours as needed for wheezing or shortness of breath.   amoxicillin 500 MG capsule Commonly known as: AMOXIL Take 1 capsule (500 mg total) by mouth 2 (two) times daily for 2 days.   apixaban 2.5 MG Tabs tablet Commonly known as: Eliquis Take 1 tablet (2.5 mg total) by mouth 2 (two) times daily. TAKE 1 TABLET(5 MG) BY MOUTH TWICE DAILY What changed:  medication strength how much to take how to take this when to take this   cholecalciferol 1000 units tablet Commonly known  as: VITAMIN D Take 1 tablet (1,000 Units total) by mouth daily.   ezetimibe 10 MG tablet Commonly known as: ZETIA TAKE 1 TABLET(10 MG) BY MOUTH DAILY What changed: See the new instructions.   feeding supplement (GLUCERNA SHAKE) Liqd Take 237 mLs by mouth 3 (three) times daily between meals.   glucose blood test strip Use as instructed   HumaLOG KwikPen 100 UNIT/ML KwikPen Generic drug: insulin lispro USE AS DIRECTED. MAX DOSE OF 15 UNITS   icosapent Ethyl 1 g capsule Commonly known as: VASCEPA TAKE 2 CAPSULES(2 GRAMS) BY MOUTH TWICE DAILY What changed: See the new instructions.   Lantus SoloStar 100 UNIT/ML Solostar Pen Generic drug: insulin  glargine Inject 6 Units into the skin at bedtime. What changed: how much to take   loratadine 10 MG tablet Commonly known as: CLARITIN Take 1 tablet (10 mg total) by mouth daily.   menthol-cetylpyridinium 3 MG lozenge Commonly known as: CEPACOL Take 1 lozenge (3 mg total) by mouth as needed for sore throat.   metoprolol succinate 25 MG 24 hr tablet Commonly known as: TOPROL-XL Take 3 tablets (75 mg total) by mouth 2 (two) times daily.   nitroGLYCERIN 0.4 MG SL tablet Commonly known as: Nitrostat Place 1 tablet (0.4 mg total) under the tongue every 5 (five) minutes as needed for chest pain.   Pen Needles 32G X 4 MM Misc Inject 1 Device into the skin in the morning, at noon, in the evening, and at bedtime. Use 1 at bedtime with Lantus.  Dx code: E11.9   potassium chloride SA 20 MEQ tablet Commonly known as: KLOR-CON M Take 1 tablet (20 mEq total) by mouth every other day.   predniSONE 20 MG tablet Commonly known as: DELTASONE Take 20 Mg for 2 days followed by 10 Mg for 2 days and then stop   rosuvastatin 10 MG tablet Commonly known as: CRESTOR Take 1 tablet (10 mg total) by mouth daily. Start taking on: May 09, 2022 What changed:  medication strength how much to take how to take this when to take this additional instructions   tamsulosin 0.4 MG Caps capsule Commonly known as: FLOMAX Take 1 capsule (0.4 mg total) by mouth daily after breakfast.   torsemide 20 MG tablet Commonly known as: DEMADEX Take 1 tablet (20 mg total) by mouth every other day. Start taking on: May 09, 2022 What changed:  medication strength See the new instructions.               Durable Medical Equipment  (From admission, onward)           Start     Ordered   05/01/22 1214  For home use only DME 4 wheeled rolling walker with seat  Once       Question Answer Comment  Patient needs a walker to treat with the following condition Heart failure [facility])   Patient needs a  walker to treat with the following condition Limited mobility      05/01/22 1215           Disposition: SNF Diet recommendation: Cardiac diet  Discharge Exam: Vitals:   05/07/22 1927 05/07/22 1930 05/07/22 1934 05/08/22 0445  BP: (!) 144/91   (!) 138/92  Pulse: 74 78  79  Resp: 20   20  Temp: (!) 97.4 F (36.3 C)   (!) 97.4 F (36.3 C)  TempSrc: Oral   Oral  SpO2: 91% 93% 92% 92%  Weight:    113.9 kg  Height:       General: Appear in mild distress; no visible Abnormal Neck Mass Or lumps, Conjunctiva normal Cardiovascular: S1 and S2 Present, aortic systolic  Murmur, Respiratory: good respiratory effort, Bilateral Air entry present and CTA, faint basal Crackles, no wheezes Abdomen: Bowel Sound present, Non tender  Extremities: bilateral Pedal edema Neurology: alert and oriented to time, place, and person  Filed Weights   05/06/22 0037 05/07/22 0012 05/08/22 0445  Weight: 113.4 kg 115.1 kg 113.9 kg   Condition at discharge: stable  The results of significant diagnostics from this hospitalization (including imaging, microbiology, ancillary and laboratory) are listed below for reference.   Imaging Studies: DG CHEST PORT 1 VIEW  Result Date: 05/05/2022 CLINICAL DATA:  Dyspnea. EXAM: PORTABLE CHEST 1 VIEW COMPARISON:  April 29, 2022. FINDINGS: Stable cardiomegaly. Right-sided PICC line is unchanged in position. Increased right basilar opacity is noted concerning for pneumonia or edema and associated pleural effusion. Left lung is unremarkable. Bony thorax is unremarkable. IMPRESSION: Increased right basilar opacity as described above. Electronically Signed   By: Marijo Conception M.D.   On: 05/05/2022 09:43   DG Chest Port 1 View  Result Date: 04/29/2022 CLINICAL DATA:  Status post thoracentesis EXAM: PORTABLE CHEST 1 VIEW COMPARISON:  Same day chest radiograph FINDINGS: Stable cardiomegaly. Right PICC line remains in place. Significant improved aeration of the lung bases  with decreased pleural effusions status post thoracentesis. No pneumothorax. IMPRESSION: Significant improved aeration of the lung bases with decreased pleural effusions status post thoracentesis. No pneumothorax. Electronically Signed   By: Davina Poke D.O.   On: 04/29/2022 16:29   DG CHEST PORT 1 VIEW  Result Date: 04/29/2022 CLINICAL DATA:  Dyspnea. Right pleural effusion. Chronic kidney disease and coronary artery disease. EXAM: PORTABLE CHEST 1 VIEW COMPARISON:  04/27/2022 FINDINGS: Mild cardiomegaly remains stable. Right arm PICC line remains in appropriate position. Increased airspace opacity is seen in the mid and lower lungs bilaterally which is symmetric, and highly suspicious for worsening pulmonary edema. Probable small bilateral pleural effusions also noted. IMPRESSION: Increased airspace opacity in mid and lower lungs bilaterally, highly suspicious for worsening pulmonary edema. Probable small bilateral pleural effusions. Mild cardiomegaly. Electronically Signed   By: Marlaine Hind M.D.   On: 04/29/2022 08:43   DG CHEST PORT 1 VIEW  Result Date: 04/27/2022 CLINICAL DATA:  Post right thoracentesis EXAM: PORTABLE CHEST 1 VIEW COMPARISON:  04/26/2022 FINDINGS: Stable cardiomediastinal contours. Pulmonary vascular congestion with interstitial and alveolar opacities bilaterally, worsened within the left lung. Decreased right-sided pleural effusion status post thoracentesis with improved aeration in the right lung base. No pneumothorax. Stable right-sided PICC line. IMPRESSION: 1. Decreased right-sided pleural effusion status post thoracentesis. No pneumothorax. 2. Pulmonary vascular congestion with interstitial and alveolar opacities bilaterally, worsened within the left lung. Electronically Signed   By: Davina Poke D.O.   On: 04/27/2022 14:52   DG CHEST PORT 1 VIEW  Result Date: 04/26/2022 CLINICAL DATA:  Status post PICC line EXAM: PORTABLE CHEST 1 VIEW COMPARISON:  Chest x-ray  earlier same day FINDINGS: Right upper extremity PICC terminates over the distal SVC. There is no pneumothorax. The cardiomediastinal silhouette is within normal limits. There is central pulmonary vascular congestion and patchy bibasilar infiltrates with small pleural effusions similar to the prior study. The osseous structures are stable. IMPRESSION: 1. Right upper extremity PICC terminates over the distal SVC. 2. Central pulmonary vascular congestion and patchy bibasilar infiltrates with small pleural effusions similar to the prior study. Electronically  Signed   By: Ronney Asters M.D.   On: 04/26/2022 19:38   ECHOCARDIOGRAM COMPLETE  Result Date: 04/26/2022    ECHOCARDIOGRAM REPORT   Patient Name:   GLADSTONE ROSAS Date of Exam: 04/26/2022 Medical Rec #:  [id number]         Height:       72.0 in Accession #:    [id number]        Weight:       274.0 lb Date of Birth:  1941-04-15         BSA:          2.436 m Patient Age:    81 years          BP:           137/99 mmHg Patient Gender: M                 HR:           127 bpm. Exam Location:  Inpatient Procedure: 2D Echo, Cardiac Doppler, Color Doppler and Intracardiac            Opacification Agent Indications:    I50.40* Unspecified combined systolic (congestive) and diastolic                 (congestive) heart failure  History:        Patient has prior history of Echocardiogram examinations. CHF,                 CAD, Aortic Valve Disease, Signs/Symptoms:Dyspnea and Shortness                 of Breath; Risk Factors:Hypertension, Diabetes and Dyslipidemia.  Sonographer:    Roseanna Rainbow RDCS Referring Phys: [phone] JARED M GARDNER  Sonographer Comments: Technically difficult study due to poor echo windows, suboptimal parasternal window, suboptimal subcostal window and suboptimal apical window. Image acquisition challenging due to patient body habitus. Patient supine on CPAP, could not turn. Suboptimal study IMPRESSIONS  1. Vmax 2.5 m/s, MG 17 mmHG, AVA 1.50 cm2, DI  0.34. Suspect gradients lower than expected due to reduced EF. Based on AVA and DI, AS is moderate. The aortic valve is tricuspid. There is severe calcifcation of the aortic valve. There is severe thickening  of the aortic valve. Aortic valve regurgitation is not visualized. Moderate aortic valve stenosis. Aortic valve area, by VTI measures 1.50 cm. Aortic valve mean gradient measures 17.0 mmHg. Aortic valve Vmax measures 2.45 m/s.  2. Left ventricular ejection fraction, by estimation, is 40 to 45%. The left ventricle has mildly decreased function. The left ventricle demonstrates global hypokinesis. Left ventricular diastolic function could not be evaluated.  3. Right ventricular systolic function is mildly reduced. The right ventricular size is mildly enlarged. Tricuspid regurgitation signal is inadequate for assessing PA pressure.  4. Left atrial size was mildly dilated.  5. Moderate pleural effusion in the left lateral region.  6. The mitral valve is grossly normal. Mild mitral valve regurgitation. No evidence of mitral stenosis.  7. The inferior vena cava is dilated in size with <50% respiratory variability, suggesting right atrial pressure of 15 mmHg. Comparison(s): Changes from prior study are noted. EF 40-45% on this study. In Afib with RVR. Suspect moderate AS. FINDINGS  Left Ventricle: Left ventricular ejection fraction, by estimation, is 40 to 45%. The left ventricle has mildly decreased function. The left ventricle demonstrates global hypokinesis. The left ventricular internal cavity size was normal in size. There is  no left  ventricular hypertrophy. Left ventricular diastolic function could not be evaluated due to atrial fibrillation. Left ventricular diastolic function could not be evaluated. Right Ventricle: The right ventricular size is mildly enlarged. No increase in right ventricular wall thickness. Right ventricular systolic function is mildly reduced. Tricuspid regurgitation signal is inadequate  for assessing PA pressure. Left Atrium: Left atrial size was mildly dilated. Right Atrium: Right atrial size was normal in size. Pericardium: There is no evidence of pericardial effusion. Mitral Valve: The mitral valve is grossly normal. Mild mitral valve regurgitation. No evidence of mitral valve stenosis. Tricuspid Valve: The tricuspid valve is grossly normal. Tricuspid valve regurgitation is trivial. No evidence of tricuspid stenosis. Aortic Valve: Vmax 2.5 m/s, MG 17 mmHG, AVA 1.50 cm2, DI 0.34. Suspect gradients lower than expected due to reduced EF. Based on AVA and DI, AS is moderate. The aortic valve is tricuspid. There is severe calcifcation of the aortic valve. There is severe thickening of the aortic valve. Aortic valve regurgitation is not visualized. Moderate aortic stenosis is present. Aortic valve mean gradient measures 17.0 mmHg. Aortic valve peak gradient measures 24.1 mmHg. Aortic valve area, by VTI measures 1.50 cm. Pulmonic Valve: The pulmonic valve was grossly normal. Pulmonic valve regurgitation is not visualized. No evidence of pulmonic stenosis. Aorta: The aortic root and ascending aorta are structurally normal, with no evidence of dilitation. Venous: The inferior vena cava is dilated in size with less than 50% respiratory variability, suggesting right atrial pressure of 15 mmHg. IAS/Shunts: The interatrial septum was not well visualized. Additional Comments: There is a moderate pleural effusion in the left lateral region.  LEFT VENTRICLE PLAX 2D LVIDd:         5.20 cm      Diastology LVIDs:         4.60 cm      LV e' medial:    5.66 cm/s LV PW:         1.10 cm      LV E/e' medial:  17.1 LV IVS:        1.10 cm      LV e' lateral:   11.70 cm/s LVOT diam:     2.40 cm      LV E/e' lateral: 8.3 LV SV:         62 LV SV Index:   26 LVOT Area:     4.52 cm  LV Volumes (MOD) LV vol d, MOD A2C: 170.0 ml LV vol d, MOD A4C: 174.0 ml LV vol s, MOD A2C: 104.0 ml LV vol s, MOD A4C: 122.0 ml LV SV MOD A2C:      66.0 ml LV SV MOD A4C:     174.0 ml LV SV MOD BP:      55.7 ml RIGHT VENTRICLE            IVC RV S prime:     9.79 cm/s  IVC diam: 2.50 cm TAPSE (M-mode): 1.7 cm LEFT ATRIUM           Index        RIGHT ATRIUM           Index LA diam:      4.10 cm 1.68 cm/m   RA Area:     17.00 cm LA Vol (A2C): 19.4 ml 7.96 ml/m   RA Volume:   52.60 ml  21.60 ml/m LA Vol (A4C): 76.4 ml 31.37 ml/m  AORTIC VALVE AV Area (Vmax):    1.42 cm AV Area (Vmean):  1.39 cm AV Area (VTI):     1.50 cm AV Vmax:           245.33 cm/s AV Vmean:          173.333 cm/s AV VTI:            0.417 m AV Peak Grad:      24.1 mmHg AV Mean Grad:      17.0 mmHg LVOT Vmax:         77.10 cm/s LVOT Vmean:        53.300 cm/s LVOT VTI:          0.138 m LVOT/AV VTI ratio: 0.33  AORTA Ao Root diam: 3.70 cm Ao Asc diam:  3.75 cm MITRAL VALVE MV Area (PHT): 8.25 cm    SHUNTS MV Decel Time: 92 msec     Systemic VTI:  0.14 m MV E velocity: 96.90 cm/s  Systemic Diam: 2.40 cm Lennie Odor MD Electronically signed by Lennie Odor MD Signature Date/Time: 04/26/2022/11:29:11 AM    Final    [place] EKG SITE RITE  Result Date: 04/26/2022 If Site Rite image not attached, placement could not be confirmed due to current cardiac rhythm.  US RENAL  Result Date: 04/26/2022 CLINICAL DATA:  [id number] with acute kidney injury. EXAM: RENAL / URINARY TRACT ULTRASOUND COMPLETE COMPARISON:  Renal ultrasound 12/02/2020 FINDINGS: Right Kidney: Renal measurements: 11.4 x 5.2 x 5.5 cm = volume: 169.6 mL, previously 172 mL. Echogenicity within normal limits. No mass or hydronephrosis visualized. There is a 1.8 cm anechoic simple cyst in the superior pole of this kidney, previously 1.6 cm. In the inferior pole, a second simple cyst is noted measuring 2.1 cm, and could have developed in the interval or could have been obscured by bowel gas on the prior study, but was definitely not present on CT in 2016. Left Kidney: Renal measurements: 10.5 x 5.2 x 4.3 cm = volume: 124.0 mL,  previously 140 mL. Echogenicity within normal limits. No mass or hydronephrosis visualized. There previously was a nearly 6 cm simple cyst in the superior pole of this kidney, which is not seen today. No focal cortical abnormality is evident. Bladder: Appears normal for degree of bladder distention. Other: No free fluid is seen. IMPRESSION: 1. No hydronephrosis or visible stones. 2. Simple cysts in the right kidney. 3. Previously seen nearly 6 cm simple cyst in the superior pole of the left kidney is not seen today. 4. Bilaterally, no increased cortical echogenicity or significant renal thinning. Electronically Signed   By: Almira Bar M.D.   On: 04/26/2022 04:48   DG Chest Portable 1 View  Result Date: 04/26/2022 CLINICAL DATA:  Shortness of breath. EXAM: PORTABLE CHEST 1 VIEW COMPARISON:  Radiograph 12/28/2020. CT 01/24/2021 FINDINGS: Right pleural effusion appears increased from prior imaging. There is also increasing patchy opacity in the right mid lower lung zone. Possible small left pleural effusion which has also increased. Background chronic lung disease with parenchymal coarsening. Heart is upper normal in size, not well assessed on this portable exam. No pneumothorax. IMPRESSION: 1. Increasing right pleural effusion and patchy right mid and lower lung zone opacity. This may represent infection or progression of chronic lung disease. 2. Possible small left pleural effusion, also increased. Electronically Signed   By: Narda Rutherford M.D.   On: 04/26/2022 01:45    Microbiology: Results for orders placed or performed during the hospital encounter of 04/26/22  Resp panel by RT-PCR (RSV, Flu A&B, Covid) Anterior Nasal Swab  Status: None   Collection Time: 04/26/22 12:57 AM   Specimen: Anterior Nasal Swab  Result Value Ref Range Status   SARS Coronavirus 2 by RT PCR NEGATIVE NEGATIVE Final    Comment: (NOTE) SARS-CoV-2 target nucleic acids are NOT DETECTED.  The SARS-CoV-2 RNA is  generally detectable in upper respiratory specimens during the acute phase of infection. The lowest concentration of SARS-CoV-2 viral copies this assay can detect is 138 copies/mL. A negative result does not preclude SARS-Cov-2 infection and should not be used as the sole basis for treatment or other patient management decisions. A negative result may occur with  improper specimen collection/handling, submission of specimen other than nasopharyngeal swab, presence of viral mutation(s) within the areas targeted by this assay, and inadequate number of viral copies(<138 copies/mL). A negative result must be combined with clinical observations, patient history, and epidemiological information. The expected result is Negative.  Fact Sheet for Patients:  EntrepreneurPulse.com.au  Fact Sheet for Healthcare Providers:  IncredibleEmployment.be  This test is no t yet approved or cleared by the [place] FDA and  has been authorized for detection and/or diagnosis of SARS-CoV-2 by FDA under an Emergency Use Authorization (EUA). This EUA will remain  in effect (meaning this test can be used) for the duration of the COVID-19 declaration under Section 564(b)(1) of the Act, 21 U.S.C.section 360bbb-3(b)(1), unless the authorization is terminated  or revoked sooner.       Influenza A by PCR NEGATIVE NEGATIVE Final   Influenza B by PCR NEGATIVE NEGATIVE Final    Comment: (NOTE) The Xpert Xpress SARS-CoV-2/FLU/RSV plus assay is intended as an aid in the diagnosis of influenza from Nasopharyngeal swab specimens and should not be used as a sole basis for treatment. Nasal washings and aspirates are unacceptable for Xpert Xpress SARS-CoV-2/FLU/RSV testing.  Fact Sheet for Patients: EntrepreneurPulse.com.au  Fact Sheet for Healthcare Providers: IncredibleEmployment.be  This test is not yet approved or cleared by the [place] FDA and has been authorized for detection and/or diagnosis of SARS-CoV-2 by FDA under an Emergency Use Authorization (EUA). This EUA will remain in effect (meaning this test can be used) for the duration of the COVID-19 declaration under Section 564(b)(1) of the Act, 21 U.S.C. section 360bbb-3(b)(1), unless the authorization is terminated or revoked.     Resp Syncytial Virus by PCR NEGATIVE NEGATIVE Final    Comment: (NOTE) Fact Sheet for Patients: EntrepreneurPulse.com.au  Fact Sheet for Healthcare Providers: IncredibleEmployment.be  This test is not yet approved or cleared by the [place] FDA and has been authorized for detection and/or diagnosis of SARS-CoV-2 by FDA under an Emergency Use Authorization (EUA). This EUA will remain in effect (meaning this test can be used) for the duration of the COVID-19 declaration under Section 564(b)(1) of the Act, 21 U.S.C. section 360bbb-3(b)(1), unless the authorization is terminated or revoked.  Performed at [facility], [place] [address], [place], [place] [address]   Respiratory (~20 pathogens) panel by PCR     Status: None   Collection Time: 04/27/22 11:09 AM   Specimen: Nasopharyngeal Swab; Respiratory  Result Value Ref Range Status   Adenovirus NOT DETECTED NOT DETECTED Final   Coronavirus 229E NOT DETECTED NOT DETECTED Final    Comment: (NOTE) The Coronavirus on the Respiratory Panel, DOES NOT test for the novel  Coronavirus (2019 nCoV)    Coronavirus HKU1 NOT DETECTED NOT DETECTED Final   Coronavirus NL63 NOT DETECTED NOT DETECTED Final   Coronavirus OC43 NOT DETECTED NOT  DETECTED Final   Metapneumovirus NOT DETECTED NOT DETECTED Final   Rhinovirus / Enterovirus NOT DETECTED NOT DETECTED Final   Influenza A NOT DETECTED NOT DETECTED Final   Influenza B NOT DETECTED NOT DETECTED Final   Parainfluenza Virus 1 NOT DETECTED NOT DETECTED Final   Parainfluenza Virus 2 NOT  DETECTED NOT DETECTED Final   Parainfluenza Virus 3 NOT DETECTED NOT DETECTED Final   Parainfluenza Virus 4 NOT DETECTED NOT DETECTED Final   Respiratory Syncytial Virus NOT DETECTED NOT DETECTED Final   Bordetella pertussis NOT DETECTED NOT DETECTED Final   Bordetella Parapertussis NOT DETECTED NOT DETECTED Final   Chlamydophila pneumoniae NOT DETECTED NOT DETECTED Final   Mycoplasma pneumoniae NOT DETECTED NOT DETECTED Final    Comment: Performed at [facility], [place] [address] [place], [place] [address]  Body fluid culture w Gram Stain     Status: None   Collection Time: 04/27/22  1:31 PM   Specimen: Pleural Fluid  Result Value Ref Range Status   Specimen Description FLUID PLEURAL RIGHT  Final   Special Requests NONE  Final   Gram Stain   Final    WBC PRESENT, PREDOMINANTLY MONONUCLEAR NO ORGANISMS SEEN CYTOSPIN SMEAR    Culture   Final    NO GROWTH 3 DAYS Performed at [facility], [place] [address] [place], [place] [address]    Report Status 04/30/2022 FINAL  Final  Urine Culture     Status: Abnormal   Collection Time: 04/30/22  8:08 PM   Specimen: Urine, Clean Catch  Result Value Ref Range Status   Specimen Description URINE, CLEAN CATCH  Final   Special Requests   Final    NONE Performed at [facility], [place] [address], [place], [place] [address]    Culture >=100,000 COLONIES/mL ESCHERICHIA COLI (A)  Final   Report Status 05/02/2022 FINAL  Final   Organism ID, Bacteria ESCHERICHIA COLI (A)  Final      Susceptibility   Escherichia coli - MIC*    AMPICILLIN 8 SENSITIVE Sensitive     CEFAZOLIN 8 SENSITIVE Sensitive     CEFEPIME <=0.12 SENSITIVE Sensitive     CEFTRIAXONE 1 SENSITIVE Sensitive     CIPROFLOXACIN <=0.25 SENSITIVE Sensitive     GENTAMICIN <=1 SENSITIVE Sensitive     IMIPENEM <=0.25 SENSITIVE Sensitive     NITROFURANTOIN <=16 SENSITIVE Sensitive     TRIMETH/SULFA >=320 RESISTANT Resistant     AMPICILLIN/SULBACTAM <=2 SENSITIVE Sensitive      PIP/TAZO <=4 SENSITIVE Sensitive     * >=100,000 COLONIES/mL ESCHERICHIA COLI  Resp panel by RT-PCR (RSV, Flu A&B, Covid) Anterior Nasal Swab     Status: Abnormal   Collection Time: 05/04/22  9:40 AM   Specimen: Anterior Nasal Swab  Result Value Ref Range Status   SARS Coronavirus 2 by RT PCR NEGATIVE NEGATIVE Final    Comment: (NOTE) SARS-CoV-2 target nucleic acids are NOT DETECTED.  The SARS-CoV-2 RNA is generally detectable in upper respiratory specimens during the acute phase of infection. The lowest concentration of SARS-CoV-2 viral copies this assay can detect is 138 copies/mL. A negative result does not preclude SARS-Cov-2 infection and should not be used as the sole basis for treatment or other patient management decisions. A negative result may occur with  improper specimen collection/handling, submission of specimen other than nasopharyngeal swab, presence of viral mutation(s) within the areas targeted by this assay, and inadequate number of viral copies(<138 copies/mL). A negative result must  be combined with clinical observations, patient history, and epidemiological information. The expected result is Negative.  Fact Sheet for Patients:  EntrepreneurPulse.com.au  Fact Sheet for Healthcare Providers:  IncredibleEmployment.be  This test is no t yet approved or cleared by the [place] FDA and  has been authorized for detection and/or diagnosis of SARS-CoV-2 by FDA under an Emergency Use Authorization (EUA). This EUA will remain  in effect (meaning this test can be used) for the duration of the COVID-19 declaration under Section 564(b)(1) of the Act, 21 U.S.C.section 360bbb-3(b)(1), unless the authorization is terminated  or revoked sooner.       Influenza A by PCR POSITIVE (A) NEGATIVE Final   Influenza B by PCR NEGATIVE NEGATIVE Final    Comment: (NOTE) The Xpert Xpress SARS-CoV-2/FLU/RSV plus assay is intended as an  aid in the diagnosis of influenza from Nasopharyngeal swab specimens and should not be used as a sole basis for treatment. Nasal washings and aspirates are unacceptable for Xpert Xpress SARS-CoV-2/FLU/RSV testing.  Fact Sheet for Patients: EntrepreneurPulse.com.au  Fact Sheet for Healthcare Providers: IncredibleEmployment.be  This test is not yet approved or cleared by the [place] FDA and has been authorized for detection and/or diagnosis of SARS-CoV-2 by FDA under an Emergency Use Authorization (EUA). This EUA will remain in effect (meaning this test can be used) for the duration of the COVID-19 declaration under Section 564(b)(1) of the Act, 21 U.S.C. section 360bbb-3(b)(1), unless the authorization is terminated or revoked.     Resp Syncytial Virus by PCR NEGATIVE NEGATIVE Final    Comment: (NOTE) Fact Sheet for Patients: EntrepreneurPulse.com.au  Fact Sheet for Healthcare Providers: IncredibleEmployment.be  This test is not yet approved or cleared by the [place] FDA and has been authorized for detection and/or diagnosis of SARS-CoV-2 by FDA under an Emergency Use Authorization (EUA). This EUA will remain in effect (meaning this test can be used) for the duration of the COVID-19 declaration under Section 564(b)(1) of the Act, 21 U.S.C. section 360bbb-3(b)(1), unless the authorization is terminated or revoked.  Performed at [facility], [place] [address], [place], [place] [address]    Labs: CBC: Recent Labs  Lab 05/03/22 [id number] 05/04/22 0552 05/05/22 0500 05/07/22 0454 05/08/22 0500  WBC 10.4 8.6 8.8 9.2 13.0*  HGB 11.0* 11.0* 12.0* 11.6* 11.9*  HCT 34.2* 35.1* 36.8* 36.6* 37.6*  MCV 93.2 94.4 93.2 92.7 91.9  PLT 328 322 349 435* [id number]*   Basic Metabolic Panel: Recent Labs  Lab 05/04/22 0552 05/05/22 0008 05/05/22 0500 05/06/22 0513 05/07/22 0454 05/07/22 0518  05/08/22 0500  NA 134*  --  137 136  --  134* 136  K 3.3* 3.9 3.8 3.7  --  3.6 3.7  CL 98  --  101 101  --  100 103  CO2 28  --  28 26  --  24 23  GLUCOSE 97  --  117* 186*  --  190* 129*  BUN 50*  --  51* 55*  --  52* 58*  CREATININE 3.03*  --  3.14* 3.37*  --  3.34* 2.93*  CALCIUM 7.2*  --  7.8* 7.4*  --  8.0* 8.0*  MG  --  2.4  --   --  2.4  --  2.4  PHOS  --   --   --   --   --  3.3 3.2   Liver Function Tests: Recent Labs  Lab 05/07/22 0518 05/08/22 0500  ALBUMIN 1.6* 1.9*  CBG: Recent Labs  Lab 05/07/22 0646 05/07/22 1130 05/07/22 1809 05/07/22 2110 05/08/22 0550  GLUCAP 159* 197* 222* 194* 128*    Discharge time spent: greater than 30 minutes.  Signed: Berle Mull, MD [facility]

## 2022-05-08 NOTE — Progress Notes (Signed)
Rounding Note    Patient Name: David Huynh Date of Encounter: 05/08/2022  [facility] Cardiologist: [name], MD   Subjective   No complaints. No dyspnea.   Inpatient Medications    Scheduled Meds:  (feeding supplement) PROSource Plus  30 mL Oral BID BM   amoxicillin  500 mg Oral Q12H   apixaban  2.5 mg Oral BID   budesonide (PULMICORT) nebulizer solution  0.25 mg Nebulization BID   Chlorhexidine Gluconate Cloth  6 each Topical Daily   feeding supplement  237 mL Oral BID BM   insulin aspart  0-15 Units Subcutaneous TID WC   insulin aspart  0-5 Units Subcutaneous QHS   ipratropium-albuterol  3 mL Nebulization BID   loratadine  10 mg Oral Daily   metoprolol succinate  75 mg Oral BID   multivitamin with minerals  1 tablet Oral Daily   oseltamivir  30 mg Oral Daily   potassium chloride  20 mEq Oral Daily   predniSONE  30 mg Oral Q breakfast   rosuvastatin  10 mg Oral Daily   sodium chloride flush  10-40 mL Intracatheter Q12H   sodium chloride flush  3 mL Intravenous Q12H   tamsulosin  0.4 mg Oral QPC breakfast   Continuous Infusions:  sodium chloride Stopped (05/02/22 1150)   PRN Meds: sodium chloride, acetaminophen, levalbuterol, menthol-cetylpyridinium, ondansetron (ZOFRAN) IV, sodium chloride flush, sodium chloride flush   Vital Signs    Vitals:   05/07/22 1927 05/07/22 1930 05/07/22 1934 05/08/22 0445  BP: (!) 144/91   (!) 138/92  Pulse: 74 78  79  Resp: 20   20  Temp: (!) 97.4 F (36.3 C)   (!) 97.4 F (36.3 C)  TempSrc: Oral   Oral  SpO2: 91% 93% 92% 92%  Weight:    113.9 kg  Height:        Intake/Output Summary (Last 24 hours) at 05/08/2022 0733 Last data filed at 05/08/2022 0451 Gross per 24 hour  Intake 1127.89 ml  Output 3100 ml  Net -1972.11 ml      05/08/2022    4:45 AM 05/07/2022   12:12 AM 05/06/2022   12:37 AM  Last 3 Weights  Weight (lbs) 251 lb 1.7 oz 253 lb 12 oz 250 lb  Weight (kg) 113.9 kg 115.1 kg 113.4  kg      Telemetry    Sinus, short runs of AT - Personally Reviewed  ECG    No am ekg - Personally Reviewed  Physical Exam   GEN: No acute distress.   Neck: No JVD Cardiac: RRR, no murmurs, rubs, or gallops.  Respiratory: Clear to auscultation bilaterally. GI: Soft, nontender, non-distended  MS: No edema; No deformity. Neuro:  Nonfocal  Psych: Normal affect   Labs    High Sensitivity Troponin:   Recent Labs  Lab 04/26/22 0100 04/26/22 0306  TROPONINIHS 84* 98*     Chemistry Recent Labs  Lab 05/05/22 0008 05/05/22 0500 05/06/22 0513 05/07/22 0454 05/07/22 0518 05/08/22 0500  NA  --    < > 136  --  134* 136  K 3.9   < > 3.7  --  3.6 3.7  CL  --    < > 101  --  100 103  CO2  --    < > 26  --  24 23  GLUCOSE  --    < > 186*  --  190* 129*  BUN  --    < >  55*  --  52* 58*  CREATININE  --    < > 3.37*  --  3.34* 2.93*  CALCIUM  --    < > 7.4*  --  8.0* 8.0*  MG 2.4  --   --  2.4  --  2.4  ALBUMIN  --   --   --   --  1.6* 1.9*  GFRNONAA  --    < > 18*  --  18* 21*  ANIONGAP  --    < > 9  --  10 10   < > = values in this interval not displayed.    Lipids No results for input(s): "CHOL", "TRIG", "HDL", "LABVLDL", "LDLCALC", "CHOLHDL" in the last 168 hours.  Hematology Recent Labs  Lab 05/05/22 0500 05/07/22 0454 05/08/22 0500  WBC 8.8 9.2 13.0*  RBC 3.95* 3.95* 4.09*  HGB 12.0* 11.6* 11.9*  HCT 36.8* 36.6* 37.6*  MCV 93.2 92.7 91.9  MCH 30.4 29.4 29.1  MCHC 32.6 31.7 31.6  RDW 14.8 14.1 14.2  PLT 349 435* 502*   Thyroid No results for input(s): "TSH", "FREET4" in the last 168 hours.  BNPNo results for input(s): "BNP", "PROBNP" in the last 168 hours.  DDimer No results for input(s): "DDIMER" in the last 168 hours.   Radiology    No results found.  Cardiac Studies    Patient Profile     81 y.o. male with CAD, chronic systolic CHF, HTN, HLD, DM, CKD 3, asthma, and PAF w/ h/o amiodarone lung toxicity, admitted w/ a/c CHF and recurrent AF w/ RVR.    Assessment & Plan    Acute on chronic CHF: Prior ischemic cardiomyopathy, improved after PCI in 2017. Most recent LV dysfunction is felt to be tachycardia-mediated. He was admitted with with marked volume overload in setting of atrial tachycardia with RVR.  No evidence for ACS, suspect exacerbation may be due to atrial tachy/RVR. He had been lost to follow-up. Echo this admit EF 40-45%, RV mildly reduced, moderate AS. Initially with marked peripheral edema and oxygen requirement. He has diuresed well.   -He has been followed by our CHF team. Dr. Shirlee Latch felt that he was ready for discharge on Saturday. Plans in place for torsemide 20 mg every other day. Renal function is stable today and improving.  OK to discharge today from cardiac standpoint.   Atrial fib/Atrial tachycardia: Sinus today with runs of AT and VT noted over last 24 hours.  -Continue Eliquis and Toprol.   For questions or updates, please contact [facility] Please consult [email or web address] for contact info under        Signed, Verne Carrow, MD  05/08/2022, 7:33 AM

## 2022-05-08 NOTE — TOC Transition Note (Addendum)
Transition of Care [facility]) - CM/SW Discharge Note   Patient Details  Name: David Huynh MRN: [id number] Date of Birth: 30-Apr-1941  [facility] CM/SW Contact:  Eduard Roux, LCSW Phone Number: 05/08/2022, 10:52 AM   Clinical Narrative:     Patient will Discharge to: [facility] Discharge Date: 05/08/2022 Family Notified: spouse Transport ST:[facility] @ 2pm  Per MD patient is ready for discharge. RN, patient, and facility notified of discharge. Discharge Summary sent to facility. RN given number for [phone]. Ambulance transport requested for patient.   Clinical Social Worker signing off.   Final next level of care: Skilled Nursing Facility Barriers to Discharge: Barriers Resolved   Patient Goals and CMS Choice CMS Medicare.gov Compare Post Acute Care list provided to:: Patient Represenative (must comment) (wife) Choice offered to / list presented to : Spouse  Discharge Placement                Patient [facility] bed at:  [facility]) Patient to be transferred to facility by: [facility] Name of family member notified: spouse Patient and family notified of of transfer: 05/08/22  Discharge Plan and Services Additional resources added to the After Visit Summary for     Discharge Planning Services: CM Consult Post Acute Care Choice: Skilled Nursing Facility                               Social Determinants of Health (SDOH) Interventions SDOH Screenings   Food Insecurity: No Food Insecurity (05/01/2022)  Housing: Low Risk  (05/01/2022)  Transportation Needs: No Transportation Needs (05/01/2022)  Utilities: Not At Risk (05/01/2022)  Depression (PHQ2-9): Low Risk  (11/08/2020)  Financial Resource Strain: Low Risk  (05/01/2022)  Tobacco Use: Low Risk  (04/26/2022)     Readmission Risk Interventions    08/20/2020   11:37 AM  Readmission Risk Prevention Plan  Transportation Screening Complete  PCP or Specialist Appt within 3-5 Days  Complete  HRI or Home Care Consult Complete  Social Work Consult for Recovery Care Planning/Counseling Complete  Palliative Care Screening Not Applicable  Medication Review Oceanographer) Complete

## 2022-05-09 ENCOUNTER — Other Ambulatory Visit (HOSPITAL_COMMUNITY): Payer: Self-pay

## 2022-05-10 DIAGNOSIS — L853 Xerosis cutis: Secondary | ICD-10-CM | POA: Diagnosis not present

## 2022-05-10 DIAGNOSIS — L602 Onychogryphosis: Secondary | ICD-10-CM | POA: Diagnosis not present

## 2022-05-11 ENCOUNTER — Ambulatory Visit (HOSPITAL_COMMUNITY)
Admit: 2022-05-11 | Discharge: 2022-05-11 | Disposition: A | Discharge status: Home / Self Care | Payer: Medicare Other | Attending: Adult Health | Admitting: Adult Health

## 2022-05-11 VITALS — BP 110/80 | HR 77 | Wt 252.0 lb

## 2022-05-11 DIAGNOSIS — Z7901 Long term (current) use of anticoagulants: Secondary | ICD-10-CM | POA: Diagnosis not present

## 2022-05-11 DIAGNOSIS — Z8616 Personal history of COVID-19: Secondary | ICD-10-CM | POA: Insufficient documentation

## 2022-05-11 DIAGNOSIS — J45909 Unspecified asthma, uncomplicated: Secondary | ICD-10-CM | POA: Insufficient documentation

## 2022-05-11 DIAGNOSIS — I255 Ischemic cardiomyopathy: Secondary | ICD-10-CM | POA: Diagnosis not present

## 2022-05-11 DIAGNOSIS — Z794 Long term (current) use of insulin: Secondary | ICD-10-CM | POA: Diagnosis not present

## 2022-05-11 DIAGNOSIS — I130 Hypertensive heart and chronic kidney disease with heart failure and stage 1 through stage 4 chronic kidney disease, or unspecified chronic kidney disease: Secondary | ICD-10-CM | POA: Insufficient documentation

## 2022-05-11 DIAGNOSIS — Z7951 Long term (current) use of inhaled steroids: Secondary | ICD-10-CM | POA: Diagnosis not present

## 2022-05-11 DIAGNOSIS — I480 Paroxysmal atrial fibrillation: Secondary | ICD-10-CM

## 2022-05-11 DIAGNOSIS — Z79899 Other long term (current) drug therapy: Secondary | ICD-10-CM | POA: Insufficient documentation

## 2022-05-11 DIAGNOSIS — I080 Rheumatic disorders of both mitral and aortic valves: Secondary | ICD-10-CM | POA: Insufficient documentation

## 2022-05-11 DIAGNOSIS — I5042 Chronic combined systolic (congestive) and diastolic (congestive) heart failure: Secondary | ICD-10-CM | POA: Diagnosis not present

## 2022-05-11 DIAGNOSIS — J90 Pleural effusion, not elsewhere classified: Secondary | ICD-10-CM | POA: Insufficient documentation

## 2022-05-11 DIAGNOSIS — E1122 Type 2 diabetes mellitus with diabetic chronic kidney disease: Secondary | ICD-10-CM | POA: Insufficient documentation

## 2022-05-11 DIAGNOSIS — Z955 Presence of coronary angioplasty implant and graft: Secondary | ICD-10-CM | POA: Insufficient documentation

## 2022-05-11 DIAGNOSIS — I2510 Atherosclerotic heart disease of native coronary artery without angina pectoris: Secondary | ICD-10-CM | POA: Diagnosis not present

## 2022-05-11 DIAGNOSIS — I5030 Unspecified diastolic (congestive) heart failure: Secondary | ICD-10-CM

## 2022-05-11 DIAGNOSIS — E785 Hyperlipidemia, unspecified: Secondary | ICD-10-CM | POA: Insufficient documentation

## 2022-05-11 DIAGNOSIS — N183 Chronic kidney disease, stage 3 unspecified: Secondary | ICD-10-CM | POA: Diagnosis not present

## 2022-05-11 LAB — BASIC METABOLIC PANEL
Anion gap: 9 (ref 5–15)
BUN: 55 mg/dL — ABNORMAL HIGH (ref 8–23)
CO2: 21 mmol/L — ABNORMAL LOW (ref 22–32)
Calcium: 8.1 mg/dL — ABNORMAL LOW (ref 8.9–10.3)
Chloride: 106 mmol/L (ref 98–111)
Creatinine, Ser: 2.27 mg/dL — ABNORMAL HIGH (ref 0.61–1.24)
GFR, Estimated: 28 mL/min — ABNORMAL LOW (ref >60)
Glucose, Bld: 299 mg/dL — ABNORMAL HIGH (ref 70–99)
Potassium: 4.3 mmol/L (ref 3.5–5.1)
Sodium: 136 mmol/L (ref 135–145)

## 2022-05-11 NOTE — Patient Instructions (Addendum)
EKG done today.   Labs done today. We will contact you only if your labs are abnormal.  No medication changes were made. Please continue all current medications as prescribed.  Your physician recommends that you schedule a follow-up appointment in: 5-6 weeks with Dr. Shirlee Latch.   If you have any questions or concerns before your next appointment please send [place] a message through [place] or call our office at [phone].    TO LEAVE A MESSAGE FOR THE NURSE SELECT OPTION 2, PLEASE LEAVE A MESSAGE INCLUDING: YOUR NAME DATE OF BIRTH CALL BACK NUMBER REASON FOR CALL**this is important as we prioritize the call backs  YOU WILL RECEIVE A CALL BACK THE SAME DAY AS LONG AS YOU CALL BEFORE 4:00 PM   Do the following things EVERYDAY: Weigh yourself in the morning before breakfast. Write it down and keep it in a log. Take your medicines as prescribed Eat low salt foods--Limit salt (sodium) to 2000 mg per day.  Stay as active as you can everyday Limit all fluids for the day to less than 2 liters   At the [facility], you and your health needs are our priority. As part of our continuing mission to provide you with exceptional heart care, we have created designated Provider Care Teams. These Care Teams include your primary Cardiologist (physician) and Advanced Practice Providers (APPs- Physician Assistants and Nurse Practitioners) who all work together to provide you with the care you need, when you need it.   You may see any of the following providers on your designated Care Team at your next follow up: Dr Arvilla Meres Dr Carron Curie, NP Robbie Lis, [place] Karle Plumber, PharmD   Please be sure to bring in all your medications bottles to every appointment.

## 2022-05-11 NOTE — Progress Notes (Signed)
Advanced Heart Failure Clinic Note   PCP: [name], MD  Cardiologist: Loralie Champagne, MD   David Huynh is a 81 y.o. male with a history of CAD, chronic systolic CHF, HTN, HLD, DM, CKD 3, and asthma. Patient had EF 35-40% in 9/17, had cath with DES to RCA then repeat echo with EF up to 55-60% by 3/18.   Admitted 03/2018 with SOB x 3 weeks. No cardiology follow up in >1 year. Found to be in atrial fibrillation with RVR. EF down to 35-40%. Poor diuresis with IV lasix, so HF team consulted. He was started on amiodarone drip. PICC line placed with CVP markedly elevated and Coox marginal. Held off on inotropes with concern for making afib worse. Started on lasix drip. He started eliquis and had TEE/DCCV 03/29/18. Was converted to NSR, but shortly after went back into AF. Ranexa added. Attempted repeat DCCV 04/02/18, but was unsuccessful. EP consulted and recommended repeat LHC prior to repeat DCCV. Had [facility] 04/03/18 with PCI to proximal LCx.  He then had TEE-DCCV on 04/19/18 back to NSR. EF on TEE was 40%.  He saw Dr. Rayann Heman to discuss ablation and opted to continue amiodarone.   Echo in 3/20 showed EF 50% with diffuse hypokinesis, normal RV, mild AS and mild MR.    He was lost to followup in this office again for over a year.  In 7/21, he was admitted for a couple of days with CHF exacerbation and was treated with IV Lasix.  Echo was done in 7/21, EF back down to 25-30%, global hypokinesis, severely decreased RV systolic function with severe RV enlargement, at least moderate aortic stenosis.  He was in NSR.    TEE was done in 10/21 for closer evaluation of aortic valve, this showed EF actually up to 50% with moderate AS and moderate MR.  LHC/RHC was done, showing normal filling pressures and significant LAD disease.  The patient is now s/p DES to LAD.   He was admitted on [facility] on 08/10/20 with atrial fibrillation/RVR and CHF.  Amiodarone was started and he was cardioverted to NSR.  Echo in  3/22 showed EF 50-55% with normal RV and mild AS, mild MR.  He developed acute amiodarone lung toxicity with elevated ESR and patchy bilateral infiltrates.  Amiodarone was stopped and prednisone was started.  Oxygen saturation gradually increased over time and he was eventually able to wean off oxygen.   In 6/22, he had atrial fibrillation ablation.   In 8/22, he was admitted with COVID-19 PNA and atrial fibrillation/RVR.  He had a cardioversion back to NSR.  Echo in 8/22 showed EF 45-50%, moderate LVH, RV normal, mild MR, moderate AS.   CT chest in 9/22 showed fibrosis pattern still present but improved.   Lost to follow up in the HF clinic.   Admitted 04/26/22 with A fib RVR and A/C HFrEF. Hospital course complicated by UTI and  influenza A. Echo EF 40-45%, RV mildly reduced, moderate AS . Diuresed with IV lasix and transitioned to torsemide 20 mg every other day. Converted to SR. Continued on Toprol XL. Discharged  to [facility].   Today he returns for HF follow up.Overall feeling fine. Denies SOB/PND/Orthopnea. Has foley. Appetite ok. No fever or chills.Working with therapy at [facility]. Medications provided at Rehab. Currently at [facility].   Labs (12/19): K 4.2, creatinine 1.35, LFTs normal Las (7/21): LDL 198, K 3.7, creatinine 1.75 Labs (8/21): K 4, creatinine 1.48, BNP 593 Labs (  10/21): K 3.9, creatinine 1.28, LDL 230 (not taking statin), TSH normal, LFTs normal Labs (11/21): K 3.8, creatinine 1.34 Labs (4/22): K 6, creatinine 2.41 Labs (5/22): hgb 12.8, K 4, creatinine 1.52 Labs (8/22): hgb 12.2, K 4.2, creatinine 1.44 Labs (9/22): K 4.1, creatinine 1.62 Labs 12/25: K 3.7 Creatinine 2.9  SH: No tobacco or drug use. Rare ETOH use. Lives at [place] with his wife. 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  FH: No family hx of CAD or CHF.   Review of systems complete and found to be negative unless listed in HPI.   PMH: 1. CKD: Stage 3.  2. Atrial fibrillation: Paroxysmal =>  persistent, poorly tolerated with history of tachycardia-mediated CMP.   - DCCV x 2 in 11/19 and once in 12/19.  - DCCV to NSR in 3/22 on amiodarone, developed amiodarone lung toxicity and amiodarone stopped.  Went back into atrial fibrillation.  - Atrial fibrillation ablation 6/22.  - DCCV to NSR 8/22.  3. Type II diabetes.  4. HTN 5. Hyperlipidemia.  6. CAD: DES to mid RCA in 2017.   - LHC (11/19): 50% mLAD, 50% dLAD, 80-85% proximal LCx stenosis, OM1 small vessel with 90% stenois. PCI to proximal LCx.  - LHC (10/21): 80% pLAD, 70% p/mLAD, 50% m/d LAD, 80% superior branch OM2, 60% inferior branch OM2, 50% mPDA. PCI with DES to LAD.  7. Chronic systolic CHF: Suspect this was primarily tachy-cardia mediated.  - Echo (11/19): EF 35-40%, moderate to severely decreased RV systolic function.  - TEE (12/19): EF 40%, diffuse hypokinesis, normal RV size and systolic function.  - Echo (3/20): EF 50%, normal RV, mild MR, mild AS with mean gradient 14 mmHg. - Echo (7/21): EF 25-30%, global hypokinesis, severely decreased RV systolic function with severe RV enlargement, at least moderate aortic stenosis.   - TEE (10/21): EF 50%, anterior hypokinesis, basal inferior hypokinesis, normal RV, moderate AS with mean gradient 22 mmHg and AVA 1.18 cm^2, moderate MR with ERO 0.32 cm^2.  - Echo (3/22): EF 50-55% with normal RV and mild AS, mild MR - RHC (10/21): mean RA 2, PA 29/4, mean PCWP 6, CI 2.66, easily crossed AoV with mean gradient 15 mmHg.  - Echo (8/22): EF 45-50%, moderate LVH, RV normal, mild MR, moderate AS.  8. Aortic stenosis: Mild by 3/20 echo. 7/21 echo with at least moderate AS.  - Moderate on 8/22 echo.  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  - CT chest (9/22): Fibrosis present but improved.  10. COVID-19 PNA (8/22)  Current Outpatient Medications  Medication Sig Dispense Refill   acetaminophen (TYLENOL) 500 MG tablet Take 1,000 mg by mouth every 6 (six) hours as  needed for moderate pain or headache.     ADVAIR DISKUS 100-50 MCG/ACT AEPB INHALE 1 PUFF INTO THE LUNGS TWICE DAILY (Patient taking differently: Inhale 1 puff into the lungs 2 (two) times daily.) 60 each 5   albuterol (VENTOLIN HFA) 108 (90 Base) MCG/ACT inhaler Inhale 2 puffs into the lungs every 6 (six) hours as needed for wheezing or shortness of breath. 8.5 g 1   apixaban (ELIQUIS) 2.5 MG TABS tablet Take 1 tablet (2.5 mg total) by mouth 2 (two) times daily. TAKE 1 TABLET(5 MG) BY MOUTH TWICE DAILY     cholecalciferol (VITAMIN D) 1000 units tablet Take 1 tablet (1,000 Units total) by mouth daily. 30 tablet 0   ezetimibe (ZETIA) 10 MG tablet TAKE 1 TABLET(10 MG) BY MOUTH DAILY (Patient taking  differently: Take 10 mg by mouth daily.) 90 tablet 0   feeding supplement, GLUCERNA SHAKE, (GLUCERNA SHAKE) LIQD Take 237 mLs by mouth 3 (three) times daily between meals. 10000 mL 0   glucose blood test strip Use as instructed 100 each 12   HUMALOG KWIKPEN 100 UNIT/ML KwikPen USE AS DIRECTED. MAX DOSE OF 15 UNITS 15 mL 1   icosapent Ethyl (VASCEPA) 1 g capsule TAKE 2 CAPSULES(2 GRAMS) BY MOUTH TWICE DAILY (Patient taking differently: Take 2 g by mouth 2 (two) times daily.) 112 capsule 0   insulin glargine (LANTUS SOLOSTAR) 100 UNIT/ML Solostar Pen Inject 6 Units into the skin at bedtime. 15 mL 0   Insulin Pen Needle (PEN NEEDLES) 32G X 4 MM MISC Inject 1 Device into the skin in the morning, at noon, in the evening, and at bedtime. Use 1 at bedtime with Lantus.  Dx code: E11.9 400 each 1   loratadine (CLARITIN) 10 MG tablet Take 1 tablet (10 mg total) by mouth daily.     menthol-cetylpyridinium (CEPACOL) 3 MG lozenge Take 1 lozenge (3 mg total) by mouth as needed for sore throat. 100 tablet 12   metoprolol succinate (TOPROL-XL) 25 MG 24 hr tablet Take 3 tablets (75 mg total) by mouth 2 (two) times daily.     nitroGLYCERIN (NITROSTAT) 0.4 MG SL tablet Place 1 tablet (0.4 mg total) under the tongue every 5  (five) minutes as needed for chest pain. 30 tablet 0   potassium chloride SA (KLOR-CON M) 20 MEQ tablet Take 1 tablet (20 mEq total) by mouth every other day.     predniSONE (DELTASONE) 20 MG tablet Take 20 Mg for 2 days followed by 10 Mg for 2 days and then stop 6 tablet 0   rosuvastatin (CRESTOR) 10 MG tablet Take 1 tablet (10 mg total) by mouth daily. 30 tablet 0   tamsulosin (FLOMAX) 0.4 MG CAPS capsule Take 1 capsule (0.4 mg total) by mouth daily after breakfast. 30 capsule 0   torsemide (DEMADEX) 20 MG tablet Take 1 tablet (20 mg total) by mouth every other day. 30 tablet 0   Current Facility-Administered Medications  Medication Dose Route Frequency Provider Last Rate Last Admin   insulin starter kit- pen needles (English) 1 kit  1 kit Other Once [name], MD        Allergies  Allergen Reactions   Amiodarone Shortness Of Breath    Pulmonary toxicity   BP 110/80   Pulse 77   Wt 114.3 kg (252 lb)   SpO2 93%   BMI 34.18 kg/m   Wt Readings from Last 3 Encounters:  05/11/22 114.3 kg (252 lb)  05/08/22 113.9 kg (251 lb 1.7 oz)  09/22/21 124.3 kg (274 lb)   PHYSICAL EXAM: General:  Arrived in wheel chair. No resp difficulty HEENT: normal Neck: supple. no JVD. Carotids 2+ bilat; no bruits. No lymphadenopathy or thryomegaly appreciated. Cor: PMI nondisplaced. Regular rate & rhythm. No rubs, gallops or murmurs. Lungs: clear Abdomen: soft, nontender, nondistended. No hepatosplenomegaly. No bruits or masses. Good bowel sounds. Extremities: no cyanosis, clubbing, rash, edema Neuro: alert & orientedx3, cranial nerves grossly intact. moves all 4 extremities w/o difficulty. Affect pleasant GU: foley   EKG: SR 80 bpm personally checked.   ASSESSMENT & PLAN:  I reviewed most recent discharge summary 05/08/22.   1. Chronic systolic=>diastolic CHF: Prior ischemic cardiomyopathy, improved after PCI in 2017.  Echo 03/2018 with EF back down to 35-40% and moderate-severe RV  dysfunction.  This was in the setting of atrial fibrillation with RVR. EF 40% on TEE 04/2018.  Echo was in 3/20, showing EF up to 50%.  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  Echo in 7/21 with EF 25-30%, severe RV dysfunction.  TEE in 10/21, however, showed EF up to 50%.  He had PCI to LAD in 10/21. Echo in 3/22 showed stable EF 50-55% with normal RV.   Echo in 8/22 with EF 45-50%, moderate LVH, normal RV, moderate AS.  Readmitted with marked volume overload and atrial tach. Echo 04/2022 EF 40-45%, RV mildly reduced, moderate AS.  NYHA II. Volume status stable.  Continue torsemide 20 mg daily.   - No SGLT2i with UTI.   2. Atrial fibrillation: As above, concerned for possible tachy-mediated CMP.  Had successful DC-CV on 03/29/18 but then back in atrial fibrillation. Ranolazine added, but failed DCCV again 04/02/18. DCCV 04/19/18 was successful.  AF with RVR at 3/22 admission, amiodarone restarted and he was cardioverted to NSR, but he developed amiodarone lung toxicity.  Amiodarone was stopped. Now s/p atrial fibrillation ablation in 6/22.   During recent admission, he was in atrial tachycardia with RVR, not sure how long. Suspect this triggered recurrent CHF.  He does not appear to still be on ranolazine at home.  - In SR today. No bleeding issues.  - Continue Toprol Xl 75 mg twice a day.  - No amiodarone with history of lung toxicity.   - Continue Eliquis 5 mg bid.   3. CKD: Stage 3.  BMET today.  4. CAD: History of DES to RCA in 9/17.  Underwent LHC 04/03/18 with PCI to prox left circ. LHC in 10/21 with PCI to LAD.   - He is on Eliquis, so no ASA.   - Continue statin.  5. Aortic stenosis: Moderate on 8/22 echo.  6. Mitral regurgitation: Mild on 8/22 echo.  7. 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   8. Pleural Effusion 04/2022 Thoracentesis x 2 on left and x 1 on right this admission. Transudates, due to CHF/nephrotic syndrome.   Follow up with Dr  Aundra Dubin in 6 weeks   Darrick Grinder, NP 05/11/22

## 2022-05-12 DIAGNOSIS — E669 Obesity, unspecified: Secondary | ICD-10-CM | POA: Diagnosis not present

## 2022-05-12 DIAGNOSIS — I10 Essential (primary) hypertension: Secondary | ICD-10-CM | POA: Diagnosis not present

## 2022-05-12 DIAGNOSIS — E1142 Type 2 diabetes mellitus with diabetic polyneuropathy: Secondary | ICD-10-CM | POA: Diagnosis not present

## 2022-05-12 DIAGNOSIS — E785 Hyperlipidemia, unspecified: Secondary | ICD-10-CM | POA: Diagnosis not present

## 2022-05-19 DIAGNOSIS — E1122 Type 2 diabetes mellitus with diabetic chronic kidney disease: Secondary | ICD-10-CM

## 2022-05-19 DIAGNOSIS — E669 Obesity, unspecified: Secondary | ICD-10-CM | POA: Diagnosis not present

## 2022-05-19 DIAGNOSIS — I4821 Permanent atrial fibrillation: Secondary | ICD-10-CM

## 2022-05-19 DIAGNOSIS — E785 Hyperlipidemia, unspecified: Secondary | ICD-10-CM | POA: Diagnosis not present

## 2022-05-19 DIAGNOSIS — I10 Essential (primary) hypertension: Secondary | ICD-10-CM

## 2022-05-19 DIAGNOSIS — E1142 Type 2 diabetes mellitus with diabetic polyneuropathy: Secondary | ICD-10-CM | POA: Diagnosis not present

## 2022-05-19 DIAGNOSIS — R339 Retention of urine, unspecified: Secondary | ICD-10-CM

## 2022-05-19 DIAGNOSIS — R531 Weakness: Secondary | ICD-10-CM

## 2022-05-21 ENCOUNTER — Other Ambulatory Visit: Payer: Self-pay | Admitting: Internal Medicine

## 2022-05-22 ENCOUNTER — Ambulatory Visit: Payer: Medicare Other | Admitting: Cardiovascular Disease

## 2022-05-24 ENCOUNTER — Emergency Department (HOSPITAL_COMMUNITY)
Admission: EM | Admit: 2022-05-24 | Discharge: 2022-05-24 | Disposition: A | Discharge status: Skilled Nursing Facility | Payer: Medicare Other | Attending: Emergency Medicine | Admitting: Emergency Medicine

## 2022-05-24 DIAGNOSIS — R338 Other retention of urine: Secondary | ICD-10-CM | POA: Diagnosis not present

## 2022-05-24 DIAGNOSIS — Z7984 Long term (current) use of oral hypoglycemic drugs: Secondary | ICD-10-CM | POA: Diagnosis not present

## 2022-05-24 DIAGNOSIS — R944 Abnormal results of kidney function studies: Secondary | ICD-10-CM | POA: Insufficient documentation

## 2022-05-24 DIAGNOSIS — D649 Anemia, unspecified: Secondary | ICD-10-CM | POA: Insufficient documentation

## 2022-05-24 DIAGNOSIS — Z7901 Long term (current) use of anticoagulants: Secondary | ICD-10-CM | POA: Insufficient documentation

## 2022-05-24 DIAGNOSIS — I509 Heart failure, unspecified: Secondary | ICD-10-CM | POA: Insufficient documentation

## 2022-05-24 DIAGNOSIS — R42 Dizziness and giddiness: Secondary | ICD-10-CM

## 2022-05-24 DIAGNOSIS — Z794 Long term (current) use of insulin: Secondary | ICD-10-CM | POA: Diagnosis not present

## 2022-05-24 DIAGNOSIS — I959 Hypotension, unspecified: Secondary | ICD-10-CM | POA: Diagnosis not present

## 2022-05-24 DIAGNOSIS — R000 Tachycardia, unspecified: Secondary | ICD-10-CM | POA: Diagnosis not present

## 2022-05-24 DIAGNOSIS — R609 Edema, unspecified: Secondary | ICD-10-CM | POA: Diagnosis not present

## 2022-05-24 LAB — CBC WITH DIFFERENTIAL/PLATELET
Abs Immature Granulocytes: 0.04 10*3/uL (ref 0.00–0.07)
Basophils Absolute: 0.1 10*3/uL (ref 0.0–0.1)
Basophils Relative: 1 %
Eosinophils Absolute: 0.2 10*3/uL (ref 0.0–0.5)
Eosinophils Relative: 2 %
HCT: 40.1 % (ref 39.0–52.0)
Hemoglobin: 12.8 g/dL — ABNORMAL LOW (ref 13.0–17.0)
Immature Granulocytes: 1 %
Lymphocytes Relative: 20 %
Lymphs Abs: 1.8 10*3/uL (ref 0.7–4.0)
MCH: 29.4 pg (ref 26.0–34.0)
MCHC: 31.9 g/dL (ref 30.0–36.0)
MCV: 92.2 fL (ref 80.0–100.0)
Monocytes Absolute: 0.5 10*3/uL (ref 0.1–1.0)
Monocytes Relative: 6 %
Neutro Abs: 6 10*3/uL (ref 1.7–7.7)
Neutrophils Relative %: 70 %
Platelets: 419 10*3/uL — ABNORMAL HIGH (ref 150–400)
RBC: 4.35 MIL/uL (ref 4.22–5.81)
RDW: 13.9 % (ref 11.5–15.5)
WBC: 8.6 10*3/uL (ref 4.0–10.5)
nRBC: 0 % (ref 0.0–0.2)

## 2022-05-24 LAB — COMPREHENSIVE METABOLIC PANEL
ALT: 24 U/L (ref 0–44)
AST: 24 U/L (ref 15–41)
Albumin: 2.2 g/dL — ABNORMAL LOW (ref 3.5–5.0)
Alkaline Phosphatase: 90 U/L (ref 38–126)
Anion gap: 8 (ref 5–15)
BUN: 23 mg/dL (ref 8–23)
CO2: 23 mmol/L (ref 22–32)
Calcium: 8.3 mg/dL — ABNORMAL LOW (ref 8.9–10.3)
Chloride: 107 mmol/L (ref 98–111)
Creatinine, Ser: 2.24 mg/dL — ABNORMAL HIGH (ref 0.61–1.24)
GFR, Estimated: 29 mL/min — ABNORMAL LOW (ref >60)
Glucose, Bld: 124 mg/dL — ABNORMAL HIGH (ref 70–99)
Potassium: 3.4 mmol/L — ABNORMAL LOW (ref 3.5–5.1)
Sodium: 138 mmol/L (ref 135–145)
Total Bilirubin: 0.5 mg/dL (ref 0.3–1.2)
Total Protein: 6 g/dL — ABNORMAL LOW (ref 6.5–8.1)

## 2022-05-24 MED ORDER — METOPROLOL SUCCINATE ER 25 MG PO TB24
25.0000 mg | ORAL_TABLET | Freq: Once | ORAL | Status: AC
  Administered 2022-05-24: 25 mg via ORAL
  Filled 2022-05-24: qty 1

## 2022-05-24 NOTE — ED Provider Notes (Signed)
[place] DEPT Provider Note   CSN: [id number] Arrival date & time: 05/24/22  4765     History  Chief Complaint  Patient presents with   Dizziness    David Huynh is a 82 y.o. male.  Patient has a history of atrial fibs, congestive heart failure, and pulmonary disease.  He was at the urologist office and was a little dizzy and his blood pressure was low.  Patient feels fine now  The history is provided by the patient. No language interpreter was used.  Dizziness Quality:  Lightheadedness Severity:  Mild Onset quality:  Sudden Timing:  Intermittent Progression:  Resolved Context: not when bending over   Relieved by:  Nothing Worsened by:  Nothing Ineffective treatments:  None tried Associated symptoms: no blood in stool, no chest pain, no diarrhea and no headaches   Risk factors: no anemia        Home Medications Prior to Admission medications   Medication Sig Start Date End Date Taking? Authorizing Provider  acetaminophen (TYLENOL) 500 MG tablet Take 1,000 mg by mouth every 6 (six) hours as needed for moderate pain or headache.    [provider]  ADVAIR DISKUS 100-50 MCG/ACT AEPB INHALE 1 PUFF INTO THE LUNGS TWICE DAILY Patient taking differently: Inhale 1 puff into the lungs 2 (two) times daily. 02/20/22   [name], MD  albuterol (VENTOLIN HFA) 108 (90 Base) MCG/ACT inhaler Inhale 2 puffs into the lungs every 6 (six) hours as needed for wheezing or shortness of breath. 02/27/22   [name], MD  apixaban (ELIQUIS) 2.5 MG TABS tablet Take 1 tablet (2.5 mg total) by mouth 2 (two) times daily. TAKE 1 TABLET(5 MG) BY MOUTH TWICE DAILY 05/05/22   Domenic Polite, MD  cholecalciferol (VITAMIN D) 1000 units tablet Take 1 tablet (1,000 Units total) by mouth daily. 12/09/19   Amin, Jeanella Flattery, MD  ezetimibe (ZETIA) 10 MG tablet TAKE 1 TABLET(10 MG) BY MOUTH DAILY Patient taking differently: Take 10 mg by mouth daily.  08/03/21   Larey Dresser, MD  feeding supplement, GLUCERNA SHAKE, (GLUCERNA SHAKE) LIQD Take 237 mLs by mouth 3 (three) times daily between meals. 05/08/22   Lavina Hamman, MD  glucose blood test strip Use as instructed 09/03/20   [name], MD  HUMALOG KWIKPEN 100 UNIT/ML KwikPen USE AS DIRECTED. MAX DOSE OF 15 UNITS 01/02/22   Shamleffer, Melanie Crazier, MD  icosapent Ethyl (VASCEPA) 1 g capsule TAKE 2 CAPSULES(2 GRAMS) BY MOUTH TWICE DAILY Patient taking differently: Take 2 g by mouth 2 (two) times daily. 09/27/21   Larey Dresser, MD  insulin glargine (LANTUS SOLOSTAR) 100 UNIT/ML Solostar Pen Inject 6 Units into the skin at bedtime. 05/05/22   Domenic Polite, MD  Insulin Pen Needle (PEN NEEDLES) 32G X 4 MM MISC Inject 1 Device into the skin in the morning, at noon, in the evening, and at bedtime. Use 1 at bedtime with Lantus.  Dx code: E11.9 11/30/20   Shamleffer, Melanie Crazier, MD  loratadine (CLARITIN) 10 MG tablet Take 1 tablet (10 mg total) by mouth daily. 05/06/22   Domenic Polite, MD  menthol-cetylpyridinium (CEPACOL) 3 MG lozenge Take 1 lozenge (3 mg total) by mouth as needed for sore throat. 05/08/22   Lavina Hamman, MD  metFORMIN (GLUCOPHAGE) 1000 MG tablet TAKE 1 TABLET(1000 MG) BY MOUTH DAILY WITH BREAKFAST 05/22/22   Shamleffer, Melanie Crazier, MD  metoprolol succinate (TOPROL-XL) 25 MG 24 hr tablet  Take 3 tablets (75 mg total) by mouth 2 (two) times daily. 05/05/22   Zannie Cove, MD  nitroGLYCERIN (NITROSTAT) 0.4 MG SL tablet Place 1 tablet (0.4 mg total) under the tongue every 5 (five) minutes as needed for chest pain. 12/09/19   Amin, Loura Halt, MD  potassium chloride SA (KLOR-CON M) 20 MEQ tablet Take 1 tablet (20 mEq total) by mouth every other day. 05/06/22   Zannie Cove, MD  predniSONE (DELTASONE) 20 MG tablet Take 20 Mg for 2 days followed by 10 Mg for 2 days and then stop 05/08/22   Rolly Salter, MD  rosuvastatin (CRESTOR) 10 MG tablet Take 1 tablet  (10 mg total) by mouth daily. 05/09/22   Rolly Salter, MD  tamsulosin (FLOMAX) 0.4 MG CAPS capsule Take 1 capsule (0.4 mg total) by mouth daily after breakfast. 05/08/22   Rolly Salter, MD  torsemide (DEMADEX) 20 MG tablet Take 1 tablet (20 mg total) by mouth every other day. 05/09/22   Rolly Salter, MD      Allergies    Amiodarone    Review of Systems   Review of Systems  Constitutional:  Negative for appetite change and fatigue.  HENT:  Negative for congestion, ear discharge and sinus pressure.   Eyes:  Negative for discharge.  Respiratory:  Negative for cough.   Cardiovascular:  Negative for chest pain.  Gastrointestinal:  Negative for abdominal pain, blood in stool and diarrhea.  Genitourinary:  Negative for frequency and hematuria.  Musculoskeletal:  Negative for back pain.  Skin:  Negative for rash.  Neurological:  Positive for dizziness. Negative for seizures and headaches.  Psychiatric/Behavioral:  Negative for hallucinations.     Physical Exam Updated Vital Signs BP 115/62   Pulse (!) 58   Temp 98.3 F (36.8 C) (Oral)   Resp (!) 0   SpO2 (!) 89%  Physical Exam Vitals and nursing note reviewed.  Constitutional:      Appearance: He is well-developed.  HENT:     Head: Normocephalic.     Nose: Nose normal.  Eyes:     General: No scleral icterus.    Conjunctiva/sclera: Conjunctivae normal.  Neck:     Thyroid: No thyromegaly.  Cardiovascular:     Rate and Rhythm: Normal rate and regular rhythm.     Heart sounds: No murmur heard.    No friction rub. No gallop.  Pulmonary:     Breath sounds: No stridor. No wheezing or rales.  Chest:     Chest wall: No tenderness.  Abdominal:     General: There is no distension.     Tenderness: There is no abdominal tenderness. There is no rebound.  Musculoskeletal:        General: Normal range of motion.     Cervical back: Neck supple.  Lymphadenopathy:     Cervical: No cervical adenopathy.  Skin:    Findings: No  erythema or rash.  Neurological:     Mental Status: He is alert and oriented to person, place, and time.     Motor: No abnormal muscle tone.     Coordination: Coordination normal.  Psychiatric:        Behavior: Behavior normal.     ED Results / Procedures / Treatments   Labs (all labs ordered are listed, but only abnormal results are displayed) Labs Reviewed  CBC WITH DIFFERENTIAL/PLATELET - Abnormal; Notable for the following components:      Result Value   Hemoglobin 12.8 (*)  Platelets 419 (*)    All other components within normal limits  COMPREHENSIVE METABOLIC PANEL - Abnormal; Notable for the following components:   Potassium 3.4 (*)    Glucose, Bld 124 (*)    Creatinine, Ser 2.24 (*)    Calcium 8.3 (*)    Total Protein 6.0 (*)    Albumin 2.2 (*)    GFR, Estimated 29 (*)    All other components within normal limits    EKG None  Radiology No results found.  Procedures Procedures    Medications Ordered in ED Medications  metoprolol succinate (TOPROL-XL) 24 hr tablet 25 mg (has no administration in time range)    ED Course/ Medical Decision Making/ A&P                           Medical Decision Making Amount and/or Complexity of Data Reviewed Labs: ordered. ECG/medicine tests: ordered.  Risk Prescription drug management.  This patient presents to the ED for concern of dizziness, this involves an extensive number of treatment options, and is a complaint that carries with it a high risk of complications and morbidity.  The differential diagnosis includes MI, hypotension   Co morbidities that complicate the patient evaluation  Atrial fibs, congestive heart failure, pulmonary disease   Additional history obtained:  Additional history obtained from patient External records from outside source obtained and reviewed including hospital records   Lab Tests:  I Ordered, and personally interpreted labs.  The pertinent results include: Sodium 138,  creatinine 2.2 which is stable from, hemoglobin 12.8   Imaging Studies ordered: No imaging  Cardiac Monitoring: / EKG:  The patient was maintained on a cardiac monitor.  I personally viewed and interpreted the cardiac monitored which showed an underlying rhythm of: Atrial fibs   Consultations Obtained:  No consultant  Problem List / ED Course / Critical interventions / Medication management  Atrial fibs, congestive heart failure, weakness I ordered medication including beta-blocker for atrial fibs Reevaluation of the patient after these medicines showed that the patient improved I have reviewed the patients home medicines and have made adjustments as needed   Social Determinants of Health:  None   Test / Admission - Considered:  None  Patient with hypotension and dizziness in the urologist office.  He has been normotensive since he has been here, slightly tachycardic, but he did not have his metoprolol today.  We gave him his metoprolol. his dizziness has improved.  Patient will go back to rehab and follow-up with the urologist later        Final Clinical Impression(s) / ED Diagnoses Final diagnoses:  Dizziness    Rx / DC Orders ED Discharge Orders     None         Milton Ferguson, MD 05/27/22 [phone]

## 2022-05-24 NOTE — ED Notes (Signed)
Attempted to contact [facility] x 2 [facility] notified of transport

## 2022-05-24 NOTE — ED Triage Notes (Addendum)
C/o dizziness P/U from urology office by [facility]. Orthostatic hypotension. Admits decline in eating but drinking well. Onset today. Urinary cath in place. 100/50 130 AFIb with hx. DC from hospiital recently to [facility]

## 2022-05-24 NOTE — ED Notes (Signed)
No s/s of distress noted upon discharge. Left off unit via stretch with  [facility]

## 2022-05-24 NOTE — Discharge Instructions (Signed)
Return to the rehab center.  Follow-up with urologist at a later date about your catheter

## 2022-05-25 ENCOUNTER — Encounter (HOSPITAL_COMMUNITY): Payer: Medicare Other

## 2022-05-26 DIAGNOSIS — Z960 Presence of urogenital implants: Secondary | ICD-10-CM | POA: Diagnosis not present

## 2022-05-26 DIAGNOSIS — R339 Retention of urine, unspecified: Secondary | ICD-10-CM | POA: Diagnosis not present

## 2022-05-26 DIAGNOSIS — I959 Hypotension, unspecified: Secondary | ICD-10-CM | POA: Diagnosis not present

## 2022-06-02 DIAGNOSIS — R338 Other retention of urine: Secondary | ICD-10-CM | POA: Diagnosis not present

## 2022-06-05 ENCOUNTER — Telehealth: Payer: Self-pay

## 2022-06-05 ENCOUNTER — Other Ambulatory Visit (HOSPITAL_COMMUNITY): Payer: Self-pay | Admitting: Family Medicine

## 2022-06-05 NOTE — Telephone Encounter (Signed)
     Patient  visit on 1/10  at [place]  Have you been able to follow up with your primary care physician? Yes   The patient was or was not able to obtain any needed medicine or equipment. Yes   Are there diet recommendations that you are having difficulty following? Na   Patient expresses understanding of discharge instructions and education provided has no other needs at this time.  Yes      Lynda Capistran [facility] Care Guide, [place], Care Management  [phone] [address], [place], [place] [address] Phone: [phone] Email: [email or web address]    

## 2022-06-07 ENCOUNTER — Other Ambulatory Visit: Payer: Self-pay | Admitting: Family Medicine

## 2022-06-08 DIAGNOSIS — E118 Type 2 diabetes mellitus with unspecified complications: Secondary | ICD-10-CM | POA: Diagnosis not present

## 2022-06-08 DIAGNOSIS — R42 Dizziness and giddiness: Secondary | ICD-10-CM | POA: Diagnosis not present

## 2022-06-09 DIAGNOSIS — E1122 Type 2 diabetes mellitus with diabetic chronic kidney disease: Secondary | ICD-10-CM

## 2022-06-09 DIAGNOSIS — D649 Anemia, unspecified: Secondary | ICD-10-CM

## 2022-06-13 DIAGNOSIS — R338 Other retention of urine: Secondary | ICD-10-CM | POA: Diagnosis not present

## 2022-06-19 DIAGNOSIS — R2689 Other abnormalities of gait and mobility: Secondary | ICD-10-CM

## 2022-06-19 DIAGNOSIS — Z9181 History of falling: Secondary | ICD-10-CM

## 2022-06-19 DIAGNOSIS — M6281 Muscle weakness (generalized): Secondary | ICD-10-CM

## 2022-06-23 ENCOUNTER — Encounter (HOSPITAL_COMMUNITY): Payer: Medicare Other | Admitting: Cardiology

## 2022-06-23 ENCOUNTER — Encounter (HOSPITAL_COMMUNITY): Payer: Self-pay

## 2022-06-27 ENCOUNTER — Ambulatory Visit: Payer: Medicare Other | Admitting: Cardiovascular Disease

## 2022-06-28 DIAGNOSIS — R338 Other retention of urine: Secondary | ICD-10-CM | POA: Diagnosis not present

## 2022-06-28 DIAGNOSIS — N401 Enlarged prostate with lower urinary tract symptoms: Secondary | ICD-10-CM | POA: Diagnosis not present

## 2022-06-29 ENCOUNTER — Other Ambulatory Visit: Payer: Self-pay | Admitting: Family Medicine

## 2022-07-07 ENCOUNTER — Other Ambulatory Visit: Payer: Self-pay | Admitting: Internal Medicine

## 2022-07-11 ENCOUNTER — Telehealth: Payer: Self-pay

## 2022-07-11 NOTE — Patient Outreach (Signed)
  Care Coordination   07/11/2022 Name: David Huynh MRN: [id number] DOB: 1940-12-31   Care Coordination Outreach Attempts:  A third unsuccessful outreach was attempted today to offer the patient with information about available care coordination services as a benefit of their health plan.   Follow Up Plan:  No further outreach attempts will be made at this time. We have been unable to contact the patient to offer or enroll patient in care coordination services  Encounter Outcome:  No Answer   Care Coordination Interventions:  No, not indicated    Thea Silversmith, RN, MSN, BSN, [place] Coordinator [phone]

## 2022-07-26 ENCOUNTER — Ambulatory Visit: Payer: Medicare Other | Admitting: Internal Medicine

## 2022-07-27 ENCOUNTER — Ambulatory Visit: Payer: Medicare Other | Admitting: Cardiovascular Disease

## 2022-08-01 DIAGNOSIS — Z7189 Other specified counseling: Secondary | ICD-10-CM

## 2022-08-07 DIAGNOSIS — I469 Cardiac arrest, cause unspecified: Secondary | ICD-10-CM | POA: Diagnosis not present

## 2022-08-31 ENCOUNTER — Ambulatory Visit: Payer: Medicare Other | Admitting: Cardiovascular Disease

## 2022-10-06 ENCOUNTER — Ambulatory Visit: Payer: Medicare Other | Admitting: Internal Medicine
